# Patient Record
Sex: Female | Born: 1955
Health system: Southern US, Community
[De-identification: ages and names within clinical notes are randomized; demographics above are authoritative.]

## PROBLEM LIST (undated history)

## (undated) ENCOUNTER — Emergency Department: Discharge status: Absent without leave | Payer: Self-pay

## (undated) DIAGNOSIS — G43711 Chronic migraine without aura, intractable, with status migrainosus: Secondary | ICD-10-CM

## (undated) DIAGNOSIS — I447 Left bundle-branch block, unspecified: Secondary | ICD-10-CM

## (undated) DIAGNOSIS — I82409 Acute embolism and thrombosis of unspecified deep veins of unspecified lower extremity: Secondary | ICD-10-CM

## (undated) DIAGNOSIS — Z8489 Family history of other specified conditions: Secondary | ICD-10-CM

## (undated) DIAGNOSIS — IMO0001 Reserved for inherently not codable concepts without codable children: Secondary | ICD-10-CM

## (undated) DIAGNOSIS — G709 Myoneural disorder, unspecified: Secondary | ICD-10-CM

## (undated) DIAGNOSIS — I2692 Saddle embolus of pulmonary artery without acute cor pulmonale: Secondary | ICD-10-CM

## (undated) DIAGNOSIS — I10 Essential (primary) hypertension: Secondary | ICD-10-CM

## (undated) DIAGNOSIS — F419 Anxiety disorder, unspecified: Secondary | ICD-10-CM

## (undated) DIAGNOSIS — K649 Unspecified hemorrhoids: Secondary | ICD-10-CM

## (undated) DIAGNOSIS — F32A Depression, unspecified: Secondary | ICD-10-CM

## (undated) DIAGNOSIS — F329 Major depressive disorder, single episode, unspecified: Secondary | ICD-10-CM

## (undated) DIAGNOSIS — E669 Obesity, unspecified: Secondary | ICD-10-CM

## (undated) DIAGNOSIS — Z9889 Other specified postprocedural states: Secondary | ICD-10-CM

## (undated) DIAGNOSIS — T7840XA Allergy, unspecified, initial encounter: Secondary | ICD-10-CM

## (undated) DIAGNOSIS — Z9109 Other allergy status, other than to drugs and biological substances: Secondary | ICD-10-CM

## (undated) DIAGNOSIS — J449 Chronic obstructive pulmonary disease, unspecified: Secondary | ICD-10-CM

## (undated) DIAGNOSIS — J189 Pneumonia, unspecified organism: Secondary | ICD-10-CM

## (undated) DIAGNOSIS — Z5189 Encounter for other specified aftercare: Secondary | ICD-10-CM

## (undated) DIAGNOSIS — M1990 Unspecified osteoarthritis, unspecified site: Secondary | ICD-10-CM

## (undated) DIAGNOSIS — S0990XA Unspecified injury of head, initial encounter: Secondary | ICD-10-CM

## (undated) DIAGNOSIS — I5022 Chronic systolic (congestive) heart failure: Secondary | ICD-10-CM

## (undated) DIAGNOSIS — K219 Gastro-esophageal reflux disease without esophagitis: Secondary | ICD-10-CM

## (undated) DIAGNOSIS — R55 Syncope and collapse: Secondary | ICD-10-CM

## (undated) DIAGNOSIS — C50919 Malignant neoplasm of unspecified site of unspecified female breast: Secondary | ICD-10-CM

## (undated) DIAGNOSIS — C50912 Malignant neoplasm of unspecified site of left female breast: Secondary | ICD-10-CM

## (undated) DIAGNOSIS — I951 Orthostatic hypotension: Secondary | ICD-10-CM

## (undated) HISTORY — PX: APPENDECTOMY: SHX54

## (undated) HISTORY — PX: TUBAL LIGATION: SHX77

## (undated) HISTORY — DX: Chronic obstructive pulmonary disease, unspecified: J44.9

## (undated) HISTORY — DX: Chronic migraine without aura, intractable, with status migrainosus: G43.711

## (undated) HISTORY — DX: Unspecified osteoarthritis, unspecified site: M19.90

## (undated) HISTORY — DX: Myoneural disorder, unspecified: G70.9

## (undated) HISTORY — DX: Orthostatic hypotension: I95.1

## (undated) HISTORY — DX: Allergy, unspecified, initial encounter: T78.40XA

## (undated) HISTORY — PX: CARPAL TUNNEL RELEASE: SHX101

## (undated) HISTORY — PX: CHOLECYSTECTOMY: SHX55

## (undated) HISTORY — PX: KNEE CARTILAGE SURGERY: SHX688

## (undated) HISTORY — PX: TONSILLECTOMY: SUR1361

## (undated) HISTORY — DX: Syncope and collapse: R55

## (undated) HISTORY — DX: Other specified postprocedural states: Z98.890

## (undated) HISTORY — PX: ANAL FISSURE REPAIR: SHX2312

---

## 1977-09-05 DIAGNOSIS — IMO0001 Reserved for inherently not codable concepts without codable children: Secondary | ICD-10-CM

## 1977-09-05 DIAGNOSIS — Z5189 Encounter for other specified aftercare: Secondary | ICD-10-CM

## 1977-09-05 HISTORY — DX: Reserved for inherently not codable concepts without codable children: IMO0001

## 1977-09-05 HISTORY — DX: Encounter for other specified aftercare: Z51.89

## 1984-09-05 HISTORY — PX: ABDOMINAL HYSTERECTOMY: SHX81

## 1988-09-05 HISTORY — PX: TARSAL TUNNEL RELEASE: SUR1099

## 2006-08-30 ENCOUNTER — Ambulatory Visit: Payer: Self-pay | Admitting: Cardiovascular Disease

## 2007-01-31 ENCOUNTER — Emergency Department: Payer: Self-pay | Admitting: General Practice

## 2007-12-12 ENCOUNTER — Ambulatory Visit: Payer: Self-pay | Admitting: Family Medicine

## 2008-04-16 ENCOUNTER — Ambulatory Visit: Payer: Self-pay | Admitting: General Surgery

## 2008-04-21 ENCOUNTER — Ambulatory Visit: Payer: Self-pay | Admitting: General Surgery

## 2008-06-18 ENCOUNTER — Ambulatory Visit: Payer: Self-pay | Admitting: Oncology

## 2008-07-06 ENCOUNTER — Ambulatory Visit: Payer: Self-pay | Admitting: Oncology

## 2008-09-05 HISTORY — PX: CARDIAC CATHETERIZATION: SHX172

## 2008-12-09 ENCOUNTER — Ambulatory Visit: Payer: Self-pay | Admitting: General Surgery

## 2009-01-16 ENCOUNTER — Ambulatory Visit: Payer: Self-pay | Admitting: General Surgery

## 2009-01-16 HISTORY — PX: COLONOSCOPY: SHX174

## 2009-06-11 ENCOUNTER — Ambulatory Visit: Payer: Self-pay | Admitting: General Surgery

## 2009-08-14 ENCOUNTER — Ambulatory Visit: Payer: Self-pay | Admitting: General Surgery

## 2009-08-14 ENCOUNTER — Ambulatory Visit: Payer: Self-pay | Admitting: Internal Medicine

## 2009-08-24 ENCOUNTER — Ambulatory Visit: Payer: Self-pay | Admitting: General Surgery

## 2009-09-05 DIAGNOSIS — C50912 Malignant neoplasm of unspecified site of left female breast: Secondary | ICD-10-CM

## 2009-09-05 DIAGNOSIS — S0990XA Unspecified injury of head, initial encounter: Secondary | ICD-10-CM

## 2009-09-05 HISTORY — DX: Unspecified injury of head, initial encounter: S09.90XA

## 2009-09-05 HISTORY — DX: Malignant neoplasm of unspecified site of left female breast: C50.912

## 2009-09-05 HISTORY — PX: BREAST SURGERY: SHX581

## 2009-12-10 ENCOUNTER — Ambulatory Visit: Payer: Self-pay | Admitting: General Surgery

## 2010-02-22 ENCOUNTER — Ambulatory Visit: Payer: Self-pay | Admitting: Family Medicine

## 2010-09-05 DIAGNOSIS — J189 Pneumonia, unspecified organism: Secondary | ICD-10-CM

## 2010-09-05 HISTORY — DX: Pneumonia, unspecified organism: J18.9

## 2010-12-14 ENCOUNTER — Ambulatory Visit: Payer: Self-pay | Admitting: General Surgery

## 2011-05-12 ENCOUNTER — Emergency Department: Payer: Self-pay | Admitting: Emergency Medicine

## 2011-06-30 ENCOUNTER — Emergency Department (HOSPITAL_COMMUNITY)
Admission: EM | Admit: 2011-06-30 | Discharge: 2011-06-30 | Disposition: A | Discharge status: Home / Self Care | Payer: Worker's Compensation | Attending: Emergency Medicine | Admitting: Emergency Medicine

## 2011-06-30 ENCOUNTER — Emergency Department (HOSPITAL_COMMUNITY): Payer: Worker's Compensation

## 2011-06-30 DIAGNOSIS — F0781 Postconcussional syndrome: Secondary | ICD-10-CM | POA: Insufficient documentation

## 2011-06-30 DIAGNOSIS — Z79899 Other long term (current) drug therapy: Secondary | ICD-10-CM | POA: Insufficient documentation

## 2011-06-30 DIAGNOSIS — R51 Headache: Secondary | ICD-10-CM | POA: Insufficient documentation

## 2011-06-30 DIAGNOSIS — I10 Essential (primary) hypertension: Secondary | ICD-10-CM | POA: Insufficient documentation

## 2012-01-16 ENCOUNTER — Encounter (HOSPITAL_COMMUNITY): Payer: Self-pay | Admitting: Respiratory Therapy

## 2012-01-18 ENCOUNTER — Other Ambulatory Visit: Payer: Self-pay | Admitting: Neurosurgery

## 2012-01-19 ENCOUNTER — Encounter (HOSPITAL_COMMUNITY)
Admission: RE | Admit: 2012-01-19 | Discharge: 2012-01-19 | Disposition: A | Discharge status: Home / Self Care | Payer: Worker's Compensation | Source: Ambulatory Visit | Attending: Neurosurgery | Admitting: Neurosurgery

## 2012-01-19 ENCOUNTER — Encounter (HOSPITAL_COMMUNITY): Payer: Self-pay

## 2012-01-19 HISTORY — DX: Major depressive disorder, single episode, unspecified: F32.9

## 2012-01-19 HISTORY — DX: Encounter for other specified aftercare: Z51.89

## 2012-01-19 HISTORY — DX: Pneumonia, unspecified organism: J18.9

## 2012-01-19 HISTORY — DX: Other allergy status, other than to drugs and biological substances: Z91.09

## 2012-01-19 HISTORY — DX: Gastro-esophageal reflux disease without esophagitis: K21.9

## 2012-01-19 HISTORY — DX: Depression, unspecified: F32.A

## 2012-01-19 LAB — BASIC METABOLIC PANEL
BUN: 9 mg/dL (ref 6–23)
Creatinine, Ser: 0.72 mg/dL (ref 0.50–1.10)
GFR calc non Af Amer: >90 mL/min (ref >90)
Glucose, Bld: 114 mg/dL — ABNORMAL HIGH (ref 70–99)
Potassium: 3.5 mEq/L (ref 3.5–5.1)

## 2012-01-19 LAB — CBC
Hemoglobin: 13.7 g/dL (ref 12.0–15.0)
MCH: 28.8 pg (ref 26.0–34.0)
MCHC: 33.7 g/dL (ref 30.0–36.0)

## 2012-01-19 LAB — SURGICAL PCR SCREEN: MRSA, PCR: NEGATIVE

## 2012-01-19 NOTE — Pre-Procedure Instructions (Signed)
20 Patricia Fischer  01/19/2012   Your procedure is scheduled on:  Monday, May 20  Report to [facility] at 0530 AM.  Call this number if you have problems the morning of surgery: [phone]   Remember:   Do not eat food:After Midnight.  May have clear liquids: up to 4 Hours before arrival.( 1:30)  Clear liquids include soda, tea, black coffee, apple or grape juice, broth.  Take these medicines the morning of surgery with A SIP OF WATER: *Cymbalta,  Claritin,Oxycodone**   Do not wear jewelry, make-up or nail polish.  Do not wear lotions, powders, or perfumes. You may wear deodorant.  Do not shave 48 hours prior to surgery. Men may shave face and neck.  Do not bring valuables to the hospital.  Contacts, dentures or bridgework may not be worn into surgery.  Leave suitcase in the car. After surgery it may be brought to your room.  For patients admitted to the hospital, checkout time is 11:00 AM the day of discharge.   Patients discharged the day of surgery will not be allowed to drive home.  Name and phone number of your driver: *n/a**  Special Instructions: CHG Shower Use Special Wash: 1/2 bottle night before surgery and 1/2 bottle morning of surgery.   Please read over the following fact sheets that you were given: Pain Booklet, Coughing and Deep Breathing, MRSA Information and Surgical Site Infection Prevention

## 2012-01-20 NOTE — Progress Notes (Signed)
Spoke with and advised Darl Pikes of orders that need to be signed by  Dr Wynetta Emery.

## 2012-01-23 ENCOUNTER — Ambulatory Visit (HOSPITAL_COMMUNITY): Payer: Worker's Compensation | Admitting: Anesthesiology

## 2012-01-23 ENCOUNTER — Encounter (HOSPITAL_COMMUNITY): Payer: Self-pay | Admitting: Anesthesiology

## 2012-01-23 ENCOUNTER — Ambulatory Visit (HOSPITAL_COMMUNITY)
Admission: RE | Admit: 2012-01-23 | Discharge: 2012-01-24 | Disposition: A | Discharge status: Home / Self Care | Payer: Worker's Compensation | Source: Ambulatory Visit | Attending: Neurosurgery | Admitting: Neurosurgery

## 2012-01-23 ENCOUNTER — Encounter (HOSPITAL_COMMUNITY)
Admission: RE | Disposition: A | Discharge status: Home / Self Care | Payer: Self-pay | Source: Ambulatory Visit | Attending: Neurosurgery

## 2012-01-23 ENCOUNTER — Encounter (HOSPITAL_COMMUNITY): Payer: Self-pay | Admitting: *Deleted

## 2012-01-23 ENCOUNTER — Ambulatory Visit (HOSPITAL_COMMUNITY): Payer: Worker's Compensation

## 2012-01-23 DIAGNOSIS — Z01810 Encounter for preprocedural cardiovascular examination: Secondary | ICD-10-CM | POA: Insufficient documentation

## 2012-01-23 DIAGNOSIS — J45909 Unspecified asthma, uncomplicated: Secondary | ICD-10-CM | POA: Insufficient documentation

## 2012-01-23 DIAGNOSIS — T148XXS Other injury of unspecified body region, sequela: Secondary | ICD-10-CM | POA: Insufficient documentation

## 2012-01-23 DIAGNOSIS — Z01818 Encounter for other preprocedural examination: Secondary | ICD-10-CM | POA: Insufficient documentation

## 2012-01-23 DIAGNOSIS — M47812 Spondylosis without myelopathy or radiculopathy, cervical region: Principal | ICD-10-CM | POA: Insufficient documentation

## 2012-01-23 DIAGNOSIS — IMO0002 Reserved for concepts with insufficient information to code with codable children: Secondary | ICD-10-CM | POA: Insufficient documentation

## 2012-01-23 DIAGNOSIS — Y9269 Other specified industrial and construction area as the place of occurrence of the external cause: Secondary | ICD-10-CM | POA: Insufficient documentation

## 2012-01-23 DIAGNOSIS — M5020 Other cervical disc displacement, unspecified cervical region: Secondary | ICD-10-CM | POA: Insufficient documentation

## 2012-01-23 DIAGNOSIS — X58XXXS Exposure to other specified factors, sequela: Secondary | ICD-10-CM | POA: Insufficient documentation

## 2012-01-23 DIAGNOSIS — Z01812 Encounter for preprocedural laboratory examination: Secondary | ICD-10-CM | POA: Insufficient documentation

## 2012-01-23 DIAGNOSIS — I10 Essential (primary) hypertension: Secondary | ICD-10-CM | POA: Insufficient documentation

## 2012-01-23 HISTORY — PX: ANTERIOR CERVICAL DECOMP/DISCECTOMY FUSION: SHX1161

## 2012-01-23 SURGERY — ANTERIOR CERVICAL DECOMPRESSION/DISCECTOMY FUSION 1 LEVEL
Anesthesia: General | Site: Neck | Wound class: Clean

## 2012-01-23 MED ORDER — ALBUTEROL SULFATE HFA 108 (90 BASE) MCG/ACT IN AERS
2.0000 | INHALATION_SPRAY | Freq: Four times a day (QID) | RESPIRATORY_TRACT | Status: DC | PRN
  Filled 2012-01-23: qty 6.7

## 2012-01-23 MED ORDER — OXYCODONE HCL 5 MG PO TABS
5.0000 mg | ORAL_TABLET | ORAL | Status: DC | PRN
  Administered 2012-01-23 – 2012-01-24 (×5): 5 mg via ORAL
  Filled 2012-01-23 (×5): qty 1

## 2012-01-23 MED ORDER — ACETAMINOPHEN 325 MG PO TABS
650.0000 mg | ORAL_TABLET | ORAL | Status: DC | PRN
  Administered 2012-01-24: 650 mg via ORAL
  Filled 2012-01-23: qty 2

## 2012-01-23 MED ORDER — ACETAMINOPHEN 650 MG RE SUPP: 650.0000 mg | RECTAL | Status: DC | PRN

## 2012-01-23 MED ORDER — HEMOSTATIC AGENTS (NO CHARGE) OPTIME
TOPICAL | Status: DC | PRN
  Administered 2012-01-23: 1 via TOPICAL

## 2012-01-23 MED ORDER — DEXTROSE 5 % IV SOLN
INTRAVENOUS | Status: DC | PRN
  Administered 2012-01-23: 08:00:00 via INTRAVENOUS

## 2012-01-23 MED ORDER — VANCOMYCIN HCL 1000 MG IV SOLR
1000.0000 mg | INTRAVENOUS | Status: DC | PRN
  Administered 2012-01-23: 1000 mg via INTRAVENOUS

## 2012-01-23 MED ORDER — CYCLOBENZAPRINE HCL 10 MG PO TABS: 5.0000 mg | ORAL_TABLET | Freq: Three times a day (TID) | ORAL | Status: DC | PRN

## 2012-01-23 MED ORDER — ALBUTEROL SULFATE HFA 108 (90 BASE) MCG/ACT IN AERS
INHALATION_SPRAY | RESPIRATORY_TRACT | Status: DC | PRN
  Administered 2012-01-23: 4 via RESPIRATORY_TRACT

## 2012-01-23 MED ORDER — LOSARTAN POTASSIUM-HCTZ 100-25 MG PO TABS: 1.0000 | ORAL_TABLET | Freq: Every day | ORAL | Status: DC

## 2012-01-23 MED ORDER — DEXAMETHASONE SODIUM PHOSPHATE 10 MG/ML IJ SOLN
INTRAMUSCULAR | Status: AC
  Administered 2012-01-23: 10 mg
  Filled 2012-01-23: qty 1

## 2012-01-23 MED ORDER — LIDOCAINE HCL 4 % MT SOLN
OROMUCOSAL | Status: DC | PRN
  Administered 2012-01-23: 4 mL via TOPICAL

## 2012-01-23 MED ORDER — METOCLOPRAMIDE HCL 5 MG/ML IJ SOLN
10.0000 mg | Freq: Once | INTRAMUSCULAR | Status: DC | PRN
  Filled 2012-01-23: qty 2

## 2012-01-23 MED ORDER — MIDAZOLAM HCL 5 MG/5ML IJ SOLN
INTRAMUSCULAR | Status: DC | PRN
  Administered 2012-01-23: 2 mg via INTRAVENOUS

## 2012-01-23 MED ORDER — FENTANYL CITRATE 0.05 MG/ML IJ SOLN
INTRAMUSCULAR | Status: DC | PRN
  Administered 2012-01-23 (×3): 50 ug via INTRAVENOUS
  Administered 2012-01-23: 100 ug via INTRAVENOUS

## 2012-01-23 MED ORDER — PHENYLEPHRINE HCL 10 MG/ML IJ SOLN
INTRAMUSCULAR | Status: DC | PRN
  Administered 2012-01-23: 120 ug via INTRAVENOUS
  Administered 2012-01-23: 80 ug via INTRAVENOUS

## 2012-01-23 MED ORDER — PROPOFOL 10 MG/ML IV EMUL
INTRAVENOUS | Status: DC | PRN
  Administered 2012-01-23: 160 mg via INTRAVENOUS

## 2012-01-23 MED ORDER — OXYCODONE-ACETAMINOPHEN 5-325 MG PO TABS
1.0000 | ORAL_TABLET | ORAL | Status: DC | PRN
  Administered 2012-01-23 – 2012-01-24 (×4): 1 via ORAL
  Filled 2012-01-23 (×4): qty 1

## 2012-01-23 MED ORDER — ONDANSETRON HCL 4 MG/2ML IJ SOLN: 4.0000 mg | INTRAMUSCULAR | Status: DC | PRN

## 2012-01-23 MED ORDER — LORATADINE 10 MG PO TABS
10.0000 mg | ORAL_TABLET | Freq: Every day | ORAL | Status: DC
  Administered 2012-01-24: 10 mg via ORAL
  Filled 2012-01-23: qty 1

## 2012-01-23 MED ORDER — VANCOMYCIN HCL IN DEXTROSE 1-5 GM/200ML-% IV SOLN
1000.0000 mg | Freq: Two times a day (BID) | INTRAVENOUS | Status: AC
  Administered 2012-01-23 – 2012-01-24 (×2): 1000 mg via INTRAVENOUS
  Filled 2012-01-23 (×2): qty 200

## 2012-01-23 MED ORDER — DEXAMETHASONE SODIUM PHOSPHATE 4 MG/ML IJ SOLN
INTRAMUSCULAR | Status: DC | PRN
  Administered 2012-01-23: 12 mg via INTRAVENOUS

## 2012-01-23 MED ORDER — SODIUM CHLORIDE 0.9 % IV SOLN
INTRAVENOUS | Status: AC
  Filled 2012-01-23: qty 500

## 2012-01-23 MED ORDER — CYCLOBENZAPRINE HCL 10 MG PO TABS: 10.0000 mg | ORAL_TABLET | Freq: Three times a day (TID) | ORAL | Status: DC | PRN

## 2012-01-23 MED ORDER — LORATADINE-PSEUDOEPHEDRINE ER 10-240 MG PO TB24: 1.0000 | ORAL_TABLET | Freq: Every day | ORAL | Status: DC

## 2012-01-23 MED ORDER — DEXAMETHASONE SODIUM PHOSPHATE 10 MG/ML IJ SOLN: 10.0000 mg | Freq: Once | INTRAMUSCULAR | Status: DC

## 2012-01-23 MED ORDER — HYDROMORPHONE HCL PF 1 MG/ML IJ SOLN
INTRAMUSCULAR | Status: AC
  Filled 2012-01-23: qty 1

## 2012-01-23 MED ORDER — ONDANSETRON HCL 4 MG/2ML IJ SOLN
INTRAMUSCULAR | Status: DC | PRN
  Administered 2012-01-23: 4 mg via INTRAVENOUS

## 2012-01-23 MED ORDER — MENTHOL 3 MG MT LOZG: 1.0000 | LOZENGE | OROMUCOSAL | Status: DC | PRN

## 2012-01-23 MED ORDER — MEPERIDINE HCL 25 MG/ML IJ SOLN: 6.2500 mg | INTRAMUSCULAR | Status: DC | PRN

## 2012-01-23 MED ORDER — HYDROMORPHONE HCL PF 1 MG/ML IJ SOLN
0.2500 mg | INTRAMUSCULAR | Status: DC | PRN
  Administered 2012-01-23 (×2): 0.25 mg via INTRAVENOUS

## 2012-01-23 MED ORDER — GLYCOPYRROLATE 0.2 MG/ML IJ SOLN
INTRAMUSCULAR | Status: DC | PRN
  Administered 2012-01-23: 0.2 mg via INTRAVENOUS
  Administered 2012-01-23: 0.4 mg via INTRAVENOUS

## 2012-01-23 MED ORDER — NEOSTIGMINE METHYLSULFATE 1 MG/ML IJ SOLN
INTRAMUSCULAR | Status: DC | PRN
  Administered 2012-01-23: 3 mg via INTRAVENOUS

## 2012-01-23 MED ORDER — LACTATED RINGERS IV SOLN
INTRAVENOUS | Status: DC | PRN
  Administered 2012-01-23 (×2): via INTRAVENOUS

## 2012-01-23 MED ORDER — DIPHENHYDRAMINE HCL 25 MG PO CAPS
25.0000 mg | ORAL_CAPSULE | Freq: Four times a day (QID) | ORAL | Status: DC | PRN
  Administered 2012-01-23 – 2012-01-24 (×4): 25 mg via ORAL
  Filled 2012-01-23 (×4): qty 1

## 2012-01-23 MED ORDER — DULOXETINE HCL 60 MG PO CPEP
60.0000 mg | ORAL_CAPSULE | Freq: Every day | ORAL | Status: DC
  Administered 2012-01-24: 60 mg via ORAL
  Filled 2012-01-23 (×2): qty 1

## 2012-01-23 MED ORDER — LIDOCAINE HCL (CARDIAC) 20 MG/ML IV SOLN
INTRAVENOUS | Status: DC | PRN
  Administered 2012-01-23: 80 mg via INTRAVENOUS

## 2012-01-23 MED ORDER — SODIUM CHLORIDE 0.9 % IR SOLN
Status: DC | PRN
  Administered 2012-01-23: 09:00:00

## 2012-01-23 MED ORDER — HYDROMORPHONE HCL PF 1 MG/ML IJ SOLN
0.5000 mg | INTRAMUSCULAR | Status: DC | PRN
  Administered 2012-01-23 (×2): 1 mg via INTRAVENOUS
  Filled 2012-01-23 (×2): qty 1

## 2012-01-23 MED ORDER — LOSARTAN POTASSIUM 50 MG PO TABS
100.0000 mg | ORAL_TABLET | Freq: Every day | ORAL | Status: DC
  Administered 2012-01-24: 100 mg via ORAL
  Filled 2012-01-23: qty 2

## 2012-01-23 MED ORDER — SODIUM CHLORIDE 0.9 % IJ SOLN
3.0000 mL | Freq: Two times a day (BID) | INTRAMUSCULAR | Status: DC
  Administered 2012-01-23: 3 mL via INTRAVENOUS

## 2012-01-23 MED ORDER — VANCOMYCIN HCL IN DEXTROSE 1-5 GM/200ML-% IV SOLN
INTRAVENOUS | Status: AC
  Filled 2012-01-23: qty 200

## 2012-01-23 MED ORDER — OXYCODONE-ACETAMINOPHEN 10-325 MG PO TABS: 1.0000 | ORAL_TABLET | ORAL | Status: DC | PRN

## 2012-01-23 MED ORDER — HYDROMORPHONE HCL PF 1 MG/ML IJ SOLN
INTRAMUSCULAR | Status: AC
  Administered 2012-01-23: 12:00:00
  Filled 2012-01-23: qty 1

## 2012-01-23 MED ORDER — ARTIFICIAL TEARS OP OINT
TOPICAL_OINTMENT | OPHTHALMIC | Status: DC | PRN
  Administered 2012-01-23: 1 via OPHTHALMIC

## 2012-01-23 MED ORDER — BACITRACIN 50000 UNITS IM SOLR
INTRAMUSCULAR | Status: AC
  Filled 2012-01-23: qty 1

## 2012-01-23 MED ORDER — CEFAZOLIN SODIUM 1-5 GM-% IV SOLN: 1.0000 g | Freq: Three times a day (TID) | INTRAVENOUS | Status: DC

## 2012-01-23 MED ORDER — ROCURONIUM BROMIDE 100 MG/10ML IV SOLN
INTRAVENOUS | Status: DC | PRN
  Administered 2012-01-23: 40 mg via INTRAVENOUS

## 2012-01-23 MED ORDER — THROMBIN 5000 UNITS EX SOLR
OROMUCOSAL | Status: DC | PRN
  Administered 2012-01-23: 09:00:00 via TOPICAL

## 2012-01-23 MED ORDER — 0.9 % SODIUM CHLORIDE (POUR BTL) OPTIME
TOPICAL | Status: DC | PRN
  Administered 2012-01-23: 1000 mL

## 2012-01-23 MED ORDER — THROMBIN 5000 UNITS EX KIT
PACK | CUTANEOUS | Status: DC | PRN
  Administered 2012-01-23 (×2): 5000 [IU] via TOPICAL

## 2012-01-23 MED ORDER — PHENOL 1.4 % MT LIQD: 1.0000 | OROMUCOSAL | Status: DC | PRN

## 2012-01-23 MED ORDER — HYDROCHLOROTHIAZIDE 25 MG PO TABS
25.0000 mg | ORAL_TABLET | Freq: Every day | ORAL | Status: DC
  Administered 2012-01-24: 25 mg via ORAL
  Filled 2012-01-23: qty 1

## 2012-01-23 SURGICAL SUPPLY — 73 items
BAG DECANTER FOR FLEXI CONT (MISCELLANEOUS) ×2 IMPLANT
BENZOIN TINCTURE PRP APPL 2/3 (GAUZE/BANDAGES/DRESSINGS) ×2 IMPLANT
BIT DRILL SPINE QC 12 (BIT) ×2 IMPLANT
BRUSH SCRUB EZ PLAIN DRY (MISCELLANEOUS) ×2 IMPLANT
BUR MATCHSTICK NEURO 3.0 LAGG (BURR) ×2 IMPLANT
CANISTER SUCTION 2500CC (MISCELLANEOUS) ×2 IMPLANT
CLOTH BEACON ORANGE TIMEOUT ST (SAFETY) ×2 IMPLANT
CONT SPEC 4OZ CLIKSEAL STRL BL (MISCELLANEOUS) ×2 IMPLANT
DECANTER SPIKE VIAL GLASS SM (MISCELLANEOUS) IMPLANT
DERMABOND ADVANCED (GAUZE/BANDAGES/DRESSINGS) ×1
DERMABOND ADVANCED .7 DNX12 (GAUZE/BANDAGES/DRESSINGS) ×1 IMPLANT
DRAPE C-ARM 42X72 X-RAY (DRAPES) ×4 IMPLANT
DRAPE LAPAROTOMY 100X72 PEDS (DRAPES) ×2 IMPLANT
DRAPE MICROSCOPE ZEISS OPMI (DRAPES) IMPLANT
DRAPE POUCH INSTRU U-SHP 10X18 (DRAPES) ×2 IMPLANT
DRSG OPSITE 4X5.5 SM (GAUZE/BANDAGES/DRESSINGS) ×2 IMPLANT
ELECT COATED BLADE 2.86 ST (ELECTRODE) ×2 IMPLANT
ELECT REM PT RETURN 9FT ADLT (ELECTROSURGICAL) ×2
ELECTRODE REM PT RTRN 9FT ADLT (ELECTROSURGICAL) ×1 IMPLANT
GAUZE SPONGE 4X4 16PLY XRAY LF (GAUZE/BANDAGES/DRESSINGS) IMPLANT
GLOVE BIO SURGEON STRL SZ 6.5 (GLOVE) IMPLANT
GLOVE BIO SURGEON STRL SZ7 (GLOVE) IMPLANT
GLOVE BIO SURGEON STRL SZ7.5 (GLOVE) IMPLANT
GLOVE BIO SURGEON STRL SZ8 (GLOVE) ×2 IMPLANT
GLOVE BIO SURGEON STRL SZ8.5 (GLOVE) IMPLANT
GLOVE BIOGEL M 8.0 STRL (GLOVE) IMPLANT
GLOVE BIOGEL PI IND STRL 8 (GLOVE) ×1 IMPLANT
GLOVE BIOGEL PI INDICATOR 8 (GLOVE) ×1
GLOVE ECLIPSE 6.5 STRL STRAW (GLOVE) IMPLANT
GLOVE ECLIPSE 7.0 STRL STRAW (GLOVE) IMPLANT
GLOVE ECLIPSE 7.5 STRL STRAW (GLOVE) ×4 IMPLANT
GLOVE ECLIPSE 8.0 STRL XLNG CF (GLOVE) IMPLANT
GLOVE ECLIPSE 8.5 STRL (GLOVE) IMPLANT
GLOVE EXAM NITRILE LRG STRL (GLOVE) IMPLANT
GLOVE EXAM NITRILE MD LF STRL (GLOVE) ×2 IMPLANT
GLOVE EXAM NITRILE XL STR (GLOVE) IMPLANT
GLOVE EXAM NITRILE XS STR PU (GLOVE) IMPLANT
GLOVE INDICATOR 6.5 STRL GRN (GLOVE) IMPLANT
GLOVE INDICATOR 7.0 STRL GRN (GLOVE) IMPLANT
GLOVE INDICATOR 7.5 STRL GRN (GLOVE) IMPLANT
GLOVE INDICATOR 8.0 STRL GRN (GLOVE) IMPLANT
GLOVE INDICATOR 8.5 STRL (GLOVE) ×2 IMPLANT
GLOVE OPTIFIT SS 8.0 STRL (GLOVE) IMPLANT
GLOVE SURG SS PI 6.5 STRL IVOR (GLOVE) IMPLANT
GOWN BRE IMP SLV AUR LG STRL (GOWN DISPOSABLE) IMPLANT
GOWN BRE IMP SLV AUR XL STRL (GOWN DISPOSABLE) ×4 IMPLANT
GOWN STRL REIN 2XL LVL4 (GOWN DISPOSABLE) ×2 IMPLANT
HEAD HALTER (SOFTGOODS) ×2 IMPLANT
HEMOSTAT POWDER KIT SURGIFOAM (HEMOSTASIS) ×2 IMPLANT
IMPLT CONSTRUX LORDO 15X12X8MM (Orthopedic Implant) ×2 IMPLANT
KIT BASIN OR (CUSTOM PROCEDURE TRAY) ×2 IMPLANT
KIT ROOM TURNOVER OR (KITS) ×2 IMPLANT
NEEDLE HYPO 18GX1.5 BLUNT FILL (NEEDLE) IMPLANT
NEEDLE SPNL 20GX3.5 QUINCKE YW (NEEDLE) ×2 IMPLANT
NS IRRIG 1000ML POUR BTL (IV SOLUTION) ×2 IMPLANT
PACK LAMINECTOMY NEURO (CUSTOM PROCEDURE TRAY) ×2 IMPLANT
PAD ARMBOARD 7.5X6 YLW CONV (MISCELLANEOUS) ×6 IMPLANT
PLATE ANT CERV XTEND 1 LV 16 (Plate) ×2 IMPLANT
PUTTY BONE DBX 2.5 MIS (Bone Implant) ×2 IMPLANT
RUBBERBAND STERILE (MISCELLANEOUS) ×4 IMPLANT
SCREW XTD VAR 4.2 SELF TAP 12 (Screw) ×8 IMPLANT
SPONGE GAUZE 4X4 12PLY (GAUZE/BANDAGES/DRESSINGS) ×2 IMPLANT
SPONGE INTESTINAL PEANUT (DISPOSABLE) ×2 IMPLANT
SPONGE SURGIFOAM ABS GEL SZ50 (HEMOSTASIS) ×2 IMPLANT
STRIP CLOSURE SKIN 1/2X4 (GAUZE/BANDAGES/DRESSINGS) ×2 IMPLANT
SUT VIC AB 3-0 SH 8-18 (SUTURE) ×2 IMPLANT
SUT VICRYL 4-0 PS2 18IN ABS (SUTURE) ×2 IMPLANT
SYR 20ML ECCENTRIC (SYRINGE) ×2 IMPLANT
TAPE CLOTH 4X10 WHT NS (GAUZE/BANDAGES/DRESSINGS) ×2 IMPLANT
TOWEL OR 17X24 6PK STRL BLUE (TOWEL DISPOSABLE) ×2 IMPLANT
TOWEL OR 17X26 10 PK STRL BLUE (TOWEL DISPOSABLE) ×2 IMPLANT
TRAP SPECIMEN MUCOUS 40CC (MISCELLANEOUS) ×2 IMPLANT
WATER STERILE IRR 1000ML POUR (IV SOLUTION) ×2 IMPLANT

## 2012-01-23 NOTE — Progress Notes (Signed)
Reason for her itching is unknown, she told me some times it happens at home too.

## 2012-01-23 NOTE — Anesthesia Preprocedure Evaluation (Addendum)
Anesthesia Evaluation  Patient identified by MRN, date of birth, ID band Patient awake    Reviewed: Allergy & Precautions, H&P , NPO status , Patient's Chart, lab work & pertinent test results  Airway Mallampati: III TM Distance: >3 FB Neck ROM: Full    Dental No notable dental hx. (+) Dental Advisory Given   Pulmonary asthma ,  breath sounds clear to auscultation  Pulmonary exam normal       Cardiovascular hypertension, Pt. on medications Rhythm:Regular Rate:Normal     Neuro/Psych  Headaches,    GI/Hepatic Neg liver ROS, GERD-  Controlled,  Endo/Other  negative endocrine ROS  Renal/GU negative Renal ROS  negative genitourinary   Musculoskeletal negative musculoskeletal ROS (+)   Abdominal (+) + obese,   Peds  Hematology negative hematology ROS (+)   Anesthesia Other Findings   Reproductive/Obstetrics negative OB ROS                          Anesthesia Physical Anesthesia Plan  ASA: III  Anesthesia Plan: General   Post-op Pain Management:    Induction: Intravenous  Airway Management Planned: Oral ETT  Additional Equipment:   Intra-op Plan:   Post-operative Plan: Extubation in OR  Informed Consent: I have reviewed the patients History and Physical, chart, labs and discussed the procedure including the risks, benefits and alternatives for the proposed anesthesia with the patient or authorized representative who has indicated his/her understanding and acceptance.   Dental advisory given  Plan Discussed with: Anesthesiologist, CRNA and Surgeon  Anesthesia Plan Comments:         Anesthesia Quick Evaluation

## 2012-01-23 NOTE — Anesthesia Procedure Notes (Signed)
Procedure Name: Intubation Date/Time: 01/23/2012 7:51 AM Performed by: Fransisca Kaufmann Pre-anesthesia Checklist: Patient identified, Emergency Drugs available, Suction available, Patient being monitored and Timeout performed Oxygen Delivery Method: Circle system utilized Preoxygenation: Pre-oxygenation with 100% oxygen Intubation Type: IV induction Ventilation: Mask ventilation without difficulty Laryngoscope Size: Miller and 3 Grade View: Grade III Tube type: Oral Tube size: 7.5 mm Number of attempts: 1 Airway Equipment and Method: Stylet Placement Confirmation: ETT inserted through vocal cords under direct vision,  positive ETCO2,  CO2 detector and breath sounds checked- equal and bilateral Secured at: 21 cm Tube secured with: Tape Dental Injury: Teeth and Oropharynx as per pre-operative assessment

## 2012-01-23 NOTE — Anesthesia Postprocedure Evaluation (Signed)
  Anesthesia Post-op Note  Patient: Patricia Fischer  Procedure(s) Performed: Procedure(s) (LRB): ANTERIOR CERVICAL DECOMPRESSION/DISCECTOMY FUSION 1 LEVEL (N/A)  Patient Location: PACU  Anesthesia Type: General  Level of Consciousness: awake, alert  and oriented  Airway and Oxygen Therapy: Patient Spontanous Breathing and Patient connected to nasal cannula oxygen  Post-op Pain: none  Post-op Assessment: Post-op Vital signs reviewed, Patient's Cardiovascular Status Stable, Respiratory Function Stable, Patent Airway, No signs of Nausea or vomiting and Pain level controlled. Patient had a couple of episodes of desaturation on emergence related to probable OSA. Patient to be admitted to neuro floor for continuous pulse oximetry monitoring. Patient informed of probable OSA and encouraged to get Sleep study.  Post-op Vital Signs: Reviewed and stable  Complications: No apparent anesthesia complications

## 2012-01-23 NOTE — Transfer of Care (Signed)
Immediate Anesthesia Transfer of Care Note  Patient: Patricia Fischer  Procedure(s) Performed: Procedure(s) (LRB): ANTERIOR CERVICAL DECOMPRESSION/DISCECTOMY FUSION 1 LEVEL (N/A)  Patient Location: PACU  Anesthesia Type: General  Level of Consciousness: awake, alert , oriented and sedated  Airway & Oxygen Therapy: Patient Spontanous Breathing and Patient connected to face mask oxygen  Post-op Assessment: Report given to PACU RN, Post -op Vital signs reviewed and stable and Patient moving all extremities  Post vital signs: Reviewed and stable  Complications: No apparent anesthesia complications

## 2012-01-23 NOTE — Progress Notes (Signed)
Patricia Fischer got up from her nap with itching all over body at around 12:35 pm. Could find an exact reason for it. Received  the order for Benadryl 25mg , from Dr.Cram, gave it to her. Then noticed her sound was getting soft and low, breathing was fine. But suddenly she started to have wheezing @ 13:00, kept her head elevated with O2 4lt  and, called rapid response nurse and doctor on call. Within few minutes she came back to normal. Dr Jordan Likes checked and advised to sit at the side of the bed for some time. She feels much better.

## 2012-01-23 NOTE — H&P (Signed)
Patricia Fischer is an 56 y.o. female.   Chief Complaint: Neck and right shoulder and arm pain HPI: Patient is a 56 year old female who sustained a work-related injury where she was hit struck the top of the head with a metal cabinet and this happened at approximately 2 years ago ongoing headaches 9 neck pain with radiation to her right shoulder and numbness tingling in her right hand and her thumb and forefinger consistent with a C6 nerve root pattern. She denies any significant left upper extremity pain is been refractory to all forms of conservative treatment with anti-inflammatories physical therapy and steroids workup revealed significant disc herniation C5-6 with angulation and kyphosis as well as a lesion in the posterior aspect her spinal cord up around the C3 area however at the most her clinical symptomatology is related to a disc herniation Toothaker surgery and progression of clinical syndrome and imaging findings have recommended an ACDF at C5-6. I have extensively reviewed the risks and benefits of the operation as well as perioperative course and expectations of outcome and alternatives of surgery she understands and agrees to proceed forward.  I do not think the lesion on the back of her spinal cord needs to be addressed and plan followup MRI scan in approximately 6 months.  Past Medical History  Diagnosis Date  . Hypertension   . Headache   . Asthma     uses MDI prn; no probems x 1 year  . Environmental allergies   . Pneumonia 2012  . Blood transfusion 1979  . GERD (gastroesophageal reflux disease)   . Cancer 2011    left breast  . Depression     Past Surgical History  Procedure Date  . Breast surgery 2011    left breast lumpectomy  . Cardiac catheterization 2010    [facility]; pt states it was "clear"  . Tonsillectomy   . Appendectomy   . Cholecystectomy   . Abdominal hysterectomy   . Tubal ligation   . Anal fissure repair   . Knee cartilage surgery   .  Carpal tunnel release     left hand  . Tarsal tunnel release 1990    left foot    Family History  Problem Relation Age of Onset  . Anesthesia problems Neg Hx    Social History:  reports that she has never smoked. She has never used smokeless tobacco. She reports that she drinks alcohol. She reports that she does not use illicit drugs.  Allergies:  Allergies  Allergen Reactions  . Codeine Itching  . Penicillins Hives  . Sulfa Antibiotics Other (See Comments)    Kidney infection    Medications Prior to Admission  Medication Sig Dispense Refill  . albuterol (PROVENTIL HFA;VENTOLIN HFA) 108 (90 BASE) MCG/ACT inhaler Inhale 2 puffs into the lungs every 6 (six) hours as needed.      . cyclobenzaprine (FLEXERIL) 5 MG tablet Take 5 mg by mouth 3 (three) times daily as needed. For muscle pain      . DULoxetine (CYMBALTA) 60 MG capsule Take 60 mg by mouth daily.      Marland Kitchen loratadine-pseudoephedrine (CLARITIN-D 24-HOUR) 10-240 MG per 24 hr tablet Take 1 tablet by mouth daily.      Marland Kitchen losartan-hydrochlorothiazide (HYZAAR) 100-25 MG per tablet Take 1 tablet by mouth daily.      Marland Kitchen oxyCODONE-acetaminophen (PERCOCET) 10-325 MG per tablet Take 1 tablet by mouth every 4 (four) hours as needed.        No results  found for this or any previous visit (from the past 48 hour(s)). No results found.  Review of Systems  Constitutional: Negative.   HENT: Positive for neck pain.   Eyes: Negative.   Cardiovascular: Negative.   Gastrointestinal: Negative.   Genitourinary: Negative.   Musculoskeletal: Positive for joint pain.  Skin: Negative.   Neurological: Positive for sensory change and headaches.  Endo/Heme/Allergies: Negative.     Blood pressure 124/82, temperature 97.5 F (36.4 C), temperature source Oral, resp. rate 20, SpO2 94.00%. Physical Exam  Constitutional: She is oriented to person, place, and time. She appears well-developed and well-nourished.  HENT:  Head: Normocephalic and  atraumatic.  Eyes: EOM are normal. Pupils are equal, round, and reactive to light.  Cardiovascular: Normal rate.   Respiratory: Breath sounds normal.  GI: Soft.  Neurological: She is alert and oriented to person, place, and time. GCS eye subscore is 4. GCS verbal subscore is 5. GCS motor subscore is 6.  Reflex Scores:      Tricep reflexes are 1+ on the right side and 1+ on the left side.      Bicep reflexes are 1+ on the right side and 1+ on the left side.      Brachioradialis reflexes are 1+ on the right side and 1+ on the left side.      Patellar reflexes are 1+ on the right side and 1+ on the left side.      Achilles reflexes are 1+ on the right side and 1+ on the left side.      Strength is 5 out of 5 in her deltoids of the right approximate has weakness in the right tricep 4/5 biceps 4+ out of 5 wrist flexion extension and intrinsics are all 5 out of 5 in her left results of the 5 lower extremities her also 5 out of 5     Assessment/Plan 56 year ago and presents for ACDF at [facility] risks and benefits of the operation have been explained to The patient as well as perioperative course and expectations of outcome and alternatives of surgery.  Paulene Tayag P 01/23/2012, 7:14 AM

## 2012-01-23 NOTE — Preoperative (Signed)
Beta Blockers   Reason not to administer Beta Blockers:Not Applicable 

## 2012-01-23 NOTE — Op Note (Signed)
Preoperative diagnosis: Cervical spondylosis with herniated nucleus pulposus C5-6 right-sided C6 radicular pain  Postoperative diagnosis: Same  Procedure: Anterior cervical discectomy and fusion C5-6 using the orthopedics peek cage packed with local autograft mixed with DBX and the globus extend plating system 16mm plate with [id number] variable angle screws  Surgeon: Jillyn Hidden Taniela Feltus  Anesthesia: Gen.  EBL: Minimal  History of present illness: Patient is a 56 year female who sustained a work-related injury starting her on top of the head eyes imaging revealed herniated nucleus pulposus the patient has had progressive worsening neck pain headaches and pain rate in her right arm to her thumb and forefinger consistent with C6 nerve root pattern. Imaging revealed spinal cord compression and diaphragm stenosis worse in the right and patient failed all forms of conservative treatment she was recommended anterior cervical discectomy fusion at C5-6 with a wrist nevus of the operation with her as well as peri-operative course and expectations of outcome and alternatives to surgery. She is still these and agreed to proceed forward.  Operative procedure: Patient brought into the or was induced under general anesthesia positioned supine the neck in slight extension in 5 pounds of halter traction the rightside of her neck was prepped  And draped in routine sterile fashion. Preoperative x-ray localize the appropriate levels so a curvilinear incision was made just off midline to the anterior body of the sternocleidomastoid. The subcutaneous tissues and dissected free and the platysma was divided longitudinally the avascular peritoneum between the sternomastoid and strap as was developed and the provided fashion. Impression doesn't with Kitners interoperative x-ray identified the appropriate level just below the disc space marked at C4-5. Annulotomy was made with the scalpel marked the disc space a and lungs close was  reflected laterally and self-retaining retractor was placed. Large anterior aspect of did not flexor rongeur and a 3 minute Kerrison punch. Disc space is then drilled down capturing the bone shavings in a mucous trap then the posterior length and longitudinal ligament and the posterior aspect complex. Under microscopic illumination the undersurface of both endplates were removed PLL was removed in piecemeal fashion exposing the thecal sac March across the right there is a couple large subligamentous disc herniations are removed and the right C6 pedicle was identified the right C6 nerve roots, social pedicle. This procedure was carried along to the left side in a similar fashion the left C6 nerve was identified and the left C6 pedicle was decompressed flush with pedicle. There is Kitners no further stenosis endplates were scraped with a BA curette a prepped to receive the peak cage an 8 mm peek cages and packed with local autograft mixed with DBX and inserted 2 mm deep to the anterior vertebral body line the 60 mm plate was placed all screws excellent purchase locking mechanism was gauged the wounds and copious irrigated meticulous hemostasis was maintained and the platysmas reapproximated after Vicryl and skin was closed running 4 subcuticular benzo and Steri-Strips were applied patient recovered in stable condition at the end of case on it counts sponge counts were correct.

## 2012-01-24 ENCOUNTER — Encounter (HOSPITAL_COMMUNITY): Payer: Self-pay

## 2012-01-24 MED ORDER — OXYCODONE-ACETAMINOPHEN 10-325 MG PO TABS: 1.0000 | ORAL_TABLET | ORAL | Status: DC | PRN

## 2012-01-24 NOTE — Progress Notes (Signed)
Pt called RN to room around 0115 complaining of difficulty breathing. Pt sitting up in bed, gasping for breath. 2L 02 applied, pt encouraged to deep breathe. Immediately, pt stopped gasping and tried to drink water, stating that she "needed to burp". Pt states she did not have asthma attack, she felt like she couldn't breathe because of gas buildup. Pt running 99% on 2L. Pt weaned to RA with 02 sats 96%. Pt ambulated down hallway to help encourage expelling of gas. Pt given carbonated drink and sat 90 degrees in bed. Will continue to monitor 02 sats and breathing patterns.  Salvadore Oxford, RN

## 2012-01-24 NOTE — Discharge Instructions (Signed)
No lifting no bending no twisting no driving a riding a car to

## 2012-01-24 NOTE — Discharge Summary (Signed)
  Physician Discharge Summary  Patient ID: Patricia Fischer MRN: [id number] DOB/AGE: [birth year] 56 y.o.  Admit date: 01/23/2012 Discharge date: 01/24/2012  Admission Diagnoses:C6 radiculopathy from herniated nuclear pulposus C5-6Discharge Diagnoses:  Active Problems:  * No active hospital problems. *    Discharged Condition: same  Hospital Course: patient was admitted hospital underwent ACDF C5-6 date postop did very well recovered in the floor was convalescing well the floor in the living and voiding spontaneously tolerating rigid diet had 1 episode of difficulty breathing is achievable choked on some medication but this resolved spontaneously.  Consults: Significant Diagnostic Studies: Treatments:ACDF C5-6 Discharge Exam: Blood pressure 151/91, pulse 99, temperature 97.8 F (36.6 C), temperature source Oral, resp. rate 19, height 5\' 5"  (1.651 m), weight 117.436 kg (258 lb 14.4 oz), SpO2 92.00%. Strength out of 5 wound clean and dry  Disposition: home   Medication List  As of 01/24/2012  9:58 AM   TAKE these medications         albuterol 108 (90 BASE) MCG/ACT inhaler   Commonly known as: PROVENTIL HFA;VENTOLIN HFA   Inhale 2 puffs into the lungs every 6 (six) hours as needed.      cyclobenzaprine 5 MG tablet   Commonly known as: FLEXERIL   Take 5 mg by mouth 3 (three) times daily as needed. For muscle pain      DULoxetine 60 MG capsule   Commonly known as: CYMBALTA   Take 60 mg by mouth daily.      loratadine-pseudoephedrine 10-240 MG per 24 hr tablet   Commonly known as: CLARITIN-D 24-hour   Take 1 tablet by mouth daily.      losartan-hydrochlorothiazide 100-25 MG per tablet   Commonly known as: HYZAAR   Take 1 tablet by mouth daily.      oxyCODONE-acetaminophen 10-325 MG per tablet   Commonly known as: PERCOCET   Take 1 tablet by mouth every 4 (four) hours as needed.             Signed: Naia Ruff P 01/24/2012, 9:58 AM

## 2012-01-24 NOTE — Progress Notes (Signed)
Subjective: Patient reports That she's feeling better this morning that her swallowing or breathing no of her preoperative radicular pain  Objective: Vital signs in last 24 hours: Temp:  [97.6 F (36.4 C)-98.4 F (36.9 C)] 97.8 F (36.6 C) (05/21 0600) Pulse Rate:  [84-101] 99  (05/21 0600) Resp:  [18-26] 19  (05/21 0600) BP: (135-166)/(76-91) 151/91 mmHg (05/21 0600) SpO2:  [92 %-95 %] 92 % (05/21 0600) Weight:  [117.436 kg (258 lb 14.4 oz)] 117.436 kg (258 lb 14.4 oz) (05/20 1100)  Intake/Output from previous day: 05/20 0701 - 05/21 0700 In: 2080 [P.O.:480; I.V.:1600] Out: 15 [Blood:15] Intake/Output this shift:    Strength out of 5 wound clean dry  Lab Results: No results found for this basename: WBC:2,HGB:2,HCT:2,PLT:2 in the last 72 hours BMET No results found for this basename: NA:2,K:2,CL:2,CO2:2,GLUCOSE:2,BUN:2,CREATININE:2,CALCIUM:2 in the last 72 hours  Studies/Results: Dg Cervical Spine 2-3 Views  01/23/2012  *RADIOLOGY REPORT*  Clinical Data: C5-6 ACDF.  DG C-ARM 1-60 MIN,CERVICAL SPINE - 2-3 VIEW  Technique: Two intraoperative fluoroscopic spot views of the cervical spine in the lateral projection are provided.  Comparison:  None.  Findings: Provided images demonstrate anterior plate and screws and interbody spacer in place at C5-6.  IMPRESSION: C5-6 ACDF.  Original Report Authenticated By: Bernadene Bell. D'ALESSIO, M.D.   Dg Chest Portable 1 View  01/23/2012  *RADIOLOGY REPORT*  Clinical Data: Decreased O2 sats  PORTABLE CHEST - 1 VIEW  Comparison: 01/19/2012  Findings: Endotracheal tube terminates 3 cm above the carina.  Low lung volumes.  Scattered atelectasis.  No pleural effusion or pneumothorax.  The heart is top normal in size for aspiration.  Cervical spine fixation hardware.  IMPRESSION: Endotracheal tube terminates 3 cm above the carina.  Low lung volumes with scattered atelectasis.  Original Report Authenticated By: Charline Bills, M.D.   Dg C-arm 1-60  Min  01/23/2012  *RADIOLOGY REPORT*  Clinical Data: C5-6 ACDF.  DG C-ARM 1-60 MIN,CERVICAL SPINE - 2-3 VIEW  Technique: Two intraoperative fluoroscopic spot views of the cervical spine in the lateral projection are provided.  Comparison:  None.  Findings: Provided images demonstrate anterior plate and screws and interbody spacer in place at C5-6.  IMPRESSION: C5-6 ACDF.  Original Report Authenticated By: Bernadene Bell. Maricela Curet, M.D.    Assessment/Plan: Posterior day 1 ACDF C5-6 discharge him  LOS: 1 day     Kristien Salatino P 01/24/2012, 9:55 AM

## 2012-05-24 ENCOUNTER — Ambulatory Visit: Payer: Self-pay | Admitting: General Surgery

## 2013-11-14 ENCOUNTER — Other Ambulatory Visit: Payer: Self-pay | Admitting: Neurosurgery

## 2013-11-14 DIAGNOSIS — M47812 Spondylosis without myelopathy or radiculopathy, cervical region: Secondary | ICD-10-CM

## 2013-11-21 ENCOUNTER — Ambulatory Visit
Admission: RE | Admit: 2013-11-21 | Discharge: 2013-11-21 | Disposition: A | Discharge status: Home / Self Care | Payer: BC Managed Care – PPO | Source: Ambulatory Visit | Attending: Neurosurgery | Admitting: Neurosurgery

## 2013-11-21 ENCOUNTER — Encounter (HOSPITAL_COMMUNITY): Payer: Self-pay | Admitting: *Deleted

## 2013-11-21 ENCOUNTER — Other Ambulatory Visit: Payer: Self-pay | Admitting: Neurosurgery

## 2013-11-21 DIAGNOSIS — M47812 Spondylosis without myelopathy or radiculopathy, cervical region: Secondary | ICD-10-CM

## 2013-11-21 MED ORDER — CLINDAMYCIN PHOSPHATE 900 MG/50ML IV SOLN: 900.0000 mg | INTRAVENOUS | Status: DC

## 2013-11-21 MED ORDER — CHLORHEXIDINE GLUCONATE 4 % EX LIQD
60.0000 mL | Freq: Once | CUTANEOUS | Status: DC
  Filled 2013-11-21: qty 60

## 2013-11-21 MED ORDER — GADOPENTETATE DIMEGLUMINE 469.01 MG/ML IV SOLN
20.0000 mL | Freq: Once | INTRAVENOUS | Status: AC | PRN
  Administered 2013-11-21: 20 mL via INTRAVENOUS

## 2013-11-21 NOTE — H&P (Signed)
Patricia Fischer is an 58 y.o. female.    Chief Complaint: right shoulder pain   HPI: Pt is a 58 y.o. female complaining of right shoulder pain for multiple months. Patient had a recent cuff repair 3 months ago but still c/o moderate to severe pain in the shoulder. New mri demonstrates torn subscap tendon. Various options are discussed with the patient. Risks, benefits and expectations were discussed with the patient. Patient understand the risks, benefits and expectations and wishes to proceed with surgery to repair the subscap, increase rom and function.  PCP:  Golden Pop, MD  D/C Plans:  Home   PMH: Past Medical History  Diagnosis Date  . Hypertension   . Headache(784.0)   . Asthma     uses MDI prn; no probems x 1 year  . Environmental allergies   . Pneumonia 2012  . Blood transfusion 1979  . GERD (gastroesophageal reflux disease)   . Depression   . Cancer 2011    left breast, DCIS    PSH: Past Surgical History  Procedure Laterality Date  . Breast surgery  2011    left breast lumpectomy  . Cardiac catheterization  2010    [facility]; pt states it was "clear"  . Tonsillectomy    . Appendectomy    . Cholecystectomy    . Tubal ligation    . Anal fissure repair    . Knee cartilage surgery    . Carpal tunnel release      left hand  . Tarsal tunnel release  1990    left foot  . Anterior cervical decomp/discectomy fusion  01/23/2012    Procedure: ANTERIOR CERVICAL DECOMPRESSION/DISCECTOMY FUSION 1 LEVEL;  Surgeon: Elaina Hoops, MD;  Location: [facility];  Service: Neurosurgery;  Laterality: N/A;  Cervical five-six anterior cervical discectomy fusion with plating  . Cesarean section  1982  . Abdominal hysterectomy  1986  . Colonoscopy  01/16/2009    Dr Bary Castilla    Social History:  reports that she has never smoked. She has never used smokeless tobacco. She reports that she does not drink alcohol or use illicit drugs.  Allergies:  Allergies  Allergen  Reactions  . Gadolinium Derivatives Shortness Of Breath and Nausea And Vomiting  . Sulfa Antibiotics Swelling    "Ears swelled up like Dumbo"  . Codeine Itching and Rash  . Penicillins Hives    Medications: No current facility-administered medications for this encounter.   Current Outpatient Prescriptions  Medication Sig Dispense Refill  . albuterol (PROVENTIL HFA;VENTOLIN HFA) 108 (90 BASE) MCG/ACT inhaler Inhale 2 puffs into the lungs every 6 (six) hours as needed.      . cyclobenzaprine (FLEXERIL) 5 MG tablet Take 5 mg by mouth 3 (three) times daily as needed. For muscle pain      . DULoxetine (CYMBALTA) 60 MG capsule Take 60 mg by mouth daily.      Marland Kitchen loratadine-pseudoephedrine (CLARITIN-D 24-HOUR) 10-240 MG per 24 hr tablet Take 1 tablet by mouth daily.      Marland Kitchen losartan-hydrochlorothiazide (HYZAAR) 100-25 MG per tablet Take 1 tablet by mouth daily.      Marland Kitchen oxyCODONE-acetaminophen (PERCOCET) 10-325 MG per tablet Take 1 tablet by mouth every 4 (four) hours as needed.  80 tablet  0    No results found for this or any previous visit (from the past 48 hour(s)). Mr Cervical Spine Wo Contrast  11/21/2013   CLINICAL DATA:  Headaches. Right lateral neck pain. Prior cervical spine surgery.  Patient developed acute shortness of Breath following the injection of IV contrast. This was treated with her own albuterol inhaler and resolved in 5-10 min. The patient's vital signs were unremarkable. Her oxygen saturation on room air was 94%. She was observed for 30 min prior to discharge in stable condition. No postcontrast imaging was obtained.  BUN and creatinine were obtained on site at [facility] at [address]Results: BUN 12 mg/dL, Creatinine 0.8 mg/dL.  EXAM: MRI CERVICAL SPINE WITHOUT CONTRAST  TECHNIQUE: Multiplanar, multisequence MR imaging was performed. No intravenous contrast was administered.  COMPARISON:  One view lateral cervical spine 09/18/2012  FINDINGS: Normal signal is  present in the cervical and upper thoracic spinal cord to the lowest imaged level, T2-3. The patient is fused anteriorly at C5-6. Marrow signal and vertebral body heights are otherwise normal.  The craniocervical junction is within normal limits. The visualized intracranial contents are normal.  C2-3: Mild left-sided facet hypertrophy is present. There is no significant stenosis.  C3-4: Mild uncovertebral spurring is present. There is a broad-based disc osteophyte complex with partial effacement of the ventral CSF. Mild facet hypertrophy is evident bilaterally. Mild bilateral foraminal narrowing is noted.  C4-5: Mild uncovertebral and facet hypertrophy is evident bilaterally. Mild foraminal narrowing results.  C5-6: The patient is fused anteriorly. Mild uncovertebral spurring is noted bilaterally. The foramina are patent.  C6-7:  Negative.  C7-T1:  Negative.  IMPRESSION: 1. Mild foraminal narrowing bilaterally at C3-4 due to a broad-based disc osteophyte complex, uncovertebral spurring and facet hypertrophy. 2. Mild foraminal narrowing at C4-5 is worse on the left. 3. Anterior fusion at C5-6 without evidence for residual or recurrent stenosis. 4. Patient had a significant reaction following the injection of IV contrast with sudden shortness of breath. This resolved with use of an albuterol inhaler.   Electronically Signed   By: Lawrence Santiago M.D.   On: 11/21/2013 14:43    ROS: Pain with rom of the right upper extremity  Physical Exam:  Alert and oriented 57 y.o. female in no acute distress Cranial nerves 2-12 intact Cervical spine: full rom with no tenderness, nv intact distally Chest: active breath sounds bilaterally, no wheeze rhonchi or rales Heart: regular rate and rhythm, no murmur Abd: non tender non distended with active bowel sounds Hip is stable with rom  Right shoulder with mild guarding with rom nv intact distally Very limited strength with IR testing No rashes or  edema  Assessment/Plan Assessment: right shoulder pain secondary to subscap tear  Plan: Patient will undergo a open subscap repair by Dr. Veverly Fells at [facility]. Risks benefits and expectations were discussed with the patient. Patient understand risks, benefits and expectations and wishes to proceed.

## 2013-11-21 NOTE — Progress Notes (Signed)
Pt feeling much better now and taking sips of liquids. To diagnostic for her spine films.

## 2013-11-21 NOTE — Progress Notes (Addendum)
Pt to nursing area because of SOB during injection of contrast for MRI. Dr. Jobe Igo was with pt and stayed to talk to her. Pt is very nauseated and has dry heaves.  VS are okay.  Dr. Jobe Igo is aware of values.

## 2013-11-22 ENCOUNTER — Encounter (HOSPITAL_COMMUNITY): Payer: Self-pay | Admitting: *Deleted

## 2013-11-22 ENCOUNTER — Encounter (HOSPITAL_COMMUNITY): Payer: Self-pay

## 2013-11-22 ENCOUNTER — Encounter (HOSPITAL_COMMUNITY)
Admission: RE | Disposition: A | Discharge status: Home / Self Care | Payer: Self-pay | Source: Ambulatory Visit | Attending: Orthopedic Surgery

## 2013-11-22 ENCOUNTER — Ambulatory Visit (HOSPITAL_COMMUNITY): Payer: BC Managed Care – PPO

## 2013-11-22 ENCOUNTER — Observation Stay (HOSPITAL_COMMUNITY)
Admission: RE | Admit: 2013-11-22 | Discharge: 2013-11-23 | Disposition: A | Discharge status: Home / Self Care | Payer: BC Managed Care – PPO | Source: Ambulatory Visit | Attending: Orthopedic Surgery | Admitting: Orthopedic Surgery

## 2013-11-22 ENCOUNTER — Ambulatory Visit (HOSPITAL_COMMUNITY): Payer: BC Managed Care – PPO | Admitting: Certified Registered"

## 2013-11-22 ENCOUNTER — Encounter (HOSPITAL_COMMUNITY): Payer: BC Managed Care – PPO | Admitting: Certified Registered"

## 2013-11-22 DIAGNOSIS — S46819A Strain of other muscles, fascia and tendons at shoulder and upper arm level, unspecified arm, initial encounter: Principal | ICD-10-CM | POA: Insufficient documentation

## 2013-11-22 DIAGNOSIS — Z87891 Personal history of nicotine dependence: Secondary | ICD-10-CM | POA: Insufficient documentation

## 2013-11-22 DIAGNOSIS — F329 Major depressive disorder, single episode, unspecified: Secondary | ICD-10-CM | POA: Insufficient documentation

## 2013-11-22 DIAGNOSIS — I10 Essential (primary) hypertension: Secondary | ICD-10-CM | POA: Insufficient documentation

## 2013-11-22 DIAGNOSIS — X58XXXA Exposure to other specified factors, initial encounter: Secondary | ICD-10-CM | POA: Insufficient documentation

## 2013-11-22 DIAGNOSIS — J45909 Unspecified asthma, uncomplicated: Secondary | ICD-10-CM | POA: Insufficient documentation

## 2013-11-22 DIAGNOSIS — R350 Frequency of micturition: Secondary | ICD-10-CM | POA: Insufficient documentation

## 2013-11-22 DIAGNOSIS — Z853 Personal history of malignant neoplasm of breast: Secondary | ICD-10-CM | POA: Insufficient documentation

## 2013-11-22 DIAGNOSIS — I252 Old myocardial infarction: Secondary | ICD-10-CM | POA: Insufficient documentation

## 2013-11-22 DIAGNOSIS — F3289 Other specified depressive episodes: Secondary | ICD-10-CM | POA: Insufficient documentation

## 2013-11-22 DIAGNOSIS — R51 Headache: Secondary | ICD-10-CM | POA: Insufficient documentation

## 2013-11-22 DIAGNOSIS — F411 Generalized anxiety disorder: Secondary | ICD-10-CM | POA: Insufficient documentation

## 2013-11-22 DIAGNOSIS — K219 Gastro-esophageal reflux disease without esophagitis: Secondary | ICD-10-CM | POA: Insufficient documentation

## 2013-11-22 DIAGNOSIS — I509 Heart failure, unspecified: Secondary | ICD-10-CM | POA: Insufficient documentation

## 2013-11-22 DIAGNOSIS — I2510 Atherosclerotic heart disease of native coronary artery without angina pectoris: Secondary | ICD-10-CM | POA: Insufficient documentation

## 2013-11-22 DIAGNOSIS — Z5333 Arthroscopic surgical procedure converted to open procedure: Secondary | ICD-10-CM | POA: Insufficient documentation

## 2013-11-22 HISTORY — DX: Unspecified injury of head, initial encounter: S09.90XA

## 2013-11-22 HISTORY — DX: Unspecified hemorrhoids: K64.9

## 2013-11-22 HISTORY — PX: SHOULDER ARTHROSCOPY WITH SUBACROMIAL DECOMPRESSION, ROTATOR CUFF REPAIR AND BICEP TENDON REPAIR: SHX5687

## 2013-11-22 HISTORY — DX: Strain of other muscles, fascia and tendons at shoulder and upper arm level, unspecified arm, initial encounter: S46.819A

## 2013-11-22 HISTORY — DX: Anxiety disorder, unspecified: F41.9

## 2013-11-22 LAB — TYPE AND SCREEN
ABO/RH(D): O POS
Antibody Screen: NEGATIVE

## 2013-11-22 LAB — CBC
HCT: 38.9 % (ref 36.0–46.0)
Hemoglobin: 13 g/dL (ref 12.0–15.0)
MCH: 29.3 pg (ref 26.0–34.0)
MCHC: 33.4 g/dL (ref 30.0–36.0)
MCV: 87.8 fL (ref 78.0–100.0)
Platelets: 188 10*3/uL (ref 150–400)
RBC: 4.43 MIL/uL (ref 3.87–5.11)
RDW: 13.8 % (ref 11.5–15.5)
WBC: 7.8 10*3/uL (ref 4.0–10.5)

## 2013-11-22 LAB — BASIC METABOLIC PANEL
BUN: 14 mg/dL (ref 6–23)
CO2: 29 mEq/L (ref 19–32)
CREATININE: 0.75 mg/dL (ref 0.50–1.10)
Calcium: 9.5 mg/dL (ref 8.4–10.5)
Chloride: 100 mEq/L (ref 96–112)
GFR calc Af Amer: >90 mL/min (ref >90)
Glucose, Bld: 101 mg/dL — ABNORMAL HIGH (ref 70–99)
POTASSIUM: 4.3 meq/L (ref 3.7–5.3)
Sodium: 141 mEq/L (ref 137–147)

## 2013-11-22 LAB — ABO/RH: ABO/RH(D): O POS

## 2013-11-22 SURGERY — SHOULDER ARTHROSCOPY WITH SUBACROMIAL DECOMPRESSION, ROTATOR CUFF REPAIR AND BICEP TENDON REPAIR
Anesthesia: Regional | Site: Shoulder | Laterality: Right

## 2013-11-22 MED ORDER — SODIUM CHLORIDE 0.9 % IR SOLN
Status: DC | PRN
  Administered 2013-11-22 (×2): 3000 mL

## 2013-11-22 MED ORDER — LEVALBUTEROL TARTRATE 45 MCG/ACT IN AERO: 1.0000 | INHALATION_SPRAY | RESPIRATORY_TRACT | Status: DC | PRN

## 2013-11-22 MED ORDER — GLYCOPYRROLATE 0.2 MG/ML IJ SOLN
INTRAMUSCULAR | Status: DC | PRN
  Administered 2013-11-22: 0.6 mg via INTRAVENOUS

## 2013-11-22 MED ORDER — ONDANSETRON HCL 4 MG/2ML IJ SOLN: 4.0000 mg | Freq: Four times a day (QID) | INTRAMUSCULAR | Status: DC | PRN

## 2013-11-22 MED ORDER — MENTHOL 3 MG MT LOZG: 1.0000 | LOZENGE | OROMUCOSAL | Status: DC | PRN

## 2013-11-22 MED ORDER — MELATONIN 10 MG PO TABS: 10.0000 mg | ORAL_TABLET | Freq: Every evening | ORAL | Status: DC | PRN

## 2013-11-22 MED ORDER — SODIUM CHLORIDE 0.9 % IV SOLN: INTRAVENOUS | Status: DC

## 2013-11-22 MED ORDER — FENTANYL CITRATE 0.05 MG/ML IJ SOLN
25.0000 ug | INTRAMUSCULAR | Status: DC | PRN
  Administered 2013-11-22 (×4): 25 ug via INTRAVENOUS

## 2013-11-22 MED ORDER — ALBUTEROL SULFATE (2.5 MG/3ML) 0.083% IN NEBU: 6.0000 mL | INHALATION_SOLUTION | Freq: Four times a day (QID) | RESPIRATORY_TRACT | Status: DC | PRN

## 2013-11-22 MED ORDER — ACETAMINOPHEN 500 MG PO TABS
1000.0000 mg | ORAL_TABLET | Freq: Four times a day (QID) | ORAL | Status: DC | PRN
  Administered 2013-11-22 – 2013-11-23 (×2): 1000 mg via ORAL
  Filled 2013-11-22 (×3): qty 2

## 2013-11-22 MED ORDER — HYDROCHLOROTHIAZIDE 25 MG PO TABS
25.0000 mg | ORAL_TABLET | Freq: Every day | ORAL | Status: DC
  Administered 2013-11-22 – 2013-11-23 (×2): 25 mg via ORAL
  Filled 2013-11-22 (×2): qty 1

## 2013-11-22 MED ORDER — NEOSTIGMINE METHYLSULFATE 1 MG/ML IJ SOLN
INTRAMUSCULAR | Status: AC
  Filled 2013-11-22: qty 10

## 2013-11-22 MED ORDER — ACETAMINOPHEN 160 MG/5ML PO SOLN
325.0000 mg | ORAL | Status: DC | PRN
  Filled 2013-11-22: qty 20.3

## 2013-11-22 MED ORDER — GABAPENTIN 600 MG PO TABS
600.0000 mg | ORAL_TABLET | Freq: Every day | ORAL | Status: DC
  Administered 2013-11-23: 600 mg via ORAL
  Filled 2013-11-22: qty 1

## 2013-11-22 MED ORDER — ACETAMINOPHEN 650 MG RE SUPP: 650.0000 mg | Freq: Four times a day (QID) | RECTAL | Status: DC | PRN

## 2013-11-22 MED ORDER — HYDROMORPHONE HCL 2 MG PO TABS: 2.0000 mg | ORAL_TABLET | ORAL | Status: DC | PRN

## 2013-11-22 MED ORDER — METOCLOPRAMIDE HCL 5 MG/ML IJ SOLN: 5.0000 mg | Freq: Three times a day (TID) | INTRAMUSCULAR | Status: DC | PRN

## 2013-11-22 MED ORDER — PHENYLEPHRINE HCL 10 MG/ML IJ SOLN
INTRAMUSCULAR | Status: DC | PRN
  Administered 2013-11-22 (×4): 40 ug via INTRAVENOUS

## 2013-11-22 MED ORDER — LACTATED RINGERS IV SOLN
INTRAVENOUS | Status: DC | PRN
  Administered 2013-11-22 (×2): via INTRAVENOUS

## 2013-11-22 MED ORDER — GLYCOPYRROLATE 0.2 MG/ML IJ SOLN
INTRAMUSCULAR | Status: AC
  Filled 2013-11-22: qty 1

## 2013-11-22 MED ORDER — FENTANYL CITRATE 0.05 MG/ML IJ SOLN
INTRAMUSCULAR | Status: AC
  Filled 2013-11-22: qty 2

## 2013-11-22 MED ORDER — MIDAZOLAM HCL 2 MG/2ML IJ SOLN
INTRAMUSCULAR | Status: AC
  Administered 2013-11-22: 2 mg
  Filled 2013-11-22: qty 2

## 2013-11-22 MED ORDER — DEXAMETHASONE SODIUM PHOSPHATE 10 MG/ML IJ SOLN
INTRAMUSCULAR | Status: DC | PRN
  Administered 2013-11-22: 8 mg via INTRAVENOUS

## 2013-11-22 MED ORDER — ONDANSETRON HCL 4 MG/2ML IJ SOLN
INTRAMUSCULAR | Status: DC | PRN
  Administered 2013-11-22 (×2): 4 mg via INTRAVENOUS

## 2013-11-22 MED ORDER — LACTATED RINGERS IV SOLN
INTRAVENOUS | Status: DC
  Administered 2013-11-22: 14:00:00 via INTRAVENOUS

## 2013-11-22 MED ORDER — LOSARTAN POTASSIUM-HCTZ 100-25 MG PO TABS: 1.0000 | ORAL_TABLET | Freq: Every day | ORAL | Status: DC

## 2013-11-22 MED ORDER — HYDROCODONE-ACETAMINOPHEN 7.5-325 MG PO TABS
2.0000 | ORAL_TABLET | ORAL | Status: DC | PRN
  Administered 2013-11-22 – 2013-11-23 (×4): 2 via ORAL
  Filled 2013-11-22 (×4): qty 2

## 2013-11-22 MED ORDER — METHOCARBAMOL 500 MG PO TABS
500.0000 mg | ORAL_TABLET | Freq: Every day | ORAL | Status: DC
  Administered 2013-11-22 – 2013-11-23 (×2): 500 mg via ORAL
  Filled 2013-11-22 (×2): qty 1

## 2013-11-22 MED ORDER — GABAPENTIN 300 MG PO CAPS
300.0000 mg | ORAL_CAPSULE | Freq: Every day | ORAL | Status: DC
  Administered 2013-11-23: 300 mg via ORAL
  Filled 2013-11-22: qty 1

## 2013-11-22 MED ORDER — BUPIVACAINE-EPINEPHRINE 0.25% -1:200000 IJ SOLN
INTRAMUSCULAR | Status: DC | PRN
  Administered 2013-11-22: 9 mL

## 2013-11-22 MED ORDER — ACETAMINOPHEN 325 MG PO TABS: 650.0000 mg | ORAL_TABLET | Freq: Four times a day (QID) | ORAL | Status: DC | PRN

## 2013-11-22 MED ORDER — BUPIVACAINE-EPINEPHRINE (PF) 0.25% -1:200000 IJ SOLN
INTRAMUSCULAR | Status: AC
  Filled 2013-11-22: qty 30

## 2013-11-22 MED ORDER — METOCLOPRAMIDE HCL 10 MG PO TABS: 5.0000 mg | ORAL_TABLET | Freq: Three times a day (TID) | ORAL | Status: DC | PRN

## 2013-11-22 MED ORDER — SUCCINYLCHOLINE CHLORIDE 20 MG/ML IJ SOLN
INTRAMUSCULAR | Status: DC | PRN
  Administered 2013-11-22: 120 mg via INTRAVENOUS

## 2013-11-22 MED ORDER — ALBUTEROL SULFATE (2.5 MG/3ML) 0.083% IN NEBU: 2.5000 mg | INHALATION_SOLUTION | RESPIRATORY_TRACT | Status: DC | PRN

## 2013-11-22 MED ORDER — CYCLOBENZAPRINE HCL 10 MG PO TABS
5.0000 mg | ORAL_TABLET | Freq: Three times a day (TID) | ORAL | Status: DC | PRN
  Administered 2013-11-23 (×2): 5 mg via ORAL
  Filled 2013-11-22 (×2): qty 1

## 2013-11-22 MED ORDER — PROPOFOL 10 MG/ML IV BOLUS
INTRAVENOUS | Status: DC | PRN
  Administered 2013-11-22: 200 mg via INTRAVENOUS

## 2013-11-22 MED ORDER — PHENOL 1.4 % MT LIQD: 1.0000 | OROMUCOSAL | Status: DC | PRN

## 2013-11-22 MED ORDER — DULOXETINE HCL 60 MG PO CPEP
120.0000 mg | ORAL_CAPSULE | Freq: Every day | ORAL | Status: DC
  Administered 2013-11-23: 120 mg via ORAL
  Filled 2013-11-22: qty 2

## 2013-11-22 MED ORDER — NEOSTIGMINE METHYLSULFATE 1 MG/ML IJ SOLN
INTRAMUSCULAR | Status: DC | PRN
  Administered 2013-11-22: 4 mg via INTRAVENOUS

## 2013-11-22 MED ORDER — SCOPOLAMINE 1 MG/3DAYS TD PT72
MEDICATED_PATCH | TRANSDERMAL | Status: AC
  Filled 2013-11-22: qty 1

## 2013-11-22 MED ORDER — ROPIVACAINE HCL 5 MG/ML IJ SOLN
INTRAMUSCULAR | Status: DC | PRN
  Administered 2013-11-22: 25 mL via PERINEURAL

## 2013-11-22 MED ORDER — ONDANSETRON HCL 4 MG/2ML IJ SOLN: 4.0000 mg | Freq: Once | INTRAMUSCULAR | Status: DC | PRN

## 2013-11-22 MED ORDER — FENTANYL CITRATE 0.05 MG/ML IJ SOLN
INTRAMUSCULAR | Status: AC
  Filled 2013-11-22: qty 5

## 2013-11-22 MED ORDER — CLINDAMYCIN PHOSPHATE 900 MG/50ML IV SOLN
INTRAVENOUS | Status: AC
  Administered 2013-11-22: 900 mg via INTRAVENOUS
  Filled 2013-11-22: qty 50

## 2013-11-22 MED ORDER — GABAPENTIN 300 MG PO CAPS: 300.0000 mg | ORAL_CAPSULE | Freq: Three times a day (TID) | ORAL | Status: DC

## 2013-11-22 MED ORDER — CLINDAMYCIN PHOSPHATE 600 MG/50ML IV SOLN
600.0000 mg | Freq: Four times a day (QID) | INTRAVENOUS | Status: AC
  Administered 2013-11-22 – 2013-11-23 (×3): 600 mg via INTRAVENOUS
  Filled 2013-11-22 (×4): qty 50

## 2013-11-22 MED ORDER — HYDROMORPHONE HCL 2 MG PO TABS
2.0000 mg | ORAL_TABLET | Freq: Four times a day (QID) | ORAL | Status: DC | PRN
  Administered 2013-11-22 – 2013-11-23 (×3): 2 mg via ORAL
  Filled 2013-11-22 (×3): qty 1

## 2013-11-22 MED ORDER — ENOXAPARIN SODIUM 40 MG/0.4ML ~~LOC~~ SOLN
40.0000 mg | SUBCUTANEOUS | Status: DC
  Administered 2013-11-23: 40 mg via SUBCUTANEOUS
  Filled 2013-11-22 (×2): qty 0.4

## 2013-11-22 MED ORDER — BUPIVACAINE-EPINEPHRINE (PF) 0.5% -1:200000 IJ SOLN
INTRAMUSCULAR | Status: AC
  Filled 2013-11-22: qty 10

## 2013-11-22 MED ORDER — ONDANSETRON HCL 4 MG/2ML IJ SOLN
INTRAMUSCULAR | Status: AC
  Filled 2013-11-22: qty 2

## 2013-11-22 MED ORDER — FENTANYL CITRATE 0.05 MG/ML IJ SOLN
INTRAMUSCULAR | Status: AC
  Administered 2013-11-22: 100 ug
  Filled 2013-11-22: qty 2

## 2013-11-22 MED ORDER — ONDANSETRON HCL 4 MG PO TABS: 4.0000 mg | ORAL_TABLET | Freq: Four times a day (QID) | ORAL | Status: DC | PRN

## 2013-11-22 MED ORDER — ACETAMINOPHEN 325 MG PO TABS: 325.0000 mg | ORAL_TABLET | ORAL | Status: DC | PRN

## 2013-11-22 MED ORDER — SCOPOLAMINE 1 MG/3DAYS TD PT72
MEDICATED_PATCH | TRANSDERMAL | Status: DC | PRN
  Administered 2013-11-22: 1 via TRANSDERMAL

## 2013-11-22 MED ORDER — DEXAMETHASONE SODIUM PHOSPHATE 4 MG/ML IJ SOLN
INTRAMUSCULAR | Status: AC
  Filled 2013-11-22: qty 2

## 2013-11-22 MED ORDER — LOSARTAN POTASSIUM 50 MG PO TABS
100.0000 mg | ORAL_TABLET | Freq: Every day | ORAL | Status: DC
  Administered 2013-11-22 – 2013-11-23 (×2): 100 mg via ORAL
  Filled 2013-11-22 (×2): qty 2

## 2013-11-22 MED ORDER — FENTANYL CITRATE 0.05 MG/ML IJ SOLN
INTRAMUSCULAR | Status: DC | PRN
  Administered 2013-11-22: 100 ug via INTRAVENOUS

## 2013-11-22 MED ORDER — ACETAMINOPHEN 500 MG PO TABS
ORAL_TABLET | ORAL | Status: AC
  Administered 2013-11-22: 1000 mg
  Filled 2013-11-22: qty 2

## 2013-11-22 MED ORDER — ROCURONIUM BROMIDE 100 MG/10ML IV SOLN
INTRAVENOUS | Status: DC | PRN
  Administered 2013-11-22: 20 mg via INTRAVENOUS

## 2013-11-22 MED ORDER — GABAPENTIN 600 MG PO TABS
600.0000 mg | ORAL_TABLET | Freq: Every day | ORAL | Status: DC
  Administered 2013-11-22: 600 mg via ORAL
  Filled 2013-11-22 (×2): qty 1

## 2013-11-22 SURGICAL SUPPLY — 60 items
ANCHOR ALL- SUT RC 2 SUT Y-K (Anchor) ×4 IMPLANT
ANCHOR ALL-SUT RC 2 SUT Y-K (Anchor) ×2 IMPLANT
BLADE LONG MED 31MMX9MM (MISCELLANEOUS)
BLADE LONG MED 31X9 (MISCELLANEOUS) IMPLANT
BLADE SURG 11 STRL SS (BLADE) ×3 IMPLANT
BUR OVAL 4.0 (BURR) ×3 IMPLANT
CLOSURE WOUND 1/2 X4 (GAUZE/BANDAGES/DRESSINGS) ×1
DRAPE INCISE IOBAN 66X45 STRL (DRAPES) ×3 IMPLANT
DRAPE U-SHAPE 47X51 STRL (DRAPES) ×3 IMPLANT
DRSG EMULSION OIL 3X3 NADH (GAUZE/BANDAGES/DRESSINGS) ×3 IMPLANT
DRSG PAD ABDOMINAL 8X10 ST (GAUZE/BANDAGES/DRESSINGS) ×6 IMPLANT
DURAPREP 26ML APPLICATOR (WOUND CARE) ×3 IMPLANT
ELECT NEEDLE TIP 2.8 STRL (NEEDLE) ×3 IMPLANT
ELECT REM PT RETURN 9FT ADLT (ELECTROSURGICAL) ×3
ELECTRODE REM PT RTRN 9FT ADLT (ELECTROSURGICAL) ×1 IMPLANT
GLOVE BIOGEL PI ORTHO PRO 7.5 (GLOVE) ×2
GLOVE BIOGEL PI ORTHO PRO SZ8 (GLOVE) ×2
GLOVE ORTHO TXT STRL SZ7.5 (GLOVE) ×3 IMPLANT
GLOVE PI ORTHO PRO STRL 7.5 (GLOVE) ×1 IMPLANT
GLOVE PI ORTHO PRO STRL SZ8 (GLOVE) ×1 IMPLANT
GLOVE SURG ORTHO 8.5 STRL (GLOVE) ×3 IMPLANT
GOWN STRL REUS W/ TWL XL LVL3 (GOWN DISPOSABLE) ×2 IMPLANT
GOWN STRL REUS W/TWL XL LVL3 (GOWN DISPOSABLE) ×4
HIFI SURGICAL SUTURE ×6 IMPLANT
KIT BASIN OR (CUSTOM PROCEDURE TRAY) ×3 IMPLANT
KIT ROOM TURNOVER OR (KITS) ×3 IMPLANT
MANIFOLD NEPTUNE II (INSTRUMENTS) ×3 IMPLANT
NDL SUT 2 .5 CRC MAYO 1.732X (NEEDLE) ×1 IMPLANT
NDL SUT 6 .5 CRC .975X.05 MAYO (NEEDLE) ×1 IMPLANT
NEEDLE HYPO 25GX1X1/2 BEV (NEEDLE) ×3 IMPLANT
NEEDLE MAYO TAPER (NEEDLE) ×4
NEEDLE SPNL 18GX3.5 QUINCKE PK (NEEDLE) ×3 IMPLANT
NS IRRIG 1000ML POUR BTL (IV SOLUTION) ×3 IMPLANT
PACK SHOULDER (CUSTOM PROCEDURE TRAY) ×3 IMPLANT
PAD ABD 8X10 STRL (GAUZE/BANDAGES/DRESSINGS) ×3 IMPLANT
PAD ARMBOARD 7.5X6 YLW CONV (MISCELLANEOUS) ×6 IMPLANT
RESECTOR FULL RADIUS 4.2MM (BLADE) ×3 IMPLANT
SET ARTHROSCOPY TUBING (MISCELLANEOUS) ×2
SET ARTHROSCOPY TUBING LN (MISCELLANEOUS) ×1 IMPLANT
SLING ARM IMMOBILIZER LRG (SOFTGOODS) ×3 IMPLANT
SPONGE GAUZE 4X4 12PLY (GAUZE/BANDAGES/DRESSINGS) ×3 IMPLANT
SPONGE LAP 4X18 X RAY DECT (DISPOSABLE) ×3 IMPLANT
STRIP CLOSURE SKIN 1/2X4 (GAUZE/BANDAGES/DRESSINGS) ×2 IMPLANT
SUCTION FRAZIER TIP 10 FR DISP (SUCTIONS) ×3 IMPLANT
SUT BONE WAX W31G (SUTURE) IMPLANT
SUT FIBERWIRE #2 38 T-5 BLUE (SUTURE)
SUT MNCRL AB 4-0 PS2 18 (SUTURE) ×3 IMPLANT
SUT VIC AB 0 CT1 27 (SUTURE) ×2
SUT VIC AB 0 CT1 27XBRD ANBCTR (SUTURE) ×1 IMPLANT
SUT VIC AB 0 CT2 27 (SUTURE) IMPLANT
SUT VIC AB 2-0 CT1 27 (SUTURE) ×2
SUT VIC AB 2-0 CT1 TAPERPNT 27 (SUTURE) ×1 IMPLANT
SUTURE FIBERWR #2 38 T-5 BLUE (SUTURE) IMPLANT
SYR CONTROL 10ML LL (SYRINGE) ×3 IMPLANT
TOWEL OR 17X24 6PK STRL BLUE (TOWEL DISPOSABLE) ×3 IMPLANT
TOWEL OR 17X26 10 PK STRL BLUE (TOWEL DISPOSABLE) ×3 IMPLANT
TUBE CONNECTING 12'X1/4 (SUCTIONS) ×1
TUBE CONNECTING 12X1/4 (SUCTIONS) ×2 IMPLANT
WAND 90 DEG TURBOVAC W/CORD (SURGICAL WAND) ×3 IMPLANT
WATER STERILE IRR 1000ML POUR (IV SOLUTION) ×3 IMPLANT

## 2013-11-22 NOTE — Anesthesia Preprocedure Evaluation (Addendum)
Anesthesia Evaluation  Patient identified by MRN, date of birth, ID band Patient awake    Reviewed: Allergy & Precautions, H&P , NPO status , Patient's Chart, lab work & pertinent test results, reviewed documented beta blocker date and time   History of Anesthesia Complications (+) PONV and history of anesthetic complications  Airway Mallampati: II TM Distance: >3 FB Neck ROM: Full    Dental  (+) Teeth Intact   Pulmonary asthma , neg sleep apnea, pneumonia -, resolved, neg recent URI, former smoker,  breath sounds clear to auscultation        Cardiovascular hypertension, Pt. on medications - angina- CAD, - Past MI and - CHF - dysrhythmias - Valvular Problems/MurmursRhythm:Regular Rate:Normal     Neuro/Psych  Headaches, PSYCHIATRIC DISORDERS Anxiety Depression    GI/Hepatic Neg liver ROS, GERD-  Controlled,  Endo/Other  negative endocrine ROS  Renal/GU negative Renal ROS     Musculoskeletal   Abdominal   Peds  Hematology negative hematology ROS (+)   Anesthesia Other Findings   Reproductive/Obstetrics                       Anesthesia Physical Anesthesia Plan  ASA: II  Anesthesia Plan: General and Regional   Post-op Pain Management:    Induction: Intravenous  Airway Management Planned: Oral ETT  Additional Equipment: None  Intra-op Plan:   Post-operative Plan: Extubation in OR  Informed Consent: I have reviewed the patients History and Physical, chart, labs and discussed the procedure including the risks, benefits and alternatives for the proposed anesthesia with the patient or authorized representative who has indicated his/her understanding and acceptance.   Dental advisory given  Plan Discussed with: CRNA and Surgeon  Anesthesia Plan Comments:         Anesthesia Quick Evaluation

## 2013-11-22 NOTE — Anesthesia Postprocedure Evaluation (Signed)
  Anesthesia Post-op Note  Patient: Patricia Fischer  Procedure(s) Performed: Procedure(s): RIGHT SHOULDER ATHROSCOPY OPEN SUBSCAP REPAIR DELTA-PECTORAL  (Right)  Patient Location: PACU  Anesthesia Type:General and Regional  Level of Consciousness: awake, alert  and oriented  Airway and Oxygen Therapy: Patient Spontanous Breathing and Patient connected to nasal cannula oxygen  Post-op Pain: none  Post-op Assessment: Post-op Vital signs reviewed, Patient's Cardiovascular Status Stable, Respiratory Function Stable, Patent Airway, No signs of Nausea or vomiting and Pain level controlled  Post-op Vital Signs: Reviewed and stable  Complications: No apparent anesthesia complications

## 2013-11-22 NOTE — Transfer of Care (Signed)
Immediate Anesthesia Transfer of Care Note  Patient: Patricia Fischer  Procedure(s) Performed: Procedure(s): RIGHT SHOULDER ATHROSCOPY OPEN SUBSCAP REPAIR DELTA-PECTORAL  (Right)  Patient Location: PACU  Anesthesia Type:General  Level of Consciousness: awake, alert  and oriented  Airway & Oxygen Therapy: Patient connected to nasal cannula oxygen  Post-op Assessment: Report given to PACU RN  Post vital signs: stable  Complications: No apparent anesthesia complications

## 2013-11-22 NOTE — Discharge Instructions (Signed)
Ice the shoulder constantly!  No pushing, pulling, or lifting with the right arm.  Please use the sling to rest the arm across your waist while up and walking around.   Exercises are only the lap slides and wrist and elbow range of motion.    Keep incision clean and dry for five days, then ok to get wet in the shower.  Follow up in two weeks  [id number]

## 2013-11-22 NOTE — Interval H&P Note (Signed)
History and Physical Interval Note:  11/22/2013 3:38 PM  Patricia Fischer  has presented today for surgery, with the diagnosis of RIGHT SHOULDER SUBSCAP RETEAR   The various methods of treatment have been discussed with the patient and family. After consideration of risks, benefits and other options for treatment, the patient has consented to  Procedure(s): RIGHT SHOULDER ATHROSCOPY OPEN SUBSCAP REPAIR DELTA-PECTORAL  (Right) as a surgical intervention .  The patient's history has been reviewed, patient examined, no change in status, stable for surgery.  I have reviewed the patient's chart and labs.  Questions were answered to the patient's satisfaction.     Madoc Holquin,STEVEN R

## 2013-11-22 NOTE — Brief Op Note (Signed)
11/22/2013  5:50 PM  PATIENT:  Patricia Fischer  58 y.o. female  PRE-OPERATIVE DIAGNOSIS:  RIGHT SHOULDER SUBSCAP RETEAR   POST-OPERATIVE DIAGNOSIS:  RIGHT SHOULDER SUBSCAP RETEAR   PROCEDURE:  Procedure(s): RIGHT SHOULDER ATHROSCOPY OPEN SUBSCAP REPAIR DELTA-PECTORAL  (Right)  SURGEON:  Surgeon(s) and Role:    * Augustin Schooling, MD - Primary  PHYSICIAN ASSISTANT:   ASSISTANTS: Ventura Bruns, PA-C   ANESTHESIA:   regional and general  EBL:  Total I/O In: 1600 [I.V.:1600] Out: 150 [Blood:150]  BLOOD ADMINISTERED:none  DRAINS: none   LOCAL MEDICATIONS USED:  MARCAINE     SPECIMEN:  No Specimen  DISPOSITION OF SPECIMEN:  N/A  COUNTS:  YES  TOURNIQUET:  * No tourniquets in log *  DICTATION: .Other Dictation: Dictation Number [id number]  PLAN OF CARE: Admit for overnight observation  PATIENT DISPOSITION:  PACU - hemodynamically stable.   Delay start of Pharmacological VTE agent (>24hrs) due to surgical blood loss or risk of bleeding: not applicable

## 2013-11-22 NOTE — Anesthesia Procedure Notes (Addendum)
Anesthesia Regional Block:  Interscalene brachial plexus block  Pre-Anesthetic Checklist: ,, timeout performed, Correct Patient, Correct Site, Correct Laterality, Correct Procedure, Correct Position, site marked, Risks and benefits discussed,  Surgical consent,  Pre-op evaluation,  At surgeon's request and post-op pain management  Laterality: Upper and Right  Prep: chloraprep       Needles:  Injection technique: Single-shot  Needle Type: Echogenic Stimulator Needle          Additional Needles:  Procedures: ultrasound guided (picture in chart) and nerve stimulator Interscalene brachial plexus block  Nerve Stimulator or Paresthesia:  Response: deltoid, 0.4 mA,   Additional Responses:   Narrative:  Start time: 11/22/2013 1:11 PM End time: 11/22/2013 1:18 PM Injection made incrementally with aspirations every 5 mL.  Performed by: Personally  Anesthesiologist: Ermalene Postin  Additional Notes: H+P and labs reviewed, risks and benefits discussed with patient, procedure tolerated well without complications   Procedure Name: Intubation Date/Time: 11/22/2013 3:57 PM Performed by: Maeola Harman Pre-anesthesia Checklist: Patient identified, Emergency Drugs available, Suction available, Patient being monitored and Timeout performed Patient Re-evaluated:Patient Re-evaluated prior to inductionOxygen Delivery Method: Circle system utilized Preoxygenation: Pre-oxygenation with 100% oxygen Intubation Type: IV induction Ventilation: Mask ventilation without difficulty Laryngoscope Size: Mac and 3 Grade View: Grade II Tube type: Oral Tube size: 7.0 mm Number of attempts: 1 Airway Equipment and Method: Stylet Placement Confirmation: ETT inserted through vocal cords under direct vision,  positive ETCO2 and breath sounds checked- equal and bilateral Secured at: 22 cm Tube secured with: Tape Dental Injury: Teeth and Oropharynx as per pre-operative assessment  Comments: Easy atraumatic  induction and intubation with MAC 3 blade.  Dr. Ermalene Postin verified placement of ETT.  Waldron Session, CRNA

## 2013-11-22 NOTE — Progress Notes (Signed)
PHARMACIST - PHYSICIAN ORDER COMMUNICATION  CONCERNING: P&T Medication Policy on Herbal Medications  DESCRIPTION:  This patient's order for:  Melatonin  has been noted.  This product(s) is classified as an "herbal" or natural product. Due to a lack of definitive safety studies or FDA approval, nonstandard manufacturing practices, plus the potential risk of unknown drug-drug interactions while on inpatient medications, the Pharmacy and Therapeutics Committee does not permit the use of "herbal" or natural products of this type within [facility].   ACTION TAKEN: The pharmacy department is unable to verify this order at this time and your patient has been informed of this safety policy. Please reevaluate patient's clinical condition at discharge and address if the herbal or natural product(s) should be resumed at that time.  Nena Jordan, PharmD, BCPS 11/22/2013, 8:16 PM

## 2013-11-23 LAB — URINALYSIS, ROUTINE W REFLEX MICROSCOPIC
Bilirubin Urine: NEGATIVE
Glucose, UA: NEGATIVE mg/dL
HGB URINE DIPSTICK: NEGATIVE
Ketones, ur: NEGATIVE mg/dL
Leukocytes, UA: NEGATIVE
Nitrite: NEGATIVE
PROTEIN: NEGATIVE mg/dL
Specific Gravity, Urine: 1.019 (ref 1.005–1.030)
UROBILINOGEN UA: 0.2 mg/dL (ref 0.0–1.0)
pH: 6 (ref 5.0–8.0)

## 2013-11-23 LAB — HEMOGLOBIN AND HEMATOCRIT, BLOOD
HCT: 35.9 % — ABNORMAL LOW (ref 36.0–46.0)
HEMOGLOBIN: 12.1 g/dL (ref 12.0–15.0)

## 2013-11-23 LAB — BASIC METABOLIC PANEL
BUN: 12 mg/dL (ref 6–23)
CALCIUM: 9.3 mg/dL (ref 8.4–10.5)
CO2: 29 mEq/L (ref 19–32)
Chloride: 98 mEq/L (ref 96–112)
Creatinine, Ser: 0.72 mg/dL (ref 0.50–1.10)
GFR calc Af Amer: >90 mL/min (ref >90)
GLUCOSE: 173 mg/dL — AB (ref 70–99)
Potassium: 4 mEq/L (ref 3.7–5.3)
Sodium: 138 mEq/L (ref 137–147)

## 2013-11-23 NOTE — Progress Notes (Signed)
Utilization Review Completed.Patricia Fischer T3/21/2015  

## 2013-11-23 NOTE — Progress Notes (Signed)
RT  not aware of the new CPAP order for 3/20.  No new order showed and order also did not appear in work list until 0800 3/21.

## 2013-11-23 NOTE — Progress Notes (Signed)
Subjective: 1 Day Post-Op Procedure(s) (LRB): RIGHT SHOULDER ATHROSCOPY OPEN SUBSCAP REPAIR DELTA-PECTORAL  (Right) Patient reports pain as mild.  Pt c/o a headache and urinary frequency.  Objective: Vital signs in last 24 hours: Temp:  [97.2 F (36.2 C)-98.5 F (36.9 C)] 97.3 F (36.3 C) (03/21 0528) Pulse Rate:  [43-104] 79 (03/21 0528) Resp:  [12-25] 16 (03/21 0528) BP: (109-155)/(58-98) 114/58 mmHg (03/21 0528) SpO2:  [94 %-100 %] 96 % (03/21 0528)  Intake/Output from previous day: 03/20 0701 - 03/21 0700 In: 3432 [P.O.:1782; I.V.:1600] Out: 150 [Blood:150] Intake/Output this shift: Total I/O In: 345 [P.O.:320; Other:25] Out: -    Recent Labs  11/22/13 1319 11/23/13 0801  HGB 13.0 12.1    Recent Labs  11/22/13 1319 11/23/13 0801  WBC 7.8  --   RBC 4.43  --   HCT 38.9 35.9*  PLT 188  --     Recent Labs  11/22/13 1319  NA 141  K 4.3  CL 100  CO2 29  BUN 14  CREATININE 0.75  GLUCOSE 101*  CALCIUM 9.5   No results found for this basename: LABPT, INR,  in the last 72 hours  PE:  [place] wound dressed and dry.  NVI at R UE.  Assessment/Plan: 1 Day Post-Op Procedure(s) (LRB): RIGHT SHOULDER ATHROSCOPY OPEN SUBSCAP REPAIR DELTA-PECTORAL  (Right) UA today.  Plan d/c home later today.  Wylene Simmer 11/23/2013, 8:55 AM

## 2013-11-23 NOTE — Op Note (Signed)
NAMEHENLI, HEY NO.:  [id number]  MEDICAL RECORD NO.:  [id number]  LOCATION:  [id number]                        FACILITY:  [facility]  PHYSICIAN:  Doran Heater. Veverly Fells, M.D. DATE OF BIRTH:  11/18/1955  DATE OF PROCEDURE:  11/22/2013 DATE OF DISCHARGE:                              OPERATIVE REPORT   PREOPERATIVE DIAGNOSIS:  Right shoulder subscapularis retear.  POSTOPERATIVE DIAGNOSIS:  Right shoulder subscapularis retear.  PROCEDURE PERFORMED:  Right shoulder arthroscopy with limited intra- articular debridement, as well as arthroscopic examination of the subdeltoid and subacromial space and open subscapularis repair.  ATTENDING SURGEON:  Doran Heater. Veverly Fells, MD.  ASSISTANT:  Charletta Cousin Dixon, [place], who was scrubbed during the entire procedure and necessary for satisfactory completion of surgery.  General anesthesia was used plus interscalene block.  ESTIMATED BLOOD LOSS:  Minimal.  FLUID REPLACED:  1000 mL crystalloid.  INSTRUMENT COUNTS:  Correct.  There were no complications.  Perioperative antibiotics were given.  INDICATIONS:  The patient is a 58 year old female with worsening right anterior shoulder pain following a right shoulder rotator cuff repair and subscap repair.  The patient's pain is progress despite conservative management.  Next, MRI scan has been recently done demonstrating a full- thickness subscapularis tear.  Due to the patient's worsening clinical symptoms and evidence of the subscap tear on scan, we discussed options for management to include re-repair of the subscapularis tendon which was what we recommended for her.  Informed consent was obtained.  DESCRIPTION OF PROCEDURE:  After adequate level of anesthesia was achieved, the patient was positioned in the modified beach-chair position.  Time-out was called.  We did an exam under anesthesia revealing passive external rotation at about 90 degrees, internal rotation to her abdomen  forward elevation 160-170 degrees.  After sterile prep and drape of the shoulder and arm, we entered the shoulder using standard arthroscopic portals including anterior, posterior, and lateral portals.  We found some degenerative fraying of some labral tissue that we debrided using suction shaver, some chondromalacia, we did a gentle tangential chondroplasty on.  There was also some loose suture material that were able to retrieve.  We did see the full- thickness subscapularis tear.  The upper half at least of the subscap was off and was visualized through the arthroscope.  The rotator cuff repair was intact.  Both visualized in the anterior and posterior portals.  Inferiorly, the labrum was intact, and there were no loose bodies in the pouch.  We placed the scope in subacromial space. Thorough bursectomy was performed.  Just we could visualize the rotator cuff which was intact on the dorsal surface.  There were no impinging lesions from the acromion and there was extensive scar tissue around the end of the clavicle from the prior patient's distal clavicle resection. At this point, we concluded the arthroscopic portion of procedure.  We then made a deltopectoral incision started at the coracoid process extending down to the anterior humerus.  Dissection down to the subcutaneous tissues using Bovie identified the cephalic vein took laterally, the deltoid pectoralis taken medially.  We identified, placed our self-retaining retractor, identified the conjoined tendon, retracted that medially and then the subscapularis was visualized.  We placed #  2 Hi-Fi suture x2 in a modified W stitch fashion.  We were able to mobilize the subscapularis with a Cobb elevator and also bluntly using my finger, care taken towards not injuring the brachial plexus. Inspected the rotator cuff, which was intact.  We used that as a rotator interval anchor.  We placed 2 Y-Knot RC anchors at the footprint of  the subscapularis.  These were double-loaded with #2 Hi-Fi sutures.  We brought them up in a mattress fashion through the tendon.  We oversewed the rotator interval and also the corner and inferiorly, so we had a nice solid repair.  I could get to the 90 degrees with a little bit of tension but no gapping and then into the patient's abdomen glided nicely.  We had a complete watertight repair.  We thoroughly irrigated the subdeltoid interval and subpectoral interval and then repaired deltopectoral interval with 0 Vicryl suture followed by 2-0 Vicryl subcutaneous closure and 4-0 Monocryl for skin.  Steri-Strips applied followed by sterile dressing.  The patient tolerated the surgery well.     Doran Heater. Veverly Fells, M.D.     SRN/MEDQ  D:  11/22/2013  T:  11/23/2013  Job:  [id number]

## 2013-11-23 NOTE — Progress Notes (Signed)
Patient UA is negative, per MD pt may be discharged for home.

## 2013-11-23 NOTE — Evaluation (Signed)
Occupational Therapy Evaluation Patient Details Name: Patricia Fischer MRN: [id number] DOB: [birth year] Today's Date: 11/23/2013 Time: 1021-1050 OT Time Calculation (min): 29 min  OT Assessment / Plan / Recommendation History of present illness s/p R arthroscopic open subscapularis repair, this is pt's third repair of the same shoulder.   Clinical Impression   All education completed, see ADL comments.  Pt verbalized and/or demonstrated understanding of instruction.  No further OT needs.    OT Assessment  Patient does not need any further OT services    Follow Up Recommendations  No OT follow up;Supervision - Intermittent    Barriers to Discharge      Equipment Recommendations  None recommended by OT    Recommendations for Other Services    Frequency       Precautions / Restrictions Precautions Precautions: Shoulder Type of Shoulder Precautions: sling at all times, lap slides, no ER, AROM elbow and hand on R  Shoulder Interventions: Shoulder sling/immobilizer;Shoulder abduction pillow Precaution Booklet Issued: Yes (comment) Precaution Comments: reviewed handout Restrictions Weight Bearing Restrictions: Yes (NWB on R UE)   Pertinent Vitals/Pain R shoulder soreness, did not rate, premedicated, repositioned and iced, VSS    ADL  Eating/Feeding: Modified independent Where Assessed - Eating/Feeding: Edge of bed Transfers/Ambulation Related to ADLs: mod I, moves slowly ADL Comments: Instructed in hemitechniques for bathing and dressing, reviewed sling donning/doffing, educated in positioning.  Pt able to demonstrate understanding of lapslides and AROM of elbow and wrist keeping shoulder internally rotated.  Pt sleeps in recliner at home.    OT Diagnosis:    OT Problem List:   OT Treatment Interventions:     OT Goals(Current goals can be found in the care plan section) Acute Rehab OT Goals Patient Stated Goal: good outcome of surgery  Visit Information  Last OT  Received On: 11/23/13 History of Present Illness: s/p R arthroscopic open subscapularis repair, this is pt's third repair of the same shoulder.       [place] expects to be discharged to:: Private residence Living Arrangements: Alone Available Help at Discharge: Family;Available PRN/intermittently Home Equipment: None Prior Function Level of Independence: Independent Communication Communication: No difficulties Dominant Hand: Right         Vision/Perception Vision - History Patient Visual Report: No change from baseline   Cognition  Cognition Arousal/Alertness: Awake/alert Behavior During Therapy: WFL for tasks assessed/performed Overall Cognitive Status: Within Functional Limits for tasks assessed    Extremity/Trunk Assessment Upper Extremity Assessment Upper Extremity Assessment: RUE deficits/detail RUE Deficits / Details: s/p shoulder sx as above limiting shoulder mobility, full AROM elbow to hand RUE: Unable to fully assess due to immobilization RUE Coordination: decreased gross motor Lower Extremity Assessment Lower Extremity Assessment: Overall WFL for tasks assessed     Mobility Bed Mobility Overal bed mobility: Modified Independent Transfers Overall transfer level: Modified independent     Exercise     Balance     End of Session OT - End of Session Activity Tolerance: Patient tolerated treatment well Patient left: in bed;with call bell/phone within reach;with nursing/sitter in room  GO Functional Assessment Tool Used: clinical judgement Functional Limitation: Self care Self Care Current Status (M0102): At least 1 percent but less than 20 percent impaired, limited or restricted Self Care Goal Status (V2536): At least 1 percent but less than 20 percent impaired, limited or restricted Self Care Discharge Status [phone]): At least 1 percent but less than 20 percent impaired, limited or  restricted   Malka So 11/23/2013, 10:51 AM [phone]

## 2013-11-26 ENCOUNTER — Encounter (HOSPITAL_COMMUNITY): Payer: Self-pay | Admitting: Orthopedic Surgery

## 2013-12-01 NOTE — Discharge Summary (Signed)
Physician Discharge Summary   Patient ID: Patricia Fischer MRN: [id number] DOB/AGE: [birth year] 58 y.o.  Admit date: 11/22/2013 Discharge date: 11/23/13  Admission Diagnoses:  Active Problems:   Subscapularis (muscle) sprain   Discharge Diagnoses:  Same   Surgeries: Procedure(s): RIGHT SHOULDER ATHROSCOPY OPEN SUBSCAP REPAIR DELTA-PECTORAL  on 11/22/2013   Consultants: PT/OT  Discharged Condition: Stable  Hospital Course: Patricia Fischer is an 58 y.o. female who was admitted 11/22/2013 with a chief complaint of No chief complaint on file. , and found to have a diagnosis of <principal problem not specified>.  They were brought to the operating room on 11/22/2013 and underwent the above named procedures.    The patient had an uncomplicated hospital course and was stable for discharge.  Recent vital signs:  Filed Vitals:   11/23/13 1306  BP: 97/54  Pulse: 73  Temp: 98.1 F (36.7 C)  Resp: 16    Recent laboratory studies:  Results for orders placed during the hospital encounter of 78/24/23  BASIC METABOLIC PANEL      Result Value Ref Range   Sodium 141  137 - 147 mEq/L   Potassium 4.3  3.7 - 5.3 mEq/L   Chloride 100  96 - 112 mEq/L   CO2 29  19 - 32 mEq/L   Glucose, Bld 101 (*) 70 - 99 mg/dL   BUN 14  6 - 23 mg/dL   Creatinine, Ser 0.75  0.50 - 1.10 mg/dL   Calcium 9.5  8.4 - 10.5 mg/dL   GFR calc non Af Amer >90  >90 mL/min   GFR calc Af Amer >90  >90 mL/min  CBC      Result Value Ref Range   WBC 7.8  4.0 - 10.5 K/uL   RBC 4.43  3.87 - 5.11 MIL/uL   Hemoglobin 13.0  12.0 - 15.0 g/dL   HCT 38.9  36.0 - 46.0 %   MCV 87.8  78.0 - 100.0 fL   MCH 29.3  26.0 - 34.0 pg   MCHC 33.4  30.0 - 36.0 g/dL   RDW 13.8  11.5 - 15.5 %   Platelets 188  150 - 400 K/uL  HEMOGLOBIN AND HEMATOCRIT, BLOOD      Result Value Ref Range   Hemoglobin 12.1  12.0 - 15.0 g/dL   HCT 35.9 (*) 36.0 - 53.6 %  BASIC METABOLIC PANEL      Result Value Ref Range   Sodium 138  137 - 147  mEq/L   Potassium 4.0  3.7 - 5.3 mEq/L   Chloride 98  96 - 112 mEq/L   CO2 29  19 - 32 mEq/L   Glucose, Bld 173 (*) 70 - 99 mg/dL   BUN 12  6 - 23 mg/dL   Creatinine, Ser 0.72  0.50 - 1.10 mg/dL   Calcium 9.3  8.4 - 10.5 mg/dL   GFR calc non Af Amer >90  >90 mL/min   GFR calc Af Amer >90  >90 mL/min  URINALYSIS, ROUTINE W REFLEX MICROSCOPIC      Result Value Ref Range   Color, Urine YELLOW  YELLOW   APPearance CLEAR  CLEAR   Specific Gravity, Urine 1.019  1.005 - 1.030   pH 6.0  5.0 - 8.0   Glucose, UA NEGATIVE  NEGATIVE mg/dL   Hgb urine dipstick NEGATIVE  NEGATIVE   Bilirubin Urine NEGATIVE  NEGATIVE   Ketones, ur NEGATIVE  NEGATIVE mg/dL   Protein, ur NEGATIVE  NEGATIVE mg/dL  Urobilinogen, UA 0.2  0.0 - 1.0 mg/dL   Nitrite NEGATIVE  NEGATIVE   Leukocytes, UA NEGATIVE  NEGATIVE  TYPE AND SCREEN      Result Value Ref Range   ABO/RH(D) O POS     Antibody Screen NEG     Sample Expiration 11/25/2013    ABO/RH      Result Value Ref Range   ABO/RH(D) O POS      Discharge Medications:     Medication List         albuterol 108 (90 BASE) MCG/ACT inhaler  Commonly known as:  PROVENTIL HFA;VENTOLIN HFA  Inhale 2 puffs into the lungs every 6 (six) hours as needed for shortness of breath.     cyclobenzaprine 5 MG tablet  Commonly known as:  FLEXERIL  Take 5 mg by mouth 3 (three) times daily as needed. For muscle pain     DULoxetine 30 MG capsule  Commonly known as:  CYMBALTA  Take 120 mg by mouth daily.     gabapentin 300 MG capsule  Commonly known as:  NEURONTIN  Take 300-600 mg by mouth 3 (three) times daily. Takes 300 mg in the morning, 600 mg in the afternoon, & 600 mg at bedtime     HYDROcodone-acetaminophen 7.5-325 MG per tablet  Commonly known as:  NORCO  Take 2 tablets by mouth every 4 (four) hours as needed for moderate pain.     HYDROmorphone 2 MG tablet  Commonly known as:  DILAUDID  Take by mouth every 6 (six) hours as needed for severe pain.      HYDROmorphone 2 MG tablet  Commonly known as:  DILAUDID  Take 1 tablet (2 mg total) by mouth every 4 (four) hours as needed for severe pain.     levalbuterol 45 MCG/ACT inhaler  Commonly known as:  XOPENEX HFA  Inhale 1 puff into the lungs every 4 (four) hours as needed for wheezing.     losartan-hydrochlorothiazide 100-25 MG per tablet  Commonly known as:  HYZAAR  Take 1 tablet by mouth daily.     Melatonin 10 MG Tabs  Take 10 mg by mouth at bedtime as needed (sleep).     methocarbamol 500 MG tablet  Commonly known as:  ROBAXIN  Take 500 mg by mouth daily.        Diagnostic Studies: Dg Chest 2 View  11/22/2013   CLINICAL DATA:  Shoulder surgery, hypertension.  EXAM: CHEST  2 VIEW  COMPARISON:  01/23/2012  FINDINGS: The heart size and mediastinal contours are within normal limits. Both lungs are clear. The visualized skeletal structures are unremarkable.  IMPRESSION: No active cardiopulmonary disease.   Electronically Signed   By: Rolm Baptise M.D.   On: 11/22/2013 13:45   Dg Cervical Spine Complete  11/21/2013   CLINICAL DATA:  Postop cervical spine fusion  EXAM: CERVICAL SPINE  4+ VIEWS  COMPARISON:  MR C-spine of 11/21/2013  FINDINGS: The cervical vertebrae are straightened in alignment. Solid fusion is noted at C5-6. No prevertebral soft tissue swelling is seen. The lung apices are clear. Through flexion and extension there is slightly limited range of motion with no malalignment.  IMPRESSION: 1. Straightened alignment with anterior fusion at C5-6. 2. Limited range of motion through flexion and extension.   Electronically Signed   By: Ivar Drape M.D.   On: 11/21/2013 15:09   Mr Cervical Spine Wo Contrast  11/21/2013   CLINICAL DATA:  Headaches. Right lateral neck pain. Prior cervical  spine surgery.  Patient developed acute shortness of Breath following the injection of IV contrast. This was treated with her own albuterol inhaler and resolved in 5-10 min. The patient's vital signs  were unremarkable. Her oxygen saturation on room air was 94%. She was observed for 30 min prior to discharge in stable condition. No postcontrast imaging was obtained.  BUN and creatinine were obtained on site at [facility] at [address]Results: BUN 12 mg/dL, Creatinine 0.8 mg/dL.  EXAM: MRI CERVICAL SPINE WITHOUT CONTRAST  TECHNIQUE: Multiplanar, multisequence MR imaging was performed. No intravenous contrast was administered.  COMPARISON:  One view lateral cervical spine 09/18/2012  FINDINGS: Normal signal is present in the cervical and upper thoracic spinal cord to the lowest imaged level, T2-3. The patient is fused anteriorly at C5-6. Marrow signal and vertebral body heights are otherwise normal.  The craniocervical junction is within normal limits. The visualized intracranial contents are normal.  C2-3: Mild left-sided facet hypertrophy is present. There is no significant stenosis.  C3-4: Mild uncovertebral spurring is present. There is a broad-based disc osteophyte complex with partial effacement of the ventral CSF. Mild facet hypertrophy is evident bilaterally. Mild bilateral foraminal narrowing is noted.  C4-5: Mild uncovertebral and facet hypertrophy is evident bilaterally. Mild foraminal narrowing results.  C5-6: The patient is fused anteriorly. Mild uncovertebral spurring is noted bilaterally. The foramina are patent.  C6-7:  Negative.  C7-T1:  Negative.  IMPRESSION: 1. Mild foraminal narrowing bilaterally at C3-4 due to a broad-based disc osteophyte complex, uncovertebral spurring and facet hypertrophy. 2. Mild foraminal narrowing at C4-5 is worse on the left. 3. Anterior fusion at C5-6 without evidence for residual or recurrent stenosis. 4. Patient had a significant reaction following the injection of IV contrast with sudden shortness of breath. This resolved with use of an albuterol inhaler.   Electronically Signed   By: Lawrence Santiago M.D.   On: 11/21/2013 14:43    Disposition:  01-Home or Self Care      Discharge Orders   Future Appointments Provider Department Dept Phone   01/15/2014 9:45 AM Robert Bellow, MD [facility] [phone]   Future Orders Complete By Expires   Call MD / Call 911  As directed    Comments:     If you experience chest pain or shortness of breath, CALL 911 and be transported to the hospital emergency room.  If you develope a fever above 101 F, pus (white drainage) or increased drainage or redness at the wound, or calf pain, call your surgeon's office.   Constipation Prevention  As directed    Comments:     Drink plenty of fluids.  Prune juice may be helpful.  You may use a stool softener, such as Colace (over the counter) 100 mg twice a day.  Use MiraLax (over the counter) for constipation as needed.   Diet - low sodium heart healthy  As directed    Increase activity slowly as tolerated  As directed       Follow-up Information   Follow up with NORRIS,STEVEN R, MD. Call in 2 weeks. [phone])    Specialty:  Orthopedic Surgery   Contact information:   [address] [place] [address] [phone]        Signed: Ventura Bruns 12/01/2013, 8:22 PM

## 2014-01-15 ENCOUNTER — Ambulatory Visit: Payer: Self-pay | Admitting: General Surgery

## 2014-04-29 ENCOUNTER — Ambulatory Visit (INDEPENDENT_AMBULATORY_CARE_PROVIDER_SITE_OTHER): Payer: BC Managed Care – PPO | Admitting: Neurology

## 2014-04-29 ENCOUNTER — Encounter: Payer: Self-pay | Admitting: Neurology

## 2014-04-29 VITALS — BP 113/71 | HR 90 | Ht 64.5 in | Wt 239.0 lb

## 2014-04-29 DIAGNOSIS — R51 Headache: Secondary | ICD-10-CM

## 2014-04-29 DIAGNOSIS — R55 Syncope and collapse: Secondary | ICD-10-CM | POA: Insufficient documentation

## 2014-04-29 DIAGNOSIS — R519 Headache, unspecified: Secondary | ICD-10-CM | POA: Insufficient documentation

## 2014-04-29 MED ORDER — TOPIRAMATE 25 MG PO TABS: ORAL_TABLET | ORAL | Status: DC

## 2014-04-29 NOTE — Patient Instructions (Addendum)
  Topamax (topiramate) is a seizure medication that has an FDA approval for seizures and for migraine headache. Potential side effects of this medication include weight loss, cognitive slowing, tingling in the fingers and toes, and carbonated drinks will taste bad. If any significant side effects are noted on this drug, please contact our office.   Cervical Sprain A cervical sprain is when the tissues (ligaments) that hold the neck bones in place stretch or tear. HOME CARE   Put ice on the injured area.  Put ice in a plastic bag.  Place a towel between your skin and the bag.  Leave the ice on for 15-20 minutes, 3-4 times a day.  You may have been given a collar to wear. This collar keeps your neck from moving while you heal.  Do not take the collar off unless told by your doctor.  If you have long hair, keep it outside of the collar.  Ask your doctor before changing the position of your collar. You may need to change its position over time to make it more comfortable.  If you are allowed to take off the collar for cleaning or bathing, follow your doctor's instructions on how to do it safely.  Keep your collar clean by wiping it with mild soap and water. Dry it completely. If the collar has removable pads, remove them every 1-2 days to hand wash them with soap and water. Allow them to air dry. They should be dry before you wear them in the collar.  Do not drive while wearing the collar.  Only take medicine as told by your doctor.  Keep all doctor visits as told.  Keep all physical therapy visits as told.  Adjust your work station so that you have good posture while you work.  Avoid positions and activities that make your problems worse.  Warm up and stretch before being active. GET HELP IF:  Your pain is not controlled with medicine.  You cannot take less pain medicine over time as planned.  Your activity level does not improve as expected. GET HELP RIGHT AWAY IF:    You are bleeding.  Your stomach is upset.  You have an allergic reaction to your medicine.  You develop new problems that you cannot explain.  You lose feeling (become numb) or you cannot move any part of your body (paralysis).  You have tingling or weakness in any part of your body.  Your symptoms get worse. Symptoms include:  Pain, soreness, stiffness, puffiness (swelling), or a burning feeling in your neck.  Pain when your neck is touched.  Shoulder or upper back pain.  Limited ability to move your neck.  Headache.  Dizziness.  Your hands or arms feel week, lose feeling, or tingle.  Muscle spasms.  Difficulty swallowing or chewing. MAKE SURE YOU:   Understand these instructions.  Will watch your condition.  Will get help right away if you are not doing well or get worse. Document Released: 02/08/2008 Document Revised: 04/24/2013 Document Reviewed: 02/27/2013 [facility] Patient Information 2015 [place], [place]. This information is not intended to replace advice given to you by your health care provider. Make sure you discuss any questions you have with your health care provider.

## 2014-04-29 NOTE — Progress Notes (Signed)
Reason for visit: Headache, syncope  Patricia Fischer is a 58 y.o. female  History of present illness:  Patricia Fischer is a 58 year old right-handed white female with a history of migraine headaches. The patient indicates that she has had intermittent headaches since 1999. The patient was under lot of stress at that time around the time of a divorce. The patient improved with her headaches but in 2012, she fell and struck her head with a brief loss of consciousness. Since that time, she has had daily headaches that involve mainly the right occipital area and spreading up into the right frontotemporal region, with retro-orbital pain as well. The patient describes the headache associated with some scalp tenderness as well, and the headaches are daily in nature, but are variable from one time to the next. On average once a week the headaches may become quite severe, and the patient will need to go to bed. The patient reports photophobia without phonophobia, no nausea or vomiting. The patient has had cervical spine surgery following the fall, and she also injured her right shoulder requiring surgery. The patient has significant neck stiffness that worsens when the headache becomes severe. The patient denies any vision changes. She has reported over the last 3 months the episodes of near-syncope have occurred only when the headache is severe. The patient will have onset of some dizziness, and then she will stumble, without actual loss of consciousness, but she does have near-syncope. The she has undergone some physical therapy, and she has engaged in some neuromuscular therapy at home. The patient has received a right occipital nerve injection with some benefit through Dr. Brien Few. She is sent to this office for an evaluation. She reports no focal numbness or weakness of the face, arms, or legs. She denies any problems controlling the bowels or the bladder.  Past Medical History  Diagnosis Date  .  Hypertension   . Asthma     uses MDI prn; no probems x 1 year  . Environmental allergies   . Pneumonia 2012  . Blood transfusion 1979    chidbirth  . GERD (gastroesophageal reflux disease)   . Depression   . Cancer 2011    left breast, DCIS  . Complication of anesthesia     Oxygen saturation drop.  Marland Kitchen PONV (postoperative nausea and vomiting)   . Headache(784.0)     severe since accident in 2012  . Head injury 2011    not sure if she lost conscinouss  . Anxiety   . Hemorrhoids   . Syncope and collapse 04/29/2014  . Obese     Past Surgical History  Procedure Laterality Date  . Breast surgery  2011    left breast lumpectomy  . Cardiac catheterization  2010    [facility]; pt states it was "clear"  . Tonsillectomy    . Appendectomy    . Cholecystectomy    . Tubal ligation    . Anal fissure repair    . Knee cartilage surgery Left   . Carpal tunnel release Bilateral     left hand  . Tarsal tunnel release Left 1990    left foot  . Anterior cervical decomp/discectomy fusion  01/23/2012    Procedure: ANTERIOR CERVICAL DECOMPRESSION/DISCECTOMY FUSION 1 LEVEL;  Surgeon: Elaina Hoops, MD;  Location: [place] [facility];  Service: Neurosurgery;  Laterality: N/A;  Cervical five-six anterior cervical discectomy fusion with plating  . Cesarean section  1982, 1979  . Abdominal hysterectomy  1986  .  Colonoscopy  01/16/2009    Dr Bary Castilla  . Shoulder arthroscopy with subacromial decompression, rotator cuff repair and bicep tendon repair Right 11/22/2013    Procedure: RIGHT SHOULDER ATHROSCOPY OPEN SUBSCAP REPAIR DELTA-PECTORAL ;  Surgeon: Augustin Schooling, MD;  Location: [place];  Service: Orthopedics;  Laterality: Right;    Family History  Problem Relation Age of Onset  . Anesthesia problems Neg Hx   . Breast cancer Paternal Aunt   . Breast cancer Mother   . Migraines Mother   . Rectal cancer Father     Social history:  reports that she has never smoked. She has never used smokeless  tobacco. She reports that she does not drink alcohol or use illicit drugs.  Medications:  Current Outpatient Prescriptions on File Prior to Visit  Medication Sig Dispense Refill  . albuterol (PROVENTIL HFA;VENTOLIN HFA) 108 (90 BASE) MCG/ACT inhaler Inhale 2 puffs into the lungs every 6 (six) hours as needed for shortness of breath.       . DULoxetine (CYMBALTA) 30 MG capsule Take 120 mg by mouth daily.      Marland Kitchen gabapentin (NEURONTIN) 300 MG capsule Take 600 mg by mouth 3 (three) times daily.       Marland Kitchen levalbuterol (XOPENEX HFA) 45 MCG/ACT inhaler Inhale 1 puff into the lungs every 4 (four) hours as needed for wheezing.      Marland Kitchen losartan-hydrochlorothiazide (HYZAAR) 100-25 MG per tablet Take 1 tablet by mouth daily.      . Melatonin 10 MG TABS Take 10 mg by mouth at bedtime as needed (sleep).        No current facility-administered medications on file prior to visit.      Allergies  Allergen Reactions  . Gadolinium Derivatives Shortness Of Breath and Nausea And Vomiting  . Sulfa Antibiotics Swelling    "Ears swelled up like Dumbo"  . Codeine Itching and Rash  . Oxycodone Itching  . Penicillins Hives    ROS:  Out of a complete 14 system review of symptoms, the patient complains only of the following symptoms, and all other reviewed systems are negative.  Itching Blurred vision Increased thirst Muscle cramps, achy muscles Headache, weakness, passing out Depression, anxiety Sleepiness  Blood pressure 113/71, pulse 90, height 5' 4.5" (1.638 m), weight 239 lb (108.41 kg).  Blood pressure, right arm, standing is 118/80. Blood pressure sitting, right arm is 134/84.  Physical Exam  General: The patient is alert and cooperative at the time of the examination. The patient was moderately obese.  Eyes: Pupils are equal, round, and reactive to light. Discs are flat bilaterally.  Neck: The neck is supple, no carotid bruits are noted.  Respiratory: The respiratory examination is  clear.  Cardiovascular: The cardiovascular examination reveals a regular rate and rhythm, no obvious murmurs or rubs are noted.  Neuromuscular: The patient has severe loss of range of movement when turning head to the right, lacks about 30 turn the head to the left.  Skin: Extremities are without significant edema.  Neurologic Exam  Mental status: The patient is alert and oriented x 3 at the time of the examination. The patient has apparent normal recent and remote memory, with an apparently normal attention span and concentration ability.  Cranial nerves: Facial symmetry is present. There is good sensation of the face to pinprick and soft touch bilaterally. The strength of the facial muscles and the muscles to head turning and shoulder shrug are normal bilaterally. Speech is well enunciated, no aphasia or  dysarthria is noted. Extraocular movements are full. Visual fields are full. The tongue is midline, and the patient has symmetric elevation of the soft palate. No obvious hearing deficits are noted.  Motor: The motor testing reveals 5 over 5 strength of all 4 extremities. Good symmetric motor tone is noted throughout.  Sensory: Sensory testing is intact to pinprick, soft touch, vibration sensation, and position sense on all 4 extremities. No evidence of extinction is noted.  Coordination: Cerebellar testing reveals good finger-nose-finger and heel-to-shin bilaterally.  Gait and station: Gait is normal. Tandem gait is normal. Romberg is negative. No drift is seen.  Reflexes: Deep tendon reflexes are symmetric and normal bilaterally. Toes are downgoing bilaterally.   MRI cervical spine 11/21/2013:  IMPRESSION:  1. Mild foraminal narrowing bilaterally at C3-4 due to a broad-based  disc osteophyte complex, uncovertebral spurring and facet  hypertrophy.  2. Mild foraminal narrowing at C4-5 is worse on the left.  3. Anterior fusion at C5-6 without evidence for residual or  recurrent  stenosis.  4. Patient had a significant reaction following the injection of IV  contrast with sudden shortness of breath. This resolved with use of  an albuterol inhaler.     Assessment/Plan:  1. Cervical strain syndrome  2. Cervicogenic headache  3. History of migraine  4. Episodic near syncope  The patient is having a blend of neuromuscular symptoms with cervicogenic headache, and some migraine. In the past, the patient has been on some Topamax with benefit. The occipital nerve injection has helped, and this will be continued. The patient may also benefit from the use of Botox injections. If these severe migraines can be avoided, the episodes of near-syncope are likely to disappear. The patient has undergone MRI evaluation of the brain recently that was normal. MRI of the cervical spine was done in March of 2015. The patient will followup in about 3 months.  Jill Alexanders MD 04/29/2014 8:05 PM  [facility] [address] [place], [place] [address]  Phone [phone] Fax [phone]

## 2014-05-13 ENCOUNTER — Telehealth: Payer: Self-pay | Admitting: Neurology

## 2014-05-13 MED ORDER — TOPIRAMATE 100 MG PO TABS: 100.0000 mg | ORAL_TABLET | Freq: Every day | ORAL | Status: DC

## 2014-05-13 NOTE — Telephone Encounter (Signed)
Message copied by Marcial Pacas on Tue May 13, 2014  3:00 PM ------      Message from: Danford Bad      Created: Fri May 09, 2014 11:21 AM       This patient was denied botox due to not meeting the critieria ------

## 2014-05-13 NOTE — Telephone Encounter (Signed)
Patricia Fischer: Please let patient's known, her Botox request was denied by her insurance company

## 2014-05-13 NOTE — Telephone Encounter (Signed)
I called patient. I indicated that the Botox has been denied. I will increase the Topamax as she believes that she is getting some benefit with this. We will go to 100 mg at night.

## 2014-05-29 ENCOUNTER — Telehealth: Payer: Self-pay | Admitting: Neurology

## 2014-05-29 NOTE — Telephone Encounter (Signed)
Patient states topiramate (TOPAMAX) 100 MG tablet only helps for a short period of time.  Questioning if he could increase to 120 mg.  Please call anytime and leave detailed message if not available.  Pt does have 20 mg tablets of Topomax to last at least three weeks.

## 2014-07-28 ENCOUNTER — Encounter: Payer: Self-pay | Admitting: Neurology

## 2014-07-28 ENCOUNTER — Ambulatory Visit (INDEPENDENT_AMBULATORY_CARE_PROVIDER_SITE_OTHER): Payer: BC Managed Care – PPO | Admitting: Neurology

## 2014-07-28 VITALS — BP 125/80 | HR 89 | Ht 65.0 in | Wt 236.0 lb

## 2014-07-28 DIAGNOSIS — G441 Vascular headache, not elsewhere classified: Secondary | ICD-10-CM

## 2014-07-28 MED ORDER — TOPIRAMATE 25 MG PO TABS: ORAL_TABLET | ORAL | Status: DC

## 2014-07-28 MED ORDER — ARIPIPRAZOLE 2 MG PO TABS: 2.0000 mg | ORAL_TABLET | Freq: Every day | ORAL | Status: DC

## 2014-07-28 NOTE — Progress Notes (Signed)
Reason for visit: Headache  Patricia Fischer is an 58 y.o. female  History of present illness:  Patricia Fischer is a 58 year old right-handed white female with a history of chronic daily headaches. The patient was set up for Botox, but her insurance did not approve it. She will be changing insurance companies in 2016, and it is possible that the Botox may be covered at that time. The patient indicates that the Topamax does help. She takes the Topamax in the evening, and her headache is not a problem until the midafternoon the next day. The patient is also on low-dose amitriptyline and 120 mg daily of Cymbalta. The patient has had some increased problems with depression over the last month. The patient reports some pulling sensations in the left cheek, and some numbness in both hands associated with bad headaches. She returns to this office for an evaluation. She is tolerating the Topamax well.  Past Medical History  Diagnosis Date  . Hypertension   . Asthma     uses MDI prn; no probems x 1 year  . Environmental allergies   . Pneumonia 2012  . Blood transfusion 1979    chidbirth  . GERD (gastroesophageal reflux disease)   . Depression   . Cancer 2011    left breast, DCIS  . Complication of anesthesia     Oxygen saturation drop.  Marland Kitchen PONV (postoperative nausea and vomiting)   . Headache(784.0)     severe since accident in 2012  . Head injury 2011    not sure if she lost conscinouss  . Anxiety   . Hemorrhoids   . Syncope and collapse 04/29/2014  . Obese     Past Surgical History  Procedure Laterality Date  . Breast surgery  2011    left breast lumpectomy  . Cardiac catheterization  2010    [facility]; pt states it was "clear"  . Tonsillectomy    . Appendectomy    . Cholecystectomy    . Tubal ligation    . Anal fissure repair    . Knee cartilage surgery Left   . Carpal tunnel release Bilateral     left hand  . Tarsal tunnel release Left 1990    left foot  .  Anterior cervical decomp/discectomy fusion  01/23/2012    Procedure: ANTERIOR CERVICAL DECOMPRESSION/DISCECTOMY FUSION 1 LEVEL;  Surgeon: Elaina Hoops, MD;  Location: [place] [facility];  Service: Neurosurgery;  Laterality: N/A;  Cervical five-six anterior cervical discectomy fusion with plating  . Cesarean section  1982, 1979  . Abdominal hysterectomy  1986  . Colonoscopy  01/16/2009    Dr Bary Castilla  . Shoulder arthroscopy with subacromial decompression, rotator cuff repair and bicep tendon repair Right 11/22/2013    Procedure: RIGHT SHOULDER ATHROSCOPY OPEN SUBSCAP REPAIR DELTA-PECTORAL ;  Surgeon: Augustin Schooling, MD;  Location: [place];  Service: Orthopedics;  Laterality: Right;    Family History  Problem Relation Age of Onset  . Anesthesia problems Neg Hx   . Breast cancer Paternal Aunt   . Breast cancer Mother   . Migraines Mother   . Rectal cancer Father     Social history:  reports that she has never smoked. She has never used smokeless tobacco. She reports that she does not drink alcohol or use illicit drugs.    Allergies  Allergen Reactions  . Gadolinium Derivatives Shortness Of Breath and Nausea And Vomiting  . Sulfa Antibiotics Swelling    "Ears swelled up like Dumbo"  .  Codeine Itching and Rash  . Oxycodone Itching  . Penicillins Hives    Medications:  Current Outpatient Prescriptions on File Prior to Visit  Medication Sig Dispense Refill  . albuterol (PROVENTIL HFA;VENTOLIN HFA) 108 (90 BASE) MCG/ACT inhaler Inhale 2 puffs into the lungs every 6 (six) hours as needed for shortness of breath.     . DULoxetine (CYMBALTA) 30 MG capsule Take 120 mg by mouth daily.    Marland Kitchen gabapentin (NEURONTIN) 300 MG capsule Take 600 mg by mouth 3 (three) times daily.     Marland Kitchen levalbuterol (XOPENEX HFA) 45 MCG/ACT inhaler Inhale 1 puff into the lungs every 4 (four) hours as needed for wheezing.    Marland Kitchen losartan-hydrochlorothiazide (HYZAAR) 100-25 MG per tablet Take 1 tablet by mouth daily.    Marland Kitchen tiZANidine  (ZANAFLEX) 2 MG tablet Take 4 mg by mouth at bedtime.     . topiramate (TOPAMAX) 100 MG tablet Take 1 tablet (100 mg total) by mouth at bedtime. 30 tablet 3   No current facility-administered medications on file prior to visit.    ROS:  Out of a complete 14 system review of symptoms, the patient complains only of the following symptoms, and all other reviewed systems are negative.  Fatigue Runny nose Cough, wheezing Excessive thirst Restless legs, insomnia Sleep talking Achy muscles, neck pain, neck stiffness Bruising easily Headache, numbness, passing out Confusion, depression, anxiety, hyperactivity  Blood pressure 125/80, pulse 89, height 5\' 5"  (1.651 m), weight 236 lb (107.049 kg).  Physical Exam  General: The patient is alert and cooperative at the time of the examination. The patient is moderately obese.  Skin: No significant peripheral edema is noted.   Neurologic Exam  Mental status: The patient is oriented x 3.  Cranial nerves: Facial symmetry is present. Speech is normal, no aphasia or dysarthria is noted. Extraocular movements are full. Visual fields are full.  Motor: The patient has good strength in all 4 extremities.  Sensory examination: Soft touch sensation is symmetric on the face, arms, and legs.  Coordination: The patient has good finger-nose-finger and heel-to-shin bilaterally.  Gait and station: The patient has a normal gait. Tandem gait is normal. Romberg is negative. No drift is seen.  Reflexes: Deep tendon reflexes are symmetric.   Assessment/Plan:  1. Migraine headache  2. Depression  The patient will go up on the Topamax taking 25 mg in the morning for one week, and then go to 50 mg in the morning. The patient will remain on 100 mg in the evening of the Topamax. The patient will contact me if she believes that she needs more medication. The patient will be placed on low-dose Abilify, 2 mg in the evening for her depression. She will  follow-up in 4 months.  Jill Alexanders MD 07/28/2014 7:26 PM  [facility] [address] [place], [place] [address]  Phone [phone] Fax [phone]

## 2014-07-28 NOTE — Patient Instructions (Signed)

## 2014-08-20 ENCOUNTER — Other Ambulatory Visit: Payer: Self-pay | Admitting: Neurology

## 2014-09-12 DIAGNOSIS — R319 Hematuria, unspecified: Secondary | ICD-10-CM | POA: Diagnosis not present

## 2014-09-12 DIAGNOSIS — Z87442 Personal history of urinary calculi: Secondary | ICD-10-CM | POA: Diagnosis not present

## 2014-09-12 DIAGNOSIS — R300 Dysuria: Secondary | ICD-10-CM | POA: Diagnosis not present

## 2014-09-12 DIAGNOSIS — M5441 Lumbago with sciatica, right side: Secondary | ICD-10-CM | POA: Diagnosis not present

## 2014-09-12 DIAGNOSIS — N390 Urinary tract infection, site not specified: Secondary | ICD-10-CM | POA: Diagnosis not present

## 2014-09-18 ENCOUNTER — Other Ambulatory Visit: Payer: Self-pay | Admitting: Neurology

## 2014-09-25 DIAGNOSIS — F5101 Primary insomnia: Secondary | ICD-10-CM | POA: Diagnosis not present

## 2014-09-25 DIAGNOSIS — G44221 Chronic tension-type headache, intractable: Secondary | ICD-10-CM | POA: Diagnosis not present

## 2014-09-25 DIAGNOSIS — F329 Major depressive disorder, single episode, unspecified: Secondary | ICD-10-CM | POA: Diagnosis not present

## 2014-09-25 DIAGNOSIS — M5412 Radiculopathy, cervical region: Secondary | ICD-10-CM | POA: Diagnosis not present

## 2014-09-25 DIAGNOSIS — M47812 Spondylosis without myelopathy or radiculopathy, cervical region: Secondary | ICD-10-CM | POA: Diagnosis not present

## 2014-10-03 ENCOUNTER — Ambulatory Visit (INDEPENDENT_AMBULATORY_CARE_PROVIDER_SITE_OTHER): Payer: 59 | Admitting: Adult Health

## 2014-10-03 ENCOUNTER — Encounter: Payer: Self-pay | Admitting: Adult Health

## 2014-10-03 VITALS — BP 112/75 | HR 93 | Ht 65.0 in | Wt 232.0 lb

## 2014-10-03 DIAGNOSIS — G43011 Migraine without aura, intractable, with status migrainosus: Secondary | ICD-10-CM | POA: Diagnosis not present

## 2014-10-03 DIAGNOSIS — F32A Depression, unspecified: Secondary | ICD-10-CM

## 2014-10-03 DIAGNOSIS — F329 Major depressive disorder, single episode, unspecified: Secondary | ICD-10-CM | POA: Diagnosis not present

## 2014-10-03 DIAGNOSIS — R55 Syncope and collapse: Secondary | ICD-10-CM

## 2014-10-03 MED ORDER — TOPIRAMATE 100 MG PO TABS: ORAL_TABLET | ORAL | Status: DC

## 2014-10-03 MED ORDER — TOPIRAMATE 25 MG PO TABS: 50.0000 mg | ORAL_TABLET | Freq: Every morning | ORAL | Status: DC

## 2014-10-03 NOTE — Progress Notes (Signed)
PATIENT: Patricia Fischer DOB: 02-Nov-1955  REASON FOR VISIT: follow up- migraines, dizziness, near syncope  HISTORY FROM: patient  HISTORY OF PRESENT ILLNESS: Patricia Fischer is a 59 year old right-handed white female with a history of chronic daily headaches. She returns today complaining of that her headaches are getting severe. She states that they will get so severe that she feels that she is going to pass out. She states that the other day  her vision and hearing was going out but she was able to brace her self against the car. This was during one of her severe migraines. These episodes of near syncope occur with severe headaches. She feels that stress is a trigger for her migraines. She is currently moving in with her son until her home is built. She states that her moving has caused some depression.  She is currently taking Topamax, amitriptyline and Cymbalta for her migraines. She reports that her headache is daily. Her headache starts in the occipital region and moves to the frontal region. She confirms photophobia and phonophobia.Confirms nausea denies  vomiting. She does have dizzy that will lead to a near syncopal episodes. She was considered for Botox in the past but insurance did not approve it. She is currently on Topamax, Cymbalta, Nortriptyline and has tried Abilify without success. She tired tramadol in the past but it only helped acutely. She is also on gabapentin for shoulder pain but states that it has not benefited her headache. Dr. Jannifer Franklin started the patient on Abilify to help with depression but the patient could not tolerate the medication due to dry mouth and sores in her mouth. She continues to have depression. Denies thoughts of harming herself or others. She feels that her depression is situational due to her having to live with her son.   HISTORY 07/28/14 (WILLIS): Patricia Fischer is a 59 year old right-handed white female with a history of chronic daily headaches. The patient  was set up for Botox, but her insurance did not approve it. She will be changing insurance companies in 2016, and it is possible that the Botox may be covered at that time. The patient indicates that the Topamax does help. She takes the Topamax in the evening, and her headache is not a problem until the midafternoon the next day. The patient is also on low-dose amitriptyline and 120 mg daily of Cymbalta. The patient has had some increased problems with depression over the last month. The patient reports some pulling sensations in the left cheek, and some numbness in both hands associated with bad headaches. She returns to this office for an evaluation. She is tolerating the Topamax well.  REVIEW OF SYSTEMS: Out of a complete 14 system review of symptoms, the patient complains only of the following symptoms, and all other reviewed systems are negative.  Appetite change, fatigue, runny nose, cough, light sensitivity, blurred vision, cough, insomnia, back pain, dizziness, headache, depression cold intolerance and excessive thirst.  ALLERGIES: Allergies  Allergen Reactions  . Gadolinium Derivatives Shortness Of Breath and Nausea And Vomiting  . Sulfa Antibiotics Swelling    "Ears swelled up like Dumbo"  . Aripiprazole     Dry mouth with sores   . Codeine Itching and Rash  . Oxycodone Itching  . Penicillins Hives    HOME MEDICATIONS: Outpatient Prescriptions Prior to Visit  Medication Sig Dispense Refill  . albuterol (PROVENTIL HFA;VENTOLIN HFA) 108 (90 BASE) MCG/ACT inhaler Inhale 2 puffs into the lungs every 6 (six) hours as needed for shortness  of breath.     Marland Kitchen amitriptyline (ELAVIL) 10 MG tablet Take 10 mg by mouth at bedtime. One to two tabs at bedtime  2  . DULoxetine (CYMBALTA) 30 MG capsule Take 120 mg by mouth daily.    . fluticasone (FLONASE) 50 MCG/ACT nasal spray   11  . gabapentin (NEURONTIN) 300 MG capsule Take 600 mg by mouth 3 (three) times daily.     Marland Kitchen levalbuterol (XOPENEX  HFA) 45 MCG/ACT inhaler Inhale 1 puff into the lungs every 4 (four) hours as needed for wheezing.    Marland Kitchen losartan-hydrochlorothiazide (HYZAAR) 100-25 MG per tablet Take 1 tablet by mouth daily.    Marland Kitchen tiZANidine (ZANAFLEX) 2 MG tablet Take 4 mg by mouth at bedtime.     . topiramate (TOPAMAX) 100 MG tablet TAKE 1 TABLET (100 MG TOTAL) BY MOUTH AT BEDTIME. 90 tablet 0  . topiramate (TOPAMAX) 25 MG tablet Take 2 tablets (50 mg total) by mouth every morning. 60 tablet 3  . ARIPiprazole (ABILIFY) 2 MG tablet Take 1 tablet (2 mg total) by mouth daily. (Patient not taking: Reported on 10/03/2014) 30 tablet 3   No facility-administered medications prior to visit.    PAST MEDICAL HISTORY: Past Medical History  Diagnosis Date  . Hypertension   . Asthma     uses MDI prn; no probems x 1 year  . Environmental allergies   . Pneumonia 2012  . Blood transfusion 1979    chidbirth  . GERD (gastroesophageal reflux disease)   . Depression   . Cancer 2011    left breast, DCIS  . Complication of anesthesia     Oxygen saturation drop.  Marland Kitchen PONV (postoperative nausea and vomiting)   . Headache(784.0)     severe since accident in 2012  . Head injury 2011    not sure if she lost conscinouss  . Anxiety   . Hemorrhoids   . Syncope and collapse 04/29/2014  . Obese     PAST SURGICAL HISTORY: Past Surgical History  Procedure Laterality Date  . Breast surgery  2011    left breast lumpectomy  . Cardiac catheterization  2010    [facility]; pt states it was "clear"  . Tonsillectomy    . Appendectomy    . Cholecystectomy    . Tubal ligation    . Anal fissure repair    . Knee cartilage surgery Left   . Carpal tunnel release Bilateral     left hand  . Tarsal tunnel release Left 1990    left foot  . Anterior cervical decomp/discectomy fusion  01/23/2012    Procedure: ANTERIOR CERVICAL DECOMPRESSION/DISCECTOMY FUSION 1 LEVEL;  Surgeon: Elaina Hoops, MD;  Location: [facility];  Service: Neurosurgery;   Laterality: N/A;  Cervical five-six anterior cervical discectomy fusion with plating  . Cesarean section  1982, 1979  . Abdominal hysterectomy  1986  . Colonoscopy  01/16/2009    Dr Bary Castilla  . Shoulder arthroscopy with subacromial decompression, rotator cuff repair and bicep tendon repair Right 11/22/2013    Procedure: RIGHT SHOULDER ATHROSCOPY OPEN SUBSCAP REPAIR DELTA-PECTORAL ;  Surgeon: Augustin Schooling, MD;  Location: [facility];  Service: Orthopedics;  Laterality: Right;    FAMILY HISTORY: Family History  Problem Relation Age of Onset  . Anesthesia problems Neg Hx   . Breast cancer Paternal Aunt   . Breast cancer Mother   . Migraines Mother   . Rectal cancer Father     SOCIAL HISTORY: History  Social History  . Marital Status: Divorced    Spouse Name: N/A    Number of Children: 2  . Years of Education: college-1   Occupational History  . unemployed    Social History Main Topics  . Smoking status: Never Smoker   . Smokeless tobacco: Never Used  . Alcohol Use: No     Comment: occasional wine  . Drug Use: No  . Sexual Activity: No   Other Topics Concern  . Not on file   Social History Narrative      PHYSICAL EXAM  Filed Vitals:   10/03/14 1035  BP: 112/75  Pulse: 93  Height: 5\' 5"  (1.651 m)  Weight: 232 lb (105.235 kg)   Body mass index is 38.61 kg/(m^2).  Generalized: Well developed, in no acute distress   Neurological examination  Mentation: Alert oriented to time, place, history taking. Follows all commands speech and language fluent Cranial nerve II-XII: Pupils were equal round reactive to light. Extraocular movements were full, visual field were full on confrontational test. Facial sensation and strength were normal. Uvula tongue midline. Head turning and shoulder shrug  were normal and symmetric. Motor: The motor testing reveals 5 over 5 strength of all 4 extremities except 4/5 in the right upper extremity due to shoulder pain. Good symmetric motor  tone is noted throughout.  Sensory: Sensory testing is intact to soft touch on all 4 extremities. No evidence of extinction is noted.  Coordination: Cerebellar testing reveals good finger-nose-finger and heel-to-shin bilaterally.  Gait and station: Gait is normal. Tandem gait is normal. Romberg is negative. No drift is seen.  Reflexes: Deep tendon reflexes are symmetric and normal bilaterally.    DIAGNOSTIC DATA (LABS, IMAGING, TESTING) - I reviewed patient records, labs, notes, testing and imaging myself where available.  Lab Results  Component Value Date   WBC 7.8 11/22/2013   HGB 12.1 11/23/2013   HCT 35.9* 11/23/2013   MCV 87.8 11/22/2013   PLT 188 11/22/2013      Component Value Date/Time   NA 138 11/23/2013 0801   K 4.0 11/23/2013 0801   CL 98 11/23/2013 0801   CO2 29 11/23/2013 0801   GLUCOSE 173* 11/23/2013 0801   BUN 12 11/23/2013 0801   CREATININE 0.72 11/23/2013 0801   CALCIUM 9.3 11/23/2013 0801   GFRNONAA >90 11/23/2013 0801   GFRAA >90 11/23/2013 0801      ASSESSMENT AND PLAN 59 y.o. year old female  has a past medical history of Hypertension; Asthma; Environmental allergies; Pneumonia (2012); Blood transfusion (1979); GERD (gastroesophageal reflux disease); Depression; Cancer (2011); Complication of anesthesia; PONV (postoperative nausea and vomiting); Headache(784.0); Head injury (2011); Anxiety; Hemorrhoids; Syncope and collapse (04/29/2014); and Obese. here with:  1. Migraine headache 2. Depression  The patient's migraines have increased in severity. I will refer the patient for Botox therapy. She has failed several medications and would now qualify for Botox. She will continue taking Topamax, nortriptyline and Cymbalta. I will refill Topamax today. The patient continues to battle depression. She feels that currently her depression is exacerbated because of her living situation.she is currently on and has tired several medications in the past that were either  not beneficial or she could not tolerate the medication.  I have recommended that the patient see psychiatry and possible counseling could help with her depression. Right now the patient is not amendable to this. She also does not want to stop the Cymbalta or nortriptyline in order to try something new. For now  we will try to get the patient approved for Botox. Getting her migraines under better control may benefit her depression as well as the near syncopal episodes. If Botox is denied we will then consider adjusting her medication. I did explain to the patient that if her depression got worse and she begin to have thoughts of harming herself or others she should go to the ED. Patient verbalized understanding but assured me that her depression was not that bad. She will follow-up in march with me.     Ward Givens, MSN, NP-C 10/03/2014, 10:54 AM [facility] [address] [place], [place] [address] [phone]  Note: This document was prepared with digital dictation and possible smart phrase technology. Any transcriptional errors that result from this process are unintentional.

## 2014-10-03 NOTE — Progress Notes (Signed)
I have read the note, and I agree with the clinical assessment and plan.  Patricia Fischer   

## 2014-10-03 NOTE — Patient Instructions (Signed)
Continue Topamax, nortriptyline and Cymbalta I will refer you for Botox therapy Consider seeing psychiatry for depression If your symptoms worsen or you develop new symptoms please let us know.

## 2014-11-04 ENCOUNTER — Encounter: Payer: Self-pay | Admitting: Neurology

## 2014-11-04 ENCOUNTER — Ambulatory Visit (INDEPENDENT_AMBULATORY_CARE_PROVIDER_SITE_OTHER): Payer: 59 | Admitting: Neurology

## 2014-11-04 VITALS — BP 151/92 | HR 93 | Ht 65.0 in | Wt 232.8 lb

## 2014-11-04 DIAGNOSIS — G441 Vascular headache, not elsewhere classified: Secondary | ICD-10-CM | POA: Diagnosis not present

## 2014-11-04 DIAGNOSIS — G43711 Chronic migraine without aura, intractable, with status migrainosus: Secondary | ICD-10-CM | POA: Diagnosis not present

## 2014-11-04 HISTORY — DX: Chronic migraine without aura, intractable, with status migrainosus: G43.711

## 2014-11-04 MED ORDER — ONABOTULINUMTOXINA 100 UNITS IJ SOLR
200.0000 [IU] | Freq: Once | INTRAMUSCULAR | Status: AC
  Administered 2014-11-04: 200 [IU] via INTRAMUSCULAR

## 2014-11-04 MED ORDER — AMITRIPTYLINE HCL 25 MG PO TABS: ORAL_TABLET | ORAL | Status: DC

## 2014-11-04 NOTE — Procedures (Signed)
     BOTOX PROCEDURE NOTE FOR MIGRAINE HEADACHE   HISTORY: Patricia Fischer is a 59 year old patient with a history of intractable headaches, the patient indicates that she is having daily headaches throughout the day, coming up the back of the head and around to the front. She has not gained any benefit with oral medications, and she comes in for Botox therapy.   Description of procedure:  The patient was placed in a sitting position. The standard protocol was used for Botox as follows, with 5 units of Botox injected at each site:   -Procerus muscle, midline injection  -Corrugator muscle, bilateral injection  -Frontalis muscle, bilateral injection, with 2 sites each side, medial injection was performed in the upper one third of the frontalis muscle, in the region vertical from the medial inferior edge of the superior orbital rim. The lateral injection was again in the upper one third of the forehead vertically above the lateral limbus of the cornea, 1.5 cm lateral to the medial injection site.  -Temporalis muscle injection, 4 sites, bilaterally. The first injection was 3 cm above the tragus of the ear, second injection site was 1.5 cm to 3 cm up from the first injection site in line with the tragus of the ear. The third injection site was 1.5-3 cm forward between the first 2 injection sites. The fourth injection site was 1.5 cm posterior to the second injection site.  -Occipitalis muscle injection, 3 sites, bilaterally. The first injection was done one half way between the occipital protuberance and the tip of the mastoid process behind the ear. The second injection site was done lateral and superior to the first, 1 fingerbreadth from the first injection. The third injection site was 1 fingerbreadth superiorly and medially from the first injection site.  -Cervical paraspinal muscle injection, 2 sites, bilateral knee first injection site was 1 cm from the midline of the cervical spine, 3 cm  inferior to the lower border of the occipital protuberance. The second injection site was 1.5 cm superiorly and laterally to the first injection site.  -Trapezius muscle injection was performed at 3 sites, bilaterally. The first injection site was in the upper trapezius muscle halfway between the inflection point of the neck, and the acromion. The second injection site was one half way between the acromion and the first injection site. The third injection was done between the first injection site and the inflection point of the neck.   A 200 unit bottle of Botox was used, 155 units were injected, the rest of the Botox was wasted. The patient tolerated the procedure well, there were no complications of the above procedure.  Botox NDC 0223-3612-24 Lot number [id number] Expiration date 6/18

## 2014-11-04 NOTE — Progress Notes (Signed)
Reason for visit: Chronic migraine  Patricia Fischer is an 60 y.o. female  History of present illness:  Patricia Fischer is a 59 year old right-handed white female with a history of chronic migraine headache. The patient is having daily headaches at this time, the headaches are coming up in back of the head, and are going around the head. The headaches are daily in nature, from sun up to sundown. She denies any associated nausea or vomiting. She is not working in part because of the headaches. She has not responded to oral medication therapy so far. Currently she is on tizanidine, Topamax at 150 mg daily dose, and she is on low-dose amitriptyline, only taking 20 mg at night. She is tolerating these medications well, but they are ineffective. She comes in today for Botox injections for treatment of the chronic migraine.  Past Medical History  Diagnosis Date  . Hypertension   . Asthma     uses MDI prn; no probems x 1 year  . Environmental allergies   . Pneumonia 2012  . Blood transfusion 1979    chidbirth  . GERD (gastroesophageal reflux disease)   . Depression   . Cancer 2011    left breast, DCIS  . Complication of anesthesia     Oxygen saturation drop.  Marland Kitchen PONV (postoperative nausea and vomiting)   . Headache(784.0)     severe since accident in 2012  . Head injury 2011    not sure if she lost conscinouss  . Anxiety   . Hemorrhoids   . Syncope and collapse 04/29/2014  . Obese   . Chronic migraine without aura, with intractable migraine, so stated, with status migrainosus 11/04/2014    Past Surgical History  Procedure Laterality Date  . Breast surgery  2011    left breast lumpectomy  . Cardiac catheterization  2010    [facility]; pt states it was "clear"  . Tonsillectomy    . Appendectomy    . Cholecystectomy    . Tubal ligation    . Anal fissure repair    . Knee cartilage surgery Left   . Carpal tunnel release Bilateral     left hand  . Tarsal tunnel release  Left 1990    left foot  . Anterior cervical decomp/discectomy fusion  01/23/2012    Procedure: ANTERIOR CERVICAL DECOMPRESSION/DISCECTOMY FUSION 1 LEVEL;  Surgeon: Elaina Hoops, MD;  Location: [facility];  Service: Neurosurgery;  Laterality: N/A;  Cervical five-six anterior cervical discectomy fusion with plating  . Cesarean section  1982, 1979  . Abdominal hysterectomy  1986  . Colonoscopy  01/16/2009    Dr Bary Castilla  . Shoulder arthroscopy with subacromial decompression, rotator cuff repair and bicep tendon repair Right 11/22/2013    Procedure: RIGHT SHOULDER ATHROSCOPY OPEN SUBSCAP REPAIR DELTA-PECTORAL ;  Surgeon: Augustin Schooling, MD;  Location: [facility];  Service: Orthopedics;  Laterality: Right;    Family History  Problem Relation Age of Onset  . Anesthesia problems Neg Hx   . Breast cancer Paternal Aunt   . Breast cancer Mother   . Migraines Mother   . Rectal cancer Father     Social history:  reports that she has never smoked. She has never used smokeless tobacco. She reports that she does not drink alcohol or use illicit drugs.    Allergies  Allergen Reactions  . Gadolinium Derivatives Shortness Of Breath and Nausea And Vomiting  . Sulfa Antibiotics Swelling    "Ears swelled up  like Dumbo"  . Aripiprazole     Dry mouth with sores   . Codeine Itching and Rash  . Oxycodone Itching  . Penicillins Hives    Medications:  Prior to Admission medications   Medication Sig Start Date End Date Taking? Authorizing Provider  albuterol (PROVENTIL HFA;VENTOLIN HFA) 108 (90 BASE) MCG/ACT inhaler Inhale 2 puffs into the lungs every 6 (six) hours as needed for shortness of breath.     Historical Provider, MD  amitriptyline (ELAVIL) 25 MG tablet One at night for one week, then take 2 tablets at night 11/04/14   Kathrynn Ducking, MD  fluticasone [facility]) 50 MCG/ACT nasal spray  07/04/14   Historical Provider, MD  gabapentin (NEURONTIN) 300 MG capsule Take 600 mg by mouth 3 (three) times  daily.     Historical Provider, MD  levalbuterol (XOPENEX HFA) 45 MCG/ACT inhaler Inhale 1 puff into the lungs every 4 (four) hours as needed for wheezing.    Historical Provider, MD  losartan-hydrochlorothiazide (HYZAAR) 100-25 MG per tablet Take 1 tablet by mouth daily.    Historical Provider, MD  tiZANidine (ZANAFLEX) 2 MG tablet Take 4 mg by mouth at bedtime.  03/24/14   Historical Provider, MD  topiramate (TOPAMAX) 100 MG tablet TAKE 1 TABLET (100 MG TOTAL) BY MOUTH AT BEDTIME. 10/03/14   Ward Givens, NP  topiramate (TOPAMAX) 25 MG tablet Take 2 tablets (50 mg total) by mouth every morning. 10/03/14   Ward Givens, NP    ROS:  Out of a complete 14 system review of symptoms, the patient complains only of the following symptoms, and all other reviewed systems are negative.  Headache Fatigue Neck stiffness  Blood pressure 151/92, pulse 93, height 5\' 5"  (1.651 m), weight 232 lb 12.8 oz (105.597 kg).  Physical Exam  General: The patient is alert and cooperative at the time of the examination. The patient is moderately to markedly obese.   Neuromuscular: The patient is only able to generate about 15 of lateral rotation of the cervical spine bilaterally.  Skin: No significant peripheral edema is noted.   Neurologic Exam  Mental status: The patient is oriented x 3.  Cranial nerves: Facial symmetry is present. Speech is normal, no aphasia or dysarthria is noted. Extraocular movements are full. Visual fields are full.  Motor: The patient has good strength in all 4 extremities.  Sensory examination: Soft touch sensation is symmetric on the face, arms, and legs.  Coordination: The patient has good finger-nose-finger and heel-to-shin bilaterally.  Gait and station: The patient has a normal gait. Tandem gait is normal. Romberg is negative. No drift is seen.  Reflexes: Deep tendon reflexes are symmetric.   Assessment/Plan:  1. Chronic migraine headache  2. Cervical  spondylosis  The patient has come in today for Botox therapy. The amitriptyline will be increased as well taking 25 mg at night for one week, then go to 50 mg at night. The patient will contact me if she is tolerating the medication, but she is still having headaches. She will follow-up in 12 weeks for another Botox injection.  Jill Alexanders MD 11/04/2014 9:28 PM  [facility] [address] [place], [place] [address]  Phone [phone] Fax [phone]

## 2014-11-26 ENCOUNTER — Ambulatory Visit: Payer: BC Managed Care – PPO | Admitting: Adult Health

## 2014-12-12 ENCOUNTER — Telehealth: Payer: Self-pay | Admitting: Neurology

## 2014-12-12 MED ORDER — AMITRIPTYLINE HCL 25 MG PO TABS: ORAL_TABLET | ORAL | Status: DC

## 2014-12-12 MED ORDER — PREDNISONE 5 MG PO TABS: ORAL_TABLET | ORAL | Status: DC

## 2014-12-12 NOTE — Telephone Encounter (Signed)
I called the patient. She has had a significant worsening of her headache over the last 5 days. The Botox injection did not offer much benefit so far. She has never tried steroids for the headache. I will call in a prednisone Dosepak. She is not able to tolerate higher doses than 50 mg at night  Of amitriptyline for her headache. If she continues to have pain, she will call our office, we'll set her up for nerve blocks to be done next week.

## 2014-12-15 ENCOUNTER — Telehealth: Payer: Self-pay | Admitting: Neurology

## 2014-12-15 NOTE — Telephone Encounter (Signed)
Appointment made for 12/16/14 at 4:30 PM.

## 2014-12-15 NOTE — Telephone Encounter (Signed)
Patient is calling in regard to prednisone 5 mg you prescribed for her severe headaches. The medicine do not help her at all.  Please call her back.  Thanks!

## 2014-12-15 NOTE — Telephone Encounter (Signed)
I called patient. I'll try to get her in for nerve blocks for her headache. The prednisone did not offer benefit.

## 2014-12-16 ENCOUNTER — Ambulatory Visit (INDEPENDENT_AMBULATORY_CARE_PROVIDER_SITE_OTHER): Payer: Medicare Other | Admitting: Neurology

## 2014-12-16 ENCOUNTER — Encounter: Payer: Self-pay | Admitting: Neurology

## 2014-12-16 VITALS — BP 136/82 | HR 99

## 2014-12-16 DIAGNOSIS — G43711 Chronic migraine without aura, intractable, with status migrainosus: Secondary | ICD-10-CM

## 2014-12-16 NOTE — Progress Notes (Signed)
The patient comes in today with intractable headaches. She indicates that the headaches are in the right occipital area going to the top of the head. The headaches have not responded so far oral medications or to Botox injections. The patient just had a course of prednisone, this offered no benefit. Prednisone makes her itch. She comes in today for a bupivacaine nerve block procedure for the headache.  The patient will contact our office if the procedure help the headache some. This can be repeated.

## 2014-12-16 NOTE — Procedures (Signed)
    History: Patricia Fischer is a 59 year old patient with intractable headaches. The patient has primarily right occipital and parietal headache that is persistent, daily in nature. The patient has gained no benefit with Botox injections so far, she has been refractory to oral medications. She comes in for bupivacaine nerve blocks for treatment of the headache.    Bupivicaine injection protocol for headache  Bupivacaine 0.5% was injected on the scalp bilaterally at several locations:  -On the occipital area of the head, 3 injections each side, 1 cc per injection at the midpoint between the mastoid process and the occipital protuberance. 2 other injections were done one finger breadth from the initial injection, one at a 10 o'clock position and the other at a 2 o'clock position.  -2 injections of 0.5 cc were done in the temporal regions, 2 fingerbreadths above the tragus of the ear, with the second injection one fingerbreadth posteriorly to the first.  -2 injections were done on the brow, 1 in the medial brow and one over the supraorbital nerve notch, with 0.1 cc for each injection  -1 injection each side of 0.5 cc was done anterior to the tragus of the ear for a trigeminal ganglion injection  The patient tolerated the injections well, no complications of the procedure were noted. Injections were made with a 27-gauge needle.  [facility] number is [id number].   Lot number is [id number]. Expriation date is 07/2016.

## 2014-12-30 DIAGNOSIS — J029 Acute pharyngitis, unspecified: Secondary | ICD-10-CM | POA: Diagnosis not present

## 2014-12-30 DIAGNOSIS — R05 Cough: Secondary | ICD-10-CM | POA: Diagnosis not present

## 2014-12-30 DIAGNOSIS — I10 Essential (primary) hypertension: Secondary | ICD-10-CM | POA: Diagnosis not present

## 2014-12-30 DIAGNOSIS — J45909 Unspecified asthma, uncomplicated: Secondary | ICD-10-CM | POA: Diagnosis not present

## 2015-01-21 ENCOUNTER — Telehealth: Payer: Self-pay | Admitting: Neurology

## 2015-01-21 ENCOUNTER — Encounter: Payer: Self-pay | Admitting: Neurology

## 2015-01-21 ENCOUNTER — Ambulatory Visit (INDEPENDENT_AMBULATORY_CARE_PROVIDER_SITE_OTHER): Payer: Medicare Other | Admitting: Neurology

## 2015-01-21 VITALS — BP 136/86 | HR 101

## 2015-01-21 DIAGNOSIS — G441 Vascular headache, not elsewhere classified: Secondary | ICD-10-CM

## 2015-01-21 MED ORDER — AMITRIPTYLINE HCL 25 MG PO TABS: ORAL_TABLET | ORAL | Status: DC

## 2015-01-21 NOTE — Progress Notes (Signed)
   History: Patricia Fischer is a 59 year old patient with  intractable headaches. The patient has primarily right occipital  and parietal headache that is persistent, daily in nature. The  patient has gained no benefit with Botox injections. Previously, she seemed to gain good benefit with the Marcaine injections done in April 2016. She returns for further therapy, she mainly has discomfort in the right occipital region at this time.    Bupivicaine injection protocol for headache  Bupivacaine 0.5% was injected on the scalp on the right at several locations:  -On the occipital area of the head, 3 injections each side, 1 cc per injection at the midpoint between the mastoid process and the occipital protuberance. 2 other injections were done one finger breadth from the initial injection, one at a 10 o'clock position and the other at a 2 o'clock position.  A trigger point injection was done in the upper cervical paraspinal area on the right, 1 mL.   The patient tolerated the injections well, no complications of the procedure were noted. Injections were made with a 27-gauge needle.  [place] number is [id number].  Lot number is [id number]. Expriation date is December 2017.

## 2015-01-21 NOTE — Telephone Encounter (Signed)
I called the patient. She stated that the nerve block Dr. Jannifer Franklin did on the right side about a month ago really helped and she wanted to come in for another nerve block this afternoon d/t having a really bad migraine for a couple days. I discussed this with Dr. Jannifer Franklin and he approved. The patient will come in at 4:30.

## 2015-01-21 NOTE — Telephone Encounter (Signed)
Pt called, headaches have increased past 3 wks. Today is worse. Wants an injection. She says it is all on right side. Would like to be worked in to have injection today. Please call to advise. Sharene can reached at [phone].[place]

## 2015-02-10 ENCOUNTER — Ambulatory Visit (INDEPENDENT_AMBULATORY_CARE_PROVIDER_SITE_OTHER): Payer: Medicare Other | Admitting: Neurology

## 2015-02-10 ENCOUNTER — Telehealth: Payer: Self-pay | Admitting: Neurology

## 2015-02-10 ENCOUNTER — Encounter: Payer: Self-pay | Admitting: Neurology

## 2015-02-10 VITALS — BP 116/77 | HR 104

## 2015-02-10 DIAGNOSIS — G441 Vascular headache, not elsewhere classified: Secondary | ICD-10-CM | POA: Diagnosis not present

## 2015-02-10 NOTE — Telephone Encounter (Signed)
Patient called and requested to speak with the nurse, she would like to know if she can be worked in to receive an injection this afternoon. Please call and advise.

## 2015-02-10 NOTE — Telephone Encounter (Signed)
I called the patient. She stated the nerve block Dr. Jannifer Franklin did on 5/18 really helped. She would like to come in today for another one. She began having terrible headaches again yesterday. Dr. Jannifer Franklin agreed to another nerve block today. The patient is coming at 4 PM.

## 2015-02-10 NOTE — Progress Notes (Signed)
   History: Patricia Fischer is a 59 year old patient with a history of intractable headache. The patient has pain bilaterally, most significant on the right occipital area, and into the right shoulder including the base of the neck bilaterally. The patient being evaluated and treated for this issue. Bupivacaine injections previously have offered significant improvement for the headache for least 3 weeks. The patient returns for another injection.    Bupivicaine injection protocol for headache  Bupivacaine 0.5% was injected on the scalp at several locations:  -On the occipital area of the head, 3 injections on the right side, 1 cc per injection at the midpoint between the mastoid process and the occipital protuberance. 2 other injections were done one finger breadth from the initial injection, one at a 10 o'clock position and the other at a 2 o'clock position.  - injections were made in the upper cervical paraspinal muscles bilaterally, 1 mL per site.  The patient tolerated the injections well, no complications of the procedure were noted. Injections were made with a 27-gauge needle.  [place] number is [phone].  Lot number is [id number]. Expriation date is 11/03/16.

## 2015-02-17 ENCOUNTER — Telehealth: Payer: Self-pay | Admitting: Neurology

## 2015-02-17 ENCOUNTER — Encounter: Payer: Self-pay | Admitting: Neurology

## 2015-02-17 ENCOUNTER — Ambulatory Visit (INDEPENDENT_AMBULATORY_CARE_PROVIDER_SITE_OTHER): Payer: Medicare Other | Admitting: Neurology

## 2015-02-17 VITALS — BP 124/79 | HR 109

## 2015-02-17 DIAGNOSIS — G441 Vascular headache, not elsewhere classified: Secondary | ICD-10-CM

## 2015-02-17 MED ORDER — BETAMETHASONE SOD PHOS & ACET 6 (3-3) MG/ML IJ SUSP
9.0000 mg | Freq: Once | INTRAMUSCULAR | Status: AC
  Administered 2015-02-17: 9 mg via INTRAMUSCULAR

## 2015-02-17 NOTE — Telephone Encounter (Signed)
Patient called wanting to know if she can have a nerve block injection for her headache. Please call and advise. Patient can be reached @ [phone]

## 2015-02-17 NOTE — Telephone Encounter (Signed)
I called the patient. She stated she did not get any relief from the nerve block she got last week. She stated that Dr. Jannifer Franklin mentioned he could do another nerve block with a steroid if she was still in pain after about a week. She states she has been in the bed since Sunday with her headaches.

## 2015-02-17 NOTE — Progress Notes (Signed)
Ms. Braver comes in today for an occipital injection for her ongoing headaches. The headaches are unrelenting, severe. Her insurance company has not approved ongoing Botox injections. The patient is on Botox 100 mg at night. We will increase to 150 mg at night. If the occipital nerve injections are not beneficial, the patient will consider Sphenocath treatments for the headache.

## 2015-02-17 NOTE — Telephone Encounter (Signed)
The patient will come in today for a nerve block.

## 2015-02-17 NOTE — Procedures (Signed)
   History: Patricia Fischer is a 59 year old patient with a history of intractable headaches. Her insurance no longer covers Botox injections, and she is having ongoing significant headaches that are projecting from the right occipital area. The patient comes in for a nerve block procedure.   Bupivicaine/betamethasone injection protocol for headache  Bupivacaine 0.5% and betamethasone 1:1 mixture was injected on the scalp on the right at several locations:  -On the occipital area of the head, 3 injections the right side, 1 cc per injection at the midpoint between the mastoid process and the occipital protuberance. 2 other injections of 0.5 cc were done one finger breadth from the initial injection, one at a 10 o'clock position and the other at a 2 o'clock position.  - 2 trigger point injections were done at the upper cervical paraspinal muscles in the suboccipital region bilaterally. 0.5 mL injected at each site.  The patient tolerated the injections well, no complications of the procedure were noted. Injections were made with a 27-gauge needle.  [place] number for bupivacaine is [phone].  Lot number is [id number]. Expriation date is 11/03/2016.   [place] number for betamethasone is [id number]  Lot #[id number].  Expiration date March 2017.

## 2015-02-23 MED ORDER — INDOMETHACIN ER 75 MG PO CPCR: 75.0000 mg | ORAL_CAPSULE | Freq: Two times a day (BID) | ORAL | Status: DC

## 2015-02-23 MED ORDER — TRAMADOL HCL 50 MG PO TABS: 50.0000 mg | ORAL_TABLET | Freq: Three times a day (TID) | ORAL | Status: DC | PRN

## 2015-02-23 NOTE — Addendum Note (Signed)
Addended by: Marcial Pacas on: 02/23/2015 10:44 AM   Modules accepted: Orders

## 2015-02-23 NOTE — Telephone Encounter (Addendum)
Chart reviewed, patient is seen for chronic headaches, had multiple nerve block injection over the past few months, also receiving regular Botox injection, last injection was March 2016  She was given gabapentin, Elavil, prednisone tapering, tizanidine,  Tramadol 50mg  Q8hours prn x 15 tabs, then schedule follow up appt with Dr. Jannifer Franklin.

## 2015-02-23 NOTE — Telephone Encounter (Signed)
Patient called back and requested to speak with [facility] RN regarding coming back in. She states that the injection did not give her any relief. Please call and advise.

## 2015-02-23 NOTE — Addendum Note (Signed)
Addended by: Margette Fast on: 02/23/2015 08:34 PM   Modules accepted: Orders, Medications

## 2015-02-23 NOTE — Telephone Encounter (Signed)
I called the patient. She has not had any relief from the nerve block on 02/17/15. She has been in the bed ever since. It hurts to even move. I explained that Dr. Jannifer Franklin would not be in the office today, but would be back tomorrow. She asked if she could have anything for pain to hold her over until then. Dr. Jannifer Franklin' note from 02/17/15 states he would consider a sphenocath if the injections did not help. Please advise.

## 2015-02-23 NOTE — Telephone Encounter (Signed)
I called the patient. I explained to her that Dr. Krista Blue wrote for Tramadol 50 mg as need every 8 hours. She verbalized understanding. [facility] [place], as the patient requested.

## 2015-02-23 NOTE — Telephone Encounter (Signed)
I called the patient. She continues to have headache. I will try indomethacin, and get her in for Sphenocath.

## 2015-02-24 ENCOUNTER — Ambulatory Visit (INDEPENDENT_AMBULATORY_CARE_PROVIDER_SITE_OTHER): Payer: Medicare Other | Admitting: Neurology

## 2015-02-24 ENCOUNTER — Encounter: Payer: Self-pay | Admitting: Neurology

## 2015-02-24 VITALS — BP 108/70 | HR 100

## 2015-02-24 DIAGNOSIS — G43711 Chronic migraine without aura, intractable, with status migrainosus: Secondary | ICD-10-CM

## 2015-02-24 NOTE — Telephone Encounter (Signed)
I called the patient. She is coming in at 49 PM today.

## 2015-02-24 NOTE — Progress Notes (Signed)
Please refer to Sphenocath procedure note.

## 2015-02-24 NOTE — Procedures (Signed)
    [facility] PROCEDURE NOTE  History: Patricia Fischer is a 59 year old patient with a history of intractable headaches primarily affecting the right occipital region. She has not responded to occipital nerve blocks, she comes in today for a Sphenocath procedure.  Procedure: The patient was placed in the supine position. A temperature strip was added to the cheek area after the area was cleaned with alcohol. The Sphenocath was lubricated with gel, and placed in the right naris. The catheter was inserted above the middle turbinate to the posterior nasal cavity, and then withdrawn 1 cm. The catheter was deployed and rotated approximately 20 towards the nose. 2-1/2 mL of 2% lidocaine was deployed. The patient was asked to swallow during the injection. The patient demonstrated erythema of the sclera of the eye on this side, and an increase in the cheek temperature was noted from 94 F to 94 F. This was repeated again without temperature change.  This process was repeated on the left side, with similar results. The increase in cheek temperature was documented from 27 F to 60 F.  The patient tolerated the procedure well. No complications of the procedure were noted. The patient was kept in the supine position for 8 minutes following the procedure. She was given small sips of water after sitting up following the procedure.  Lidocaine 2% NDC [id number]  Expiration date: 11/19 Lot number: [id number]  Lenor Coffin

## 2015-02-25 ENCOUNTER — Telehealth: Payer: Self-pay | Admitting: Neurology

## 2015-02-25 MED ORDER — INDOMETHACIN 50 MG PO CAPS: 50.0000 mg | ORAL_CAPSULE | Freq: Three times a day (TID) | ORAL | Status: DC

## 2015-02-25 NOTE — Telephone Encounter (Signed)
Patient called as a reminder to get authorization for a medication Dr Jannifer Franklin discussed on OV 02/24/15. She does not remember the name of the medication. Please call and advise. She can be reached at [phone].

## 2015-02-25 NOTE — Telephone Encounter (Signed)
I called the pharmacy to clarify.  They indicate ins does not cover Indomethacin.  I called Ins at [phone].  Spoke with Legrand Como.  He reviewed the patient's policy and said they would need documented trial and failure of three of the following before they will consider covering Indomethacin: Meloxicam, Etodolac, Flurbiprofen, Ibuprofen, Naproxen and Celecoxib.  Would you like to change to any of the formulary alternatives?  Please advise.  Thank you.

## 2015-02-25 NOTE — Telephone Encounter (Signed)
I called patient. The patient was given the extend release indomethacin, this may be why the insurance copy will not cover, I will call and 50 mg capsule. The whole sale price is about $45 for 90 tablets. Indomethacin is being used for a reason, this has a very specific purpose with headache, the other nostril anti-inflammatory medications will not do.

## 2015-02-27 ENCOUNTER — Telehealth: Payer: Self-pay

## 2015-02-27 MED ORDER — TRAMADOL HCL 50 MG PO TABS: 50.0000 mg | ORAL_TABLET | Freq: Three times a day (TID) | ORAL | Status: DC | PRN

## 2015-02-27 NOTE — Telephone Encounter (Signed)
Benay Pillow at 02/27/2015 11:55 AM     Status: Signed       Expand All Collapse All   Patient is calling stating that the patient's insurance will not pay for indomethacin (INDOCIN) 50 MG capsule and that a prior authorization is needed. The patient also needs a refill for traMADol (ULTRAM) [facility] MG tablet [facility] [place] Thank you.        Dr Krista Blue wrote a Rx for Tramadol on 06/20 #15 tablets.  Would you like to refill Tramadol at this time?  Please advise.  Thank you.    Unfortunately, ins still will not cover Indomethacin.  I went online to Goodrx[employer] and was able to obtain a voucher that would reduce the cost of Indomethacin to $20.63 per fill (Member ID [id number] RxGroup [id number] RxBin [id number] [place]).  I will gladly print and fax this to the pharmacy.  I called the patient back.  Got no answer.  Left message regarding discount voucher.

## 2015-02-27 NOTE — Telephone Encounter (Signed)
I would prefer to use indomethacin if possible. If there is a voucher that reduces the cost, we can use this. I will call in a prescription for the Ultram.

## 2015-02-27 NOTE — Telephone Encounter (Signed)
Patient is calling stating that the patient's insurance will not pay for indomethacin (INDOCIN) 50 MG capsule and that a prior authorization is needed. The  patient also needs a refill for traMADol (ULTRAM) 50 MG tablet [facility] [place]. Thank you.

## 2015-03-03 ENCOUNTER — Encounter: Payer: Self-pay | Admitting: Neurology

## 2015-03-03 ENCOUNTER — Ambulatory Visit (INDEPENDENT_AMBULATORY_CARE_PROVIDER_SITE_OTHER): Payer: Medicare Other | Admitting: Neurology

## 2015-03-03 VITALS — BP 118/82 | HR 76

## 2015-03-03 DIAGNOSIS — G43711 Chronic migraine without aura, intractable, with status migrainosus: Secondary | ICD-10-CM

## 2015-03-03 NOTE — Progress Notes (Signed)
Please refer to Sphenocath procedure note.

## 2015-03-03 NOTE — Procedures (Signed)
    [facility] PROCEDURE NOTE  History: Patricia Fischer is a 59 year old patient with a history of intractable migraine headache with right occipital predominance. The patient has gotten a Sphenocath her seizure one week ago, she feels some better with this, her headache is still present, but better, and the patient still has some dizziness. The patient indicates that when the headache pain gets quite severe, she may have syncope. The patient returns for another Sphenocath procedure.  Procedure: The patient was placed in the supine position. A temperature strip was added to the cheek area after the area was cleaned with alcohol. The Sphenocath was lubricated with gel, and placed in the right naris. The catheter was inserted above the middle turbinate to the posterior nasal cavity, and then withdrawn 1 cm. The catheter was deployed and rotated approximately 20 towards the nose. 2-1/2 mL of 2% lidocaine was deployed. The patient was asked to swallow during the injection. The patient demonstrated erythema of the sclera of the eye on this side, and an increase in the cheek temperature was noted from 94 F to 94 F.  This process was repeated on the left side, with similar results. The increase in cheek temperature was documented from 79 F to 37 F.  The patient tolerated the procedure well. No complications of the procedure were noted. The patient was kept in the supine position for 8 minutes following the procedure. She was given small sips of water after sitting up following the procedure.  Lidocaine 2% NDC 16109-604-54  Expiration date: 11/19 Lot number: [id number]  Lenor Coffin

## 2015-03-13 ENCOUNTER — Ambulatory Visit: Payer: Self-pay | Admitting: Neurology

## 2015-03-18 ENCOUNTER — Encounter: Payer: Self-pay | Admitting: Neurology

## 2015-03-18 ENCOUNTER — Telehealth: Payer: Self-pay | Admitting: Neurology

## 2015-03-18 ENCOUNTER — Ambulatory Visit (INDEPENDENT_AMBULATORY_CARE_PROVIDER_SITE_OTHER): Payer: Medicare Other | Admitting: Neurology

## 2015-03-18 VITALS — BP 128/97 | HR 103

## 2015-03-18 DIAGNOSIS — G43711 Chronic migraine without aura, intractable, with status migrainosus: Secondary | ICD-10-CM | POA: Diagnosis not present

## 2015-03-18 DIAGNOSIS — M5481 Occipital neuralgia: Secondary | ICD-10-CM | POA: Diagnosis not present

## 2015-03-18 MED ORDER — ONDANSETRON 4 MG PO TBDP: 4.0000 mg | ORAL_TABLET | Freq: Three times a day (TID) | ORAL | Status: DC | PRN

## 2015-03-18 MED ORDER — BETAMETHASONE SOD PHOS & ACET 6 (3-3) MG/ML IJ SUSP
9.0000 mg | Freq: Once | INTRAMUSCULAR | Status: AC
  Administered 2015-03-18: 9 mg via INTRAMUSCULAR

## 2015-03-18 MED ORDER — TRAMADOL HCL 50 MG PO TABS: 50.0000 mg | ORAL_TABLET | Freq: Three times a day (TID) | ORAL | Status: DC | PRN

## 2015-03-18 NOTE — Procedures (Signed)
    [place] PROCEDURE NOTE  History: Estrella Alcaraz is a 59 year old patient with intractable headache, mainly in the right occipital area projecting forward. The patient is coming in for Sphenocath procedure to treat the headaches. Previously, she will get 4 or 5 days of benefit following the Sphenocath procedure, but she is significantly nauseated for 2 days following the procedure.  Procedure: The patient was placed in the supine position. A temperature strip was added to the cheek area after the area was cleaned with alcohol. The Sphenocath was lubricated with gel, and placed in the right naris. The catheter was inserted above the middle turbinate to the posterior nasal cavity, and then withdrawn 1 cm. The catheter was deployed and rotated approximately 20 towards the nose. 2-1/2 mL of 2% lidocaine was deployed. The patient was asked to swallow during the injection. The patient demonstrated erythema of the sclera of the eye on this side, and an increase in the cheek temperature was noted from 92 F to 92 F.  This process was not done on the left, the patient refused.  The patient tolerated the procedure well. No complications of the procedure were noted. The patient was kept in the supine position for 8 minutes following the procedure. She was given small sips of water after sitting up following the procedure.  Lidocaine 2% NDC 11941-740-81  Expiration date: 11/19 Lot number: [id number]  Lenor Coffin        History: Kaizley Aja is a 59 year old patient with a history of intractable headaches, I'm early with a right occipital component. The headaches become severe, she returns to this office for a right occipital nerve injection.    Bupivicaine and betamethasone injection protocol for headache  Bupivacaine 0.5% and betamethasone 1:1 solution was injected on the scalp on the right at several locations:  -On the occipital area of the head, 3 injections the right side,  1 cc per injection at the midpoint between the mastoid process and the occipital protuberance. 2 other injections were done one finger breadth from the initial injection, one at a 10 o'clock position and the other at a 2 o'clock position.  The patient tolerated the injections well, no complications of the procedure were noted. Injections were made with a 27-gauge needle.  [place] number for bupivacaine is [phone].  Lot number is [id number]. Expriation date is 11/03/16.   Betamethasone   Lot #[id number]  Expiration date March 2017.

## 2015-03-18 NOTE — Telephone Encounter (Signed)
Patient is calling because she needs to schedule an injection for a headache as soon as possible. Please call.

## 2015-03-18 NOTE — Telephone Encounter (Signed)
I called the patient. She is coming today at 4 for a sphenocath.

## 2015-03-18 NOTE — Progress Notes (Signed)
The patient comes back today with ongoing intractable headache pain. The headache has gotten severe, she indicates that the Sphenocath injections helped for about 3 or 4 days, but she may have 2 days of nausea following the procedure. The patient has ongoing predominantly right occipital pain. The patient is not function well secondary to the severe pain.  The patient will be referred to [facility] for second opinion regarding the treatment of headache. The patient will be given a prescription for Ultram, and for Zofran for nausea.

## 2015-04-06 DIAGNOSIS — I82409 Acute embolism and thrombosis of unspecified deep veins of unspecified lower extremity: Secondary | ICD-10-CM

## 2015-04-06 DIAGNOSIS — I2692 Saddle embolus of pulmonary artery without acute cor pulmonale: Secondary | ICD-10-CM

## 2015-04-06 HISTORY — DX: Acute embolism and thrombosis of unspecified deep veins of unspecified lower extremity: I82.409

## 2015-04-06 HISTORY — DX: Saddle embolus of pulmonary artery without acute cor pulmonale: I26.92

## 2015-04-13 ENCOUNTER — Ambulatory Visit (INDEPENDENT_AMBULATORY_CARE_PROVIDER_SITE_OTHER): Payer: Medicare Other | Admitting: Neurology

## 2015-04-13 ENCOUNTER — Encounter: Payer: Self-pay | Admitting: Neurology

## 2015-04-13 ENCOUNTER — Telehealth: Payer: Self-pay | Admitting: Neurology

## 2015-04-13 VITALS — BP 92/70 | HR 92

## 2015-04-13 DIAGNOSIS — M5481 Occipital neuralgia: Secondary | ICD-10-CM | POA: Diagnosis not present

## 2015-04-13 DIAGNOSIS — G43711 Chronic migraine without aura, intractable, with status migrainosus: Secondary | ICD-10-CM

## 2015-04-13 DIAGNOSIS — R55 Syncope and collapse: Secondary | ICD-10-CM | POA: Diagnosis not present

## 2015-04-13 DIAGNOSIS — G441 Vascular headache, not elsewhere classified: Secondary | ICD-10-CM

## 2015-04-13 MED ORDER — BETAMETHASONE SOD PHOS & ACET 6 (3-3) MG/ML IJ SUSP
9.0000 mg | Freq: Once | INTRAMUSCULAR | Status: AC
  Administered 2015-04-13: 9 mg via INTRAMUSCULAR

## 2015-04-13 NOTE — Patient Instructions (Signed)
We will set up an EEG and a cardiac monitor to evaluate the blackout events. I will call with the results.   Migraine Headache A migraine headache is an intense, throbbing pain on one or both sides of your head. A migraine can last for 30 minutes to several hours. CAUSES  The exact cause of a migraine headache is not always known. However, a migraine may be caused when nerves in the brain become irritated and release chemicals that cause inflammation. This causes pain. Certain things may also trigger migraines, such as:  Alcohol.  Smoking.  Stress.  Menstruation.  Aged cheeses.  Foods or drinks that contain nitrates, glutamate, aspartame, or tyramine.  Lack of sleep.  Chocolate.  Caffeine.  Hunger.  Physical exertion.  Fatigue.  Medicines used to treat chest pain (nitroglycerine), birth control pills, estrogen, and some blood pressure medicines. SIGNS AND SYMPTOMS  Pain on one or both sides of your head.  Pulsating or throbbing pain.  Severe pain that prevents daily activities.  Pain that is aggravated by any physical activity.  Nausea, vomiting, or both.  Dizziness.  Pain with exposure to bright lights, loud noises, or activity.  General sensitivity to bright lights, loud noises, or smells. Before you get a migraine, you may get warning signs that a migraine is coming (aura). An aura may include:  Seeing flashing lights.  Seeing bright spots, halos, or zigzag lines.  Having tunnel vision or blurred vision.  Having feelings of numbness or tingling.  Having trouble talking.  Having muscle weakness. DIAGNOSIS  A migraine headache is often diagnosed based on:  Symptoms.  Physical exam.  A CT scan or MRI of your head. These imaging tests cannot diagnose migraines, but they can help rule out other causes of headaches. TREATMENT Medicines may be given for pain and nausea. Medicines can also be given to help prevent recurrent migraines.  HOME CARE  INSTRUCTIONS  Only take over-the-counter or prescription medicines for pain or discomfort as directed by your health care provider. The use of long-term narcotics is not recommended.  Lie down in a dark, quiet room when you have a migraine.  Keep a journal to find out what may trigger your migraine headaches. For example, write down:  What you eat and drink.  How much sleep you get.  Any change to your diet or medicines.  Limit alcohol consumption.  Quit smoking if you smoke.  Get 7-9 hours of sleep, or as recommended by your health care provider.  Limit stress.  Keep lights dim if bright lights bother you and make your migraines worse. SEEK IMMEDIATE MEDICAL CARE IF:   Your migraine becomes severe.  You have a fever.  You have a stiff neck.  You have vision loss.  You have muscular weakness or loss of muscle control.  You start losing your balance or have trouble walking.  You feel faint or pass out.  You have severe symptoms that are different from your first symptoms. MAKE SURE YOU:   Understand these instructions.  Will watch your condition.  Will get help right away if you are not doing well or get worse. Document Released: 08/22/2005 Document Revised: 01/06/2014 Document Reviewed: 04/29/2013 [facility] Patient Information 2015 [place], [place]. This information is not intended to replace advice given to you by your health care provider. Make sure you discuss any questions you have with your health care provider.

## 2015-04-13 NOTE — Telephone Encounter (Signed)
Patient called inquiring if she could be worked in this afternoon for injection. Please call and advise. Patient can be reached at [phone].

## 2015-04-13 NOTE — Progress Notes (Signed)
Reason for visit: Intractable migraine  Patricia Fischer is an 59 y.o. female  History of present illness:  Patricia Fischer is a 59 year old right-handed white female with a history of intractable migraine headache. The patient has had increased headache over the last several days, she reports that she is having multiple syncopal events, she had 4 such events 2 days ago, and another yesterday. The patient indicates that the blackouts seemed to occur with the severe headaches. The patient has had an episode of pneumonia since last seen, she has had some dehydration associated with this, she may have some dizziness when she stands up. The patient has had increased number of syncopal events over the last several months. The patient comes in today for an injection for the headache. She has been referred for a second opinion regarding treatment of headache, she has not yet heard about this.  Past Medical History  Diagnosis Date  . Hypertension   . Asthma     uses MDI prn; no probems x 1 year  . Environmental allergies   . Pneumonia 2012  . Blood transfusion 1979    chidbirth  . GERD (gastroesophageal reflux disease)   . Depression   . Cancer 2011    left breast, DCIS  . Complication of anesthesia     Oxygen saturation drop.  Marland Kitchen PONV (postoperative nausea and vomiting)   . Headache(784.0)     severe since accident in 2012  . Head injury 2011    not sure if she lost conscinouss  . Anxiety   . Hemorrhoids   . Syncope and collapse 04/29/2014  . Obese   . Chronic migraine without aura, with intractable migraine, so stated, with status migrainosus 11/04/2014    Past Surgical History  Procedure Laterality Date  . Breast surgery  2011    left breast lumpectomy  . Cardiac catheterization  2010    [facility]; pt states it was "clear"  . Tonsillectomy    . Appendectomy    . Cholecystectomy    . Tubal ligation    . Anal fissure repair    . Knee cartilage surgery Left   . Carpal  tunnel release Bilateral     left hand  . Tarsal tunnel release Left 1990    left foot  . Anterior cervical decomp/discectomy fusion  01/23/2012    Procedure: ANTERIOR CERVICAL DECOMPRESSION/DISCECTOMY FUSION 1 LEVEL;  Surgeon: Elaina Hoops, MD;  Location: [place] [facility];  Service: Neurosurgery;  Laterality: N/A;  Cervical five-six anterior cervical discectomy fusion with plating  . Cesarean section  1982, 1979  . Abdominal hysterectomy  1986  . Colonoscopy  01/16/2009    Dr Bary Castilla  . Shoulder arthroscopy with subacromial decompression, rotator cuff repair and bicep tendon repair Right 11/22/2013    Procedure: RIGHT SHOULDER ATHROSCOPY OPEN SUBSCAP REPAIR DELTA-PECTORAL ;  Surgeon: Augustin Schooling, MD;  Location: [facility];  Service: Orthopedics;  Laterality: Right;    Family History  Problem Relation Age of Onset  . Anesthesia problems Neg Hx   . Breast cancer Paternal Aunt   . Breast cancer Mother   . Migraines Mother   . Rectal cancer Father     Social history:  reports that she has never smoked. She has never used smokeless tobacco. She reports that she does not drink alcohol or use illicit drugs.    Allergies  Allergen Reactions  . Gadolinium Derivatives Shortness Of Breath and Nausea And Vomiting  . Sulfa Antibiotics  Swelling    "Ears swelled up like Dumbo"  . Aripiprazole     Dry mouth with sores   . Codeine Itching and Rash  . Oxycodone Itching  . Penicillins Hives    Medications:  Prior to Admission medications   Medication Sig Start Date End Date Taking? Authorizing Provider  albuterol (PROVENTIL HFA;VENTOLIN HFA) 108 (90 BASE) MCG/ACT inhaler Inhale 2 puffs into the lungs every 6 (six) hours as needed for shortness of breath.     Historical Provider, MD  amitriptyline (ELAVIL) 25 MG tablet take 2 tablets at night 01/21/15   Kathrynn Ducking, MD  fluticasone [facility]) 50 MCG/ACT nasal spray  07/04/14   Historical Provider, MD  gabapentin (NEURONTIN) 300 MG capsule Take  600 mg by mouth 3 (three) times daily.     Historical Provider, MD  indomethacin (INDOCIN) 50 MG capsule Take 1 capsule (50 mg total) by mouth 3 (three) times daily with meals. 02/25/15   Kathrynn Ducking, MD  levalbuterol [facility] HFA) 45 MCG/ACT inhaler Inhale 1 puff into the lungs every 4 (four) hours as needed for wheezing.    Historical Provider, MD  losartan-hydrochlorothiazide (HYZAAR) 100-25 MG per tablet Take 1 tablet by mouth daily.    Historical Provider, MD  ondansetron (ZOFRAN ODT) 4 MG disintegrating tablet Take 1 tablet (4 mg total) by mouth every 8 (eight) hours as needed for nausea or vomiting. 03/18/15   Kathrynn Ducking, MD  tiZANidine (ZANAFLEX) 2 MG tablet Take 4 mg by mouth at bedtime.  03/24/14   Historical Provider, MD  topiramate (TOPAMAX) 100 MG tablet TAKE 1 TABLET (100 MG TOTAL) BY MOUTH AT BEDTIME. Patient taking differently: Take 150 mg by mouth at bedtime. TAKE 1 TABLET (100 MG TOTAL) BY MOUTH AT BEDTIME. 10/03/14   Ward Givens, NP  traMADol (ULTRAM) 50 MG tablet Take 1 tablet (50 mg total) by mouth every 8 (eight) hours as needed. 03/18/15   Kathrynn Ducking, MD    ROS:  Out of a complete 14 system review of symptoms, the patient complains only of the following symptoms, and all other reviewed systems are negative.  Headache Syncope Dizziness  Blood pressure 92/70, pulse 92.  Physical Exam  General: The patient is alert and cooperative at the time of the examination. The patient is moderately obese.  Skin: No significant peripheral edema is noted.   Neurologic Exam  Mental status: The patient is alert and oriented x 3 at the time of the examination. The patient has apparent normal recent and remote memory, with an apparently normal attention span and concentration ability.   Cranial nerves: Facial symmetry is present. Speech is normal, no aphasia or dysarthria is noted. Extraocular movements are full. Visual fields are full.  Motor: The patient has  good strength in all 4 extremities.  Sensory examination: Soft touch sensation is symmetric on the face, arms, and legs.  Coordination: The patient has good finger-nose-finger and heel-to-shin bilaterally.  Gait and station: The patient has a normal gait. Tandem gait is normal. Romberg is negative. No drift is seen.  Reflexes: Deep tendon reflexes are symmetric.   Assessment/Plan:  1. Intractable migraine headache  2. Syncopal events  The patient is having multiple syncopal events, having up to 4 a day. The patient seems to be having increasing headaches, she relates the syncopal events to the pain. I will check an EEG study, and a prolonged cardiac monitor. The patient denies palpitations of the heart. She will follow-up in 2-3  months. We will try to get her set up for second opinion regarding treatment of her headache.  Jill Alexanders MD 04/13/2015 7:48 PM  [facility] [address] [place], [place] [address]  Phone [phone] Fax [phone]

## 2015-04-13 NOTE — Procedures (Signed)
   History: Patricia Fischer is a 59 year old patient with a history of intractable migraine that primarily is in the right occipital area projecting forward. The patient has had increased headache recently, comes in for a nerve block procedure.    Xylocaine/ betamethasone 30 mg per 5 mL injection protocol for headache  Xylocaine 2%/ betamethasone 1:1 mixture (3 cc) was injected on the scalp on the right at several locations:  -On the occipital area of the head, 3 injections each side, 0.5 cc per injection at the midpoint between the mastoid process and the occipital protuberance. 2 other injections were done one finger breadth from the initial injection, one at a 10 o'clock position and the other at a 2 o'clock position.  -3 injections along the cervical paraspinal muscles and upper trapezius on the right, 0.5 mL per injection site  The patient tolerated the injections well, no complications of the procedure were noted. Injections were made with a 27-gauge needle.  [place] number is for the Xylocaine is [id number] [id number].  Lot number is [id number]. Expriation date is November 2019.   Betamethasone 30 mg per 5 mL, NDC #9629-528 0-0 1.  Lot # [id number].  Expiration date March 2017.

## 2015-04-13 NOTE — Telephone Encounter (Signed)
I called the patient. She has had a migraine for the past 4-5 days, unrelieved by her oral medications. She would like to come in for a nerve block. I spoke to Dr. Jannifer Franklin. Patient will come in today at 4 PM.

## 2015-04-19 ENCOUNTER — Encounter (HOSPITAL_COMMUNITY): Payer: Self-pay

## 2015-04-19 ENCOUNTER — Emergency Department (HOSPITAL_COMMUNITY): Payer: Medicare Other

## 2015-04-19 ENCOUNTER — Inpatient Hospital Stay (HOSPITAL_COMMUNITY)
Admission: EM | Admit: 2015-04-19 | Discharge: 2015-04-29 | DRG: 175 | Disposition: A | Discharge status: Home / Self Care | Payer: Medicare Other | Attending: Interventional Cardiology | Admitting: Interventional Cardiology

## 2015-04-19 DIAGNOSIS — I82412 Acute embolism and thrombosis of left femoral vein: Secondary | ICD-10-CM | POA: Diagnosis not present

## 2015-04-19 DIAGNOSIS — Z981 Arthrodesis status: Secondary | ICD-10-CM

## 2015-04-19 DIAGNOSIS — Z79899 Other long term (current) drug therapy: Secondary | ICD-10-CM | POA: Diagnosis not present

## 2015-04-19 DIAGNOSIS — I82432 Acute embolism and thrombosis of left popliteal vein: Secondary | ICD-10-CM | POA: Diagnosis present

## 2015-04-19 DIAGNOSIS — I2692 Saddle embolus of pulmonary artery without acute cor pulmonale: Secondary | ICD-10-CM | POA: Diagnosis not present

## 2015-04-19 DIAGNOSIS — M5481 Occipital neuralgia: Secondary | ICD-10-CM | POA: Diagnosis not present

## 2015-04-19 DIAGNOSIS — R570 Cardiogenic shock: Secondary | ICD-10-CM | POA: Insufficient documentation

## 2015-04-19 DIAGNOSIS — E872 Acidosis: Secondary | ICD-10-CM | POA: Diagnosis not present

## 2015-04-19 DIAGNOSIS — N183 Chronic kidney disease, stage 3 (moderate): Secondary | ICD-10-CM | POA: Diagnosis present

## 2015-04-19 DIAGNOSIS — R0600 Dyspnea, unspecified: Secondary | ICD-10-CM | POA: Diagnosis not present

## 2015-04-19 DIAGNOSIS — F419 Anxiety disorder, unspecified: Secondary | ICD-10-CM | POA: Diagnosis present

## 2015-04-19 DIAGNOSIS — I10 Essential (primary) hypertension: Secondary | ICD-10-CM | POA: Diagnosis present

## 2015-04-19 DIAGNOSIS — Z6835 Body mass index (BMI) 35.0-35.9, adult: Secondary | ICD-10-CM

## 2015-04-19 DIAGNOSIS — R42 Dizziness and giddiness: Secondary | ICD-10-CM | POA: Diagnosis not present

## 2015-04-19 DIAGNOSIS — R51 Headache: Secondary | ICD-10-CM

## 2015-04-19 DIAGNOSIS — E669 Obesity, unspecified: Secondary | ICD-10-CM | POA: Diagnosis not present

## 2015-04-19 DIAGNOSIS — R55 Syncope and collapse: Secondary | ICD-10-CM

## 2015-04-19 DIAGNOSIS — R7989 Other specified abnormal findings of blood chemistry: Secondary | ICD-10-CM | POA: Diagnosis not present

## 2015-04-19 DIAGNOSIS — I2699 Other pulmonary embolism without acute cor pulmonale: Secondary | ICD-10-CM | POA: Diagnosis not present

## 2015-04-19 DIAGNOSIS — G43919 Migraine, unspecified, intractable, without status migrainosus: Secondary | ICD-10-CM | POA: Diagnosis not present

## 2015-04-19 DIAGNOSIS — I447 Left bundle-branch block, unspecified: Secondary | ICD-10-CM | POA: Diagnosis present

## 2015-04-19 DIAGNOSIS — R778 Other specified abnormalities of plasma proteins: Secondary | ICD-10-CM

## 2015-04-19 DIAGNOSIS — R519 Headache, unspecified: Secondary | ICD-10-CM | POA: Diagnosis present

## 2015-04-19 DIAGNOSIS — R079 Chest pain, unspecified: Secondary | ICD-10-CM | POA: Diagnosis present

## 2015-04-19 DIAGNOSIS — R091 Pleurisy: Secondary | ICD-10-CM | POA: Diagnosis not present

## 2015-04-19 DIAGNOSIS — I429 Cardiomyopathy, unspecified: Secondary | ICD-10-CM | POA: Diagnosis not present

## 2015-04-19 DIAGNOSIS — J45909 Unspecified asthma, uncomplicated: Secondary | ICD-10-CM | POA: Diagnosis present

## 2015-04-19 DIAGNOSIS — R579 Shock, unspecified: Secondary | ICD-10-CM | POA: Insufficient documentation

## 2015-04-19 DIAGNOSIS — Z853 Personal history of malignant neoplasm of breast: Secondary | ICD-10-CM

## 2015-04-19 DIAGNOSIS — I5023 Acute on chronic systolic (congestive) heart failure: Secondary | ICD-10-CM | POA: Diagnosis not present

## 2015-04-19 DIAGNOSIS — E876 Hypokalemia: Secondary | ICD-10-CM | POA: Diagnosis not present

## 2015-04-19 DIAGNOSIS — I129 Hypertensive chronic kidney disease with stage 1 through stage 4 chronic kidney disease, or unspecified chronic kidney disease: Secondary | ICD-10-CM | POA: Diagnosis present

## 2015-04-19 DIAGNOSIS — IMO0001 Reserved for inherently not codable concepts without codable children: Secondary | ICD-10-CM | POA: Diagnosis present

## 2015-04-19 DIAGNOSIS — I5021 Acute systolic (congestive) heart failure: Secondary | ICD-10-CM | POA: Diagnosis not present

## 2015-04-19 DIAGNOSIS — Z0389 Encounter for observation for other suspected diseases and conditions ruled out: Secondary | ICD-10-CM

## 2015-04-19 DIAGNOSIS — R0781 Pleurodynia: Secondary | ICD-10-CM | POA: Diagnosis not present

## 2015-04-19 DIAGNOSIS — R0602 Shortness of breath: Secondary | ICD-10-CM | POA: Diagnosis not present

## 2015-04-19 DIAGNOSIS — R071 Chest pain on breathing: Secondary | ICD-10-CM | POA: Diagnosis not present

## 2015-04-19 HISTORY — DX: Essential (primary) hypertension: I10

## 2015-04-19 HISTORY — DX: Malignant neoplasm of unspecified site of left female breast: C50.912

## 2015-04-19 LAB — I-STAT TROPONIN, ED: Troponin i, poc: 0.05 ng/mL (ref 0.00–0.08)

## 2015-04-19 LAB — CBC
HCT: 38.3 % (ref 36.0–46.0)
Hemoglobin: 12.8 g/dL (ref 12.0–15.0)
MCH: 28.6 pg (ref 26.0–34.0)
MCHC: 33.4 g/dL (ref 30.0–36.0)
MCV: 85.5 fL (ref 78.0–100.0)
Platelets: 223 10*3/uL (ref 150–400)
RBC: 4.48 MIL/uL (ref 3.87–5.11)
RDW: 13.5 % (ref 11.5–15.5)
WBC: 8.6 10*3/uL (ref 4.0–10.5)

## 2015-04-19 LAB — BASIC METABOLIC PANEL
Anion gap: 10 (ref 5–15)
BUN: 18 mg/dL (ref 6–20)
CO2: 27 mmol/L (ref 22–32)
Calcium: 8.8 mg/dL — ABNORMAL LOW (ref 8.9–10.3)
Chloride: 100 mmol/L — ABNORMAL LOW (ref 101–111)
Creatinine, Ser: 1.21 mg/dL — ABNORMAL HIGH (ref 0.44–1.00)
GFR calc Af Amer: 55 mL/min — ABNORMAL LOW (ref >60)
GFR calc non Af Amer: 48 mL/min — ABNORMAL LOW (ref >60)
Glucose, Bld: 115 mg/dL — ABNORMAL HIGH (ref 65–99)
Potassium: 3 mmol/L — ABNORMAL LOW (ref 3.5–5.1)
Sodium: 137 mmol/L (ref 135–145)

## 2015-04-19 LAB — PROTIME-INR
INR: 1.1 (ref 0.00–1.49)
Prothrombin Time: 14.4 seconds (ref 11.6–15.2)

## 2015-04-19 LAB — TSH: TSH: 0.991 u[IU]/mL (ref 0.350–4.500)

## 2015-04-19 LAB — D-DIMER, QUANTITATIVE: D-Dimer, Quant: 9.54 ug/mL-FEU — ABNORMAL HIGH (ref 0.00–0.48)

## 2015-04-19 LAB — MRSA PCR SCREENING: MRSA by PCR: NEGATIVE

## 2015-04-19 LAB — TROPONIN I
Troponin I: 0.06 ng/mL — ABNORMAL HIGH (ref <0.031)
Troponin I: 0.08 ng/mL — ABNORMAL HIGH (ref <0.031)

## 2015-04-19 MED ORDER — NITROGLYCERIN 0.4 MG SL SUBL: 0.4000 mg | SUBLINGUAL_TABLET | SUBLINGUAL | Status: DC | PRN

## 2015-04-19 MED ORDER — PANTOPRAZOLE SODIUM 40 MG PO TBEC
40.0000 mg | DELAYED_RELEASE_TABLET | Freq: Every day | ORAL | Status: DC
  Administered 2015-04-20 – 2015-04-29 (×10): 40 mg via ORAL
  Filled 2015-04-19 (×10): qty 1

## 2015-04-19 MED ORDER — LOSARTAN POTASSIUM 50 MG PO TABS
100.0000 mg | ORAL_TABLET | Freq: Every day | ORAL | Status: DC
  Administered 2015-04-20 – 2015-04-22 (×3): 100 mg via ORAL
  Filled 2015-04-19 (×4): qty 2

## 2015-04-19 MED ORDER — HEPARIN SODIUM (PORCINE) 5000 UNIT/ML IJ SOLN
5000.0000 [IU] | Freq: Three times a day (TID) | INTRAMUSCULAR | Status: DC
  Administered 2015-04-19 – 2015-04-20 (×2): 5000 [IU] via SUBCUTANEOUS
  Filled 2015-04-19 (×5): qty 1

## 2015-04-19 MED ORDER — SODIUM CHLORIDE 0.9 % IJ SOLN
3.0000 mL | INTRAMUSCULAR | Status: DC | PRN
  Administered 2015-04-19: 3 mL via INTRAVENOUS
  Filled 2015-04-19: qty 3

## 2015-04-19 MED ORDER — SODIUM CHLORIDE 0.9 % IV SOLN: 250.0000 mL | INTRAVENOUS | Status: DC | PRN

## 2015-04-19 MED ORDER — GABAPENTIN 300 MG PO CAPS
600.0000 mg | ORAL_CAPSULE | Freq: Three times a day (TID) | ORAL | Status: DC
  Administered 2015-04-19 – 2015-04-29 (×31): 600 mg via ORAL
  Filled 2015-04-19: qty 2
  Filled 2015-04-19: qty 6
  Filled 2015-04-19 (×36): qty 2

## 2015-04-19 MED ORDER — LEVALBUTEROL TARTRATE 45 MCG/ACT IN AERO: 1.0000 | INHALATION_SPRAY | RESPIRATORY_TRACT | Status: DC | PRN

## 2015-04-19 MED ORDER — ONDANSETRON HCL 4 MG/2ML IJ SOLN: 4.0000 mg | Freq: Four times a day (QID) | INTRAMUSCULAR | Status: DC | PRN

## 2015-04-19 MED ORDER — POTASSIUM CHLORIDE CRYS ER 20 MEQ PO TBCR
20.0000 meq | EXTENDED_RELEASE_TABLET | Freq: Every day | ORAL | Status: DC
  Administered 2015-04-19 – 2015-04-22 (×4): 20 meq via ORAL
  Filled 2015-04-19 (×4): qty 1

## 2015-04-19 MED ORDER — TIZANIDINE HCL 4 MG PO TABS: 4.0000 mg | ORAL_TABLET | Freq: Every day | ORAL | Status: DC

## 2015-04-19 MED ORDER — TOPIRAMATE 25 MG PO TABS
150.0000 mg | ORAL_TABLET | Freq: Every day | ORAL | Status: DC
  Administered 2015-04-19 – 2015-04-28 (×10): 150 mg via ORAL
  Filled 2015-04-19: qty 6
  Filled 2015-04-19 (×11): qty 2

## 2015-04-19 MED ORDER — ASPIRIN 300 MG RE SUPP: 300.0000 mg | RECTAL | Status: AC

## 2015-04-19 MED ORDER — ACETAMINOPHEN 325 MG PO TABS
650.0000 mg | ORAL_TABLET | ORAL | Status: DC | PRN
  Filled 2015-04-19: qty 2

## 2015-04-19 MED ORDER — ASPIRIN EC 81 MG PO TBEC
81.0000 mg | DELAYED_RELEASE_TABLET | Freq: Every day | ORAL | Status: DC
  Administered 2015-04-20 – 2015-04-22 (×3): 81 mg via ORAL
  Filled 2015-04-19 (×4): qty 1

## 2015-04-19 MED ORDER — KETOROLAC TROMETHAMINE 30 MG/ML IJ SOLN
30.0000 mg | Freq: Once | INTRAMUSCULAR | Status: AC
  Administered 2015-04-19: 30 mg via INTRAVENOUS
  Filled 2015-04-19: qty 1

## 2015-04-19 MED ORDER — LOSARTAN POTASSIUM-HCTZ 100-25 MG PO TABS: 1.0000 | ORAL_TABLET | Freq: Every day | ORAL | Status: DC

## 2015-04-19 MED ORDER — ASPIRIN 81 MG PO CHEW: 324.0000 mg | CHEWABLE_TABLET | ORAL | Status: AC

## 2015-04-19 MED ORDER — ZOLPIDEM TARTRATE 5 MG PO TABS
5.0000 mg | ORAL_TABLET | Freq: Every evening | ORAL | Status: DC | PRN
Start: 2015-04-19 — End: 2015-04-29
  Administered 2015-04-23 – 2015-04-27 (×2): 5 mg via ORAL
  Filled 2015-04-19 (×2): qty 1

## 2015-04-19 MED ORDER — SODIUM CHLORIDE 0.9 % IJ SOLN
3.0000 mL | Freq: Two times a day (BID) | INTRAMUSCULAR | Status: DC
  Administered 2015-04-19 – 2015-04-29 (×19): 3 mL via INTRAVENOUS

## 2015-04-19 MED ORDER — ALBUTEROL SULFATE HFA 108 (90 BASE) MCG/ACT IN AERS: 2.0000 | INHALATION_SPRAY | Freq: Four times a day (QID) | RESPIRATORY_TRACT | Status: DC | PRN

## 2015-04-19 MED ORDER — METOPROLOL TARTRATE 12.5 MG HALF TABLET
12.5000 mg | ORAL_TABLET | Freq: Two times a day (BID) | ORAL | Status: DC
  Administered 2015-04-19 – 2015-04-21 (×3): 12.5 mg via ORAL
  Filled 2015-04-19 (×5): qty 1

## 2015-04-19 MED ORDER — HYDROCHLOROTHIAZIDE 25 MG PO TABS
25.0000 mg | ORAL_TABLET | Freq: Every day | ORAL | Status: DC
  Administered 2015-04-20 – 2015-04-22 (×3): 25 mg via ORAL
  Filled 2015-04-19 (×3): qty 1

## 2015-04-19 MED ORDER — ALPRAZOLAM 0.25 MG PO TABS
0.2500 mg | ORAL_TABLET | Freq: Three times a day (TID) | ORAL | Status: DC | PRN
  Administered 2015-04-19 – 2015-04-28 (×16): 0.25 mg via ORAL
  Filled 2015-04-19 (×16): qty 1

## 2015-04-19 MED ORDER — ALBUTEROL SULFATE (2.5 MG/3ML) 0.083% IN NEBU: 2.5000 mg | INHALATION_SOLUTION | Freq: Four times a day (QID) | RESPIRATORY_TRACT | Status: DC | PRN

## 2015-04-19 MED ORDER — ATORVASTATIN CALCIUM 40 MG PO TABS
40.0000 mg | ORAL_TABLET | Freq: Every day | ORAL | Status: DC
  Administered 2015-04-19 – 2015-04-29 (×11): 40 mg via ORAL
  Filled 2015-04-19 (×13): qty 1

## 2015-04-19 MED ORDER — AMITRIPTYLINE HCL 50 MG PO TABS
50.0000 mg | ORAL_TABLET | Freq: Every day | ORAL | Status: DC
  Administered 2015-04-19 – 2015-04-29 (×11): 50 mg via ORAL
  Filled 2015-04-19 (×13): qty 1

## 2015-04-19 MED ORDER — TRAMADOL HCL 50 MG PO TABS
50.0000 mg | ORAL_TABLET | Freq: Four times a day (QID) | ORAL | Status: DC | PRN
  Administered 2015-04-19 – 2015-04-22 (×9): 50 mg via ORAL
  Filled 2015-04-19 (×9): qty 1

## 2015-04-19 NOTE — ED Notes (Signed)
Cardiology at bedside.

## 2015-04-19 NOTE — H&P (Signed)
Patient ID: Patricia Fischer MRN: [id number], DOB/AGE: [birth year]   Admit date: 04/19/2015  Primary Physician: Golden Pop, MD Primary Cardiologist: New  HPI: 59 y/o obese Caucasian female seen in the ED this am after being brought from church by EMS. The pt has no prior CAD history. She was evaluated 10 yrs ago in [place] by Dr Nicolasa Ducking. She had a cath then that showed her coronaries were "OK". This am she got up to shower and had dizziness with diaphoresis and shortness of breath. This happened again while getting dressed and eased spontaneously. On the way into church she had another episode and EMS was called. EKG by EMS showed LBBB with a HR of 124. In the ED her HR is down to 100 and LBBB has resolved and her EKG is similar to past EKG's. She continues to complain of mild chest discomfort. lt sided now with a pleuritic component. NTG did not help. She is anxious and tearful in the ED and admits to high stress level at home. Also other problems including recurrent headaches.   Problem List: Past Medical History  Diagnosis Date  . Essential hypertension   . Asthma   . Environmental allergies   . Pneumonia 2012  . Blood transfusion [place]  . GERD (gastroesophageal reflux disease)   . Depression   . Breast cancer, left breast 2011    DCIS  . Head injury 2011  . Anxiety   . Hemorrhoids   . Obese   . Chronic migraine without aura, with intractable migraine, so stated, with status migrainosus March 2016    Past Surgical History  Procedure Laterality Date  . Breast surgery Left 2011    Breast lumpectomy  . Cardiac catheterization  2010    [facility]; pt states it was "clear"  . Tonsillectomy    . Appendectomy    . Cholecystectomy    . Tubal ligation    . Anal fissure repair    . Knee cartilage surgery Left   . Carpal tunnel release Bilateral   . Tarsal tunnel release Left 1990  . Anterior cervical decomp/discectomy fusion  01/23/2012    Procedure:  ANTERIOR CERVICAL DECOMPRESSION/DISCECTOMY FUSION 1 LEVEL;  Surgeon: Elaina Hoops, MD;  Location: [place] [facility];  Service: Neurosurgery;  Laterality: N/A;  Cervical five-six anterior cervical discectomy fusion with plating  . Cesarean section  1982, 1979  . Abdominal hysterectomy  1986  . Colonoscopy  01/16/2009    Dr Bary Castilla  . Shoulder arthroscopy with subacromial decompression, rotator cuff repair and bicep tendon repair Right 11/22/2013    Procedure: RIGHT SHOULDER ATHROSCOPY OPEN SUBSCAP REPAIR DELTA-PECTORAL ;  Surgeon: Augustin Schooling, MD;  Location: [facility];  Service: Orthopedics;  Laterality: Right;     Allergies:  Allergies  Allergen Reactions  . Gadolinium Derivatives Shortness Of Breath and Nausea And Vomiting  . Sulfa Antibiotics Swelling    "Ears swelled up like Dumbo"  . Aripiprazole     Dry mouth with sores   . Codeine Itching and Rash  . Oxycodone Itching  . Penicillins Hives     Home Medications Current Facility-Administered Medications  Medication Dose Route Frequency Provider Last Rate Last Dose  . 0.9 %  sodium chloride infusion  250 mL Intravenous PRN Erlene Quan, PA-C      . acetaminophen (TYLENOL) tablet 650 mg  650 mg Oral Q4H PRN Doreene Burke Kilroy, PA-C      . albuterol (PROVENTIL  HFA;VENTOLIN HFA) 108 (90 BASE) MCG/ACT inhaler 2 puff  2 puff Inhalation Q6H PRN Erlene Quan, PA-C      . ALPRAZolam Duanne Moron) tablet 0.25 mg  0.25 mg Oral TID PRN Erlene Quan, PA-C      . amitriptyline (ELAVIL) tablet 50 mg  50 mg Oral QHS Luke K Kilroy, PA-C      . aspirin chewable tablet 324 mg  324 mg Oral NOW Erlene Quan, PA-C       Or  . aspirin suppository 300 mg  300 mg Rectal NOW Erlene Quan, PA-C      . [START ON 04/20/2015] aspirin EC tablet 81 mg  81 mg Oral Daily Luke K Kilroy, PA-C      . atorvastatin (LIPITOR) tablet 40 mg  40 mg Oral q1800 Doreene Burke Kilroy, PA-C      . gabapentin (NEURONTIN) capsule 600 mg  600 mg Oral TID Luke K Kilroy, PA-C      . heparin  injection 5,000 Units  5,000 Units Subcutaneous 3 times per day Luke K Kilroy, PA-C      . ketorolac (TORADOL) 30 MG/ML injection 30 mg  30 mg Intravenous Once Luke K Kilroy, PA-C      . levalbuterol [facility] HFA) inhaler 1 puff  1 puff Inhalation Q4H PRN Erlene Quan, PA-C      . [START ON 04/20/2015] losartan-hydrochlorothiazide (HYZAAR) 100-25 MG per tablet 1 tablet  1 tablet Oral Daily Luke K Kilroy, PA-C      . metoprolol tartrate (LOPRESSOR) tablet 12.5 mg  12.5 mg Oral BID Luke K Kilroy, PA-C      . nitroGLYCERIN (NITROSTAT) SL tablet 0.4 mg  0.4 mg Sublingual Q5 Min x 3 PRN Luke K Kilroy, PA-C      . ondansetron [facility]) injection 4 mg  4 mg Intravenous Q6H PRN Erlene Quan, PA-C      . [START ON 04/20/2015] pantoprazole (PROTONIX) EC tablet 40 mg  40 mg Oral Q0600 Doreene Burke Kilroy, PA-C      . sodium chloride 0.9 % injection 3 mL  3 mL Intravenous Q12H Luke K Kilroy, PA-C      . sodium chloride 0.9 % injection 3 mL  3 mL Intravenous PRN Luke K Kilroy, PA-C      . tiZANidine (ZANAFLEX) tablet 4 mg  4 mg Oral QHS Luke K Kilroy, PA-C      . topiramate (TOPAMAX) tablet 150 mg  150 mg Oral QHS Luke K Kilroy, PA-C      . traMADol Veatrice Bourbon) tablet 50 mg  50 mg Oral Q6H PRN Doreene Burke Kilroy, PA-C      . zolpidem (AMBIEN) tablet 5 mg  5 mg Oral QHS PRN Erlene Quan, PA-C       Current Outpatient Prescriptions  Medication Sig Dispense Refill  . albuterol (PROVENTIL HFA;VENTOLIN HFA) 108 (90 BASE) MCG/ACT inhaler Inhale 2 puffs into the lungs every 6 (six) hours as needed for shortness of breath.     Marland Kitchen amitriptyline (ELAVIL) 25 MG tablet take 2 tablets at night 180 tablet 3  . fluticasone (FLONASE) 50 MCG/ACT nasal spray   11  . gabapentin (NEURONTIN) 300 MG capsule Take 600 mg by mouth 3 (three) times daily.     . indomethacin (INDOCIN) 50 MG capsule Take 1 capsule (50 mg total) by mouth 3 (three) times daily with meals. 90 capsule 1  . levalbuterol (XOPENEX HFA) 45 MCG/ACT inhaler Inhale 1 puff into  the lungs every 4 (four) hours as needed for wheezing.    Marland Kitchen losartan-hydrochlorothiazide (HYZAAR) 100-25 MG per tablet Take 1 tablet by mouth daily.    . ondansetron (ZOFRAN ODT) 4 MG disintegrating tablet Take 1 tablet (4 mg total) by mouth every 8 (eight) hours as needed for nausea or vomiting. 30 tablet 2  . tiZANidine (ZANAFLEX) 2 MG tablet Take 4 mg by mouth at bedtime.     . topiramate (TOPAMAX) 100 MG tablet TAKE 1 TABLET (100 MG TOTAL) BY MOUTH AT BEDTIME. (Patient taking differently: Take 150 mg by mouth at bedtime. TAKE 1 TABLET (100 MG TOTAL) BY MOUTH AT BEDTIME.) 90 tablet 3  . traMADol (ULTRAM) 50 MG tablet Take 1 tablet (50 mg total) by mouth every 8 (eight) hours as needed. 60 tablet 1     Family History  Problem Relation Age of Onset  . Anesthesia problems Neg Hx   . Breast cancer Paternal Aunt   . Breast cancer Mother   . Migraines Mother   . Rectal cancer Father      Social History   Social History  . Marital Status: Divorced    Spouse Name: N/A  . Number of Children: 2  . Years of Education: college-1   Occupational History  . Unemployed    Social History Main Topics  . Smoking status: Never Smoker   . Smokeless tobacco: Never Used  . Alcohol Use: No     Comment: occasional wine  . Drug Use: No  . Sexual Activity: No   Other Topics Concern  . Not on file   Social History Narrative     Review of Systems: General: negative for chills, fever, night sweats or weight changes.  Cardiovascular: dyspnea on exertion, edema, orthopnea, palpitations, paroxysmal nocturnal dyspnea  Dermatological: negative for rash Respiratory: negative for cough or wheezing Urologic: negative for hematuria Abdominal: negative for nausea, vomiting, diarrhea, bright red blood per rectum, melena, or hematemesis Neurologic: negative for visual changes, syncope, or dizziness All other systems reviewed and are otherwise negative except as noted above.  Physical Exam: Blood  pressure 126/81, pulse 97, temperature 98.1 F (36.7 C), temperature source Oral, resp. rate 23, height 5\' 5"  (1.651 m), weight 215 lb (97.523 kg), SpO2 91 %.  General appearance: alert, cooperative, no distress, moderately obese and anxious, tearful Neck: no carotid bruit and no JVD Lungs: clear to auscultation bilaterally Heart: regular rate and rhythm Abdomen: soft, non-tender; bowel sounds normal; no masses,  no organomegaly and obese Extremities: extremities normal, atraumatic, no cyanosis or edema Pulses: 2+ and symmetric Skin: Skin color, texture, turgor normal. No rashes or lesions Neurologic: Grossly normal  Labs:   Results for orders placed or performed during the hospital encounter of 04/19/15 (from the past 24 hour(s))  Basic metabolic panel     Status: Abnormal   Collection Time: 04/19/15 11:41 AM  Result Value Ref Range   Sodium 137 135 - 145 mmol/L   Potassium 3.0 (L) 3.5 - 5.1 mmol/L   Chloride 100 (L) 101 - 111 mmol/L   CO2 27 22 - 32 mmol/L   Glucose, Bld 115 (H) 65 - 99 mg/dL   BUN 18 6 - 20 mg/dL   Creatinine, Ser 1.21 (H) 0.44 - 1.00 mg/dL   Calcium 8.8 (L) 8.9 - 10.3 mg/dL   GFR calc non Af Amer 48 (L) >60 mL/min   GFR calc Af Amer 55 (L) >60 mL/min   Anion gap 10 5 - 15  I-Stat Troponin, ED (not at [facility])     Status: None   Collection Time: 04/19/15 11:49 AM  Result Value Ref Range   Troponin i, poc 0.05 0.00 - 0.08 ng/mL   Comment 3             Radiology/Studies: No results found.  EKG:NSR, poor anterior RW, PVCs  ASSESSMENT AND PLAN:  Principal Problem:   Chest pain with moderate risk for cardiac etiology Active Problems:   Headache   LBBB (left bundle branch block)-rate related    Essential hypertension   Normal coronary arteries-10 yrs ago   Obesity-BMI 35   Chest pain   PLAN: Admit to r/o MI, r/o PE, add beta blocker, ASA, statin. Try Toradol x 1 for chest pain. Myoview in am if she rules out. No Heparin unless Troponin turns  positive.   Angelena Form, PA-C 04/19/2015, 12:37 PM [phone]   Attending note:  Patient seen examined. Reviewed available records and discussed the case with Mr. Reino Bellis. Patricia Fischer presents to the ER via EMS with recent onset lightheadedness, diaphoresis, and feeling of rapid heart rate with generalized chest discomfort. These symptoms occurred when she was showering this morning and getting ready for church. She sat down and the symptoms resolved, although recurred later when she was in her car at church, ultimately EMS was summoned and she was found to have a left bundle branch block in the setting of sinus tachycardia by ECG. Initially STEMI protocol was activated, canceled by Dr. Gwenlyn Found on review of her ECG. Prior tracings show sinus rhythm without left bundle branch block, however when her heart rate decreased on evaluation at the [facility] ER, left bundle branch block also resolved raising the question of a rate-related process.  She does not report any history of obstructive CAD or myocardial infarction, also no documented arrhythmia. She states she was evaluated in [place] by Dr. Nicolasa Ducking approximately 10 years ago at which time cardiac catheterization reportedly showed no substantial findings.  She reports other health concerns including chronic, recurring headaches that are followed and managed by Dr. Jannifer Franklin in the neurology clinic. She states that she was due to have an EEG done this week. She has also had episodic syncope of undertermined etiology, chronic neck pain with previous cervical surgery. States that she has been under stress and has trouble with anxiety.  On examination she appears anxious but is in no distress. Complains of having a dry mouth. Lungs are clear without labored breathing. Cardiac exam reveals RRR without gallop or loud murmur. No leg edema noted. Initial troponin I is 0.05. Noted to be hypokalemic with potassium 3.0, creatinine 1.2. She is on  Hyzaar without potassium supplement.  Situation discussed with patient and sister at bedside. Hospitalization recommended for further evaluation of symptoms. We will cycle cardiac markers, obtain an echocardiogram to assess cardiac structure and function, and monitor telemetry for any potential rhythm abnormalities. Presuming she rules out for myocardial infarction and is otherwise stable, Myoview will be obtained tomorrow for follow-up ischemic evaluation. Replete potassium.  Satira Sark, M.D., F.A.C.C.

## 2015-04-19 NOTE — ED Notes (Signed)
Pt. Presents with complaint of dizziness, cold sweats, heart racing, and chest pain. Pt. States CP started yesterday but other symptoms have been going on for months. Pt. States she has hx of chronic migraines. Pt. Given 324 ASA and 1 Nitro en route. Pt. Found to have new L BBB.

## 2015-04-19 NOTE — ED Provider Notes (Signed)
CSN: [id number]     Arrival date & time 04/19/15  1119 History   First MD Initiated Contact with Patient 04/19/15 1126     Chief Complaint  Patient presents with  . Chest Pain   HPI   59 year old female presents today with chest pain. Patient was brought here via EMS for suspected STEMI called by the EMS providers, care link discontinued the STEMI. Upon arrival patient reports that she's having left-sided anterior chest pain, described as "pressure". She reports it started this morning, discontinued briefly while going to church, with resurgence. Patient notes associated nausea and vomiting, radiation into her left shoulder. She reports she did not try anything for the pain. She also notes she is having a headache, describes this is similar to previous, for which she is seeing neurologist. No red flags.   Past Medical History  Diagnosis Date  . Essential hypertension   . Asthma   . Environmental allergies   . Pneumonia 2012  . Blood transfusion [place]  . GERD (gastroesophageal reflux disease)   . Depression   . Breast cancer, left breast 2011    DCIS  . Head injury 2011  . Anxiety   . Hemorrhoids   . Obese   . Chronic migraine without aura, with intractable migraine, so stated, with status migrainosus March 2016   Past Surgical History  Procedure Laterality Date  . Breast surgery Left 2011    Breast lumpectomy  . Cardiac catheterization  2010    [facility]; pt states it was "clear"  . Tonsillectomy    . Appendectomy    . Cholecystectomy    . Tubal ligation    . Anal fissure repair    . Knee cartilage surgery Left   . Carpal tunnel release Bilateral   . Tarsal tunnel release Left 1990  . Anterior cervical decomp/discectomy fusion  01/23/2012    Procedure: ANTERIOR CERVICAL DECOMPRESSION/DISCECTOMY FUSION 1 LEVEL;  Surgeon: Elaina Hoops, MD;  Location: [facility];  Service: Neurosurgery;  Laterality: N/A;  Cervical five-six anterior cervical discectomy  fusion with plating  . Cesarean section  1982, 1979  . Abdominal hysterectomy  1986  . Colonoscopy  01/16/2009    Dr Bary Castilla  . Shoulder arthroscopy with subacromial decompression, rotator cuff repair and bicep tendon repair Right 11/22/2013    Procedure: RIGHT SHOULDER ATHROSCOPY OPEN SUBSCAP REPAIR DELTA-PECTORAL ;  Surgeon: Augustin Schooling, MD;  Location: [facility];  Service: Orthopedics;  Laterality: Right;   Family History  Problem Relation Age of Onset  . Anesthesia problems Neg Hx   . Breast cancer Paternal Aunt   . Breast cancer Mother   . Migraines Mother   . Rectal cancer Father    Social History  Substance Use Topics  . Smoking status: Never Smoker   . Smokeless tobacco: Never Used  . Alcohol Use: No     Comment: occasional wine   OB History    No data available     Review of Systems  All other systems reviewed and are negative.   Allergies  Gadolinium derivatives; Sulfa antibiotics; Aripiprazole; Codeine; Oxycodone; and Penicillins  Home Medications   Prior to Admission medications   Medication Sig Start Date End Date Taking? Authorizing Provider  albuterol (PROVENTIL HFA;VENTOLIN HFA) 108 (90 BASE) MCG/ACT inhaler Inhale 2 puffs into the lungs every 6 (six) hours as needed for shortness of breath.    Yes Historical Provider, MD  amitriptyline (ELAVIL) 25 MG tablet take  2 tablets at night Patient taking differently: Take 50 mg by mouth at bedtime.  01/21/15  Yes Kathrynn Ducking, MD  fluticasone [facility]) 50 MCG/ACT nasal spray Place 1 spray into both nostrils daily as needed for allergies or rhinitis.  07/04/14  Yes Historical Provider, MD  gabapentin (NEURONTIN) 300 MG capsule Take 600 mg by mouth 3 (three) times daily.    Yes Historical Provider, MD  indomethacin (INDOCIN) 50 MG capsule Take 1 capsule (50 mg total) by mouth 3 (three) times daily with meals. Patient taking differently: Take 50 mg by mouth 3 (three) times daily as needed for mild pain or moderate  pain.  02/25/15  Yes Kathrynn Ducking, MD  levalbuterol [facility] HFA) 45 MCG/ACT inhaler Inhale 1 puff into the lungs every 4 (four) hours as needed for wheezing.   Yes Historical Provider, MD  losartan-hydrochlorothiazide (HYZAAR) 100-25 MG per tablet Take 1 tablet by mouth daily.   Yes Historical Provider, MD  ondansetron (ZOFRAN ODT) 4 MG disintegrating tablet Take 1 tablet (4 mg total) by mouth every 8 (eight) hours as needed for nausea or vomiting. 03/18/15  Yes Kathrynn Ducking, MD  tiZANidine (ZANAFLEX) 2 MG tablet Take 2 mg by mouth at bedtime as needed for muscle spasms.  03/24/14  Yes Historical Provider, MD  topiramate (TOPAMAX) 100 MG tablet TAKE 1 TABLET (100 MG TOTAL) BY MOUTH AT BEDTIME. Patient taking differently: Take 200 mg by mouth at bedtime.  10/03/14  Yes Ward Givens, NP  traMADol (ULTRAM) 50 MG tablet Take 1 tablet (50 mg total) by mouth every 8 (eight) hours as needed. 03/18/15  Yes Kathrynn Ducking, MD   BP 103/83 mmHg  Pulse 90  Temp(Src) 98.1 F (36.7 C) (Oral)  Resp 22  Ht 5\' 5"  (1.651 m)  Wt 215 lb (97.523 kg)  BMI 35.78 kg/m2  SpO2 94%   Physical Exam  Constitutional: She is oriented to person, place, and time. She appears well-developed and well-nourished.  HENT:  Head: Normocephalic and atraumatic.  Eyes: Conjunctivae are normal. Pupils are equal, round, and reactive to light. Right eye exhibits no discharge. Left eye exhibits no discharge. No scleral icterus.  Neck: Normal range of motion. No JVD present. No tracheal deviation present.  Cardiovascular: Regular rhythm and normal heart sounds.  Exam reveals no gallop and no friction rub.   No murmur heard. Pulmonary/Chest: Effort normal. No stridor.  Abdominal: Soft. She exhibits no distension and no mass. There is no tenderness. There is no rebound and no guarding.  Neurological: She is alert and oriented to person, place, and time. She has normal strength. No cranial nerve deficit or sensory deficit.  Coordination normal. GCS eye subscore is 4. GCS verbal subscore is 5. GCS motor subscore is 6.  Psychiatric: She has a normal mood and affect. Her behavior is normal. Judgment and thought content normal.  Nursing note and vitals reviewed.   ED Course  Procedures (including critical care time) Labs Review Labs Reviewed  BASIC METABOLIC PANEL - Abnormal; Notable for the following:    Potassium 3.0 (*)    Chloride 100 (*)    Glucose, Bld 115 (*)    Creatinine, Ser 1.21 (*)    Calcium 8.8 (*)    GFR calc non Af Amer 48 (*)    GFR calc Af Amer 55 (*)    All other components within normal limits  D-DIMER, QUANTITATIVE (NOT AT [facility]) - Abnormal; Notable for the following:    D-Dimer, Quant  9.54 (*)    All other components within normal limits  TROPONIN I - Abnormal; Notable for the following:    Troponin I 0.06 (*)    All other components within normal limits  MRSA PCR SCREENING  CBC  PROTIME-INR  TSH  TROPONIN I  Randolm Idol, ED    Imaging Review Dg Chest 2 View  04/19/2015   CLINICAL DATA:  Acute chest pain.  EXAM: CHEST  2 VIEW  COMPARISON:  November 22, 2013.  FINDINGS: The heart size and mediastinal contours are within normal limits. Both lungs are clear. No pneumothorax or pleural effusion is noted. The visualized skeletal structures are unremarkable.  IMPRESSION: No active cardiopulmonary disease.   Electronically Signed   By: Marijo Conception, M.D.   On: 04/19/2015 12:42   I, Jasman Murri Todd, personally reviewed and evaluated these images and lab results as part of my medical decision-making.   EKG Interpretation None      MDM   Final diagnoses:  Chest pain    Labs:  Imaging:  Consults:  Therapeutics:  Discharge Meds:   Assessment/Plan: Patient presents with numerous complaints. At the time of my evaluation patient was asymptomatic of the chest pain, only complaining of right posterior head pain, his headache is similar to previous, no red flags.  Towards the end of my evaluation patient began to experience the left anterior chest pain that she described earlier. This is unlikely to be ACS, but due to patient's onset of symptoms and description with code STEMI transmitted [facility] Pontes, cardiology was sent to evaluate. After my initial evaluation cardiology was present in the room and assumed care for further evaluation and inpatient management. Patient remained stable here in the ED.          Okey Regal, PA-C 04/19/15 1550  Pattricia Boss, MD 04/21/15 1435

## 2015-04-20 ENCOUNTER — Ambulatory Visit (HOSPITAL_COMMUNITY): Payer: Medicare Other

## 2015-04-20 ENCOUNTER — Observation Stay (HOSPITAL_COMMUNITY): Payer: Medicare Other

## 2015-04-20 ENCOUNTER — Inpatient Hospital Stay (HOSPITAL_COMMUNITY): Payer: Medicare Other

## 2015-04-20 ENCOUNTER — Other Ambulatory Visit (HOSPITAL_COMMUNITY): Payer: Medicare Other

## 2015-04-20 ENCOUNTER — Other Ambulatory Visit: Payer: Medicare Other

## 2015-04-20 ENCOUNTER — Encounter (HOSPITAL_COMMUNITY): Payer: Self-pay | Admitting: Radiology

## 2015-04-20 DIAGNOSIS — E872 Acidosis: Secondary | ICD-10-CM | POA: Diagnosis not present

## 2015-04-20 DIAGNOSIS — I5023 Acute on chronic systolic (congestive) heart failure: Secondary | ICD-10-CM | POA: Diagnosis not present

## 2015-04-20 DIAGNOSIS — R079 Chest pain, unspecified: Secondary | ICD-10-CM | POA: Diagnosis not present

## 2015-04-20 DIAGNOSIS — E669 Obesity, unspecified: Secondary | ICD-10-CM | POA: Diagnosis present

## 2015-04-20 DIAGNOSIS — N183 Chronic kidney disease, stage 3 (moderate): Secondary | ICD-10-CM | POA: Diagnosis present

## 2015-04-20 DIAGNOSIS — Z79899 Other long term (current) drug therapy: Secondary | ICD-10-CM | POA: Diagnosis not present

## 2015-04-20 DIAGNOSIS — I5021 Acute systolic (congestive) heart failure: Secondary | ICD-10-CM | POA: Diagnosis not present

## 2015-04-20 DIAGNOSIS — R0600 Dyspnea, unspecified: Secondary | ICD-10-CM

## 2015-04-20 DIAGNOSIS — R579 Shock, unspecified: Secondary | ICD-10-CM | POA: Insufficient documentation

## 2015-04-20 DIAGNOSIS — I429 Cardiomyopathy, unspecified: Secondary | ICD-10-CM | POA: Diagnosis present

## 2015-04-20 DIAGNOSIS — I214 Non-ST elevation (NSTEMI) myocardial infarction: Secondary | ICD-10-CM

## 2015-04-20 DIAGNOSIS — R570 Cardiogenic shock: Secondary | ICD-10-CM | POA: Insufficient documentation

## 2015-04-20 DIAGNOSIS — Z853 Personal history of malignant neoplasm of breast: Secondary | ICD-10-CM | POA: Diagnosis not present

## 2015-04-20 DIAGNOSIS — I447 Left bundle-branch block, unspecified: Secondary | ICD-10-CM | POA: Diagnosis not present

## 2015-04-20 DIAGNOSIS — E876 Hypokalemia: Secondary | ICD-10-CM

## 2015-04-20 DIAGNOSIS — I2699 Other pulmonary embolism without acute cor pulmonale: Secondary | ICD-10-CM | POA: Diagnosis not present

## 2015-04-20 DIAGNOSIS — J45909 Unspecified asthma, uncomplicated: Secondary | ICD-10-CM | POA: Diagnosis present

## 2015-04-20 DIAGNOSIS — R091 Pleurisy: Secondary | ICD-10-CM | POA: Diagnosis not present

## 2015-04-20 DIAGNOSIS — F419 Anxiety disorder, unspecified: Secondary | ICD-10-CM | POA: Diagnosis present

## 2015-04-20 DIAGNOSIS — Z6835 Body mass index (BMI) 35.0-35.9, adult: Secondary | ICD-10-CM | POA: Diagnosis not present

## 2015-04-20 DIAGNOSIS — R55 Syncope and collapse: Secondary | ICD-10-CM

## 2015-04-20 DIAGNOSIS — I2692 Saddle embolus of pulmonary artery without acute cor pulmonale: Secondary | ICD-10-CM

## 2015-04-20 DIAGNOSIS — I82432 Acute embolism and thrombosis of left popliteal vein: Secondary | ICD-10-CM | POA: Diagnosis present

## 2015-04-20 DIAGNOSIS — Z981 Arthrodesis status: Secondary | ICD-10-CM | POA: Diagnosis not present

## 2015-04-20 DIAGNOSIS — R071 Chest pain on breathing: Secondary | ICD-10-CM | POA: Diagnosis not present

## 2015-04-20 DIAGNOSIS — R0781 Pleurodynia: Secondary | ICD-10-CM | POA: Diagnosis not present

## 2015-04-20 DIAGNOSIS — I10 Essential (primary) hypertension: Secondary | ICD-10-CM | POA: Diagnosis not present

## 2015-04-20 DIAGNOSIS — I129 Hypertensive chronic kidney disease with stage 1 through stage 4 chronic kidney disease, or unspecified chronic kidney disease: Secondary | ICD-10-CM | POA: Diagnosis present

## 2015-04-20 DIAGNOSIS — R0602 Shortness of breath: Secondary | ICD-10-CM | POA: Diagnosis present

## 2015-04-20 DIAGNOSIS — R7989 Other specified abnormal findings of blood chemistry: Secondary | ICD-10-CM | POA: Diagnosis not present

## 2015-04-20 DIAGNOSIS — M5481 Occipital neuralgia: Secondary | ICD-10-CM | POA: Diagnosis present

## 2015-04-20 DIAGNOSIS — R51 Headache: Secondary | ICD-10-CM | POA: Diagnosis not present

## 2015-04-20 DIAGNOSIS — I82412 Acute embolism and thrombosis of left femoral vein: Secondary | ICD-10-CM | POA: Diagnosis present

## 2015-04-20 DIAGNOSIS — G43919 Migraine, unspecified, intractable, without status migrainosus: Secondary | ICD-10-CM | POA: Diagnosis present

## 2015-04-20 LAB — LIPID PANEL
Cholesterol: 140 mg/dL (ref 0–200)
HDL: 29 mg/dL — ABNORMAL LOW (ref >40)
LDL Cholesterol: 78 mg/dL (ref 0–99)
Total CHOL/HDL Ratio: 4.8 RATIO
Triglycerides: 164 mg/dL — ABNORMAL HIGH (ref <150)
VLDL: 33 mg/dL (ref 0–40)

## 2015-04-20 LAB — BASIC METABOLIC PANEL
Anion gap: 9 (ref 5–15)
BUN: 15 mg/dL (ref 6–20)
CALCIUM: 8.6 mg/dL — AB (ref 8.9–10.3)
CHLORIDE: 105 mmol/L (ref 101–111)
CO2: 24 mmol/L (ref 22–32)
CREATININE: 1.04 mg/dL — AB (ref 0.44–1.00)
GFR calc non Af Amer: 57 mL/min — ABNORMAL LOW (ref >60)
Glucose, Bld: 103 mg/dL — ABNORMAL HIGH (ref 65–99)
Potassium: 4.1 mmol/L (ref 3.5–5.1)
SODIUM: 138 mmol/L (ref 135–145)

## 2015-04-20 LAB — BLOOD GAS, ARTERIAL
Acid-base deficit: 3.8 mmol/L — ABNORMAL HIGH (ref 0.0–2.0)
BICARBONATE: 21.4 meq/L (ref 20.0–24.0)
O2 Content: 2 L/min
O2 Saturation: 97.1 %
PCO2 ART: 42.6 mmHg (ref 35.0–45.0)
PH ART: 7.319 — AB (ref 7.350–7.450)
PO2 ART: 104 mmHg — AB (ref 80.0–100.0)
Patient temperature: 97.8
TCO2: 22.8 mmol/L (ref 0–100)

## 2015-04-20 LAB — URINALYSIS, ROUTINE W REFLEX MICROSCOPIC
Bilirubin Urine: NEGATIVE
GLUCOSE, UA: NEGATIVE mg/dL
Hgb urine dipstick: NEGATIVE
Ketones, ur: NEGATIVE mg/dL
Nitrite: NEGATIVE
PH: 6 (ref 5.0–8.0)
PROTEIN: NEGATIVE mg/dL
Specific Gravity, Urine: 1.017 (ref 1.005–1.030)
Urobilinogen, UA: 0.2 mg/dL (ref 0.0–1.0)

## 2015-04-20 LAB — CBC
HEMATOCRIT: 37.9 % (ref 36.0–46.0)
Hemoglobin: 12.5 g/dL (ref 12.0–15.0)
MCH: 28.7 pg (ref 26.0–34.0)
MCHC: 33 g/dL (ref 30.0–36.0)
MCV: 86.9 fL (ref 78.0–100.0)
PLATELETS: 190 10*3/uL (ref 150–400)
RBC: 4.36 MIL/uL (ref 3.87–5.11)
RDW: 13.9 % (ref 11.5–15.5)
WBC: 8.6 10*3/uL (ref 4.0–10.5)

## 2015-04-20 LAB — COMPREHENSIVE METABOLIC PANEL
ALT: 13 U/L — ABNORMAL LOW (ref 14–54)
AST: 21 U/L (ref 15–41)
Albumin: 2.9 g/dL — ABNORMAL LOW (ref 3.5–5.0)
Alkaline Phosphatase: 73 U/L (ref 38–126)
Anion gap: 10 (ref 5–15)
BUN: 16 mg/dL (ref 6–20)
CO2: 27 mmol/L (ref 22–32)
Calcium: 8.7 mg/dL — ABNORMAL LOW (ref 8.9–10.3)
Chloride: 102 mmol/L (ref 101–111)
Creatinine, Ser: 1.21 mg/dL — ABNORMAL HIGH (ref 0.44–1.00)
GFR calc Af Amer: 55 mL/min — ABNORMAL LOW (ref >60)
GFR calc non Af Amer: 48 mL/min — ABNORMAL LOW (ref >60)
Glucose, Bld: 117 mg/dL — ABNORMAL HIGH (ref 65–99)
Potassium: 2.9 mmol/L — ABNORMAL LOW (ref 3.5–5.1)
Sodium: 139 mmol/L (ref 135–145)
Total Bilirubin: 0.3 mg/dL (ref 0.3–1.2)
Total Protein: 6 g/dL — ABNORMAL LOW (ref 6.5–8.1)

## 2015-04-20 LAB — BRAIN NATRIURETIC PEPTIDE: B Natriuretic Peptide: 459.6 pg/mL — ABNORMAL HIGH (ref 0.0–100.0)

## 2015-04-20 LAB — TROPONIN I: Troponin I: 0.03 ng/mL (ref <0.031)

## 2015-04-20 LAB — HEPATIC FUNCTION PANEL
ALT: 13 U/L — AB (ref 14–54)
AST: 23 U/L (ref 15–41)
Albumin: 3.1 g/dL — ABNORMAL LOW (ref 3.5–5.0)
Alkaline Phosphatase: 79 U/L (ref 38–126)
BILIRUBIN DIRECT: 0.2 mg/dL (ref 0.1–0.5)
BILIRUBIN INDIRECT: 0.5 mg/dL (ref 0.3–0.9)
BILIRUBIN TOTAL: 0.7 mg/dL (ref 0.3–1.2)
Total Protein: 6.5 g/dL (ref 6.5–8.1)

## 2015-04-20 LAB — LACTIC ACID, PLASMA: LACTIC ACID, VENOUS: 2.1 mmol/L — AB (ref 0.5–2.0)

## 2015-04-20 LAB — HEPARIN LEVEL (UNFRACTIONATED): HEPARIN UNFRACTIONATED: 0.95 [IU]/mL — AB (ref 0.30–0.70)

## 2015-04-20 LAB — URINE MICROSCOPIC-ADD ON

## 2015-04-20 MED ORDER — POTASSIUM CHLORIDE CRYS ER 20 MEQ PO TBCR
40.0000 meq | EXTENDED_RELEASE_TABLET | Freq: Once | ORAL | Status: AC
  Administered 2015-04-20: 40 meq via ORAL
  Filled 2015-04-20: qty 2

## 2015-04-20 MED ORDER — IOHEXOL 350 MG/ML SOLN
100.0000 mL | Freq: Once | INTRAVENOUS | Status: AC | PRN
  Administered 2015-04-20: 100 mL via INTRAVENOUS

## 2015-04-20 MED ORDER — POTASSIUM CHLORIDE CRYS ER 20 MEQ PO TBCR
40.0000 meq | EXTENDED_RELEASE_TABLET | Freq: Once | ORAL | Status: AC
  Administered 2015-04-20: 40 meq via ORAL

## 2015-04-20 MED ORDER — HEPARIN (PORCINE) IN NACL 100-0.45 UNIT/ML-% IJ SOLN
1300.0000 [IU]/h | INTRAMUSCULAR | Status: DC
Start: 2015-04-20 — End: 2015-04-21
  Administered 2015-04-20: 1500 [IU]/h via INTRAVENOUS
  Administered 2015-04-21: 1300 [IU]/h via INTRAVENOUS
  Filled 2015-04-20 (×2): qty 250

## 2015-04-20 MED ORDER — SODIUM CHLORIDE 0.9 % IV SOLN
INTRAVENOUS | Status: DC
  Administered 2015-04-20 – 2015-04-21 (×3): via INTRAVENOUS

## 2015-04-20 MED ORDER — POTASSIUM CHLORIDE CRYS ER 10 MEQ PO TBCR
EXTENDED_RELEASE_TABLET | ORAL | Status: AC
  Filled 2015-04-20: qty 4

## 2015-04-20 MED ORDER — SODIUM CHLORIDE 0.9 % IV BOLUS (SEPSIS)
500.0000 mL | Freq: Once | INTRAVENOUS | Status: AC
  Administered 2015-04-20: 500 mL via INTRAVENOUS

## 2015-04-20 MED ORDER — HEPARIN (PORCINE) IN NACL 100-0.45 UNIT/ML-% IJ SOLN
INTRAMUSCULAR | Status: AC
  Filled 2015-04-20: qty 250
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
## 2015-04-20 NOTE — Progress Notes (Signed)
Pt back from CT, in bed. NAD VSS

## 2015-04-20 NOTE — Progress Notes (Signed)
Bedside EEG completed, results pending. 

## 2015-04-20 NOTE — Progress Notes (Signed)
Per CCMD call with symptomatic hypotension. MD aware art line pressures running 70s/40s. Pt AAOx3 NAD. Pt getting EEG now.

## 2015-04-20 NOTE — Procedures (Signed)
ELECTROENCEPHALOGRAM REPORT   Patient: Patricia Fischer     Room #: 2H-22 Age: 59 y.o.        Sex: female Referring Physician: Dr Tamala Julian Report Date:  04/20/2015        Interpreting Physician: Hulen Luster  History: Patricia Fischer is an 59 y.o. female admitted with recurrent episodes of syncope  Medications:  Scheduled: . amitriptyline  50 mg Oral QHS  . aspirin EC  81 mg Oral Daily  . atorvastatin  40 mg Oral q1800  . gabapentin  600 mg Oral TID  . hydrochlorothiazide  25 mg Oral Daily  . losartan  100 mg Oral Daily  . metoprolol tartrate  12.5 mg Oral BID  . pantoprazole  40 mg Oral Q0600  . potassium chloride  20 mEq Oral Daily  . sodium chloride  3 mL Intravenous Q12H  . topiramate  150 mg Oral QHS    Conditions of Recording:  This is a 16 channel EEG carried out with the patient in the awake state.  Description:  The waking background activity consists of a low voltage, symmetrical, fairly well organized, 5 to 7 Hz alpha activity. Low voltage fast activity, poorly organized, is seen anteriorly and is at times superimposed on more posterior regions.  No focal slowing or epileptiform activity.  Hyperventilation was not performed. Intermittent photic stimulation was not performed.    IMPRESSION: This is an abnormal EEG secondary to general background slowing. This finding may be seen with a diffuse disturbance that is etiologically nonspecific. No epileptiform activity was noted.     Jim Like, DO [facility] [phone]  If 7pm- 7am, please page neurology on call as listed in [place]. 04/20/2015, 4:09 PM

## 2015-04-20 NOTE — Progress Notes (Signed)
*  Preliminary Results* Bilateral lower extremity venous duplex completed. The right lower extremity is negative for deep vein thrombosis. The left lower extremity is positive for acute occlusive deep vein thrombosis involving the left distal femoral vein and left popliteal vein, and nonocclusive acute deep vein thrombosis involving the left posterior tibial and peroneal veins. There is no evidence of Baker's cyst bilaterally.  Preliminary results discussed with Ander Purpura, RN and Dr. Chase Caller.  04/20/2015  Maudry Mayhew, RVT, RDCS, RDMS

## 2015-04-20 NOTE — Progress Notes (Signed)
Called to evaluate patient for hypotension and consideration of lytic therapy.  Reviewed CT with radiologist.  Patient's SBP reading in the 40's.  HR is 70 but patient is on Coreg.  Discussed with pharmacy.  Reversal is possible with glucagon at 0.05 mg/kg dose.  On physical exam however, the patient is mildly lethargic but awake, eating and conversing.  I can palpate a faint radial pulse.  Therefore, before starting lytics, will place an a-line first.  If BP is truly low then will begin glucagon.  If ineffective to increase HR then will either lyse or place TLC for levophed (likely levophed as patient is not hypoxemic making the cause of hypotension unlikely to be the PE).    Placed a-line, SBP is in the 80's, will hold coreg.  No lytics.  Called back, patient's BP now in the 70.  Will check ABG if hypoxemic then will lyse.  CC time 45 minutes.  Rush Farmer, M.D. [facility] Pulmonary/Critical Care Medicine. Pager: [phone]. After hours pager: [phone].

## 2015-04-20 NOTE — Procedures (Signed)
Arterial Catheter Insertion Procedure Note AVEYA BEAL [id number] [birth year]  Procedure: Insertion of Arterial Catheter  Indications: Blood pressure monitoring and Frequent blood sampling  Procedure Details Consent: Risks of procedure as well as the alternatives and risks of each were explained to the (patient/caregiver).  Consent for procedure obtained. Time Out: Verified patient identification, verified procedure, site/side was marked, verified correct patient position, special equipment/implants available, medications/allergies/relevent history reviewed, required imaging and test results available.  Performed  Maximum sterile technique was used including antiseptics, cap, gloves, gown, hand hygiene, mask and sheet. Skin prep: Chlorhexidine; local anesthetic administered 20 gauge catheter was inserted into right radial artery using the Seldinger technique.  Evaluation Blood flow good; BP tracing good. Complications: No apparent complications.   Jennet Maduro 04/20/2015

## 2015-04-20 NOTE — Progress Notes (Signed)
Neurology at bedside at this now

## 2015-04-20 NOTE — Progress Notes (Signed)
  Echocardiogram 2D Echocardiogram has been performed.  Patricia Fischer 04/20/2015, 12:27 PM

## 2015-04-20 NOTE — Progress Notes (Signed)
Pt down to CT, RN to travel with her. NAD VSS.

## 2015-04-20 NOTE — Progress Notes (Signed)
ANTICOAGULATION CONSULT NOTE - Initial Consult  Pharmacy Consult for heparin Indication: chest pain/ACS and pulmonary embolus  Allergies  Allergen Reactions  . Gadolinium Derivatives Shortness Of Breath and Nausea And Vomiting  . Sulfa Antibiotics Swelling    "Ears swelled up like Dumbo"  . Aripiprazole     Dry mouth with sores   . Codeine Itching and Rash  . Oxycodone Itching  . Penicillins Hives    Patient Measurements: Height: 5\' 5"  (165.1 cm) Weight: 215 lb (97.523 kg) IBW/kg (Calculated) : 57 Heparin Dosing Weight: 89.2 kg  Vital Signs: Temp: 97.3 F (36.3 C) (08/15 0752) Temp Source: Oral (08/15 0752) BP: 113/65 mmHg (08/15 0752) Pulse Rate: 75 (08/15 0752)  Labs:  Recent Labs  04/19/15 1141 04/19/15 1210 04/19/15 1952 04/20/15 0244  HGB 12.8  --   --   --   HCT 38.3  --   --   --   PLT 223  --   --   --   LABPROT  --  14.4  --   --   INR  --  1.10  --   --   CREATININE 1.21*  --   --  1.21*  TROPONINI  --  0.06* 0.08*  --     Estimated Creatinine Clearance: 57.1 mL/min (by C-G formula based on Cr of 1.21).   Medical History: Past Medical History  Diagnosis Date  . Essential hypertension   . Asthma   . Environmental allergies   . Pneumonia 2012  . Blood transfusion [place]  . GERD (gastroesophageal reflux disease)   . Depression   . Breast cancer, left breast 2011    DCIS  . Head injury 2011  . Anxiety   . Hemorrhoids   . Obese   . Chronic migraine without aura, with intractable migraine, so stated, with status migrainosus March 2016    Medications:  Prescriptions prior to admission  Medication Sig Dispense Refill Last Dose  . albuterol (PROVENTIL HFA;VENTOLIN HFA) 108 (90 BASE) MCG/ACT inhaler Inhale 2 puffs into the lungs every 6 (six) hours as needed for shortness of breath.    over 30 days  . amitriptyline (ELAVIL) 25 MG tablet take 2 tablets at night (Patient taking differently: Take 50 mg by mouth at bedtime. ) 180  tablet 3 04/18/2015 at Unknown time  . fluticasone (FLONASE) 50 MCG/ACT nasal spray Place 1 spray into both nostrils daily as needed for allergies or rhinitis.   11 over 30 days  . gabapentin (NEURONTIN) 300 MG capsule Take 600 mg by mouth 3 (three) times daily.    04/18/2015 at Unknown time  . indomethacin (INDOCIN) 50 MG capsule Take 1 capsule (50 mg total) by mouth 3 (three) times daily with meals. (Patient taking differently: Take 50 mg by mouth 3 (three) times daily as needed for mild pain or moderate pain. ) 90 capsule 1 over 30 days  . levalbuterol (XOPENEX HFA) 45 MCG/ACT inhaler Inhale 1 puff into the lungs every 4 (four) hours as needed for wheezing.   over 30 days  . losartan-hydrochlorothiazide (HYZAAR) 100-25 MG per tablet Take 1 tablet by mouth daily.   04/18/2015 at Unknown time  . ondansetron (ZOFRAN ODT) 4 MG disintegrating tablet Take 1 tablet (4 mg total) by mouth every 8 (eight) hours as needed for nausea or vomiting. 30 tablet 2 over 30 days  . tiZANidine (ZANAFLEX) 2 MG tablet Take 2 mg by mouth at bedtime as needed for muscle spasms.  over 30 days  . topiramate (TOPAMAX) 100 MG tablet TAKE 1 TABLET (100 MG TOTAL) BY MOUTH AT BEDTIME. (Patient taking differently: Take 200 mg by mouth at bedtime. ) 90 tablet 3 04/18/2015 at Unknown time  . traMADol (ULTRAM) 50 MG tablet Take 1 tablet (50 mg total) by mouth every 8 (eight) hours as needed. 60 tablet 1 04/18/2015 at Unknown time    Assessment: 59 yo woman admitted 04/19/2015 for r/o PE vs ACS, complaining of dizziness, diaphoresis, & SOB. Elevated D-dimer to 9.54, trop 0.08. CT Chest scheduled for today, 8/15. Pharmacy consulted to dose heparin.  PMH HTN, asthma, GERD, depression, breast CA s/p lumpectomy, anxiety, chronic migraines  CBC wnl, previously on heparin ppx and received 5,000 units heparin @ 0511 this AM. No oral anticoagulants prior to admission. Will not give bolus when starting heparin gtt.   Goal of Therapy:  Heparin  level 0.3-0.7 units/ml Monitor platelets by anticoagulation protocol: Yes   Plan:  Start heparin 1500 units/h HL in 8h Daily HL and CBC Monitor s/sx bleeding F/u CT Chest results   Heloise Ochoa, [place].D. PGY2 Cardiology Pharmacy Resident Pager: [phone] 04/20/2015,9:05 AM

## 2015-04-20 NOTE — Progress Notes (Signed)
Pt + for PE, Dr Chase Caller at bedside now.

## 2015-04-20 NOTE — Progress Notes (Signed)
Pt with + PE. Saddle embolus with rt heart strain.   She has been started on heparin.

## 2015-04-20 NOTE — Progress Notes (Signed)
Notified Iv team for 20 gauge iv.  2 rn unable to obtain.  [name] T

## 2015-04-20 NOTE — Consult Note (Addendum)
NEURO HOSPITALIST CONSULT NOTE   Referring physician: Tamala Julian   Reason for Consult: syncope  HPI:                                                                                                                                          Patricia Fischer is an 59 y.o. female who is followed closely by Dr. Westley Gambles for HA and frequent syncopal episodes. She has been receiving multiple steroid injections for her intractable migraine. His last note he states he would like to have a EEG and prolonged Holter monitor.  HE also would like to have second opinion at [facility].  Patietn awoke on Sunday and did not feel well. She became very diaphoretic and weak after her shower. She went to Sunday school where she had a episode of sudden onset weakness and diaphoresis but no syncope. While in the hospital she had a CTA chest showing PE with right heart strain. Currently her main complaint is her sever HA that is located behind her right ear and migrates up her scalp.   Past Medical History  Diagnosis Date  . Essential hypertension   . Asthma   . Environmental allergies   . Pneumonia 2012  . Blood transfusion [place]  . GERD (gastroesophageal reflux disease)   . Depression   . Breast cancer, left breast 2011    DCIS  . Head injury 2011  . Anxiety   . Hemorrhoids   . Obese   . Chronic migraine without aura, with intractable migraine, so stated, with status migrainosus March 2016    Past Surgical History  Procedure Laterality Date  . Breast surgery Left 2011    Breast lumpectomy  . Cardiac catheterization  2010    [facility]; pt states it was "clear"  . Tonsillectomy    . Appendectomy    . Cholecystectomy    . Tubal ligation    . Anal fissure repair    . Knee cartilage surgery Left   . Carpal tunnel release Bilateral   . Tarsal tunnel release Left 1990  . Anterior cervical decomp/discectomy fusion  01/23/2012    Procedure: ANTERIOR CERVICAL  DECOMPRESSION/DISCECTOMY FUSION 1 LEVEL;  Surgeon: Elaina Hoops, MD;  Location: [place] [facility];  Service: Neurosurgery;  Laterality: N/A;  Cervical five-six anterior cervical discectomy fusion with plating  . Cesarean section  1982, 1979  . Abdominal hysterectomy  1986  . Colonoscopy  01/16/2009    Dr Bary Castilla  . Shoulder arthroscopy with subacromial decompression, rotator cuff repair and bicep tendon repair Right 11/22/2013    Procedure: RIGHT SHOULDER ATHROSCOPY OPEN SUBSCAP REPAIR DELTA-PECTORAL ;  Surgeon: Augustin Schooling, MD;  Location: [place];  Service: Orthopedics;  Laterality: Right;    Family History  Problem Relation Age of Onset  . Anesthesia problems Neg Hx   . Breast cancer Paternal Aunt   . Breast cancer Mother   . Migraines Mother   . Rectal cancer Father     Social History:  reports that she has never smoked. She has never used smokeless tobacco. She reports that she does not drink alcohol or use illicit drugs.  Allergies  Allergen Reactions  . Gadolinium Derivatives Shortness Of Breath and Nausea And Vomiting  . Sulfa Antibiotics Swelling    "Ears swelled up like Dumbo"  . Aripiprazole     Dry mouth with sores   . Codeine Itching and Rash  . Oxycodone Itching  . Penicillins Hives    MEDICATIONS:                                                                                                                     Prior to Admission:  Prescriptions prior to admission  Medication Sig Dispense Refill Last Dose  . albuterol (PROVENTIL HFA;VENTOLIN HFA) 108 (90 BASE) MCG/ACT inhaler Inhale 2 puffs into the lungs every 6 (six) hours as needed for shortness of breath.    over 30 days  . amitriptyline (ELAVIL) 25 MG tablet take 2 tablets at night (Patient taking differently: Take 50 mg by mouth at bedtime. ) 180 tablet 3 04/18/2015 at Unknown time  . fluticasone (FLONASE) 50 MCG/ACT nasal spray Place 1 spray into both nostrils daily as needed for allergies or rhinitis.   11 over  30 days  . gabapentin (NEURONTIN) 300 MG capsule Take 600 mg by mouth 3 (three) times daily.    04/18/2015 at Unknown time  . indomethacin (INDOCIN) 50 MG capsule Take 1 capsule (50 mg total) by mouth 3 (three) times daily with meals. (Patient taking differently: Take 50 mg by mouth 3 (three) times daily as needed for mild pain or moderate pain. ) 90 capsule 1 over 30 days  . levalbuterol (XOPENEX HFA) 45 MCG/ACT inhaler Inhale 1 puff into the lungs every 4 (four) hours as needed for wheezing.   over 30 days  . losartan-hydrochlorothiazide (HYZAAR) 100-25 MG per tablet Take 1 tablet by mouth daily.   04/18/2015 at Unknown time  . ondansetron (ZOFRAN ODT) 4 MG disintegrating tablet Take 1 tablet (4 mg total) by mouth every 8 (eight) hours as needed for nausea or vomiting. 30 tablet 2 over 30 days  . tiZANidine (ZANAFLEX) 2 MG tablet Take 2 mg by mouth at bedtime as needed for muscle spasms.    over 30 days  . topiramate (TOPAMAX) 100 MG tablet TAKE 1 TABLET (100 MG TOTAL) BY MOUTH AT BEDTIME. (Patient taking differently: Take 200 mg by mouth at bedtime. ) 90 tablet 3 04/18/2015 at Unknown time  . traMADol (ULTRAM) 50 MG tablet Take 1 tablet (50 mg total) by mouth every 8 (eight) hours as needed. 60 tablet 1 04/18/2015 at Unknown time   Scheduled: . amitriptyline  50 mg Oral QHS  . aspirin EC  81 mg Oral Daily  . atorvastatin  40 mg Oral q1800  . gabapentin  600 mg Oral TID  . hydrochlorothiazide  25 mg Oral Daily  . losartan  100 mg Oral Daily  . metoprolol tartrate  12.5 mg Oral BID  . pantoprazole  40 mg Oral Q0600  . potassium chloride  20 mEq Oral Daily  . sodium chloride  3 mL Intravenous Q12H  . topiramate  150 mg Oral QHS     ROS:                                                                                                                                       History obtained from the patient  General ROS: negative for - chills, fatigue, fever, night sweats, weight gain or weight  loss Psychological ROS: negative for - behavioral disorder, hallucinations, memory difficulties, mood swings or suicidal ideation Ophthalmic ROS: negative for - blurry vision, double vision, eye pain or loss of vision ENT ROS: negative for - epistaxis, nasal discharge, oral lesions, sore throat, tinnitus or vertigo Allergy and Immunology ROS: negative for - hives or itchy/watery eyes Hematological and Lymphatic ROS: negative for - bleeding problems, bruising or swollen lymph nodes Endocrine ROS: negative for - galactorrhea, hair pattern changes, polydipsia/polyuria or temperature intolerance Respiratory ROS: negative for - cough, hemoptysis, shortness of breath or wheezing Cardiovascular ROS: negative for - chest pain, dyspnea on exertion, edema or irregular heartbeat Gastrointestinal ROS: negative for - abdominal pain, diarrhea, hematemesis, nausea/vomiting or stool incontinence Genito-Urinary ROS: negative for - dysuria, hematuria, incontinence or urinary frequency/urgency Musculoskeletal ROS: negative for - joint swelling or muscular weakness Neurological ROS: as noted in HPI Dermatological ROS: negative for rash and skin lesion changes   Blood pressure 69/51, pulse 79, temperature 97.5 F (36.4 C), temperature source Oral, resp. rate 18, height 5\' 5"  (1.651 m), weight 97.523 kg (215 lb), SpO2 100 %.   Neurologic Examination:                                                                                                      HEENT-  Normocephalic, no lesions, without obvious abnormality.  Normal external eye and conjunctiva.  Normal TM's bilaterally.  Normal auditory canals and external ears. Normal external nose, mucus membranes and septum.  Normal pharynx. Cardiovascular- S1, S2 normal, pulses palpable throughout   Lungs- chest clear, no wheezing, rales, normal symmetric air entry Abdomen- normal findings: bowel sounds normal Extremities- no edema Lymph-no adenopathy  palpable Musculoskeletal-no joint tenderness, deformity or swelling Skin-warm and dry, no hyperpigmentation, vitiligo, or suspicious lesions  Neurological Examination Mental Status: Alert, oriented, thought content appropriate.  Speech fluent without evidence of aphasia.  Able to follow 3 step commands without difficulty. Cranial Nerves: II: Discs flat bilaterally; Visual fields grossly normal, pupils equal, round, reactive to light and accommodation III,IV, VI: ptosis not present, extra-ocular motions intact bilaterally V,VII: smile symmetric, facial light touch sensation normal bilaterally VIII: hearing normal bilaterally IX,X: uvula rises symmetrically XI: bilateral shoulder shrug XII: midline tongue extension Motor: Right : Upper extremity   5/5 ROM limited by RTC surgery  Left:     Upper extremity   5/5  Lower extremity   5/5       Lower extremity   5/5 Tone and bulk:normal tone throughout; no atrophy noted Sensory: Pinprick and light touch intact throughout, bilaterally Deep Tendon Reflexes: 1+ and symmetric throughout and no AJ Plantars: Mute bilaterally Cerebellar: normal finger-to-nose, normal rapid alternating movements and normal heel-to-shin test Gait: normal gait and station      Lab Results: Basic Metabolic Panel:  Recent Labs Lab 04/19/15 1141 04/20/15 0244 04/20/15 1109  NA 137 139 138  K 3.0* 2.9* 4.1  CL 100* 102 105  CO2 27 27 24   GLUCOSE 115* 117* 103*  BUN 18 16 15   CREATININE 1.21* 1.21* 1.04*  CALCIUM 8.8* 8.7* 8.6*    Liver Function Tests:  Recent Labs Lab 04/20/15 0244 04/20/15 1153  AST 21 23  ALT 13* 13*  ALKPHOS 73 79  BILITOT 0.3 0.7  PROT 6.0* 6.5  ALBUMIN 2.9* 3.1*   No results for input(s): LIPASE, AMYLASE in the last 168 hours. No results for input(s): AMMONIA in the last 168 hours.  CBC:  Recent Labs Lab 04/19/15 1141 04/20/15 1109  WBC 8.6 8.6  HGB 12.8 12.5  HCT 38.3 37.9  MCV 85.5 86.9  PLT 223 190     Cardiac Enzymes:  Recent Labs Lab 04/19/15 1210 04/19/15 1952 04/20/15 1153  TROPONINI 0.06* 0.08* 0.03    Lipid Panel:  Recent Labs Lab 04/20/15 0244  CHOL 140  TRIG 164*  HDL 29*  CHOLHDL 4.8  VLDL 33  LDLCALC 78    CBG: No results for input(s): GLUCAP in the last 168 hours.  Microbiology: Results for orders placed or performed during the hospital encounter of 04/19/15  MRSA PCR Screening     Status: None   Collection Time: 04/19/15  3:35 PM  Result Value Ref Range Status   MRSA by PCR NEGATIVE NEGATIVE Final    Comment:        The GeneXpert MRSA Assay (FDA approved for NASAL specimens only), is one component of a comprehensive MRSA colonization surveillance program. It is not intended to diagnose MRSA infection nor to guide or monitor treatment for MRSA infections.     Coagulation Studies:  Recent Labs  04/19/15 1210  LABPROT 14.4  INR 1.10    Imaging: Dg Chest 2 View  04/19/2015   CLINICAL DATA:  Acute chest pain.  EXAM: CHEST  2 VIEW  COMPARISON:  November 22, 2013.  FINDINGS: The heart size and mediastinal contours are within normal limits. Both lungs are clear. No pneumothorax or pleural effusion is noted. The visualized skeletal structures are unremarkable.  IMPRESSION: No active cardiopulmonary disease.   Electronically Signed   By: Marijo Conception, M.D.   On: 04/19/2015 12:42   Ct Head Wo Contrast  04/20/2015   CLINICAL  DATA:  Headaches  EXAM: CT HEAD WITHOUT CONTRAST  TECHNIQUE: Contiguous axial images were obtained from the base of the skull through the vertex without intravenous contrast.  COMPARISON:  None.  FINDINGS: The bony calvarium is intact. No gross soft tissue abnormality is noted. No findings to suggest acute hemorrhage, acute infarction or space-occupying mass lesion are noted.  IMPRESSION: No acute abnormality seen.   Electronically Signed   By: Inez Catalina M.D.   On: 04/20/2015 13:25   Ct Angio Chest Pe W/cm &/or Wo  Cm  04/20/2015   CLINICAL DATA:  Shortness of breath and chest pain  EXAM: CT ANGIOGRAPHY CHEST WITH CONTRAST  TECHNIQUE: Multidetector CT imaging of the chest was performed using the standard protocol during bolus administration of intravenous contrast. Multiplanar CT image reconstructions and MIPs were obtained to evaluate the vascular anatomy.  CONTRAST:  1110mL OMNIPAQUE IOHEXOL 350 MG/ML SOLN  COMPARISON:  Chest radiograph April 19, 2015  FINDINGS: There is saddle type pulmonary embolus extending through both main pulmonary arteries with extension into multiple peripheral branches bilaterally. There is a degree of pulmonary embolus in most peripheral lobes in segments bilaterally. There is right heart strain with right ventricular to left ventricular diameter ratio of 1.1.  There is no appreciable thoracic aortic aneurysm. Great vessels appear normal.  There is patchy atelectasis in both lower lobes, slightly more on the left than on the right and in the posterior segment right upper lobe. There is no frank edema or consolidation.  Thyroid appears normal. There is no appreciable thoracic adenopathy. The pericardium is not thickened.  Visualized upper abdominal structures are normal except for surgical absence of the gallbladder.  There is degenerative change in the thoracic spine. There are no blastic or lytic bone lesions.  Review of the MIP images confirms the above findings.  IMPRESSION: Positive for acute PE with CT evidence of right heart strain (RV/LV Ratio = 1.1) consistent with at least submassive (intermediate risk) PE. The presence of right heart strain has been associated with an increased risk of morbidity and mortality. Please activate Code PE by paging [phone].  Areas of patchy atelectasis. No lung edema or consolidation. No adenopathy. Gallbladder absent.  Critical Value/emergent results were called by telephone at the time of interpretation on 04/20/2015 at 10:30 am to Cecilie Kicks, NP ,  who verbally acknowledged these results.   Electronically Signed   By: Lowella Grip III M.D.   On: 04/20/2015 10:31       Assessment and plan per attending neurologist  Etta Quill PA-C [facility] [phone]  04/20/2015, 1:37 PM   Assessment/Plan:  59y/o woman followed by Dr Jannifer Franklin for HA and frequent syncopal episodes. Her headaches have been refractory to multiple treatments, including nerve blocks. An EEG and Holter monitor was recommended for syncope workup but this has not been completed yet. Currently admitted with PE and right heart strain. Neurology consulted for syncope workup. CT head imaging reviewed and overall unremarkable. Exam overall unremarkable. Of note her BP has been markedly low though unclear if a true reading. CCM called to place A-line.  Underlying PE likely contributing to current presenting symptoms of dizziness.  -EEG and continued cardiac monitoring for further evaluation of recurrent syncopal events -appreciate CCM for A-line placement and BP evaluation -Headache description appears consistent with possible occipital neuralgia. Agree that she would benefit from outpatient neurology referral to headache center as she has been refractory to multiple treatment options -will follow up pending EEG  Jim Like, DO [facility] [phone]  If 7pm- 7am, please page neurology on call as listed in [place].

## 2015-04-20 NOTE — Progress Notes (Signed)
Dr Maryclare Bean bedside inserting Aline now for more accurate BP. NAD, pt AA&O.

## 2015-04-20 NOTE — Progress Notes (Signed)
ANTICOAGULATION CONSULT NOTE  Pharmacy Consult for heparin Indication: chest pain/ACS and pulmonary embolus  Allergies  Allergen Reactions  . Gadolinium Derivatives Shortness Of Breath and Nausea And Vomiting  . Sulfa Antibiotics Swelling    "Ears swelled up like Dumbo"  . Aripiprazole     Dry mouth with sores   . Codeine Itching and Rash  . Oxycodone Itching  . Penicillins Hives    Patient Measurements: Height: 5\' 5"  (165.1 cm) Weight: 215 lb (97.523 kg) IBW/kg (Calculated) : 57 Heparin Dosing Weight: 89.2 kg  Vital Signs: Temp: 97.6 F (36.4 C) (08/15 1620) Temp Source: Oral (08/15 1620) BP: 77/43 mmHg (08/15 1620) Pulse Rate: 60 (08/15 1620)  Labs:  Recent Labs  04/19/15 1141 04/19/15 1210 04/19/15 1952 04/20/15 0244 04/20/15 1109 04/20/15 1153 04/20/15 1720  HGB 12.8  --   --   --  12.5  --   --   HCT 38.3  --   --   --  37.9  --   --   PLT 223  --   --   --  190  --   --   LABPROT  --  14.4  --   --   --   --   --   INR  --  1.10  --   --   --   --   --   HEPARINUNFRC  --   --   --   --   --   --  0.95*  CREATININE 1.21*  --   --  1.21* 1.04*  --   --   TROPONINI  --  0.06* 0.08*  --   --  0.03  --     Estimated Creatinine Clearance: 66.5 mL/min (by C-G formula based on Cr of 1.04).   Assessment: 59 yo woman admitted 04/19/2015 for r/o PE vs ACS, complaining of dizziness, diaphoresis, & SOB. Elevated D-dimer to 9.54, trop 0.08. CT Chest done today shows "saddle type pulmonary embolus extending through both main pulmonary arteries with extension into multiple peripheral branches bilaterally. There is a degree of pulmonary embolus in most peripheral lobes in segments bilaterally. There is right heart strain with right ventricular to left ventricular diameter ratio of 1.1"  CBC wnl, previously on heparin ppx and received 5,000 units heparin @ 0511 this AM. No oral anticoagulants prior to admission. Did not get bolus when starting heparin gtt.   HL this  evening elevated at 0.95 units/mL. Noted Aline was placed for more accurate detection of blood pressure. She is undergoing syncope workup as well.  No orders for lytic therapy at this time.  Goal of Therapy:  Heparin level 0.3-0.7 units/ml Monitor platelets by anticoagulation protocol: Yes   Plan:  -reduce heparin to 1300 units/h -HL in 6h at 0100 -Daily HL and CBC -Monitor s/sx bleeding -f/u start of oral anticoagulation and/or lytic therapy  Mykel Sponaugle D. Reyana Leisey, PharmD, BCPS Clinical Pharmacist Pager: [phone] 04/20/2015 6:38 PM

## 2015-04-20 NOTE — Progress Notes (Signed)
While assisting pt to BR pt had dizziness and was upset. Pt woke up unsure of where she was and where her boys were.  Pt tried to void and was unable and got very emotional upset.  Assisted pt back to bed and assessed pt DOB and alertness. Pt knew month, day and slow to get year.  In the next sentence pt stated : who's going to do my procedure tomorrow."  "  I hope they don't use a large needle. "  " Do you know what time its going to be. "  Obtained bladder scan due to pt feeling unable to void.  Bladder scan showed 50-120 cc.  Pt did void 150 in BR before.  Pupils reactive.  Neuro intact.  Will continue to monitor. [name] T

## 2015-04-20 NOTE — Progress Notes (Signed)
RN called, pt confused at times, other times has difficulty voiding.  Also K+ 2.9, reviewed labs DDimer elevated at 9 will get stat ctA to rule out PE.  Will check u/a.  Confusion could be with UTI.

## 2015-04-20 NOTE — Progress Notes (Signed)
Pt back from CT, sitting up eating, blood pressure 69/41, comparable in both arms. Dr Chase Caller notified. Lactate 2.1, 556mL bolus ordered.CCMD coming to bedside.

## 2015-04-20 NOTE — Progress Notes (Signed)
Made Md aware of pt's potassium of 2.9 and periods of confusion.  Md aware of pt's unable to void.  Will send UA and obtain CT.  Gave 40 meq of potassium. Will continue to monitor. [name] T

## 2015-04-20 NOTE — Progress Notes (Addendum)
       Patient Name: Patricia Fischer Date of Encounter: 04/20/2015    SUBJECTIVE: Dyspnea and dizziness. Also some chest pain. Has mildly elevated troponin  TELEMETRY:  Sinus tach Filed Vitals:   04/19/15 2318 04/20/15 0003 04/20/15 0327 04/20/15 0752  BP: 94/73 98/70 101/65 113/65  Pulse:    75  Temp: 97.5 F (36.4 C)  97.7 F (36.5 C) 97.3 F (36.3 C)  TempSrc: Oral  Oral Oral  Resp: 18   20  Height:      Weight:      SpO2: 100% 100% 99% 98%    Intake/Output Summary (Last 24 hours) at 04/20/15 0814 Last data filed at 04/20/15 0700  Gross per 24 hour  Intake    600 ml  Output    750 ml  Net   -150 ml   LABS: Basic Metabolic Panel:  Recent Labs  04/19/15 1141 04/20/15 0244  NA 137 139  K 3.0* 2.9*  CL 100* 102  CO2 27 27  GLUCOSE 115* 117*  BUN 18 16  CREATININE 1.21* 1.21*  CALCIUM 8.8* 8.7*   CBC:  Recent Labs  04/19/15 1141  WBC 8.6  HGB 12.8  HCT 38.3  MCV 85.5  PLT 223   Cardiac Enzymes:  Recent Labs  04/19/15 1210 04/19/15 1952  TROPONINI 0.06* 0.08*   BNP No results found for: BNP  ProBNP No results found for: PROBNP   Fasting Lipid Panel:  Recent Labs  04/20/15 0244  CHOL 140  HDL 29*  LDLCALC 78  TRIG 164*  CHOLHDL 4.8    Radiology/Studies:  CXR with NAD on yesterday  Physical Exam: Blood pressure 113/65, pulse 75, temperature 97.3 F (36.3 C), temperature source Oral, resp. rate 20, height 5\' 5"  (1.651 m), weight 97.523 kg (215 lb), SpO2 98 %. Weight change:   Wt Readings from Last 3 Encounters:  04/19/15 97.523 kg (215 lb)  11/04/14 105.597 kg (232 lb 12.8 oz)  10/03/14 105.235 kg (232 lb)   Elevated neck veins Chest is clear Cardiac with possible intermittent gallop Trace bilat ankle edema.  ASSESSMENT:  1. Dyspnea is persistent and same lying or sitting. With increased D dimer, concern for PE is great. 2. Elevated Troponin I may be ACS but could also be related to PE. 3. CKD 3 4. LBBB 5. Chest  pain is pleuritic   Plan:  1. IV heparin stat 2. CT angio pulmonary arteries to r/o PE 3. Hydration 4. Replete potassium 5. Cor angio tomorrow if no PE  Signed, Sinclair Grooms 04/20/2015, 8:14 AM

## 2015-04-21 ENCOUNTER — Encounter: Payer: Self-pay | Admitting: *Deleted

## 2015-04-21 DIAGNOSIS — R778 Other specified abnormalities of plasma proteins: Secondary | ICD-10-CM | POA: Insufficient documentation

## 2015-04-21 DIAGNOSIS — R7989 Other specified abnormal findings of blood chemistry: Secondary | ICD-10-CM

## 2015-04-21 DIAGNOSIS — I2692 Saddle embolus of pulmonary artery without acute cor pulmonale: Principal | ICD-10-CM

## 2015-04-21 DIAGNOSIS — I5021 Acute systolic (congestive) heart failure: Secondary | ICD-10-CM

## 2015-04-21 LAB — CBC
HEMATOCRIT: 33.2 % — AB (ref 36.0–46.0)
Hemoglobin: 10.7 g/dL — ABNORMAL LOW (ref 12.0–15.0)
MCH: 28.1 pg (ref 26.0–34.0)
MCHC: 32.2 g/dL (ref 30.0–36.0)
MCV: 87.1 fL (ref 78.0–100.0)
PLATELETS: 157 10*3/uL (ref 150–400)
RBC: 3.81 MIL/uL — ABNORMAL LOW (ref 3.87–5.11)
RDW: 14.1 % (ref 11.5–15.5)
WBC: 7.7 10*3/uL (ref 4.0–10.5)

## 2015-04-21 LAB — HEPARIN LEVEL (UNFRACTIONATED)
HEPARIN UNFRACTIONATED: 0.75 [IU]/mL — AB (ref 0.30–0.70)
HEPARIN UNFRACTIONATED: 0.5 [IU]/mL (ref 0.30–0.70)
Heparin Unfractionated: 1.01 IU/mL — ABNORMAL HIGH (ref 0.30–0.70)

## 2015-04-21 MED ORDER — HEPARIN (PORCINE) IN NACL 100-0.45 UNIT/ML-% IJ SOLN
1000.0000 [IU]/h | INTRAMUSCULAR | Status: AC
  Administered 2015-04-22 – 2015-04-23 (×2): 1000 [IU]/h via INTRAVENOUS
  Filled 2015-04-21 (×2): qty 250

## 2015-04-21 MED ORDER — CARVEDILOL 3.125 MG PO TABS
3.1250 mg | ORAL_TABLET | Freq: Two times a day (BID) | ORAL | Status: DC
  Administered 2015-04-21 – 2015-04-28 (×14): 3.125 mg via ORAL
  Filled 2015-04-21 (×18): qty 1

## 2015-04-21 MED ORDER — FENTANYL CITRATE (PF) 100 MCG/2ML IJ SOLN
25.0000 ug | INTRAMUSCULAR | Status: DC | PRN
  Administered 2015-04-25 (×2): 50 ug via INTRAVENOUS
  Filled 2015-04-21 (×3): qty 2

## 2015-04-21 NOTE — Progress Notes (Signed)
eLink Physician-Brief Progress Note Patient Name: Patricia Fischer DOB: [birth year] MRN: [id number]   Date of Service  04/21/2015  HPI/Events of Note  Headache refractory to home meds, but consistent with chronic headaches she has had for years; neuro exam non-focal  eICU Interventions  Fentanyl prn, monitor neuro status carefully     Intervention Category Intermediate Interventions: Pain - evaluation and management  MCQUAID, DOUGLAS 04/21/2015, 11:31 PM

## 2015-04-21 NOTE — Progress Notes (Signed)
ANTICOAGULATION CONSULT NOTE  Pharmacy Consult for heparin Indication: chest pain/ACS and pulmonary embolus  Allergies  Allergen Reactions  . Gadolinium Derivatives Shortness Of Breath and Nausea And Vomiting  . Sulfa Antibiotics Swelling    "Ears swelled up like Dumbo"  . Aripiprazole     Dry mouth with sores   . Codeine Itching and Rash  . Oxycodone Itching  . Penicillins Hives    Patient Measurements: Height: 5\' 5"  (165.1 cm) Weight: 215 lb (97.523 kg) IBW/kg (Calculated) : 57 Heparin Dosing Weight: 89.2 kg  Vital Signs: Temp: 97.8 F (36.6 C) (08/16 1112) Temp Source: Oral (08/16 1112) BP: 103/54 mmHg (08/16 1112) Pulse Rate: 81 (08/16 1112)  Labs:  Recent Labs  04/19/15 1141 04/19/15 1210 04/19/15 1952 04/20/15 0244 04/20/15 1109 04/20/15 1153 04/20/15 1720 04/21/15 0150 04/21/15 1147  HGB 12.8  --   --   --  12.5  --   --  10.7*  --   HCT 38.3  --   --   --  37.9  --   --  33.2*  --   PLT 223  --   --   --  190  --   --  157  --   LABPROT  --  14.4  --   --   --   --   --   --   --   INR  --  1.10  --   --   --   --   --   --   --   HEPARINUNFRC  --   --   --   --   --   --  0.95* 1.01* 0.75*  CREATININE 1.21*  --   --  1.21* 1.04*  --   --   --   --   TROPONINI  --  0.06* 0.08*  --   --  0.03  --   --   --     Estimated Creatinine Clearance: 66.5 mL/min (by C-G formula based on Cr of 1.04).   Assessment: 59 yo woman admitted 04/19/2015 for r/o PE vs ACS, complaining of dizziness, diaphoresis, & SOB. Elevated D-dimer to 9.54, trop 0.08. CT Chest shows saddle type pulmonary embolus and venous duplex noted with LLE DVT (left distal femoral vein and left popliteal vein)  HL this AM is elevated at 0.75 after hold and decrease to 1100 units/hr  Goal of Therapy:  Heparin level 0.3-0.7 units/ml Monitor platelets by anticoagulation protocol: Yes   Plan:  -Decrease heparin to 1000 units/hr -Heparin level in 6 hours -Daily HL and CBC -Monitor s/sx  bleeding -f/u start of oral anticoagulation   Hildred Laser, Pharm D 04/21/2015 1:57 PM

## 2015-04-21 NOTE — Progress Notes (Signed)
Patient ID: Patricia Fischer, female   DOB: 07-13-1956, 59 y.o.   MRN: [id number] 04/21/15 appointment for a cardiac event monitor was cancelled due to patient hospitalization.
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
## 2015-04-21 NOTE — Progress Notes (Addendum)
Subjective: Patient is very drowsy and states her HA is rated a 5/10 in same location as yesterday.    Objective: Current vital signs: BP 77/43 mmHg  Pulse 60  Temp(Src) 98 F (36.7 C) (Oral)  Resp 22  Ht 5\' 5"  (1.651 m)  Wt 97.523 kg (215 lb)  BMI 35.78 kg/m2  SpO2 97% Vital signs in last 24 hours: Temp:  [97.5 F (36.4 C)-98.2 F (36.8 C)] 98 F (36.7 C) (08/16 0745) Pulse Rate:  [60-79] 60 (08/15 1620) Resp:  [15-22] 22 (08/16 0700) BP: (61-114)/(39-83) 77/43 mmHg (08/15 1620) SpO2:  [95 %-100 %] 97 % (08/16 0700)  Intake/Output from previous day: 08/15 0701 - 08/16 0700 In: 2452.6 [I.V.:1952.6; IV Piggyback:500] Out: 1500 [Urine:1500] Intake/Output this shift:   Nutritional status: Diet heart healthy/carb modified Room service appropriate?: Yes; Fluid consistency:: Thin  Neurologic Exam: General: Mental Status: drowsy, oriented, thought content appropriate.  Speech fluent without evidence of aphasia.  Able to follow 3 step commands without difficulty. Cranial Nerves: II:  Visual fields grossly normal, pupils equal, round, reactive to light and accommodation III,IV, VI: ptosis not present, extra-ocular motions intact bilaterally V,VII: smile symmetric, facial light touch sensation normal bilaterally VIII: hearing normal bilaterally IX,X: uvula rises symmetrically XI: bilateral shoulder shrug XII: midline tongue extension without atrophy or fasciculations  Motor: Right :Upper extremity 5/5ROM limited by RTC surgeryLeft: Upper extremity 5/5 Lower extremity 5/5Lower extremity 5/5 Tone and bulk:normal tone throughout; no atrophy noted Sensory: Pinprick and light touch intact throughout, bilaterally Deep Tendon Reflexes:  1+ throughout and no AJ  Plantars: Mute bilaterally    Lab Results: Basic Metabolic Panel:  Recent Labs Lab 04/19/15 1141  04/20/15 0244 04/20/15 1109  NA 137 139 138  K 3.0* 2.9* 4.1  CL 100* 102 105  CO2 27 27 24   GLUCOSE 115* 117* 103*  BUN 18 16 15   CREATININE 1.21* 1.21* 1.04*  CALCIUM 8.8* 8.7* 8.6*    Liver Function Tests:  Recent Labs Lab 04/20/15 0244 04/20/15 1153  AST 21 23  ALT 13* 13*  ALKPHOS 73 79  BILITOT 0.3 0.7  PROT 6.0* 6.5  ALBUMIN 2.9* 3.1*   No results for input(s): LIPASE, AMYLASE in the last 168 hours. No results for input(s): AMMONIA in the last 168 hours.  CBC:  Recent Labs Lab 04/19/15 1141 04/20/15 1109 04/21/15 0150  WBC 8.6 8.6 7.7  HGB 12.8 12.5 10.7*  HCT 38.3 37.9 33.2*  MCV 85.5 86.9 87.1  PLT 223 190 157    Cardiac Enzymes:  Recent Labs Lab 04/19/15 1210 04/19/15 1952 04/20/15 1153  TROPONINI 0.06* 0.08* 0.03    Lipid Panel:  Recent Labs Lab 04/20/15 0244  CHOL 140  TRIG 164*  HDL 29*  CHOLHDL 4.8  VLDL 33  LDLCALC 78    CBG: No results for input(s): GLUCAP in the last 168 hours.  Microbiology: Results for orders placed or performed during the hospital encounter of 04/19/15  MRSA PCR Screening     Status: None   Collection Time: 04/19/15  3:35 PM  Result Value Ref Range Status   MRSA by PCR NEGATIVE NEGATIVE Final    Comment:        The GeneXpert MRSA Assay (FDA approved for NASAL specimens only), is one component of a comprehensive MRSA colonization surveillance program. It is not intended to diagnose MRSA infection nor to guide or monitor treatment for MRSA infections.     Coagulation Studies:  Recent Labs  04/19/15 1210  LABPROT 14.4  INR 1.10    Imaging: Dg Chest 2 View  04/19/2015   CLINICAL DATA:  Acute chest pain.  EXAM: CHEST  2 VIEW  COMPARISON:  November 22, 2013.  FINDINGS: The heart size and mediastinal contours are within normal limits. Both lungs are clear. No pneumothorax or pleural effusion is noted. The visualized skeletal structures are unremarkable.  IMPRESSION: No active cardiopulmonary  disease.   Electronically Signed   By: Marijo Conception, M.D.   On: 04/19/2015 12:42   Ct Head Wo Contrast  04/20/2015   CLINICAL DATA:  Headaches  EXAM: CT HEAD WITHOUT CONTRAST  TECHNIQUE: Contiguous axial images were obtained from the base of the skull through the vertex without intravenous contrast.  COMPARISON:  None.  FINDINGS: The bony calvarium is intact. No gross soft tissue abnormality is noted. No findings to suggest acute hemorrhage, acute infarction or space-occupying mass lesion are noted.  IMPRESSION: No acute abnormality seen.   Electronically Signed   By: Inez Catalina M.D.   On: 04/20/2015 13:25   Ct Angio Chest Pe W/cm &/or Wo Cm  04/20/2015   CLINICAL DATA:  Shortness of breath and chest pain  EXAM: CT ANGIOGRAPHY CHEST WITH CONTRAST  TECHNIQUE: Multidetector CT imaging of the chest was performed using the standard protocol during bolus administration of intravenous contrast. Multiplanar CT image reconstructions and MIPs were obtained to evaluate the vascular anatomy.  CONTRAST:  141mL OMNIPAQUE IOHEXOL 350 MG/ML SOLN  COMPARISON:  Chest radiograph April 19, 2015  FINDINGS: There is saddle type pulmonary embolus extending through both main pulmonary arteries with extension into multiple peripheral branches bilaterally. There is a degree of pulmonary embolus in most peripheral lobes in segments bilaterally. There is right heart strain with right ventricular to left ventricular diameter ratio of 1.1.  There is no appreciable thoracic aortic aneurysm. Great vessels appear normal.  There is patchy atelectasis in both lower lobes, slightly more on the left than on the right and in the posterior segment right upper lobe. There is no frank edema or consolidation.  Thyroid appears normal. There is no appreciable thoracic adenopathy. The pericardium is not thickened.  Visualized upper abdominal structures are normal except for surgical absence of the gallbladder.  There is degenerative change in the  thoracic spine. There are no blastic or lytic bone lesions.  Review of the MIP images confirms the above findings.  IMPRESSION: Positive for acute PE with CT evidence of right heart strain (RV/LV Ratio = 1.1) consistent with at least submassive (intermediate risk) PE. The presence of right heart strain has been associated with an increased risk of morbidity and mortality. Please activate Code PE by paging [phone].  Areas of patchy atelectasis. No lung edema or consolidation. No adenopathy. Gallbladder absent.  Critical Value/emergent results were called by telephone at the time of interpretation on 04/20/2015 at 10:30 am to Cecilie Kicks, NP , who verbally acknowledged these results.   Electronically Signed   By: Lowella Grip III M.D.   On: 04/20/2015 10:31    Medications:  Scheduled: . amitriptyline  50 mg Oral QHS  . aspirin EC  81 mg Oral Daily  . atorvastatin  40 mg Oral q1800  . gabapentin  600 mg Oral TID  . hydrochlorothiazide  25 mg Oral Daily  . losartan  100 mg Oral Daily  . metoprolol tartrate  12.5 mg Oral BID  . pantoprazole  40 mg Oral Q0600  . potassium chloride  20 mEq Oral  Daily  . sodium chloride  3 mL Intravenous Q12H  . topiramate  150 mg Oral QHS   EEG: This is an abnormal EEG secondary to general background slowing. This finding may be seen with a diffuse disturbance that is etiologically nonspecific. No epileptiform activity was noted.   Assessment/Plan:  59 YO female followed closely by Dr. Jannifer Franklin neurology as a outpatient for her HA and transient episodes of presyncope. While in hospital cardiac monitor has shown PVC's but no other abnormalities. EEG showed no epileptiform activity. Currently patient is admitted for saddel pulmonary embolism. Current HA appears consistent with occipital neuralgia.  As noted previously, Dr. Jannifer Franklin is currently working on having patient follow up out patient with [facility] for second opinion and treatment options.   Recommend: 1)  Out patient follow up for chronic HA 2) At this time see no contraindication for use of Heparin or oral AC for PE 3) Consider prolonged cardiac monitoring when discharged.   No further recommendations. Neurology will S/O     Etta Quill PA-C [facility] [phone]  04/21/2015, 8:58 AM

## 2015-04-21 NOTE — Progress Notes (Signed)
ANTICOAGULATION CONSULT NOTE  Pharmacy Consult for heparin Indication: chest pain/ACS and pulmonary embolus  Allergies  Allergen Reactions  . Gadolinium Derivatives Shortness Of Breath and Nausea And Vomiting  . Sulfa Antibiotics Swelling    "Ears swelled up like Dumbo"  . Aripiprazole     Dry mouth with sores   . Codeine Itching and Rash  . Oxycodone Itching  . Penicillins Hives    Patient Measurements: Height: 5\' 5"  (165.1 cm) Weight: 215 lb (97.523 kg) IBW/kg (Calculated) : 57 Heparin Dosing Weight: 89.2 kg  Vital Signs: Temp: 97.7 F (36.5 C) (08/16 1952) Temp Source: Oral (08/16 1952) BP: 96/51 mmHg (08/16 1952) Pulse Rate: 90 (08/16 1952)  Labs:  Recent Labs  04/19/15 1141 04/19/15 1210 04/19/15 1952 04/20/15 0244 04/20/15 1109 04/20/15 1153  04/21/15 0150 04/21/15 1147 04/21/15 2010  HGB 12.8  --   --   --  12.5  --   --  10.7*  --   --   HCT 38.3  --   --   --  37.9  --   --  33.2*  --   --   PLT 223  --   --   --  190  --   --  157  --   --   LABPROT  --  14.4  --   --   --   --   --   --   --   --   INR  --  1.10  --   --   --   --   --   --   --   --   HEPARINUNFRC  --   --   --   --   --   --   < > 1.01* 0.75* 0.50  CREATININE 1.21*  --   --  1.21* 1.04*  --   --   --   --   --   TROPONINI  --  0.06* 0.08*  --   --  0.03  --   --   --   --   < > = values in this interval not displayed.  Estimated Creatinine Clearance: 66.5 mL/min (by C-G formula based on Cr of 1.04).   Assessment: 59 yo woman admitted 04/19/2015 for r/o PE vs ACS, complaining of dizziness, diaphoresis, & SOB. Elevated D-dimer to 9.54, trop 0.08. CT Chest shows saddle type pulmonary embolus and venous duplex noted with LLE DVT (left distal femoral vein and left popliteal vein)  HL this AM is elevated at 0.75 after hold and decrease to 1100 units/hr  PM Heparin level now therapeutic  Goal of Therapy:  Heparin level 0.3-0.7 units/ml Monitor platelets by anticoagulation  protocol: Yes   Plan:  -Continue heparin at 1000 units / hr -Daily HL and CBC -Monitor s/sx bleeding -f/u start of oral anticoagulation   Thank you Anette Guarneri, PharmD [phone]  04/21/2015 9:00 PM

## 2015-04-21 NOTE — Progress Notes (Signed)
Patient Name: Patricia Fischer Date of Encounter: 04/21/2015    SUBJECTIVE: Very anxious. Short of breath if she moves around too much. He had a very difficult time conceptualizing her medical illness. She is afraid that she has "done something wrong" to cause the problem.  TELEMETRY:  Sinus rhythm to sinus tachycardia Filed Vitals:   04/21/15 0800 04/21/15 0900 04/21/15 1000 04/21/15 1002  BP:    103/51  Pulse:    85  Temp:      TempSrc:      Resp: 21 17 14    Height:      Weight:      SpO2: 100% 93% 96%     Intake/Output Summary (Last 24 hours) at 04/21/15 1107 Last data filed at 04/21/15 1000  Gross per 24 hour  Intake 2680.58 ml  Output   1230 ml  Net 1450.58 ml   LABS: Basic Metabolic Panel:  Recent Labs  04/20/15 0244 04/20/15 1109  NA 139 138  K 2.9* 4.1  CL 102 105  CO2 27 24  GLUCOSE 117* 103*  BUN 16 15  CREATININE 1.21* 1.04*  CALCIUM 8.7* 8.6*   CBC:  Recent Labs  04/20/15 1109 04/21/15 0150  WBC 8.6 7.7  HGB 12.5 10.7*  HCT 37.9 33.2*  MCV 86.9 87.1  PLT 190 157   Cardiac Enzymes:  Recent Labs  04/19/15 1210 04/19/15 1952 04/20/15 1153  TROPONINI 0.06* 0.08* 0.03   BNP    Component Value Date/Time   BNP 459.6* 04/20/2015 1109    Hemoglobin A1C: No results for input(s): HGBA1C in the last 72 hours. Fasting Lipid Panel:  Recent Labs  04/20/15 0244  CHOL 140  HDL 29*  LDLCALC 78  TRIG 164*  CHOLHDL 4.8   Echocardiogram: 04/20/15 Study Conclusions  - Left ventricle: Septal dyssynergy related to BBB and RV abnormalities. The cavity size was normal. Wall thickness was increased in a pattern of moderate LVH. Systolic function was severely reduced. The estimated ejection fraction was in the range of 25% to 30%. Diffuse hypokinesis. - Right ventricle: The cavity size was moderately to severely dilated. Systolic function was severely reduced. - Right atrium: The atrium was mildly dilated. - Pulmonary  arteries: PA peak pressure: 45 mm Hg (S).  Radiology/Studies:  No new data Pulmonary CT angiogram reviewed  Physical Exam: Blood pressure 103/51, pulse 85, temperature 98 F (36.7 C), temperature source Oral, resp. rate 14, height 5\' 5"  (1.651 m), weight 97.523 kg (215 lb), SpO2 96 %. Weight change:   Wt Readings from Last 3 Encounters:  04/19/15 97.523 kg (215 lb)  11/04/14 105.597 kg (232 lb 12.8 oz)  10/03/14 105.235 kg (232 lb)    S4 gallop Neck vein elevation still noted No edema Chest is clear Neuro reveals anxiety but no focal deficit  ASSESSMENT:  1. Saddle pulmonary embolus with borderline hemodynamics, stable overnight on IV heparin 2. Left ventricular systolic dysfunction, acute and in the presence of moderate LVH likely related to poorly controlled hypertension over time. 3. Elevated troponin related to pulmonary embolism and demand ischemia 4. Anxiety disorder 5. Shock and lactic acidosis have resolved  Plan:  1. Continue IV heparin 2. Relative immobility for another 24 hours 3. Will titrate heart failure therapy as tolerated by blood pressure. We will use an ARB inhibitor and carvedilol. Her blood pressures is still relatively soft. 4. Increased risk of further complications. 5. Remain in unit for now  Signed, Sinclair Grooms  04/21/2015, 11:07 AM

## 2015-04-21 NOTE — Progress Notes (Signed)
ANTICOAGULATION CONSULT NOTE  Pharmacy Consult for heparin Indication: chest pain/ACS and pulmonary embolus  Allergies  Allergen Reactions  . Gadolinium Derivatives Shortness Of Breath and Nausea And Vomiting  . Sulfa Antibiotics Swelling    "Ears swelled up like Dumbo"  . Aripiprazole     Dry mouth with sores   . Codeine Itching and Rash  . Oxycodone Itching  . Penicillins Hives    Patient Measurements: Height: 5\' 5"  (165.1 cm) Weight: 215 lb (97.523 kg) IBW/kg (Calculated) : 57 Heparin Dosing Weight: 89.2 kg  Vital Signs: Temp: 97.7 F (36.5 C) (08/16 0000) Temp Source: Oral (08/16 0000) BP: 77/43 mmHg (08/15 1620) Pulse Rate: 60 (08/15 1620)  Labs:  Recent Labs  04/19/15 1141 04/19/15 1210 04/19/15 1952 04/20/15 0244 04/20/15 1109 04/20/15 1153 04/20/15 1720 04/21/15 0150  HGB 12.8  --   --   --  12.5  --   --  10.7*  HCT 38.3  --   --   --  37.9  --   --  33.2*  PLT 223  --   --   --  190  --   --  157  LABPROT  --  14.4  --   --   --   --   --   --   INR  --  1.10  --   --   --   --   --   --   HEPARINUNFRC  --   --   --   --   --   --  0.95* 1.01*  CREATININE 1.21*  --   --  1.21* 1.04*  --   --   --   TROPONINI  --  0.06* 0.08*  --   --  0.03  --   --     Estimated Creatinine Clearance: 66.5 mL/min (by C-G formula based on Cr of 1.04).   Assessment: 59 yo woman admitted 04/19/2015 for r/o PE vs ACS, complaining of dizziness, diaphoresis, & SOB. Elevated D-dimer to 9.54, trop 0.08. CT Chest done today shows "saddle type pulmonary embolus extending through both main pulmonary arteries with extension into multiple peripheral branches bilaterally. There is a degree of pulmonary embolus in most peripheral lobes in segments bilaterally. There is right heart strain with right ventricular to left ventricular diameter ratio of 1.1"  HL this AM elevated at 1.01 despite decreasing infusion rate earlier. RN reports no complications with infusion and no s/s of  bleeding. H/H down to 10.7/33.2. Plt 157k   Goal of Therapy:  Heparin level 0.3-0.7 units/ml Monitor platelets by anticoagulation protocol: Yes   Plan:  -Hold heparin infusion x 1 hour and resume heparin at 1100 units/h -HL in 6h at 0100 -Daily HL and CBC -Monitor s/sx bleeding -f/u start of oral anticoagulation and/or lytic therapy  Albertina Parr, PharmD., BCPS Clinical Pharmacist Pager [phone]

## 2015-04-22 DIAGNOSIS — R570 Cardiogenic shock: Secondary | ICD-10-CM

## 2015-04-22 LAB — BASIC METABOLIC PANEL
Anion gap: 6 (ref 5–15)
BUN: 11 mg/dL (ref 6–20)
CHLORIDE: 108 mmol/L (ref 101–111)
CO2: 25 mmol/L (ref 22–32)
CREATININE: 0.95 mg/dL (ref 0.44–1.00)
Calcium: 8.7 mg/dL — ABNORMAL LOW (ref 8.9–10.3)
GFR calc Af Amer: >60 mL/min (ref >60)
GFR calc non Af Amer: >60 mL/min (ref >60)
GLUCOSE: 110 mg/dL — AB (ref 65–99)
POTASSIUM: 3.5 mmol/L (ref 3.5–5.1)
SODIUM: 139 mmol/L (ref 135–145)

## 2015-04-22 LAB — CBC
HEMATOCRIT: 33.1 % — AB (ref 36.0–46.0)
HEMOGLOBIN: 10.7 g/dL — AB (ref 12.0–15.0)
MCH: 28.2 pg (ref 26.0–34.0)
MCHC: 32.3 g/dL (ref 30.0–36.0)
MCV: 87.3 fL (ref 78.0–100.0)
Platelets: 156 10*3/uL (ref 150–400)
RBC: 3.79 MIL/uL — ABNORMAL LOW (ref 3.87–5.11)
RDW: 14.1 % (ref 11.5–15.5)
WBC: 7.5 10*3/uL (ref 4.0–10.5)

## 2015-04-22 LAB — HEPARIN LEVEL (UNFRACTIONATED): HEPARIN UNFRACTIONATED: 0.4 [IU]/mL (ref 0.30–0.70)

## 2015-04-22 NOTE — Progress Notes (Signed)
Pulled 25 mcg fentanyl from pyxis and wasted the extra. Fentanyl was never given to pt due to pt falling asleep. Wasted in sharps, witnessed by [employer] manager.

## 2015-04-22 NOTE — Progress Notes (Signed)
Pts visitors came out of the room to alert nurse that pt wasn't acting "normal", pt didn't know her pastors name and one other visitor that was with them. Upon entering the room, the pt called the nurse by her name, see charting for same as baseline neuro assessment done upon entering pts room at this time. VSS, Neuro assessment negative, pt AAOx4, NAD. Pt is however tearful asking the nurse how long she has to stay in the hospital for her blood clots, and asked if she was going to die. Nurse sat beside pt and educated as well as gave her emotional support. Will cont to monitor pt closely.

## 2015-04-22 NOTE — Progress Notes (Addendum)
       Patient Name: Patricia Fischer Date of Encounter: 04/22/2015    SUBJECTIVE: Tearful and distraught. Continued difficulty with grasping her illness but quite concerned about her ability to control recurrences. Does now, in the left lower extremity have been painful prior to admission.  TELEMETRY:  Sinus rhythm without atrial arrhythmia Filed Vitals:   04/22/15 0015 04/22/15 0300 04/22/15 0800 04/22/15 1000  BP: 84/38 101/51    Pulse: 83 84    Temp:   97.9 F (36.6 C)   TempSrc:   Oral   Resp: 15 17 17 21   Height:      Weight:      SpO2: 92% 94% 97% 97%    Intake/Output Summary (Last 24 hours) at 04/22/15 1109 Last data filed at 04/22/15 1000  Gross per 24 hour  Intake   2638 ml  Output   3920 ml  Net  -1282 ml   LABS: Basic Metabolic Panel:  Recent Labs  04/20/15 0244 04/20/15 1109  NA 139 138  K 2.9* 4.1  CL 102 105  CO2 27 24  GLUCOSE 117* 103*  BUN 16 15  CREATININE 1.21* 1.04*  CALCIUM 8.7* 8.6*   CBC:  Recent Labs  04/21/15 0150 04/22/15 0329  WBC 7.7 7.5  HGB 10.7* 10.7*  HCT 33.2* 33.1*  MCV 87.1 87.3  PLT 157 156   Cardiac Enzymes:  Recent Labs  04/19/15 1210 04/19/15 1952 04/20/15 1153  TROPONINI 0.06* 0.08* 0.03   Fasting Lipid Panel:  Recent Labs  04/20/15 0244  CHOL 140  HDL 29*  LDLCALC 78  TRIG 164*  CHOLHDL 4.8   Venous Duplex/ultrasound study: 04/20/15 Left femoral and calf DVT/clot  Radiology/Studies:  No recent data  Physical Exam: Blood pressure 101/51, pulse 84, temperature 97.9 F (36.6 C), temperature source Oral, resp. rate 21, height 5\' 5"  (1.651 m), weight 97.523 kg (215 lb), SpO2 97 %. Weight change:   Wt Readings from Last 3 Encounters:  04/19/15 97.523 kg (215 lb)  11/04/14 105.597 kg (232 lb 12.8 oz)  10/03/14 105.235 kg (232 lb)    Very very anxious and tearful Moderate JVD Chest is clear Cardiac exam with S4 gallop. No murmur No peripheral edema  ASSESSMENT:  1. Submassive  pulmonary embolism with saddle embolus on CT angiogram of lung. Hemodynamically stable currently 2. Left lower extremity DVT 3. Acute on chronic systolic heart failure. Etiology of systolic heart failure is unknown. Possibly related to prolonged poorly controlled hypertension as LVH is present. EF less than 35% 4. Left bundle branch block intermittently 5. Anxiety disorder 6. Essential hypertension With excellent blood pressure control currently  Plan:  1. Adjust therapy for LV systolic dysfunction. I will discontinue hydrochlorothiazide, optimize beta blocker therapy, and continue losartan  2. Continue IV heparin and limited physical activity for one more day  3. Check basic metabolic panel and hemoglobin  4. Will require an ischemic evaluation after she is stable from a standpoint of pulmonary embolus  5. Remain in unit until ambulatory and stable   Signed, Sinclair Grooms 04/22/2015, 11:09 AM
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
## 2015-04-22 NOTE — Progress Notes (Signed)
ANTICOAGULATION CONSULT NOTE  Pharmacy Consult for heparin Indication: chest pain/ACS and pulmonary embolus  Allergies  Allergen Reactions  . Gadolinium Derivatives Shortness Of Breath and Nausea And Vomiting  . Sulfa Antibiotics Swelling    "Ears swelled up like Dumbo"  . Aripiprazole     Dry mouth with sores   . Codeine Itching and Rash  . Oxycodone Itching  . Penicillins Hives    Patient Measurements: Height: 5\' 5"  (165.1 cm) Weight: 215 lb (97.523 kg) IBW/kg (Calculated) : 57 Heparin Dosing Weight: 89.2 kg  Vital Signs: Temp: 97.9 F (36.6 C) (08/17 0800) Temp Source: Oral (08/17 0800) BP: 101/51 mmHg (08/17 0300) Pulse Rate: 84 (08/17 0300)  Labs:  Recent Labs  04/19/15 1141 04/19/15 1210 04/19/15 1952 04/20/15 0244 04/20/15 1109 04/20/15 1153  04/21/15 0150 04/21/15 1147 04/21/15 2010 04/22/15 0329 04/22/15 0330  HGB 12.8  --   --   --  12.5  --   --  10.7*  --   --  10.7*  --   HCT 38.3  --   --   --  37.9  --   --  33.2*  --   --  33.1*  --   PLT 223  --   --   --  190  --   --  157  --   --  156  --   LABPROT  --  14.4  --   --   --   --   --   --   --   --   --   --   INR  --  1.10  --   --   --   --   --   --   --   --   --   --   HEPARINUNFRC  --   --   --   --   --   --   < > 1.01* 0.75* 0.50  --  0.40  CREATININE 1.21*  --   --  1.21* 1.04*  --   --   --   --   --   --   --   TROPONINI  --  0.06* 0.08*  --   --  0.03  --   --   --   --   --   --   < > = values in this interval not displayed.  Estimated Creatinine Clearance: 66.5 mL/min (by C-G formula based on Cr of 1.04).   Assessment: 59 yo woman admitted 04/19/2015 for r/o PE vs ACS, complaining of dizziness, diaphoresis, & SOB. Elevated D-dimer to 9.54, trop 0.08. CT Chest shows saddle type pulmonary embolus and venous duplex noted with LLE DVT (left distal femoral vein and left popliteal vein)  HL therapeutic at 0.40 on heparin 1000 units/h  Goal of Therapy:  Heparin level 0.3-0.7  units/ml Monitor platelets by anticoagulation protocol: Yes   Plan:  -Continue heparin at 1000 units / hr -Daily HL and CBC -Monitor s/sx bleeding -f/u start of oral anticoagulation    Heloise Ochoa, Pharm.D. PGY2 Cardiology Pharmacy Resident Pager: [phone] 04/22/2015 10:00 AM

## 2015-04-22 NOTE — Care Management Important Message (Signed)
Important Message  Patient Details  Name: Patricia Fischer MRN: [id number] Date of Birth: 1956/06/09   Medicare Important Message Given:  Yes-second notification given    Pricilla Handler 04/22/2015, 11:59 AM

## 2015-04-23 DIAGNOSIS — R0781 Pleurodynia: Secondary | ICD-10-CM

## 2015-04-23 DIAGNOSIS — I2699 Other pulmonary embolism without acute cor pulmonale: Secondary | ICD-10-CM

## 2015-04-23 LAB — CBC
HEMATOCRIT: 33.9 % — AB (ref 36.0–46.0)
Hemoglobin: 11 g/dL — ABNORMAL LOW (ref 12.0–15.0)
MCH: 28.3 pg (ref 26.0–34.0)
MCHC: 32.4 g/dL (ref 30.0–36.0)
MCV: 87.1 fL (ref 78.0–100.0)
PLATELETS: 159 10*3/uL (ref 150–400)
RBC: 3.89 MIL/uL (ref 3.87–5.11)
RDW: 14.2 % (ref 11.5–15.5)
WBC: 7.1 10*3/uL (ref 4.0–10.5)

## 2015-04-23 LAB — BASIC METABOLIC PANEL
Anion gap: 6 (ref 5–15)
BUN: 11 mg/dL (ref 6–20)
CALCIUM: 9.1 mg/dL (ref 8.9–10.3)
CO2: 27 mmol/L (ref 22–32)
CREATININE: 1.04 mg/dL — AB (ref 0.44–1.00)
Chloride: 108 mmol/L (ref 101–111)
GFR calc Af Amer: >60 mL/min (ref >60)
GFR calc non Af Amer: 57 mL/min — ABNORMAL LOW (ref >60)
GLUCOSE: 117 mg/dL — AB (ref 65–99)
Potassium: 3.8 mmol/L (ref 3.5–5.1)
Sodium: 141 mmol/L (ref 135–145)

## 2015-04-23 LAB — HEPARIN LEVEL (UNFRACTIONATED): Heparin Unfractionated: 0.5 IU/mL (ref 0.30–0.70)

## 2015-04-23 MED ORDER — RIVAROXABAN 15 MG PO TABS
15.0000 mg | ORAL_TABLET | Freq: Two times a day (BID) | ORAL | Status: DC
  Administered 2015-04-23 – 2015-04-29 (×14): 15 mg via ORAL
  Filled 2015-04-23 (×19): qty 1

## 2015-04-23 MED ORDER — RIVAROXABAN 20 MG PO TABS: 20.0000 mg | ORAL_TABLET | Freq: Every day | ORAL | Status: DC

## 2015-04-23 MED ORDER — LOSARTAN POTASSIUM 50 MG PO TABS
50.0000 mg | ORAL_TABLET | Freq: Every day | ORAL | Status: DC
  Administered 2015-04-23: 50 mg via ORAL
  Filled 2015-04-23: qty 1

## 2015-04-23 MED ORDER — TRAMADOL HCL 50 MG PO TABS
50.0000 mg | ORAL_TABLET | Freq: Four times a day (QID) | ORAL | Status: DC | PRN
  Administered 2015-04-23 – 2015-04-28 (×9): 100 mg via ORAL
  Administered 2015-04-28: 50 mg via ORAL
  Filled 2015-04-23 (×2): qty 2
  Filled 2015-04-23: qty 1
  Filled 2015-04-23 (×7): qty 2

## 2015-04-23 NOTE — Progress Notes (Signed)
1204 Came to see pt to walk. Pt still with  A-line. We do not walk pts still requiring this monitoring. RN stated will try to get out today. We will follow up tomorrow. Graylon Good RN BSN 04/23/2015 12:06 PM

## 2015-04-23 NOTE — Progress Notes (Addendum)
       Patient Name: Patricia Fischer Date of Encounter: 04/23/2015    SUBJECTIVE: Tearful and at times having difficulty carrying on a coherent conversation. Poor understanding relative to her underlying condition. Sharp pain left lower sternal border, especially with deep inspiration. Left lower extremity discomfort. The discomfort is been present since admission. It is improved though still present.   TELEMETRY:  NSR and ST Filed Vitals:   04/22/15 2258 04/23/15 0355 04/23/15 0400 04/23/15 0744  BP: 92/53 95/48  111/53  Pulse: 78 77  71  Temp: 97.6 F (36.4 C) 97.3 F (36.3 C)    TempSrc: Oral Oral    Resp: 20 12 12    Height:      Weight:      SpO2: 94% 93% 92%     Intake/Output Summary (Last 24 hours) at 04/23/15 0757 Last data filed at 04/23/15 0600  Gross per 24 hour  Intake   1430 ml  Output   3025 ml  Net  -1595 ml   LABS: Basic Metabolic Panel:  Recent Labs  04/22/15 1225 04/23/15 0437  NA 139 141  K 3.5 3.8  CL 108 108  CO2 25 27  GLUCOSE 110* 117*  BUN 11 11  CREATININE 0.95 1.04*  CALCIUM 8.7* 9.1   CBC:  Recent Labs  04/22/15 0329 04/23/15 0437  WBC 7.5 7.1  HGB 10.7* 11.0*  HCT 33.1* 33.9*  MCV 87.3 87.1  PLT 156 159   Cardiac Enzymes:  Recent Labs  04/20/15 1153  TROPONINI 0.03     Radiology/Studies:  No new data  Physical Exam: Blood pressure 111/53, pulse 71, temperature 97.3 F (36.3 C), temperature source Oral, resp. rate 12, height 5\' 5"  (1.651 m), weight 97.523 kg (215 lb), SpO2 92 %. Weight change:   Wt Readings from Last 3 Encounters:  04/19/15 97.523 kg (215 lb)  11/04/14 105.597 kg (232 lb 12.8 oz)  10/03/14 105.235 kg (232 lb)    Basilar crackles. No pleural rub is heard. Cardiac exam reveals a soft S4 gallop. Neck exam reveals no significant JVD with the patient lying at 40 There is no peripheral edema but tenderness in the left calf.  ASSESSMENT:  1. Submassive pulmonary emboli with saddle embolus  configuration 2. Acute right heart strain with clinical evidence of resolving pulmonary hypertension 3. Chest discomfort, likely related to pulmonary infarction and pleurisy 4. Systolic heart failure, suspected acute on chronic related to poorly controlled blood pressure.  5. Anxiety and depression  Plan:  1. Convert IV heparin to Xarelto pulmonary embolism protocol, 15 mg by mouth twice a day 2. Discontinue aspirin 3. Begin careful ambulation. We'll ask for phase I cardiac rehabilitation 4. If ambulates and maintain stability, we'll consider transferring to floor. 5. If any sudden desaturations or concern for recurrent PE, will need to have a filter placed. 6. Decrease intensity of losartan to allow a little better blood pressure and hopefully help support ambulation  Signed, Sinclair Grooms 04/23/2015, 7:57 AM

## 2015-04-23 NOTE — Care Management Note (Signed)
Case Management Note  Patient Details  Name: Patricia Fischer MRN: [id number] Date of Birth: [birth year]  Subjective/Objective:       Adm w pul embolus             Action/Plan: lives at home, pcp dr Freddi Starr   Expected Discharge Date:                 Expected Discharge Plan:  Home/Self Care  In-House Referral:     Discharge planning Services  CM Consult, Medication Assistance  Post Acute Care Choice:    Choice offered to:     DME Arranged:    DME Agency:     HH Arranged:    [place] Agency:     Status of Service:     Medicare Important Message Given:  Yes-second notification given Date Medicare IM Given:    Medicare IM give by:    Date Additional Medicare IM Given:    Additional Medicare Important Message give by:     If discussed at [place] of Stay Meetings, dates discussed:    Additional Comments: gave pt 30day free xarelto. Pt will need prior auth for xarelto called to [phone] and then copay will be 45.00 per month.  Lacretia Leigh, RN 04/23/2015, 3:13 PM

## 2015-04-23 NOTE — Progress Notes (Addendum)
ANTICOAGULATION CONSULT NOTE  Pharmacy Consult for heparin Indication: chest pain/ACS and pulmonary embolus  Allergies  Allergen Reactions  . Gadolinium Derivatives Shortness Of Breath and Nausea And Vomiting  . Sulfa Antibiotics Swelling    "Ears swelled up like Dumbo"  . Aripiprazole     Dry mouth with sores   . Codeine Itching and Rash  . Oxycodone Itching  . Penicillins Hives    Patient Measurements: Height: 5\' 5"  (165.1 cm) Weight: 215 lb (97.523 kg) IBW/kg (Calculated) : 57 Heparin Dosing Weight: 89.2 kg  Vital Signs: Temp: 97.3 F (36.3 C) (08/18 0355) Temp Source: Oral (08/18 0355) BP: 97/69 mmHg (08/18 0750) Pulse Rate: 71 (08/18 0744)  Labs:  Recent Labs  04/20/15 1109 04/20/15 1153  04/21/15 0150  04/21/15 2010 04/22/15 0329 04/22/15 0330 04/22/15 1225 04/23/15 0437 04/23/15 0438  HGB 12.5  --   --  10.7*  --   --  10.7*  --   --  11.0*  --   HCT 37.9  --   --  33.2*  --   --  33.1*  --   --  33.9*  --   PLT 190  --   --  157  --   --  156  --   --  159  --   HEPARINUNFRC  --   --   < > 1.01*  < > 0.50  --  0.40  --   --  0.50  CREATININE 1.04*  --   --   --   --   --   --   --  0.95 1.04*  --   TROPONINI  --  0.03  --   --   --   --   --   --   --   --   --   < > = values in this interval not displayed.  Estimated Creatinine Clearance: 66.5 mL/min (by C-G formula based on Cr of 1.04).   Assessment: 59 yo woman admitted 04/19/2015 for r/o PE vs ACS, complaining of dizziness, diaphoresis, & SOB. Elevated D-dimer to 9.54, trop 0.08. CT Chest shows saddle type pulmonary embolus and venous duplex noted with LLE DVT (left distal femoral vein and left popliteal vein).  HL therapeutic, CBC stable.  Switching heparin to oral anticoagulation with Xarelto. Will need BID Xarelto dosing for first 21 days.   Goal of Therapy:  Monitor platelets by anticoagulation protocol: Yes   Plan:  -Stop heparin at noon -Start Xarelto 15 mg BID with meals at noon  today x 21 days, then Xarelto 20 mg qday with meal starting 05/14/15 -Monitor CBC, s/sx bleeding -Xarelto pt education prior to discharge   Heloise Ochoa, [place].D. PGY2 Cardiology Pharmacy Resident Pager: [phone] 04/23/2015 9:14 AM

## 2015-04-24 LAB — CBC
HCT: 36.2 % (ref 36.0–46.0)
Hemoglobin: 11.8 g/dL — ABNORMAL LOW (ref 12.0–15.0)
MCH: 28.8 pg (ref 26.0–34.0)
MCHC: 32.6 g/dL (ref 30.0–36.0)
MCV: 88.3 fL (ref 78.0–100.0)
PLATELETS: 167 10*3/uL (ref 150–400)
RBC: 4.1 MIL/uL (ref 3.87–5.11)
RDW: 14.1 % (ref 11.5–15.5)
WBC: 7.8 10*3/uL (ref 4.0–10.5)

## 2015-04-24 LAB — BASIC METABOLIC PANEL
Anion gap: 10 (ref 5–15)
BUN: 12 mg/dL (ref 6–20)
CO2: 26 mmol/L (ref 22–32)
CREATININE: 1.07 mg/dL — AB (ref 0.44–1.00)
Calcium: 9 mg/dL (ref 8.9–10.3)
Chloride: 101 mmol/L (ref 101–111)
GFR calc Af Amer: >60 mL/min (ref >60)
GFR, EST NON AFRICAN AMERICAN: 55 mL/min — AB (ref >60)
GLUCOSE: 107 mg/dL — AB (ref 65–99)
POTASSIUM: 4.2 mmol/L (ref 3.5–5.1)
SODIUM: 137 mmol/L (ref 135–145)

## 2015-04-24 MED ORDER — LOSARTAN POTASSIUM 25 MG PO TABS
25.0000 mg | ORAL_TABLET | Freq: Every day | ORAL | Status: DC
  Administered 2015-04-24 – 2015-04-29 (×6): 25 mg via ORAL
  Filled 2015-04-24 (×5): qty 1

## 2015-04-24 NOTE — Progress Notes (Signed)
CARDIAC REHAB PHASE I   PRE:  Rate/Rhythm: 78 SR  BP:  Supine: 114/62  Sitting:   Standing:    SaO2: 100% 2L  MODE:  Ambulation: 80 ft   POST:  Rate/Rhythm: 103 ST  BP:  Supine:   Sitting: 125/76  Standing:    SaO2: 92%RA 1455-1527 We came earlier and pt asleep and still with A-line. Talked with RN and came back. Pt walked 80 ft with gait belt use, rolling walker and asst x 2. Pt kept eyes closed most of walk. C/o of left leg pain and numbness. Very small steps,encouraged to stay close to walker. Denied SOB. To recliner with call bell. Keep as x 2.   Graylon Good, RN BSN  04/24/2015 3:23 PM

## 2015-04-24 NOTE — Care Management Important Message (Signed)
Important Message  Patient Details  Name: Patricia Fischer MRN: [id number] Date of Birth: 23-Feb-1956   Medicare Important Message Given:  Yes-fourth notification given    Lacretia Leigh, RN 04/24/2015, 9:51 AM

## 2015-04-24 NOTE — Progress Notes (Signed)
       Patient Name: Patricia Fischer Date of Encounter: 04/24/2015    SUBJECTIVE: No acute chest pain or hemodynamic issues. Still complaining of left calf discomfort. Ambulated without difficulty. Having back discomfort.  TELEMETRY:  Normal sinus rhythm Filed Vitals:   04/24/15 0000 04/24/15 0319 04/24/15 0843 04/24/15 0935  BP:  90/51 123/78 123/78  Pulse:  77 77   Temp:  97.9 F (36.6 C) 97.4 F (36.3 C)   TempSrc:  Oral Oral   Resp: 12 11 11    Height:      Weight:      SpO2: 94% 94% 96%     Intake/Output Summary (Last 24 hours) at 04/24/15 1038 Last data filed at 04/24/15 0846  Gross per 24 hour  Intake    600 ml  Output   2975 ml  Net  -2375 ml   LABS: Basic Metabolic Panel:  Recent Labs  04/23/15 0437 04/24/15 0610  NA 141 137  K 3.8 4.2  CL 108 101  CO2 27 26  GLUCOSE 117* 107*  BUN 11 12  CREATININE 1.04* 1.07*  CALCIUM 9.1 9.0   CBC:  Recent Labs  04/23/15 0437 04/24/15 0610  WBC 7.1 7.8  HGB 11.0* 11.8*  HCT 33.9* 36.2  MCV 87.1 88.3  PLT 159 167     Radiology/Studies:  No new data  Physical Exam: Blood pressure 123/78, pulse 77, temperature 97.4 F (36.3 C), temperature source Oral, resp. rate 11, height 5\' 5"  (1.651 m), weight 97.523 kg (215 lb), SpO2 96 %. Weight change:   Wt Readings from Last 3 Encounters:  04/19/15 97.523 kg (215 lb)  11/04/14 105.597 kg (232 lb 12.8 oz)  10/03/14 105.235 kg (232 lb)    Faint crackles at the bases S4 gallop No peripheral edema. Minimal tenderness in left calf. No tenderness in the left knee where she also complains of pain  Neuro exhibits odd behavior and difficulty with conversation. Concerned about psychiatric illness/anxiety disorder  ASSESSMENT:  1. Saddle embolus with submassive pulmonary embolus designation and assigned conservative anticoagulation therapy which is now been converted to oral therapy. 2. Unexplained systolic heart failure, without evidence of volume overload.  Will need further investigation, at the very least an ischemic evaluation. Timing for ischemic evaluation has not been determined but I feel could be done much later after the patient has several weeks to months therapy for PE. 3. Possible psychiatric illness 4. History of essential hypertension  Plan:  1. DC A-line 2. Ambulate with cardiac rehabilitation and if does well, transferred to floor 3. Because of embolic burden, discharge home should be conservative, perhaps tendon hospital longer than typical. May be able to discharge later this week in but will leave to the discretion of colleagues covering 4. Repeat EKG  Signed, Sinclair Grooms 04/24/2015, 10:38 AM

## 2015-04-25 LAB — CBC
HCT: 39 % (ref 36.0–46.0)
Hemoglobin: 12.4 g/dL (ref 12.0–15.0)
MCH: 28.7 pg (ref 26.0–34.0)
MCHC: 31.8 g/dL (ref 30.0–36.0)
MCV: 90.3 fL (ref 78.0–100.0)
Platelets: 166 K/uL (ref 150–400)
RBC: 4.32 MIL/uL (ref 3.87–5.11)
RDW: 14.1 % (ref 11.5–15.5)
WBC: 9.1 K/uL (ref 4.0–10.5)

## 2015-04-25 LAB — BASIC METABOLIC PANEL
Anion gap: 6 (ref 5–15)
BUN: 14 mg/dL (ref 6–20)
CALCIUM: 9.2 mg/dL (ref 8.9–10.3)
CO2: 32 mmol/L (ref 22–32)
CREATININE: 1.03 mg/dL — AB (ref 0.44–1.00)
Chloride: 99 mmol/L — ABNORMAL LOW (ref 101–111)
GFR, EST NON AFRICAN AMERICAN: 58 mL/min — AB (ref >60)
GLUCOSE: 111 mg/dL — AB (ref 65–99)
Potassium: 4 mmol/L (ref 3.5–5.1)
Sodium: 137 mmol/L (ref 135–145)

## 2015-04-25 MED ORDER — SPIRONOLACTONE 12.5 MG HALF TABLET
12.5000 mg | ORAL_TABLET | Freq: Every day | ORAL | Status: DC
  Administered 2015-04-25 – 2015-04-26 (×2): 12.5 mg via ORAL
  Filled 2015-04-25 (×2): qty 1

## 2015-04-25 NOTE — Progress Notes (Signed)
Patient ID: Patricia Fischer, female   DOB: Apr 07, 1956, 59 y.o.   MRN: [id number]    SUBJECTIVE: Still having pleuritic left-sided chest pain and some dyspnea.    Scheduled Meds: . amitriptyline  50 mg Oral QHS  . atorvastatin  40 mg Oral q1800  . carvedilol  3.125 mg Oral BID WC  . gabapentin  600 mg Oral TID  . losartan  25 mg Oral Daily  . pantoprazole  40 mg Oral Q0600  . Rivaroxaban  15 mg Oral BID WC  . [START ON 05/14/2015] rivaroxaban  20 mg Oral Q supper  . sodium chloride  3 mL Intravenous Q12H  . spironolactone  12.5 mg Oral Daily  . topiramate  150 mg Oral QHS   Continuous Infusions:  PRN Meds:.sodium chloride, acetaminophen, albuterol, ALPRAZolam, fentaNYL (SUBLIMAZE) injection, nitroGLYCERIN, ondansetron (ZOFRAN) IV, sodium chloride, traMADol, zolpidem    Filed Vitals:   04/24/15 2007 04/24/15 2339 04/25/15 0327 04/25/15 0755  BP: 114/80 106/60 88/48 108/64  Pulse:    83  Temp: 97.8 F (36.6 C) 98 F (36.7 C) 97.6 F (36.4 C) 98 F (36.7 C)  TempSrc: Oral Oral Oral Oral  Resp: 19 11 13 16   Height:      Weight:      SpO2: 92% 96% 98% 98%    Intake/Output Summary (Last 24 hours) at 04/25/15 1037 Last data filed at 04/24/15 2142  Gross per 24 hour  Intake    923 ml  Output    200 ml  Net    723 ml    LABS: Basic Metabolic Panel:  Recent Labs  04/24/15 0610 04/25/15 0243  NA 137 137  K 4.2 4.0  CL 101 99*  CO2 26 32  GLUCOSE 107* 111*  BUN 12 14  CREATININE 1.07* 1.03*  CALCIUM 9.0 9.2   Liver Function Tests: No results for input(s): AST, ALT, ALKPHOS, BILITOT, PROT, ALBUMIN in the last 72 hours. No results for input(s): LIPASE, AMYLASE in the last 72 hours. CBC:  Recent Labs  04/24/15 0610 04/25/15 0243  WBC 7.8 9.1  HGB 11.8* 12.4  HCT 36.2 39.0  MCV 88.3 90.3  PLT 167 166   Cardiac Enzymes: No results for input(s): CKTOTAL, CKMB, CKMBINDEX, TROPONINI in the last 72 hours. BNP: Invalid input(s): POCBNP D-Dimer: No results  for input(s): DDIMER in the last 72 hours. Hemoglobin A1C: No results for input(s): HGBA1C in the last 72 hours. Fasting Lipid Panel: No results for input(s): CHOL, HDL, LDLCALC, TRIG, CHOLHDL, LDLDIRECT in the last 72 hours. Thyroid Function Tests: No results for input(s): TSH, T4TOTAL, T3FREE, THYROIDAB in the last 72 hours.  Invalid input(s): FREET3 Anemia Panel: No results for input(s): VITAMINB12, FOLATE, FERRITIN, TIBC, IRON, RETICCTPCT in the last 72 hours.  RADIOLOGY: Dg Chest 2 View  04/19/2015   CLINICAL DATA:  Acute chest pain.  EXAM: CHEST  2 VIEW  COMPARISON:  November 22, 2013.  FINDINGS: The heart size and mediastinal contours are within normal limits. Both lungs are clear. No pneumothorax or pleural effusion is noted. The visualized skeletal structures are unremarkable.  IMPRESSION: No active cardiopulmonary disease.   Electronically Signed   By: Marijo Conception, M.D.   On: 04/19/2015 12:42   Ct Head Wo Contrast  04/20/2015   CLINICAL DATA:  Headaches  EXAM: CT HEAD WITHOUT CONTRAST  TECHNIQUE: Contiguous axial images were obtained from the base of the skull through the vertex without intravenous contrast.  COMPARISON:  None.  FINDINGS:  The bony calvarium is intact. No gross soft tissue abnormality is noted. No findings to suggest acute hemorrhage, acute infarction or space-occupying mass lesion are noted.  IMPRESSION: No acute abnormality seen.   Electronically Signed   By: Inez Catalina M.D.   On: 04/20/2015 13:25   Ct Angio Chest Pe W/cm &/or Wo Cm  04/20/2015   CLINICAL DATA:  Shortness of breath and chest pain  EXAM: CT ANGIOGRAPHY CHEST WITH CONTRAST  TECHNIQUE: Multidetector CT imaging of the chest was performed using the standard protocol during bolus administration of intravenous contrast. Multiplanar CT image reconstructions and MIPs were obtained to evaluate the vascular anatomy.  CONTRAST:  12mL OMNIPAQUE IOHEXOL 350 MG/ML SOLN  COMPARISON:  Chest radiograph April 19, 2015  FINDINGS: There is saddle type pulmonary embolus extending through both main pulmonary arteries with extension into multiple peripheral branches bilaterally. There is a degree of pulmonary embolus in most peripheral lobes in segments bilaterally. There is right heart strain with right ventricular to left ventricular diameter ratio of 1.1.  There is no appreciable thoracic aortic aneurysm. Great vessels appear normal.  There is patchy atelectasis in both lower lobes, slightly more on the left than on the right and in the posterior segment right upper lobe. There is no frank edema or consolidation.  Thyroid appears normal. There is no appreciable thoracic adenopathy. The pericardium is not thickened.  Visualized upper abdominal structures are normal except for surgical absence of the gallbladder.  There is degenerative change in the thoracic spine. There are no blastic or lytic bone lesions.  Review of the MIP images confirms the above findings.  IMPRESSION: Positive for acute PE with CT evidence of right heart strain (RV/LV Ratio = 1.1) consistent with at least submassive (intermediate risk) PE. The presence of right heart strain has been associated with an increased risk of morbidity and mortality. Please activate Code PE by paging [phone].  Areas of patchy atelectasis. No lung edema or consolidation. No adenopathy. Gallbladder absent.  Critical Value/emergent results were called by telephone at the time of interpretation on 04/20/2015 at 10:30 am to Cecilie Kicks, NP , who verbally acknowledged these results.   Electronically Signed   By: Lowella Grip III M.D.   On: 04/20/2015 10:31    PHYSICAL EXAM General: NAD Neck: Thick, JVP difficult, no thyromegaly or thyroid nodule.  Lungs: Clear to auscultation bilaterally with normal respiratory effort. CV: Nondisplaced PMI.  Heart regular S1/S2, no S3/S4, no murmur.  1+ edema left ankle.    Abdomen: Soft, nontender, no hepatosplenomegaly, no  distention.  Neurologic: Alert and oriented x 3.  Psych: Normal affect. Extremities: No clubbing or cyanosis.   TELEMETRY: Reviewed telemetry pt in NSR  ASSESSMENT AND PLAN: 59 yo with history of headaches presented with acute submassive PE in the setting of presyncope, chest pain, and dyspnea.  1. PE: Submassive PE with LLE DVT.  She is now on Xarelto and will need long-term.  RV moderate to severely enlarged with moderate systolic dysfunction on echo.  Still with pleuritic chest pain and left calf pain from DVT.  No definite trigger: RFs = obesity, mother with h/o VTE, prior h/o breast cancer.  2. Cardiomyopathy: EF 25-30% by echo, newly noted cardiomyopathy.  Uncertain etiology.  No CAD history.  Exam difficult for volume, does not appear significantly overloaded.  - Continue Coreg 3.125 mg daily and losartan 25 mg daily, no BP room to titrate.  - Will add spironolactone 12.5 daily.  - She  will need eventual ischemic evaluation, probably RHC/LHC after 6 months anticoagulation unless there is a pressing reason to do this sooner.   Loralie Champagne 04/25/2015 10:45 AM

## 2015-04-25 NOTE — Progress Notes (Signed)
CARDIAC REHAB PHASE I   PRE:  Rate/Rhythm: 88 sr  BP:  Sitting: 119/65      SaO2: 92 ra  MODE:  Ambulation: 110 ft   POST:  Rate/Rhythm: 96 sr  BP:  Sitting: 119/73     SaO2: 90 ra 1325-1545 Patient alert and talkative when RN entered room. Once in sitting position patient closed eyes and appeared lethargic and sleepy. Willing to walk. Ambulated 110 x 2 assist with RW. Standing rest break taken with complaints of severe L lower leg pain. Resolved with RN lightly rubbing leg. Slow steady gait with mild limp. Post ambulation patient back to bed with call bell and phone in reach. Cardiac Rehab RN notified primary nurse of patients request for pain medication. Will continue to follow and educate prior to discharge.  Santina Evans, BSN 04/25/2015 3:44 PM

## 2015-04-26 DIAGNOSIS — R071 Chest pain on breathing: Secondary | ICD-10-CM

## 2015-04-26 LAB — CBC
HCT: 33.5 % — ABNORMAL LOW (ref 36.0–46.0)
Hemoglobin: 10.9 g/dL — ABNORMAL LOW (ref 12.0–15.0)
MCH: 28.8 pg (ref 26.0–34.0)
MCHC: 32.5 g/dL (ref 30.0–36.0)
MCV: 88.4 fL (ref 78.0–100.0)
PLATELETS: 150 10*3/uL (ref 150–400)
RBC: 3.79 MIL/uL — AB (ref 3.87–5.11)
RDW: 14.1 % (ref 11.5–15.5)
WBC: 9.8 10*3/uL (ref 4.0–10.5)

## 2015-04-26 LAB — BASIC METABOLIC PANEL
Anion gap: 8 (ref 5–15)
Anion gap: 8 (ref 5–15)
BUN: 16 mg/dL (ref 6–20)
BUN: 14 mg/dL (ref 6–20)
CALCIUM: 8.9 mg/dL (ref 8.9–10.3)
CALCIUM: 8.9 mg/dL (ref 8.9–10.3)
CO2: 29 mmol/L (ref 22–32)
CO2: 28 mmol/L (ref 22–32)
CREATININE: 0.96 mg/dL (ref 0.44–1.00)
CREATININE: 0.99 mg/dL (ref 0.44–1.00)
Chloride: 98 mmol/L — ABNORMAL LOW (ref 101–111)
Chloride: 100 mmol/L — ABNORMAL LOW (ref 101–111)
GFR calc Af Amer: >60 mL/min (ref >60)
GFR calc Af Amer: >60 mL/min (ref >60)
Glucose, Bld: 112 mg/dL — ABNORMAL HIGH (ref 65–99)
Glucose, Bld: 113 mg/dL — ABNORMAL HIGH (ref 65–99)
Potassium: 3.7 mmol/L (ref 3.5–5.1)
Potassium: 3.6 mmol/L (ref 3.5–5.1)
SODIUM: 135 mmol/L (ref 135–145)
SODIUM: 136 mmol/L (ref 135–145)

## 2015-04-26 MED ORDER — SPIRONOLACTONE 25 MG PO TABS
25.0000 mg | ORAL_TABLET | Freq: Every day | ORAL | Status: DC
  Administered 2015-04-27 – 2015-04-29 (×3): 25 mg via ORAL
  Filled 2015-04-26 (×4): qty 1

## 2015-04-26 MED ORDER — SPIRONOLACTONE 12.5 MG HALF TABLET
12.5000 mg | ORAL_TABLET | Freq: Once | ORAL | Status: AC
  Administered 2015-04-26: 12.5 mg via ORAL
  Filled 2015-04-26: qty 1

## 2015-04-26 MED ORDER — MORPHINE SULFATE (PF) 2 MG/ML IV SOLN
2.0000 mg | INTRAVENOUS | Status: DC | PRN
  Administered 2015-04-26 – 2015-04-29 (×8): 2 mg via INTRAVENOUS
  Filled 2015-04-26 (×8): qty 1

## 2015-04-26 NOTE — Progress Notes (Addendum)
Patient ID: Patricia Fischer, female   DOB: 1956/01/06, 59 y.o.   MRN: [id number] P   SUBJECTIVE: Still having pleuritic left-sided chest pain and left calf pain.  Uncomfortable.  No dyspnea.  Vitals stable.    Scheduled Meds: . amitriptyline  50 mg Oral QHS  . atorvastatin  40 mg Oral q1800  . carvedilol  3.125 mg Oral BID WC  . gabapentin  600 mg Oral TID  . losartan  25 mg Oral Daily  . pantoprazole  40 mg Oral Q0600  . Rivaroxaban  15 mg Oral BID WC  . [START ON 05/14/2015] rivaroxaban  20 mg Oral Q supper  . sodium chloride  3 mL Intravenous Q12H  . spironolactone  12.5 mg Oral Daily  . topiramate  150 mg Oral QHS   Continuous Infusions:  PRN Meds:.sodium chloride, acetaminophen, albuterol, ALPRAZolam, fentaNYL (SUBLIMAZE) injection, morphine injection, nitroGLYCERIN, ondansetron (ZOFRAN) IV, sodium chloride, traMADol, zolpidem    Filed Vitals:   04/26/15 0116 04/26/15 0311 04/26/15 0715 04/26/15 0949  BP:  111/55 114/77 110/69  Pulse:      Temp:  98.4 F (36.9 C) 97.9 F (36.6 C)   TempSrc:  Oral Axillary   Resp: 13 12 19    Height:      Weight:      SpO2: 98% 98% 95%     Intake/Output Summary (Last 24 hours) at 04/26/15 1008 Last data filed at 04/26/15 0700  Gross per 24 hour  Intake    600 ml  Output    900 ml  Net   -300 ml    LABS: Basic Metabolic Panel:  Recent Labs  04/25/15 0243 04/26/15 0258  NA 137 135  K 4.0 3.7  CL 99* 98*  CO2 32 29  GLUCOSE 111* 112*  BUN 14 16  CREATININE 1.03* 0.96  CALCIUM 9.2 8.9   Liver Function Tests: No results for input(s): AST, ALT, ALKPHOS, BILITOT, PROT, ALBUMIN in the last 72 hours. No results for input(s): LIPASE, AMYLASE in the last 72 hours. CBC:  Recent Labs  04/25/15 0243 04/26/15 0258  WBC 9.1 9.8  HGB 12.4 10.9*  HCT 39.0 33.5*  MCV 90.3 88.4  PLT 166 150   Cardiac Enzymes: No results for input(s): CKTOTAL, CKMB, CKMBINDEX, TROPONINI in the last 72 hours. BNP: Invalid input(s):  POCBNP D-Dimer: No results for input(s): DDIMER in the last 72 hours. Hemoglobin A1C: No results for input(s): HGBA1C in the last 72 hours. Fasting Lipid Panel: No results for input(s): CHOL, HDL, LDLCALC, TRIG, CHOLHDL, LDLDIRECT in the last 72 hours. Thyroid Function Tests: No results for input(s): TSH, T4TOTAL, T3FREE, THYROIDAB in the last 72 hours.  Invalid input(s): FREET3 Anemia Panel: No results for input(s): VITAMINB12, FOLATE, FERRITIN, TIBC, IRON, RETICCTPCT in the last 72 hours.  RADIOLOGY: Dg Chest 2 View  04/19/2015   CLINICAL DATA:  Acute chest pain.  EXAM: CHEST  2 VIEW  COMPARISON:  November 22, 2013.  FINDINGS: The heart size and mediastinal contours are within normal limits. Both lungs are clear. No pneumothorax or pleural effusion is noted. The visualized skeletal structures are unremarkable.  IMPRESSION: No active cardiopulmonary disease.   Electronically Signed   By: Marijo Conception, M.D.   On: 04/19/2015 12:42   Ct Head Wo Contrast  04/20/2015   CLINICAL DATA:  Headaches  EXAM: CT HEAD WITHOUT CONTRAST  TECHNIQUE: Contiguous axial images were obtained from the base of the skull through the vertex without intravenous contrast.  COMPARISON:  None.  FINDINGS: The bony calvarium is intact. No gross soft tissue abnormality is noted. No findings to suggest acute hemorrhage, acute infarction or space-occupying mass lesion are noted.  IMPRESSION: No acute abnormality seen.   Electronically Signed   By: Inez Catalina M.D.   On: 04/20/2015 13:25   Ct Angio Chest Pe W/cm &/or Wo Cm  04/20/2015   CLINICAL DATA:  Shortness of breath and chest pain  EXAM: CT ANGIOGRAPHY CHEST WITH CONTRAST  TECHNIQUE: Multidetector CT imaging of the chest was performed using the standard protocol during bolus administration of intravenous contrast. Multiplanar CT image reconstructions and MIPs were obtained to evaluate the vascular anatomy.  CONTRAST:  193mL OMNIPAQUE IOHEXOL 350 MG/ML SOLN  COMPARISON:   Chest radiograph April 19, 2015  FINDINGS: There is saddle type pulmonary embolus extending through both main pulmonary arteries with extension into multiple peripheral branches bilaterally. There is a degree of pulmonary embolus in most peripheral lobes in segments bilaterally. There is right heart strain with right ventricular to left ventricular diameter ratio of 1.1.  There is no appreciable thoracic aortic aneurysm. Great vessels appear normal.  There is patchy atelectasis in both lower lobes, slightly more on the left than on the right and in the posterior segment right upper lobe. There is no frank edema or consolidation.  Thyroid appears normal. There is no appreciable thoracic adenopathy. The pericardium is not thickened.  Visualized upper abdominal structures are normal except for surgical absence of the gallbladder.  There is degenerative change in the thoracic spine. There are no blastic or lytic bone lesions.  Review of the MIP images confirms the above findings.  IMPRESSION: Positive for acute PE with CT evidence of right heart strain (RV/LV Ratio = 1.1) consistent with at least submassive (intermediate risk) PE. The presence of right heart strain has been associated with an increased risk of morbidity and mortality. Please activate Code PE by paging [phone].  Areas of patchy atelectasis. No lung edema or consolidation. No adenopathy. Gallbladder absent.  Critical Value/emergent results were called by telephone at the time of interpretation on 04/20/2015 at 10:30 am to Cecilie Kicks, NP , who verbally acknowledged these results.   Electronically Signed   By: Lowella Grip III M.D.   On: 04/20/2015 10:31    PHYSICAL EXAM General: NAD Neck: Thick, JVP difficult, no thyromegaly or thyroid nodule.  Lungs: Clear to auscultation bilaterally with normal respiratory effort. CV: Nondisplaced PMI.  Heart regular S1/S2, no S3/S4, no murmur.  1+ edema left ankle.    Abdomen: Soft, nontender, no  hepatosplenomegaly, no distention.  Neurologic: Alert and oriented x 3.  Psych: Normal affect. Extremities: No clubbing or cyanosis.   TELEMETRY: Reviewed telemetry pt in NSR  ASSESSMENT AND PLAN: 59 yo with history of headaches presented with acute submassive PE in the setting of presyncope, chest pain, and dyspnea.  1. PE: Submassive PE with LLE DVT.  She is now on Xarelto and will need long-term.  RV moderate to severely enlarged with moderate systolic dysfunction on echo.  Still with pleuritic chest pain and left calf pain from DVT.  No definite trigger: RFs = obesity, mother with h/o VTE, prior h/o breast cancer.  She is hemodynamically stable (BP ok, no tachycardia, on room air).  Will need ongoing pain management.  2. Cardiomyopathy: EF 25-30% by echo, newly noted cardiomyopathy.  Uncertain etiology.  No CAD history.  Exam difficult for volume, does not appear significantly overloaded.  - Continue Coreg 3.125 mg  daily and losartan 25 mg daily, BP low at times so will not titrate.  - Increase spironolactone to 25 mg daily.  - She will need eventual ischemic evaluation, probably RHC/LHC after 6 months anticoagulation unless there is a pressing reason to do this sooner.  3. Disposition: Given ongoing pain issues, not ready for discharge.   Loralie Champagne 04/26/2015 10:08 AM

## 2015-04-26 NOTE — Progress Notes (Signed)
[facility] for heparin Indication: pulmonary embolus  Allergies  Allergen Reactions  . Gadolinium Derivatives Shortness Of Breath and Nausea And Vomiting  . Sulfa Antibiotics Swelling    "Ears swelled up like Dumbo"  . Aripiprazole     Dry mouth with sores   . Codeine Itching and Rash  . Oxycodone Itching  . Penicillins Hives    Patient Measurements: Height: 5\' 5"  (165.1 cm) Weight: 215 lb (97.523 kg) IBW/kg (Calculated) : 57 Heparin Dosing Weight: 89.2 kg  Vital Signs: Temp: 97.7 F (36.5 C) (08/21 1123) Temp Source: Oral (08/21 1123) BP: 114/68 mmHg (08/21 1123)  Labs:  Recent Labs  04/24/15 0610 04/25/15 0243 04/26/15 0258  HGB 11.8* 12.4 10.9*  HCT 36.2 39.0 33.5*  PLT 167 166 150  CREATININE 1.07* 1.03* 0.96    Estimated Creatinine Clearance: 72 mL/min (by C-G formula based on Cr of 0.96).   Assessment: 59 yo woman admitted 04/19/2015 for PE. Elevated D-dimer to 9.54, trop 0.08. CT Chest shows saddle type pulmonary embolus and venous duplex noted with LLE DVT (left distal femoral vein and left popliteal vein).    Hgb 10.9, plt wnl. No bleeding noted. Transitioned from heparin to Xarelto on 8/18. Will need BID Xarelto dosing for first 21 days. Pt has been educated on Xarelto use, dosing, and side effects.  Goal of Therapy:  Monitor platelets by anticoagulation protocol: Yes   Plan:  -Continue Xarelto 15 mg BID with meals at noon today until 05/13/15 -Then start Xarelto 20 mg qday with meal on 05/14/15 -Monitor CBC, s/sx bleeding   Heloise Ochoa, Pharm.D. PGY2 Cardiology Pharmacy Resident Pager: [phone] 04/26/2015 11:55 AM

## 2015-04-27 LAB — CBC
HCT: 33.1 % — ABNORMAL LOW (ref 36.0–46.0)
Hemoglobin: 10.8 g/dL — ABNORMAL LOW (ref 12.0–15.0)
MCH: 29.2 pg (ref 26.0–34.0)
MCHC: 32.6 g/dL (ref 30.0–36.0)
MCV: 89.5 fL (ref 78.0–100.0)
PLATELETS: 151 10*3/uL (ref 150–400)
RBC: 3.7 MIL/uL — ABNORMAL LOW (ref 3.87–5.11)
RDW: 14.2 % (ref 11.5–15.5)
WBC: 9.8 10*3/uL (ref 4.0–10.5)

## 2015-04-27 NOTE — Care Management Important Message (Signed)
Important Message  Patient Details  Name: Patricia Fischer MRN: [id number] Date of Birth: 06/15/1956   Medicare Important Message Given:  Yes-fourth notification given    Lacretia Leigh, RN 04/27/2015, 9:20 AM

## 2015-04-27 NOTE — Progress Notes (Signed)
CARDIAC REHAB PHASE I   PRE:  Rate/Rhythm: 13 SR c/ PVCs  BP:  Lying: 107/57        SaO2: 94 RA  MODE:  Ambulation: 260 ft   POST:  Rate/Rhythm: 93 SR c/ PVCs  BP:  Sitting: 124/72         SaO2: 95 RA  Pt in bed, states she doesn't feel well today , agreeable to walk. Pt ambulated 260 ft on RA, gait belt, assist x2 (pt declined use of walker, did hold to hand rail frequently), slow, mildly unsteady gait, tolerated fair. Pt denies CP, dizziness, DOE, did c/o leg pain and fatigue, standing rest x3. Pt pushed herself to ambulate farther today, but repeatedly stated how she was" very tired" after walk. Pt more talkative and alert today, but seems to have difficulty comprehending conversation and is somewhat impulsive and unsteady and see would benefit from use of walker. If not agreeable, would keep as x2. Pt to recliner after walk, call bell within reach.   [phone]  Lenna Sciara, RN, BSN 04/27/2015 11:52 AM

## 2015-04-27 NOTE — Progress Notes (Signed)
Patient ID: Patricia Fischer, female   DOB: 10-Sep-1955, 60 y.o.   MRN: [id number] P  59 yo admitted on Aug. 14 with CP Found to have a saddle pulmonary embolus. Has also been found to have new LV dysfunction  Still not feeling all that well    SUBJECTIVE:    Scheduled Meds: . amitriptyline  50 mg Oral QHS  . atorvastatin  40 mg Oral q1800  . carvedilol  3.125 mg Oral BID WC  . gabapentin  600 mg Oral TID  . losartan  25 mg Oral Daily  . pantoprazole  40 mg Oral Q0600  . Rivaroxaban  15 mg Oral BID WC  . [START ON 05/14/2015] rivaroxaban  20 mg Oral Q supper  . sodium chloride  3 mL Intravenous Q12H  . spironolactone  25 mg Oral Daily  . topiramate  150 mg Oral QHS   Continuous Infusions:  PRN Meds:.sodium chloride, acetaminophen, albuterol, ALPRAZolam, fentaNYL (SUBLIMAZE) injection, morphine injection, nitroGLYCERIN, ondansetron (ZOFRAN) IV, sodium chloride, traMADol, zolpidem    Filed Vitals:   04/26/15 2304 04/27/15 0214 04/27/15 0301 04/27/15 0755  BP: 106/73  106/71 112/67  Pulse:    73  Temp: 97.8 F (36.6 C)  98 F (36.7 C) 97.5 F (36.4 C)  TempSrc: Oral  Oral Oral  Resp: 17 13 13 19   Height:      Weight:      SpO2: 93% 100% 99% 98%    Intake/Output Summary (Last 24 hours) at 04/27/15 0938 Last data filed at 04/26/15 2304  Gross per 24 hour  Intake    240 ml  Output    800 ml  Net   -560 ml    LABS: Basic Metabolic Panel:  Recent Labs  04/26/15 0258 04/26/15 1202  NA 135 136  K 3.7 3.6  CL 98* 100*  CO2 29 28  GLUCOSE 112* 113*  BUN 16 14  CREATININE 0.96 0.99  CALCIUM 8.9 8.9   Liver Function Tests: No results for input(s): AST, ALT, ALKPHOS, BILITOT, PROT, ALBUMIN in the last 72 hours. No results for input(s): LIPASE, AMYLASE in the last 72 hours. CBC:  Recent Labs  04/26/15 0258 04/27/15 0215  WBC 9.8 9.8  HGB 10.9* 10.8*  HCT 33.5* 33.1*  MCV 88.4 89.5  PLT 150 151   Cardiac Enzymes: No results for input(s): CKTOTAL, CKMB,  CKMBINDEX, TROPONINI in the last 72 hours. BNP: Invalid input(s): POCBNP D-Dimer: No results for input(s): DDIMER in the last 72 hours. Hemoglobin A1C: No results for input(s): HGBA1C in the last 72 hours. Fasting Lipid Panel: No results for input(s): CHOL, HDL, LDLCALC, TRIG, CHOLHDL, LDLDIRECT in the last 72 hours. Thyroid Function Tests: No results for input(s): TSH, T4TOTAL, T3FREE, THYROIDAB in the last 72 hours.  Invalid input(s): FREET3 Anemia Panel: No results for input(s): VITAMINB12, FOLATE, FERRITIN, TIBC, IRON, RETICCTPCT in the last 72 hours.  RADIOLOGY: Dg Chest 2 View  04/19/2015   CLINICAL DATA:  Acute chest pain.  EXAM: CHEST  2 VIEW  COMPARISON:  November 22, 2013.  FINDINGS: The heart size and mediastinal contours are within normal limits. Both lungs are clear. No pneumothorax or pleural effusion is noted. The visualized skeletal structures are unremarkable.  IMPRESSION: No active cardiopulmonary disease.   Electronically Signed   By: Marijo Conception, M.D.   On: 04/19/2015 12:42   Ct Head Wo Contrast  04/20/2015   CLINICAL DATA:  Headaches  EXAM: CT HEAD WITHOUT CONTRAST  TECHNIQUE: Contiguous  axial images were obtained from the base of the skull through the vertex without intravenous contrast.  COMPARISON:  None.  FINDINGS: The bony calvarium is intact. No gross soft tissue abnormality is noted. No findings to suggest acute hemorrhage, acute infarction or space-occupying mass lesion are noted.  IMPRESSION: No acute abnormality seen.   Electronically Signed   By: Inez Catalina M.D.   On: 04/20/2015 13:25   Ct Angio Chest Pe W/cm &/or Wo Cm  04/20/2015   CLINICAL DATA:  Shortness of breath and chest pain  EXAM: CT ANGIOGRAPHY CHEST WITH CONTRAST  TECHNIQUE: Multidetector CT imaging of the chest was performed using the standard protocol during bolus administration of intravenous contrast. Multiplanar CT image reconstructions and MIPs were obtained to evaluate the vascular  anatomy.  CONTRAST:  132mL OMNIPAQUE IOHEXOL 350 MG/ML SOLN  COMPARISON:  Chest radiograph April 19, 2015  FINDINGS: There is saddle type pulmonary embolus extending through both main pulmonary arteries with extension into multiple peripheral branches bilaterally. There is a degree of pulmonary embolus in most peripheral lobes in segments bilaterally. There is right heart strain with right ventricular to left ventricular diameter ratio of 1.1.  There is no appreciable thoracic aortic aneurysm. Great vessels appear normal.  There is patchy atelectasis in both lower lobes, slightly more on the left than on the right and in the posterior segment right upper lobe. There is no frank edema or consolidation.  Thyroid appears normal. There is no appreciable thoracic adenopathy. The pericardium is not thickened.  Visualized upper abdominal structures are normal except for surgical absence of the gallbladder.  There is degenerative change in the thoracic spine. There are no blastic or lytic bone lesions.  Review of the MIP images confirms the above findings.  IMPRESSION: Positive for acute PE with CT evidence of right heart strain (RV/LV Ratio = 1.1) consistent with at least submassive (intermediate risk) PE. The presence of right heart strain has been associated with an increased risk of morbidity and mortality. Please activate Code PE by paging [phone].  Areas of patchy atelectasis. No lung edema or consolidation. No adenopathy. Gallbladder absent.  Critical Value/emergent results were called by telephone at the time of interpretation on 04/20/2015 at 10:30 am to Cecilie Kicks, NP , who verbally acknowledged these results.   Electronically Signed   By: Lowella Grip III M.D.   On: 04/20/2015 10:31    PHYSICAL EXAM General: NAD Neck: Thick, JVP difficult, no thyromegaly or thyroid nodule.  Lungs: Clear to auscultation bilaterally with normal respiratory effort. CV: Nondisplaced PMI.  Heart regular S1/S2, no  S3/S4, no murmur.    Abdomen: Soft, nontender, no hepatosplenomegaly, no distention.  Neurologic: Alert and oriented x 3.  Psych: Normal affect. Extremities: No clubbing or cyanosis.   TELEMETRY: Reviewed telemetry pt in NSR  ASSESSMENT AND PLAN: 59 yo with history of headaches presented with acute submassive PE in the setting of presyncope, chest pain, and dyspnea.  1. PE: Submassive PE with LLE DVT.  She is now on Xarelto and will need long-term.  RV moderate to severely enlarged with moderate systolic dysfunction on echo.  Still with pleuritic chest pain and left calf pain from DVT.  No definite trigger: RFs = obesity, mother with h/o VTE, prior h/o breast cancer.  She is hemodynamically stable (BP ok, no tachycardia, on room air).  Will need ongoing pain management.  2. Cardiomyopathy: EF 25-30% by echo, newly noted cardiomyopathy.  Uncertain etiology.  No CAD history.  Exam  difficult for volume, does not appear significantly overloaded.  - Continue Coreg 3.125 mg daily and losartan 25 mg daily, BP low at times so will not titrate.  - Increase spironolactone to 25 mg daily.  - She will need eventual ischemic evaluation, probably RHC/LHC after 6 months anticoagulation unless there is a pressing reason to do this sooner.  3. Disposition: Given ongoing pain issues, not ready for discharge.   Transfer to tele   Thayer Headings 04/27/2015 9:37 AM

## 2015-04-27 NOTE — Discharge Instructions (Addendum)
Information on my medicine - XARELTO (rivaroxaban)  This medication education was reviewed with me or my healthcare representative as part of my discharge preparation.  The pharmacist that spoke with me during my hospital stay was:  Heloise Ochoa, PharmD  WHY WAS [place]? Xarelto was prescribed to treat blood clots that may have been found in the veins of your legs (deep vein thrombosis) or in your lungs (pulmonary embolism) and to reduce the risk of them occurring again.  What do you need to know about Xarelto? The starting dose is one 15 mg tablet taken TWICE daily with food for the FIRST 21 DAYS then on 05/14/15 the dose is changed to one 20 mg tablet taken ONCE A DAY with your evening meal.  DO NOT stop taking Xarelto without talking to the health care provider who prescribed the medication.  Refill your prescription for 20 mg tablets before you run out.  After discharge, you should have regular check-up appointments with your healthcare provider that is prescribing your Xarelto.  In the future your dose may need to be changed if your kidney function changes by a significant amount.  What do you do if you miss a dose? If you are taking Xarelto TWICE DAILY and you miss a dose, take it as soon as you remember. You may take two 15 mg tablets (total 30 mg) at the same time then resume your regularly scheduled 15 mg twice daily the next day.  If you are taking Xarelto ONCE DAILY and you miss a dose, take it as soon as you remember on the same day then continue your regularly scheduled once daily regimen the next day. Do not take two doses of Xarelto at the same time.   Important Safety Information Xarelto is a blood thinner medicine that can cause bleeding. You should call your healthcare provider right away if you experience any of the following: ? Bleeding from an injury or your nose that does not stop. ? Unusual colored urine (red or dark brown) or unusual colored  stools (red or black). ? Unusual bruising for unknown reasons. ? A serious fall or if you hit your head (even if there is no bleeding).  Some medicines may interact with Xarelto and might increase your risk of bleeding while on Xarelto. To help avoid this, consult your healthcare provider or pharmacist prior to using any new prescription or non-prescription medications, including herbals, vitamins, non-steroidal anti-inflammatory drugs (NSAIDs) and supplements.  This website has more information on Xarelto: [email or web address].[email or web address]  Seek medical attention if has sudden onset of significant shortness of breath. Ambulate as tolerated.

## 2015-04-27 NOTE — Progress Notes (Signed)
Pt moaning and crying out. Called to pt's room.  Pt c/o hurting all over.   Pt Stated "  Hurting bad all over".  "where am I, how long have I been here". "  Is my phone still plugged in"?  bp stable .  Pt alert and oriented.  pt wanted something for pain.  Pt stated "medication did help before".  While given pt iv med.  Pt started snoring.  Will continue to montitor. [name] T

## 2015-04-28 MED ORDER — CARVEDILOL 6.25 MG PO TABS
6.2500 mg | ORAL_TABLET | Freq: Two times a day (BID) | ORAL | Status: DC
  Administered 2015-04-28 – 2015-04-29 (×2): 6.25 mg via ORAL
  Filled 2015-04-28 (×3): qty 1

## 2015-04-28 MED ORDER — KETOROLAC TROMETHAMINE 15 MG/ML IJ SOLN
15.0000 mg | Freq: Once | INTRAMUSCULAR | Status: AC
  Administered 2015-04-28: 15 mg via INTRAVENOUS
  Filled 2015-04-28: qty 1

## 2015-04-28 MED ORDER — KETOROLAC TROMETHAMINE 10 MG PO TABS
10.0000 mg | ORAL_TABLET | Freq: Four times a day (QID) | ORAL | Status: DC
  Administered 2015-04-28 – 2015-04-29 (×5): 10 mg via ORAL
  Filled 2015-04-28 (×10): qty 1

## 2015-04-28 NOTE — Progress Notes (Signed)
Report called to Rn [id number]. Patient with no complaints of pain. Will transfer via [place].

## 2015-04-28 NOTE — Progress Notes (Signed)
CARDIAC REHAB PHASE I   PRE:  Rate/Rhythm: 80 sR  BP:  Sitting: 117/82        SaO2: 96 RA  MODE:  Ambulation: 150 ft   POST:  Rate/Rhythm: 95 SR  BP:  Sitting: 117/73         SaO2: 98 RA  Pt still c/o leg pain, remains very sleepy. Pt ambulated 150 ft on RA, rolling walker, gait belt, assist x1, mildly unsteady gait, tolerated fair. Pt c/o leg pain with ambulation (pt received IV toradol just before ambulating), denies CP, dizziness, standing rest x1. Pt remains unsteady and unaware of surroundings, closes eyes regularly during ambulation. Pt returned to room, c/o fatigue, assisted to bathroom, then to recliner, feet elevated, call bell within reach. Pt did not have chair alarm in room, RN and NT aware. Pt c/o SOB at rest, requesting O2 be applied. Pt placed on 2L O2. VSS. Pt also c/o headache, requesting pain medication. RN notified. Will follow.  [id number]  Lenna Sciara, RN, BSN 04/28/2015 3:18 PM

## 2015-04-28 NOTE — Progress Notes (Signed)
Patient Profile: 59 yo admitted on Aug. 14 with CP.  Found to have a saddle pulmonary embolus + DVT. Has also been found to have new LV dysfunction. EF 25-30%.    Subjective: Still with left sided chest pain. Patient notes it feels like a muscle ache. Pleuritic/ worse with deep breathing. She also notes feeling SOB, but O2 sats currently 92-96% on RA. Still with significant left leg pain.   Objective: Vital signs in last 24 hours: Temp:  [97.5 F (36.4 C)-97.8 F (36.6 C)] 97.5 F (36.4 C) (08/23 0806) Pulse Rate:  [71-79] 79 (08/22 1651) Resp:  [11-21] 15 (08/23 0806) BP: (93-124)/(58-78) 119/66 mmHg (08/23 0806) SpO2:  [93 %-99 %] 93 % (08/23 0806) Last BM Date: 04/25/15  Intake/Output from previous day: 08/22 0701 - 08/23 0700 In: 1275 [P.O.:1275] Out: 1900 [Urine:1900] Intake/Output this shift: Total I/O In: 360 [P.O.:360] Out: -   Medications Current Facility-Administered Medications  Medication Dose Route Frequency Provider Last Rate Last Dose  . 0.9 %  sodium chloride infusion  250 mL Intravenous PRN Erlene Quan, PA-C      . acetaminophen (TYLENOL) tablet 650 mg  650 mg Oral Q4H PRN Erlene Quan, PA-C      . albuterol (PROVENTIL) (2.5 MG/3ML) 0.083% nebulizer solution 2.5 mg  2.5 mg Nebulization Q6H PRN Romona Curls, RPH      . ALPRAZolam Duanne Moron) tablet 0.25 mg  0.25 mg Oral TID PRN Erlene Quan, PA-C   0.25 mg at 04/28/15 1324  . amitriptyline (ELAVIL) tablet 50 mg  50 mg Oral QHS Erlene Quan, PA-C   50 mg at 04/27/15 2116  . atorvastatin (LIPITOR) tablet 40 mg  40 mg Oral q1800 Erlene Quan, PA-C   40 mg at 04/27/15 1814  . carvedilol (COREG) tablet 3.125 mg  3.125 mg Oral BID WC Belva Crome, MD   3.125 mg at 04/28/15 4010  . fentaNYL (SUBLIMAZE) injection 25-50 mcg  25-50 mcg Intravenous Q2H PRN Juanito Doom, MD   50 mcg at 04/25/15 1836  . gabapentin (NEURONTIN) capsule 600 mg  600 mg Oral TID Erlene Quan, PA-C   600 mg at 04/28/15 1031  .  losartan (COZAAR) tablet 25 mg  25 mg Oral Daily Belva Crome, MD   25 mg at 04/28/15 1031  . morphine 2 MG/ML injection 2 mg  2 mg Intravenous Q4H PRN Larey Dresser, MD   2 mg at 04/27/15 2118  . nitroGLYCERIN (NITROSTAT) SL tablet 0.4 mg  0.4 mg Sublingual Q5 Min x 3 PRN Doreene Burke Kilroy, PA-C      . ondansetron [facility]) injection 4 mg  4 mg Intravenous Q6H PRN Doreene Burke Kilroy, PA-C      . pantoprazole (PROTONIX) EC tablet 40 mg  40 mg Oral Q0600 Doreene Burke Kilroy, PA-C   40 mg at 04/28/15 0501  . Rivaroxaban (XARELTO) tablet 15 mg  15 mg Oral BID WC Belva Crome, MD   15 mg at 04/28/15 1031  . [START ON 05/14/2015] rivaroxaban (XARELTO) tablet 20 mg  20 mg Oral Q supper Belva Crome, MD      . sodium chloride 0.9 % injection 3 mL  3 mL Intravenous Q12H Erlene Quan, PA-C   3 mL at 04/27/15 2117  . sodium chloride 0.9 % injection 3 mL  3 mL Intravenous PRN Erlene Quan, PA-C   3 mL at 04/19/15 2146  . spironolactone (  ALDACTONE) tablet 25 mg  25 mg Oral Daily Larey Dresser, MD   25 mg at 04/28/15 1031  . topiramate (TOPAMAX) tablet 150 mg  150 mg Oral QHS Erlene Quan, PA-C   150 mg at 04/27/15 2116  . traMADol (ULTRAM) tablet 50-100 mg  50-100 mg Oral Q6H PRN Belva Crome, MD   100 mg at 04/28/15 0443  . zolpidem (AMBIEN) tablet 5 mg  5 mg Oral QHS PRN Erlene Quan, PA-C   5 mg at 04/27/15 2235    PE: General appearance: alert, cooperative, no distress and moderately obese Neck: no carotid bruit and no JVD Lungs: decreased breathsounds bilaterally Heart: regular rate and rhythm, S1, S2 normal, no murmur, click, rub or gallop Extremities: left LE slightly larger compared to contralateral side Pulses: 2+ and symmetric Skin: warm and dry Neurologic: Grossly normal  Lab Results:   Recent Labs  04/26/15 0258 04/27/15 0215  WBC 9.8 9.8  HGB 10.9* 10.8*  HCT 33.5* 33.1*  PLT 150 151   BMET  Recent Labs  04/26/15 0258 04/26/15 1202  NA 135 136  K 3.7 3.6  CL 98* 100*  CO2 29  28  GLUCOSE 112* 113*  BUN 16 14  CREATININE 0.96 0.99  CALCIUM 8.9 8.9    Assessment/Plan  Active Problems:   Headache   LBBB (left bundle branch block)-rate related    Essential hypertension   Normal coronary arteries-10 yrs ago   Obesity-BMI 35   Saddle embolus of pulmonary artery without acute cor pulmonale   Shock   Cardiogenic shock   Acute systolic heart failure   Elevated troponin   1. PE/DVT: Submassive PE with LLE DVT. She is now on Xarelto and will need long-term. RV moderate to severely enlarged with moderate systolic dysfunction on echo. Still with pleuritic chest pain and left calf pain from DVT. No definite trigger: RFs = obesity, mother with h/o VTE, prior h/o breast cancer. She is hemodynamically stable (BP ok, no tachycardia, O2 sats 92-96% on room air). Continue Xarelto 15 mg BID x 21 days followed by 20 mg daily. Will need ongoing pain management.    2. Cardiomyopathy: EF 25-30% by echo, newly noted cardiomyopathy. Uncertain etiology. No CAD history. Appears stable from a volume standpoint. No arrhthymias on telemetry. She will need eventual ischemic evaluation, probably RHC/LHC after 6 months of anticoagulation unless there is a pressing reason to do this sooner. For now continue medical therapy.   -Continue Coreg 3.125 mg BID, losartan 25 mg daily, and spironolactone to 25 mg daily.      LOS: 8 days    Brittainy M. Ladoris Gene 04/28/2015 11:18 AM  Attending Note:   The patient was seen and examined.  Agree with assessment and plan as noted above.  Changes made to the above note as needed. 1. Pulmonary embolus - currently on Xarelto 15 BID. Breathing is ok   2. Chronic systolic CHF:   Will increase coreg to 6.25 bid Continue Losartan 25 a day  Will arrange for a cath   3. Pain :   She continues to have left shoulder pain and left ankle pain . I do not think this is related to her pulmonary embolus - will start Toradol Also has  chronic headaches.     Thayer Headings, Brooke Bonito., MD, [facility] 04/28/2015, 2:34 PM [address],  [place] Pager [phone]

## 2015-04-29 MED ORDER — RIVAROXABAN 20 MG PO TABS: 20.0000 mg | ORAL_TABLET | Freq: Every day | ORAL | Status: DC

## 2015-04-29 MED ORDER — SPIRONOLACTONE 25 MG PO TABS: 25.0000 mg | ORAL_TABLET | Freq: Every day | ORAL | Status: DC

## 2015-04-29 MED ORDER — PANTOPRAZOLE SODIUM 40 MG PO TBEC: 40.0000 mg | DELAYED_RELEASE_TABLET | Freq: Every day | ORAL | Status: DC

## 2015-04-29 MED ORDER — RIVAROXABAN (XARELTO) VTE STARTER PACK (15 & 20 MG): ORAL_TABLET | ORAL | Status: DC

## 2015-04-29 MED ORDER — LOSARTAN POTASSIUM 25 MG PO TABS: 25.0000 mg | ORAL_TABLET | Freq: Every day | ORAL | Status: DC

## 2015-04-29 MED ORDER — CARVEDILOL 6.25 MG PO TABS: 6.2500 mg | ORAL_TABLET | Freq: Two times a day (BID) | ORAL | Status: DC

## 2015-04-29 MED ORDER — ATORVASTATIN CALCIUM 40 MG PO TABS
40.0000 mg | ORAL_TABLET | Freq: Every day | ORAL | Status: DC
Start: 2015-04-29 — End: 2015-08-27

## 2015-04-29 NOTE — Progress Notes (Signed)
Patient Name: Patricia Fischer Date of Encounter: 04/29/2015  Primary Cardiologist: New   Active Problems:   Headache   LBBB (left bundle branch block)-rate related    Essential hypertension   Normal coronary arteries-10 yrs ago   Obesity-BMI 73   Saddle embolus of pulmonary artery without acute cor pulmonale   Shock   Cardiogenic shock   Acute systolic heart failure   Elevated troponin   Patient Profile: 59 yo admitted on Aug. 14 with CP. Found to have a saddle pulmonary embolus + DVT. Has also been found to have new LV dysfunction. EF 25-30%.    SUBJECTIVE  Denies SOB, pain in shoulder, L chest and LLE. Patient was ambulating with cardiac rehab, state leg pain is bad, worse than when she had shoulder surgery.   CURRENT MEDS . amitriptyline  50 mg Oral QHS  . atorvastatin  40 mg Oral q1800  . carvedilol  6.25 mg Oral BID WC  . gabapentin  600 mg Oral TID  . ketorolac  10 mg Oral 4 times per day  . losartan  25 mg Oral Daily  . pantoprazole  40 mg Oral Q0600  . Rivaroxaban  15 mg Oral BID WC  . [START ON 05/14/2015] rivaroxaban  20 mg Oral Q supper  . sodium chloride  3 mL Intravenous Q12H  . spironolactone  25 mg Oral Daily  . topiramate  150 mg Oral QHS    OBJECTIVE  Filed Vitals:   04/28/15 1728 04/28/15 2033 04/29/15 0357 04/29/15 0853  BP: 109/69 97/64 103/62 100/58  Pulse: 74 82 84 86  Temp:  97.9 F (36.6 C) 98.6 F (37 C)   TempSrc:  Oral Oral   Resp:  18 17   Height:      Weight:      SpO2:  97% 94%     Intake/Output Summary (Last 24 hours) at 04/29/15 1105 Last data filed at 04/29/15 1048  Gross per 24 hour  Intake    960 ml  Output   1250 ml  Net   -290 ml   Filed Weights   04/19/15 1122  Weight: 215 lb (97.523 kg)    PHYSICAL EXAM  General: Pleasant, NAD. Neuro: Alert and oriented X 3. Moves all extremities spontaneously. Psych: Normal affect. HEENT:  Normal  Neck: Supple without bruits or JVD. Lungs:  Resp regular and  unlabored, CTA.  Heart: RRR no s3, s4, or murmurs. Abdomen: Soft, non-tender, non-distended, BS + x 4.  Extremities: No clubbing, cyanosis or edema. DP/PT/Radials 2+ and equal bilaterally.  Accessory Clinical Findings  CBC  Recent Labs  04/27/15 0215  WBC 9.8  HGB 10.8*  HCT 33.1*  MCV 89.5  PLT 563   Basic Metabolic Panel  Recent Labs  04/26/15 1202  NA 136  K 3.6  CL 100*  CO2 28  GLUCOSE 113*  BUN 14  CREATININE 0.99  CALCIUM 8.9    TELE NSR with HR 80s    ECG  No new EKG  Echocardiogram 04/20/2015  LV EF: 25% -  30%  ------------------------------------------------------------------- Indications:   Pulmonary embolus 415.19.  ------------------------------------------------------------------- History:  PMH: Chest pain with moderate risk for cardiac etiology. Headache. LBBB-rate related. Syncope. Risk factors: Hypertension.  ------------------------------------------------------------------- Study Conclusions  - Left ventricle: Septal dyssynergy related to BBB and RV abnormalities. The cavity size was normal. Wall thickness was increased in a pattern of moderate LVH. Systolic function was severely reduced. The estimated ejection fraction was in the range  of 25% to 30%. Diffuse hypokinesis. - Right ventricle: The cavity size was moderately to severely dilated. Systolic function was severely reduced. - Right atrium: The atrium was mildly dilated. - Pulmonary arteries: PA peak pressure: 45 mm Hg (S).     Radiology/Studies  Dg Chest 2 View  04/19/2015   CLINICAL DATA:  Acute chest pain.  EXAM: CHEST  2 VIEW  COMPARISON:  November 22, 2013.  FINDINGS: The heart size and mediastinal contours are within normal limits. Both lungs are clear. No pneumothorax or pleural effusion is noted. The visualized skeletal structures are unremarkable.  IMPRESSION: No active cardiopulmonary disease.   Electronically Signed   By: Marijo Conception, M.D.    On: 04/19/2015 12:42   Ct Head Wo Contrast  04/20/2015   CLINICAL DATA:  Headaches  EXAM: CT HEAD WITHOUT CONTRAST  TECHNIQUE: Contiguous axial images were obtained from the base of the skull through the vertex without intravenous contrast.  COMPARISON:  None.  FINDINGS: The bony calvarium is intact. No gross soft tissue abnormality is noted. No findings to suggest acute hemorrhage, acute infarction or space-occupying mass lesion are noted.  IMPRESSION: No acute abnormality seen.   Electronically Signed   By: Inez Catalina M.D.   On: 04/20/2015 13:25   Ct Angio Chest Pe W/cm &/or Wo Cm  04/20/2015   CLINICAL DATA:  Shortness of breath and chest pain  EXAM: CT ANGIOGRAPHY CHEST WITH CONTRAST  TECHNIQUE: Multidetector CT imaging of the chest was performed using the standard protocol during bolus administration of intravenous contrast. Multiplanar CT image reconstructions and MIPs were obtained to evaluate the vascular anatomy.  CONTRAST:  157mL OMNIPAQUE IOHEXOL 350 MG/ML SOLN  COMPARISON:  Chest radiograph April 19, 2015  FINDINGS: There is saddle type pulmonary embolus extending through both main pulmonary arteries with extension into multiple peripheral branches bilaterally. There is a degree of pulmonary embolus in most peripheral lobes in segments bilaterally. There is right heart strain with right ventricular to left ventricular diameter ratio of 1.1.  There is no appreciable thoracic aortic aneurysm. Great vessels appear normal.  There is patchy atelectasis in both lower lobes, slightly more on the left than on the right and in the posterior segment right upper lobe. There is no frank edema or consolidation.  Thyroid appears normal. There is no appreciable thoracic adenopathy. The pericardium is not thickened.  Visualized upper abdominal structures are normal except for surgical absence of the gallbladder.  There is degenerative change in the thoracic spine. There are no blastic or lytic bone lesions.   Review of the MIP images confirms the above findings.  IMPRESSION: Positive for acute PE with CT evidence of right heart strain (RV/LV Ratio = 1.1) consistent with at least submassive (intermediate risk) PE. The presence of right heart strain has been associated with an increased risk of morbidity and mortality. Please activate Code PE by paging [phone].  Areas of patchy atelectasis. No lung edema or consolidation. No adenopathy. Gallbladder absent.  Critical Value/emergent results were called by telephone at the time of interpretation on 04/20/2015 at 10:30 am to Cecilie Kicks, NP , who verbally acknowledged these results.   Electronically Signed   By: Lowella Grip III M.D.   On: 04/20/2015 10:31    ASSESSMENT AND PLAN  1. PE/DVT: submassive PE with LLE DVT  - started on Xarelto 15 mg BID x 21 days followed by 20 mg daily. RV moderate to severely enlarged with moderate systolic dysfunction on echo.  -  currently on day 7 of xarelto, switch to 20mg  daily xarelto on sep 8th.   - likely can be discharged. She keeps complaining of L leg pain and L breast atypical pain worsen with deep inspiration, however only option at this time is ambulate as much as tolerated. Advised regarding harm of sitting still for long period of time.   2. Newly diagnosed cardiomyopathy:  - EF 25-30% by echo, newly noted cardiomyopathy  - continue coreg, losartan and spironolactone.  - will need eventual ischemic evaluation, probably RHC/LHC after 6 months of anticoagulation unless there is a pressing reason to do this sooner.   3. Atypical chest pain and continuous LLE pain: continue neurontin, PRN toradol.   Hilbert Corrigan PA-C Pager: [phone]  Attending Note:   The patient was seen and examined.  Agree with assessment and plan as noted above.  Changes made to the above note as needed.  She is doing well from a cardiac standpoint. PE / DVT is being treated with Xarelto May need lifelong Xarelto    2.  Chronic systolic CHF:  Continue coreg, losartan, spirinolactone The plan is for R/L heart cath in 6 months .    Thayer Headings, Brooke Bonito., MD, [facility] 04/29/2015, 1:53 PM [address],  [place] Pager [phone]

## 2015-04-29 NOTE — Progress Notes (Signed)
CARDIAC REHAB PHASE I   PRE:  Rate/Rhythm: 80 SR  BP:  Supine:   Sitting: 90/60  Standing:    SaO2: 93%RA  MODE:  Ambulation: 350 ft   POST:  Rate/Rhythm: 97 SR  BP:  Supine:   Sitting: 106/66  Standing:    SaO2: 95%RA 1058-1144 Pt walked 350 ft on RA with rolling walker and minimal asst. Gait fairly steady but c/o severe left leg pain by end of walk. Had received morphine earlier for pain. To recliner after walk. Pt tearful. Stated she does not remember things. Left CHF booklet and encouraged low salt. Encouraged pt to read booklet. Having hard time concentrating at this time.   Graylon Good, RN BSN  04/29/2015 11:40 AM

## 2015-04-29 NOTE — Progress Notes (Signed)
Patients insists Tylenol and Tramadol do not control her pain would want a "stronger" medication.

## 2015-04-29 NOTE — Progress Notes (Signed)
Pt walked 100 ft with no distress or complications with walker. O2 stayed stated at 95%.

## 2015-04-29 NOTE — Progress Notes (Signed)
Pt ambulated with RN in hallway. She complained of some dizziness. B/P checked was 106/64, HR of 84 prior to ambulating. During walk around the Nurse's station, she made one stop. B/P rechecked-- was 90/68 and HR of 82. She was able to go around the Nurse's station and back to her bedroom. MD discharged pt to home. PIV and telemetry were discontinued. I reviewed discharge instructions with pt. She verbalized understanding. Pt waiting for son to pick her up and take her home.

## 2015-04-29 NOTE — Plan of Care (Signed)
Problem: Consults Goal: Chest Pain Patient Education (See Patient Education module for education specifics.)  Outcome: Progressing Pt denies any chest pain so far this shift. She has been ambulating to bathroom and she ambulated with cardiac rehab RN. Her only complaint was of left leg pain. Will continue to monitor.

## 2015-04-29 NOTE — Discharge Summary (Signed)
Discharge Summary   Patient ID: Patricia Fischer,  MRN: [id number], DOB/AGE: 02/18/1956 59 y.o.  Admit date: 04/19/2015 Discharge date: 04/29/2015  Primary Care Provider: Golden Pop Primary Cardiologist: new - Dr. Tamala Julian  Discharge Diagnoses Principal Problem:   Saddle embolus of pulmonary artery without acute cor pulmonale Active Problems:   Headache   LBBB (left bundle branch block)-rate related    Essential hypertension   Normal coronary arteries-10 yrs ago   Obesity-BMI 35   Shock   Cardiogenic shock   Acute systolic heart failure   Elevated troponin   Allergies Allergies  Allergen Reactions  . Gadolinium Derivatives Shortness Of Breath and Nausea And Vomiting  . Sulfa Antibiotics Swelling    "Ears swelled up like Dumbo"  . Aripiprazole     Dry mouth with sores   . Codeine Itching and Rash  . Oxycodone Itching  . Penicillins Hives    Procedures  CTA of chest 04/20/2015 IMPRESSION: Positive for acute PE with CT evidence of right heart strain (RV/LV Ratio = 1.1) consistent with at least submassive (intermediate risk) PE. The presence of right heart strain has been associated with an increased risk of morbidity and mortality. Please activate Code PE by paging [phone].    Echocardiogram  04/20/2015 LV EF: 25% -  30%  ------------------------------------------------------------------- Indications:   Pulmonary embolus 415.19.  ------------------------------------------------------------------- History:  PMH: Chest pain with moderate risk for cardiac etiology. Headache. LBBB-rate related. Syncope. Risk factors: Hypertension.  ------------------------------------------------------------------- Study Conclusions  - Left ventricle: Septal dyssynergy related to BBB and RV abnormalities. The cavity size was normal. Wall thickness was increased in a pattern of moderate LVH. Systolic function was severely reduced. The estimated ejection  fraction was in the range of 25% to 30%. Diffuse hypokinesis. - Right ventricle: The cavity size was moderately to severely dilated. Systolic function was severely reduced. - Right atrium: The atrium was mildly dilated. - Pulmonary arteries: PA peak pressure: 45 mm Hg (S).   LE vascular U/S 04/20/2015  Summary:  - Findings consistent with acute deep vein thrombosis involving the left femoral vein, left popliteal vein, left posterial tibial vein, and left peroneal vein. - No evidence of deep vein thrombosis involving the right lower extremity. - No evidence of Baker&'s cyst on the right or left.   EEG 04/20/2015 IMPRESSION: This is an abnormal EEG secondary to general background slowing. This finding may be seen with a diffuse disturbance that is etiologically nonspecific. No epileptiform activity was noted.     Hospital Course  The patient is a 59 year old Caucasian female with no prior cardiac history who presented to [facility] on 04/19/2015 for evaluation of dizziness with diaphoresis and shortness of breath. The symptom spontaneously occurred in the morning of admission when she got up to take a shower. It happened again while getting dressed spontaneously. On her way to the church, she had another episode and EMS was called. She was evaluated 2 years ago by Dr. Nicolasa Ducking. She had a cardiac cath at that time which showed her coronary were "ok". EKG by EMS showed left bundle branch block with heart rate 124. Initially a code STEMI was activated, however this was canceled after review of EKG by cardiology and felt left bundle branch block is old. Nitroglycerin did not help. She was anxious and tearful in the ED and admits to high stress level at home. She was admitted to cardiology service. On arrival, she was noted to be hypokalemic with potassium 3.2. Creatinine 1.2. She was  admitted for further evaluation of the symptom. Her d-dimer was noted to be elevated at 9.54, a stat  CTA was obtained which was positive for acute PE with evidence of right heart strain consistent with at least submassive PE. Vascular ultrasound was obtained on the same day which showed left lower extremity is positive for acute occlusive DVT involving the left distal femoral vein and the left popliteal vein, nonocclusive acute DVT involving the left posterior tibial and peroneal veins. Echocardiogram obtained on the same day showed EF 25-30%, diffuse hypokinesis, PA peak pressure 45, moderately to severely dilated RV with severely decreased RV EF.   Neurology was consulted on 8/15 for headache and noted patient has been followed closely by Dr. Jannifer Franklin for headache and frequent syncopal episode. Since Dr. Jannifer Franklin was concerning for outpatient EEG, and she is currently being admitted, EEG was performed as inpatient. ECG performed on 8/15 was abnormal secondary to general background slowing, the finding may be seen in diffuse disturbance that is etiologically nonspecific, however no epileptiform activity was noted. Her symptom was felt to be consistent with possible occipital neuralgia. She was transitioned from IV heparin to Xarelto DVT/PE dosing on 8/18. Her hydrochlorothiazide was discontinued, she was continued on losartan and beta blocker therapy was optimized. She was felt to require ischemic evaluation after stable from the standpoint of pulmonary emboli given abnormal echocardiogram with new LV dysfunction. She did have arterial line placed in the beginning, however this was discontinued on 8/19 as her blood pressure improved. She did have some atypical chest discomfort and left leg throbbing. Given the clot burden, she will need a left and right heart cath after 6 months of anticoagulation. She was transferred to telemetry on 8/22.  When she was seen in the morning of 04/29/2015, she was still having some atypical chest discomfort. Otherwise she is doing good from cardiac perspective with stable blood  pressure and heart rate. She is deemed stable for discharge from cardiology perspective. I had a long discussion with the patient regarding her new LV dysfunction and a potential workup at a later time due to the need for systemic anticoagulation at the present time due to decreased clot burden. I have arranged 7 day transition of care outpatient follow-up with Dr. Calamia Caul office. Patient has been advised to ambulate as she can tolerate. She did ambulate with cardiac rehabilitation in the morning of 8/24, she walked through 350 feet on room air with rolling walker and minimal assist. I have advised the patient on ambulate as tolerated. Prior to discharge, I have called prior authorization for her Xarelto, she has been authorized by her insurance company.   Discharge Vitals Blood pressure 90/58, pulse 84, temperature 98 F (36.7 C), temperature source Oral, resp. rate 18, height 5\' 5"  (1.651 m), weight 215 lb (97.523 kg), SpO2 95 %.  Filed Weights   04/19/15 1122  Weight: 215 lb (97.523 kg)    Labs  CBC  Recent Labs  04/27/15 0215  WBC 9.8  HGB 10.8*  HCT 33.1*  MCV 89.5  PLT 151    Disposition  Pt is being discharged home today in good condition.  Follow-up Plans & Appointments      Follow-up Information    Follow up with Sinclair Grooms, MD.   Specialty:  Cardiology   Why:  Office scheduler will contact you within 2 business days to arrange followup, please give [place] a call if you do not hear from [place]   Contact information:  [address]. [place] [address] [phone]       Follow up with Golden Pop, MD.   Specialty:  Family Medicine   Why:  Please followup with your primary care physician as soon as possible    Contact information:   [place] [address] [phone]       Follow up with Lenor Coffin, MD On 08/18/2015.   Specialty:  Neurology   Why:  8:00am. Neurology followup   Contact information:   [address] [place] [address] [phone]       Discharge Medications    Medication List    STOP taking these medications        losartan-hydrochlorothiazide 100-25 MG per tablet  Commonly known as:  HYZAAR      TAKE these medications        albuterol 108 (90 BASE) MCG/ACT inhaler  Commonly known as:  PROVENTIL HFA;VENTOLIN HFA  Inhale 2 puffs into the lungs every 6 (six) hours as needed for shortness of breath.     amitriptyline 25 MG tablet  Commonly known as:  ELAVIL  take 2 tablets at night     atorvastatin 40 MG tablet  Commonly known as:  LIPITOR  Take 1 tablet (40 mg total) by mouth daily at 6 PM.     carvedilol 6.25 MG tablet  Commonly known as:  COREG  Take 1 tablet (6.25 mg total) by mouth 2 (two) times daily with a meal.     fluticasone 50 MCG/ACT nasal spray  Commonly known as:  FLONASE  Place 1 spray into both nostrils daily as needed for allergies or rhinitis.     gabapentin 300 MG capsule  Commonly known as:  NEURONTIN  Take 600 mg by mouth 3 (three) times daily.     indomethacin 50 MG capsule  Commonly known as:  INDOCIN  Take 1 capsule (50 mg total) by mouth 3 (three) times daily with meals.     levalbuterol 45 MCG/ACT inhaler  Commonly known as:  XOPENEX HFA  Inhale 1 puff into the lungs every 4 (four) hours as needed for wheezing.     losartan 25 MG tablet  Commonly known as:  COZAAR  Take 1 tablet (25 mg total) by mouth daily.     ondansetron 4 MG disintegrating tablet  Commonly known as:  ZOFRAN ODT  Take 1 tablet (4 mg total) by mouth every 8 (eight) hours as needed for nausea or vomiting.     pantoprazole 40 MG tablet  Commonly known as:  PROTONIX  Take 1 tablet (40 mg total) by mouth daily at 6 (six) AM.     Rivaroxaban 15 & 20 MG Tbpk  Commonly known as:  XARELTO STARTER PACK  Take as directed on package: Start with one 15mg  tablet by mouth twice a day with food. On 9/8, switch to one 20mg  tablet once a day with food.  Start from 2nd week as you already took 1 week dose in the hospital     rivaroxaban 20 MG Tabs tablet  Commonly known as:  XARELTO  Take 1 tablet (20 mg total) by mouth daily with supper. To start after finished with start pack     spironolactone 25 MG tablet  Commonly known as:  ALDACTONE  Take 1 tablet (25 mg total) by mouth daily.     tiZANidine 2 MG tablet  Commonly known as:  ZANAFLEX  Take 2 mg by  mouth at bedtime as needed for muscle spasms.     topiramate 100 MG tablet  Commonly known as:  TOPAMAX  TAKE 1 TABLET (100 MG TOTAL) BY MOUTH AT BEDTIME.     traMADol 50 MG tablet  Commonly known as:  ULTRAM  Take 1 tablet (50 mg total) by mouth every 8 (eight) hours as needed.          Duration of Discharge Encounter   Greater than 30 minutes including physician time.  Hilbert Corrigan PA-C Pager: [phone] 04/29/2015, 7:04 PM   Attending Note:   The patient was seen and examined.  Agree with assessment and plan as noted above.  Changes made to the above note as needed.  Pt has been adequately treated for her acute pulmonary embolus. She has had some various pains in shoulder and left ankle that are not related to the PE or DVT I have encouraged her to see her primary medical doctor for these She has ambulated >350 feet with cardiac rehab   Thayer Headings, Brooke Bonito., MD, [facility] 04/30/2015, 9:34 PM [address]  [place] Pager [phone]

## 2015-04-30 ENCOUNTER — Telehealth: Payer: Self-pay | Admitting: Interventional Cardiology

## 2015-04-30 ENCOUNTER — Telehealth: Payer: Self-pay | Admitting: Neurology

## 2015-04-30 NOTE — Telephone Encounter (Signed)
I called the patient. The patient has been treated for a pulmonary embolism. This is likely the source of her multiple syncopal events prior to admission. The patient has right heart failure. The EEG study in the hospital showed nonspecific mild generalized background slowing, no epileptiform discharges. This syncopal events are not seizures. The patient will tend blackout when she stands up quickly. This is a manifestation of right heart failure.

## 2015-04-30 NOTE — Telephone Encounter (Signed)
TCM per Isaac Laud; 9/1 @ 10am w/ [place]

## 2015-04-30 NOTE — Telephone Encounter (Signed)
Patient called to cancel EEG in our office because it was done while she was inpatient in [facility]. Would like to see Dr. Jannifer Franklin. Was told there is something on the EEG that he needs to talk to her about.

## 2015-04-30 NOTE — Telephone Encounter (Signed)
Patient contacted regarding discharge from [facility] on 8/24   Patient understands to follow up with provider ? 9/1 @ 10 AMto arrive 15 minutes early with Ellen Henri, PA Patient understands discharge instructions? Yes  Patient understands medications and regiment? Yes, pt has not picked up any of her new medications as she does not feel comfortable to drive. Pt stated that she had not walked or gone to the bathroom alone while in the hospital and they d/c'd her home and she is there alone.  Her son lives next door and will help her after work but not there during the day.  Pt stated he can go pick up her medications this afternoon after work.  We reviewed her medications and she expressed understanding.  Patient understands to bring all medications to this visit? Yes Pt stated that she has had 2 falls today trying to go to the bathroom. She stated she things she stood up to fast and slowly feel to the floor as she feels very weak.  Pt stated she did not hurt herself on either fall.

## 2015-04-30 NOTE — Telephone Encounter (Signed)
I called the patient. She was hospitalized for the past 10 days with a PE. They did an EEG while she was in the hospital and was told that it was abnormal. She would like to talk about this with Dr. Jannifer Franklin and find out how soon she should follow up with him. I advised her that Dr. Jannifer Franklin is out of the office until Monday and she said he could call her back then.

## 2015-05-04 ENCOUNTER — Emergency Department (HOSPITAL_COMMUNITY): Payer: Medicare Other

## 2015-05-04 ENCOUNTER — Telehealth: Payer: Self-pay | Admitting: Interventional Cardiology

## 2015-05-04 ENCOUNTER — Inpatient Hospital Stay (HOSPITAL_COMMUNITY)
Admission: EM | Admit: 2015-05-04 | Discharge: 2015-05-09 | DRG: 291 | Disposition: A | Discharge status: Home / Self Care | Payer: Medicare Other | Attending: Internal Medicine | Admitting: Internal Medicine

## 2015-05-04 ENCOUNTER — Telehealth: Payer: Self-pay | Admitting: Family Medicine

## 2015-05-04 ENCOUNTER — Encounter (HOSPITAL_COMMUNITY): Payer: Self-pay | Admitting: Emergency Medicine

## 2015-05-04 DIAGNOSIS — I2692 Saddle embolus of pulmonary artery without acute cor pulmonale: Secondary | ICD-10-CM | POA: Diagnosis not present

## 2015-05-04 DIAGNOSIS — Z6835 Body mass index (BMI) 35.0-35.9, adult: Secondary | ICD-10-CM

## 2015-05-04 DIAGNOSIS — I428 Other cardiomyopathies: Secondary | ICD-10-CM

## 2015-05-04 DIAGNOSIS — IMO0001 Reserved for inherently not codable concepts without codable children: Secondary | ICD-10-CM | POA: Diagnosis present

## 2015-05-04 DIAGNOSIS — I82402 Acute embolism and thrombosis of unspecified deep veins of left lower extremity: Secondary | ICD-10-CM | POA: Diagnosis not present

## 2015-05-04 DIAGNOSIS — I2609 Other pulmonary embolism with acute cor pulmonale: Secondary | ICD-10-CM | POA: Diagnosis present

## 2015-05-04 DIAGNOSIS — I420 Dilated cardiomyopathy: Secondary | ICD-10-CM | POA: Diagnosis not present

## 2015-05-04 DIAGNOSIS — Z79899 Other long term (current) drug therapy: Secondary | ICD-10-CM | POA: Diagnosis not present

## 2015-05-04 DIAGNOSIS — E669 Obesity, unspecified: Secondary | ICD-10-CM | POA: Diagnosis not present

## 2015-05-04 DIAGNOSIS — I959 Hypotension, unspecified: Secondary | ICD-10-CM | POA: Diagnosis present

## 2015-05-04 DIAGNOSIS — Z853 Personal history of malignant neoplasm of breast: Secondary | ICD-10-CM | POA: Diagnosis not present

## 2015-05-04 DIAGNOSIS — I129 Hypertensive chronic kidney disease with stage 1 through stage 4 chronic kidney disease, or unspecified chronic kidney disease: Secondary | ICD-10-CM | POA: Diagnosis present

## 2015-05-04 DIAGNOSIS — Z981 Arthrodesis status: Secondary | ICD-10-CM

## 2015-05-04 DIAGNOSIS — Z9114 Patient's other noncompliance with medication regimen: Secondary | ICD-10-CM | POA: Diagnosis not present

## 2015-05-04 DIAGNOSIS — Z66 Do not resuscitate: Secondary | ICD-10-CM | POA: Diagnosis not present

## 2015-05-04 DIAGNOSIS — E876 Hypokalemia: Secondary | ICD-10-CM | POA: Diagnosis present

## 2015-05-04 DIAGNOSIS — N179 Acute kidney failure, unspecified: Secondary | ICD-10-CM | POA: Diagnosis not present

## 2015-05-04 DIAGNOSIS — I82412 Acute embolism and thrombosis of left femoral vein: Secondary | ICD-10-CM | POA: Diagnosis not present

## 2015-05-04 DIAGNOSIS — I5020 Unspecified systolic (congestive) heart failure: Secondary | ICD-10-CM | POA: Diagnosis present

## 2015-05-04 DIAGNOSIS — N183 Chronic kidney disease, stage 3 (moderate): Secondary | ICD-10-CM | POA: Diagnosis present

## 2015-05-04 DIAGNOSIS — J45909 Unspecified asthma, uncomplicated: Secondary | ICD-10-CM | POA: Diagnosis not present

## 2015-05-04 DIAGNOSIS — I951 Orthostatic hypotension: Secondary | ICD-10-CM | POA: Diagnosis present

## 2015-05-04 DIAGNOSIS — I272 Other secondary pulmonary hypertension: Secondary | ICD-10-CM | POA: Diagnosis present

## 2015-05-04 DIAGNOSIS — I509 Heart failure, unspecified: Secondary | ICD-10-CM | POA: Diagnosis not present

## 2015-05-04 DIAGNOSIS — D649 Anemia, unspecified: Secondary | ICD-10-CM | POA: Diagnosis not present

## 2015-05-04 DIAGNOSIS — I5022 Chronic systolic (congestive) heart failure: Secondary | ICD-10-CM | POA: Diagnosis present

## 2015-05-04 DIAGNOSIS — I82432 Acute embolism and thrombosis of left popliteal vein: Secondary | ICD-10-CM | POA: Diagnosis not present

## 2015-05-04 DIAGNOSIS — I270 Primary pulmonary hypertension: Secondary | ICD-10-CM | POA: Diagnosis not present

## 2015-05-04 DIAGNOSIS — J9811 Atelectasis: Secondary | ICD-10-CM | POA: Diagnosis not present

## 2015-05-04 DIAGNOSIS — I9589 Other hypotension: Secondary | ICD-10-CM | POA: Diagnosis present

## 2015-05-04 DIAGNOSIS — E785 Hyperlipidemia, unspecified: Secondary | ICD-10-CM | POA: Diagnosis not present

## 2015-05-04 DIAGNOSIS — K219 Gastro-esophageal reflux disease without esophagitis: Secondary | ICD-10-CM | POA: Diagnosis not present

## 2015-05-04 DIAGNOSIS — R55 Syncope and collapse: Secondary | ICD-10-CM | POA: Diagnosis present

## 2015-05-04 DIAGNOSIS — I10 Essential (primary) hypertension: Secondary | ICD-10-CM | POA: Diagnosis not present

## 2015-05-04 DIAGNOSIS — I447 Left bundle-branch block, unspecified: Secondary | ICD-10-CM | POA: Diagnosis not present

## 2015-05-04 HISTORY — DX: Chronic systolic (congestive) heart failure: I50.22

## 2015-05-04 LAB — BASIC METABOLIC PANEL
ANION GAP: 10 (ref 5–15)
BUN: 23 mg/dL — ABNORMAL HIGH (ref 6–20)
CHLORIDE: 104 mmol/L (ref 101–111)
CO2: 23 mmol/L (ref 22–32)
Calcium: 9.7 mg/dL (ref 8.9–10.3)
Creatinine, Ser: 1.98 mg/dL — ABNORMAL HIGH (ref 0.44–1.00)
GFR calc Af Amer: 30 mL/min — ABNORMAL LOW (ref >60)
GFR calc non Af Amer: 26 mL/min — ABNORMAL LOW (ref >60)
GLUCOSE: 111 mg/dL — AB (ref 65–99)
POTASSIUM: 3.8 mmol/L (ref 3.5–5.1)
Sodium: 137 mmol/L (ref 135–145)

## 2015-05-04 LAB — CBC
HEMATOCRIT: 39.9 % (ref 36.0–46.0)
HEMOGLOBIN: 13.3 g/dL (ref 12.0–15.0)
MCH: 29 pg (ref 26.0–34.0)
MCHC: 33.3 g/dL (ref 30.0–36.0)
MCV: 86.9 fL (ref 78.0–100.0)
Platelets: 189 10*3/uL (ref 150–400)
RBC: 4.59 MIL/uL (ref 3.87–5.11)
RDW: 14.5 % (ref 11.5–15.5)
WBC: 11.7 10*3/uL — ABNORMAL HIGH (ref 4.0–10.5)

## 2015-05-04 LAB — URINALYSIS, ROUTINE W REFLEX MICROSCOPIC
Bilirubin Urine: NEGATIVE
GLUCOSE, UA: NEGATIVE mg/dL
HGB URINE DIPSTICK: NEGATIVE
Ketones, ur: NEGATIVE mg/dL
LEUKOCYTES UA: NEGATIVE
Nitrite: NEGATIVE
Protein, ur: NEGATIVE mg/dL
SPECIFIC GRAVITY, URINE: 1.016 (ref 1.005–1.030)
Urobilinogen, UA: 0.2 mg/dL (ref 0.0–1.0)
pH: 5 (ref 5.0–8.0)

## 2015-05-04 LAB — I-STAT CG4 LACTIC ACID, ED: LACTIC ACID, VENOUS: 1.62 mmol/L (ref 0.5–2.0)

## 2015-05-04 LAB — I-STAT TROPONIN, ED: Troponin i, poc: 0 ng/mL (ref 0.00–0.08)

## 2015-05-04 LAB — TROPONIN I

## 2015-05-04 LAB — MAGNESIUM: MAGNESIUM: 2.2 mg/dL (ref 1.7–2.4)

## 2015-05-04 MED ORDER — SODIUM CHLORIDE 0.9 % IV SOLN
INTRAVENOUS | Status: AC
  Administered 2015-05-04: 21:00:00 via INTRAVENOUS

## 2015-05-04 MED ORDER — ACETAMINOPHEN 650 MG RE SUPP: 650.0000 mg | Freq: Four times a day (QID) | RECTAL | Status: DC | PRN

## 2015-05-04 MED ORDER — ONDANSETRON HCL 4 MG PO TABS
4.0000 mg | ORAL_TABLET | Freq: Four times a day (QID) | ORAL | Status: DC | PRN
Start: 2015-05-04 — End: 2015-05-09

## 2015-05-04 MED ORDER — GABAPENTIN 300 MG PO CAPS
600.0000 mg | ORAL_CAPSULE | Freq: Three times a day (TID) | ORAL | Status: DC
  Administered 2015-05-04 – 2015-05-09 (×14): 600 mg via ORAL
  Filled 2015-05-04 (×3): qty 2
  Filled 2015-05-04: qty 6
  Filled 2015-05-04 (×10): qty 2

## 2015-05-04 MED ORDER — SODIUM CHLORIDE 0.9 % IV BOLUS (SEPSIS)
1000.0000 mL | Freq: Once | INTRAVENOUS | Status: AC
Start: 2015-05-04 — End: 2015-05-04
  Administered 2015-05-04: 1000 mL via INTRAVENOUS

## 2015-05-04 MED ORDER — AMITRIPTYLINE HCL 50 MG PO TABS
50.0000 mg | ORAL_TABLET | Freq: Every day | ORAL | Status: DC
  Administered 2015-05-04 – 2015-05-08 (×5): 50 mg via ORAL
  Filled 2015-05-04 (×5): qty 1

## 2015-05-04 MED ORDER — HYDROCODONE-ACETAMINOPHEN 5-325 MG PO TABS
1.0000 | ORAL_TABLET | ORAL | Status: DC | PRN
  Administered 2015-05-05 – 2015-05-09 (×13): 2 via ORAL
  Filled 2015-05-04 (×13): qty 2

## 2015-05-04 MED ORDER — ATORVASTATIN CALCIUM 40 MG PO TABS
40.0000 mg | ORAL_TABLET | Freq: Every day | ORAL | Status: DC
  Administered 2015-05-05 – 2015-05-08 (×4): 40 mg via ORAL
  Filled 2015-05-04: qty 1
  Filled 2015-05-04: qty 2
  Filled 2015-05-04 (×3): qty 1

## 2015-05-04 MED ORDER — HEPARIN (PORCINE) IN NACL 100-0.45 UNIT/ML-% IJ SOLN
900.0000 [IU]/h | INTRAMUSCULAR | Status: DC
  Administered 2015-05-04: 1200 [IU]/h via INTRAVENOUS
  Filled 2015-05-04: qty 250

## 2015-05-04 MED ORDER — ACETAMINOPHEN 325 MG PO TABS
650.0000 mg | ORAL_TABLET | Freq: Four times a day (QID) | ORAL | Status: DC | PRN
  Administered 2015-05-05 – 2015-05-07 (×2): 650 mg via ORAL
  Filled 2015-05-04 (×2): qty 2

## 2015-05-04 MED ORDER — SODIUM CHLORIDE 0.9 % IJ SOLN
3.0000 mL | Freq: Two times a day (BID) | INTRAMUSCULAR | Status: DC
  Administered 2015-05-05 – 2015-05-09 (×8): 3 mL via INTRAVENOUS

## 2015-05-04 MED ORDER — ALBUTEROL SULFATE (2.5 MG/3ML) 0.083% IN NEBU: 2.5000 mg | INHALATION_SOLUTION | RESPIRATORY_TRACT | Status: DC | PRN

## 2015-05-04 MED ORDER — HEPARIN BOLUS VIA INFUSION
4000.0000 [IU] | Freq: Once | INTRAVENOUS | Status: AC
  Administered 2015-05-04: 4000 [IU] via INTRAVENOUS
  Filled 2015-05-04: qty 4000

## 2015-05-04 MED ORDER — TIZANIDINE HCL 2 MG PO TABS
2.0000 mg | ORAL_TABLET | Freq: Every evening | ORAL | Status: DC | PRN
  Administered 2015-05-05 – 2015-05-08 (×3): 2 mg via ORAL
  Filled 2015-05-04 (×4): qty 1

## 2015-05-04 MED ORDER — TOPIRAMATE 100 MG PO TABS
200.0000 mg | ORAL_TABLET | Freq: Every day | ORAL | Status: DC
  Administered 2015-05-04 – 2015-05-08 (×5): 200 mg via ORAL
  Filled 2015-05-04 (×6): qty 2

## 2015-05-04 MED ORDER — PANTOPRAZOLE SODIUM 40 MG PO TBEC
40.0000 mg | DELAYED_RELEASE_TABLET | Freq: Every day | ORAL | Status: DC
  Administered 2015-05-05 – 2015-05-09 (×5): 40 mg via ORAL
  Filled 2015-05-04 (×5): qty 1

## 2015-05-04 MED ORDER — FLUTICASONE PROPIONATE 50 MCG/ACT NA SUSP
1.0000 | Freq: Every day | NASAL | Status: DC | PRN
  Administered 2015-05-07: 1 via NASAL
  Filled 2015-05-04: qty 16

## 2015-05-04 MED ORDER — ONDANSETRON HCL 4 MG/2ML IJ SOLN: 4.0000 mg | Freq: Four times a day (QID) | INTRAMUSCULAR | Status: DC | PRN

## 2015-05-04 MED ORDER — ALBUTEROL SULFATE HFA 108 (90 BASE) MCG/ACT IN AERS: 2.0000 | INHALATION_SPRAY | Freq: Four times a day (QID) | RESPIRATORY_TRACT | Status: DC | PRN

## 2015-05-04 MED ORDER — ALBUTEROL SULFATE (5 MG/ML) 0.5% IN NEBU: 2.5000 mg | INHALATION_SOLUTION | RESPIRATORY_TRACT | Status: DC | PRN

## 2015-05-04 MED ORDER — LEVALBUTEROL TARTRATE 45 MCG/ACT IN AERO: 1.0000 | INHALATION_SPRAY | RESPIRATORY_TRACT | Status: DC | PRN

## 2015-05-04 NOTE — Telephone Encounter (Signed)
New Message    Pt is calling to report that she has passed out 3 times and that she needs to know if she can   Be seen today or if she needs to got to the ER  Pt c/o Syncope: STAT if syncope occurred within 30 minutes and pt complains of lightheadedness High Priority if episode of passing out, completely, today or in last 24 hours   1. Did you pass out today? Pt is scared and she is scared to get up and move around, pt is on the phone crying   2. When is the last time you passed out? 8/28  11:00PM  3. Has this occurred multiple times? YES  4. Did you have any symptoms prior to passing out? She crawled into her bed last light because she kept passing out

## 2015-05-04 NOTE — H&P (Signed)
PCP: Golden Pop, MD  Daneen Schick  Referring provider Renne Musca   Chief Complaint:  syncope  HPI: Patricia Fischer is a 59 y.o. female   has a past medical history of Essential hypertension; Asthma; Environmental allergies; Pneumonia (2012); Blood transfusion (1979); GERD (gastroesophageal reflux disease); Depression; Breast cancer, left breast (2011); Head injury (2011); Anxiety; Hemorrhoids; Obese; and Chronic migraine without aura, with intractable migraine, so stated, with status migrainosus (March 2016).   Presented with  recurrent episodes of syncope as soon as she gets up from sitting or laying position. Reports occasional chest pain. No shortness of breath. She has been able to ambulate well.  She reports poor appetite but forces herself to eat.  IN ER she was noted to be significantly orthostatic. Cr was noted to be up from baseline of 0.9 on discharge to 1.98 Recently admitted for saddle PE with large left femoral, popliteal  DVT She was discharged on 8/24 unfortunately she was not able to fill her Xarelto to prescription and has been off of any anticoagulation since her discharge. During her hospitalization echogram was done showing EF of 25-30 percent with diffuse hypokinesis with dilated right ventricle. Prior to her discharge she was started on Coreg Sparonolactone and change from Hyzaar to Cozaar Of note she has hx of Breast cancer in 2011 sp lumpectomy she did not ned chemotherapy. No prior blood clots.   Hospitalist was called for admission for orthostatic hypotension in the setting of recent massive PE and Cardiogenic shock.   Review of Systems:    Pertinent positives include: dizziness, syncope, chest pain,   Constitutional:  No weight loss, night sweats, Fevers, chills, fatigue, weight loss  HEENT:  No headaches, Difficulty swallowing,Tooth/dental problems,Sore throat,  No sneezing, itching, ear ache, nasal congestion, post nasal drip,  Cardio-vascular:    No Orthopnea, PND, anasarca,  Palpitations. no Bilateral lower extremity swelling  GI:  No heartburn, indigestion, abdominal pain, nausea, vomiting, diarrhea, change in bowel habits, loss of appetite, melena, blood in stool, hematemesis Resp:  no shortness of breath at rest. No dyspnea on exertion, No excess mucus, no productive cough, No non-productive cough, No coughing up of blood.No change in color of mucus.No wheezing. Skin:  no rash or lesions. No jaundice GU:  no dysuria, change in color of urine, no urgency or frequency. No straining to urinate.  No flank pain.  Musculoskeletal:  No joint pain or no joint swelling. No decreased range of motion. No back pain.  Psych:  No change in mood or affect. No depression or anxiety. No memory loss.  Neuro: no localizing neurological complaints, no tingling, no weakness, no double vision, no gait abnormality, no slurred speech, no confusion  Otherwise ROS are negative except for above, 10 systems were reviewed  Past Medical History: Past Medical History  Diagnosis Date  . Essential hypertension   . Asthma   . Environmental allergies   . Pneumonia 2012  . Blood transfusion [place]  . GERD (gastroesophageal reflux disease)   . Depression   . Breast cancer, left breast 2011    DCIS  . Head injury 2011  . Anxiety   . Hemorrhoids   . Obese   . Chronic migraine without aura, with intractable migraine, so stated, with status migrainosus March 2016   Past Surgical History  Procedure Laterality Date  . Breast surgery Left 2011    Breast lumpectomy  . Cardiac catheterization  2010    [place]; pt  states it was "clear"  . Tonsillectomy    . Appendectomy    . Cholecystectomy    . Tubal ligation    . Anal fissure repair    . Knee cartilage surgery Left   . Carpal tunnel release Bilateral   . Tarsal tunnel release Left 1990  . Anterior cervical decomp/discectomy fusion  01/23/2012    Procedure: ANTERIOR CERVICAL  DECOMPRESSION/DISCECTOMY FUSION 1 LEVEL;  Surgeon: Elaina Hoops, MD;  Location: [facility];  Service: Neurosurgery;  Laterality: N/A;  Cervical five-six anterior cervical discectomy fusion with plating  . Cesarean section  1982, 1979  . Abdominal hysterectomy  1986  . Colonoscopy  01/16/2009    Dr Bary Castilla  . Shoulder arthroscopy with subacromial decompression, rotator cuff repair and bicep tendon repair Right 11/22/2013    Procedure: RIGHT SHOULDER ATHROSCOPY OPEN SUBSCAP REPAIR DELTA-PECTORAL ;  Surgeon: Augustin Schooling, MD;  Location: [place] [facility];  Service: Orthopedics;  Laterality: Right;     Medications: Prior to Admission medications   Medication Sig Start Date End Date Taking? Authorizing Provider  albuterol (PROVENTIL HFA;VENTOLIN HFA) 108 (90 BASE) MCG/ACT inhaler Inhale 2 puffs into the lungs every 6 (six) hours as needed for shortness of breath.    Yes Historical Provider, MD  amitriptyline (ELAVIL) 25 MG tablet take 2 tablets at night Patient taking differently: Take 50 mg by mouth at bedtime.  01/21/15  Yes Kathrynn Ducking, MD  atorvastatin (LIPITOR) 40 MG tablet Take 1 tablet (40 mg total) by mouth daily at 6 PM. 04/29/15  Yes Almyra Deforest, PA  carvedilol (COREG) 6.25 MG tablet Take 1 tablet (6.25 mg total) by mouth 2 (two) times daily with a meal. 04/29/15  Yes Almyra Deforest, PA  fluticasone (FLONASE) 50 MCG/ACT nasal spray Place 1 spray into both nostrils daily as needed for allergies or rhinitis.  07/04/14  Yes Historical Provider, MD  gabapentin (NEURONTIN) 300 MG capsule Take 600 mg by mouth 3 (three) times daily.    Yes Historical Provider, MD  indomethacin (INDOCIN) 50 MG capsule Take 1 capsule (50 mg total) by mouth 3 (three) times daily with meals. Patient taking differently: Take 50 mg by mouth 3 (three) times daily as needed for mild pain or moderate pain.  02/25/15  Yes Kathrynn Ducking, MD  levalbuterol [facility] HFA) 45 MCG/ACT inhaler Inhale 1 puff into the lungs every 4 (four) hours as  needed for wheezing.   Yes Historical Provider, MD  losartan (COZAAR) 25 MG tablet Take 1 tablet (25 mg total) by mouth daily. 04/29/15  Yes Almyra Deforest, PA  ondansetron (ZOFRAN ODT) 4 MG disintegrating tablet Take 1 tablet (4 mg total) by mouth every 8 (eight) hours as needed for nausea or vomiting. 03/18/15  Yes Kathrynn Ducking, MD  pantoprazole (PROTONIX) 40 MG tablet Take 1 tablet (40 mg total) by mouth daily at 6 (six) AM. 04/29/15  Yes Almyra Deforest, PA  rivaroxaban (XARELTO) 20 MG TABS tablet Take 1 tablet (20 mg total) by mouth daily with supper. To start after finished with start pack 04/29/15  Yes Almyra Deforest, PA  spironolactone (ALDACTONE) 25 MG tablet Take 1 tablet (25 mg total) by mouth daily. 04/29/15  Yes Almyra Deforest, PA  tiZANidine (ZANAFLEX) 2 MG tablet Take 2 mg by mouth at bedtime as needed for muscle spasms.  03/24/14  Yes Historical Provider, MD  topiramate (TOPAMAX) 100 MG tablet TAKE 1 TABLET (100 MG TOTAL) BY MOUTH AT BEDTIME. Patient taking differently: Take 200  mg by mouth at bedtime.  10/03/14  Yes Ward Givens, NP    Allergies:   Allergies  Allergen Reactions  . Gadolinium Derivatives Shortness Of Breath and Nausea And Vomiting  . Sulfa Antibiotics Swelling    "Ears swelled up like Dumbo"  . Aripiprazole     Dry mouth with sores   . Codeine Itching and Rash  . Oxycodone Itching  . Penicillins Hives    Social History:  Ambulatory   independently   Lives at home alone,         reports that she has never smoked. She has never used smokeless tobacco. She reports that she does not drink alcohol or use illicit drugs.    Family History: family history includes Breast cancer in her mother and paternal aunt; Migraines in her mother; Rectal cancer in her father. There is no history of Anesthesia problems.    Physical Exam: Patient Vitals for the past 24 hrs:  BP Temp Temp src Pulse Resp SpO2 Height Weight  05/04/15 1742 90/67 mmHg - - (!) 125 - - - -  05/04/15 1742 114/80  mmHg - - 105 - - - -  05/04/15 1741 114/88 mmHg - - 95 - - - -  05/04/15 1714 114/88 mmHg - - 92 18 99 % - -  05/04/15 1501 124/75 mmHg - - 106 20 99 % - -  05/04/15 1242 114/76 mmHg 99.2 F (37.3 C) Oral 110 18 98 % 5\' 5"  (1.651 m) 97.523 kg (215 lb)    1. General:  in No Acute distress 2. Psychological: Alert and   Oriented 3. Head/ENT:     Dry Mucous Membranes                          Head Non traumatic, neck supple                          Normal  Dentition 4. SKIN:  decreased Skin turgor,  Skin clean Dry and intact no rash 5. Heart: Regular rate and rhythm no Murmur, Rub or gallop 6. Lungs: Clear to auscultation bilaterally, no wheezes or crackles   7. Abdomen: Soft, non-tender, Non distended 8. Lower extremities: no clubbing, cyanosis, or edema 9. Neurologically Grossly intact, moving all 4 extremities equally 10. MSK: Normal range of motion  body mass index is 35.78 kg/(m^2).   Labs on Admission:   Results for orders placed or performed during the hospital encounter of 05/04/15 (from the past 24 hour(s))  Basic metabolic panel     Status: Abnormal   Collection Time: 05/04/15  1:18 PM  Result Value Ref Range   Sodium 137 135 - 145 mmol/L   Potassium 3.8 3.5 - 5.1 mmol/L   Chloride 104 101 - 111 mmol/L   CO2 23 22 - 32 mmol/L   Glucose, Bld 111 (H) 65 - 99 mg/dL   BUN 23 (H) 6 - 20 mg/dL   Creatinine, Ser 1.98 (H) 0.44 - 1.00 mg/dL   Calcium 9.7 8.9 - 10.3 mg/dL   GFR calc non Af Amer 26 (L) >60 mL/min   GFR calc Af Amer 30 (L) >60 mL/min   Anion gap 10 5 - 15  CBC     Status: Abnormal   Collection Time: 05/04/15  1:18 PM  Result Value Ref Range   WBC 11.7 (H) 4.0 - 10.5 K/uL   RBC 4.59 3.87 - 5.11 MIL/uL  Hemoglobin 13.3 12.0 - 15.0 g/dL   HCT 39.9 36.0 - 46.0 %   MCV 86.9 78.0 - 100.0 fL   MCH 29.0 26.0 - 34.0 pg   MCHC 33.3 30.0 - 36.0 g/dL   RDW 14.5 11.5 - 15.5 %   Platelets 189 150 - 400 K/uL  Urinalysis, Routine w reflex microscopic (not at [facility])      Status: None   Collection Time: 05/04/15  5:45 PM  Result Value Ref Range   Color, Urine YELLOW YELLOW   APPearance CLEAR CLEAR   Specific Gravity, Urine 1.016 1.005 - 1.030   pH 5.0 5.0 - 8.0   Glucose, UA NEGATIVE NEGATIVE mg/dL   Hgb urine dipstick NEGATIVE NEGATIVE   Bilirubin Urine NEGATIVE NEGATIVE   Ketones, ur NEGATIVE NEGATIVE mg/dL   Protein, ur NEGATIVE NEGATIVE mg/dL   Urobilinogen, UA 0.2 0.0 - 1.0 mg/dL   Nitrite NEGATIVE NEGATIVE   Leukocytes, UA NEGATIVE NEGATIVE  I-stat troponin, ED     Status: None   Collection Time: 05/04/15  6:29 PM  Result Value Ref Range   Troponin i, poc 0.00 0.00 - 0.08 ng/mL   Comment 3          I-Stat CG4 Lactic Acid, ED     Status: None   Collection Time: 05/04/15  6:31 PM  Result Value Ref Range   Lactic Acid, Venous 1.62 0.5 - 2.0 mmol/L    UA no evidence of UTI  No results found for: HGBA1C  Estimated Creatinine Clearance: 34.9 mL/min (by C-G formula based on Cr of 1.98).  BNP (last 3 results) No results for input(s): PROBNP in the last 8760 hours.  Other results:  I have pearsonaly reviewed this: ECG REPORT  Rate118  Rhythm: LBBB  ST&T Change: n/a   Filed Weights   05/04/15 1242  Weight: 97.523 kg (215 lb)     Cultures: No results found for: SDES, SPECREQUEST, CULT, REPTSTATUS   Radiological Exams on Admission: No results found.  Chart has been reviewed  Family not  at  Bedside    Assessment/Plan 59 year old female with history of  Hypertension and recently diagnosed left lower extremity DVT as well as saddle PE with new diagnosis of dilated cardiomyopathy EF of 25-30 percent presents with acute renal failure and evidence of orthostasis associated syncopal episodes   Present on Admission:    . Saddle embolus of pulmonary artery without acute cor pulmonale - will start heparin IV. Currently appears to be hemodynamically stable while laying at rest. Monitor and step down. Notified [facility] M regarding the  patient  . Syncope and collapse - per history more consistent with orthostasis induced syncope but given reduced EF won't need father workup. Discussed with cardiology. We will see the patient. For now hold off on spironolactone. Monitor on telemetry for dysrhythmias. Admit to step down. Cycle cardiac enzymes  . Hypotension - most likely secondary to orthostasis patient appeared in normotensive when she is laying down. We'll rehydrate to improve preload. Being careful given   patient was recently diagnosed with systolic heart failure with reduced EF.  Acute renal failure patient appears to be prerenal will give gentle IV fluids and follow creatinine,  obtain urine electrolytes. If no improvement will need to further evaluation including renal Doppler . Essential hypertension - given recent massive PE and hypotension will hold off on Coreg, spironolactone and Cozaar for now . LBBB (left bundle branch block)-rate related  - unchanged from prior continue to monitor on telemetry  Prophylaxis: heparin   CODE STATUS:   DNR/DNI as per patient   Disposition: To home once workup is complete and patient is stable  Other plan as per orders.  I have spent a total of  75 min on this admission extra time spent discussing case with cardiology and PCCM   Rose Bud 05/04/2015, 6:53 PM  [facility]  Pager [phone]   after 2 AM please page floor coverage PA If 7AM-7PM, please contact the day team taking care of the patient  [email or web address]  Password [email or web address]

## 2015-05-04 NOTE — Care Management (Signed)
Patient recently discharge from hospital 8/24 with PE on Xarelto. ED CM met with patient at bedside, patient states she did not fill prescription because she was unaware that prescription was in discharge paperwork, patient did not notify hospital regarding not having prescription. Patient also, states, that she did not receive a free 30 day trial card, note from CM reflected patient being provided with the 30 free Xarelto card.  CM will monitor for discharge plan to assist with a medication savings card.

## 2015-05-04 NOTE — ED Notes (Signed)
Patient given gingerale per RES

## 2015-05-04 NOTE — Progress Notes (Addendum)
ANTICOAGULATION CONSULT NOTE - Initial Consult  Pharmacy Consult for heparin Indication: pulmonary embolus  Allergies  Allergen Reactions  . Gadolinium Derivatives Shortness Of Breath and Nausea And Vomiting  . Sulfa Antibiotics Swelling    "Ears swelled up like Dumbo"  . Aripiprazole     Dry mouth with sores   . Codeine Itching and Rash  . Oxycodone Itching  . Penicillins Hives    Patient Measurements: Height: 5\' 5"  (165.1 cm) Weight: 209 lb 10.5 oz (95.1 kg) IBW/kg (Calculated) : 57 Heparin Dosing Weight: 78.2 kg   Vital Signs: Temp: 97.8 F (36.6 C) (08/29 1947) Temp Source: Oral (08/29 1947) BP: 116/82 mmHg (08/29 1947) Pulse Rate: 77 (08/29 1947)  Labs:  Recent Labs  05/04/15 1318  HGB 13.3  HCT 39.9  PLT 189  CREATININE 1.98*    Estimated Creatinine Clearance: 34.4 mL/min (by C-G formula based on Cr of 1.98).   Medical History: Past Medical History  Diagnosis Date  . Essential hypertension   . Asthma   . Environmental allergies   . Pneumonia 2012  . Blood transfusion [place]  . GERD (gastroesophageal reflux disease)   . Depression   . Breast cancer, left breast 2011    DCIS  . Head injury 2011  . Anxiety   . Hemorrhoids   . Obese   . Chronic migraine without aura, with intractable migraine, so stated, with status migrainosus March 2016    Medications:  Prescriptions prior to admission  Medication Sig Dispense Refill Last Dose  . albuterol (PROVENTIL HFA;VENTOLIN HFA) 108 (90 BASE) MCG/ACT inhaler Inhale 2 puffs into the lungs every 6 (six) hours as needed for shortness of breath.    PRN  . amitriptyline (ELAVIL) 25 MG tablet take 2 tablets at night (Patient taking differently: Take 50 mg by mouth at bedtime. ) 180 tablet 3 05/03/2015 at Unknown time  . atorvastatin (LIPITOR) 40 MG tablet Take 1 tablet (40 mg total) by mouth daily at 6 PM. 30 tablet 5 05/04/2015 at Unknown time  . carvedilol (COREG) 6.25 MG tablet Take 1 tablet (6.25  mg total) by mouth 2 (two) times daily with a meal. 60 tablet 5 05/04/2015 at Unknown time  . fluticasone (FLONASE) 50 MCG/ACT nasal spray Place 1 spray into both nostrils daily as needed for allergies or rhinitis.   11 PRN  . gabapentin (NEURONTIN) 300 MG capsule Take 600 mg by mouth 3 (three) times daily.    05/04/2015 at Unknown time  . indomethacin (INDOCIN) 50 MG capsule Take 1 capsule (50 mg total) by mouth 3 (three) times daily with meals. (Patient taking differently: Take 50 mg by mouth 3 (three) times daily as needed for mild pain or moderate pain. ) 90 capsule 1 PRN  . levalbuterol (XOPENEX HFA) 45 MCG/ACT inhaler Inhale 1 puff into the lungs every 4 (four) hours as needed for wheezing.   PRN  . losartan (COZAAR) 25 MG tablet Take 1 tablet (25 mg total) by mouth daily. 30 tablet 5 05/04/2015 at Unknown time  . ondansetron (ZOFRAN ODT) 4 MG disintegrating tablet Take 1 tablet (4 mg total) by mouth every 8 (eight) hours as needed for nausea or vomiting. 30 tablet 2 PRN  . pantoprazole (PROTONIX) 40 MG tablet Take 1 tablet (40 mg total) by mouth daily at 6 (six) AM. 30 tablet 5 05/04/2015 at Unknown time  . rivaroxaban (XARELTO) 20 MG TABS tablet Take 1 tablet (20 mg total) by mouth daily with  supper. To start after finished with start pack 30 tablet 11 05/04/2015 at Unknown time  . spironolactone (ALDACTONE) 25 MG tablet Take 1 tablet (25 mg total) by mouth daily. 30 tablet 5 05/04/2015 at Unknown time  . tiZANidine (ZANAFLEX) 2 MG tablet Take 2 mg by mouth at bedtime as needed for muscle spasms.    05/03/2015 at Unknown time  . topiramate (TOPAMAX) 100 MG tablet TAKE 1 TABLET (100 MG TOTAL) BY MOUTH AT BEDTIME. (Patient taking differently: Take 200 mg by mouth at bedtime. ) 90 tablet 3 05/03/2015 at Unknown time    Assessment: 59 yo female admitted with syncope. Pt was admitted 2 weeks ago with PE with right heart strain. She was discharged (on 04/29/15) on Xarelto but never filled the prescription;  she has not taken any anticoagulants since last week when she was hospitalized. Pt has AKI, SCr 1 > 1.98, CBC is stable.    Goal of Therapy:  Heparin level 0.3-0.7 units/ml Monitor platelets by anticoagulation protocol: Yes    Plan:  -Heparin bolus 4000 units x1 then 1200 units/hr -Daily HL, CBC -Monitor s/sx bleeding -Check level with am labs -Consider reevaluating best anticoagulant for this patient, incase she did not fill medication due to cost -If she goes back on Xarelto would re-load her with 21 days of high dose Xarelto    Hughes Better, PharmD, BCPS Clinical Pharmacist Pager: [phone] 05/04/2015 8:05 PM

## 2015-05-04 NOTE — ED Notes (Signed)
Patricia Fischer, case Freight forwarder, at bedside

## 2015-05-04 NOTE — ED Notes (Addendum)
Patient states that she was released from the hospital on Wednesday for blood clots, states since her discharge she has not been able to fill xarelto. Patient states that last night she had 6 episodes where she passed out. States she just goes to the ground all of sudden and it lasts about 5-10 seconds. Patient denies any chest pain or sob at rest. Patient states however when she is walking she does get sob.

## 2015-05-04 NOTE — Telephone Encounter (Signed)
Calling stating that she passed out 6 x last night.  States she is dizzy and when stood up would pass out "hard" and fell to floor.  States she weights 215 lbs and full weight to floor .Did not want to go to ER.  Was d/c last Wed 8/24 from embolus of pulmonary artery and has been dizzy ever since.  States BP at d/c was 87/67 but doesn't have BP cuff to take BP at home.  Has taken all of meds except one which she is suppose to pick up today.  She doesn't know the name of it but upon further questioning she states it is the one that she was suppose to take at one dose and the increase dosage.  Appears to be Xarelto.  Spoke w/Luke Dyann Kief who suggests that she call 911 and go to ER now.  Advised pt to call 911 but she has called her daughter in law and will be there in 15 min and refuses to call 911.  Notified Trish of pt going to ER.

## 2015-05-04 NOTE — ED Notes (Signed)
Pt sts recently hospitalized for PE and had syncope x 6 last night; pt sts no blood thinners since Wednesday but takes xerelto

## 2015-05-04 NOTE — Progress Notes (Signed)
Unit CM UR Completed by [facility] ED CM  W. Milon Dethloff RN  

## 2015-05-04 NOTE — ED Provider Notes (Signed)
CSN: [id number]     Arrival date & time 05/04/15  1158 History   First MD Initiated Contact with Patient 05/04/15 1720     Chief Complaint  Patient presents with  . Loss of Consciousness     (Consider location/radiation/quality/duration/timing/severity/associated sxs/prior Treatment) Patient is a 59 y.o. female presenting with syncope.  Loss of Consciousness Episode history:  Multiple Most recent episode:  Today Duration:  5 seconds Timing:  Constant Progression:  Resolved Chronicity:  New Context: standing up   Witnessed: no   Relieved by:  Lying down Worsened by:  Posture Ineffective treatments:  None tried Associated symptoms: chest pain and shortness of breath   Associated symptoms: no fever, no headaches, no nausea and no vomiting   Risk factors: no seizures     Past Medical History  Diagnosis Date  . Essential hypertension   . Asthma   . Environmental allergies   . Pneumonia 2012  . Blood transfusion [place]  . GERD (gastroesophageal reflux disease)   . Depression   . Breast cancer, left breast 2011    DCIS  . Head injury 2011  . Anxiety   . Hemorrhoids   . Obese   . Chronic migraine without aura, with intractable migraine, so stated, with status migrainosus March 2016   Past Surgical History  Procedure Laterality Date  . Breast surgery Left 2011    Breast lumpectomy  . Cardiac catheterization  2010    [facility]; pt states it was "clear"  . Tonsillectomy    . Appendectomy    . Cholecystectomy    . Tubal ligation    . Anal fissure repair    . Knee cartilage surgery Left   . Carpal tunnel release Bilateral   . Tarsal tunnel release Left 1990  . Anterior cervical decomp/discectomy fusion  01/23/2012    Procedure: ANTERIOR CERVICAL DECOMPRESSION/DISCECTOMY FUSION 1 LEVEL;  Surgeon: Elaina Hoops, MD;  Location: [place] [facility];  Service: Neurosurgery;  Laterality: N/A;  Cervical five-six anterior cervical discectomy fusion with plating  .  Cesarean section  1982, 1979  . Abdominal hysterectomy  1986  . Colonoscopy  01/16/2009    Dr Bary Castilla  . Shoulder arthroscopy with subacromial decompression, rotator cuff repair and bicep tendon repair Right 11/22/2013    Procedure: RIGHT SHOULDER ATHROSCOPY OPEN SUBSCAP REPAIR DELTA-PECTORAL ;  Surgeon: Augustin Schooling, MD;  Location: [place];  Service: Orthopedics;  Laterality: Right;   Family History  Problem Relation Age of Onset  . Anesthesia problems Neg Hx   . Breast cancer Paternal Aunt   . Breast cancer Mother   . Migraines Mother   . Rectal cancer Father    Social History  Substance Use Topics  . Smoking status: Never Smoker   . Smokeless tobacco: Never Used  . Alcohol Use: No     Comment: occasional wine   OB History    No data available     Review of Systems  Constitutional: Negative for fever and chills.  HENT: Negative for congestion and sore throat.   Eyes: Negative for visual disturbance.  Respiratory: Positive for shortness of breath. Negative for cough and wheezing.   Cardiovascular: Positive for chest pain and syncope.  Gastrointestinal: Negative for nausea, vomiting, abdominal pain, diarrhea and constipation.  Genitourinary: Negative for dysuria, difficulty urinating and vaginal pain.  Musculoskeletal: Negative for myalgias and arthralgias.  Skin: Negative for rash.  Neurological: Positive for syncope and light-headedness. Negative for headaches.  Psychiatric/Behavioral:  Negative for behavioral problems.  All other systems reviewed and are negative.     Allergies  Gadolinium derivatives; Sulfa antibiotics; Aripiprazole; Codeine; Oxycodone; and Penicillins  Home Medications   Prior to Admission medications   Medication Sig Start Date End Date Taking? Authorizing Provider  albuterol (PROVENTIL HFA;VENTOLIN HFA) 108 (90 BASE) MCG/ACT inhaler Inhale 2 puffs into the lungs every 6 (six) hours as needed for shortness of breath.    Yes Historical Provider,  MD  amitriptyline (ELAVIL) 25 MG tablet take 2 tablets at night Patient taking differently: Take 50 mg by mouth at bedtime.  01/21/15  Yes Kathrynn Ducking, MD  atorvastatin (LIPITOR) 40 MG tablet Take 1 tablet (40 mg total) by mouth daily at 6 PM. 04/29/15  Yes Almyra Deforest, PA  carvedilol (COREG) 6.25 MG tablet Take 1 tablet (6.25 mg total) by mouth 2 (two) times daily with a meal. 04/29/15  Yes Almyra Deforest, PA  fluticasone (FLONASE) 50 MCG/ACT nasal spray Place 1 spray into both nostrils daily as needed for allergies or rhinitis.  07/04/14  Yes Historical Provider, MD  gabapentin (NEURONTIN) 300 MG capsule Take 600 mg by mouth 3 (three) times daily.    Yes Historical Provider, MD  indomethacin (INDOCIN) 50 MG capsule Take 1 capsule (50 mg total) by mouth 3 (three) times daily with meals. Patient taking differently: Take 50 mg by mouth 3 (three) times daily as needed for mild pain or moderate pain.  02/25/15  Yes Kathrynn Ducking, MD  levalbuterol [facility] HFA) 45 MCG/ACT inhaler Inhale 1 puff into the lungs every 4 (four) hours as needed for wheezing.   Yes Historical Provider, MD  losartan (COZAAR) 25 MG tablet Take 1 tablet (25 mg total) by mouth daily. 04/29/15  Yes Almyra Deforest, PA  ondansetron (ZOFRAN ODT) 4 MG disintegrating tablet Take 1 tablet (4 mg total) by mouth every 8 (eight) hours as needed for nausea or vomiting. 03/18/15  Yes Kathrynn Ducking, MD  pantoprazole (PROTONIX) 40 MG tablet Take 1 tablet (40 mg total) by mouth daily at 6 (six) AM. 04/29/15  Yes Almyra Deforest, PA  rivaroxaban (XARELTO) 20 MG TABS tablet Take 1 tablet (20 mg total) by mouth daily with supper. To start after finished with start pack 04/29/15  Yes Almyra Deforest, PA  spironolactone (ALDACTONE) 25 MG tablet Take 1 tablet (25 mg total) by mouth daily. 04/29/15  Yes Almyra Deforest, PA  tiZANidine (ZANAFLEX) 2 MG tablet Take 2 mg by mouth at bedtime as needed for muscle spasms.  03/24/14  Yes Historical Provider, MD  topiramate (TOPAMAX) 100 MG  tablet TAKE 1 TABLET (100 MG TOTAL) BY MOUTH AT BEDTIME. Patient taking differently: Take 200 mg by mouth at bedtime.  10/03/14  Yes Ward Givens, NP   BP 114/88 mmHg  Pulse 92  Temp(Src) 99.2 F (37.3 C) (Oral)  Resp 18  Ht 5\' 5"  (1.651 m)  Wt 215 lb (97.523 kg)  BMI 35.78 kg/m2  SpO2 99% Physical Exam  Constitutional: She is oriented to person, place, and time. She appears well-developed and well-nourished. No distress.  HENT:  Head: Normocephalic and atraumatic.  Eyes: EOM are normal.  Neck: Normal range of motion.  Cardiovascular: Normal rate, regular rhythm and normal heart sounds.   No murmur heard. Pulmonary/Chest: Effort normal and breath sounds normal. No respiratory distress. She has no wheezes.  Abdominal: Soft. There is no tenderness.  Musculoskeletal: She exhibits no edema.  Neurological: She is alert and oriented to person,  place, and time. She has normal strength and normal reflexes. No cranial nerve deficit or sensory deficit. Coordination normal. GCS eye subscore is 4. GCS verbal subscore is 5. GCS motor subscore is 6.  Skin: She is not diaphoretic.  Psychiatric: She has a normal mood and affect. Her behavior is normal.    ED Course  Procedures (including critical care time) Labs Review Labs Reviewed  BASIC METABOLIC PANEL - Abnormal; Notable for the following:    Glucose, Bld 111 (*)    BUN 23 (*)    Creatinine, Ser 1.98 (*)    GFR calc non Af Amer 26 (*)    GFR calc Af Amer 30 (*)    All other components within normal limits  CBC - Abnormal; Notable for the following:    WBC 11.7 (*)    All other components within normal limits  URINALYSIS, ROUTINE W REFLEX MICROSCOPIC (NOT AT [facility])  MAGNESIUM  TROPONIN I  MAGNESIUM  PHOSPHORUS  TSH  COMPREHENSIVE METABOLIC PANEL  CBC  TROPONIN I  TROPONIN I  SODIUM, URINE, RANDOM  CREATININE, URINE, RANDOM  HEPARIN LEVEL (UNFRACTIONATED)  CBC  I-STAT TROPOININ, ED  I-STAT CG4 LACTIC ACID, ED  I-STAT  TROPOININ, ED  Randolm Idol, ED    Imaging Review Dg Chest Portable 1 View  05/04/2015   CLINICAL DATA:  Multiple syncopal episodes.  EXAM: PORTABLE CHEST - 1 VIEW  COMPARISON:  04/19/2015  FINDINGS: The cardiac silhouette, mediastinal and hilar contours are within normal limits and stable. Low lung volumes with mild vascular crowding and streaky atelectasis but no infiltrates, edema or effusions. The bony thorax is intact.  IMPRESSION: No acute cardiopulmonary findings.   Electronically Signed   By: Marijo Sanes M.D.   On: 05/04/2015 18:52   I have personally reviewed and evaluated these images and lab results as part of my medical decision-making.   EKG Interpretation   Date/Time:  Monday May 04 2015 12:45:05 EDT Ventricular Rate:  118 PR Interval:    QRS Duration: 156 QT Interval:  428 QTC Calculation: 599 R Axis:   -68 Text Interpretation:  Wide QRS rhythm Left axis deviation Non-specific  intra-ventricular conduction block Possible Lateral infarct , age  undetermined Abnormal ECG Left bundle branch block wide complex  tachycardia Confirmed by Kathrynn Humble, MD, Thelma Comp [phone]) on 05/04/2015 6:09:14  PM      MDM   Final diagnoses:  Syncope, unspecified syncope type  Orthostatic hypotension     Patient is a 59 year old female with recent history of saddle PE, systolic heart failure with an EF of 25% that presents with multiple episodes of syncope. Patient states she was discharged 5 days ago with a prescription for Xarelto however did not realize that she had the prescription says she has not been and a platelet for 5 days. Patient states was standing she has chest pain or shortness of breath. Upon arrival to the ED the patient is hemodynamically stable however was standing she is positive orthostatics. Patient's pulse goes from 90-125 and blood pressure goes from 114/88 down to 90/67. Patient almost had a syncopal episode during orthostatic testing. Patient's renal function has  doubled since her discharge. Patient given IV fluid and will be admitted to hospitalist.    Renne Musca, MD 05/05/15 0300  Varney Biles, MD 05/08/15 9233

## 2015-05-04 NOTE — Consult Note (Signed)
Cardiology Consult Referring Physician:  Primary Cardiologist: Dr. Tamala Julian Reason for Consultation: Presyncope/syncope  HPI: Pleasant 59 yo F with PMH as noted below, was recently admitted from 8/14/ to 8/24 after a syncopal episode and found to have saddle PE, left leg DVT, dilated RV with reduced RV systolic function, LVEF 41%, LBBB. She was discharged on Xarelto (she says she never knew about the prescription for that). She reports that before that admission she was having intermittent episode of dizziness for few months. After discharge she kept having dizziness on standing and last night had up to 6 episodes of collapse (she is not sure if she ever lost consciousness). No CP, fever, N/V, diarrhea, bleeding. She does not smoke or drink. She lives by herself. She reports that she had cath about 10 years ago and was told it was ok with no blockage.     At present, she is in bed, awake and alert, no distress, normal BP and hear rate.    Review of Systems:  10 systems reviewed unremarkable except as noted in HPI  Chronic headache  Past Medical History  Diagnosis Date  . Essential hypertension   . Asthma   . Environmental allergies   . Pneumonia 2012  . Blood transfusion [place]  . GERD (gastroesophageal reflux disease)   . Depression   . Breast cancer, left breast 2011    DCIS  . Head injury 2011  . Anxiety   . Hemorrhoids   . Obese   . Chronic migraine without aura, with intractable migraine, so stated, with status migrainosus March 2016    Medications Prior to Admission  Medication Sig Dispense Refill  . albuterol (PROVENTIL HFA;VENTOLIN HFA) 108 (90 BASE) MCG/ACT inhaler Inhale 2 puffs into the lungs every 6 (six) hours as needed for shortness of breath.     Marland Kitchen amitriptyline (ELAVIL) 25 MG tablet take 2 tablets at night (Patient taking differently: Take 50 mg by mouth at bedtime. ) 180 tablet 3  . atorvastatin (LIPITOR) 40 MG tablet Take 1 tablet (40 mg total) by  mouth daily at 6 PM. 30 tablet 5  . carvedilol (COREG) 6.25 MG tablet Take 1 tablet (6.25 mg total) by mouth 2 (two) times daily with a meal. 60 tablet 5  . fluticasone (FLONASE) 50 MCG/ACT nasal spray Place 1 spray into both nostrils daily as needed for allergies or rhinitis.   11  . gabapentin (NEURONTIN) 300 MG capsule Take 600 mg by mouth 3 (three) times daily.     . indomethacin (INDOCIN) 50 MG capsule Take 1 capsule (50 mg total) by mouth 3 (three) times daily with meals. (Patient taking differently: Take 50 mg by mouth 3 (three) times daily as needed for mild pain or moderate pain. ) 90 capsule 1  . levalbuterol (XOPENEX HFA) 45 MCG/ACT inhaler Inhale 1 puff into the lungs every 4 (four) hours as needed for wheezing.    Marland Kitchen losartan (COZAAR) 25 MG tablet Take 1 tablet (25 mg total) by mouth daily. 30 tablet 5  . ondansetron (ZOFRAN ODT) 4 MG disintegrating tablet Take 1 tablet (4 mg total) by mouth every 8 (eight) hours as needed for nausea or vomiting. 30 tablet 2  . pantoprazole (PROTONIX) 40 MG tablet Take 1 tablet (40 mg total) by mouth daily at 6 (six) AM. 30 tablet 5  . rivaroxaban (XARELTO) 20 MG TABS tablet Take 1 tablet (20 mg total) by mouth daily with supper. To start after finished with  start pack 30 tablet 11  . spironolactone (ALDACTONE) 25 MG tablet Take 1 tablet (25 mg total) by mouth daily. 30 tablet 5  . tiZANidine (ZANAFLEX) 2 MG tablet Take 2 mg by mouth at bedtime as needed for muscle spasms.     Marland Kitchen topiramate (TOPAMAX) 100 MG tablet TAKE 1 TABLET (100 MG TOTAL) BY MOUTH AT BEDTIME. (Patient taking differently: Take 200 mg by mouth at bedtime. ) 90 tablet 3     . amitriptyline  50 mg Oral QHS  . [START ON 05/05/2015] atorvastatin  40 mg Oral q1800  . gabapentin  600 mg Oral TID  . heparin  4,000 Units Intravenous Once  . [START ON 05/05/2015] pantoprazole  40 mg Oral Q0600  . sodium chloride  3 mL Intravenous Q12H  . topiramate  200 mg Oral QHS    Infusions: . sodium  chloride    . heparin      Allergies  Allergen Reactions  . Gadolinium Derivatives Shortness Of Breath and Nausea And Vomiting  . Sulfa Antibiotics Swelling    "Ears swelled up like Dumbo"  . Aripiprazole     Dry mouth with sores   . Codeine Itching and Rash  . Oxycodone Itching  . Penicillins Hives    Social History   Social History  . Marital Status: Divorced    Spouse Name: N/A  . Number of Children: 2  . Years of Education: college-1   Occupational History  . Unemployed    Social History Main Topics  . Smoking status: Never Smoker   . Smokeless tobacco: Never Used  . Alcohol Use: No     Comment: occasional wine  . Drug Use: No  . Sexual Activity: No   Other Topics Concern  . Not on file   Social History Narrative    Family History  Problem Relation Age of Onset  . Anesthesia problems Neg Hx   . Breast cancer Paternal Aunt   . Breast cancer Mother   . Migraines Mother   . Rectal cancer Father     PHYSICAL EXAM: Filed Vitals:   05/04/15 1947  BP: 116/82  Pulse: 77  Temp: 97.8 F (36.6 C)  Resp: 23     Intake/Output Summary (Last 24 hours) at 05/04/15 2113 Last data filed at 05/04/15 2000  Gross per 24 hour  Intake   1220 ml  Output      0 ml  Net   1220 ml    General:  Well appearing. No respiratory difficulty at rest;  HEENT: normal Neck: supple. no JVD. Carotids 2+ bilat; no bruits. No lymphadenopathy or thryomegaly appreciated. Cor: PMI nondisplaced. Regular rate & rhythm. No rubs, gallops or murmurs. Lungs: clear Abdomen: soft, nontender, nondistended. No hepatosplenomegaly. No bruits or masses. Good bowel sounds. Extremities: no cyanosis, clubbing, rash, edema Neuro: alert & oriented x 3, cranial nerves grossly intact. moves all 4 extremities w/o difficulty. Affect pleasant.  Echo 04/20/15:  Study Conclusions - Left ventricle: Septal dyssynergy related to BBB and RV abnormalities. The cavity size was normal. Wall thickness  was increased in a pattern of moderate LVH. Systolic function was severely reduced. The estimated ejection fraction was in the range of 25% to 30%. Diffuse hypokinesis. - Right ventricle: The cavity size was moderately to severely dilated. Systolic function was severely reduced. - Right atrium: The atrium was mildly dilated. - Pulmonary arteries: PA peak pressure: 45 mm Hg (S).  ECG: probable sinus rate 118 bpm, LAD, IVCD  Results  for orders placed or performed during the hospital encounter of 05/04/15 (from the past 24 hour(s))  Basic metabolic panel     Status: Abnormal   Collection Time: 05/04/15  1:18 PM  Result Value Ref Range   Sodium 137 135 - 145 mmol/L   Potassium 3.8 3.5 - 5.1 mmol/L   Chloride 104 101 - 111 mmol/L   CO2 23 22 - 32 mmol/L   Glucose, Bld 111 (H) 65 - 99 mg/dL   BUN 23 (H) 6 - 20 mg/dL   Creatinine, Ser 1.98 (H) 0.44 - 1.00 mg/dL   Calcium 9.7 8.9 - 10.3 mg/dL   GFR calc non Af Amer 26 (L) >60 mL/min   GFR calc Af Amer 30 (L) >60 mL/min   Anion gap 10 5 - 15  CBC     Status: Abnormal   Collection Time: 05/04/15  1:18 PM  Result Value Ref Range   WBC 11.7 (H) 4.0 - 10.5 K/uL   RBC 4.59 3.87 - 5.11 MIL/uL   Hemoglobin 13.3 12.0 - 15.0 g/dL   HCT 39.9 36.0 - 46.0 %   MCV 86.9 78.0 - 100.0 fL   MCH 29.0 26.0 - 34.0 pg   MCHC 33.3 30.0 - 36.0 g/dL   RDW 14.5 11.5 - 15.5 %   Platelets 189 150 - 400 K/uL  Urinalysis, Routine w reflex microscopic (not at [facility])     Status: None   Collection Time: 05/04/15  5:45 PM  Result Value Ref Range   Color, Urine YELLOW YELLOW   APPearance CLEAR CLEAR   Specific Gravity, Urine 1.016 1.005 - 1.030   pH 5.0 5.0 - 8.0   Glucose, UA NEGATIVE NEGATIVE mg/dL   Hgb urine dipstick NEGATIVE NEGATIVE   Bilirubin Urine NEGATIVE NEGATIVE   Ketones, ur NEGATIVE NEGATIVE mg/dL   Protein, ur NEGATIVE NEGATIVE mg/dL   Urobilinogen, UA 0.2 0.0 - 1.0 mg/dL   Nitrite NEGATIVE NEGATIVE   Leukocytes, UA NEGATIVE NEGATIVE   Magnesium     Status: None   Collection Time: 05/04/15  6:00 PM  Result Value Ref Range   Magnesium 2.2 1.7 - 2.4 mg/dL  I-stat troponin, ED     Status: None   Collection Time: 05/04/15  6:29 PM  Result Value Ref Range   Troponin i, poc 0.00 0.00 - 0.08 ng/mL   Comment 3          I-Stat CG4 Lactic Acid, ED     Status: None   Collection Time: 05/04/15  6:31 PM  Result Value Ref Range   Lactic Acid, Venous 1.62 0.5 - 2.0 mmol/L   Dg Chest Portable 1 View  05/04/2015   CLINICAL DATA:  Multiple syncopal episodes.  EXAM: PORTABLE CHEST - 1 VIEW  COMPARISON:  04/19/2015  FINDINGS: The cardiac silhouette, mediastinal and hilar contours are within normal limits and stable. Low lung volumes with mild vascular crowding and streaky atelectasis but no infiltrates, edema or effusions. The bony thorax is intact.  IMPRESSION: No acute cardiopulmonary findings.   Electronically Signed   By: Marijo Sanes M.D.   On: 05/04/2015 18:52     ASSESSMENT:  1. Recurrent orthostatic pre-syncope/syncope in the setting of recently diagnosed massive PE, left leg DVT, biventricular severe systolic dysfunction and no anticoagulation since her discharge on 8/24.   PLAN/DISCUSSION:  Recommend:  - Check orthostatic HR/BP - repeat echocardiogram - Start systemic anticoagulation - would start Heparin infusion from tonight - Not in shock at this time  so no need for emergent thrombolytic therapy    Wandra Mannan, MD Cardiology

## 2015-05-04 NOTE — ED Notes (Signed)
Admitting MD at bedside.

## 2015-05-05 ENCOUNTER — Ambulatory Visit (HOSPITAL_COMMUNITY): Payer: Medicare Other

## 2015-05-05 ENCOUNTER — Other Ambulatory Visit: Payer: Medicare Other

## 2015-05-05 ENCOUNTER — Telehealth: Payer: Self-pay | Admitting: Interventional Cardiology

## 2015-05-05 ENCOUNTER — Encounter (HOSPITAL_COMMUNITY): Payer: Self-pay | Admitting: Nurse Practitioner

## 2015-05-05 DIAGNOSIS — I272 Pulmonary hypertension, unspecified: Secondary | ICD-10-CM | POA: Diagnosis present

## 2015-05-05 DIAGNOSIS — I2699 Other pulmonary embolism without acute cor pulmonale: Secondary | ICD-10-CM

## 2015-05-05 DIAGNOSIS — I429 Cardiomyopathy, unspecified: Secondary | ICD-10-CM

## 2015-05-05 DIAGNOSIS — I5020 Unspecified systolic (congestive) heart failure: Secondary | ICD-10-CM | POA: Diagnosis present

## 2015-05-05 DIAGNOSIS — I10 Essential (primary) hypertension: Secondary | ICD-10-CM

## 2015-05-05 DIAGNOSIS — I9589 Other hypotension: Secondary | ICD-10-CM

## 2015-05-05 DIAGNOSIS — I2692 Saddle embolus of pulmonary artery without acute cor pulmonale: Secondary | ICD-10-CM | POA: Diagnosis present

## 2015-05-05 DIAGNOSIS — R55 Syncope and collapse: Secondary | ICD-10-CM

## 2015-05-05 DIAGNOSIS — E876 Hypokalemia: Secondary | ICD-10-CM

## 2015-05-05 DIAGNOSIS — I509 Heart failure, unspecified: Secondary | ICD-10-CM

## 2015-05-05 DIAGNOSIS — I447 Left bundle-branch block, unspecified: Secondary | ICD-10-CM

## 2015-05-05 DIAGNOSIS — I428 Other cardiomyopathies: Secondary | ICD-10-CM

## 2015-05-05 DIAGNOSIS — I951 Orthostatic hypotension: Secondary | ICD-10-CM

## 2015-05-05 DIAGNOSIS — I82402 Acute embolism and thrombosis of unspecified deep veins of left lower extremity: Secondary | ICD-10-CM | POA: Diagnosis present

## 2015-05-05 DIAGNOSIS — I5022 Chronic systolic (congestive) heart failure: Secondary | ICD-10-CM

## 2015-05-05 DIAGNOSIS — I270 Primary pulmonary hypertension: Secondary | ICD-10-CM

## 2015-05-05 DIAGNOSIS — I2609 Other pulmonary embolism with acute cor pulmonale: Secondary | ICD-10-CM

## 2015-05-05 HISTORY — DX: Chronic systolic (congestive) heart failure: I50.22

## 2015-05-05 LAB — MAGNESIUM
MAGNESIUM: 2.2 mg/dL (ref 1.7–2.4)
Magnesium: 2 mg/dL (ref 1.7–2.4)

## 2015-05-05 LAB — COMPREHENSIVE METABOLIC PANEL
ALT: 14 U/L (ref 14–54)
AST: 24 U/L (ref 15–41)
Albumin: 3.1 g/dL — ABNORMAL LOW (ref 3.5–5.0)
Alkaline Phosphatase: 80 U/L (ref 38–126)
Anion gap: 9 (ref 5–15)
BUN: 21 mg/dL — AB (ref 6–20)
CHLORIDE: 101 mmol/L (ref 101–111)
CO2: 25 mmol/L (ref 22–32)
CREATININE: 1.58 mg/dL — AB (ref 0.44–1.00)
Calcium: 8.7 mg/dL — ABNORMAL LOW (ref 8.9–10.3)
GFR calc non Af Amer: 35 mL/min — ABNORMAL LOW (ref >60)
GFR, EST AFRICAN AMERICAN: 40 mL/min — AB (ref >60)
Glucose, Bld: 101 mg/dL — ABNORMAL HIGH (ref 65–99)
POTASSIUM: 2.8 mmol/L — AB (ref 3.5–5.1)
SODIUM: 135 mmol/L (ref 135–145)
Total Bilirubin: 0.8 mg/dL (ref 0.3–1.2)
Total Protein: 6.5 g/dL (ref 6.5–8.1)

## 2015-05-05 LAB — POTASSIUM: Potassium: 3.7 mmol/L (ref 3.5–5.1)

## 2015-05-05 LAB — CBC
HEMATOCRIT: 33.5 % — AB (ref 36.0–46.0)
HEMATOCRIT: 33.1 % — AB (ref 36.0–46.0)
Hemoglobin: 10.9 g/dL — ABNORMAL LOW (ref 12.0–15.0)
Hemoglobin: 11.1 g/dL — ABNORMAL LOW (ref 12.0–15.0)
MCH: 28.5 pg (ref 26.0–34.0)
MCH: 29.4 pg (ref 26.0–34.0)
MCHC: 32.5 g/dL (ref 30.0–36.0)
MCHC: 33.5 g/dL (ref 30.0–36.0)
MCV: 87.7 fL (ref 78.0–100.0)
MCV: 87.6 fL (ref 78.0–100.0)
PLATELETS: 177 10*3/uL (ref 150–400)
PLATELETS: 150 10*3/uL (ref 150–400)
RBC: 3.82 MIL/uL — AB (ref 3.87–5.11)
RBC: 3.78 MIL/uL — ABNORMAL LOW (ref 3.87–5.11)
RDW: 14.4 % (ref 11.5–15.5)
RDW: 14.5 % (ref 11.5–15.5)
WBC: 9 10*3/uL (ref 4.0–10.5)
WBC: 7.5 10*3/uL (ref 4.0–10.5)

## 2015-05-05 LAB — TROPONIN I

## 2015-05-05 LAB — CREATININE, URINE, RANDOM: Creatinine, Urine: 261.13 mg/dL

## 2015-05-05 LAB — HEPARIN LEVEL (UNFRACTIONATED)
Heparin Unfractionated: 0.72 IU/mL — ABNORMAL HIGH (ref 0.30–0.70)
Heparin Unfractionated: 0.83 IU/mL — ABNORMAL HIGH (ref 0.30–0.70)

## 2015-05-05 LAB — TSH: TSH: 2.309 u[IU]/mL (ref 0.350–4.500)

## 2015-05-05 LAB — SODIUM, URINE, RANDOM: SODIUM UR: 48 mmol/L

## 2015-05-05 LAB — PHOSPHORUS: PHOSPHORUS: 4.7 mg/dL — AB (ref 2.5–4.6)

## 2015-05-05 LAB — APTT: aPTT: 130 seconds — ABNORMAL HIGH (ref 24–37)

## 2015-05-05 MED ORDER — POTASSIUM CHLORIDE CRYS ER 20 MEQ PO TBCR
50.0000 meq | EXTENDED_RELEASE_TABLET | Freq: Once | ORAL | Status: AC
  Administered 2015-05-05: 50 meq via ORAL
  Filled 2015-05-05: qty 3

## 2015-05-05 MED ORDER — RIVAROXABAN 15 MG PO TABS
15.0000 mg | ORAL_TABLET | Freq: Two times a day (BID) | ORAL | Status: DC
  Administered 2015-05-05 – 2015-05-09 (×8): 15 mg via ORAL
  Filled 2015-05-05 (×8): qty 1

## 2015-05-05 MED ORDER — RIVAROXABAN 15 MG PO TABS: 15.0000 mg | ORAL_TABLET | Freq: Two times a day (BID) | ORAL | Status: DC

## 2015-05-05 NOTE — Progress Notes (Signed)
ANTICOAGULATION CONSULT NOTE - Follow Up Consult  Pharmacy Consult for Heparin Indication: DVT, PE  Allergies  Allergen Reactions  . Gadolinium Derivatives Shortness Of Breath and Nausea And Vomiting  . Sulfa Antibiotics Swelling    "Ears swelled up like Dumbo"  . Aripiprazole     Dry mouth with sores   . Codeine Itching and Rash  . Oxycodone Itching  . Penicillins Hives    Patient Measurements: Height: 5\' 5"  (165.1 cm) Weight: 209 lb 10.5 oz (95.1 kg) IBW/kg (Calculated) : 57 Heparin Dosing Weight: 78.2 kg  Vital Signs: Temp: 97.4 F (36.3 C) (08/30 0802) Temp Source: Oral (08/30 0802) BP: 104/60 mmHg (08/30 0802) Pulse Rate: 59 (08/30 0802)  Labs:  Recent Labs  05/04/15 1318 05/04/15 2046 05/05/15 0130 05/05/15 0835  HGB 13.3  --  10.9* 11.1*  HCT 39.9  --  33.5* 33.1*  PLT 189  --  177 150  APTT  --   --   --  130*  HEPARINUNFRC  --   --  0.72* 0.83*  CREATININE 1.98*  --  1.58*  --   TROPONINI  --  <0.03 <0.03 <0.03    Estimated Creatinine Clearance: 43.2 mL/min (by C-G formula based on Cr of 1.58).   Assessment:  Recent hospitalization 8/14 to 8/24 after syncope. Found to have saddle PE, left leg DVT, dilated RV with reduced RV systolic function, LVEF 02%, LBBB  Anticoagulation:  Xarelto pta for PE with right heart strain (med rec says last dose 8/29) --> now on heparin gtt. Heparin level 0.72 (drawn 4hr after bolus) and 0.83 remain elevated possibly due to interaction with Xarelto (last dose 8/24). Add-on aPTT=130 also elevated   Goal of Therapy:  Heparin level 0.3-0.7 units/ml Monitor platelets by anticoagulation protocol: Yes   Plan:  Decrease IV heparin to 900 units/hr Recheck heparin level in 8 hrs. Consider changing to Lovenox.  Alford Highland, [employer] 05/05/2015,10:58 AM

## 2015-05-05 NOTE — Progress Notes (Signed)
  Echocardiogram 2D Echocardiogram has been performed.  Patricia Fischer 05/05/2015, 12:30 PM

## 2015-05-05 NOTE — Telephone Encounter (Signed)
New message    TCM appt on 9.9.2016 @ 8am with Cecilie Kicks . Per Ignacia Bayley PA.

## 2015-05-05 NOTE — Progress Notes (Signed)
[facility] TEAM 1 - Stepdown/ICU TEAM Progress Note  Patricia Fischer VQM:[id number] DOB: Jun 13, 1956 DOA: 05/04/2015 PCP: Golden Pop, MD  Admit HPI / Brief Narrative: 59 y.o.WF PMHx Depression, Anxiety, Essential hypertension; Asthma; Environmental allergies; Pneumonia (2012); Blood transfusion (1979); GERD (gastroesophageal reflux disease); ; Breast Cancer, left breast DCIS (2011); Head injury (2011);; Hemorrhoids; Obese; and Chronic migraine without aura, with intractable migraine, so stated, with status migrainosus (March 2016).   Presented with recurrent episodes of syncope as soon as she gets up from sitting or laying position. Reports occasional chest pain. No shortness of breath. She has been able to ambulate well. She reports poor appetite but forces herself to eat.  IN ER she was noted to be significantly orthostatic. Cr was noted to be up from baseline of 0.9 on discharge to 1.98 Recently admitted for saddle PE with large left femoral, popliteal DVT She was discharged on 8/24 unfortunately she was not able to fill her Xarelto to prescription and has been off of any anticoagulation since her discharge. During her hospitalization echogram was done showing EF of 25-30 percent with diffuse hypokinesis with dilated right ventricle. Prior to her discharge she was started on Coreg Sparonolactone and change from Hyzaar to Cozaar Of note she has hx of Breast cancer in 2011 sp lumpectomy she did not ned chemotherapy. No prior blood clots.    HPI/Subjective: 8/30 A/O 4, NAD states that she did not understand the seriousness of needing to take her Xarelto.  Assessment/Plan: Biventricular severe systolic dysfunction;  - from echocardiogram 8/15 ; 25 - 30%; dilated RV with reduced fxn -8/30 echocardiogram shows possible slight improvement in EF -Currently patient's BP would not tolerate appropriate heart failure medication. -If BP improves would consider restarting BB, would want to  hold off on ACEI, or ARB secondary to patient's acute renal failure  LBBB (left bundle branch block) -unchanged from prior continue to monitor on telemetry   Pulmonary hypertension -Moderate  Saddle embolus of pulmonary artery without acute cor pulmonale  - DC heparin  -Restart Xarelto 15 mg BID   Syncope and collapse  -Most likely multifactorial to include hypotension, DOE secondary to PE. -PT/OT to work with patient in starting 9/1  Hypotension  -Obtain orthostatic vitals in a.m. -Improved  Hypokalemia -Potassium goal> 4 -K-Dur 50 mEq -Recheck potassium and magnesium at 1300  Code Status: FULL Family Communication: no family present at time of exam Disposition Plan: CIR vs SNF    Consultants: Skip Mayer (cardiology)  Procedure/Significant Events: 8/15 echocardiogram;Left ventricle: moderate LVH.- LVEF=25% to 30%. Diffuse hypokinesis. - Right ventricle: moderately to severely dilated.  -Pulmonary arteries: PA peak pressure: 45 mm Hg (S). 8/30 echocardiogram;- LVEF= 35% to 40%. -(grade 1 diastolicdysfunction).   Culture   Antibiotics:   DVT prophylaxis: Xarelto   Devices    LINES / TUBES:      Continuous Infusions:    Objective: VITAL SIGNS: Temp: 97.8 F (36.6 C) (08/30 1918) Temp Source: Oral (08/30 1918) BP: 113/68 mmHg (08/30 1918) Pulse Rate: 76 (08/30 1918) SPO2; FIO2:   Intake/Output Summary (Last 24 hours) at 05/05/15 2140 Last data filed at 05/05/15 2100  Gross per 24 hour  Intake 2288.8 ml  Output      0 ml  Net 2288.8 ml     Exam: General: A/O 4, NAD, No acute respiratory distress Eyes: Negative headache, eye pain, double vision,negative scleral hemorrhage ENT: Negative Runny nose, negative ear pain, negative tinnitus, negative gingival bleeding, Neck:  Negative scars,  masses, torticollis, lymphadenopathy, JVD Lungs: Clear to auscultation bilaterally without wheezes or crackles Cardiovascular: Regular rate  and rhythm without murmur gallop or rub normal S1 and S2 Abdomen:negative abdominal pain, negative dysphagia, nondistended, positive soft, bowel sounds, no rebound, no ascites, no appreciable mass Extremities: No significant cyanosis, clubbing, or edema bilateral lower extremities Psychiatric:  Negative depression, negative anxiety, negative fatigue, negative mania  Neurologic:  Cranial nerves II through XII intact, tongue/uvula midline, all extremities muscle strength 5/5, sensation intact throughout, negative dysarthria, negative expressive aphasia, negative receptive aphasia.    Data Reviewed: Basic Metabolic Panel:  Recent Labs Lab 05/04/15 1318 05/04/15 1800 05/05/15 0130 05/05/15 1329  NA 137  --  135  --   K 3.8  --  2.8* 3.7  CL 104  --  101  --   CO2 23  --  25  --   GLUCOSE 111*  --  101*  --   BUN 23*  --  21*  --   CREATININE 1.98*  --  1.58*  --   CALCIUM 9.7  --  8.7*  --   MG  --  2.2 2.0 2.2  PHOS  --   --  4.7*  --    Liver Function Tests:  Recent Labs Lab 05/05/15 0130  AST 24  ALT 14  ALKPHOS 80  BILITOT 0.8  PROT 6.5  ALBUMIN 3.1*   No results for input(s): LIPASE, AMYLASE in the last 168 hours. No results for input(s): AMMONIA in the last 168 hours. CBC:  Recent Labs Lab 05/04/15 1318 05/05/15 0130 05/05/15 0835  WBC 11.7* 9.0 7.5  HGB 13.3 10.9* 11.1*  HCT 39.9 33.5* 33.1*  MCV 86.9 87.7 87.6  PLT 189 177 150   Cardiac Enzymes:  Recent Labs Lab 05/04/15 2046 05/05/15 0130 05/05/15 0835  TROPONINI <0.03 <0.03 <0.03   BNP (last 3 results)  Recent Labs  04/20/15 1109  BNP 459.6*    ProBNP (last 3 results) No results for input(s): PROBNP in the last 8760 hours.  CBG: No results for input(s): GLUCAP in the last 168 hours.  No results found for this or any previous visit (from the past 240 hour(s)).   Studies:  Recent x-ray studies have been reviewed in detail by the Attending Physician  Scheduled Meds:  Scheduled  Meds: . amitriptyline  50 mg Oral QHS  . atorvastatin  40 mg Oral q1800  . gabapentin  600 mg Oral TID  . pantoprazole  40 mg Oral Q0600  . Rivaroxaban  15 mg Oral BID WC  . sodium chloride  3 mL Intravenous Q12H  . topiramate  200 mg Oral QHS    Time spent on care of this patient: 40 mins   Branon Sabine, Geraldo Docker , MD  [facility] Office  [phone] Pager [phone]  On-Call/Text Page:      [email or web address]      password [email or web address]  If 7PM-7AM, please contact night-coverage [email or web address] Password [email or web address] 05/05/2015, 9:40 PM   LOS: 1 day   Care during the described time interval was provided by me .  I have reviewed this patient's available data, including medical history, events of note, physical examination, and all test results as part of my evaluation. I have personally reviewed and interpreted all radiology studies.   Dia Crawford, MD [phone] Pager

## 2015-05-05 NOTE — Progress Notes (Signed)
ANTICOAGULATION CONSULT NOTE - Follow Up Consult  Pharmacy Consult for heparin Indication: pulmonary embolus   Labs:  Recent Labs  05/04/15 1318 05/04/15 2046 05/05/15 0130  HGB 13.3  --  10.9*  HCT 39.9  --  33.5*  PLT 189  --  177  HEPARINUNFRC  --   --  0.72*  CREATININE 1.98*  --  1.58*  TROPONINI  --  <0.03 <0.03      Assessment/Plan:  59yo female slightly  supratherapeutic on heparin with initial dosing for PE though lab was drawn just a few hours after load and likely still affecting level, expect level to continue to decrease. Will continue gtt at current rate and confirm stable with additional level.   Wynona Neat, PharmD, BCPS  05/05/2015,3:21 AM

## 2015-05-05 NOTE — Progress Notes (Signed)
Subjective:  No shortness of breath presently  Objective:   Vital Signs : Filed Vitals:   05/05/15 0032 05/05/15 0033 05/05/15 0405 05/05/15 0802  BP: _0 104/60  Pulse:    59  Temp:   98 F (36.7 C) 97.4 F (36.3 C)  TempSrc:   Oral Oral  Resp:   14 16  Height:      Weight:      SpO2:    97%    Intake/Output from previous day:  Intake/Output Summary (Last 24 hours) at 05/05/15 1218 Last data filed at 05/05/15 0900  Gross per 24 hour  Intake 3148.8 ml  Output      0 ml  Net 3148.8 ml    I/O since admission: +3148  Wt Readings from Last 3 Encounters:  05/04/15 209 lb 10.5 oz (95.1 kg)  04/19/15 215 lb (97.523 kg)  11/04/14 232 lb 12.8 oz (105.597 kg)    Medications: . amitriptyline  50 mg Oral QHS  . atorvastatin  40 mg Oral q1800  . gabapentin  600 mg Oral TID  . pantoprazole  40 mg Oral Q0600  . sodium chloride  3 mL Intravenous Q12H  . topiramate  200 mg Oral QHS    . heparin 900 Units/hr (05/05/15 1058)    Physical Exam:   General appearance: alert, cooperative and no distress Neck: no adenopathy, no JVD, supple, symmetrical, trachea midline and thyroid not enlarged, symmetric, no tenderness/mass/nodules Lungs: clear to auscultation bilaterally Heart: regular rate and rhythm Abdomen: soft, non-tender; bowel sounds normal; no masses,  no organomegaly Extremities: no edema., negative Homans' Pulses: 2+ and symmetric Neurologic: Grossly normal   Rate: 78  Rhythm: normal sinus rhythm  ECG (independently read by me): SB at 58 ; T inversion I, avL, V1-5  Lab Results:   Recent Labs  05/04/15 1318 05/04/15 1800 05/05/15 0130  NA 137  --  135  K 3.8  --  2.8*  CL 104  --  101  CO2 23  --  25  GLUCOSE 111*  --  101*  BUN 23*  --  21*  CREATININE 1.98*  --  1.58*  CALCIUM 9.7  --  8.7*  MG  --  2.2 2.0  PHOS  --   --  4.7*    Hepatic Function Latest Ref Rng 05/05/2015 04/20/2015 04/20/2015  Total Protein 6.5 - 8.1 g/dL 6.5  6.5 6.0(L)  Albumin 3.5 - 5.0 g/dL 3.1(L) 3.1(L) 2.9(L)  AST 15 - 41 U/L _1 ALT 14 - 54 U/L 14 13(L) 13(L)  Alk Phosphatase 38 - 126 U/L 80 79 73  Total Bilirubin 0.3 - 1.2 mg/dL 0.8 0.7 0.3  Bilirubin, Direct 0.1 - 0.5 mg/dL - 0.2 -     Recent Labs  05/04/15 1318 05/05/15 0130 05/05/15 0835  WBC 11.7* 9.0 7.5  HGB 13.3 10.9* 11.1*  HCT 39.9 33.5* 33.1*  MCV 86.9 87.7 87.6  PLT 189 177 150     Recent Labs  05/04/15 2046 05/05/15 0130 05/05/15 0835  TROPONINI <0.03 <0.03 <0.03    Lab Results  Component Value Date   TSH 2.309 05/05/2015   No results for input(s): HGBA1C in the last 72 hours.   Recent Labs  05/05/15 0130  PROT 6.5  ALBUMIN 3.1*  AST 24  ALT 14  ALKPHOS 80  BILITOT 0.8   No results for input(s): INR in the last 72 hours. BNP (last 3 results)  Recent Labs  04/20/15 1109  BNP 459.6*    ProBNP (last 3 results) No results for input(s): PROBNP in the last 8760 hours.   Lipid Panel     Component Value Date/Time   CHOL 140 04/20/2015 0244   TRIG 164* 04/20/2015 0244   HDL 29* 04/20/2015 0244   CHOLHDL 4.8 04/20/2015 0244   VLDL 33 04/20/2015 0244   LDLCALC 78 04/20/2015 0244      Imaging:  Dg Chest Portable 1 View  05/04/2015   CLINICAL DATA:  Multiple syncopal episodes.  EXAM: PORTABLE CHEST - 1 VIEW  COMPARISON:  04/19/2015  FINDINGS: The cardiac silhouette, mediastinal and hilar contours are within normal limits and stable. Low lung volumes with mild vascular crowding and streaky atelectasis but no infiltrates, edema or effusions. The bony thorax is intact.  IMPRESSION: No acute cardiopulmonary findings.   Electronically Signed   By: Marijo Sanes M.D.   On: 05/04/2015 18:52      Assessment/Plan:   Principal Problem:   Syncope and collapse Active Problems:   LBBB (left bundle branch block)-rate related    Essential hypertension   Obesity-BMI 35   Saddle embolus of pulmonary artery without acute cor pulmonale    Hypotension   Acute renal failure   Chronic systolic CHF (congestive heart failure)   NICM (nonischemic cardiomyopathy)   Hypokalemia   1. Recurrent orthostatic pre-syncope/syncope after recent saddle PE/ left leg DVTand failure to take NOAC since 8/24 ( she had taken the written prescription to pharmacy, med was not in stock) 2. Biventricular severe systolic dysfunction; LVEF 25 - 30%; dilated RV with reduced fxn 3. Moderate Pulmonary hypertension 4. Anemia 5. Renal insufficiency: Cr 1.58  Stage 3 6. Hypokalemia: K 2.8; Mg 2.0 7. Hyperlipidemia: on atorvastatin 40 mg;  LDL 78  With inc TG and low HDL c/w atherogenic profile  Will re-institute xarelto at 15 mg bid x 21 days, then 20 mg daily.  If BP stablized today possibly re-institute ACE-I or ARB tomorrow.  K replete.  ( K was low despite spironolactone/losartan, raising concern for ?hyperaldo).    Troy Sine, MD, [facility] 05/05/2015, 12:18 PM

## 2015-05-06 DIAGNOSIS — I82402 Acute embolism and thrombosis of unspecified deep veins of left lower extremity: Secondary | ICD-10-CM

## 2015-05-06 DIAGNOSIS — I2692 Saddle embolus of pulmonary artery without acute cor pulmonale: Secondary | ICD-10-CM

## 2015-05-06 LAB — BASIC METABOLIC PANEL
Anion gap: 5 (ref 5–15)
BUN: 16 mg/dL (ref 6–20)
CHLORIDE: 107 mmol/L (ref 101–111)
CO2: 26 mmol/L (ref 22–32)
CREATININE: 1.29 mg/dL — AB (ref 0.44–1.00)
Calcium: 9.1 mg/dL (ref 8.9–10.3)
GFR calc Af Amer: 51 mL/min — ABNORMAL LOW (ref >60)
GFR calc non Af Amer: 44 mL/min — ABNORMAL LOW (ref >60)
GLUCOSE: 87 mg/dL (ref 65–99)
Potassium: 4.3 mmol/L (ref 3.5–5.1)
Sodium: 138 mmol/L (ref 135–145)

## 2015-05-06 MED ORDER — ALBUTEROL SULFATE (2.5 MG/3ML) 0.083% IN NEBU: 2.5000 mg | INHALATION_SOLUTION | RESPIRATORY_TRACT | Status: DC | PRN

## 2015-05-06 MED ORDER — SODIUM CHLORIDE 0.9 % IV SOLN
INTRAVENOUS | Status: AC
  Administered 2015-05-06: 50 mL/h via INTRAVENOUS

## 2015-05-06 NOTE — Telephone Encounter (Signed)
Pt still currently admitted. Will leave on triage desk top to address on 9/1.

## 2015-05-06 NOTE — Care Management Note (Signed)
Case Management Note  Patient Details  Name: Patricia Fischer MRN: [id number] Date of Birth: 05-29-1956  Subjective/Objective:    Pt states she did not get card for 30-day free trial offer for Xarelto and that her pharmacy informed her the prescription would cost $74.  CMA called insurance company and found that pt has a deductible and that first prescription would be $74, thereafter would be $45/month.  Provided pt with card for free 30-day trial for first month, pt states her family will pay $74 and that she could pay the $45/month thereafter.                    Expected Discharge Plan:  Home/Self Care   Discharge planning Services  CM Consult, Medication Assistance  Status of Service:  Completed, signed off   Justine Null 05/06/2015, 1:43 PM

## 2015-05-06 NOTE — Discharge Instructions (Addendum)
IT IS VERY IMPORTANT THAT YOU CONTINUE TO TAKE XARELTO.  DO NOT STOP THIS MEDICINE UNLESS DIRECTED TO DO SO BY A DOCTOR.  YOU WILL HAVE TO GET REFILLS ON THIS MEDICINE FROM YOUR PRIMARY CARE DOCTOR ONCE THE STARTER PACK PRESCRIBED BY THE HOSPITAL DOCTOR IS COMPLETED.  THIS IS OF UPMOST IMPORTANCE.  FAILURE TO DO SO COULD LEAD TO WORSENING OF YOUR BLOOD CLOTS AND DEATH.    Because you passed out, you should not drive a car/operate a motorized vehicle until further cleared to do so by our Cardiologist or Primary Care doctor.     Pulmonary Embolism  A pulmonary (lung) embolism (PE) is a blood clot that has traveled to the lung and results in a blockage of blood flow in the affected lung. Most clots come from deep veins in the legs or pelvis. PE is a dangerous and potentially life-threatening condition that can be treated if identified. CAUSES Blood clots form in a vein for different reasons. Usually several things cause blood clots. They include:  The flow of blood slows down.  The inside of the vein is damaged in some way.  The person has a condition that makes the blood clot more easily. RISK FACTORS Some people are more likely than others to develop PE. Risk factors include:   Smoking.  Being overweight (obese).  Sitting or lying still for a long time. This includes long-distance travel, paralysis, or recovery from an illness or surgery. Other factors that increase risk are:   Older age, especially over 67 years of age.  Having a family history of blood clots or if you have already had a blood clot.  Having major or lengthy surgery. This is especially true for surgery on the hip, knee, or belly (abdomen). Hip surgery is particularly high risk.  Having a long, thin tube (catheter) placed inside a vein during a medical procedure.  Breaking a hip or leg.  Having cancer or cancer treatment.  Medicines containing the female hormone estrogen. This includes birth control pills and  hormone replacement therapy.  Other circulation or heart problems.  Pregnancy and childbirth.  Hormone changes make the blood clot more easily during pregnancy.  The fetus puts pressure on the veins of the pelvis.  There is a risk of injury to veins during delivery or a caesarean delivery. The risk is highest just after childbirth.  PREVENTION   Exercise the legs regularly. Take a brisk 30 minute walk every day.  Maintain a weight that is appropriate for your height.  Avoid sitting or lying in bed for long periods of time without moving your legs.  Women, particularly those over the age of 61 years, should consider the risks and benefits of taking estrogen medicines, including birth control pills.  Do not smoke, especially if you take estrogen medicines.  Long-distance travel can increase your risk. You should exercise your legs by walking or pumping the muscles every hour.  Many of the risk factors above relate to situations that exist with hospitalization, either for illness, injury, or elective surgery. Prevention may include medical and nonmedical measures.   Your health care provider will assess you for the need for venous thromboembolism prevention when you are admitted to the hospital. If you are having surgery, your surgeon will assess you the day of or day after surgery.  SYMPTOMS  The symptoms of a PE usually start suddenly and include:  Shortness of breath.  Coughing.  Coughing up blood or blood-tinged mucus.  Chest pain.  Pain is often worse with deep breaths.  Rapid heartbeat. DIAGNOSIS  If a PE is suspected, your health care provider will take a medical history and perform a physical exam. Other tests that may be required include:  Blood tests, such as studies of the clotting properties of your blood.  Imaging tests, such as ultrasound, CT, MRI, and other tests to see if you have clots in your legs or lungs.  An electrocardiogram. This can look for  heart strain from blood clots in the lungs. TREATMENT   The most common treatment for a PE is blood thinning (anticoagulant) medicine, which reduces the blood's tendency to clot. Anticoagulants can stop new blood clots from forming and old clots from growing. They cannot dissolve existing clots. Your body does this by itself over time. Anticoagulants can be given by mouth, through an intravenous (IV) tube, or by injection. Your health care provider will determine the best program for you.  Less commonly, clot-dissolving medicines (thrombolytics) are used to dissolve a PE. They carry a high risk of bleeding, so they are used mainly in severe cases.  Very rarely, a blood clot in the leg needs to be removed surgically.  If you are unable to take anticoagulants, your health care provider may arrange for you to have a filter placed in a main vein in your abdomen. This filter prevents clots from traveling to your lungs. HOME CARE INSTRUCTIONS   Take all medicines as directed by your health care provider.  Learn as much as you can about DVT.  Wear a medical alert bracelet or carry a medical alert card.  Ask your health care provider how soon you can go back to normal activities. It is important to stay active to prevent blood clots. If you are on anticoagulant medicine, avoid contact sports.  It is very important to exercise. This is especially important while traveling, sitting, or standing for long periods of time. Exercise your legs by walking or by tightening and relaxing your leg muscles regularly. Take frequent walks.  You may need to wear compression stockings. These are tight elastic stockings that apply pressure to the lower legs. This pressure can help keep the blood in the legs from clotting. Taking Warfarin Warfarin is a daily medicine that is taken by mouth. Your health care provider will advise you on the length of treatment (usually 3-6 months, sometimes lifelong). If you take  warfarin:  Understand how to take warfarin and foods that can affect how warfarin works in Veterinary surgeon.  Too much and too little warfarin are both dangerous. Too much warfarin increases the risk of bleeding. Too little warfarin continues to allow the risk for blood clots. Warfarin and Regular Blood Testing While taking warfarin, you will need to have regular blood tests to measure your blood clotting time. These blood tests usually include both the prothrombin time (PT) and international normalized ratio (INR) tests. The PT and INR results allow your health care provider to adjust your dose of warfarin. It is very important that you have your PT and INR tested as often as directed by your health care provider.  Warfarin and Your Diet Avoid major changes in your diet, or notify your health care provider before changing your diet. Arrange a visit with a registered dietitian to answer your questions. Many foods, especially foods high in vitamin K, can interfere with warfarin and affect the PT and INR results. You should eat a consistent amount of foods high in vitamin K. Foods high  in vitamin K include:   Spinach, kale, broccoli, cabbage, collard and turnip greens, Brussels sprouts, peas, cauliflower, seaweed, and parsley.  Beef and pork liver.  Green tea.  Soybean oil. Warfarin with Other Medicines Many medicines can interfere with warfarin and affect the PT and INR results. You must:  Tell your health care provider about any and all medicines, vitamins, and supplements you take, including aspirin and other over-the-counter anti-inflammatory medicines. Be especially cautious with aspirin and anti-inflammatory medicines. Ask your health care provider before taking these.  Do not take or discontinue any prescribed or over-the-counter medicine except on the advice of your health care provider or pharmacist. Warfarin Side Effects Warfarin can have side effects, such as easy bruising and difficulty  stopping bleeding. Ask your health care provider or pharmacist about other side effects of warfarin. You will need to:  Hold pressure over cuts for longer than usual.  Notify your dentist and other health care providers that you are taking warfarin before you undergo any procedures where bleeding may occur. Warfarin with Alcohol and Tobacco   Drinking alcohol frequently can increase the effect of warfarin, leading to excess bleeding. It is best to avoid alcoholic drinks or consume only very small amounts while taking warfarin. Notify your health care provider if you change your alcohol intake.  Do not use any tobacco products including cigarettes, chewing tobacco, or electronic cigarettes. If you smoke, quit. Ask your health care provider for help with quitting smoking. Alternative Medicines to Warfarin: Factor Xa Inhibitor Medicines  These blood thinning medicines are taken by mouth, usually for several weeks or longer. It is important to take the medicine every single day, at the same time each day.  There are no regular blood tests required when using these medicines.  There are fewer food and drug interactions than with warfarin.  The side effects of this class of medicine is similar to that of warfarin, including excessive bruising or bleeding. Ask your health care provider or pharmacist about other potential side effects. SEEK MEDICAL CARE IF:   You notice a rapid heartbeat.  You feel weaker or more tired than usual.  You feel faint.  You notice increased bruising.  Your symptoms are not getting better in the time expected.  You are having side effects of medicine. SEEK IMMEDIATE MEDICAL CARE IF:   You have chest pain.  You have trouble breathing.  You have new or increased swelling or pain in one leg.  You cough up blood.  You notice blood in vomit, in a bowel movement, or in urine.  You have a fever. Symptoms of PE may represent a serious problem that is an  emergency. Do not wait to see if the symptoms will go away. Get medical help right away. Call your local emergency services (911 in the [place]). Do not drive yourself to the hospital. Document Released: 08/19/2000 Document Revised: 01/06/2014 Document Reviewed: 09/02/2013 [facility] Patient Information 2015 [place], [place]. This information is not intended to replace advice given to you by your health care provider. Make sure you discuss any questions you have with your health care provider.  Rivaroxaban oral tablets What is this medicine? RIVAROXABAN (ri va ROX a ban) is an anticoagulant (blood thinner). It is used to treat blood clots in the lungs or in the veins. It is also used after knee or hip surgeries to prevent blood clots. It is also used to lower the chance of stroke in people with a medical condition called atrial  fibrillation. This medicine may be used for other purposes; ask your health care provider or pharmacist if you have questions. COMMON BRAND NAME(S): Xarelto, Xarelto Starter Pack What should I tell my health care provider before I take this medicine? They need to know if you have any of these conditions: -bleeding disorders -bleeding in the brain -blood in your stools (black or tarry stools) or if you have blood in your vomit -history of stomach bleeding -kidney disease -liver disease -low blood counts, like low white cell, platelet, or red cell counts -recent or planned spinal or epidural procedure -take medicines that treat or prevent blood clots -an unusual or allergic reaction to rivaroxaban, other medicines, foods, dyes, or preservatives -pregnant or trying to get pregnant -breast-feeding How should I use this medicine? Take this medicine by mouth with a glass of water. Follow the directions on the prescription label. Take your medicine at regular intervals. Do not take it more often than directed. Do not stop taking except on your doctor's advice. Stopping  this medicine may increase your risk of a blot clot. Be sure to refill your prescription before you run out of medicine. If you are taking this medicine after hip or knee replacement surgery, take it with or without food. If you are taking this medicine for atrial fibrillation, take it with your evening meal. If you are taking this medicine to treat blood clots, take it with food at the same time each day. If you are unable to swallow your tablet, you may crush the tablet and mix it in applesauce. Then, immediately eat the applesauce. You should eat more food right after you eat the applesauce containing the crushed tablet. Talk to your pediatrician regarding the use of this medicine in children. Special care may be needed. Overdosage: If you think you have taken too much of this medicine contact a poison control center or emergency room at once. NOTE: This medicine is only for you. Do not share this medicine with others. What if I miss a dose? If you take your medicine once a day and miss a dose, take the missed dose as soon as you remember. If you take your medicine twice a day and miss a dose, take the missed dose immediately. In this instance, 2 tablets may be taken at the same time. The next day you should take 1 tablet twice a day as directed. What may interact with this medicine? -aspirin and aspirin-like medicines -certain antibiotics like erythromycin, azithromycin, and clarithromycin -certain medicines for fungal infections like ketoconazole and itraconazole -certain medicines for irregular heart beat like amiodarone, quinidine, dronedarone -certain medicines for seizures like carbamazepine, phenytoin -certain medicines that treat or prevent blood clots like warfarin, enoxaparin, and dalteparin -conivaptan -diltiazem -felodipine -indinavir -lopinavir; ritonavir -NSAIDS, medicines for pain and inflammation, like ibuprofen or naproxen -ranolazine -rifampin -ritonavir -[facility] John's  wort -verapamil This list may not describe all possible interactions. Give your health care provider a list of all the medicines, herbs, non-prescription drugs, or dietary supplements you use. Also tell them if you smoke, drink alcohol, or use illegal drugs. Some items may interact with your medicine. What should I watch for while using this medicine? Visit your doctor or health care professional for regular checks on your progress. Your condition will be monitored carefully while you are receiving this medicine. Notify your doctor or health care professional and seek emergency treatment if you develop breathing problems; changes in vision; chest pain; severe, sudden headache; pain, swelling, warmth in the  leg; trouble speaking; sudden numbness or weakness of the face, arm, or leg. These can be signs that your condition has gotten worse. If you are going to have surgery, tell your doctor or health care professional that you are taking this medicine. Tell your health care professional that you use this medicine before you have a spinal or epidural procedure. Sometimes people who take this medicine have bleeding problems around the spine when they have a spinal or epidural procedure. This bleeding is very rare. If you have a spinal or epidural procedure while on this medicine, call your health care professional immediately if you have back pain, numbness or tingling (especially in your legs and feet), muscle weakness, paralysis, or loss of bladder or bowel control. Avoid sports and activities that might cause injury while you are using this medicine. Severe falls or injuries can cause unseen bleeding. Be careful when using sharp tools or knives. Consider using an Copy. Take special care brushing or flossing your teeth. Report any injuries, bruising, or red spots on the skin to your doctor or health care professional. What side effects may I notice from receiving this medicine? Side effects that you  should report to your doctor or health care professional as soon as possible: -allergic reactions like skin rash, itching or hives, swelling of the face, lips, or tongue -back pain -redness, blistering, peeling or loosening of the skin, including inside the mouth -signs and symptoms of bleeding such as bloody or black, tarry stools; red or dark-brown urine; spitting up blood or brown material that looks like coffee grounds; red spots on the skin; unusual bruising or bleeding from the eye, gums, or nose Side effects that usually do not require medical attention (Report these to your doctor or health care professional if they continue or are bothersome.): -dizziness -muscle pain This list may not describe all possible side effects. Call your doctor for medical advice about side effects. You may report side effects to FDA at 1-800-FDA-1088. Where should I keep my medicine? Keep out of the reach of children. Store at room temperature between 15 and 30 degrees C (59 and 86 degrees F). Throw away any unused medicine after the expiration date. NOTE: This sheet is a summary. It may not cover all possible information. If you have questions about this medicine, talk to your doctor, pharmacist, or health care provider.  2015, [employer]. (2013-12-12 18:47:48)

## 2015-05-06 NOTE — Progress Notes (Signed)
Orthopedic Tech Progress Note Patient Details:  Patricia Fischer September 28, 1955 [id number] Called [employer] for brace order. Patient ID: [name], female   DOB: [birth year], 59 y.o.   MRN: [id number]   Braulio Bosch 05/06/2015, 3:33 PM

## 2015-05-06 NOTE — Progress Notes (Signed)
Call placed to Dr Claiborne Billings to make aware - Medic hose outside vendor came to [facility] compression ose. Pt says she is very weak says she does not think she can get them on or off. Cannot be worn in bed now pt says I'm not gonna wear them outside. These hose would deliver 20 -30 mm compression. Tech says regular Ted hose deliver up to 12 mm compression.

## 2015-05-06 NOTE — Progress Notes (Signed)
Patient: Patricia Fischer / Admit Date: 05/04/2015 / Date of Encounter: 05/06/2015, 11:58 AM   Subjective: No further syncope. She is tearful, says she's scared. She told me several times throughout our visit that she is not depressed. Good support system at home. Has residual LLE pain which she describes like the leg cramps she usually gets for which she eats bananas. She is receiving hydrocodone/APAP for this. No chest pain or dyspnea.   Objective: Telemetry: NSR, artifact Physical Exam: Blood pressure 103/62, pulse 56, temperature 96.2 F (35.7 C), temperature source Axillary, resp. rate 14, height 5\' 5"  (1.651 m), weight 209 lb 10.5 oz (95.1 kg), SpO2 99 %. General: Well developed, well nourished obese WF in no acute distress. Head: Normocephalic, atraumatic, sclera non-icteric, no xanthomas, nares are without discharge. Neck: Negative for carotid bruits. JVP not elevated. Lungs: Clear bilaterally to auscultation without wheezes, rales, or rhonchi. Breathing is unlabored. Heart: RRR S1 S2 without murmurs, rubs, or gallops.  Abdomen: Soft, non-tender, non-distended with normoactive bowel sounds. No rebound/guarding. Extremities: No clubbing or cyanosis. No edema or erythema. Distal pedal pulses are 2+ and equal bilaterally. Neuro: Alert and oriented X 3. Moves all extremities spontaneously. Psych:  Responds to questions appropriately with a normal affect.   Intake/Output Summary (Last 24 hours) at 05/06/15 1158 Last data filed at 05/06/15 0710  Gross per 24 hour  Intake    600 ml  Output      0 ml  Net    600 ml    Inpatient Medications:  . amitriptyline  50 mg Oral QHS  . atorvastatin  40 mg Oral q1800  . gabapentin  600 mg Oral TID  . pantoprazole  40 mg Oral Q0600  . Rivaroxaban  15 mg Oral BID WC  . sodium chloride  3 mL Intravenous Q12H  . topiramate  200 mg Oral QHS   Infusions:    Labs:  Recent Labs  05/04/15 1318  05/05/15 0130 05/05/15 1329  NA 137  --   135  --   K 3.8  --  2.8* 3.7  CL 104  --  101  --   CO2 23  --  25  --   GLUCOSE 111*  --  101*  --   BUN 23*  --  21*  --   CREATININE 1.98*  --  1.58*  --   CALCIUM 9.7  --  8.7*  --   MG  --   < > 2.0 2.2  PHOS  --   --  4.7*  --   < > = values in this interval not displayed.  Recent Labs  05/05/15 0130  AST 24  ALT 14  ALKPHOS 80  BILITOT 0.8  PROT 6.5  ALBUMIN 3.1*    Recent Labs  05/05/15 0130 05/05/15 0835  WBC 9.0 7.5  HGB 10.9* 11.1*  HCT 33.5* 33.1*  MCV 87.7 87.6  PLT 177 150    Recent Labs  05/04/15 2046 05/05/15 0130 05/05/15 0835  TROPONINI <0.03 <0.03 <0.03   Invalid input(s): POCBNP No results for input(s): HGBA1C in the last 72 hours.   Radiology/Studies:  Dg Chest 2 View  04/19/2015   CLINICAL DATA:  Acute chest pain.  EXAM: CHEST  2 VIEW  COMPARISON:  November 22, 2013.  FINDINGS: The heart size and mediastinal contours are within normal limits. Both lungs are clear. No pneumothorax or pleural effusion is noted. The visualized skeletal structures are unremarkable.  IMPRESSION: No active cardiopulmonary  disease.   Electronically Signed   By: Marijo Conception, M.D.   On: 04/19/2015 12:42   Ct Head Wo Contrast  04/20/2015   CLINICAL DATA:  Headaches  EXAM: CT HEAD WITHOUT CONTRAST  TECHNIQUE: Contiguous axial images were obtained from the base of the skull through the vertex without intravenous contrast.  COMPARISON:  None.  FINDINGS: The bony calvarium is intact. No gross soft tissue abnormality is noted. No findings to suggest acute hemorrhage, acute infarction or space-occupying mass lesion are noted.  IMPRESSION: No acute abnormality seen.   Electronically Signed   By: Inez Catalina M.D.   On: 04/20/2015 13:25   Ct Angio Chest Pe W/cm &/or Wo Cm  04/20/2015   CLINICAL DATA:  Shortness of breath and chest pain  EXAM: CT ANGIOGRAPHY CHEST WITH CONTRAST  TECHNIQUE: Multidetector CT imaging of the chest was performed using the standard protocol during  bolus administration of intravenous contrast. Multiplanar CT image reconstructions and MIPs were obtained to evaluate the vascular anatomy.  CONTRAST:  151mL OMNIPAQUE IOHEXOL 350 MG/ML SOLN  COMPARISON:  Chest radiograph April 19, 2015  FINDINGS: There is saddle type pulmonary embolus extending through both main pulmonary arteries with extension into multiple peripheral branches bilaterally. There is a degree of pulmonary embolus in most peripheral lobes in segments bilaterally. There is right heart strain with right ventricular to left ventricular diameter ratio of 1.1.  There is no appreciable thoracic aortic aneurysm. Great vessels appear normal.  There is patchy atelectasis in both lower lobes, slightly more on the left than on the right and in the posterior segment right upper lobe. There is no frank edema or consolidation.  Thyroid appears normal. There is no appreciable thoracic adenopathy. The pericardium is not thickened.  Visualized upper abdominal structures are normal except for surgical absence of the gallbladder.  There is degenerative change in the thoracic spine. There are no blastic or lytic bone lesions.  Review of the MIP images confirms the above findings.  IMPRESSION: Positive for acute PE with CT evidence of right heart strain (RV/LV Ratio = 1.1) consistent with at least submassive (intermediate risk) PE. The presence of right heart strain has been associated with an increased risk of morbidity and mortality. Please activate Code PE by paging [phone].  Areas of patchy atelectasis. No lung edema or consolidation. No adenopathy. Gallbladder absent.  Critical Value/emergent results were called by telephone at the time of interpretation on 04/20/2015 at 10:30 am to Cecilie Kicks, NP , who verbally acknowledged these results.   Electronically Signed   By: Lowella Grip III M.D.   On: 04/20/2015 10:31   Dg Chest Portable 1 View  05/04/2015   CLINICAL DATA:  Multiple syncopal episodes.   EXAM: PORTABLE CHEST - 1 VIEW  COMPARISON:  04/19/2015  FINDINGS: The cardiac silhouette, mediastinal and hilar contours are within normal limits and stable. Low lung volumes with mild vascular crowding and streaky atelectasis but no infiltrates, edema or effusions. The bony thorax is intact.  IMPRESSION: No acute cardiopulmonary findings.   Electronically Signed   By: Marijo Sanes M.D.   On: 05/04/2015 18:52     Assessment and Plan  66F with history of HTN, asthma, breast CA, obesity was recently admitted 8/14-8/24 with syncopal spell, new LBBB, and found to have submassive saddle PE, L DVT, EF 25-30%, severe RV dysfunction, mod pulm HTN. Recommendation was for anticoagulation and consider L+R HC after 6 months of anticoagulation. She returned 05/04/15 with 6 episodes  of recurrent syncope. She had not been taking Xarelto. There were conflicting stories as to why - she said she never knew about the prescription in her paperwork; another variation is that when she went to the pharmacy to fill it, they told her they didn't have it in stock and finally called her several days later to tell her they did. Orthostatics very positive this admission.  1. Recurrent syncope in the setting of recently diagnosed massive PE (with moderate pulm HTN likely due to this), left leg DVT, biventricular severe systolic dysfunction and no anticoagulation since her discharge on 8/24, suspected multifactorial due to PE/orthostasis - definite orthostasis by vitals yesterday with BP 115/66->78/60 with standing likely 2/2 PE - care management working on trying to get Xarelto approval - there has been conflicting information about the cost ($40s vs $70s/mo). The patient feels like if it is in the $70s she will not be able to afford this. Appreciate care management assistance. May need to consider switching to Coumadin although copays for INR checks may offset cost depending on coverage - will discuss with MD whether outpatient event  monitoring is needed as a precaution given decreased EF  2. Recently diagnosed cardiomyopathy with biventricular heart failure - echo 04/20/15 EF 25-30% with RV dysfunction; f/u echo 05/05/15 EF 35-40%, grade 1 DD, normal RV function but limited study - no ACEI/ARB due to #4 - BP too soft for BB at present time with markedly positive orthostatics yesterday - per d/c notes from last admission, she will need L+R HC after 6 months of anticoagulation  4. AKI on CKD stage III - Cr during last adm 0.9-1.2, admitted at 1.98, improved slightly as of yesterday - further per IM  5. Hypokalemia, repleted - patient reporting leg cramps. Recheck BMET today and replete PRN. May be 2/2 hypokalemia or due to her DVT. Pain medicine already ordered PRN.  7. Hyperlipidemia  - continue statin  Signed, Burna Mortimer Pager: [phone]   Patient seen and examined. Agree with assessment and plan. Orthostatic BP drop this am.  Will benefit from jobe stockings with 20 - 30 mm compression. C/o leg cramps, back on xarelto 15 mg bid.  Social work has arranged for 30 day free, then ~$ 45 copay.   Troy Sine, MD, [facility] 05/06/2015 1:49 PM

## 2015-05-06 NOTE — Progress Notes (Signed)
[place] TEAM 1 - Stepdown/ICU TEAM Progress Note  Patricia Fischer IDP:[id number] DOB: 05-29-1956 DOA: 05/04/2015 PCP: Golden Pop, MD  Admit HPI / Brief Narrative: 59 y.o.F w/ recently admitted for saddle PE with large left femoral & poplitealDVT discharged on 8/24 w/ a Hx Depression, Anxiety, Essential hypertension; Asthma; GERD, Breast Cancer in 2011, Obesity, and Chronic migraine who presented with recurrent episodes of postural syncope with occasional chest pain. No shortness of breath.  She did never filled he Xarelto Rx and had therefore been off anticoag since her d/c.  During that same hospitalization TTE noted an EF of 25-30% with diffuse hypokinesis with dilated right ventricle. Prior to her discharge she was started on Coreg Sparonolactone and changed from Hyzaar to Cozaar   HPI/Subjective: At the time of visit the patient is sitting in a bedside chair eating her lunch.  She appears comfortable.  She reports that she was having severe cramping pain in her left lower extremity earlier today but this subsided with the pain medication her nurse recently gave her.  She denies current shortness of breath chest pain nausea vomiting or abdominal pain.  Assessment/Plan:  Biventricular severe systolic CHF  -TTE 4/43 noted EF 25-30%; dilated RV with reduced fxn -8/30 TTE noted improvement in EF to 35-40% -Currently patient's BP would not tolerate appropriate heart failure medication -If BP improves would consider restarting BB - hold off on ACEI, or ARB secondary to patient's acute renal failure -prior plan was to consider L&R heart cath after ~6 months of anticoag  -Cardiology following   Acute kidney injury on CKD Stage 3 -baseline crt ~1.0-1.2 - crt 1.98 at admission - creatinine is slowly improving with following resuscitation  Left bundle branch block -chronic   Pulmonary hypertension -Moderate - is clearly preload dependent - avoid dehydration  Saddle embolus of  pulmonary artery -restarted Xarelto 15 mg BID - patient has been reeducated on the absolute necessity of very strict compliance with her anticoagulation therapy and the fact that failure to do so would likely result in her sudden death  Syncope and collapse - Hypotension -felt to be due to biventricular CHF + PE not being treated /  noncompliance  -PT/OT to work with patient starting 9/1 -appears to be stabilizing hemodynamically  Hypokalemia -Improving with replacement - magnesium level normal  Obesity - Body mass index is 34.89 kg/(m^2).  Code Status: FULL Family Communication: no family present at time of exam Disposition Plan: SDU until hemodynamically stable - possible candidate for transfer in a.m.  Consultants: Cardiology  Procedure/Significant Events: 8/15 TTE Left ventricle: moderate LVH - LVEF 25% to 30%. Diffuse hypokinesis - Right ventricle: moderately to severely dilated - Pulmonary arteries: PA peak pressure: 45 mm Hg (S). 8/30 TTE LVEF 35% to 40% - grade 1 diastolicdysfunction  Antibiotics: None  DVT prophylaxis: Xarelto  Objective: Blood pressure 119/65, pulse 63, temperature 96.4 F (35.8 C), temperature source Axillary, resp. rate 19, height 5\' 5"  (1.651 m), weight 95.1 kg (209 lb 10.5 oz), SpO2 97 %.  Intake/Output Summary (Last 24 hours) at 05/06/15 1326 Last data filed at 05/06/15 1200  Gross per 24 hour  Intake    720 ml  Output    250 ml  Net    470 ml   Exam: General: No acute respiratory distress - alert and conversant  Lungs: Clear to auscultation bilaterally without wheezes or crackles Cardiovascular: Regular rate and rhythm without murmur gallop or rub normal S1 and S2 Abdomen: Nontender, protuberent, soft,  bowel sounds positive, no rebound, no ascites, no appreciable mass Extremities: No significant cyanosis, or clubbing;  1+edema LLE - RLE w/o edema   Data Reviewed: Basic Metabolic Panel:  Recent Labs Lab 05/04/15 1318 05/04/15 1800  05/05/15 0130 05/05/15 1329  NA 137  --  135  --   K 3.8  --  2.8* 3.7  CL 104  --  101  --   CO2 23  --  25  --   GLUCOSE 111*  --  101*  --   BUN 23*  --  21*  --   CREATININE 1.98*  --  1.58*  --   CALCIUM 9.7  --  8.7*  --   MG  --  2.2 2.0 2.2  PHOS  --   --  4.7*  --    Liver Function Tests:  Recent Labs Lab 05/05/15 0130  AST 24  ALT 14  ALKPHOS 80  BILITOT 0.8  PROT 6.5  ALBUMIN 3.1*   CBC:  Recent Labs Lab 05/04/15 1318 05/05/15 0130 05/05/15 0835  WBC 11.7* 9.0 7.5  HGB 13.3 10.9* 11.1*  HCT 39.9 33.5* 33.1*  MCV 86.9 87.7 87.6  PLT 189 177 150   Cardiac Enzymes:  Recent Labs Lab 05/04/15 2046 05/05/15 0130 05/05/15 0835  TROPONINI <0.03 <0.03 <0.03   BNP (last 3 results)  Recent Labs  04/20/15 1109  BNP 459.6*    Studies:  Recent x-ray studies have been reviewed in detail by the Attending Physician  Scheduled Meds:  Scheduled Meds: . amitriptyline  50 mg Oral QHS  . atorvastatin  40 mg Oral q1800  . gabapentin  600 mg Oral TID  . pantoprazole  40 mg Oral Q0600  . Rivaroxaban  15 mg Oral BID WC  . sodium chloride  3 mL Intravenous Q12H  . topiramate  200 mg Oral QHS    Time spent on care of this patient: 35 mins  Cherene Altes, MD [facility] For Consults/Admissions - Flow Manager - [phone] Office  [phone]  Contact MD directly via text page:      [email or web address]      password [facility]  05/06/2015, 1:26 PM   LOS: 2 days

## 2015-05-07 ENCOUNTER — Encounter: Payer: Medicare Other | Admitting: Cardiology

## 2015-05-07 DIAGNOSIS — I5022 Chronic systolic (congestive) heart failure: Secondary | ICD-10-CM | POA: Diagnosis present

## 2015-05-07 LAB — COMPREHENSIVE METABOLIC PANEL
ALT: 11 U/L — ABNORMAL LOW (ref 14–54)
AST: 20 U/L (ref 15–41)
Albumin: 2.9 g/dL — ABNORMAL LOW (ref 3.5–5.0)
Alkaline Phosphatase: 73 U/L (ref 38–126)
Anion gap: 5 (ref 5–15)
BUN: 13 mg/dL (ref 6–20)
CO2: 26 mmol/L (ref 22–32)
Calcium: 8.6 mg/dL — ABNORMAL LOW (ref 8.9–10.3)
Chloride: 106 mmol/L (ref 101–111)
Creatinine, Ser: 1.23 mg/dL — ABNORMAL HIGH (ref 0.44–1.00)
GFR calc Af Amer: 54 mL/min — ABNORMAL LOW (ref >60)
GFR calc non Af Amer: 47 mL/min — ABNORMAL LOW (ref >60)
Glucose, Bld: 98 mg/dL (ref 65–99)
Potassium: 4.2 mmol/L (ref 3.5–5.1)
Sodium: 137 mmol/L (ref 135–145)
Total Bilirubin: 0.5 mg/dL (ref 0.3–1.2)
Total Protein: 6 g/dL — ABNORMAL LOW (ref 6.5–8.1)

## 2015-05-07 LAB — CBC
HEMATOCRIT: 30.6 % — AB (ref 36.0–46.0)
HEMOGLOBIN: 10.1 g/dL — AB (ref 12.0–15.0)
MCH: 29.5 pg (ref 26.0–34.0)
MCHC: 33 g/dL (ref 30.0–36.0)
MCV: 89.5 fL (ref 78.0–100.0)
Platelets: 173 10*3/uL (ref 150–400)
RBC: 3.42 MIL/uL — ABNORMAL LOW (ref 3.87–5.11)
RDW: 14.6 % (ref 11.5–15.5)
WBC: 6.2 10*3/uL (ref 4.0–10.5)

## 2015-05-07 MED ORDER — HYDROCODONE-ACETAMINOPHEN 5-325 MG PO TABS: 1.0000 | ORAL_TABLET | ORAL | Status: DC | PRN

## 2015-05-07 MED ORDER — RIVAROXABAN 15 MG PO TABS: 15.0000 mg | ORAL_TABLET | Freq: Two times a day (BID) | ORAL | Status: DC

## 2015-05-07 NOTE — Progress Notes (Signed)
Spoke with DR Sherral Hammers about medi compressions stockings. Pt says she does not want to pay for them & will not " wear them out of the house". MD says Ok to d/c order for these hose. After examining & talking with  pt he may consider regular TED hose.

## 2015-05-07 NOTE — Care Management Important Message (Signed)
Important Message  Patient Details  Name: Patricia Fischer MRN: [id number] Date of Birth: 10-Mar-1956   Medicare Important Message Given:  Yes-second notification given    Pricilla Handler 05/07/2015, 10:55 AM

## 2015-05-07 NOTE — Evaluation (Signed)
Physical Therapy Evaluation Patient Details Name: Patricia Fischer MRN: [id number] DOB: 1956-08-19 Today's Date: 05/07/2015   History of Present Illness  59 y.o.F w/ recently admitted for saddle PE with large left femoral & poplitealDVT discharged on 8/24 w/ a Hx Depression, Anxiety, Essential hypertension; Asthma; GERD, Breast Cancer in 2011, Obesity, and Chronic migraine who presented with recurrent episodes of postural syncope with occasional chest pain. No shortness of breath. She did never filled he Xarelto Rx and had therefore been off anticoag since her d/c. During that same hospitalization TTE noted an EF of 25-30% with diffuse hypokinesis with dilated right ventricle. Prior to her discharge she was started on Coreg Sparonolactone and changed from Hyzaar to [place]  Pt admitted with above diagnosis. Pt currently with functional limitations due to the deficits listed below (see PT Problem List). Pt ambulated fairly well with slight unsteady gait which pt can self correct.  Pt would benefit from use of RW however pt refuses to use here but states she has equipment and will use it at home prn.  Pt declines HHPT safety eval as well.  Today during eval, BPs stable without orthostasis.  Will follow acutely.  Pt will benefit from skilled PT to increase their independence and safety with mobility to allow discharge to the venue listed below.      Follow Up Recommendations Home health PT;Supervision - Intermittent (safety eval recommended but pt refusing)    Equipment Recommendations  Other (comment) (Has equipment at home per pt)    Recommendations for Other Services       Precautions / Restrictions Precautions Precautions: Fall Restrictions Weight Bearing Restrictions: No      Mobility  Bed Mobility Overal bed mobility: Independent                Transfers Overall transfer level: Independent Equipment used: None                 Ambulation/Gait Ambulation/Gait assistance: Min guard Ambulation Distance (Feet): 300 Feet Assistive device: 1 person hand held assist Gait Pattern/deviations: Step-through pattern;Decreased stride length;Drifts right/left   Gait velocity interpretation: <1.8 ft/sec, indicative of risk for recurrent falls General Gait Details: Pt without significant LOB in hallways but was generally unsteady reaching for hand rails in hallway vs. holding onto therapist.  Encouraged pt to try RW of which pt refused.  Once back to room , this PT informed pt she probably should use RW at home to incr steadiness and pt states "I will be fine."  Further discussion was given to pt that she really should use device at home and home health safety eval offered however pt declines HHPT aafety eval.  She does say she will use equipment prn.    Stairs            Wheelchair Mobility    Modified Rankin (Stroke Patients Only)       Balance Overall balance assessment: Needs assistance Sitting-balance support: No upper extremity supported;Feet supported Sitting balance-Leahy Scale: Good     Standing balance support: During functional activity;Single extremity supported Standing balance-Leahy Scale: Fair Standing balance comment: Pt could stand without UE support however felt dizzy initially upon PT entering room and was holding onto bedside table.  then pt ambulated over to sink to brush teeth and was able to stand and brush teeth without LOB.  Pertinent Vitals/Pain Pain Assessment: 0-10 Pain Score: 6  Pain Location: left leg Pain Descriptors / Indicators: Aching;Guarding;Grimacing;Sore Pain Intervention(s): Monitored during session;Limited activity within patient's tolerance;Repositioned;Patient requesting pain meds-RN notified  HR 84-113 bpm, 134/92 BP in standing.  129/71 BP on departure.      Home Living Family/patient expects to be discharged to:: Private  residence Living Arrangements: Alone Available Help at Discharge: Family;Available PRN/intermittently Type of Home: House Home Access: Stairs to enter Entrance Stairs-Rails: Right;Left;Can reach both Entrance Stairs-Number of Steps: 2 Home Layout: One level Home Equipment: Other (comment) (States she has equipment from when her mom was sick ) Additional Comments: Pt would not state what equipment she did have however just that she had "all she could ever need."    Prior Function Level of Independence: Independent               Hand Dominance   Dominant Hand: Right    Extremity/Trunk Assessment   Upper Extremity Assessment: Defer to OT evaluation           Lower Extremity Assessment: Generalized weakness      Cervical / Trunk Assessment: Normal  Communication   Communication: No difficulties  Cognition Arousal/Alertness: Awake/alert Behavior During Therapy: WFL for tasks assessed/performed Overall Cognitive Status: Within Functional Limits for tasks assessed                      General Comments General comments (skin integrity, edema, etc.): Pt reports dizziness during eval however BPs were ok.  Pt reports unsteady gait but did not want to use device.  Pt does not want Davis therapy either.  Agreeable for PT to see her in hospital.     Exercises        Assessment/Plan    PT Assessment Patient needs continued PT services  PT Diagnosis Generalized weakness;Acute pain   PT Problem List Decreased activity tolerance;Decreased mobility;Decreased balance;Decreased knowledge of use of DME;Decreased safety awareness;Decreased knowledge of precautions;Pain  PT Treatment Interventions DME instruction;Gait training;Functional mobility training;Therapeutic activities;Therapeutic exercise;Balance training;Patient/family education   PT Goals (Current goals can be found in the Care Plan section) Acute Rehab PT Goals Patient Stated Goal: to go hoome PT Goal  Formulation: With patient Time For Goal Achievement: 05/21/15 Potential to Achieve Goals: Good    Frequency Min 3X/week   Barriers to discharge        Co-evaluation               End of Session Equipment Utilized During Treatment: Gait belt Activity Tolerance: Patient limited by fatigue;Patient limited by pain Patient left: in chair;with call bell/phone within reach Nurse Communication: Mobility status;Patient requests pain meds         Time: 4827-0786 PT Time Calculation (min) (ACUTE ONLY): 19 min   Charges:   PT Evaluation $Initial PT Evaluation Tier I: 1 Procedure     PT G CodesDenice Paradise May 12, 2015, 12:49 PM Corie Vavra,PT Acute Rehabilitation [phone] (pager)

## 2015-05-07 NOTE — Progress Notes (Signed)
Called report to [place] spoke with Lovena Le  RN Pt refused to move to her new room until she has her dinner

## 2015-05-07 NOTE — Telephone Encounter (Signed)
Follow up      TCM appt on 05-15-15 with Hershal Coria.

## 2015-05-07 NOTE — Progress Notes (Signed)
D/w Dr. Claiborne Billings. No new recs. We can hold off med initiation until outpatient. Dr. Claiborne Billings does not feel event monitoring is necessary at this time given definite orthostasis noted this admission. EF also slightly improved on echo and no arrhythmias on telemetry. We already told her yesterday no driving until cleared by cardiology. She can f/u in our office with Cecilie Kicks NP 05/15/15 at 9am. Please call with questions. Shary Lamos PA-C

## 2015-05-07 NOTE — Progress Notes (Signed)
Pt transferred to [place] bed 22 per w/c with all belongings including cell phone & charger  Pt has some cash with her - all ready to lock it up but she said I'll be going home tomorrow & I don't want to lock it up now. Reminded pt about belongings policy Transferred by 2 Nurse techs to new room.

## 2015-05-07 NOTE — Discharge Summary (Signed)
Physician Discharge Summary  Patricia Fischer NUU:[id number] DOB: 1956-01-19 DOA: 05/04/2015  PCP: Patricia Pop, MD  Admit date: 05/04/2015 Discharge date: 05/07/2015  Time spent: 40 minutes  Recommendations for Outpatient Follow-up:  Biventricular severe systolic dysfunction;  - from echocardiogram 8/15 ; 25 - 30%; dilated RV with reduced fxn -8/30 echocardiogram shows possible slight improvement in EF -Currently patient's BP would not tolerate appropriate heart failure medication. -Patient's BP still running on the low side Will hold off on restarting BB, ACEI, or ARB secondary to patient's acute renal failure. -Patient to weigh herself on the day of discharge at home on her scale and record in journal (this will be her dry weight) -Patient follow-up with NP Patricia Fischer (cardiology) on 05/15/15 at 9am  LBBB (left bundle branch block) -unchanged from prior continue to monitor on telemetry   Pulmonary hypertension -Moderate  Saddle embolus of pulmonary artery without acute cor pulmonale  - DC heparin  -Continue Xarelto 15 mg BID. Patient will continue this medication until cardiology clears her to discontinue. -Patient WILL NOT operate motor vehicle until cleared by cardiology  Syncope and collapse  -Most likely multifactorial to include hypotension, DOE secondary to PE. -Follow-up with Dr. Golden Fischer in 7-10 days, PE, DVT, systolic CHF, and hypokalemia  Hypotension  -Obtain orthostatic vitals in a.m. -Improved, but still would not restart BP medication  Hypokalemia -Potassium goal> 4 -WNL, PCP and cardiologist to monitor       Discharge Diagnoses:  Principal Problem:   Syncope and collapse Active Problems:   LBBB (left bundle branch block)-rate related    Essential hypertension   Obesity-BMI 35   Saddle embolus of pulmonary artery without acute cor pulmonale   Hypotension   Acute renal failure   Chronic systolic CHF (congestive heart failure)   NICM  (nonischemic cardiomyopathy)   Hypokalemia   Left leg DVT   Pulmonary hypertension   Saddle pulmonary embolus   Acute cor pulmonale   Other specified hypotension   Discharge Condition: Stable   Diet recommendation: Heart healthy  Filed Weights   05/04/15 1242 05/04/15 1947  Weight: 97.523 kg (215 lb) 95.1 kg (209 lb 10.5 oz)    History of present illness:  59 y.o.WF PMHx Depression, Anxiety, Essential hypertension; Asthma; Environmental allergies; Pneumonia (2012); Blood transfusion (1979); GERD (gastroesophageal reflux disease); ; Breast Cancer, left breast DCIS (2011); Head injury (2011);; Hemorrhoids; Obese; and Chronic migraine without aura, with intractable migraine, so stated, with status migrainosus (March 2016).   Presented with recurrent episodes of syncope as soon as she gets up from sitting or laying position. Reports occasional chest pain. No shortness of breath. She has been able to ambulate well. She reports poor appetite but forces herself to eat.  IN ER she was noted to be significantly orthostatic. Cr was noted to be up from baseline of 0.9 on discharge to 1.98 Recently admitted for saddle PE with large left femoral, popliteal DVT She was discharged on 8/24 unfortunately she was not able to fill her Xarelto to prescription and has been off of any anticoagulation since her discharge. During her hospitalization echogram was done showing EF of 25-30 percent with diffuse hypokinesis with dilated right ventricle. Prior to her discharge she was started on Coreg Sparonolactone and change from Hyzaar to Cozaar Of note she has hx of Breast cancer in 2011 sp lumpectomy she did not ned chemotherapy. No prior blood clots.  During his hospitalization patient was noted to have continue large central PE, and large  left femoral/popliteal DVT which have been untreated since her discharge on 8/24. Per patient miscommunication on starting her anticoagulation. During his hospitalization  patient has been counseled extensively by multiple physicians and this issue should be resolved. Patient also had a repeat echocardiogram which showed slight improvement in her ejection fraction.     Consultants: Patricia Fischer (cardiology)  Procedure/Significant Events: 8/15 echocardiogram;Left ventricle: moderate LVH.- LVEF=25% to 30%. Diffuse hypokinesis. - Right ventricle: moderately to severely dilated.  -Pulmonary arteries: PA peak pressure: 45 mm Hg (S). 8/30 echocardiogram;- LVEF= 35% to 40%. -(grade 1 diastolicdysfunction).   DVT prophylaxis: Xarelto    Discharge Exam: Filed Vitals:   05/07/15 0028 05/07/15 0400 05/07/15 0753 05/07/15 1213  BP: 112/93  105/69 110/59  Pulse: 74  69 74  Temp: 97.6 F (36.4 C) 97.8 F (36.6 C) 97.9 F (36.6 C) 98 F (36.7 C)  TempSrc: Oral Oral Oral Oral  Resp: 9  13 17   Height:      Weight:      SpO2: 96%  96%     General: A/O 4, NAD, No acute respiratory distress Eyes: Negative headache, eye pain, double vision,negative scleral hemorrhage ENT: Negative Runny nose, negative ear pain, negative tinnitus, negative gingival bleeding, Neck: Negative scars, masses, torticollis, lymphadenopathy, JVD Lungs: Clear to auscultation bilaterally without wheezes or crackles Cardiovascular: Regular rate and rhythm without murmur gallop or rub normal S1 and S2 Abdomen:negative abdominal pain, negative dysphagia, nondistended, positive soft, bowel sounds, no rebound, no ascites, no appreciable mass Extremities: No significant cyanosis, clubbing, or edema bilateral lower extremities   Discharge Instructions     Medication List    ASK your doctor about these medications        albuterol 108 (90 BASE) MCG/ACT inhaler  Commonly known as:  PROVENTIL HFA;VENTOLIN HFA  Inhale 2 puffs into the lungs every 6 (six) hours as needed for shortness of breath.     amitriptyline 25 MG tablet  Commonly known as:  ELAVIL  take 2 tablets at  night     atorvastatin 40 MG tablet  Commonly known as:  LIPITOR  Take 1 tablet (40 mg total) by mouth daily at 6 PM.     carvedilol 6.25 MG tablet  Commonly known as:  COREG  Take 1 tablet (6.25 mg total) by mouth 2 (two) times daily with a meal.     fluticasone 50 MCG/ACT nasal spray  Commonly known as:  FLONASE  Place 1 spray into both nostrils daily as needed for allergies or rhinitis.     gabapentin 300 MG capsule  Commonly known as:  NEURONTIN  Take 600 mg by mouth 3 (three) times daily.     indomethacin 50 MG capsule  Commonly known as:  INDOCIN  Take 1 capsule (50 mg total) by mouth 3 (three) times daily with meals.     levalbuterol 45 MCG/ACT inhaler  Commonly known as:  XOPENEX HFA  Inhale 1 puff into the lungs every 4 (four) hours as needed for wheezing.     losartan 25 MG tablet  Commonly known as:  COZAAR  Take 1 tablet (25 mg total) by mouth daily.     ondansetron 4 MG disintegrating tablet  Commonly known as:  ZOFRAN ODT  Take 1 tablet (4 mg total) by mouth every 8 (eight) hours as needed for nausea or vomiting.     pantoprazole 40 MG tablet  Commonly known as:  PROTONIX  Take 1 tablet (40 mg total) by mouth daily  at 6 (six) AM.     rivaroxaban 20 MG Tabs tablet  Commonly known as:  XARELTO  Take 1 tablet (20 mg total) by mouth daily with supper. To start after finished with start pack     spironolactone 25 MG tablet  Commonly known as:  ALDACTONE  Take 1 tablet (25 mg total) by mouth daily.     tiZANidine 2 MG tablet  Commonly known as:  ZANAFLEX  Take 2 mg by mouth at bedtime as needed for muscle spasms.     topiramate 100 MG tablet  Commonly known as:  TOPAMAX  TAKE 1 TABLET (100 MG TOTAL) BY MOUTH AT BEDTIME.       Allergies  Allergen Reactions  . Gadolinium Derivatives Shortness Of Breath and Nausea And Vomiting  . Sulfa Antibiotics Swelling    "Ears swelled up like [place]"  . Aripiprazole     Dry mouth with sores   . Codeine Itching  and Rash  . Oxycodone Itching  . Penicillins Hives       Follow-up Information    Follow up with [facility], NP On 05/15/2015.   Specialties:  Cardiology, Radiology   Why:  9:00 AM - Dr. Garay Caul Nurse Practitioner (note appointment time has changed)   Contact information:   [address] [place] [address] [phone]        The results of significant diagnostics from this hospitalization (including imaging, microbiology, ancillary and laboratory) are listed below for reference.    Significant Diagnostic Studies: Dg Chest 2 View  04/19/2015   CLINICAL DATA:  Acute chest pain.  EXAM: CHEST  2 VIEW  COMPARISON:  November 22, 2013.  FINDINGS: The heart size and mediastinal contours are within normal limits. Both lungs are clear. No pneumothorax or pleural effusion is noted. The visualized skeletal structures are unremarkable.  IMPRESSION: No active cardiopulmonary disease.   Electronically Signed   By: Marijo Conception, M.D.   On: 04/19/2015 12:42   Ct Head Wo Contrast  04/20/2015   CLINICAL DATA:  Headaches  EXAM: CT HEAD WITHOUT CONTRAST  TECHNIQUE: Contiguous axial images were obtained from the base of the skull through the vertex without intravenous contrast.  COMPARISON:  None.  FINDINGS: The bony calvarium is intact. No gross soft tissue abnormality is noted. No findings to suggest acute hemorrhage, acute infarction or space-occupying mass lesion are noted.  IMPRESSION: No acute abnormality seen.   Electronically Signed   By: Inez Catalina M.D.   On: 04/20/2015 13:25   Ct Angio Chest Pe W/cm &/or Wo Cm  04/20/2015   CLINICAL DATA:  Shortness of breath and chest pain  EXAM: CT ANGIOGRAPHY CHEST WITH CONTRAST  TECHNIQUE: Multidetector CT imaging of the chest was performed using the standard protocol during bolus administration of intravenous contrast. Multiplanar CT image reconstructions and MIPs were obtained to evaluate the vascular anatomy.  CONTRAST:  141mL OMNIPAQUE  IOHEXOL 350 MG/ML SOLN  COMPARISON:  Chest radiograph April 19, 2015  FINDINGS: There is saddle type pulmonary embolus extending through both main pulmonary arteries with extension into multiple peripheral branches bilaterally. There is a degree of pulmonary embolus in most peripheral lobes in segments bilaterally. There is right heart strain with right ventricular to left ventricular diameter ratio of 1.1.  There is no appreciable thoracic aortic aneurysm. Great vessels appear normal.  There is patchy atelectasis in both lower lobes, slightly more on the left than on the right and in the posterior  segment right upper lobe. There is no frank edema or consolidation.  Thyroid appears normal. There is no appreciable thoracic adenopathy. The pericardium is not thickened.  Visualized upper abdominal structures are normal except for surgical absence of the gallbladder.  There is degenerative change in the thoracic spine. There are no blastic or lytic bone lesions.  Review of the MIP images confirms the above findings.  IMPRESSION: Positive for acute PE with CT evidence of right heart strain (RV/LV Ratio = 1.1) consistent with at least submassive (intermediate risk) PE. The presence of right heart strain has been associated with an increased risk of morbidity and mortality. Please activate Code PE by paging [phone].  Areas of patchy atelectasis. No lung edema or consolidation. No adenopathy. Gallbladder absent.  Critical Value/emergent results were called by telephone at the time of interpretation on 04/20/2015 at 10:30 am to Patricia Kicks, NP , who verbally acknowledged these results.   Electronically Signed   By: Lowella Grip III M.D.   On: 04/20/2015 10:31   Dg Chest Portable 1 View  05/04/2015   CLINICAL DATA:  Multiple syncopal episodes.  EXAM: PORTABLE CHEST - 1 VIEW  COMPARISON:  04/19/2015  FINDINGS: The cardiac silhouette, mediastinal and hilar contours are within normal limits and stable. Low lung  volumes with mild vascular crowding and streaky atelectasis but no infiltrates, edema or effusions. The bony thorax is intact.  IMPRESSION: No acute cardiopulmonary findings.   Electronically Signed   By: Marijo Sanes M.D.   On: 05/04/2015 18:52    Microbiology: No results found for this or any previous visit (from the past 240 hour(s)).   Labs: Basic Metabolic Panel:  Recent Labs Lab 05/04/15 1318 05/04/15 1800 05/05/15 0130 05/05/15 1329 05/06/15 1247 05/07/15 0305  NA 137  --  135  --  138 137  K 3.8  --  2.8* 3.7 4.3 4.2  CL 104  --  101  --  107 106  CO2 23  --  25  --  26 26  GLUCOSE 111*  --  101*  --  87 98  BUN 23*  --  21*  --  16 13  CREATININE 1.98*  --  1.58*  --  1.29* 1.23*  CALCIUM 9.7  --  8.7*  --  9.1 8.6*  MG  --  2.2 2.0 2.2  --   --   PHOS  --   --  4.7*  --   --   --    Liver Function Tests:  Recent Labs Lab 05/05/15 0130 05/07/15 0305  AST 24 20  ALT 14 11*  ALKPHOS 80 73  BILITOT 0.8 0.5  PROT 6.5 6.0*  ALBUMIN 3.1* 2.9*   No results for input(s): LIPASE, AMYLASE in the last 168 hours. No results for input(s): AMMONIA in the last 168 hours. CBC:  Recent Labs Lab 05/04/15 1318 05/05/15 0130 05/05/15 0835 05/07/15 0305  WBC 11.7* 9.0 7.5 6.2  HGB 13.3 10.9* 11.1* 10.1*  HCT 39.9 33.5* 33.1* 30.6*  MCV 86.9 87.7 87.6 89.5  PLT 189 177 150 173   Cardiac Enzymes:  Recent Labs Lab 05/04/15 2046 05/05/15 0130 05/05/15 0835  TROPONINI <0.03 <0.03 <0.03   BNP: BNP (last 3 results)  Recent Labs  04/20/15 1109  BNP 459.6*    ProBNP (last 3 results) No results for input(s): PROBNP in the last 8760 hours.  CBG: No results for input(s): GLUCAP in the last 168 hours.     Signed:  Dia Crawford, MD [facility] [phone] pager

## 2015-05-08 MED ORDER — ALBUTEROL SULFATE (2.5 MG/3ML) 0.083% IN NEBU
2.5000 mg | INHALATION_SOLUTION | Freq: Four times a day (QID) | RESPIRATORY_TRACT | Status: DC
  Administered 2015-05-08 (×2): 2.5 mg via RESPIRATORY_TRACT
  Filled 2015-05-08 (×2): qty 3

## 2015-05-08 MED ORDER — ALBUTEROL SULFATE HFA 108 (90 BASE) MCG/ACT IN AERS: 2.0000 | INHALATION_SPRAY | Freq: Four times a day (QID) | RESPIRATORY_TRACT | Status: DC

## 2015-05-08 MED ORDER — ALBUTEROL SULFATE (2.5 MG/3ML) 0.083% IN NEBU: 2.5000 mg | INHALATION_SOLUTION | Freq: Four times a day (QID) | RESPIRATORY_TRACT | Status: DC | PRN

## 2015-05-08 NOTE — Evaluation (Signed)
Occupational Therapy Evaluation Patient Details Name: Patricia Fischer MRN: [id number] DOB: 1956/05/16 Today's Date: 05/08/2015    History of Present Illness 59 y.o.F w/ recently admitted for saddle PE with large left femoral & poplitealDVT discharged on 8/24 w/ a Hx Depression, Anxiety, Essential hypertension; Asthma; GERD, Breast Cancer in 2011, Obesity, and Chronic migraine who presented with recurrent episodes of postural syncope with occasional chest pain. No shortness of breath. She did never filled he Xarelto Rx and had therefore been off anticoag since her d/c. During that same hospitalization TTE noted an EF of 25-30% with diffuse hypokinesis with dilated right ventricle. Prior to her discharge she was started on Coreg Sparonolactone and changed from Hyzaar to [place]   Patient presenting with deconditioning secondary to above. Patient independent>mod I PTA. Patient currently functioning at an overall supervision > min assist level. Patient will benefit from acute OT to increase overall independence in the areas of ADLs, functional mobility, and overall safety in order to safely discharge home. At this time, due to decreased balance recommending pt have 24/7 supervision and [place]. Pt with increased pain in LLE ("burning"), RN aware. HR=88, O2-96% after functional mobility activity.      Follow Up Recommendations  Home health OT;Supervision/Assistance - 24 hour    Equipment Recommendations  Other (comment) (TBD)    Recommendations for Other Services  None at this time   Precautions / Restrictions Precautions Precautions: Fall Restrictions Weight Bearing Restrictions: No    Mobility Bed Mobility General bed mobility comments: Pt found in hallway with nursing at beginning of session and left in recliner at end of session.  Transfers Overall transfer level: Needs assistance Equipment used: 1 person hand held assist Transfers: Sit to/from Stand Sit to  Stand: Min assist         General transfer comment: Pt heavily relying on therapist during sit <> stand and functional mobility, due to pain?     Balance Overall balance assessment: Needs assistance Sitting-balance support: No upper extremity supported;Feet supported Sitting balance-Leahy Scale: Good     Standing balance support: No upper extremity supported;During functional activity Standing balance-Leahy Scale: Poor Standing balance comment: heavily relying on therapist during mobility, holding onto therapists arm     ADL Overall ADL's : Needs assistance/impaired General ADL Comments: Pt overall supervision to min assist with ADLs. Pt states she normally wears slip on sandals, rarely wearing socks. Pt stated she has a tub/shower combination and has a shower seat she can use. Pt unsteady on feet and had to hold onto therapist for mobility. HR=88, O2 =96 on RA after activity. Pt found seated on bench at end of hallway with nursing staff. Pt stated she needed to sit secondary to burning sensation in LLE. Pt eager to walk, pt ambulated around unit with increased pain, therapist educated pt on importance of sitting to take break as needed and listening to her body. Left pt seated in recliner with LLE elevated and RN in room, RN aware of burning sesation > LLE.     Pertinent Vitals/Pain Pain Assessment: 0-10 Pain Score: 8  Pain Location: left leg (below calf) Pain Descriptors / Indicators: Burning Pain Intervention(s): Limited activity within patient's tolerance;Monitored during session;Repositioned     Hand Dominance Right   Extremity/Trunk Assessment Upper Extremity Assessment Upper Extremity Assessment: Generalized weakness   Lower Extremity Assessment Lower Extremity Assessment: Generalized weakness   Cervical / Trunk Assessment Cervical / Trunk Assessment: Normal   Communication Communication Communication: Expressive difficulties (  Pt had a hard time getting out certain  words when talking with therapist)   Cognition Arousal/Alertness: Awake/alert Behavior During Therapy: WFL for tasks assessed/performed Overall Cognitive Status: Within Functional Limits for tasks assessed (Pt seems to be a poor historian. Pt also stated she asked RN for water, RN states she never did. )       Memory: Decreased short-term memory                        Home Living Family/patient expects to be discharged to:: Private residence Living Arrangements: Alone Available Help at Discharge: Family;Available PRN/intermittently Type of Home: House Home Access: Stairs to enter [employer] of Steps: 2 Entrance Stairs-Rails: Right;Left;Can reach both Home Layout: One level     Bathroom Shower/Tub: Teacher, early years/pre: Standard     Home Equipment: Shower seat   Additional Comments: During evaluation, patient stated she had a tub/shower. From information that PT entered yesterday, stated pt had walk-in shower. Question if pt a poor historian.       Prior Functioning/Environment Level of Independence: Independent             OT Diagnosis: Generalized weakness;Acute pain   OT Problem List: Decreased activity tolerance;Impaired balance (sitting and/or standing);Decreased safety awareness;Decreased knowledge of use of DME or AE;Pain   OT Treatment/Interventions: Self-care/ADL training;Energy conservation;DME and/or AE instruction;Therapeutic activities;Patient/family education;Balance training    OT Goals(Current goals can be found in the care plan section) Acute Rehab OT Goals Patient Stated Goal: "I want to stay here another night" OT Goal Formulation: With patient Time For Goal Achievement: 05/22/15 Potential to Achieve Goals: Good ADL Goals Pt Will Perform Grooming: standing;with modified independence Pt Will Perform Lower Body Bathing: with modified independence;sit to/from stand Pt Will Perform Lower Body Dressing: with modified  independence;sit to/from stand Pt Will Transfer to Toilet: with modified independence;ambulating Pt Will Perform Tub/Shower Transfer: Tub transfer;with supervision;shower seat;ambulating Additional ADL Goal #1: Pt will be independent>mod I with safe functional mobility throughout room in order to complete ADLs and functional transfers  OT Frequency: Min 2X/week   Barriers to D/C: Decreased caregiver support      End of Session Nurse Communication: Other (comment) (burning sensation > LLE)  Activity Tolerance: Patient limited by pain Patient left: in chair;with nursing/sitter in room;with call bell/phone within reach   Time: 0922-0938 OT Time Calculation (min): 16 min Charges:  OT General Charges $OT Visit: 1 Procedure OT Evaluation $Initial OT Evaluation Tier I: 1 Procedure  Delvonte Berenson , MS, OTR/L, CLT Pager: [phone]  05/08/2015, 11:19 AM

## 2015-05-08 NOTE — Progress Notes (Signed)
[facility] TEAM 1 - Stepdown/ICU TEAM Progress Note  SHALIMAR MCCLAIN MWN:[id number] DOB: 1955-11-13 DOA: 05/04/2015 PCP: Golden Pop, MD  Admit HPI / Brief Narrative: 59 y.o.F w/ recently admitted for saddle PE with large left femoral & poplitealDVT discharged on 8/24 w/ a Hx Depression, Anxiety, Essential hypertension; Asthma; GERD, Breast Cancer in 2011, Obesity, and Chronic migraine who presented with recurrent episodes of postural syncope with occasional chest pain. No shortness of breath.  She did never filled he Xarelto Rx and had therefore been off anticoag since her d/c.  During that same hospitalization TTE noted an EF of 25-30% with diffuse hypokinesis with dilated right ventricle. Prior to her discharge she was started on Coreg Sparonolactone and changed from Hyzaar to Cozaar   HPI/Subjective: The patient complains of severe cramping pain in her left calf which is much worse when she walks.  She also reports feeling somewhat lightheaded when she attempts to walk.  She states that if I discharge her today she will be home alone but that if we delay her discharge until tomorrow she will have her son present with her throughout the entire weekend.  She denies chest pain or shortness of breath.  Assessment/Plan:  Biventricular severe systolic CHF  -TTE 4/03 noted EF 25-30%; dilated RV with reduced fxn -8/30 TTE noted improvement in EF to 35-40% -Currently patient's BP would not tolerate appropriate heart failure medication -prior plan was to consider L&R heart cath after ~6 months of anticoag  -Cardiology  has signed off - patient has a follow-up cardiology appointment on 05/15/15  Acute kidney injury on CKD Stage 3 -baseline crt ~1.0-1.2 - crt 1.98 at admission - creatinine is slowly improving   Left bundle branch block -chronic   Pulmonary hypertension -Moderate - is clearly preload dependent - avoid dehydration  Saddle embolus of pulmonary artery - extensive left lower  extremity DVT  -restarted Xarelto 15 mg BID - patient has been reeducated on the absolute necessity of very strict compliance with her anticoagulation therapy and the fact that failure to do so would likely result in her sudden death - she agrees to remain compliant with Xarelto - cramping of left leg consistent with DVT   Syncope and collapse - Hypotension / Orthostasis -felt to be due to biventricular CHF + PE not being treated /  noncompliance  -PT/OT working with patient  -cardiology recommended Jobe stockings but after being fitted for them the patient has refused to wear them -The patient has been educated that she should execute very slow changes in positioning   Hypokalemia -Normalized  with replacement - magnesium level normal  Obesity - Body mass index is 34.89 kg/(m^2).  Code Status: FULL Family Communication: no family present at time of exam Disposition Plan: encouraged patient to continue in her attempts to ambulate - plan for discharge home in a.m.  As the discharge environment will be safer than it would be today  Consultants: Cardiology  Procedure/Significant Events: 8/15 TTE Left ventricle: moderate LVH - LVEF 25% to 30%. Diffuse hypokinesis - Right ventricle: moderately to severely dilated - Pulmonary arteries: PA peak pressure: 45 mm Hg (S). 8/30 TTE LVEF 35% to 40% - grade 1 diastolicdysfunction  Antibiotics: None  DVT prophylaxis: Xarelto  Objective: Blood pressure 110/65, pulse 72, temperature 98.2 F (36.8 C), temperature source Oral, resp. rate 18, height 5\' 5"  (1.651 m), weight 95.1 kg (209 lb 10.5 oz), SpO2 98 %.  Intake/Output Summary (Last 24 hours) at 05/08/15 1357 Last data  filed at 05/08/15 1347  Gross per 24 hour  Intake   1263 ml  Output    950 ml  Net    313 ml   Exam: General: No respiratory distress - alert and conversant  Lungs: Clear to auscultation bilaterally without wheezes/crackles Cardiovascular: Regular rate and rhythm without  murmur gallop or rub  Abdomen: Nontender, protuberent, soft, bowel sounds positive, no rebound, no ascites, no appreciable mass Extremities: No significant cyanosis, or clubbing;  1+edema LLE - RLE w/o edema   Data Reviewed: Basic Metabolic Panel:  Recent Labs Lab 05/04/15 1318 05/04/15 1800 05/05/15 0130 05/05/15 1329 05/06/15 1247 05/07/15 0305  NA 137  --  135  --  138 137  K 3.8  --  2.8* 3.7 4.3 4.2  CL 104  --  101  --  107 106  CO2 23  --  25  --  26 26  GLUCOSE 111*  --  101*  --  87 98  BUN 23*  --  21*  --  16 13  CREATININE 1.98*  --  1.58*  --  1.29* 1.23*  CALCIUM 9.7  --  8.7*  --  9.1 8.6*  MG  --  2.2 2.0 2.2  --   --   PHOS  --   --  4.7*  --   --   --    Liver Function Tests:  Recent Labs Lab 05/05/15 0130 05/07/15 0305  AST 24 20  ALT 14 11*  ALKPHOS 80 73  BILITOT 0.8 0.5  PROT 6.5 6.0*  ALBUMIN 3.1* 2.9*   CBC:  Recent Labs Lab 05/04/15 1318 05/05/15 0130 05/05/15 0835 05/07/15 0305  WBC 11.7* 9.0 7.5 6.2  HGB 13.3 10.9* 11.1* 10.1*  HCT 39.9 33.5* 33.1* 30.6*  MCV 86.9 87.7 87.6 89.5  PLT 189 177 150 173   Cardiac Enzymes:  Recent Labs Lab 05/04/15 2046 05/05/15 0130 05/05/15 0835  TROPONINI <0.03 <0.03 <0.03   BNP (last 3 results)  Recent Labs  04/20/15 1109  BNP 459.6*    Studies:  Recent x-ray studies have been reviewed in detail by the Attending Physician  Scheduled Meds:  Scheduled Meds: . amitriptyline  50 mg Oral QHS  . atorvastatin  40 mg Oral q1800  . gabapentin  600 mg Oral TID  . pantoprazole  40 mg Oral Q0600  . Rivaroxaban  15 mg Oral BID WC  . sodium chloride  3 mL Intravenous Q12H  . topiramate  200 mg Oral QHS    Time spent on care of this patient: 25 mins  Cherene Altes, MD [facility] For Consults/Admissions - Flow Manager - [phone] Office  [phone]  Contact MD directly via text page:      [email or web address]      password [facility]  05/08/2015, 1:57 PM   LOS: 4 days

## 2015-05-08 NOTE — Telephone Encounter (Signed)
Called pt as TCM but she is still in hospital.  She doesn't know if she will be d/c today or not. States she has a lot of burning in legs so she can't walk. Has an app next Fri 9/9 and advised that may be rescheduled if she doesn't go home today. Attempt to call her on Tues 9/6 since Monday is holiday.

## 2015-05-09 LAB — BASIC METABOLIC PANEL
ANION GAP: 5 (ref 5–15)
BUN: 11 mg/dL (ref 6–20)
CHLORIDE: 105 mmol/L (ref 101–111)
CO2: 30 mmol/L (ref 22–32)
Calcium: 9 mg/dL (ref 8.9–10.3)
Creatinine, Ser: 0.97 mg/dL (ref 0.44–1.00)
GFR calc Af Amer: >60 mL/min (ref >60)
GLUCOSE: 121 mg/dL — AB (ref 65–99)
POTASSIUM: 3.7 mmol/L (ref 3.5–5.1)
Sodium: 140 mmol/L (ref 135–145)

## 2015-05-09 LAB — CBC
HCT: 31.2 % — ABNORMAL LOW (ref 36.0–46.0)
HEMOGLOBIN: 10.2 g/dL — AB (ref 12.0–15.0)
MCH: 29.1 pg (ref 26.0–34.0)
MCHC: 32.7 g/dL (ref 30.0–36.0)
MCV: 89.1 fL (ref 78.0–100.0)
Platelets: 164 10*3/uL (ref 150–400)
RBC: 3.5 MIL/uL — ABNORMAL LOW (ref 3.87–5.11)
RDW: 14.5 % (ref 11.5–15.5)
WBC: 6.7 10*3/uL (ref 4.0–10.5)

## 2015-05-09 MED ORDER — TIZANIDINE HCL 2 MG PO TABS: 2.0000 mg | ORAL_TABLET | Freq: Every evening | ORAL | Status: DC | PRN

## 2015-05-09 MED ORDER — RIVAROXABAN 20 MG PO TABS: 20.0000 mg | ORAL_TABLET | Freq: Every day | ORAL | Status: DC

## 2015-05-09 MED ORDER — RIVAROXABAN (XARELTO) VTE STARTER PACK (15 & 20 MG): ORAL_TABLET | ORAL | Status: DC

## 2015-05-09 MED ORDER — HYDROCODONE-ACETAMINOPHEN 5-325 MG PO TABS: 1.0000 | ORAL_TABLET | ORAL | Status: DC | PRN

## 2015-05-09 NOTE — Progress Notes (Addendum)
Discharge instructions gone over with patient. Home medications gone over. Prescriptions given. Follow up appointments are made. Diet, activity, and reasons to call the doctor gone over. My chart discussed. Educational material regarding blood clots and anticoagulation treatment discussed. Patient is going home on xarelto. Patient verbalized understanding of instructions.

## 2015-05-09 NOTE — Discharge Summary (Signed)
DISCHARGE SUMMARY  Patricia Fischer  MR#: [id number]  DOB:[birth year]  Date of Admission: 05/04/2015 Date of Discharge: 05/09/2015  Attending Physician:Caiya Bettes T  Patient's EQA:[facility] Patricia Rama, MD  Consults:  Cardiology  Disposition: d/c home   Follow-up Appts:     Follow-up Information    Follow up with [facility] R, NP On 05/15/2015.   Specialties:  Cardiology, Radiology   Why:  9:00 AM - Dr. Jakel Fischer Nurse Practitioner (note appointment time has changed)   Contact information:   [address] [place] [address] [phone]       Follow up with [name], MD. Go on 05/15/2015.   Specialty:  Family Medicine   Why:  Follow up  05/15/15 @ 4 pm DR Patricia Fischer is out of office see DR Sanda Klein instead   Contact information:   [place] [address] [phone]      Tests Needing Follow-up: -consider initiating usual CHF medications if BP has improved/will tolerate it (BB + ACEI or ARB) -ASSURE PT IS COMPLIANT W/ HER XARELTO AND PRESCRIBE A REFILL FOR HER ONGOING DOSING  Discharge Diagnoses: Biventricular severe systolic CHF  Acute kidney injury on CKD Stage 3 Left bundle branch block Pulmonary hypertension Saddle embolus of pulmonary artery - extensive left lower extremity DVT  Syncope and collapse - Hypotension / Orthostasis Hypokalemia Obesity - Body mass index is 34.89 kg/(m^2)  Initial presentation: 59 y.o.F w/ recently admitted for saddle PE with large left femoral & poplitealDVT discharged on 8/24 w/ a Hx Depression, Anxiety, Essential hypertension; Asthma; GERD, Breast Cancer in 2011, Obesity, and Chronic migraine who presented with recurrent episodes of postural syncope with occasional chest pain. No shortness of breath. She did never filled he Xarelto Rx and had therefore been off anticoag since her d/c. During that same hospitalization TTE noted an EF of 25-30% with diffuse hypokinesis with dilated right ventricle. Prior to her  discharge she was started on Coreg Sparonolactone and changed from Hyzaar to [facility] Course:  Biventricular severe systolic CHF  -TTE 4/97 noted EF 25-30%; dilated RV with reduced fxn -8/30 TTE noted improvement in EF to 35-40% -Currently patient's BP would not tolerate appropriate heart failure medication (hold off on restarting BB, ACEI, or ARB) -prior plan was to consider L&R heart cath after ~6 months of anticoag  -Cardiology has signed off - patient has a follow-up cardiology appointment on 05/15/15  Acute kidney injury on CKD Stage 3 -baseline crt ~1.0-1.2 - crt 1.98 at admission - creatinine has normalized at the time of d/c    Left bundle branch block -chronic   Pulmonary hypertension -Moderate - is clearly preload dependent - avoid dehydration  Saddle embolus of pulmonary artery - extensive left lower extremity DVT  -restarted Xarelto 15 mg BID - patient has been reeducated on the absolute necessity of very strict compliance with her anticoagulation therapy and the fact that failure to do so would likely result in her sudden death - she agrees to remain compliant with Xarelto - cramping of left leg consistent with DVT - pt was counseled at great length on multiple occasions that she must remain compliant w/ her Eldridge Abrahams and that failure to do so could likely result in her death   Syncope and collapse - Hypotension / Orthostasis -felt to be due to biventricular CHF + PE not being treated / noncompliance  -pt instructed NOT to DRIVE/operate a motorized vehicle until otherwise cleared to do so by her Cardiologist or PCP  -  PT/OT working with patient  -Cardiology recommended Jobe stockings but after being fitted for them the patient refused to wear them -The patient has been educated that she should execute very slow changes in positioning   Hypokalemia -Normalizedwith replacement - magnesium level normal  Obesity - Body mass index is 34.89 kg/(m^2).      Medication List    STOP taking these medications        carvedilol 6.25 MG tablet  Commonly known as:  COREG     indomethacin 50 MG capsule  Commonly known as:  INDOCIN     levalbuterol 45 MCG/ACT inhaler  Commonly known as:  XOPENEX HFA     losartan 25 MG tablet  Commonly known as:  COZAAR     spironolactone 25 MG tablet  Commonly known as:  ALDACTONE      TAKE these medications        albuterol 108 (90 BASE) MCG/ACT inhaler  Commonly known as:  PROVENTIL HFA;VENTOLIN HFA  Inhale 2 puffs into the lungs every 6 (six) hours as needed for shortness of breath.     amitriptyline 25 MG tablet  Commonly known as:  ELAVIL  take 2 tablets at night     atorvastatin 40 MG tablet  Commonly known as:  LIPITOR  Take 1 tablet (40 mg total) by mouth daily at 6 PM.     fluticasone 50 MCG/ACT nasal spray  Commonly known as:  FLONASE  Place 1 spray into both nostrils daily as needed for allergies or rhinitis.     gabapentin 300 MG capsule  Commonly known as:  NEURONTIN  Take 600 mg by mouth 3 (three) times daily.     HYDROcodone-acetaminophen 5-325 MG per tablet  Commonly known as:  NORCO/VICODIN  Take 1-2 tablets by mouth every 4 (four) hours as needed for moderate pain.     ondansetron 4 MG disintegrating tablet  Commonly known as:  ZOFRAN ODT  Take 1 tablet (4 mg total) by mouth every 8 (eight) hours as needed for nausea or vomiting.     pantoprazole 40 MG tablet  Commonly known as:  PROTONIX  Take 1 tablet (40 mg total) by mouth daily at 6 (six) AM.     Rivaroxaban 15 & 20 MG Tbpk  Commonly known as:  XARELTO STARTER PACK  Take as directed on package: Start with one 15mg  tablet by mouth twice a day with food. On Day 22, switch to one 20mg  tablet once a day with food.     tiZANidine 2 MG tablet  Commonly known as:  ZANAFLEX  Take 1 tablet (2 mg total) by mouth at bedtime as needed for muscle spasms.     topiramate 100 MG tablet  Commonly known as:  TOPAMAX  TAKE 1  TABLET (100 MG TOTAL) BY MOUTH AT BEDTIME.        Day of Discharge BP 133/76 mmHg  Pulse 89  Temp(Src) 97.7 F (36.5 C) (Oral)  Resp 18  Ht 5\' 5"  (1.651 m)  Wt 95.1 kg (209 lb 10.5 oz)  BMI 34.89 kg/m2  SpO2 99%  Physical Exam: General: No acute respiratory distress Lungs: Clear to auscultation bilaterally without wheezes or crackles Cardiovascular: Regular rate and rhythm without murmur gallop or rub normal S1 and S2 Abdomen: Nontender, nondistended, soft, bowel sounds positive, no rebound, no ascites, no appreciable mass Extremities: No significant cyanosis, clubbing, or edema bilateral lower extremities  Basic Metabolic Panel:  Recent Labs Lab 05/04/15 1318 05/04/15 1800 05/05/15  0130 05/05/15 1329 05/06/15 1247 05/07/15 0305 05/09/15 0257  NA 137  --  135  --  138 137 140  K 3.8  --  2.8* 3.7 4.3 4.2 3.7  CL 104  --  101  --  107 106 105  CO2 23  --  25  --  26 26 30   GLUCOSE 111*  --  101*  --  87 98 121*  BUN 23*  --  21*  --  16 13 11   CREATININE 1.98*  --  1.58*  --  1.29* 1.23* 0.97  CALCIUM 9.7  --  8.7*  --  9.1 8.6* 9.0  MG  --  2.2 2.0 2.2  --   --   --   PHOS  --   --  4.7*  --   --   --   --     Liver Function Tests:  Recent Labs Lab 05/05/15 0130 05/07/15 0305  AST 24 20  ALT 14 11*  ALKPHOS 80 73  BILITOT 0.8 0.5  PROT 6.5 6.0*  ALBUMIN 3.1* 2.9*   CBC:  Recent Labs Lab 05/04/15 1318 05/05/15 0130 05/05/15 0835 05/07/15 0305 05/09/15 0257  WBC 11.7* 9.0 7.5 6.2 6.7  HGB 13.3 10.9* 11.1* 10.1* 10.2*  HCT 39.9 33.5* 33.1* 30.6* 31.2*  MCV 86.9 87.7 87.6 89.5 89.1  PLT 189 177 150 173 164    Cardiac Enzymes:  Recent Labs Lab 05/04/15 2046 05/05/15 0130 05/05/15 0835  TROPONINI <0.03 <0.03 <0.03    Time spent in discharge (includes decision making & examination of pt): >30 minutes  05/09/2015, 2:10 PM   Cherene Altes, MD [facility] Office  [phone] Pager [phone]  On-Call/Text Page:       [email or web address]      password [facility]

## 2015-05-09 NOTE — Progress Notes (Signed)
Occupational Therapy Treatment Patient Details Name: Patricia Fischer MRN: [id number] DOB: Apr 23, 1956 Today's Date: 05/09/2015    History of present illness 59 y.o.F w/ recently admitted for saddle PE with large left femoral & poplitealDVT discharged on 8/24 w/ a Hx Depression, Anxiety, Essential hypertension; Asthma; GERD, Breast Cancer in 2011, Obesity, and Chronic migraine who presented with recurrent episodes of postural syncope with occasional chest pain. No shortness of breath. She did never filled he Xarelto Rx and had therefore been off anticoag since her d/c. During that same hospitalization TTE noted an EF of 25-30% with diffuse hypokinesis with dilated right ventricle. Prior to her discharge she was started on Coreg Sparonolactone and changed from Hyzaar to Cozaar   OT comments  Patient progressing towards OT goals, continue plan of care for now. Pt's vitals WNL (BP=133/76, HR=89, 02=99%). Pt states she will have supervision post acute d/c for ~1 week(sons girlfriend).    Follow Up Recommendations  Home health OT;Supervision/Assistance - 24 hour    Equipment Recommendations  None recommended by OT    Recommendations for Other Services  None at this time  Precautions / Restrictions Precautions Precautions: Fall Restrictions Weight Bearing Restrictions: No    Mobility Bed Mobility General bed mobility comments: Pt seated in recliner upon OT entering/exiting room  Transfers Overall transfer level: Needs assistance Equipment used: None Transfers: Sit to/from Stand Sit to Stand: Supervision General transfer comment: Supervision for safety, pt reports she has 24/7    Balance Overall balance assessment: Needs assistance Sitting-balance support: Feet supported Sitting balance-Leahy Scale: Good     Standing balance support: No upper extremity supported;During functional activity Standing balance-Leahy Scale: Fair   ADL Overall ADL's : Needs assistance/impaired General  ADL Comments: Pt states she's feeling much better today, decreased pain and overall better. Pt found seated in recliner. Pt stood and performed simulated tub/shower transfer. Pt reports she does not feel that she needs a shower seat, her sons girlfriend will be available for supervision post acute d/c. Pt also states she does not need anything for over the toilet. Therefore, no equipement recommended at this time.      Cognition   Behavior During Therapy: WFL for tasks assessed/performed Overall Cognitive Status: Within Functional Limits for tasks assessed                 Pertinent Vitals/ Pain       Pain Assessment: 0-10 Pain Score: 5  Pain Location: left leg (below calf), 8/10 - headache Pain Descriptors / Indicators: Burning Pain Intervention(s): Monitored during session   Frequency Min 2X/week     Progress Toward Goals  OT Goals(current goals can now befound in the care plan section)  Progress towards OT goals: Progressing toward goals     Plan Discharge plan remains appropriate    Activity Tolerance Patient tolerated treatment well  Patient Left in chair;with call bell/phone within reach;with family/visitor present  Nurse Communication Other (comment) (Pt's vitals)     Time: 2505-3976 OT Time Calculation (min): 11 min  Charges: OT General Charges $OT Visit: 1 Procedure OT Treatments $Self Care/Home Management : 8-22 mins  Shariya Gaster , MS, OTR/L, CLT Pager: [phone]   05/09/2015, 1:23 PM

## 2015-05-12 NOTE — Telephone Encounter (Signed)
Patient contacted regarding discharge from [facility] on 9/3  Patient understands to follow up with provider ? 9/14 @ 11:30am with Rosaria Ferries, PA, pt asked to arrive 15 minutes early.  Patient understands discharge instructions? Yes   Patient understands medications and regiment? Yes   Patient understands to bring all medications to this visit? Yes

## 2015-05-15 ENCOUNTER — Encounter: Payer: Self-pay | Admitting: Family Medicine

## 2015-05-15 ENCOUNTER — Ambulatory Visit (INDEPENDENT_AMBULATORY_CARE_PROVIDER_SITE_OTHER): Payer: Medicare Other | Admitting: Family Medicine

## 2015-05-15 ENCOUNTER — Encounter: Payer: Medicare Other | Admitting: Cardiology

## 2015-05-15 VITALS — BP 133/84 | HR 94 | Temp 97.7°F | Ht 64.0 in | Wt 214.0 lb

## 2015-05-15 DIAGNOSIS — I2692 Saddle embolus of pulmonary artery without acute cor pulmonale: Secondary | ICD-10-CM

## 2015-05-15 DIAGNOSIS — I2699 Other pulmonary embolism without acute cor pulmonale: Secondary | ICD-10-CM | POA: Diagnosis not present

## 2015-05-15 DIAGNOSIS — I5022 Chronic systolic (congestive) heart failure: Secondary | ICD-10-CM

## 2015-05-15 DIAGNOSIS — Z66 Do not resuscitate: Secondary | ICD-10-CM

## 2015-05-15 DIAGNOSIS — D649 Anemia, unspecified: Secondary | ICD-10-CM | POA: Diagnosis not present

## 2015-05-15 DIAGNOSIS — Z5181 Encounter for therapeutic drug level monitoring: Secondary | ICD-10-CM | POA: Diagnosis not present

## 2015-05-15 DIAGNOSIS — I951 Orthostatic hypotension: Secondary | ICD-10-CM | POA: Diagnosis not present

## 2015-05-15 NOTE — Progress Notes (Signed)
BP 133/84 mmHg  Pulse 94  Temp(Src) 97.7 F (36.5 C)  Ht 5\' 4"  (1.626 m)  Wt 214 lb (97.07 kg)  BMI 36.72 kg/m2  SpO2 98%   Subjective:    Patient ID: Patricia Fischer, female    DOB: 1956-03-20, 59 y.o.   MRN: [id number]  HPI: Patricia Fischer is a 59 y.o. female  Chief Complaint  Patient presents with  . Hospitalization Follow-up    was in hospital for P.E. and Hypotensive   She was hospitalized with a large saddle PE; discharge summary reviewed  She is out of breath with walking some distances; able to do things at home; son's girlfriend helps with chores; patient was able to fold clothes but it took a while  Relevant past medical, surgical, family and social history reviewed and updated as indicated. Interim medical history since our last visit reviewed. Allergies and medications reviewed and updated.  Review of Systems  Constitutional: Positive for unexpected weight change (no appetite, weight loss).  Respiratory: Positive for cough (no more than usual).   Cardiovascular: Positive for chest pain (same as during the hospital stay). Negative for leg swelling.  Gastrointestinal: Positive for blood in stool (she has always had blood in the stools; will not have another colonoscopy she says; bad experience; "I've always had blood in my stools", not all the time; has fissures, has had surgery; nothing abnormal since starting blood thinner). Negative for abdominal pain, constipation and abdominal distention.  Genitourinary: Negative for hematuria.  Neurological: Positive for syncope (led to last hospitalization) and light-headedness (every now and then if she gets up too fast).  Hematological: Bruises/bleeds easily.  Psychiatric/Behavioral: The patient is nervous/anxious (does not want anything).    Per HPI unless specifically indicated above     Objective:    BP 133/84 mmHg  Pulse 94  Temp(Src) 97.7 F (36.5 C)  Ht 5\' 4"  (1.626 m)  Wt 214 lb (97.07 kg)  BMI 36.72  kg/m2  SpO2 98%  Wt Readings from Last 3 Encounters:  05/20/15 213 lb (96.616 kg)  05/15/15 214 lb (97.07 kg)  05/04/15 209 lb 10.5 oz (95.1 kg)   She was unable to stand long enough for orthostatic vitals Supine: 113/82 Sitting: 122/69 Standing: had to sit down, machine kept starting over, large cuff, leaking  Physical Exam  Constitutional: She appears well-developed and well-nourished.  Eyes: EOM are normal.  Neck: No JVD present.  Cardiovascular: Normal rate and regular rhythm.   Pulmonary/Chest: Effort normal and breath sounds normal. No accessory muscle usage. No respiratory distress. She has no decreased breath sounds. She has no wheezes. She has no rhonchi.  Skin: Skin is warm and dry. She is not diaphoretic. No pallor.  Psychiatric: Her speech is normal and behavior is normal. Thought content normal. Her mood appears not anxious. Cognition and memory are normal. She does not exhibit a depressed mood.      Assessment & Plan:   Problem List Items Addressed This Visit      Cardiovascular and Mediastinum   Hypotension    Decreased CO following acute insult from large saddle embolism; keep appt with cardiologist      Chronic systolic CHF (congestive heart failure)    No evidence of fluid overload today; limit salt; keep appt with cardiologist next week; per hospital note, she was not at a point where she could tolerate medical treatment for CHF; will see if pressures improve at f/u with heart doctor  Saddle pulmonary embolus - Primary    Recently hospitalized; hospital records reviewed; continue current care; f/u with cardiologist; she is DNR        Other   Anemia   Relevant Orders   CBC with Differential/Platelet (Completed)   Do not resuscitate status    Noted; she says her sons are aware       Other Visit Diagnoses    Medication monitoring encounter        Relevant Orders    Comprehensive metabolic panel (Completed)       Follow up plan: No Follow-up  on file.  Orders Placed This Encounter  Procedures  . CBC with Differential/Platelet  . Comprehensive metabolic panel    An after-visit summary was printed and given to the patient at [place].  Please see the patient instructions which may contain other information and recommendations beyond what is mentioned above in the assessment and plan.

## 2015-05-15 NOTE — Assessment & Plan Note (Addendum)
No evidence of fluid overload today; limit salt; keep appt with cardiologist next week; per hospital note, she was not at a point where she could tolerate medical treatment for CHF; will see if pressures improve at f/u with heart doctor

## 2015-05-15 NOTE — Patient Instructions (Addendum)
Please do ask your cardiologist about the atorvastatin Your HDL is low, but your LDL is only 78 per the labs done in the hospital; see if they want you to continue the 40 mg atorvastatin or reduce the dose Call us if needed Return in 1 month, sooner if needed

## 2015-05-16 LAB — COMPREHENSIVE METABOLIC PANEL
ALBUMIN: 3.9 g/dL (ref 3.6–4.8)
ALK PHOS: 89 IU/L (ref 39–117)
ALT: 15 IU/L (ref 0–32)
AST: 19 IU/L (ref 0–40)
Albumin/Globulin Ratio: 1.3 (ref 1.1–2.5)
BUN / CREAT RATIO: 13 (ref 11–26)
BUN: 12 mg/dL (ref 8–27)
Bilirubin Total: 0.4 mg/dL (ref 0.0–1.2)
CALCIUM: 9.2 mg/dL (ref 8.7–10.3)
CO2: 26 mmol/L (ref 18–29)
CREATININE: 0.94 mg/dL (ref 0.57–1.00)
Chloride: 104 mmol/L (ref 97–108)
GFR calc Af Amer: 76 mL/min/{1.73_m2} (ref >59)
GFR, EST NON AFRICAN AMERICAN: 66 mL/min/{1.73_m2} (ref >59)
GLUCOSE: 95 mg/dL (ref 65–99)
Globulin, Total: 2.9 g/dL (ref 1.5–4.5)
Potassium: 4.4 mmol/L (ref 3.5–5.2)
Sodium: 144 mmol/L (ref 134–144)
TOTAL PROTEIN: 6.8 g/dL (ref 6.0–8.5)

## 2015-05-16 LAB — CBC WITH DIFFERENTIAL/PLATELET
BASOS ABS: 0 10*3/uL (ref 0.0–0.2)
Basos: 0 %
EOS (ABSOLUTE): 0.3 10*3/uL (ref 0.0–0.4)
Eos: 3 %
HEMOGLOBIN: 11.6 g/dL (ref 11.1–15.9)
Hematocrit: 36.4 % (ref 34.0–46.6)
IMMATURE GRANS (ABS): 0 10*3/uL (ref 0.0–0.1)
IMMATURE GRANULOCYTES: 0 %
LYMPHS: 37 %
Lymphocytes Absolute: 2.8 10*3/uL (ref 0.7–3.1)
MCH: 28.6 pg (ref 26.6–33.0)
MCHC: 31.9 g/dL (ref 31.5–35.7)
MCV: 90 fL (ref 79–97)
MONOCYTES: 6 %
Monocytes Absolute: 0.4 10*3/uL (ref 0.1–0.9)
NEUTROS PCT: 54 %
Neutrophils Absolute: 4.1 10*3/uL (ref 1.4–7.0)
Platelets: 209 10*3/uL (ref 150–379)
RBC: 4.05 x10E6/uL (ref 3.77–5.28)
RDW: 14.7 % (ref 12.3–15.4)
WBC: 7.6 10*3/uL (ref 3.4–10.8)

## 2015-05-20 ENCOUNTER — Encounter: Payer: Self-pay | Admitting: Physician Assistant

## 2015-05-20 ENCOUNTER — Ambulatory Visit (INDEPENDENT_AMBULATORY_CARE_PROVIDER_SITE_OTHER): Payer: Medicare Other | Admitting: Physician Assistant

## 2015-05-20 VITALS — BP 120/73 | HR 95 | Ht 64.0 in | Wt 213.0 lb

## 2015-05-20 DIAGNOSIS — R7989 Other specified abnormal findings of blood chemistry: Secondary | ICD-10-CM | POA: Diagnosis not present

## 2015-05-20 DIAGNOSIS — I10 Essential (primary) hypertension: Secondary | ICD-10-CM

## 2015-05-20 DIAGNOSIS — R55 Syncope and collapse: Secondary | ICD-10-CM | POA: Diagnosis not present

## 2015-05-20 DIAGNOSIS — R079 Chest pain, unspecified: Secondary | ICD-10-CM

## 2015-05-20 DIAGNOSIS — R778 Other specified abnormalities of plasma proteins: Secondary | ICD-10-CM

## 2015-05-20 MED ORDER — BLOOD PRESSURE CUFF MISC: 1.0000 | Freq: Once | Status: DC

## 2015-05-20 NOTE — Patient Instructions (Addendum)
Medication Instructions:  Your physician has recommended you make the following change in your medication:  1.  You have been given a prescription for a Blood Pressure Cuff   Labwork: LFT & LIPID IN December @ F/U APPT WITH DR. Tamala Julian  Testing/Procedures: Your physician has requested that you have an echocardiogram on the SAME DAY as your follow-up appointment with Dr. Tamala Julian.  Echocardiography is a painless test that uses sound waves to create images of your heart. It provides your doctor with information about the size and shape of your heart and how well your heart's chambers and valves are working. This procedure takes approximately one hour. There are no restrictions for this procedure.    Follow-Up: Your physician recommends that you schedule a follow-up appointment in: [place]. Tamala Julian.     Any Other Special Instructions Will Be Listed Below (If Applicable).  Please be sure to drink 1-2 liters of water on  DAILY basis.

## 2015-05-20 NOTE — Progress Notes (Signed)
Cardiology Office Note   Date:  05/20/2015   ID:  EZELLA KELL, DOB 06/14/1956, MRN [id number]  PCP:  Golden Pop, MD  Cardiologist:  Dr Oscar La, PA-C   Chief Complaint  Patient presents with  . Shortness of Breath    History of Present Illness: Patricia Fischer is a 59 y.o. female with a history of PE/DVT.  She was hospitalized from 04/19/2015 through 04/29/2015 for saddle embolus of the pulmonary artery, discharged on Xarelto. Echo showed EF 25-30 percent, dilated RV with severely reduced RV function.  She was not able to get the Xarelto filled.  She was hospitalized 05/04/2015 through 05/09/2015 for syncope, PE and discharged on Xarelto. EF 35-40 percent by limited echo.  Patricia Fischer presents for outpatient follow-up. She has been able to get the Xarelto filled and states that she will be able to continue this medication. She is tolerating it well and not having any bleeding issues.  She is anxious and teary and cries frequently. She denies depression multiple times. She states she is not sleeping at all because she is afraid to go to sleep. She is eating very poorly because she has no appetite. She is very fatigued to get short of breath when she tries to do anything. Additionally, her heart rate will go up whenever she tries to do anything.  She wonders if we will refill some of her prescriptions including gabapentin and hydrocodone. She also requested a prescription for blood pressure cuff. She wonders why she is on Lipitor. Her left foot feels like it is asleep at times and is worse when she sits for long periods of time.  She has had some episodes of decreased level of consciousness/loss of consciousness that are orthostatic in nature since she was last discharged from the hospital. These are frightening to her because she lives alone.  Past Medical History  Diagnosis Date  . Essential hypertension   . Asthma   . Environmental allergies    . Pneumonia 2012  . Blood transfusion [place]  . GERD (gastroesophageal reflux disease)   . Depression   . Breast cancer, left breast 2011    DCIS  . Head injury 2011  . Anxiety   . Hemorrhoids   . Obese   . Chronic migraine without aura, with intractable migraine, so stated, with status migrainosus March 2016  . Chronic systolic CHF (congestive heart failure) 05/05/2015  . Syncope Aug 2016  . Orthostatic hypotension Aug. 2016  . Pulmonary emboli Aug. 2016  . Troponin level elevated Aug. 2016  . Acute systolic heart failure Aug 2016    Past Surgical History  Procedure Laterality Date  . Breast surgery Left 2011    Breast lumpectomy  . Cardiac catheterization  2010    [facility]; pt states it was "clear"  . Tonsillectomy    . Appendectomy    . Cholecystectomy    . Tubal ligation    . Anal fissure repair    . Knee cartilage surgery Left   . Carpal tunnel release Bilateral   . Tarsal tunnel release Left 1990  . Anterior cervical decomp/discectomy fusion  01/23/2012    Procedure: ANTERIOR CERVICAL DECOMPRESSION/DISCECTOMY FUSION 1 LEVEL;  Surgeon: Elaina Hoops, MD;  Location: [facility];  Service: Neurosurgery;  Laterality: N/A;  Cervical five-six anterior cervical discectomy fusion with plating  . Cesarean section  1982, 1979  . Abdominal hysterectomy  1986  . Colonoscopy  01/16/2009    Dr Bary Castilla  . Shoulder arthroscopy with subacromial decompression, rotator cuff repair and bicep tendon repair Right 11/22/2013    Procedure: RIGHT SHOULDER ATHROSCOPY OPEN SUBSCAP REPAIR DELTA-PECTORAL ;  Surgeon: Augustin Schooling, MD;  Location: [facility];  Service: Orthopedics;  Laterality: Right;    Current Outpatient Prescriptions  Medication Sig Dispense Refill  . albuterol (PROVENTIL HFA;VENTOLIN HFA) 108 (90 BASE) MCG/ACT inhaler Inhale 2 puffs into the lungs every 6 (six) hours as needed for shortness of breath.     Marland Kitchen amitriptyline (ELAVIL) 25 MG tablet Take 50 mg by  mouth at bedtime.    Marland Kitchen atorvastatin (LIPITOR) 40 MG tablet Take 1 tablet (40 mg total) by mouth daily at 6 PM. 30 tablet 5  . Blood Pressure Monitoring (BLOOD PRESSURE CUFF) MISC 1 Device by Does not apply route once. 1 each 0  . fluticasone (FLONASE) 50 MCG/ACT nasal spray Place 1 spray into both nostrils daily as needed for allergies or rhinitis.   11  . gabapentin (NEURONTIN) 300 MG capsule Take 600 mg by mouth 3 (three) times daily.     Marland Kitchen HYDROcodone-acetaminophen (NORCO/VICODIN) 5-325 MG per tablet Take 1-2 tablets by mouth every 4 (four) hours as needed for moderate pain. 30 tablet 0  . ondansetron (ZOFRAN ODT) 4 MG disintegrating tablet Take 1 tablet (4 mg total) by mouth every 8 (eight) hours as needed for nausea or vomiting. 30 tablet 2  . Rivaroxaban (XARELTO STARTER PACK) 15 & 20 MG TBPK Take as directed on package: Start with one 15mg  tablet by mouth twice a day with food. On Day 22, switch to one 20mg  tablet once a day with food. 51 each 0  . tiZANidine (ZANAFLEX) 2 MG tablet Take 1 tablet (2 mg total) by mouth at bedtime as needed for muscle spasms. 30 tablet 0  . topiramate (TOPAMAX) 100 MG tablet Take 100 mg by mouth at bedtime.     No current facility-administered medications for this visit.    Allergies:   Gadolinium derivatives; Sulfa antibiotics; Aripiprazole; Codeine; Oxycodone; and Penicillins    Social History:  The patient  reports that she has never smoked. She has never used smokeless tobacco. She reports that she does not drink alcohol or use illicit drugs.   Family History:  The patient's family history includes Aneurysm in her paternal grandfather; Breast cancer in her mother and paternal aunt; COPD in her father and paternal grandfather; Cancer in her father and mother; Heart attack in her paternal grandmother; Heart disease in her paternal grandmother; Hypertension in her mother; Migraines in her mother; Rectal cancer in her father; Stroke in her paternal  grandmother. There is no history of Anesthesia problems or Diabetes.    ROS:  Please see the history of present illness. All other systems are reviewed and negative.    PHYSICAL EXAM: VS:  BP 120/73 mmHg  Pulse 95  Ht 5\' 4"  (1.626 m)  Wt 213 lb (96.616 kg)  BMI 36.54 kg/m2 , BMI Body mass index is 36.54 kg/(m^2). GEN: Well nourished, well developed, in no acute distress HEENT: normal for age Neck: no JVD, carotid bruits, or masses Cardiac: RRR; no murmurs, rubs, or gallops,no edema  Respiratory:  clear to auscultation bilaterally, normal work of breathing GI: soft, nontender, nondistended, + BS MS: no deformity or atrophy; left lower extremity is still tender to palpation Skin: warm and dry, no rash Neuro:  Strength and sensation are intact Psych: euthymic mood, full affect  EKG:  EKG is not ordered today.   Recent Labs: 04/20/2015: B Natriuretic Peptide 459.6* 05/05/2015: Magnesium 2.2; TSH 2.309 05/09/2015: Hemoglobin 10.2*; Platelets 164 05/15/2015: ALT 15; BUN 12; Creatinine, Ser 0.94; Potassium 4.4; Sodium 144    Lipid Panel    Component Value Date/Time   CHOL 140 04/20/2015 0244   TRIG 164* 04/20/2015 0244   HDL 29* 04/20/2015 0244   CHOLHDL 4.8 04/20/2015 0244   VLDL 33 04/20/2015 0244   LDLCALC 78 04/20/2015 0244     Wt Readings from Last 3 Encounters:  05/20/15 213 lb (96.616 kg)  05/15/15 214 lb (97.07 kg)  05/04/15 209 lb 10.5 oz (95.1 kg)     Other studies Reviewed: Additional studies/ records that were reviewed today include: Hospital records, labs and ECG.  ASSESSMENT AND PLAN:  1. PE/DVT: Compliance with Xarelto was encouraged and the patient states that she will continue to take it. Orthostatic vital signs were checked. Her blood pressure did not change significantly with position change but her heart rate went from 95 up to 1 25 bpm. She could not get the standing 3 minutes blood pressure because she was too weak and lightheaded.  She is  encouraged to drink plenty of water. She is aware that she should not drive until cleared by cardiology and this may be as much as 6 months. She is encouraged to resume daily activities but only if she will not have to exert herself very much   2. Social and psychological issues: She is encouraged to speak frequently with family and friends. She states she has a prescription for Cymbalta at home and is encouraged to get it filled and take it. She is to follow-up with her primary care physician and discuss her anxiety issues with him. She says repeatedly that she is not pressed but the difference between anxiety and depression was discussed with her.  She is encouraged to contact friends and family including her church family for emotional support.  3. Left ventricular dysfunction and RV dysfunction: She is not having any active ischemic symptoms. She is encouraged to continue current medical therapy and allow her body to heal. She is to follow-up with Dr. Tamala Julian in 3 months with an echocardiogram and at that time a determination can be made about ischemic evaluation.  Current medicines are reviewed at length with the patient today.  The patient has concerns regarding medicines. She was advised that she is on Lipitor because of her HDL is low and this can be rechecked in a few months.  The following changes have been made:  Okay for her to start Cymbalta  Labs/ tests ordered today include:   Orders Placed This Encounter  Procedures  . Lipid Profile  . Hepatic function panel  . Echocardiogram     Disposition:   FU with Dr. Tamala Julian in 3 months  Signed, Lenoard Aden  05/20/2015 5:42 PM    [facility] [address], [place], [place]  [address] Phone: [phone]; Fax: [phone]

## 2015-05-21 NOTE — Assessment & Plan Note (Signed)
Decreased CO following acute insult from large saddle embolism; keep appt with cardiologist

## 2015-05-21 NOTE — Assessment & Plan Note (Signed)
Recently hospitalized; hospital records reviewed; continue current care; f/u with cardiologist; she is DNR

## 2015-05-21 NOTE — Assessment & Plan Note (Signed)
Noted; she says her sons are aware

## 2015-05-26 ENCOUNTER — Telehealth: Payer: Self-pay | Admitting: Family Medicine

## 2015-05-26 NOTE — Telephone Encounter (Signed)
Pt would like a call back to speak to you about something she could take for her headaches and pain in her legs.

## 2015-05-26 NOTE — Telephone Encounter (Signed)
She went to her cardiologist and they would not refill her pain med. They told her to call us. She stated she does not like taking it but sometimes her leg pain is so severe. She also mentioned that she would rather switch to you.

## 2015-05-27 MED ORDER — HYDROCODONE-ACETAMINOPHEN 5-325 MG PO TABS: 1.0000 | ORAL_TABLET | ORAL | Status: DC | PRN

## 2015-05-27 NOTE — Telephone Encounter (Signed)
I spoke with patient She saw her cardiologist; she said it was fine to have family medicine doctor prescribe narcotics; they don't prescribe narcotics; she got it in the hospital; I will allow for additional pain medicine Reviewed [place]; tramadol from neurologist a few months ago; instructions include NO tramadol with this I gave patient my brief controlled substance speech over phone (never mix with alcohol, nerve pills, sleeping pills), always keep locked up, cannot share even out of the goodness of heart, etc.

## 2015-05-30 ENCOUNTER — Telehealth: Payer: Self-pay | Admitting: Nurse Practitioner

## 2015-05-30 NOTE — Telephone Encounter (Signed)
   Pt called this afternoon to report that she's been having moderate, constant chest pressure for several days now.  She has a h/o PE with prior noncompliance with xarelto but has since been compliant and just dropped the dose to 10mg  daily today, after having completed 21 days of 15 mg bid.  She does have some degree of DOE since her PE dx, and this is not any worse.  She does not sound to be in any distress.  I advised that if she has ongoing and/or worsening chest discomfort, her only recourse this weekend would to be to present to the ED for evaluation and rule out.    Caller verbalized understanding and was grateful for the call back.  She will consider coming into the ED if discomfort persists.  Murray Hodgkins, NP 05/30/2015, 4:11 PM

## 2015-06-10 ENCOUNTER — Other Ambulatory Visit: Payer: Self-pay

## 2015-06-10 ENCOUNTER — Encounter: Payer: Self-pay | Admitting: Family Medicine

## 2015-06-10 ENCOUNTER — Encounter: Payer: Self-pay | Admitting: Emergency Medicine

## 2015-06-10 ENCOUNTER — Ambulatory Visit (INDEPENDENT_AMBULATORY_CARE_PROVIDER_SITE_OTHER): Payer: Medicare Other | Admitting: Family Medicine

## 2015-06-10 ENCOUNTER — Emergency Department: Payer: Medicare Other

## 2015-06-10 ENCOUNTER — Emergency Department
Admission: EM | Admit: 2015-06-10 | Discharge: 2015-06-10 | Disposition: A | Discharge status: Home / Self Care | Payer: Medicare Other | Attending: Emergency Medicine | Admitting: Emergency Medicine

## 2015-06-10 VITALS — BP 137/86 | HR 74 | Temp 98.5°F | Wt 205.0 lb

## 2015-06-10 DIAGNOSIS — Z79899 Other long term (current) drug therapy: Secondary | ICD-10-CM | POA: Diagnosis not present

## 2015-06-10 DIAGNOSIS — R0789 Other chest pain: Secondary | ICD-10-CM | POA: Diagnosis not present

## 2015-06-10 DIAGNOSIS — I825Z2 Chronic embolism and thrombosis of unspecified deep veins of left distal lower extremity: Secondary | ICD-10-CM

## 2015-06-10 DIAGNOSIS — R071 Chest pain on breathing: Secondary | ICD-10-CM | POA: Diagnosis not present

## 2015-06-10 DIAGNOSIS — R911 Solitary pulmonary nodule: Secondary | ICD-10-CM | POA: Diagnosis not present

## 2015-06-10 DIAGNOSIS — I2602 Saddle embolus of pulmonary artery with acute cor pulmonale: Secondary | ICD-10-CM

## 2015-06-10 DIAGNOSIS — I2782 Chronic pulmonary embolism: Secondary | ICD-10-CM

## 2015-06-10 DIAGNOSIS — I5022 Chronic systolic (congestive) heart failure: Secondary | ICD-10-CM | POA: Diagnosis not present

## 2015-06-10 DIAGNOSIS — I272 Other secondary pulmonary hypertension: Secondary | ICD-10-CM

## 2015-06-10 DIAGNOSIS — Z880 Allergy status to penicillin: Secondary | ICD-10-CM | POA: Insufficient documentation

## 2015-06-10 DIAGNOSIS — R0602 Shortness of breath: Secondary | ICD-10-CM | POA: Diagnosis not present

## 2015-06-10 DIAGNOSIS — J45901 Unspecified asthma with (acute) exacerbation: Secondary | ICD-10-CM | POA: Insufficient documentation

## 2015-06-10 DIAGNOSIS — R079 Chest pain, unspecified: Secondary | ICD-10-CM | POA: Diagnosis not present

## 2015-06-10 DIAGNOSIS — F418 Other specified anxiety disorders: Secondary | ICD-10-CM

## 2015-06-10 DIAGNOSIS — I10 Essential (primary) hypertension: Secondary | ICD-10-CM | POA: Insufficient documentation

## 2015-06-10 DIAGNOSIS — J45909 Unspecified asthma, uncomplicated: Secondary | ICD-10-CM | POA: Diagnosis not present

## 2015-06-10 DIAGNOSIS — R4589 Other symptoms and signs involving emotional state: Secondary | ICD-10-CM

## 2015-06-10 DIAGNOSIS — F331 Major depressive disorder, recurrent, moderate: Secondary | ICD-10-CM | POA: Diagnosis not present

## 2015-06-10 DIAGNOSIS — J449 Chronic obstructive pulmonary disease, unspecified: Secondary | ICD-10-CM | POA: Diagnosis not present

## 2015-06-10 LAB — CBC
HCT: 40.9 % (ref 35.0–47.0)
Hemoglobin: 13.7 g/dL (ref 12.0–16.0)
MCH: 29 pg (ref 26.0–34.0)
MCHC: 33.5 g/dL (ref 32.0–36.0)
MCV: 86.5 fL (ref 80.0–100.0)
PLATELETS: 181 10*3/uL (ref 150–440)
RBC: 4.73 MIL/uL (ref 3.80–5.20)
RDW: 14.1 % (ref 11.5–14.5)
WBC: 9 10*3/uL (ref 3.6–11.0)

## 2015-06-10 LAB — BASIC METABOLIC PANEL
Anion gap: 6 (ref 5–15)
BUN: 9 mg/dL (ref 6–20)
CALCIUM: 9.4 mg/dL (ref 8.9–10.3)
CO2: 28 mmol/L (ref 22–32)
CREATININE: 0.8 mg/dL (ref 0.44–1.00)
Chloride: 107 mmol/L (ref 101–111)
GFR calc Af Amer: >60 mL/min (ref >60)
GLUCOSE: 103 mg/dL — AB (ref 65–99)
POTASSIUM: 3.5 mmol/L (ref 3.5–5.1)
SODIUM: 141 mmol/L (ref 135–145)

## 2015-06-10 LAB — TROPONIN I: Troponin I: <0.03 ng/mL (ref <0.031)

## 2015-06-10 MED ORDER — IOHEXOL 350 MG/ML SOLN
100.0000 mL | Freq: Once | INTRAVENOUS | Status: AC | PRN
  Administered 2015-06-10: 100 mL via INTRAVENOUS

## 2015-06-10 MED ORDER — RIVAROXABAN 20 MG PO TABS: 20.0000 mg | ORAL_TABLET | Freq: Every day | ORAL | Status: DC

## 2015-06-10 MED ORDER — ESCITALOPRAM OXALATE 10 MG PO TABS: 10.0000 mg | ORAL_TABLET | Freq: Every day | ORAL | Status: DC

## 2015-06-10 MED ORDER — HYDROCODONE-ACETAMINOPHEN 5-325 MG PO TABS: 1.0000 | ORAL_TABLET | ORAL | Status: DC | PRN

## 2015-06-10 NOTE — Progress Notes (Signed)
BP 137/86 mmHg  Pulse 74  Temp(Src) 98.5 F (36.9 C)  Wt 205 lb (92.987 kg)  SpO2 98%   Subjective:    Patient ID: Patricia Fischer, female    DOB: May 26, 1956, 59 y.o.   MRN: [id number]  HPI: Patricia Fischer is a 59 y.o. female  Chief Complaint  Patient presents with  . Follow-up    follow up on blood clots  . OTHER    wants to know if it is safe to get the flu shot  . Chest Pain    patient states its a pressure, not pain, especially when taking a deep breathe. Last saw Cardiology on 9/14.  Marland Kitchen Anxiety    the cardiologist said she needed something for anxiety. She has taken Cymbalta in the past but can't not afford it.   She is having pressure in the left side of the chest; hurts in the shoulder and into the back; tolerable, but hurts; feels like a pulled muscle, but nothing to cause; this has been feeling like this  She called the heart doctor; yesterday it was really bad; hurts to move; hurts to take deep breaths; moving the left arm to reach behind the back is the worst; hurts across the front of the chest; not sore to the touch; cardiologist asked that too; just hurts and pressure; like something is pushing down, just pressure; cardiologist said it got any worse to go to the hospital and they would get blood work No worsening shortness of breath; getting better except unable to take a deep breath without pain; feels like a bruise; wants to push down to make it not hurt; this has been going on for 10 days; no swelling in the legs; does hurt down the arm She talked to him on a Saturday; she just laid around and did nothing She does not want to go to the hospital; I offered a chest CT Medicine does not make her high or drunk or loopy, just a little sleepy; no bad constipation; no alcohol; takes amitriptyline at night and that is okay per her cardiologist she says She is not taking any alcohol or anxiety medicine or antidepressant; has taken cymbalta but it's expensive; she just  cries all the time  Relevant past medical, surgical, family and social history reviewed and updated as indicated. Interim medical history since our last visit reviewed. Allergies and medications reviewed and updated.  Review of Systems  Per HPI unless specifically indicated above     Objective:    BP 137/86 mmHg  Pulse 74  Temp(Src) 98.5 F (36.9 C)  Wt 205 lb (92.987 kg)  SpO2 98%  Wt Readings from Last 3 Encounters:  06/10/15 205 lb (92.987 kg)  06/10/15 205 lb (92.987 kg)  05/20/15 213 lb (96.616 kg)    Physical Exam  Constitutional: She appears well-developed and well-nourished.  Weight loss 9 pounds since Sept 9th  HENT:  Mouth/Throat: Mucous membranes are normal.  Eyes: EOM are normal. No scleral icterus.  Neck: No JVD present.  Cardiovascular: Normal rate, regular rhythm and normal heart sounds.   No extrasystoles are present. Exam reveals no gallop.   Pulmonary/Chest: Effort normal and breath sounds normal. She has no decreased breath sounds. She has no wheezes. She has no rhonchi. She has no rales.  Abdominal: Soft.  Musculoskeletal: She exhibits no edema.  Neurological: She is alert.  Skin: No bruising and no ecchymosis noted.  Psychiatric: Her speech is normal and behavior is normal.  Judgment and thought content normal. She exhibits a depressed mood. She expresses no homicidal and no suicidal ideation.  Briefly tearful; good eye contact with examiner   Results for orders placed or performed in visit on 05/15/15  CBC with Differential/Platelet  Result Value Ref Range   WBC 7.6 3.4 - 10.8 x10E3/uL   RBC 4.05 3.77 - 5.28 x10E6/uL   Hemoglobin 11.6 11.1 - 15.9 g/dL   Hematocrit 36.4 34.0 - 46.6 %   MCV 90 79 - 97 fL   MCH 28.6 26.6 - 33.0 pg   MCHC 31.9 31.5 - 35.7 g/dL   RDW 14.7 12.3 - 15.4 %   Platelets 209 150 - 379 x10E3/uL   Neutrophils 54 %   Lymphs 37 %   Monocytes 6 %   Eos 3 %   Basos 0 %   Neutrophils Absolute 4.1 1.4 - 7.0 x10E3/uL    Lymphocytes Absolute 2.8 0.7 - 3.1 x10E3/uL   Monocytes Absolute 0.4 0.1 - 0.9 x10E3/uL   EOS (ABSOLUTE) 0.3 0.0 - 0.4 x10E3/uL   Basophils Absolute 0.0 0.0 - 0.2 x10E3/uL   Immature Granulocytes 0 %   Immature Grans (Abs) 0.0 0.0 - 0.1 x10E3/uL  Comprehensive metabolic panel  Result Value Ref Range   Glucose 95 65 - 99 mg/dL   BUN 12 8 - 27 mg/dL   Creatinine, Ser 0.94 0.57 - 1.00 mg/dL   GFR calc non Af Amer 66 >59 mL/min/1.73   GFR calc Af Amer 76 >59 mL/min/1.73   BUN/Creatinine Ratio 13 11 - 26   Sodium 144 134 - 144 mmol/L   Potassium 4.4 3.5 - 5.2 mmol/L   Chloride 104 97 - 108 mmol/L   CO2 26 18 - 29 mmol/L   Calcium 9.2 8.7 - 10.3 mg/dL   Total Protein 6.8 6.0 - 8.5 g/dL   Albumin 3.9 3.6 - 4.8 g/dL   Globulin, Total 2.9 1.5 - 4.5 g/dL   Albumin/Globulin Ratio 1.3 1.1 - 2.5   Bilirubin Total 0.4 0.0 - 1.2 mg/dL   Alkaline Phosphatase 89 39 - 117 IU/L   AST 19 0 - 40 IU/L   ALT 15 0 - 32 IU/L      Assessment & Plan:   Problem List Items Addressed This Visit      Cardiovascular and Mediastinum   Chronic systolic CHF (congestive heart failure) ([place])    Managed by cardiologist; she was previously not able to maximized medicines because of hypotension; expect cardiologist to adjust medicine as pressures improve      Relevant Medications   rivaroxaban (XARELTO) 20 MG TABS tablet   Left leg DVT (HCC)    With subsequent PE; continue Factor Xa inhibitor      Relevant Medications   rivaroxaban (XARELTO) 20 MG TABS tablet   Pulmonary hypertension ([place])    Monitored by cardiologist; secondary to right heart strain, saddle PE      Relevant Medications   rivaroxaban (XARELTO) 20 MG TABS tablet   Saddle pulmonary embolus (HCC)    Continue factor Xa inhibitor      Relevant Medications   rivaroxaban (XARELTO) 20 MG TABS tablet     Other   Chest pain on breathing - Primary    Reviewed cardiologist note in Epic, read this to her; encouraged her to go to the ER to be  evaluated, she declined call for ambulance; may need labs and CT scan to evaluate her ongoing chest pain; I will approve refill of pain medicine;  discussed risk of controlled substances including possible unintentional overdose, especially if mixed with alcohol or other pills; typical speech given including illegal to share, even out of the goodness of her heart, always keep in the original bottle, safeguard medicine, do NOT mix with alcohol, other pain pills, "nerve" or anxiety pills, or sleeping pills; I am not obligated to approve of early refill or give new prescription if medicine is lost, stolen, or destroyed even with a police report, etc.; patient agrees with plan; controlled substance contract signed       Depression, major, recurrent, moderate ([place])    Likely set in motion with her recent serious health conditions; discussed, supportive listening provided; start SSRI, with close f/u; reasons to call, seek medical attention reviewed; she agrees      Relevant Medications   escitalopram (LEXAPRO) 10 MG tablet   Anxiety about health    Supportive listening provided; start SSRI; I will not initiate a benzo, there are warnings about use of opioids with comcomittant benzos; SSRI alone with close f/u and support      Relevant Medications   escitalopram (LEXAPRO) 10 MG tablet      Follow up plan: Return 3-4 weeks, for medication follow-up.  Meds ordered this encounter  Medications  . rivaroxaban (XARELTO) 20 MG TABS tablet    Sig: Take 1 tablet (20 mg total) by mouth daily with supper.    Dispense:  30 tablet    Refill:  0  . HYDROcodone-acetaminophen (NORCO/VICODIN) 5-325 MG tablet    Sig: Take 1 tablet by mouth every 4 (four) hours as needed for moderate pain. Do NOT take with tramadol    Dispense:  60 tablet    Refill:  0  . escitalopram (LEXAPRO) 10 MG tablet    Sig: Take 1 tablet (10 mg total) by mouth daily.    Dispense:  30 tablet    Refill:  0

## 2015-06-10 NOTE — ED Provider Notes (Addendum)
CSN: [id number]     Arrival date & time 06/10/15  1157 History   First MD Initiated Contact with Patient 06/10/15 1257     Chief Complaint  Patient presents with  . Chest Pain     (Consider location/radiation/quality/duration/timing/severity/associated sxs/prior Treatment) The history is provided by the patient.  Patricia Fischer is a 59 y.o. female hx of HTN, CHF, recent PE on xarelto here with chest pain, shortness of breath. She was hospitalized a month ago and was diagnosed with submassive PE. She has been taking her Xarelto. She just finished a starter pack 2 weeks ago and now is on the 20 mg daily. Has been having intermittent left-sided chest pain for the last 10 days. Also some associated shortness of breath that reminded her of her PE recently. Denies any fever or cough. Sent by cardiology for rule out PE.    Past Medical History  Diagnosis Date  . Essential hypertension   . Asthma   . Environmental allergies   . Pneumonia 2012  . Blood transfusion [place]  . GERD (gastroesophageal reflux disease)   . Depression   . Breast cancer, left breast ([place]) 2011    DCIS  . Head injury 2011  . Anxiety   . Hemorrhoids   . Obese   . Chronic migraine without aura, with intractable migraine, so stated, with status migrainosus March 2016  . Chronic systolic CHF (congestive heart failure) ([place]) 05/05/2015  . Syncope Aug 2016  . Orthostatic hypotension Aug. 2016  . Pulmonary emboli ([place]) Aug. 2016  . Troponin level elevated Aug. 2016  . Acute systolic heart failure [facility]) Aug 2016   Past Surgical History  Procedure Laterality Date  . Breast surgery Left 2011    Breast lumpectomy  . Cardiac catheterization  2010    [facility]; pt states it was "clear"  . Tonsillectomy    . Appendectomy    . Cholecystectomy    . Tubal ligation    . Anal fissure repair    . Knee cartilage surgery Left   . Carpal tunnel release Bilateral   . Tarsal tunnel release Left 1990  .  Anterior cervical decomp/discectomy fusion  01/23/2012    Procedure: ANTERIOR CERVICAL DECOMPRESSION/DISCECTOMY FUSION 1 LEVEL;  Surgeon: Elaina Hoops, MD;  Location: [facility];  Service: Neurosurgery;  Laterality: N/A;  Cervical five-six anterior cervical discectomy fusion with plating  . Cesarean section  1982, 1979  . Abdominal hysterectomy  1986  . Colonoscopy  01/16/2009    Dr Bary Castilla  . Shoulder arthroscopy with subacromial decompression, rotator cuff repair and bicep tendon repair Right 11/22/2013    Procedure: RIGHT SHOULDER ATHROSCOPY OPEN SUBSCAP REPAIR DELTA-PECTORAL ;  Surgeon: Augustin Schooling, MD;  Location: [place];  Service: Orthopedics;  Laterality: Right;   Family History  Problem Relation Age of Onset  . Anesthesia problems Neg Hx   . Diabetes Neg Hx   . Breast cancer Paternal Aunt   . Breast cancer Mother   . Migraines Mother   . Cancer Mother   . Hypertension Mother   . Rectal cancer Father   . Cancer Father   . COPD Father   . Heart disease Paternal Grandmother   . Stroke Paternal Grandmother   . Aneurysm Paternal Grandfather   . COPD Paternal Grandfather   . Heart attack Paternal Grandmother    Social History  Substance Use Topics  . Smoking status: Never Smoker   . Smokeless tobacco:  Never Used  . Alcohol Use: No     Comment: occasional wine   OB History    No data available     Review of Systems  Respiratory: Positive for shortness of breath.   Cardiovascular: Positive for chest pain.  All other systems reviewed and are negative.     Allergies  Gadolinium derivatives; Sulfa antibiotics; Aripiprazole; Codeine; Oxycodone; and Penicillins  Home Medications   Prior to Admission medications   Medication Sig Start Date End Date Taking? Authorizing Provider  albuterol (PROVENTIL HFA;VENTOLIN HFA) 108 (90 BASE) MCG/ACT inhaler Inhale 2 puffs into the lungs every 6 (six) hours as needed for shortness of breath.     Historical Provider, MD   amitriptyline (ELAVIL) 25 MG tablet Take 50 mg by mouth at bedtime.    Historical Provider, MD  atorvastatin (LIPITOR) 40 MG tablet Take 1 tablet (40 mg total) by mouth daily at 6 PM. 04/29/15   Almyra Deforest, PA  Blood Pressure Monitoring (BLOOD PRESSURE CUFF) MISC 1 Device by Does not apply route once. 05/20/15   Rhonda G Barrett, PA-C  escitalopram (LEXAPRO) 10 MG tablet Take 1 tablet (10 mg total) by mouth daily. 06/10/15   Arnetha Courser, MD  fluticasone (FLONASE) 50 MCG/ACT nasal spray Place 1 spray into both nostrils daily as needed for allergies or rhinitis.  07/04/14   Historical Provider, MD  HYDROcodone-acetaminophen (NORCO/VICODIN) 5-325 MG tablet Take 1 tablet by mouth every 4 (four) hours as needed for moderate pain. Do NOT take with tramadol 06/10/15   Arnetha Courser, MD  ondansetron (ZOFRAN ODT) 4 MG disintegrating tablet Take 1 tablet (4 mg total) by mouth every 8 (eight) hours as needed for nausea or vomiting. 03/18/15   Kathrynn Ducking, MD  rivaroxaban (XARELTO) 20 MG TABS tablet Take 1 tablet (20 mg total) by mouth daily with supper. 06/10/15   Arnetha Courser, MD  tiZANidine (ZANAFLEX) 2 MG tablet Take 1 tablet (2 mg total) by mouth at bedtime as needed for muscle spasms. 05/09/15   Cherene Altes, MD   BP 113/90 mmHg  Pulse 99  Temp(Src) 98.2 F (36.8 C) (Oral)  Resp 20  Wt 205 lb (92.987 kg)  SpO2 99% Physical Exam  Constitutional: She is oriented to person, place, and time. She appears well-developed and well-nourished.  HENT:  Head: Normocephalic.  Mouth/Throat: Oropharynx is clear and moist.  Eyes: Conjunctivae are normal. Pupils are equal, round, and reactive to light.  Neck: Normal range of motion. Neck supple.  Cardiovascular: Normal rate, regular rhythm and normal heart sounds.   Pulmonary/Chest: Effort normal and breath sounds normal. No respiratory distress. She has no wheezes. She has no rales.  Abdominal: Soft. Bowel sounds are normal. She exhibits no distension.  There is no tenderness. There is no rebound.  Musculoskeletal: Normal range of motion. She exhibits no edema or tenderness.  Neurological: She is alert and oriented to person, place, and time. No cranial nerve deficit. Coordination normal.  Skin: Skin is warm and dry.  Psychiatric: She has a normal mood and affect. Her behavior is normal. Judgment and thought content normal.  Nursing note and vitals reviewed.   ED Course  Procedures (including critical care time) Labs Review Labs Reviewed  BASIC METABOLIC PANEL - Abnormal; Notable for the following:    Glucose, Bld 103 (*)    All other components within normal limits  CBC  TROPONIN I    Imaging Review Dg Chest 2 View  06/10/2015  CLINICAL DATA:  Acute chest pain, hospitalized in August 2016 for multiple blood clots, breast cancer 2010 postlumpectomy, asthma, COPD, history pneumonia and bronchitis  EXAM: CHEST  2 VIEW  COMPARISON:  05/04/2015  FINDINGS: Normal heart size, mediastinal contours, and pulmonary vascularity.  Minimal RIGHT basilar atelectasis.  Lungs otherwise clear.  No pleural effusion or pneumothorax.  Osseous structures unremarkable.  Prior cervical spine fusion.  IMPRESSION: Minimal RIGHT basilar atelectasis.   Electronically Signed   By: Lavonia Dana M.D.   On: 06/10/2015 13:08   Ct Angio Chest Pe W/cm &/or Wo Cm  06/10/2015   CLINICAL DATA:  Persistent shortness of breath. Prior pulmonary embolus. History of breast carcinoma  EXAM: CT ANGIOGRAPHY CHEST WITH CONTRAST  TECHNIQUE: Multidetector CT imaging of the chest was performed using the standard protocol during bolus administration of intravenous contrast. Multiplanar CT image reconstructions and MIPs were obtained to evaluate the vascular anatomy.  CONTRAST:  130mL OMNIPAQUE IOHEXOL 350 MG/ML SOLN  COMPARISON:  CT angiogram chest April 20, 2015; chest radiograph June 10, 2015  FINDINGS: Prior areas of pulmonary embolus have resolved. Currently there is no demonstrable  pulmonary embolus.  There is no thoracic aortic aneurysm or dissection. There is mild atherosclerotic change in aorta. Visualized great vessels appear unremarkable.  On axial slice 30 series 6, there is a 4 mm nodular opacity in the posterior segment of the right upper lobe, not appreciable on recent prior study. There is minimal bibasilar atelectasis. Lungs elsewhere clear.  Visualized thyroid appears unremarkable. There is no appreciable thoracic adenopathy. Pericardium is not thickened.  Visualized upper abdominal structures appear normal except for absence of gallbladder.  There is degenerative change in the thoracic spine. There are no blastic or lytic bone lesions.  Review of the MIP images confirms the above findings.  IMPRESSION: No demonstrable pulmonary embolus. Previous pulmonary emboli have resolved.  No edema or consolidation. There is a 4 mm nodular opacity in the posterior segment right upper lobe. Followup of this small nodular opacity should be based on Fleischner Society guidelines. If the patient is at high risk for bronchogenic carcinoma, follow-up chest CT at 1year is recommended. If the patient is at low risk, no follow-up is needed. This recommendation follows the consensus statement: Guidelines for Management of Small Pulmonary Nodules Detected on CT Scans: A Statement from the [place] as published in Radiology 2005; 237:395-400.  No appreciable thoracic adenopathy.  Gallbladder absent.   Electronically Signed   By: Lowella Grip III M.D.   On: 06/10/2015 14:50   I have personally reviewed and evaluated these images and lab results as part of my medical decision-making.   EKG Interpretation None      ED ECG REPORT I, Remedios Mckone, the attending physician, personally viewed and interpreted this ECG.   Date: 06/10/2015  EKG Time: 13:32  Rate: 77  Rhythm: normal EKG, normal sinus rhythm, unchanged from previous tracings  Axis: normal  Intervals:none  ST&T Change:  nonspecific    MDM   Final diagnoses:  None   Patricia Fischer is a 59 y.o. female here with chest pain, shortness of breath. Vitals stable, not tachy. Symptoms for 10 days and no cardiac history so trop x 1 sufficient. Consider PE but patient on xarelto. Given persistent symptoms, will get CT angio.   3:00 PM CT showed that previous PE resolved. Has incidental nodule that can be followed up outpatient. Trop neg. She saw cardiology 2 weeks ago and had some chest pain  and was thought to be partially due to stress and orthostasis. Scheduled to see Dr. Tamala Julian from cardiology in 2 months. Has hx of heart failure but doesn't appear volume overloaded and not hypoxic and CT showed no pulmonary edema. Will dc home.     Wandra Arthurs, MD 06/10/15 1501  Wandra Arthurs, MD 06/10/15 [phone]

## 2015-06-10 NOTE — ED Notes (Signed)
Pt to ed with c/o chest pain x 10 days.  Pt states she has known blood clots and was recently d/c [facility] about 1 month ago for same.  Pt states she is currently using xarelto for blood thinner.  Pt states chest pain is heavy and pressure and she feels sob with it.

## 2015-06-10 NOTE — Patient Instructions (Addendum)
Please do go to the emergency department Use the pain medicine when needed Stay hydrated and get plenty of fiber Start the new mood medicine and return in 3-4 weeks for next visit, but call sooner if needed

## 2015-06-10 NOTE — Discharge Instructions (Signed)
Continue taking xarelto and your meds.   You have an incidental nodule in your lung that you doctor can follow up with.   See your cardiologist.   Return to ER if you have worse chest pain, shortness of breath.

## 2015-06-11 DIAGNOSIS — R071 Chest pain on breathing: Secondary | ICD-10-CM | POA: Insufficient documentation

## 2015-06-11 DIAGNOSIS — F331 Major depressive disorder, recurrent, moderate: Secondary | ICD-10-CM | POA: Insufficient documentation

## 2015-06-11 DIAGNOSIS — F418 Other specified anxiety disorders: Secondary | ICD-10-CM | POA: Insufficient documentation

## 2015-06-11 NOTE — Assessment & Plan Note (Signed)
Likely set in motion with her recent serious health conditions; discussed, supportive listening provided; start SSRI, with close f/u; reasons to call, seek medical attention reviewed; she agrees

## 2015-06-11 NOTE — Assessment & Plan Note (Signed)
Continue factor Xa inhibitor

## 2015-06-11 NOTE — Assessment & Plan Note (Signed)
Managed by cardiologist; she was previously not able to maximized medicines because of hypotension; expect cardiologist to adjust medicine as pressures improve

## 2015-06-11 NOTE — Assessment & Plan Note (Signed)
Supportive listening provided; start SSRI; I will not initiate a benzo, there are warnings about use of opioids with comcomittant benzos; SSRI alone with close f/u and support

## 2015-06-11 NOTE — Assessment & Plan Note (Signed)
With subsequent PE; continue Factor Xa inhibitor

## 2015-06-11 NOTE — Assessment & Plan Note (Signed)
Monitored by cardiologist; secondary to right heart strain, saddle PE

## 2015-06-11 NOTE — Assessment & Plan Note (Addendum)
Reviewed cardiologist note in [place], read this to her; encouraged her to go to the ER to be evaluated, she declined call for ambulance; may need labs and CT scan to evaluate her ongoing chest pain; I will approve refill of pain medicine; discussed risk of controlled substances including possible unintentional overdose, especially if mixed with alcohol or other pills; typical speech given including illegal to share, even out of the goodness of her heart, always keep in the original bottle, safeguard medicine, do NOT mix with alcohol, other pain pills, "nerve" or anxiety pills, or sleeping pills; I am not obligated to approve of early refill or give new prescription if medicine is lost, stolen, or destroyed even with a police report, etc.; patient agrees with plan; controlled substance contract signed

## 2015-06-29 ENCOUNTER — Other Ambulatory Visit: Payer: Self-pay | Admitting: Unknown Physician Specialty

## 2015-06-29 NOTE — Telephone Encounter (Signed)
Please ask her to contact her CARDIOLOGIST for refills of her blood pressure medicines They are going to want to maximize the medicines that will help her heart the most, so we'll defer this group of meds to cardiology

## 2015-06-29 NOTE — Telephone Encounter (Signed)
I spoke with patient she is not taking any BP meds.

## 2015-06-29 NOTE — Telephone Encounter (Signed)
Your patient 

## 2015-06-29 NOTE — Telephone Encounter (Signed)
Left message to call.

## 2015-07-01 ENCOUNTER — Encounter: Payer: Self-pay | Admitting: Family Medicine

## 2015-07-01 ENCOUNTER — Ambulatory Visit (INDEPENDENT_AMBULATORY_CARE_PROVIDER_SITE_OTHER): Payer: Medicare Other | Admitting: Family Medicine

## 2015-07-01 ENCOUNTER — Telehealth: Payer: Self-pay | Admitting: Family Medicine

## 2015-07-01 VITALS — BP 135/85 | HR 87 | Temp 98.1°F | Wt 215.0 lb

## 2015-07-01 DIAGNOSIS — R4589 Other symptoms and signs involving emotional state: Secondary | ICD-10-CM

## 2015-07-01 DIAGNOSIS — F418 Other specified anxiety disorders: Secondary | ICD-10-CM | POA: Diagnosis not present

## 2015-07-01 DIAGNOSIS — I2692 Saddle embolus of pulmonary artery without acute cor pulmonale: Secondary | ICD-10-CM

## 2015-07-01 DIAGNOSIS — G43711 Chronic migraine without aura, intractable, with status migrainosus: Secondary | ICD-10-CM

## 2015-07-01 DIAGNOSIS — F331 Major depressive disorder, recurrent, moderate: Secondary | ICD-10-CM

## 2015-07-01 DIAGNOSIS — I5022 Chronic systolic (congestive) heart failure: Secondary | ICD-10-CM

## 2015-07-01 DIAGNOSIS — I82402 Acute embolism and thrombosis of unspecified deep veins of left lower extremity: Secondary | ICD-10-CM | POA: Diagnosis not present

## 2015-07-01 DIAGNOSIS — Z23 Encounter for immunization: Secondary | ICD-10-CM

## 2015-07-01 MED ORDER — AMITRIPTYLINE HCL 25 MG PO TABS: 25.0000 mg | ORAL_TABLET | Freq: Every day | ORAL | Status: DC

## 2015-07-01 MED ORDER — CARVEDILOL 3.125 MG PO TABS: 3.1250 mg | ORAL_TABLET | Freq: Two times a day (BID) | ORAL | Status: DC

## 2015-07-01 MED ORDER — TIZANIDINE HCL 2 MG PO TABS: 2.0000 mg | ORAL_TABLET | Freq: Every evening | ORAL | Status: DC | PRN

## 2015-07-01 NOTE — Assessment & Plan Note (Signed)
Continue Xarelto; continue pain medicine for continuing pain from the embolism; she was rescanned in the ER earlier this month, and she was told her pain is from developing scar tissue

## 2015-07-01 NOTE — Assessment & Plan Note (Signed)
Relaxation response information (from Dr. Leonidas Romberg, [place]) copied and pasted into AVS; supportive listening provided

## 2015-07-01 NOTE — Assessment & Plan Note (Signed)
Usually followed by Dr. Benson Norway (neurologist); patient requested refill of the TCA, which she says the cardiologist knows about and she took this in the hospital as well; refills provided, though I encouraged her to use the least effective amount, may contribute to weight gain

## 2015-07-01 NOTE — Assessment & Plan Note (Signed)
Supportive listening provided; we discussed stress, relaxation breathing, mindfulness, prayer, etc.

## 2015-07-01 NOTE — Telephone Encounter (Signed)
I talked with the patient; explained the 3.125 mg BID; she has a bottle of 6.25 mg and she'll hold on to that for now, as that would be the next step up as he tells her to titrate; for NOW, just 3.125 mg BID She'll call his office tomorrow to see about moving appt up

## 2015-07-01 NOTE — Assessment & Plan Note (Signed)
Patient asked about traveling; I told her I do not want her in a car seated for more than 30 minutes without taking a break to get out and stretch, move her legs, walk just a bit; prolonged sitting in a car could increase risk of new clot or extension of old clot

## 2015-07-01 NOTE — Telephone Encounter (Signed)
-----   Message from Belva Crome, MD sent at 07/01/2015  3:35 PM EDT ----- Regarding: RE: Can patient please get in to see you soon? Start carvedilol 3.1`25 mg BID now. When I see her we will further titrate for heart failure. ----- Message -----    From: Arnetha Courser, MD    Sent: 07/01/2015  10:53 AM      To: Belva Crome, MD Subject: Can patient please get in to see you soon?     Patient is here seeing me; BP has come back up 135/85 today I know that at discharge from the hospital some medicines were being considered, but her pressure wouldn't tolerate them Can you please see her soon and get her back on medications for her heart Also, she'd like to talk to you about when she can drive again Thank you so much, [name]

## 2015-07-01 NOTE — Patient Instructions (Addendum)
Please do try to get in to see Dr. Tamala Julian soon; I've sent him a note Ask him if and when you can start cardiopulmonary rehab  Avoid salt as much as possible; 1500 mg daily of sodium is probably a safe limit, certainly no more than 2,000 mg would be the max  Check out the information at familydoctor.org entitled "What It Takes to Lose Weight" Try to lose between 1-2 pounds per week by taking in fewer calories and burning off more calories You can succeed by limiting portions, limiting foods dense in calories and fat, becoming more active, and drinking 8 glasses of water a day (64 ounces) Don't skip meals, especially breakfast, as skipping meals may alter your metabolism Do not use over-the-counter weight loss pills or gimmicks that claim rapid weight loss A healthy BMI (or body mass index) is between 18.5 and 24.9 You can calculate your ideal BMI at the [place] website ClubMonetize.fr  You received the flu shot today; it should protect you against the flu virus over the coming months; it will take about two weeks for antibodies to develop; do try to stay away from hospitals, nursing homes, and daycares during peak flu season; taking 1000 mg of vitamin C daily during flu season may help you avoid getting sick  Steps to Elicit the Relaxation Response The following is the technique reprinted with permission from Dr. Billie Ruddy book The Relaxation Response pages 162-163 1. Sit quietly in a comfortable position. 2. Close your eyes. 3. Deeply relax all your muscles,  beginning at your feet and progressing up to your face.  Keep them relaxed. 4. Breathe through your nose.  Become aware of your breathing.  As you breathe out, say the word, "one"*,  silently to yourself. For example,  breathe in ... out, "one",- in .. out, "one", etc.  Breathe easily and naturally. 5. Continue for 10 to 20 minutes.  You may open your eyes to check the time, but  do not use an alarm.  When you finish, sit quietly for several minutes,  at first with your eyes closed and later with your eyes opened.  Do not stand up for a few minutes. 6. Do not worry about whether you are successful  in achieving a deep level of relaxation.  Maintain a passive attitude and permit relaxation to occur at its own pace.  When distracting thoughts occur,  try to ignore them by not dwelling upon them  and return to repeating "one."  With practice, the response should come with little effort.  Practice the technique once or twice daily,  but not within two hours after any meal,  since the digestive processes seem to interfere with  the elicitation of the Relaxation Response. * It is better to use a soothing, mellifluous sound, preferably with no meaning. or association, to avoid stimulation of unnecessary thoughts - a mantra.

## 2015-07-01 NOTE — Assessment & Plan Note (Signed)
Flu vaccine recommended, given today; see AVS

## 2015-07-01 NOTE — Assessment & Plan Note (Addendum)
Previously reviewed the discharge summary, and cardiologist/hospitalist mentioned wanting her on medications once her pressure would tolerate it; now her pressure is 135/85; I sent a personal note to her cardiologist to notify him of her pressure today so he can decide about initiating medication; also, we'll want to see if we can get her seen by him before December (she called and said that was when she was told she could come); we'll want to find out she can drive, which we'll defer to cardiologist; I also recommended she talk to him about if / when to start cardiopulmonary rehab, as she reports limited exercise tolerance; no evidence of fluid overload today; salt avoidance discussed in detail

## 2015-07-01 NOTE — Progress Notes (Signed)
BP 135/85 mmHg  Pulse 87  Temp(Src) 98.1 F (36.7 C)  Wt 215 lb (97.523 kg)  SpO2 98%   Subjective:    Patient ID: Patricia Fischer, female    DOB: [birth year], 59 y.o.   MRN: [id number]  HPI: Patricia Fischer is a 59 y.o. female  Chief Complaint  Patient presents with  . Follow-up    still having some chest pressure  . refill    she needs refills on Amitriptyline and tizanadine.  . Flu shot    She wants to know if it is safe for her to get the flu shot or if she should wait   Patient is here for follow-up She is having hte same pain; she had chest CT in the ER (she went there after our last visit); they told her it was scar tissue The pain medicine is working well for her pain; still going to the bathroom, not twice a day any more; more gas but not constipated Using about two per day of average; not getting high and not taking for effect; just for pain; not driving; explained dui risk, don't drive on med if tired; has back up Rx of the painmedicine Her appetite has come back; eating better; no fluid overload; she has to lay on a couple of pillows at night  She would like to get a flu shot  She takes tizanidine for muscle spasms, had blood clots in the legs; she also needs refill of amitriptyline, uses that for headaches and to help fall asleep; she takes 1-2 of the 25 mg pills; heart doctor knows she takes this and she took it in the hospital  Relevant past medical, surgical, family and social history reviewed and updated as indicated. Interim medical history since our last visit reviewed. Allergies and medications reviewed and updated.  Review of Systems Per HPI unless specifically indicated above     Objective:    BP 135/85 mmHg  Pulse 87  Temp(Src) 98.1 F (36.7 C)  Wt 215 lb (97.523 kg)  SpO2 98%  Wt Readings from Last 3 Encounters:  07/01/15 215 lb (97.523 kg)  06/10/15 205 lb (92.987 kg)  06/10/15 205 lb (92.987 kg)    Physical Exam  Constitutional:  She appears well-developed and well-nourished.  Weight gain noted  HENT:  Head: Normocephalic and atraumatic.  Cardiovascular: Normal rate and regular rhythm.   Pulmonary/Chest: Effort normal and breath sounds normal. No respiratory distress. She has no wheezes. She has no rales.  Abdominal: She exhibits no distension.  Musculoskeletal: She exhibits no edema.  Skin: Skin is warm and dry. No bruising and no ecchymosis noted.  Psychiatric: Her speech is normal and behavior is normal. Judgment and thought content normal. Her affect is not blunt, not labile and not inappropriate. Cognition and memory are normal. She exhibits a depressed mood (tearful when discussing her sister, not being able to drive).   Results for orders placed or performed during the hospital encounter of 08/65/78  Basic metabolic panel  Result Value Ref Range   Sodium 141 135 - 145 mmol/L   Potassium 3.5 3.5 - 5.1 mmol/L   Chloride 107 101 - 111 mmol/L   CO2 28 22 - 32 mmol/L   Glucose, Bld 103 (H) 65 - 99 mg/dL   BUN 9 6 - 20 mg/dL   Creatinine, Ser 0.80 0.44 - 1.00 mg/dL   Calcium 9.4 8.9 - 10.3 mg/dL   GFR calc non Af Amer >60 >60 mL/min  GFR calc Af Amer >60 >60 mL/min   Anion gap 6 5 - 15  CBC  Result Value Ref Range   WBC 9.0 3.6 - 11.0 K/uL   RBC 4.73 3.80 - 5.20 MIL/uL   Hemoglobin 13.7 12.0 - 16.0 g/dL   HCT 40.9 35.0 - 47.0 %   MCV 86.5 80.0 - 100.0 fL   MCH 29.0 26.0 - 34.0 pg   MCHC 33.5 32.0 - 36.0 g/dL   RDW 14.1 11.5 - 14.5 %   Platelets 181 150 - 440 K/uL  Troponin I  Result Value Ref Range   Troponin I <0.03 <0.031 ng/mL      Assessment & Plan:   Problem List Items Addressed This Visit      Cardiovascular and Mediastinum   Chronic migraine without aura, with intractable migraine, so stated, with status migrainosus    Usually followed by Dr. Benson Fischer (neurologist); patient requested refill of the TCA, which she says the cardiologist knows about and she took this in the hospital as well;  refills provided, though I encouraged her to use the least effective amount, may contribute to weight gain      Relevant Medications   tiZANidine (ZANAFLEX) 2 MG tablet   amitriptyline (ELAVIL) 25 MG tablet   Chronic systolic CHF (congestive heart failure) (HCC)    Previously reviewed the discharge summary, and cardiologist/hospitalist mentioned wanting her on medications once her pressure would tolerate it; now her pressure is 135/85; I sent a personal note to her cardiologist to notify him of her pressure today so he can decide about initiating medication; also, we'll want to see if we can get her seen by him before December (she called and said that was when she was told she could come); we'll want to find out she can drive, which we'll defer to cardiologist; I also recommended she talk to him about if / when to start cardiopulmonary rehab, as she reports limited exercise tolerance; no evidence of fluid overload today; salt avoidance discussed in detail      Left leg DVT ([place])    Patient asked about traveling; I told her I do not want her in a car seated for more than 30 minutes without taking a break to get out and stretch, move her legs, walk just a bit; prolonged sitting in a car could increase risk of new clot or extension of old clot      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      Relevant Medications   amitriptyline (ELAVIL) 25 MG tablet   Anxiety about health    Relaxation response information (from Dr. Leonidas Fischer, [place]) copied and pasted into AVS; supportive listening provided      Relevant Medications   amitriptyline  (ELAVIL) 25 MG tablet   Needs flu shot    Flu vaccine recommended, given today; see AVS       Other Visit Diagnoses    Encounter for immunization          Follow up plan: Return in about 1 month (around 08/01/2015) for follow-up.  An after-visit summary was printed and given to the  patient at [place].  Please see the patient instructions which may contain other information and recommendations beyond what is mentioned above in the assessment and plan.  Face-to-face time with patient was more than 25 minutes, >50% time spent counseling and coordination of care

## 2015-07-03 ENCOUNTER — Other Ambulatory Visit: Payer: Self-pay

## 2015-07-03 ENCOUNTER — Other Ambulatory Visit: Payer: Self-pay | Admitting: Family Medicine

## 2015-07-03 MED ORDER — TIZANIDINE HCL 2 MG PO TABS: 2.0000 mg | ORAL_TABLET | Freq: Every evening | ORAL | Status: DC | PRN

## 2015-07-03 NOTE — Telephone Encounter (Signed)
LAST VISIT: 12/20/2014 PRACTICE PARTNER: [id number]

## 2015-07-03 NOTE — Telephone Encounter (Signed)
I just saw this patient this week; I think she must have duplicate charts; please send request in the other chart and notify Alwyn Ren to ask for chart merge

## 2015-07-03 NOTE — Telephone Encounter (Signed)
OTHER CHART FROM PATIENT THAT HAS DUPLICATES. Routed the message to [facility] for a merge.

## 2015-07-08 ENCOUNTER — Other Ambulatory Visit: Payer: Self-pay | Admitting: Unknown Physician Specialty

## 2015-07-08 NOTE — Telephone Encounter (Signed)
DENIED Let patient know Rx for all BP medicines need to come through cardiologist because he is most likely NOT going to use this losartan/hctz, but will likely taper up the carvedilol; we will leave that to cardiology

## 2015-07-08 NOTE — Telephone Encounter (Signed)
Left message to call.

## 2015-07-08 NOTE — Telephone Encounter (Signed)
Your patient.  Thanks 

## 2015-07-08 NOTE — Telephone Encounter (Signed)
I spoke with patient, she said she is no longer taking this. States it was probably an automatic refill request.

## 2015-07-10 ENCOUNTER — Telehealth: Payer: Self-pay | Admitting: Family Medicine

## 2015-07-10 NOTE — Telephone Encounter (Signed)
Left message to call.

## 2015-07-10 NOTE — Telephone Encounter (Signed)
pts [employer] nurse called and wanted to speak provider about chronic heart disease.

## 2015-07-13 NOTE — Telephone Encounter (Signed)
Left message to call, she will be out of the office until Tuesday.

## 2015-07-17 NOTE — Telephone Encounter (Signed)
Jenni from [facility] called back. They just need her last ejection fraction and if she has a diagnosis of chronic heart failure. She said I could fax the info to [phone].

## 2015-07-17 NOTE — Telephone Encounter (Signed)
I faxed requested info to [place] at [facility].

## 2015-07-25 ENCOUNTER — Other Ambulatory Visit: Payer: Self-pay | Admitting: Family Medicine

## 2015-07-25 NOTE — Telephone Encounter (Signed)
We would really prefer for Dr. Tamala Julian to be managing her carvedilol; this was prescribed under his instruction when I saw her last month, but I don't want for BOTH of [place] to be prescribing, as he will likely want to titrate this; it's my experience that patients get confused and wrong doses can get sent in with multiple prescribers AMY -- Please call his office Monday and ask if he will please take over the carvedilol; let him know patient was just started on 3.125 mg BID last month; we'll kindly defer to him to prescribe, monitor, and adjust

## 2015-08-04 ENCOUNTER — Ambulatory Visit (INDEPENDENT_AMBULATORY_CARE_PROVIDER_SITE_OTHER): Payer: Medicare Other | Admitting: Family Medicine

## 2015-08-04 ENCOUNTER — Encounter: Payer: Self-pay | Admitting: Family Medicine

## 2015-08-04 VITALS — BP 115/78 | HR 87 | Temp 98.3°F | Wt 216.0 lb

## 2015-08-04 DIAGNOSIS — I82592 Chronic embolism and thrombosis of other specified deep vein of left lower extremity: Secondary | ICD-10-CM

## 2015-08-04 DIAGNOSIS — Z79899 Other long term (current) drug therapy: Secondary | ICD-10-CM

## 2015-08-04 DIAGNOSIS — R635 Abnormal weight gain: Secondary | ICD-10-CM | POA: Diagnosis not present

## 2015-08-04 DIAGNOSIS — I5022 Chronic systolic (congestive) heart failure: Secondary | ICD-10-CM | POA: Diagnosis not present

## 2015-08-04 DIAGNOSIS — IMO0001 Reserved for inherently not codable concepts without codable children: Secondary | ICD-10-CM

## 2015-08-04 MED ORDER — HYDROCODONE-ACETAMINOPHEN 5-325 MG PO TABS: 1.0000 | ORAL_TABLET | Freq: Four times a day (QID) | ORAL | Status: DC | PRN

## 2015-08-04 MED ORDER — TIZANIDINE HCL 2 MG PO TABS: 2.0000 mg | ORAL_TABLET | Freq: Every evening | ORAL | Status: DC | PRN

## 2015-08-04 NOTE — Progress Notes (Signed)
BP 115/78 mmHg  Pulse 87  Temp(Src) 98.3 F (36.8 C)  Wt 216 lb (97.977 kg)  SpO2 98%   Subjective:    Patient ID: Patricia Fischer, female    DOB: 10/20/1955, 59 y.o.   MRN: [id number]  HPI: Patricia Fischer is a 59 y.o. female  Chief Complaint  Patient presents with  . Follow-up    1 month recheck, patient is doing better. She would like a refill on the Norco.   She says she is getting better; her chest doesn't hurt as badly Has more energy Her legs are killing me she says; her calves are still hurting; taking medicine for cramps; was told she still has blood clots in her legs; they wake her up at night; she takes the pain pill and it brings the pain down; just temporary Her left side of her neck is sore; hurts to turn the head; paranoid that everything she feels is a blood clot She found out that her grandfather had a blood clot that went to his brain (paternal GF) and it killed him; he was in his 61s The pain medicine does not make her feel loopy or goofy or overmedicated; she does not take unless absolutely necessary and can't stand pain; she knows to not mix with alcohol or other pain pills She does not have any issues with constipation She had a little constipation when she first came home from the hospital, but now not taking often enough; she takes one every night, sometimes two per day if she has walked a lot, then takes the one at night and one during the day; prolonged standing makes it worse No swelling in the ankles She was given tizanidine in the hospital and it helps the leg cramps but wonders if okay to take two pills at a time when cramps are bad She has not seen a vascular doctor but says she can't afford to see any other specialsits; she still has hospital bills  She never filled the escitalopram; did not have the money; more comfortable by herself at home; still goes to church; no thoughts of self-harm4  Relevant past medical, surgical, family and social  history reviewed and updated as indicated. Interim medical history since our last visit reviewed. Allergies and medications reviewed and updated.  Review of Systems  Per HPI unless specifically indicated above     Objective:    BP 115/78 mmHg  Pulse 87  Temp(Src) 98.3 F (36.8 C)  Wt 216 lb (97.977 kg)  SpO2 98%  Wt Readings from Last 3 Encounters:  08/04/15 216 lb (97.977 kg)  07/01/15 215 lb (97.523 kg)  06/10/15 205 lb (92.987 kg)  body mass index is 37.06 kg/(m^2).  Physical Exam  Constitutional: She appears well-developed and well-nourished.  Weight gain noted, 1 pound since last visit  HENT:  Head: Normocephalic and atraumatic.  Cardiovascular: Normal rate and regular rhythm.   Pulmonary/Chest: Effort normal and breath sounds normal. No respiratory distress. She has no wheezes. She has no rales.  Abdominal: She exhibits no distension.  Musculoskeletal: She exhibits no edema.  Skin: Skin is warm and dry. No bruising and no ecchymosis noted.  Psychiatric: Her speech is normal and behavior is normal. Judgment and thought content normal. Her mood appears not anxious. Her affect is not blunt, not labile and not inappropriate. Cognition and memory are normal. She does not exhibit a depressed mood.   Results for orders placed or performed during the hospital encounter of 06/10/15  Basic metabolic panel  Result Value Ref Range   Sodium 141 135 - 145 mmol/L   Potassium 3.5 3.5 - 5.1 mmol/L   Chloride 107 101 - 111 mmol/L   CO2 28 22 - 32 mmol/L   Glucose, Bld 103 (H) 65 - 99 mg/dL   BUN 9 6 - 20 mg/dL   Creatinine, Ser 0.80 0.44 - 1.00 mg/dL   Calcium 9.4 8.9 - 10.3 mg/dL   GFR calc non Af Amer >60 >60 mL/min   GFR calc Af Amer >60 >60 mL/min   Anion gap 6 5 - 15  CBC  Result Value Ref Range   WBC 9.0 3.6 - 11.0 K/uL   RBC 4.73 3.80 - 5.20 MIL/uL   Hemoglobin 13.7 12.0 - 16.0 g/dL   HCT 40.9 35.0 - 47.0 %   MCV 86.5 80.0 - 100.0 fL   MCH 29.0 26.0 - 34.0 pg   MCHC  33.5 32.0 - 36.0 g/dL   RDW 14.1 11.5 - 14.5 %   Platelets 181 150 - 440 K/uL  Troponin I  Result Value Ref Range   Troponin I <0.03 <0.031 ng/mL      Assessment & Plan:   Problem List Items Addressed This Visit      Cardiovascular and Mediastinum   Chronic systolic CHF (congestive heart failure) ([place]) - Primary    She will see her cardiologist soon; titration of the carvedilol will respectfully be left up to cardiology; avoid salt; she is already limiting sodium      Left leg DVT ([place])    With ongoing pain (left and chest); offered referral to vascular doctor for evaluation, consideration of procedure which may relieve pain and congestion; she declined at this time, but is welcome to call for appt if desired; pain medicine prescribed with cautions        Other   Obesity, Class II, BMI 35-39.9, with comorbidity ([place])    Encouraged patient to watch diet; will check TSH if weight gain continues without evidence of weight being related to fluid overload      Abnormal weight gain    Clinically, she does not appear fluid-overloaded; I believe her weight gain is excess calories and not enough energy expenditure; however, with recent illness, consider thyroid etiology; if weight gain continues without evidece of overt heart failure as reason, will recheck TSH      Controlled substance agreement signed    Controlled substance contract was signed June 10, 2015; [facility] [place] [facility]; reviewed again reasons to call, red flags to suggest taking for effect instead of just pain; no concern at this time for misuse or diversion; I am hopeful that as time passes, her pain related to her blood clots will improve/resolve         Follow up plan: Return in about 1 month (around 09/03/2015).  An after-visit summary was printed and given to the patient at [place].  Please see the patient instructions which may contain other information and recommendations beyond what is mentioned  above in the assessment and plan.  Meds ordered this encounter  Medications  . tiZANidine (ZANAFLEX) 2 MG tablet    Sig: Take 1-2 tablets (2-4 mg total) by mouth at bedtime as needed for muscle spasms.    Dispense:  45 tablet    Refill:  1  . HYDROcodone-acetaminophen (NORCO/VICODIN) 5-325 MG tablet    Sig: Take 1 tablet by mouth every 6 (six) hours as needed for moderate pain. Do  NOT take with tramadol    Dispense:  60 tablet    Refill:  0

## 2015-08-04 NOTE — Patient Instructions (Addendum)
There is an open invitation for you to see a vascular specialist if you change your mind Let me know if your weight continues to climb and we can recheck your thyroid Try some gentle stretches Work with your heart doctor in regards to your carvedilol Do get plenty of fiber and water to help prevent constipation Never ever mix with other pain medicines or nerve pills or sleeping pills Return in 2 months, but let me know if refill needed and I'll approve before next appointment

## 2015-08-05 ENCOUNTER — Telehealth: Payer: Self-pay

## 2015-08-05 NOTE — Telephone Encounter (Signed)
She thinks she picked up something while she was here yesterday. She woke up sick today, awful cough, some fever. She can't drive and she has no way to come in for an appt. She wants to know if you can call her in something and some Tussionex.

## 2015-08-05 NOTE — Telephone Encounter (Signed)
Patient notified, she states if she can get some type of transportation, she'll call and see if she can get an appt.

## 2015-08-05 NOTE — Telephone Encounter (Signed)
Called patient. No answer, no answering service.  Will keep try again later.

## 2015-08-05 NOTE — Telephone Encounter (Signed)
She can NOT take Tussionex with her other medicines There would be a risk of overdose which can be fatal I won't call in antibiotics without seeing her, so we don't have that option Most things that people get are viral, but it's hard to know without exam I'll recommend rest, hydration, vitamin C, green tea, cough drops like Hall's, honey, echinacea If she feels badly (fever, body aches), do encourage her to go to urgent care right away; we have already seen flu in the community

## 2015-08-08 DIAGNOSIS — Z79899 Other long term (current) drug therapy: Secondary | ICD-10-CM | POA: Insufficient documentation

## 2015-08-08 DIAGNOSIS — R635 Abnormal weight gain: Secondary | ICD-10-CM | POA: Insufficient documentation

## 2015-08-08 NOTE — Assessment & Plan Note (Signed)
Controlled substance contract was signed June 10, 2015; [facility] [place] [facility]; reviewed again reasons to call, red flags to suggest taking for effect instead of just pain; no concern at this time for misuse or diversion; I am hopeful that as time passes, her pain related to her blood clots will improve/resolve

## 2015-08-08 NOTE — Assessment & Plan Note (Signed)
Encouraged patient to watch diet; will check TSH if weight gain continues without evidence of weight being related to fluid overload

## 2015-08-08 NOTE — Assessment & Plan Note (Signed)
She will see her cardiologist soon; titration of the carvedilol will respectfully be left up to cardiology; avoid salt; she is already limiting sodium

## 2015-08-08 NOTE — Assessment & Plan Note (Signed)
With ongoing pain (left and chest); offered referral to vascular doctor for evaluation, consideration of procedure which may relieve pain and congestion; she declined at this time, but is welcome to call for appt if desired; pain medicine prescribed with cautions

## 2015-08-08 NOTE — Assessment & Plan Note (Signed)
Clinically, she does not appear fluid-overloaded; I believe her weight gain is excess calories and not enough energy expenditure; however, with recent illness, consider thyroid etiology; if weight gain continues without evidece of overt heart failure as reason, will recheck TSH

## 2015-08-18 ENCOUNTER — Ambulatory Visit: Payer: Self-pay | Admitting: Neurology

## 2015-08-18 ENCOUNTER — Ambulatory Visit: Payer: Medicare Other | Admitting: Neurology

## 2015-08-19 ENCOUNTER — Other Ambulatory Visit: Payer: Self-pay | Admitting: Family Medicine

## 2015-08-19 NOTE — Telephone Encounter (Signed)
Pt would like a refill on hydrocodone.   

## 2015-08-19 NOTE — Telephone Encounter (Signed)
Routing to provider  

## 2015-08-20 NOTE — Telephone Encounter (Signed)
Patient appears to be accelerating her hydrocodone use I will call her to discuss and limit her use

## 2015-08-21 MED ORDER — TRAMADOL HCL 50 MG PO TABS: ORAL_TABLET | ORAL | Status: DC

## 2015-08-21 NOTE — Telephone Encounter (Signed)
Patient says she broke a tooth and can't have anything done until she stops her blood thinners She has been having a lot of chest pains and leg aches She has talked to the heart doctor; they think it's still just scar tissue, will see him soon I explained my concern about accelerating pain medicine use She is fine to back down to tramadol and is okay with gentle taper We'll decrease to two tramadol a day for 3 weeks, then one a day after that and see how she does Discussed other methods of pain control [facility]

## 2015-08-24 NOTE — Progress Notes (Signed)
Cardiology Office Note   Date:  08/25/2015   ID:  Patricia Fischer, DOB 09-May-1956, MRN [id number]  PCP:  [name], MD  Cardiologist:  Sinclair Grooms, MD   Chief Complaint  Patient presents with  . Congestive Heart Failure      History of Present Illness: Patricia Fischer is a 59 y.o. female who presents for f/o of saddle PE, acute on chronic systolic HF, anxiety disorder, hypertension, and elevated troponin during PE.   she is doing relatively well. She looks much better than previous. Shortness of breath is dramatically improved. She has had 2 episodes of orthostatic near-syncope/syncope sent September. The most recent episode was greater than a month ago. She had an echocardiogram this morning. She is on low-dose beta blocker therapy. Her pressures have been too low to try ARB therapy.   Occasional exertional chest discomfort.  Past Medical History  Diagnosis Date  . Essential hypertension   . Asthma   . Environmental allergies   . Pneumonia 2012  . Blood transfusion [place]  . GERD (gastroesophageal reflux disease)   . Depression   . Breast cancer, left breast ([place]) 2011    DCIS  . Head injury 2011  . Anxiety   . Hemorrhoids   . Obese   . Chronic migraine without aura, with intractable migraine, so stated, with status migrainosus March 2016  . Chronic systolic CHF (congestive heart failure) ([place]) 05/05/2015  . Syncope Aug 2016  . Orthostatic hypotension Aug. 2016  . Pulmonary emboli ([place]) Aug. 2016  . Troponin level elevated Aug. 2016  . Acute systolic heart failure [facility]) Aug 2016    Past Surgical History  Procedure Laterality Date  . Breast surgery Left 2011    Breast lumpectomy  . Cardiac catheterization  2010    [facility]; pt states it was "clear"  . Tonsillectomy    . Appendectomy    . Cholecystectomy    . Tubal ligation    . Anal fissure repair    . Knee cartilage surgery Left   . Carpal tunnel release Bilateral   .  Tarsal tunnel release Left 1990  . Anterior cervical decomp/discectomy fusion  01/23/2012    Procedure: ANTERIOR CERVICAL DECOMPRESSION/DISCECTOMY FUSION 1 LEVEL;  Surgeon: Elaina Hoops, MD;  Location: [place] [facility];  Service: Neurosurgery;  Laterality: N/A;  Cervical five-six anterior cervical discectomy fusion with plating  . Cesarean section  1982, 1979  . Abdominal hysterectomy  1986  . Colonoscopy  01/16/2009    Dr Bary Castilla  . Shoulder arthroscopy with subacromial decompression, rotator cuff repair and bicep tendon repair Right 11/22/2013    Procedure: RIGHT SHOULDER ATHROSCOPY OPEN SUBSCAP REPAIR DELTA-PECTORAL ;  Surgeon: Augustin Schooling, MD;  Location: [place];  Service: Orthopedics;  Laterality: Right;     Current Outpatient Prescriptions  Medication Sig Dispense Refill  . albuterol (PROVENTIL HFA;VENTOLIN HFA) 108 (90 BASE) MCG/ACT inhaler Inhale 2 puffs into the lungs every 6 (six) hours as needed for shortness of breath.     Marland Kitchen amitriptyline (ELAVIL) 25 MG tablet Take 1-2 tablets (25-50 mg total) by mouth at bedtime. 60 tablet 2  . atorvastatin (LIPITOR) 40 MG tablet Take 1 tablet (40 mg total) by mouth daily at 6 PM. 30 tablet 5  . Blood Pressure Monitoring (BLOOD PRESSURE CUFF) MISC 1 Device by Does not apply route once. 1 each 0  . carvedilol (COREG) 3.125 MG tablet TAKE 1 TABLET (3.125 MG  TOTAL) BY MOUTH 2 (TWO) TIMES DAILY WITH A MEAL. 60 tablet 0  . fluticasone (FLONASE) 50 MCG/ACT nasal spray Place 1 spray into both nostrils daily as needed for allergies or rhinitis.   11  . ondansetron (ZOFRAN ODT) 4 MG disintegrating tablet Take 1 tablet (4 mg total) by mouth every 8 (eight) hours as needed for nausea or vomiting. 30 tablet 2  . rivaroxaban (XARELTO) 20 MG TABS tablet Take 1 tablet (20 mg total) by mouth daily with supper. 30 tablet 0  . tiZANidine (ZANAFLEX) 2 MG tablet Take 1-2 tablets (2-4 mg total) by mouth at bedtime as needed for muscle spasms. 45 tablet 1  . traMADol  (ULTRAM) 50 MG tablet One by mouth twice a day for three weeks, then one a day if needed for pain 50 tablet 0   No current facility-administered medications for this visit.    Allergies:   Gadolinium derivatives; Sulfa antibiotics; Aripiprazole; Codeine; Oxycodone; and Penicillins    Social History:  The patient  reports that she has never smoked. She has never used smokeless tobacco. She reports that she does not drink alcohol or use illicit drugs.   Family History:  The patient's family history includes Aneurysm in her paternal grandfather; Breast cancer in her mother and paternal aunt; COPD in her father and paternal grandfather; Cancer in her father and mother; Heart attack in her paternal grandmother; Heart disease in her paternal grandmother; Hypertension in her mother; Migraines in her mother; Rectal cancer in her father; Stroke in her paternal grandmother. There is no history of Anesthesia problems or Diabetes.    ROS:  Please see the history of present illness.   Otherwise, review of systems are positive for  Cough, muscle pain, numbness and burning in her feet, headaches, wheezing, orthostatic dizziness..   All other systems are reviewed and negative.    PHYSICAL EXAM: VS:  BP 138/88 mmHg  Pulse 73  Ht 5\' 4"  (1.626 m)  Wt 217 lb 6.4 oz (98.612 kg)  BMI 37.30 kg/m2  SpO2 98% , BMI Body mass index is 37.3 kg/(m^2). GEN: Well nourished, well developed, in no acute distress HEENT: normal Neck: no JVD, carotid bruits, or masses Cardiac: RRR.  There is no murmur, rub, or gallop. There is no edema.  She does have varicosities. Respiratory:  clear to auscultation bilaterally, normal work of breathing. GI: soft, nontender, nondistended, + BS MS: no deformity or atrophy Skin: warm and dry, no rash Neuro:  Strength and sensation are intact Psych: euthymic mood, full affect   EKG:  EKG  Is not ordered today.   Recent Labs: 04/20/2015: B Natriuretic Peptide 459.6* 05/05/2015:  Magnesium 2.2; TSH 2.309 05/15/2015: ALT 15 06/10/2015: BUN 9; Creatinine, Ser 0.80; Hemoglobin 13.7; Platelets 181; Potassium 3.5; Sodium 141    Lipid Panel    Component Value Date/Time   CHOL 140 04/20/2015 0244   TRIG 164* 04/20/2015 0244   HDL 29* 04/20/2015 0244   CHOLHDL 4.8 04/20/2015 0244   VLDL 33 04/20/2015 0244   LDLCALC 78 04/20/2015 0244      Wt Readings from Last 3 Encounters:  08/25/15 217 lb 6.4 oz (98.612 kg)  08/04/15 216 lb (97.977 kg)  07/01/15 215 lb (97.523 kg)      Other studies Reviewed: Additional studies/ records that were reviewed today include:  Laboratory data.. The findings include  Awaiting recent echo performed earlier today..    ASSESSMENT AND PLAN:  1. Acute saddle pulmonary embolism with acute cor  pulmonale ([place])  significant clinical improvement  2. Acute systolic HF (heart failure) (HCC)  based upon exam and increase in blood pressure, I am assuming that there is improvement in LV function.  3. Elevated troponin  no documentation of coronary disease  4. Essential hypertension  blood pressure is reasonably controlled today with borderline diastolic  5. Recurrent syncope, orthostatic in nature Support stockings are recommended  Current medicines are reviewed at length with the patient today.  The patient has the following concerns regarding medicines:  She asked me questions concerning continuation of opiate therapy. I will leave this to primary care.  The following changes/actions have been instituted:     Support stockings , knee-high moderate tension.   Increase carvedilol to 6.25 mg twice a day   Echocardiogram will determine further management strategy. ARB/ACE therapy may now be possible   No driving, due to recurring syncope.  Labs/ tests ordered today include:  No orders of the defined types were placed in this encounter.     Disposition:   FU with HS in 3 month  Signed, Sinclair Grooms, MD  08/25/2015 10:39  AM    [facility] [place], [place], [place]  [address] Phone: [phone]; Fax: [phone]

## 2015-08-25 ENCOUNTER — Other Ambulatory Visit (HOSPITAL_COMMUNITY): Payer: Medicare Other | Admitting: *Deleted

## 2015-08-25 ENCOUNTER — Encounter: Payer: Self-pay | Admitting: Interventional Cardiology

## 2015-08-25 ENCOUNTER — Ambulatory Visit (HOSPITAL_COMMUNITY): Payer: Medicare Other | Attending: Internal Medicine

## 2015-08-25 ENCOUNTER — Other Ambulatory Visit (INDEPENDENT_AMBULATORY_CARE_PROVIDER_SITE_OTHER): Payer: Medicare Other | Admitting: *Deleted

## 2015-08-25 ENCOUNTER — Ambulatory Visit (INDEPENDENT_AMBULATORY_CARE_PROVIDER_SITE_OTHER): Payer: Medicare Other | Admitting: Interventional Cardiology

## 2015-08-25 ENCOUNTER — Other Ambulatory Visit: Payer: Self-pay

## 2015-08-25 VITALS — BP 138/88 | HR 73 | Ht 64.0 in | Wt 217.4 lb

## 2015-08-25 DIAGNOSIS — R7989 Other specified abnormal findings of blood chemistry: Secondary | ICD-10-CM

## 2015-08-25 DIAGNOSIS — G43711 Chronic migraine without aura, intractable, with status migrainosus: Secondary | ICD-10-CM

## 2015-08-25 DIAGNOSIS — R748 Abnormal levels of other serum enzymes: Secondary | ICD-10-CM | POA: Diagnosis not present

## 2015-08-25 DIAGNOSIS — R55 Syncope and collapse: Secondary | ICD-10-CM | POA: Diagnosis not present

## 2015-08-25 DIAGNOSIS — I272 Other secondary pulmonary hypertension: Secondary | ICD-10-CM

## 2015-08-25 DIAGNOSIS — I5021 Acute systolic (congestive) heart failure: Secondary | ICD-10-CM

## 2015-08-25 DIAGNOSIS — I5022 Chronic systolic (congestive) heart failure: Secondary | ICD-10-CM | POA: Diagnosis not present

## 2015-08-25 DIAGNOSIS — I10 Essential (primary) hypertension: Secondary | ICD-10-CM | POA: Diagnosis not present

## 2015-08-25 DIAGNOSIS — I340 Nonrheumatic mitral (valve) insufficiency: Secondary | ICD-10-CM | POA: Insufficient documentation

## 2015-08-25 DIAGNOSIS — I447 Left bundle-branch block, unspecified: Secondary | ICD-10-CM | POA: Diagnosis not present

## 2015-08-25 DIAGNOSIS — R778 Other specified abnormalities of plasma proteins: Secondary | ICD-10-CM

## 2015-08-25 DIAGNOSIS — I2602 Saddle embolus of pulmonary artery with acute cor pulmonale: Secondary | ICD-10-CM

## 2015-08-25 DIAGNOSIS — R079 Chest pain, unspecified: Secondary | ICD-10-CM | POA: Diagnosis not present

## 2015-08-25 DIAGNOSIS — R790 Abnormal level of blood mineral: Secondary | ICD-10-CM

## 2015-08-25 LAB — LIPID PANEL
Cholesterol: 111 mg/dL — ABNORMAL LOW (ref 125–200)
HDL: 44 mg/dL — ABNORMAL LOW (ref >=46)
LDL CALC: 50 mg/dL (ref <130)
TRIGLYCERIDES: 87 mg/dL (ref <150)
Total CHOL/HDL Ratio: 2.5 Ratio (ref <=5.0)
VLDL: 17 mg/dL (ref <30)

## 2015-08-25 LAB — HEPATIC FUNCTION PANEL
ALBUMIN: 3.9 g/dL (ref 3.6–5.1)
ALT: 13 U/L (ref 6–29)
AST: 19 U/L (ref 10–35)
Alkaline Phosphatase: 94 U/L (ref 33–130)
BILIRUBIN INDIRECT: 0.3 mg/dL (ref 0.2–1.2)
BILIRUBIN TOTAL: 0.4 mg/dL (ref 0.2–1.2)
Bilirubin, Direct: 0.1 mg/dL (ref <=0.2)
TOTAL PROTEIN: 7.1 g/dL (ref 6.1–8.1)

## 2015-08-25 MED ORDER — PERFLUTREN LIPID MICROSPHERE
1.0000 mL | INTRAVENOUS | Status: AC | PRN
  Administered 2015-08-25: 1 mL via INTRAVENOUS

## 2015-08-25 MED ORDER — CARVEDILOL 6.25 MG PO TABS: 6.2500 mg | ORAL_TABLET | Freq: Two times a day (BID) | ORAL | Status: DC

## 2015-08-25 NOTE — Addendum Note (Signed)
Addended by: Eulis Foster on: 08/25/2015 08:54 AM   Modules accepted: Orders

## 2015-08-25 NOTE — Addendum Note (Signed)
Addended by: Eulis Foster on: 08/25/2015 07:45 AM   Modules accepted: Orders

## 2015-08-25 NOTE — Patient Instructions (Signed)
Medication Instructions:  Your physician has recommended you make the following change in your medication:  INCREASE Carvedilol to 6.25mg  Twice daily  Labwork: None ordered  Testing/Procedures: We will call you with your echo results  Follow-Up: Your physician recommends that you schedule a follow-up appointment in: 3 months    Any Other Special Instructions Will Be Listed Below (If Applicable). NO Driving  Please purchase knee high moderate tension support stockings and wear them daily.      If you need a refill on your cardiac medications before your next appointment, please call your pharmacy.

## 2015-08-27 ENCOUNTER — Other Ambulatory Visit: Payer: Self-pay

## 2015-08-27 ENCOUNTER — Telehealth: Payer: Self-pay

## 2015-08-27 DIAGNOSIS — E785 Hyperlipidemia, unspecified: Secondary | ICD-10-CM

## 2015-08-27 MED ORDER — ATORVASTATIN CALCIUM 20 MG PO TABS: 20.0000 mg | ORAL_TABLET | Freq: Every day | ORAL | Status: DC

## 2015-08-27 NOTE — Telephone Encounter (Signed)
Pt aware of lab results and Dr.Smith's recommendation with verbal understanding. Lipids are excellent. The liver tests are normal. Decrease atorvastatin to 20 mg daily. Repeat liver and lipid panel in 6 months Pt rqst an Rx for Atorvastatin 20mg  [facility]

## 2015-08-27 NOTE — Telephone Encounter (Signed)
-----   Message from Belva Crome, MD sent at 08/26/2015  6:00 PM EST ----- Lipids are excellent. The liver tests are normal. Decrease atorvastatin to 20 mg daily. Repeat liver and lipid panel in 6 months

## 2015-08-28 ENCOUNTER — Telehealth: Payer: Self-pay | Admitting: Interventional Cardiology

## 2015-08-28 NOTE — Telephone Encounter (Signed)
Called patient, she is aware that the echo has not been reviewed by Dr. Tamala Julian at this time. Patient stated she is fine waiting for these results until next week.

## 2015-08-28 NOTE — Telephone Encounter (Signed)
New message      Calling to get echo results.  Ok to call next week since we are closing early today

## 2015-09-02 ENCOUNTER — Other Ambulatory Visit: Payer: Self-pay | Admitting: Family Medicine

## 2015-09-02 DIAGNOSIS — I82592 Chronic embolism and thrombosis of other specified deep vein of left lower extremity: Secondary | ICD-10-CM

## 2015-09-02 DIAGNOSIS — I2692 Saddle embolus of pulmonary artery without acute cor pulmonale: Secondary | ICD-10-CM

## 2015-09-02 NOTE — Telephone Encounter (Signed)
Please call neurologist and confirm; we are always hopeful that the specialists were refer patients to will address the patients needs and refill medicines for which they are being seen; if they have an issue with that, please ask them to have their provider speak with me We'll refer her to the pain clinic

## 2015-09-02 NOTE — Assessment & Plan Note (Signed)
With chronic pain; refer to pain clinic

## 2015-09-02 NOTE — Assessment & Plan Note (Signed)
With chronic pain, refer to pain clinic

## 2015-09-02 NOTE — Telephone Encounter (Signed)
Patient says Neuro told her to have you refill it since she hasn't seen him since the summer.  Also she said Tramadol is not helping her leg pain, states the Hydrocodone was helping it. She wants to know if you could write it again or if she needs to go to a pain clinic?

## 2015-09-02 NOTE — Telephone Encounter (Signed)
Patient was referred to neurologist for chronic migraine back in July; I'd really like for the neurologist to take over this prescription please; thank you

## 2015-09-03 ENCOUNTER — Telehealth: Payer: Self-pay | Admitting: Interventional Cardiology

## 2015-09-03 ENCOUNTER — Telehealth: Payer: Self-pay | Admitting: Neurology

## 2015-09-03 ENCOUNTER — Other Ambulatory Visit: Payer: Self-pay | Admitting: Family Medicine

## 2015-09-03 NOTE — Telephone Encounter (Signed)
I spoke to patient and she is aware of message below. I advised her that she may not hear from Dr. Jannifer Fischer until Tuesday, she voiced understanding.

## 2015-09-03 NOTE — Telephone Encounter (Signed)
Dr. Delight Ovens office is calling to get a new Rx called in for medication amitriptyline (ELAVIL) 25 MG tablet for the patient. Dr. Sanda Klein 's office says Dr. Sanda Klein thinks the patient's neurologist should prescribe this medication. [facility] [place] If there are any questions please call the patient. Thank you.

## 2015-09-03 NOTE — Telephone Encounter (Signed)
PT  AWARE APPEARS  ECHO  HAS  NOT  BEEN REVIEWED  AT THIS TIME.  PRELIMINARY  ECHO  RESULTS  EF  IS  STABLE AND   PT  AWARE.   WILL  CALL ONCE  DR  Granjeno

## 2015-09-03 NOTE — Telephone Encounter (Signed)
I called back and spoke with the patient.  Says Dr Sanda Klein feels our office should write Rx for Amitriptyline at this point.  Patient indicates she is now taking 25mg  three at bedtime.  States she has enough meds to last through tonight.  Dr Jannifer Franklin is out of the office.  Forwarding message to [facility] for review.  Okay to update Rx and send?  Please advise.  Thank you!

## 2015-09-03 NOTE — Telephone Encounter (Signed)
New Message  Pt calling for echo results/ Please call back and discuss.

## 2015-09-03 NOTE — Telephone Encounter (Signed)
Patient notified, advised her that the pain clinic may take a couple of weeks to get in. Also, that Dr. Jannifer Franklin office will call her in regards to her Amitriptyline rx.

## 2015-09-03 NOTE — Telephone Encounter (Signed)
Since not previously prescribed by Dr. Jannifer Franklin, I would like for him to okay this.

## 2015-09-03 NOTE — Telephone Encounter (Signed)
I spoke with Dr.Willis's office. They did not see anywhere in her chart that says he refuses to refill it. They went ahead and sent him the refill request and they will let patient know when it is done.

## 2015-09-04 MED ORDER — LOSARTAN POTASSIUM 50 MG PO TABS: 50.0000 mg | ORAL_TABLET | Freq: Every day | ORAL | Status: DC

## 2015-09-04 NOTE — Telephone Encounter (Signed)
I just refilled two months worth at the end of November; denied

## 2015-09-04 NOTE — Telephone Encounter (Signed)
Start Losartan 50 mg daily. Determine if she is taking spironolactone. Needs BMET 1 week later.

## 2015-09-04 NOTE — Telephone Encounter (Signed)
Reviewed echo results with her as Everett Graff had done.  Advised that Dr. Tamala Julian would like to start Losartan 50 mg daily. She has increased her Coreg and is not taking Spironolactone.  States he would like to get a BMET in a week.  She would like to go to Dr. Shaaron Adler office to have lab done since she has transportation problems and their office is closer.  Will fax order for BMET att [name] to [phone]; their office number is [phone]. With order to fax results to Dr. Tamala Julian. Will [facility].

## 2015-09-05 NOTE — Telephone Encounter (Signed)
Thanks

## 2015-09-06 MED ORDER — AMITRIPTYLINE HCL 25 MG PO TABS: 75.0000 mg | ORAL_TABLET | Freq: Every day | ORAL | Status: DC

## 2015-09-06 NOTE — Telephone Encounter (Signed)
I called patient. I will call in a prescription for the amitriptyline. The patient does not have a revisit schedule through our office, I will get this set up.

## 2015-09-08 NOTE — Telephone Encounter (Signed)
I called the patient to schedule appointment with Patricia Blossom, NP. Patient states she is not able to get into our office for appointment at this time because she is restricted from driving. She hates to ask anyone to bring her into the office because she lives so far away. She is supposed to see her cardiologist in March and is hoping he will clear her to drive. She will call back to schedule appointment at that time. She wanted to let Dr. Jannifer Fischer know she has not been having the headaches like she once was. They are improving.

## 2015-09-10 ENCOUNTER — Telehealth: Payer: Self-pay

## 2015-09-10 NOTE — Telephone Encounter (Signed)
No, we're not doing that anymore; she can go to a [facility] draw station or the hospital I believe

## 2015-09-10 NOTE — Telephone Encounter (Signed)
Patient notified

## 2015-09-10 NOTE — Telephone Encounter (Signed)
Patient called and stated that her cardiologist, Dr.Smith, would be sending a lab order to [place]. I see it mentioned in the phone encounter note from 09/03/15. She wants to know if she can come here and have those labs drawn.

## 2015-09-18 ENCOUNTER — Other Ambulatory Visit
Admission: RE | Admit: 2015-09-18 | Discharge: 2015-09-18 | Disposition: A | Discharge status: Home / Self Care | Payer: Medicare Other | Source: Ambulatory Visit | Attending: Interventional Cardiology | Admitting: Interventional Cardiology

## 2015-09-18 DIAGNOSIS — E785 Hyperlipidemia, unspecified: Secondary | ICD-10-CM | POA: Insufficient documentation

## 2015-09-18 LAB — LIPID PANEL
CHOL/HDL RATIO: 3.4 ratio
CHOLESTEROL: 171 mg/dL (ref 0–200)
HDL: 51 mg/dL (ref >40)
LDL Cholesterol: 95 mg/dL (ref 0–99)
Triglycerides: 123 mg/dL (ref <150)
VLDL: 25 mg/dL (ref 0–40)

## 2015-09-18 LAB — ALT: ALT: 14 U/L (ref 14–54)

## 2015-09-23 ENCOUNTER — Other Ambulatory Visit: Payer: Self-pay

## 2015-09-23 DIAGNOSIS — E785 Hyperlipidemia, unspecified: Secondary | ICD-10-CM

## 2015-10-06 ENCOUNTER — Emergency Department: Payer: Medicare Other

## 2015-10-06 ENCOUNTER — Ambulatory Visit (INDEPENDENT_AMBULATORY_CARE_PROVIDER_SITE_OTHER): Payer: Medicare Other | Admitting: Family Medicine

## 2015-10-06 ENCOUNTER — Observation Stay
Admission: EM | Admit: 2015-10-06 | Discharge: 2015-10-07 | Disposition: A | Discharge status: Home / Self Care | Payer: Medicare Other | Attending: Internal Medicine | Admitting: Internal Medicine

## 2015-10-06 ENCOUNTER — Encounter: Payer: Self-pay | Admitting: Family Medicine

## 2015-10-06 ENCOUNTER — Encounter: Payer: Self-pay | Admitting: *Deleted

## 2015-10-06 VITALS — BP 98/67 | HR 84 | Temp 98.7°F | Wt 217.0 lb

## 2015-10-06 DIAGNOSIS — Z79899 Other long term (current) drug therapy: Secondary | ICD-10-CM | POA: Insufficient documentation

## 2015-10-06 DIAGNOSIS — I272 Other secondary pulmonary hypertension: Secondary | ICD-10-CM | POA: Insufficient documentation

## 2015-10-06 DIAGNOSIS — R42 Dizziness and giddiness: Secondary | ICD-10-CM | POA: Insufficient documentation

## 2015-10-06 DIAGNOSIS — D649 Anemia, unspecified: Secondary | ICD-10-CM | POA: Diagnosis not present

## 2015-10-06 DIAGNOSIS — I517 Cardiomegaly: Secondary | ICD-10-CM | POA: Insufficient documentation

## 2015-10-06 DIAGNOSIS — R55 Syncope and collapse: Secondary | ICD-10-CM | POA: Diagnosis not present

## 2015-10-06 DIAGNOSIS — Z885 Allergy status to narcotic agent status: Secondary | ICD-10-CM | POA: Insufficient documentation

## 2015-10-06 DIAGNOSIS — R071 Chest pain on breathing: Secondary | ICD-10-CM | POA: Diagnosis not present

## 2015-10-06 DIAGNOSIS — I951 Orthostatic hypotension: Secondary | ICD-10-CM

## 2015-10-06 DIAGNOSIS — M542 Cervicalgia: Secondary | ICD-10-CM | POA: Diagnosis not present

## 2015-10-06 DIAGNOSIS — I10 Essential (primary) hypertension: Secondary | ICD-10-CM | POA: Diagnosis present

## 2015-10-06 DIAGNOSIS — Z7951 Long term (current) use of inhaled steroids: Secondary | ICD-10-CM | POA: Insufficient documentation

## 2015-10-06 DIAGNOSIS — M25512 Pain in left shoulder: Secondary | ICD-10-CM

## 2015-10-06 DIAGNOSIS — J45909 Unspecified asthma, uncomplicated: Secondary | ICD-10-CM | POA: Insufficient documentation

## 2015-10-06 DIAGNOSIS — F419 Anxiety disorder, unspecified: Secondary | ICD-10-CM | POA: Insufficient documentation

## 2015-10-06 DIAGNOSIS — I447 Left bundle-branch block, unspecified: Secondary | ICD-10-CM | POA: Insufficient documentation

## 2015-10-06 DIAGNOSIS — E669 Obesity, unspecified: Secondary | ICD-10-CM | POA: Diagnosis not present

## 2015-10-06 DIAGNOSIS — Z6836 Body mass index (BMI) 36.0-36.9, adult: Secondary | ICD-10-CM | POA: Diagnosis not present

## 2015-10-06 DIAGNOSIS — Z7901 Long term (current) use of anticoagulants: Secondary | ICD-10-CM | POA: Insufficient documentation

## 2015-10-06 DIAGNOSIS — Z853 Personal history of malignant neoplasm of breast: Secondary | ICD-10-CM | POA: Diagnosis not present

## 2015-10-06 DIAGNOSIS — I700 Atherosclerosis of aorta: Secondary | ICD-10-CM | POA: Diagnosis not present

## 2015-10-06 DIAGNOSIS — I440 Atrioventricular block, first degree: Secondary | ICD-10-CM | POA: Diagnosis not present

## 2015-10-06 DIAGNOSIS — Z86718 Personal history of other venous thrombosis and embolism: Secondary | ICD-10-CM

## 2015-10-06 DIAGNOSIS — Z9049 Acquired absence of other specified parts of digestive tract: Secondary | ICD-10-CM | POA: Insufficient documentation

## 2015-10-06 DIAGNOSIS — I5022 Chronic systolic (congestive) heart failure: Secondary | ICD-10-CM

## 2015-10-06 DIAGNOSIS — M5134 Other intervertebral disc degeneration, thoracic region: Secondary | ICD-10-CM | POA: Diagnosis not present

## 2015-10-06 DIAGNOSIS — Z803 Family history of malignant neoplasm of breast: Secondary | ICD-10-CM | POA: Insufficient documentation

## 2015-10-06 DIAGNOSIS — R0602 Shortness of breath: Secondary | ICD-10-CM | POA: Diagnosis not present

## 2015-10-06 DIAGNOSIS — Z888 Allergy status to other drugs, medicaments and biological substances status: Secondary | ICD-10-CM | POA: Insufficient documentation

## 2015-10-06 DIAGNOSIS — Z825 Family history of asthma and other chronic lower respiratory diseases: Secondary | ICD-10-CM | POA: Insufficient documentation

## 2015-10-06 DIAGNOSIS — Z882 Allergy status to sulfonamides status: Secondary | ICD-10-CM | POA: Diagnosis not present

## 2015-10-06 DIAGNOSIS — I2692 Saddle embolus of pulmonary artery without acute cor pulmonale: Secondary | ICD-10-CM | POA: Insufficient documentation

## 2015-10-06 DIAGNOSIS — Z809 Family history of malignant neoplasm, unspecified: Secondary | ICD-10-CM | POA: Insufficient documentation

## 2015-10-06 DIAGNOSIS — Z90710 Acquired absence of both cervix and uterus: Secondary | ICD-10-CM | POA: Insufficient documentation

## 2015-10-06 DIAGNOSIS — E785 Hyperlipidemia, unspecified: Secondary | ICD-10-CM | POA: Diagnosis not present

## 2015-10-06 DIAGNOSIS — K219 Gastro-esophageal reflux disease without esophagitis: Secondary | ICD-10-CM | POA: Diagnosis not present

## 2015-10-06 DIAGNOSIS — F329 Major depressive disorder, single episode, unspecified: Secondary | ICD-10-CM | POA: Insufficient documentation

## 2015-10-06 DIAGNOSIS — F331 Major depressive disorder, recurrent, moderate: Secondary | ICD-10-CM | POA: Insufficient documentation

## 2015-10-06 DIAGNOSIS — M25519 Pain in unspecified shoulder: Secondary | ICD-10-CM | POA: Diagnosis not present

## 2015-10-06 DIAGNOSIS — G43711 Chronic migraine without aura, intractable, with status migrainosus: Secondary | ICD-10-CM | POA: Diagnosis not present

## 2015-10-06 DIAGNOSIS — Z8249 Family history of ischemic heart disease and other diseases of the circulatory system: Secondary | ICD-10-CM | POA: Diagnosis not present

## 2015-10-06 DIAGNOSIS — Z880 Allergy status to penicillin: Secondary | ICD-10-CM | POA: Insufficient documentation

## 2015-10-06 DIAGNOSIS — R635 Abnormal weight gain: Secondary | ICD-10-CM | POA: Diagnosis not present

## 2015-10-06 DIAGNOSIS — I5020 Unspecified systolic (congestive) heart failure: Secondary | ICD-10-CM | POA: Diagnosis present

## 2015-10-06 HISTORY — DX: Acute embolism and thrombosis of unspecified deep veins of unspecified lower extremity: I82.409

## 2015-10-06 LAB — URINALYSIS COMPLETE WITH MICROSCOPIC (ARMC ONLY)
BACTERIA UA: NONE SEEN
Bilirubin Urine: NEGATIVE
Glucose, UA: NEGATIVE mg/dL
HGB URINE DIPSTICK: NEGATIVE
Ketones, ur: NEGATIVE mg/dL
LEUKOCYTES UA: NEGATIVE
NITRITE: NEGATIVE
PH: 6 (ref 5.0–8.0)
PROTEIN: NEGATIVE mg/dL
SPECIFIC GRAVITY, URINE: 1.026 (ref 1.005–1.030)

## 2015-10-06 LAB — BASIC METABOLIC PANEL
ANION GAP: 7 (ref 5–15)
BUN: 13 mg/dL (ref 6–20)
CHLORIDE: 101 mmol/L (ref 101–111)
CO2: 31 mmol/L (ref 22–32)
CREATININE: 0.82 mg/dL (ref 0.44–1.00)
Calcium: 9.2 mg/dL (ref 8.9–10.3)
GFR calc non Af Amer: >60 mL/min (ref >60)
GLUCOSE: 107 mg/dL — AB (ref 65–99)
Potassium: 3.5 mmol/L (ref 3.5–5.1)
Sodium: 139 mmol/L (ref 135–145)

## 2015-10-06 LAB — CBC
HCT: 37.9 % (ref 35.0–47.0)
HEMOGLOBIN: 12.6 g/dL (ref 12.0–16.0)
MCH: 27.5 pg (ref 26.0–34.0)
MCHC: 33.2 g/dL (ref 32.0–36.0)
MCV: 82.8 fL (ref 80.0–100.0)
Platelets: 183 10*3/uL (ref 150–440)
RBC: 4.57 MIL/uL (ref 3.80–5.20)
RDW: 13.6 % (ref 11.5–14.5)
WBC: 7.1 10*3/uL (ref 3.6–11.0)

## 2015-10-06 LAB — FIBRIN DERIVATIVES D-DIMER (ARMC ONLY): Fibrin derivatives D-dimer (ARMC): 537 — ABNORMAL HIGH (ref 0–499)

## 2015-10-06 LAB — TROPONIN I: Troponin I: <0.03 ng/mL (ref <0.031)

## 2015-10-06 MED ORDER — IOHEXOL 350 MG/ML SOLN
75.0000 mL | Freq: Once | INTRAVENOUS | Status: DC | PRN
  Filled 2015-10-06: qty 75

## 2015-10-06 MED ORDER — TIZANIDINE HCL 2 MG PO TABS: 2.0000 mg | ORAL_TABLET | Freq: Every evening | ORAL | Status: DC | PRN

## 2015-10-06 MED ORDER — SODIUM CHLORIDE 0.9 % IV BOLUS (SEPSIS)
1000.0000 mL | Freq: Once | INTRAVENOUS | Status: AC
  Administered 2015-10-06: 1000 mL via INTRAVENOUS

## 2015-10-06 MED ORDER — HYDROCODONE-ACETAMINOPHEN 5-325 MG PO TABS: 1.0000 | ORAL_TABLET | Freq: Four times a day (QID) | ORAL | Status: DC | PRN

## 2015-10-06 NOTE — ED Notes (Signed)
Pt arrives from [place] office, states she was sent to ED because they did orthostatics and her BP was low, pt states she feels like she feels weak, states she has a hx of blood clots in lungs and legs and her PCP was afraid she has another blood clot, pt states left sided pain from her neck to her shoulder blades with some SOB,  States for the past few days she has felt like she was going to pass out

## 2015-10-06 NOTE — Progress Notes (Signed)
BP 98/67 mmHg  Pulse 84  Temp(Src) 98.7 F (37.1 C)  Wt 217 lb (98.431 kg)  SpO2 98%   Subjective:    Patient ID: Patricia Fischer, female    DOB: [birth year], 60 y.o.   MRN: [id number]  HPI: Patricia Fischer is a 61 y.o. female  Chief Complaint  Patient presents with  . Follow-up    2 month, she states she has no energy.  . Hypertension    The cardiologist started her on Losartan, since then she's been having headaches  . Black out feelings    She has feelings like she's gonna black out, the room starts spinning.   She has been started on new medicine by the cardiologist; her heart is only working 35% so they started new medicine She has been on the new medicine for a month She was feeling like she was getting black outs; she called the cardiologist, told them she felt like she was going to pass out, and they told her to stay on the medicine (this is her side); she can stand up sometimes and waits a second or two and then has to grab something No actual falls and no actual syncope  She continues to have pain from her PE, but has pain along the left side of the neck and shoulder too; she was referred to pain clinic but patient has not heard anything She has a heating pad that she warms up in the microwave; just all along the the left shoulder and upper chest and arm Has not seen vascular or hematologist throughout this ordeal Taking Xarelto the whole time Feels like a bruise up in the left side of the neck  She had been on tramadol, but it was not very effective  Relevant past medical, surgical, family and social history reviewed and updated as indicated Past Medical History  Diagnosis Date  . Essential hypertension   . Asthma   . Environmental allergies   . Pneumonia 2012  . Blood transfusion [place]  . GERD (gastroesophageal reflux disease)   . Depression   . Breast cancer, left breast ([place]) 2011    DCIS  . Head injury 2011  . Anxiety   . Hemorrhoids    . Obese   . Chronic migraine without aura, with intractable migraine, so stated, with status migrainosus March 2016  . Chronic systolic CHF (congestive heart failure) ([place]) 05/05/2015  . Syncope Aug 2016  . Orthostatic hypotension Aug. 2016  . Pulmonary emboli ([place]) Aug. 2016  . Troponin level elevated Aug. 2016  . Acute systolic heart failure [facility]) Aug 2016  . DVT (deep venous thrombosis) [facility])    Past Surgical History  Procedure Laterality Date  . Breast surgery Left 2011    Breast lumpectomy  . Cardiac catheterization  2010    [facility]; pt states it was "clear"  . Tonsillectomy    . Appendectomy    . Cholecystectomy    . Tubal ligation    . Anal fissure repair    . Knee cartilage surgery Left   . Carpal tunnel release Bilateral   . Tarsal tunnel release Left 1990  . Anterior cervical decomp/discectomy fusion  01/23/2012    Procedure: ANTERIOR CERVICAL DECOMPRESSION/DISCECTOMY FUSION 1 LEVEL;  Surgeon: Elaina Hoops, MD;  Location: [place] [facility];  Service: Neurosurgery;  Laterality: N/A;  Cervical five-six anterior cervical discectomy fusion with plating  . Cesarean section  1982, 1979  . Abdominal hysterectomy  1986  . Colonoscopy  01/16/2009    Dr Bary Castilla  . Shoulder arthroscopy with subacromial decompression, rotator cuff repair and bicep tendon repair Right 11/22/2013    Procedure: RIGHT SHOULDER ATHROSCOPY OPEN SUBSCAP REPAIR DELTA-PECTORAL ;  Surgeon: Augustin Schooling, MD;  Location: [facility];  Service: Orthopedics;  Laterality: Right;    Family History  Problem Relation Age of Onset  . Anesthesia problems Neg Hx   . Diabetes Neg Hx   . Breast cancer Paternal Aunt   . Breast cancer Mother   . Migraines Mother   . Cancer Mother   . Hypertension Mother   . Rectal cancer Father   . Cancer Father   . COPD Father   . Heart disease Paternal Grandmother   . Stroke Paternal Grandmother   . Heart attack Paternal Grandmother   . Aneurysm Paternal Grandfather   . COPD  Paternal Grandfather   she has fam hx of rectal cancer (father, diagnosed around age 26) 59 had an aneurysm to the brain, fatal Mother was adopted so we don't dknow about   Interim medical history since our last visit reviewed. Allergies and medications reviewed and updated.  Review of Systems  HENT: Negative for nosebleeds.   Respiratory: Positive for shortness of breath.   Cardiovascular: Positive for chest pain. Negative for leg swelling.  Gastrointestinal: Negative for blood in stool.  Genitourinary: Negative for hematuria.   Per HPI unless specifically indicated above     Objective:    BP 98/67 mmHg  Pulse 84  Temp(Src) 98.7 F (37.1 C)  Wt 217 lb (98.431 kg)  SpO2 98%  Wt Readings from Last 3 Encounters:  10/06/15 217 lb (98.431 kg)  10/06/15 217 lb (98.431 kg)  08/25/15 217 lb 6.4 oz (98.612 kg)    98/68 pulse 74 supine Felt too poorly to stand; felt too badly to stand and check standing orthostatics  Physical Exam  Constitutional: She appears well-developed and well-nourished.  Older female, appears weak and tired, nontoxic  HENT:  Head: Normocephalic and atraumatic.  Eyes: EOM are normal. No scleral icterus.  Neck: No JVD present.  Cardiovascular: Normal rate and regular rhythm.   Pulmonary/Chest: Effort normal and breath sounds normal.  Soft tissue swelling over the upper chest left side, tender  Abdominal: Bowel sounds are normal. She exhibits no distension.  Musculoskeletal: She exhibits no edema.  Skin: Skin is warm and dry. No pallor.  Nevus spilus right side abdomen  Psychiatric: Her mood appears anxious. She exhibits a depressed mood.   Results for orders placed or performed during the hospital encounter of 09/18/15  ALT  Result Value Ref Range   ALT 14 14 - 54 U/L  Lipid panel  Result Value Ref Range   Cholesterol 171 0 - 200 mg/dL   Triglycerides 123 <150 mg/dL   HDL 51 >40 mg/dL   Total CHOL/HDL Ratio 3.4 RATIO   VLDL 25 0 - 40  mg/dL   LDL Cholesterol 95 0 - 99 mg/dL      Assessment & Plan:   Problem List Items Addressed This Visit      Cardiovascular and Mediastinum   Chronic systolic CHF (congestive heart failure) (HCC)    LVEF 35%, managed by cardiologist      Saddle pulmonary embolus ([place])    With neck pain getting progressively worse; will send to vascular surgeon to evaluate for left side chest, shoulder, neck DVT      Relevant Orders   Ambulatory referral to  Vascular Surgery   Orthostatic hypotension - Primary    Sending her to the ER; she is too weak to stand for vitals, and has been having presyncope after addition of BP med by cardiologist        Other   Chest pain on breathing    Chronic pain which we believe is secondary to her PE; awaiting pain clinic appointment; she was in tears explaining the pain she has and denies red flags; Rx provided and we'll see again about this referral; red flags reviewed again, reasons to contact me; do NOT drink, take anxiety pills, other pain pills, etc.      Neck pain on left side    Discussed with vascular doctor; not sure if she has a DVT or if musculoskeletal; she is going to the ER      Shoulder pain, left    Discussed with vascular doctor; not sure if she has a DVT or if musculoskeletal; she is going to the ER         Follow up plan: No Follow-up on file. (after ER/hospital stay)  Meds ordered this encounter  Medications  . HYDROcodone-acetaminophen (NORCO/VICODIN) 5-325 MG tablet    Sig: Take 1 tablet by mouth every 6 (six) hours as needed for moderate pain.    Dispense:  40 tablet    Refill:  0  . tiZANidine (ZANAFLEX) 2 MG tablet    Sig: Take 1-2 tablets (2-4 mg total) by mouth at bedtime as needed for muscle spasms.    Dispense:  45 tablet    Refill:  1

## 2015-10-06 NOTE — Patient Instructions (Addendum)
Please do see the vascular doctor today at [facility], [place], [place] to the ER instead Continue your Xarelto as directed until you see them, as they will scan you and may change the medicine; follow their instructions for your blood thinner Use the pain medicine prescribed today as directed if needed STOP losartan and follow-up with Dr. Tamala Julian

## 2015-10-06 NOTE — ED Provider Notes (Signed)
[facility] Emergency Department Provider Note    ____________________________________________  Time seen: ~1855  I have reviewed the triage vital signs and the nursing notes.   HISTORY  Chief Complaint Hypotension   History limited by: Not Limited   HPI Patricia Fischer is a 60 y.o. female who presents to the emergency department today from [place] office because of concerns for orthostatic hypertension and dizziness. The patient states that she has been having problems with dizziness for roughly the past month since being started on losartan. She states that it has been somewhat intermittent. She states that these symptoms are worse when she stands. She feels dizzy. She states that at her doctor's office today they told her that she should discontinue the losartan. She has had some left sided pain with the dizziness. She describes it as being located from the neck into the left chest. It is sharp. Will become severe when the dizziness and strong. She denies any fevers.    Past Medical History  Diagnosis Date  . Essential hypertension   . Asthma   . Environmental allergies   . Pneumonia 2012  . Blood transfusion [place]  . GERD (gastroesophageal reflux disease)   . Depression   . Breast cancer, left breast ([place]) 2011    DCIS  . Head injury 2011  . Anxiety   . Hemorrhoids   . Obese   . Chronic migraine without aura, with intractable migraine, so stated, with status migrainosus March 2016  . Chronic systolic CHF (congestive heart failure) ([place]) 05/05/2015  . Syncope Aug 2016  . Orthostatic hypotension Aug. 2016  . Pulmonary emboli ([place]) Aug. 2016  . Troponin level elevated Aug. 2016  . Acute systolic heart failure [facility]) Aug 2016  . DVT (deep venous thrombosis) [facility])     Patient Active Problem List   Diagnosis Date Noted  . Abnormal weight gain 08/08/2015  . Controlled substance agreement signed 08/08/2015  . Chest pain on  breathing 06/11/2015  . Depression, major, recurrent, moderate ([place]) 06/11/2015  . Anxiety about health 06/11/2015  . Anemia 05/15/2015  . Do not resuscitate status 05/15/2015  . Chronic systolic CHF (congestive heart failure) ([place]) 05/05/2015  . Left leg DVT ([place]) 05/05/2015  . Pulmonary hypertension ([place])   . Saddle pulmonary embolus ([place])   . Acute systolic heart failure ([place]) 04/21/2015  . LBBB (left bundle branch block)-rate related  04/19/2015  . Normal coronary arteries-10 yrs ago 04/19/2015  . Obesity, Class II, BMI 35-39.9, with comorbidity ([place]) 04/19/2015  . Chronic migraine without aura, with intractable migraine, so stated, with status migrainosus 11/04/2014  . Headache 04/29/2014  . Subscapularis (muscle) sprain 11/22/2013    Past Surgical History  Procedure Laterality Date  . Breast surgery Left 2011    Breast lumpectomy  . Cardiac catheterization  2010    [facility]; pt states it was "clear"  . Tonsillectomy    . Appendectomy    . Cholecystectomy    . Tubal ligation    . Anal fissure repair    . Knee cartilage surgery Left   . Carpal tunnel release Bilateral   . Tarsal tunnel release Left 1990  . Anterior cervical decomp/discectomy fusion  01/23/2012    Procedure: ANTERIOR CERVICAL DECOMPRESSION/DISCECTOMY FUSION 1 LEVEL;  Surgeon: Elaina Hoops, MD;  Location: [place] [facility];  Service: Neurosurgery;  Laterality: N/A;  Cervical five-six anterior cervical discectomy fusion with plating  . Cesarean section  1982, 1979  .  Abdominal hysterectomy  1986  . Colonoscopy  01/16/2009    Dr Bary Castilla  . Shoulder arthroscopy with subacromial decompression, rotator cuff repair and bicep tendon repair Right 11/22/2013    Procedure: RIGHT SHOULDER ATHROSCOPY OPEN SUBSCAP REPAIR DELTA-PECTORAL ;  Surgeon: Augustin Schooling, MD;  Location: [place];  Service: Orthopedics;  Laterality: Right;    Current Outpatient Rx  Name  Route  Sig  Dispense  Refill  . albuterol (PROVENTIL  HFA;VENTOLIN HFA) 108 (90 BASE) MCG/ACT inhaler   Inhalation   Inhale 2 puffs into the lungs every 6 (six) hours as needed for shortness of breath.          Marland Kitchen amitriptyline (ELAVIL) 25 MG tablet   Oral   Take 3 tablets (75 mg total) by mouth at bedtime.   90 tablet   3   . atorvastatin (LIPITOR) 20 MG tablet   Oral   Take 1 tablet (20 mg total) by mouth daily at 6 PM.   30 tablet   11   . Blood Pressure Monitoring (BLOOD PRESSURE CUFF) MISC   Does not apply   1 Device by Does not apply route once.   1 each   0     AUTOMATIC BLOOD PRESSURE CUFF   . carvedilol (COREG) 6.25 MG tablet   Oral   Take 1 tablet (6.25 mg total) by mouth 2 (two) times daily with a meal.   60 tablet   5   . fluticasone (FLONASE) 50 MCG/ACT nasal spray   Each Nare   Place 1 spray into both nostrils daily as needed for allergies or rhinitis.       11   . HYDROcodone-acetaminophen (NORCO/VICODIN) 5-325 MG tablet   Oral   Take 1 tablet by mouth every 6 (six) hours as needed for moderate pain.   40 tablet   0   . ondansetron (ZOFRAN ODT) 4 MG disintegrating tablet   Oral   Take 1 tablet (4 mg total) by mouth every 8 (eight) hours as needed for nausea or vomiting.   30 tablet   2   . rivaroxaban (XARELTO) 20 MG TABS tablet   Oral   Take 1 tablet (20 mg total) by mouth daily with supper.   30 tablet   0   . tiZANidine (ZANAFLEX) 2 MG tablet   Oral   Take 1-2 tablets (2-4 mg total) by mouth at bedtime as needed for muscle spasms.   45 tablet   1     Allergies Gadolinium derivatives; Sulfa antibiotics; Aripiprazole; Codeine; Oxycodone; and Penicillins  Family History  Problem Relation Age of Onset  . Anesthesia problems Neg Hx   . Diabetes Neg Hx   . Breast cancer Paternal Aunt   . Breast cancer Mother   . Migraines Mother   . Cancer Mother   . Hypertension Mother   . Rectal cancer Father   . Cancer Father   . COPD Father   . Heart disease Paternal Grandmother   . Stroke  Paternal Grandmother   . Heart attack Paternal Grandmother   . Aneurysm Paternal Grandfather   . COPD Paternal Grandfather     Social History Social History  Substance Use Topics  . Smoking status: Never Smoker   . Smokeless tobacco: Never Used  . Alcohol Use: No     Comment: occasional wine    Review of Systems  Constitutional: Negative for fever. Cardiovascular: Positive for chest pain. Respiratory: Negative for shortness of breath. Gastrointestinal: Negative  for abdominal pain, vomiting and diarrhea. Neurological: Negative for headaches, focal weakness or numbness.   10-point ROS otherwise negative.  ____________________________________________   PHYSICAL EXAM:  VITAL SIGNS: ED Triage Vitals  Enc Vitals Group     BP 10/06/15 1218 100/66 mmHg     Pulse Rate 10/06/15 1218 88     Resp 10/06/15 1218 15     Temp 10/06/15 1218 98.3 F (36.8 C)     Temp Source 10/06/15 1218 Oral     SpO2 10/06/15 1218 96 %     Weight 10/06/15 1218 217 lb (98.431 kg)     Height 10/06/15 1218 5\' 5"  (1.651 m)     Head Cir --      Peak Flow --      Pain Score 10/06/15 1231 8   Constitutional: Alert and oriented. Well appearing and in no distress. Eyes: Conjunctivae are normal. PERRL. Normal extraocular movements. ENT   Head: Normocephalic and atraumatic.   Nose: No congestion/rhinnorhea.   Mouth/Throat: Mucous membranes are moist.   Neck: No stridor. Hematological/Lymphatic/Immunilogical: No cervical lymphadenopathy. Cardiovascular: Normal rate, regular rhythm.  No murmurs, rubs, or gallops. Respiratory: Normal respiratory effort without tachypnea nor retractions. Breath sounds are clear and equal bilaterally. No wheezes/rales/rhonchi. Gastrointestinal: Soft and nontender. No distention.  Genitourinary: Deferred Musculoskeletal: Normal range of motion in all extremities. No joint effusions.  No lower extremity tenderness nor edema. Neurologic:  Normal speech and  language. No gross focal neurologic deficits are appreciated. Dix-Hallpike negative. Upon standing the patient becomes unbalanced. Skin:  Skin is warm, dry and intact. No rash noted. Psychiatric: Mood and affect are normal. Speech and behavior are normal. Patient exhibits appropriate insight and judgment.  ____________________________________________    LABS (pertinent positives/negatives)  Labs Reviewed  BASIC METABOLIC PANEL - Abnormal; Notable for the following:    Glucose, Bld 107 (*)    All other components within normal limits  FIBRIN DERIVATIVES D-DIMER ([facility] ONLY) - Abnormal; Notable for the following:    Fibrin derivatives D-dimer ([facility]) 537 (*)    All other components within normal limits  CBC  TROPONIN I     ____________________________________________   EKG  I, Nance Pear, attending physician, personally viewed and interpreted this EKG  EKG Time: 1239 Rate: 81 Rhythm: sinus rhythm with first degree av block Axis: left axis deviation Intervals: qtc 404, 1st degree av block QRS: narrow, lvh ST changes: no st elevation Impression: abnormal ekg ____________________________________________    RADIOLOGY  CXR IMPRESSION: No active cardiopulmonary disease. Mild degenerative changes thoracic spine.  CT angio PE  IMPRESSION: 1. No acute pulmonary embolus 2. No active cardiopulmonary abnormalities noted.  ____________________________________________   PROCEDURES  Procedure(s) performed: None  Critical Care performed: No  ____________________________________________   INITIAL IMPRESSION / ASSESSMENT AND PLAN / ED COURSE  Pertinent labs & imaging results that were available during my care of the patient were reviewed by me and considered in my medical decision making (see chart for details).  Patient presented to the emergency department today from [place] office because of concerns for orthostatic hypotension and dizziness. On exam here  patient does become quite dizzy and unbalanced upon standing. Patient was orthostatic. Patient was given multiple fluid boluses without any significant improvement in her clinical picture. Given history of heart failure will plan on admission to the hospitalist service for further workup and management.  ____________________________________________   FINAL CLINICAL IMPRESSION(S) / ED DIAGNOSES  Final diagnoses:  Syncope     Nance Pear, MD 10/07/15  1813 

## 2015-10-06 NOTE — Assessment & Plan Note (Signed)
With neck pain getting progressively worse; will send to vascular surgeon to evaluate for left side chest, shoulder, neck DVT

## 2015-10-07 ENCOUNTER — Observation Stay: Payer: Medicare Other

## 2015-10-07 DIAGNOSIS — Z86718 Personal history of other venous thrombosis and embolism: Secondary | ICD-10-CM | POA: Diagnosis not present

## 2015-10-07 DIAGNOSIS — I10 Essential (primary) hypertension: Secondary | ICD-10-CM | POA: Diagnosis not present

## 2015-10-07 DIAGNOSIS — I951 Orthostatic hypotension: Secondary | ICD-10-CM | POA: Diagnosis not present

## 2015-10-07 DIAGNOSIS — I5022 Chronic systolic (congestive) heart failure: Secondary | ICD-10-CM | POA: Diagnosis not present

## 2015-10-07 DIAGNOSIS — R55 Syncope and collapse: Secondary | ICD-10-CM | POA: Diagnosis not present

## 2015-10-07 DIAGNOSIS — K219 Gastro-esophageal reflux disease without esophagitis: Secondary | ICD-10-CM | POA: Diagnosis present

## 2015-10-07 LAB — BASIC METABOLIC PANEL
ANION GAP: 3 — AB (ref 5–15)
BUN: 13 mg/dL (ref 6–20)
CHLORIDE: 105 mmol/L (ref 101–111)
CO2: 30 mmol/L (ref 22–32)
Calcium: 8.2 mg/dL — ABNORMAL LOW (ref 8.9–10.3)
Creatinine, Ser: 0.72 mg/dL (ref 0.44–1.00)
GFR calc non Af Amer: >60 mL/min (ref >60)
Glucose, Bld: 107 mg/dL — ABNORMAL HIGH (ref 65–99)
POTASSIUM: 3.2 mmol/L — AB (ref 3.5–5.1)
Sodium: 138 mmol/L (ref 135–145)

## 2015-10-07 LAB — CBC
HCT: 33.6 % — ABNORMAL LOW (ref 35.0–47.0)
HEMOGLOBIN: 11.3 g/dL — AB (ref 12.0–16.0)
MCH: 27.8 pg (ref 26.0–34.0)
MCHC: 33.5 g/dL (ref 32.0–36.0)
MCV: 82.9 fL (ref 80.0–100.0)
Platelets: 139 10*3/uL — ABNORMAL LOW (ref 150–440)
RBC: 4.05 MIL/uL (ref 3.80–5.20)
RDW: 13.9 % (ref 11.5–14.5)
WBC: 6.6 10*3/uL (ref 3.6–11.0)

## 2015-10-07 MED ORDER — RIVAROXABAN 20 MG PO TABS
20.0000 mg | ORAL_TABLET | Freq: Every day | ORAL | Status: DC
  Filled 2015-10-07 (×2): qty 1

## 2015-10-07 MED ORDER — ONDANSETRON HCL 4 MG/2ML IJ SOLN: 4.0000 mg | Freq: Four times a day (QID) | INTRAMUSCULAR | Status: DC | PRN

## 2015-10-07 MED ORDER — ACETAMINOPHEN 325 MG PO TABS
650.0000 mg | ORAL_TABLET | Freq: Four times a day (QID) | ORAL | Status: DC | PRN
  Administered 2015-10-07 (×3): 650 mg via ORAL
  Filled 2015-10-07 (×3): qty 2

## 2015-10-07 MED ORDER — ONDANSETRON HCL 4 MG PO TABS: 4.0000 mg | ORAL_TABLET | Freq: Four times a day (QID) | ORAL | Status: DC | PRN

## 2015-10-07 MED ORDER — ACETAMINOPHEN 650 MG RE SUPP: 650.0000 mg | Freq: Four times a day (QID) | RECTAL | Status: DC | PRN

## 2015-10-07 MED ORDER — RIVAROXABAN 20 MG PO TABS
20.0000 mg | ORAL_TABLET | Freq: Every day | ORAL | Status: DC
  Administered 2015-10-07: 20 mg via ORAL
  Filled 2015-10-07 (×2): qty 1

## 2015-10-07 MED ORDER — SODIUM CHLORIDE 0.9 % IV SOLN
INTRAVENOUS | Status: AC
  Administered 2015-10-07: 04:00:00 via INTRAVENOUS

## 2015-10-07 MED ORDER — POTASSIUM CHLORIDE CRYS ER 20 MEQ PO TBCR: 40.0000 meq | EXTENDED_RELEASE_TABLET | Freq: Once | ORAL | Status: DC

## 2015-10-07 MED ORDER — HYDROCODONE-ACETAMINOPHEN 5-325 MG PO TABS
1.0000 | ORAL_TABLET | Freq: Once | ORAL | Status: AC
  Administered 2015-10-07: 1 via ORAL
  Filled 2015-10-07: qty 1

## 2015-10-07 MED ORDER — HYDROCODONE-ACETAMINOPHEN 5-325 MG PO TABS
1.0000 | ORAL_TABLET | ORAL | Status: DC | PRN
  Administered 2015-10-07 (×3): 1 via ORAL
  Filled 2015-10-07 (×3): qty 1

## 2015-10-07 MED ORDER — ATORVASTATIN CALCIUM 20 MG PO TABS: 20.0000 mg | ORAL_TABLET | Freq: Every day | ORAL | Status: DC

## 2015-10-07 NOTE — Progress Notes (Signed)
Pt discharged home.  Discharge instructions, prescriptions and follow up appointment given to and reviewed with pt.  Pt verbalized understanding.  Escorted by auxillary. 

## 2015-10-07 NOTE — H&P (Signed)
[facility] NAME: Patricia Fischer    MR#:  [id number]  DATE OF BIRTH:  02-Jun-1956  DATE OF ADMISSION:  10/06/2015  PRIMARY CARE PHYSICIAN: [name], MD   REQUESTING/REFERRING PHYSICIAN: Archie Balboa, MD  CHIEF COMPLAINT:   Chief Complaint  Patient presents with  . Hypotension    HISTORY OF PRESENT ILLNESS:  Patricia Fischer  is a 60 y.o. female who presents with orthostatic hypotension. Patient has a six-month history of intermittent activation of this problem. She states that 6 months ago she had pulmonary embolus and after that time began having presyncopal and syncopal episodes. She was found to have orthostatic hypotension then. After some time symptoms improved. During this time she was also diagnosed with chronic systolic congestive heart failure. Last echocardiogram showed an EF of 35%. She was started on losartan about one month ago for heart failure treatment, and states that just after that time she began having these episodes of syncope and orthostatic hypotension again. Patient lives alone at home by herself, and has been concerned about her syncopal episodes. She also describes persistent malaise and fatigue during the same time frame. She alerted her cardiologist office to the same, but they wanted to give a long enough trial on the ARB. She went to see her PCP today, and had orthostatic hypotension in the office which seem to be refractory to any initial treatment. She was told to stop her losartan and was sent to the ED for further evaluation. Here she had persistent orthostatic hypotension. She was given 2L bolus of normal saline with no significant effect. At that point hospitalists were called for admission for further workup of her orthostatic hypotension.  PAST MEDICAL HISTORY:   Past Medical History  Diagnosis Date  . Essential hypertension   . Asthma   . Environmental allergies   . Pneumonia 2012  . Blood  transfusion [place]  . GERD (gastroesophageal reflux disease)   . Depression   . Breast cancer, left breast ([place]) 2011    DCIS  . Head injury 2011  . Anxiety   . Hemorrhoids   . Obese   . Chronic migraine without aura, with intractable migraine, so stated, with status migrainosus March 2016  . Chronic systolic CHF (congestive heart failure) ([place]) 05/05/2015  . Syncope Aug 2016  . Orthostatic hypotension Aug. 2016  . Pulmonary emboli ([place]) Aug. 2016  . Troponin level elevated Aug. 2016  . Acute systolic heart failure [facility]) Aug 2016  . DVT (deep venous thrombosis) ([place])     PAST SURGICAL HISTORY:   Past Surgical History  Procedure Laterality Date  . Breast surgery Left 2011    Breast lumpectomy  . Cardiac catheterization  2010    [facility]; pt states it was "clear"  . Tonsillectomy    . Appendectomy    . Cholecystectomy    . Tubal ligation    . Anal fissure repair    . Knee cartilage surgery Left   . Carpal tunnel release Bilateral   . Tarsal tunnel release Left 1990  . Anterior cervical decomp/discectomy fusion  01/23/2012    Procedure: ANTERIOR CERVICAL DECOMPRESSION/DISCECTOMY FUSION 1 LEVEL;  Surgeon: Elaina Hoops, MD;  Location: [facility];  Service: Neurosurgery;  Laterality: N/A;  Cervical five-six anterior cervical discectomy fusion with plating  . Cesarean section  1982, 1979  . Abdominal hysterectomy  1986  . Colonoscopy  01/16/2009    Dr  Byrnett  . Shoulder arthroscopy with subacromial decompression, rotator cuff repair and bicep tendon repair Right 11/22/2013    Procedure: RIGHT SHOULDER ATHROSCOPY OPEN SUBSCAP REPAIR DELTA-PECTORAL ;  Surgeon: Augustin Schooling, MD;  Location: [facility];  Service: Orthopedics;  Laterality: Right;    SOCIAL HISTORY:   Social History  Substance Use Topics  . Smoking status: Never Smoker   . Smokeless tobacco: Never Used  . Alcohol Use: No     Comment: occasional wine    FAMILY HISTORY:   Family History   Problem Relation Age of Onset  . Anesthesia problems Neg Hx   . Diabetes Neg Hx   . Breast cancer Paternal Aunt   . Breast cancer Mother   . Migraines Mother   . Cancer Mother   . Hypertension Mother   . Rectal cancer Father   . Cancer Father   . COPD Father   . Heart disease Paternal Grandmother   . Stroke Paternal Grandmother   . Heart attack Paternal Grandmother   . Aneurysm Paternal Grandfather   . COPD Paternal Grandfather     DRUG ALLERGIES:   Allergies  Allergen Reactions  . Gadolinium Derivatives Shortness Of Breath and Nausea And Vomiting  . Sulfa Antibiotics Swelling    "Ears swelled up like Dumbo"  . Aripiprazole     Dry mouth with sores   . Codeine Itching and Rash  . Oxycodone Itching  . Penicillins Hives    Has patient had a PCN reaction causing immediate rash, facial/tongue/throat swelling, SOB or lightheadedness with hypotension: Yes Has patient had a PCN reaction causing severe rash involving mucus membranes or skin necrosis: No Has patient had a PCN reaction that required hospitalization No Has patient had a PCN reaction occurring within the last 10 years: No If all of the above answers are "NO", then may proceed with Cephalosporin use.    MEDICATIONS AT HOME:   Prior to Admission medications   Medication Sig Start Date End Date Taking? Authorizing Provider  albuterol (PROVENTIL HFA;VENTOLIN HFA) 108 (90 BASE) MCG/ACT inhaler Inhale 2 puffs into the lungs every 6 (six) hours as needed for shortness of breath.    Yes Historical Provider, MD  amitriptyline (ELAVIL) 25 MG tablet Take 3 tablets (75 mg total) by mouth at bedtime. 09/06/15  Yes Kathrynn Ducking, MD  atorvastatin (LIPITOR) 20 MG tablet Take 1 tablet (20 mg total) by mouth daily at 6 PM. 08/27/15  Yes Belva Crome, MD  carvedilol (COREG) 6.25 MG tablet Take 1 tablet (6.25 mg total) by mouth 2 (two) times daily with a meal. 08/25/15  Yes Belva Crome, MD  fluticasone [facility]) 50 MCG/ACT nasal  spray Place 1 spray into both nostrils daily as needed for allergies or rhinitis.  07/04/14  Yes Historical Provider, MD  HYDROcodone-acetaminophen (NORCO/VICODIN) 5-325 MG tablet Take 1 tablet by mouth every 6 (six) hours as needed for moderate pain. 10/06/15  Yes Arnetha Courser, MD  rivaroxaban (XARELTO) 20 MG TABS tablet Take 1 tablet (20 mg total) by mouth daily with supper. 06/10/15  Yes Arnetha Courser, MD  tiZANidine (ZANAFLEX) 2 MG tablet Take 1-2 tablets (2-4 mg total) by mouth at bedtime as needed for muscle spasms. 10/06/15  Yes Arnetha Courser, MD  Blood Pressure Monitoring (BLOOD PRESSURE CUFF) MISC 1 Device by Does not apply route once. 05/20/15   Rhonda G Barrett, PA-C  ondansetron (ZOFRAN ODT) 4 MG disintegrating tablet Take 1 tablet (4 mg total) by  mouth every 8 (eight) hours as needed for nausea or vomiting. Patient not taking: Reported on 10/07/2015 03/18/15   Kathrynn Ducking, MD    REVIEW OF SYSTEMS:  Review of Systems  Constitutional: Positive for malaise/fatigue. Negative for fever, chills and weight loss.  HENT: Negative for ear pain, hearing loss and tinnitus.   Eyes: Negative for blurred vision, double vision, pain and redness.  Respiratory: Negative for cough, hemoptysis and shortness of breath.   Cardiovascular: Negative for chest pain, palpitations, orthopnea and leg swelling.  Gastrointestinal: Negative for nausea, vomiting, abdominal pain, diarrhea and constipation.  Genitourinary: Negative for dysuria, frequency and hematuria.  Musculoskeletal: Negative for back pain, joint pain and neck pain.  Skin:       No acne, rash, or lesions  Neurological: Positive for loss of consciousness. Negative for dizziness, tremors, focal weakness and weakness.  Endo/Heme/Allergies: Negative for polydipsia. Does not bruise/bleed easily.  Psychiatric/Behavioral: Negative for depression. The patient is not nervous/anxious and does not have insomnia.      VITAL SIGNS:   Filed Vitals:    10/06/15 1754 10/06/15 1853 10/06/15 2007 10/06/15 2322  BP: 115/67 108/70 96/62 108/66  Pulse: 75 72 70 75  Temp:      TempSrc:      Resp: 16  19 16   Height:      Weight:      SpO2: 97% 96% 93% 95%   Wt Readings from Last 3 Encounters:  10/06/15 98.431 kg (217 lb)  10/06/15 98.431 kg (217 lb)  08/25/15 98.612 kg (217 lb 6.4 oz)    PHYSICAL EXAMINATION:  Physical Exam  Vitals reviewed. Constitutional: She is oriented to person, place, and time. She appears well-developed and well-nourished. No distress.  HENT:  Head: Normocephalic and atraumatic.  Mouth/Throat: Oropharynx is clear and moist.  Eyes: Conjunctivae and EOM are normal. Pupils are equal, round, and reactive to light. No scleral icterus.  Neck: Normal range of motion. Neck supple. No JVD present. No thyromegaly present.  Cardiovascular: Normal rate, regular rhythm and intact distal pulses.  Exam reveals no gallop and no friction rub.   No murmur heard. Respiratory: Effort normal and breath sounds normal. No respiratory distress. She has no wheezes. She has no rales.  GI: Soft. Bowel sounds are normal. She exhibits no distension. There is no tenderness.  Musculoskeletal: Normal range of motion. She exhibits no edema.  No arthritis, no gout  Lymphadenopathy:    She has no cervical adenopathy.  Neurological: She is alert and oriented to person, place, and time. No cranial nerve deficit.  No dysarthria, no aphasia  Skin: Skin is warm and dry. No rash noted. No erythema.  Psychiatric: She has a normal mood and affect. Her behavior is normal. Judgment and thought content normal.    LABORATORY PANEL:   CBC  Recent Labs Lab 10/06/15 1235  WBC 7.1  HGB 12.6  HCT 37.9  PLT 183   ------------------------------------------------------------------------------------------------------------------  Chemistries   Recent Labs Lab 10/06/15 1235  NA 139  K 3.5  CL 101  CO2 31  GLUCOSE 107*  BUN 13  CREATININE  0.82  CALCIUM 9.2   ------------------------------------------------------------------------------------------------------------------  Cardiac Enzymes  Recent Labs Lab 10/06/15 1235  TROPONINI <0.03   ------------------------------------------------------------------------------------------------------------------  RADIOLOGY:  Dg Chest 2 View  10/06/2015  CLINICAL DATA:  Left side neck pain, shoulder pain for 2 weeks EXAM: CHEST  2 VIEW COMPARISON:  06/10/2015 FINDINGS: Cardiomediastinal silhouette is stable. There is chronic elevation of the right hemidiaphragm.  No acute infiltrate or pleural effusion. No pulmonary edema. Mild degenerative changes mid and lower thoracic spine. IMPRESSION: No active cardiopulmonary disease. Mild degenerative changes thoracic spine. Electronically Signed   By: Lahoma Crocker M.D.   On: 10/06/2015 13:53   Ct Angio Chest Pe W/cm &/or Wo Cm  10/06/2015  CLINICAL DATA:  Shortness of breath. Left-sided pain from neck to shoulder blades. EXAM: CT ANGIOGRAPHY CHEST WITH CONTRAST TECHNIQUE: Multidetector CT imaging of the chest was performed using the standard protocol during bolus administration of intravenous contrast. Multiplanar CT image reconstructions and MIPs were obtained to evaluate the vascular anatomy. CONTRAST:  100 cc of Isovue 370 COMPARISON:  06/10/2015. FINDINGS: Mediastinum: The heart size appears normal. There is no pericardial effusion. The trachea appears patent and is midline. Unremarkable appearance of the esophagus. No enlarged mediastinal or hilar lymph nodes identified. Mild aortic atherosclerosis. No axillary or supraclavicular adenopathy. The main pulmonary artery is patent. No right or left main pulmonary artery embolus identified. There is no lobar or segmental pulmonary artery filling defects. Lungs/Pleura: No pleural fluid identified. No airspace consolidation or atelectasis. Upper Abdomen: Mild diffuse hepatic steatosis noted previous  cholecystectomy. The visualized portions of the spleen, adrenal glands pancreas and kidneys appear normal Musculoskeletal: Degenerative disc disease noted within the lower thoracic spine Review of the MIP images confirms the above findings. IMPRESSION: 1. No acute pulmonary embolus 2. No active cardiopulmonary abnormalities noted. Electronically Signed   By: Kerby Moors M.D.   On: 10/06/2015 18:53    EKG:   Orders placed or performed during the hospital encounter of 10/06/15  . ED EKG within 10 minutes  . ED EKG within 10 minutes    IMPRESSION AND PLAN:  Principal Problem:   Orthostatic hypotension - unclear etiology at this time. Patient is not diabetic, does not have renal disease or liver disease. No prior history of strokes. High likelihood of losartan aggravating her underlying condition, whatever that may be. Differential is broad and might include everything from central neurological lesion to adrenal abnormalities to cardiogenic cause. However, we have ordered a cardiology consult to help continue this workup. Active Problems:   Chronic systolic CHF (congestive heart failure) ([place]) - defer to cardiology's recommendations for any medication changes.   History of blood clots - continue home dose xarelto.   HTN (hypertension) - patient is currently normotensive, will hold antihypertensives for now.   Depression, major, recurrent, moderate ([place]) - continue home meds   GERD (gastroesophageal reflux disease) - not on any medication for this consistently at home, will treat when necessary  All the records are reviewed and case discussed with ED provider. Management plans discussed with the patient and/or family.  DVT PROPHYLAXIS: Systemic anticoagulation  GI PROPHYLAXIS: None  ADMISSION STATUS: Observation  CODE STATUS: DNR/DNI Code Status History    Date Active Date Inactive Code Status Order ID Comments User Context   05/04/2015  7:55 PM 05/09/2015  5:51 PM DNR [id number]   Toy Baker, MD Inpatient   04/19/2015 12:24 PM 04/29/2015 11:36 PM Full Code [id number]  Erlene Quan, PA-C ED   11/22/2013  8:07 PM 11/23/2013  5:42 PM Full Code [id number]  Augustin Schooling, MD Inpatient    Questions for Most Recent Historical Code Status (Order [id number])    Question Answer Comment   In the event of cardiac or respiratory ARREST Do not call a "code blue"    In the event of cardiac or respiratory ARREST Do not perform Intubation, CPR, defibrillation  or ACLS    In the event of cardiac or respiratory ARREST Use medication by any route, position, wound care, and other measures to relive pain and suffering. May use oxygen, suction and manual treatment of airway obstruction as needed for comfort.     Advance Directive Documentation        Most Recent Value   Type of Advance Directive  Living will   Pre-existing out of facility DNR order (yellow form or pink MOST form)     "MOST" Form in Place?        TOTAL TIME TAKING CARE OF THIS PATIENT: 50 minutes.    Wagner Tanzi [place] 10/07/2015, 1:02 AM  [facility]  Office  [phone]  CC: Primary care physician; [name], MD

## 2015-10-07 NOTE — Discharge Instructions (Addendum)
°  DIET:  Cardiac diet  DISCHARGE CONDITION:  Stable  ACTIVITY:  Activity as tolerated  OXYGEN:  Home Oxygen: No.   Oxygen Delivery: room air  DISCHARGE LOCATION:  home   If you experience worsening of your admission symptoms, develop shortness of breath, life threatening emergency, suicidal or homicidal thoughts you must seek medical attention immediately by calling 911 or calling your MD immediately  if symptoms less severe.  You Must read complete instructions/literature along with all the possible adverse reactions/side effects for all the Medicines you take and that have been prescribed to you. Take any new Medicines after you have completely understood and accpet all the possible adverse reactions/side effects.   Please note  You were cared for by a hospitalist during your hospital stay. If you have any questions about your discharge medications or the care you received while you were in the hospital after you are discharged, you can call the unit and asked to speak with the hospitalist on call if the hospitalist that took care of you is not available. Once you are discharged, your primary care physician will handle any further medical issues. Please note that NO REFILLS for any discharge medications will be authorized once you are discharged, as it is imperative that you return to your primary care physician (or establish a relationship with a primary care physician if you do not have one) for your aftercare needs so that they can reassess your need for medications and monitor your lab values.   Take time to walk once you stand from Laying or sitting. This will help your body adjust and decrease dizziness.

## 2015-10-08 ENCOUNTER — Telehealth: Payer: Self-pay | Admitting: Interventional Cardiology

## 2015-10-08 NOTE — Discharge Summary (Signed)
[facility] NAME: Patricia Fischer    MR#:  [id number]  DATE OF BIRTH:  February 05, 1956  DATE OF ADMISSION:  10/06/2015 ADMITTING PHYSICIAN: Lance Coon, MD  DATE OF DISCHARGE: 10/07/2015  6:16 PM  PRIMARY CARE PHYSICIAN: [name], MD    ADMISSION DIAGNOSIS:  Low BP  DISCHARGE DIAGNOSIS:  Principal Problem:   Orthostatic hypotension Active Problems:   Chronic systolic CHF (congestive heart failure) (HCC)   Depression, major, recurrent, moderate (HCC)   GERD (gastroesophageal reflux disease)   HTN (hypertension)   History of blood clots   SECONDARY DIAGNOSIS:   Past Medical History  Diagnosis Date  . Essential hypertension   . Asthma   . Environmental allergies   . Pneumonia 2012  . Blood transfusion [facility]  . GERD (gastroesophageal reflux disease)   . Depression   . Breast cancer, left breast ([place]) 2011    DCIS  . Head injury 2011  . Anxiety   . Hemorrhoids   . Obese   . Chronic migraine without aura, with intractable migraine, so stated, with status migrainosus March 2016  . Chronic systolic CHF (congestive heart failure) ([place]) 05/05/2015  . Syncope Aug 2016  . Orthostatic hypotension Aug. 2016  . Pulmonary emboli ([place]) Aug. 2016  . Troponin level elevated Aug. 2016  . Acute systolic heart failure [facility]) Aug 2016  . DVT (deep venous thrombosis) ([place])      ADMITTING HISTORY  Patricia Fischer is a 60 y.o. female who presents with orthostatic hypotension. Patient has a six-month history of intermittent activation of this problem. She states that 6 months ago she had pulmonary embolus and after that time began having presyncopal and syncopal episodes. She was found to have orthostatic hypotension then. After some time symptoms improved. During this time she was also diagnosed with chronic systolic congestive heart failure. Last echocardiogram showed an EF of 35%. She was started on losartan about  one month ago for heart failure treatment, and states that just after that time she began having these episodes of syncope and orthostatic hypotension again. Patient lives alone at home by herself, and has been concerned about her syncopal episodes. She also describes persistent malaise and fatigue during the same time frame. She alerted her cardiologist office to the same, but they wanted to give a long enough trial on the ARB. She went to see her PCP today, and had orthostatic hypotension in the office which seem to be refractory to any initial treatment. She was told to stop her losartan and was sent to the ED for further evaluation. Here she had persistent orthostatic hypotension. She was given 2L bolus of normal saline with no significant effect. At that point hospitalists were called for admission for further workup of her orthostatic hypotension.   HOSPITAL COURSE:   Patient was admitted for orthostatic hypotension. Was started on IV fluids. Monitor on telemetry. Patient losartan and Coreg were held due to drop in blood pressure on standing. Patient's symptoms improved well. She was given instructions to take time to stand from laying position before walking for adjustment. Patient had an echocardiogram which showed no significant abnormalities. Due to Doppler showed nothing acute. No arrhythmias. Cardiac enzymes normal. Patient was discharged home and will follow-up with her cardiologist Dr. Tamala Julian in 1 week.  No focal neurological deficits on examination. No syncope. Ambulating well.   CONSULTS OBTAINED:     DRUG ALLERGIES:   Allergies  Allergen Reactions  . Gadolinium Derivatives Shortness Of Breath and Nausea And Vomiting  . Sulfa Antibiotics Swelling    "Ears swelled up like Dumbo"  . Aripiprazole     Dry mouth with sores   . Codeine Itching and Rash  . Oxycodone Itching  . Penicillins Hives    Has patient had a PCN reaction causing immediate rash, facial/tongue/throat swelling,  SOB or lightheadedness with hypotension: Yes Has patient had a PCN reaction causing severe rash involving mucus membranes or skin necrosis: No Has patient had a PCN reaction that required hospitalization No Has patient had a PCN reaction occurring within the last 10 years: No If all of the above answers are "NO", then may proceed with Cephalosporin use.    DISCHARGE MEDICATIONS:   Discharge Medication List as of 10/07/2015  4:59 PM    CONTINUE these medications which have NOT CHANGED   Details  albuterol (PROVENTIL HFA;VENTOLIN HFA) 108 (90 BASE) MCG/ACT inhaler Inhale 2 puffs into the lungs every 6 (six) hours as needed for shortness of breath. , Until Discontinued, Historical Med    amitriptyline (ELAVIL) 25 MG tablet Take 3 tablets (75 mg total) by mouth at bedtime., Starting 09/06/2015, Until Discontinued, Normal    atorvastatin (LIPITOR) 20 MG tablet Take 1 tablet (20 mg total) by mouth daily at 6 PM., Starting 08/27/2015, Until Discontinued, Normal    fluticasone (FLONASE) 50 MCG/ACT nasal spray Place 1 spray into both nostrils daily as needed for allergies or rhinitis. , Starting 07/04/2014, Until Discontinued, Historical Med    HYDROcodone-acetaminophen (NORCO/VICODIN) 5-325 MG tablet Take 1 tablet by mouth every 6 (six) hours as needed for moderate pain., Starting 10/06/2015, Until Discontinued, Print    rivaroxaban (XARELTO) 20 MG TABS tablet Take 1 tablet (20 mg total) by mouth daily with supper., Starting 06/10/2015, Until Discontinued, No Print    tiZANidine (ZANAFLEX) 2 MG tablet Take 1-2 tablets (2-4 mg total) by mouth at bedtime as needed for muscle spasms., Starting 10/06/2015, Until Discontinued, Print    Blood Pressure Monitoring (BLOOD PRESSURE CUFF) MISC 1 Device by Does not apply route once., Starting 05/20/2015, Print    ondansetron (ZOFRAN ODT) 4 MG disintegrating tablet Take 1 tablet (4 mg total) by mouth every 8 (eight) hours as needed for nausea or vomiting., Starting  03/18/2015, Until Discontinued, Normal      STOP taking these medications     carvedilol (COREG) 6.25 MG tablet          Today    VITAL SIGNS:  Blood pressure 110/65, pulse 60, temperature 98.1 F (36.7 C), temperature source Oral, resp. rate 18, height 5\' 5"  (1.651 m), weight 98.431 kg (217 lb), SpO2 97 %.  I/O:  No intake or output data in the 24 hours ending 10/08/15 1549  PHYSICAL EXAMINATION:  Physical Exam  GENERAL:  60 y.o.-year-old patient lying in the bed with no acute distress.  LUNGS: Normal breath sounds bilaterally, no wheezing, rales,rhonchi or crepitation. No use of accessory muscles of respiration.  CARDIOVASCULAR: S1, S2 normal. No murmurs, rubs, or gallops.  ABDOMEN: Soft, non-tender, non-distended. Bowel sounds present. No organomegaly or mass.  NEUROLOGIC: Moves all 4 extremities. PSYCHIATRIC: The patient is alert and oriented x 3.  SKIN: No obvious rash, lesion, or ulcer.   DATA REVIEW:   CBC  Recent Labs Lab 10/07/15 0620  WBC 6.6  HGB 11.3*  HCT 33.6*  PLT 139*    Chemistries   Recent Labs Lab 10/07/15 0620  NA 138  K 3.2*  CL 105  CO2 30  GLUCOSE 107*  BUN 13  CREATININE 0.72  CALCIUM 8.2*    Cardiac Enzymes  Recent Labs Lab 10/06/15 1235  TROPONINI <0.03    Microbiology Results  Results for orders placed or performed during the hospital encounter of 04/19/15  MRSA PCR Screening     Status: None   Collection Time: 04/19/15  3:35 PM  Result Value Ref Range Status   MRSA by PCR NEGATIVE NEGATIVE Final    Comment:        The GeneXpert MRSA Assay (FDA approved for NASAL specimens only), is one component of a comprehensive MRSA colonization surveillance program. It is not intended to diagnose MRSA infection nor to guide or monitor treatment for MRSA infections.     RADIOLOGY:  Ct Angio Chest Pe W/cm &/or Wo Cm  10/06/2015  CLINICAL DATA:  Shortness of breath. Left-sided pain from neck to shoulder blades. EXAM:  CT ANGIOGRAPHY CHEST WITH CONTRAST TECHNIQUE: Multidetector CT imaging of the chest was performed using the standard protocol during bolus administration of intravenous contrast. Multiplanar CT image reconstructions and MIPs were obtained to evaluate the vascular anatomy. CONTRAST:  100 cc of Isovue 370 COMPARISON:  06/10/2015. FINDINGS: Mediastinum: The heart size appears normal. There is no pericardial effusion. The trachea appears patent and is midline. Unremarkable appearance of the esophagus. No enlarged mediastinal or hilar lymph nodes identified. Mild aortic atherosclerosis. No axillary or supraclavicular adenopathy. The main pulmonary artery is patent. No right or left main pulmonary artery embolus identified. There is no lobar or segmental pulmonary artery filling defects. Lungs/Pleura: No pleural fluid identified. No airspace consolidation or atelectasis. Upper Abdomen: Mild diffuse hepatic steatosis noted previous cholecystectomy. The visualized portions of the spleen, adrenal glands pancreas and kidneys appear normal Musculoskeletal: Degenerative disc disease noted within the lower thoracic spine Review of the MIP images confirms the above findings. IMPRESSION: 1. No acute pulmonary embolus 2. No active cardiopulmonary abnormalities noted. Electronically Signed   By: Kerby Moors M.D.   On: 10/06/2015 18:53   US Carotid Bilateral  10/07/2015  CLINICAL DATA:  Syncopal episode. History of hypertension and hyperlipidemia. EXAM: BILATERAL CAROTID DUPLEX ULTRASOUND TECHNIQUE: Pearline Cables scale imaging, color Doppler and duplex ultrasound were performed of bilateral carotid and vertebral arteries in the neck. COMPARISON:  Head CT - 04/20/2015 FINDINGS: Criteria: Quantification of carotid stenosis is based on velocity parameters that correlate the residual internal carotid diameter with NASCET-based stenosis levels, using the diameter of the distal internal carotid lumen as the denominator for stenosis  measurement. The following velocity measurements were obtained: RIGHT ICA:  73/28 cm/sec CCA:  123XX123 cm/sec SYSTOLIC ICA/CCA RATIO:  0.8 DIASTOLIC ICA/CCA RATIO:  1.0 ECA:  86 cm/sec LEFT ICA:  91/38 cm/sec CCA:  123456 cm/sec SYSTOLIC ICA/CCA RATIO:  0.9 DIASTOLIC ICA/CCA RATIO:  1.3 ECA:  160 cm/sec RIGHT CAROTID ARTERY: There is no significant atherosclerotic plaque or intimal wall thickening affecting the interrogated portions of the right carotid system. There are no elevated peak systolic velocity within the interrogated course of the right internal carotid artery to suggest a hemodynamically significant stenosis. RIGHT VERTEBRAL ARTERY:  Antegrade flow LEFT CAROTID ARTERY: There is no grayscale evidence of significant intimal thickening or atherosclerotic plaque affecting the interrogated portions of the left carotid system. There are no elevated peak systolic velocities within the interrogated course of the left internal carotid artery to suggest a hemodynamically significant stenosis. LEFT VERTEBRAL ARTERY:  Antegrade flow Incidental note is made  of a cardiac arrhythmia (representative images 31, 49, 59 and 64). IMPRESSION: Incidental note is made of a cardiac arrhythmia. Further evaluation with EKG monitoring could be performed as clinically indicated. Otherwise, unremarkable carotid Doppler ultrasound. Electronically Signed   By: Sandi Mariscal M.D.   On: 10/07/2015 14:33      Follow up with PCP in 1 week.  Management plans discussed with the patient, family and they are in agreement.  CODE STATUS:  Code Status History    Date Active Date Inactive Code Status Order ID Comments User Context   10/07/2015  3:25 AM 10/07/2015  9:16 PM DNR [id number]  Lance Coon, MD Inpatient   05/04/2015  7:55 PM 05/09/2015  5:51 PM DNR [id number]  Toy Baker, MD Inpatient   04/19/2015 12:24 PM 04/29/2015 11:36 PM Full Code [id number]  Erlene Quan, PA-C ED   11/22/2013  8:07 PM 11/23/2013  5:42 PM Full Code [id number]   Augustin Schooling, MD Inpatient    Questions for Most Recent Historical Code Status (Order [id number])    Question Answer Comment   In the event of cardiac or respiratory ARREST Do not call a "code blue"    In the event of cardiac or respiratory ARREST Do not perform Intubation, CPR, defibrillation or ACLS    In the event of cardiac or respiratory ARREST Use medication by any route, position, wound care, and other measures to relive pain and suffering. May use oxygen, suction and manual treatment of airway obstruction as needed for comfort.     Advance Directive Documentation        Most Recent Value   Type of Advance Directive  Living will   Pre-existing out of facility DNR order (yellow form or pink MOST form)     "MOST" Form in Place?        TOTAL TIME TAKING CARE OF THIS PATIENT ON DAY OF DISCHARGE: more than 30 minutes.    Hillary Bow R M.D on 10/08/2015 at 3:49 PM  Between 7am to 6pm - Pager - [phone]  After 6pm go to [email or web address] - password [email or web address] [facility]  Office  [phone]  CC: Primary care physician; [name], MD  Note: This dictation was prepared with Dragon dictation along with smaller phrase technology. Any transcriptional errors that result from this process are unintentional.

## 2015-10-08 NOTE — Telephone Encounter (Signed)
Returned pt call. Pt sts that she was seen at  [facility], Carvedilol and Losartan has been d/c.pt wants to know if she should stay off Carvedilol.adv pt I will fwd a message to Dr.Smith to adv whether or not she should resume Carvedilol. Pt sts that she is doing ok today, it was recommended that she f/u with cardiology, should her appt be moved up? Adv pt I will call her when I receive Dr.Smith's response. Pt agreeable with plan and verbalized understanding.

## 2015-10-08 NOTE — Telephone Encounter (Signed)
Pt was admitted to [facility]. They took her off of her Carvedilol.Does he wants her to stay off of it?

## 2015-10-14 NOTE — Telephone Encounter (Signed)
I don't know why  the medications were discontinued. If these medications at discontinued her heart will continue to get weak. We stopped the medication? Why was the medication stopped ?

## 2015-10-18 DIAGNOSIS — M25512 Pain in left shoulder: Secondary | ICD-10-CM | POA: Insufficient documentation

## 2015-10-18 DIAGNOSIS — M542 Cervicalgia: Secondary | ICD-10-CM | POA: Insufficient documentation

## 2015-10-18 NOTE — Assessment & Plan Note (Signed)
Discussed with vascular doctor; not sure if she has a DVT or if musculoskeletal; she is going to the ER

## 2015-10-18 NOTE — Assessment & Plan Note (Signed)
LVEF 35%, managed by cardiologist

## 2015-10-18 NOTE — Assessment & Plan Note (Signed)
Chronic pain which we believe is secondary to her PE; awaiting pain clinic appointment; she was in tears explaining the pain she has and denies red flags; Rx provided and we'll see again about this referral; red flags reviewed again, reasons to contact me; do NOT drink, take anxiety pills, other pain pills, etc.

## 2015-10-18 NOTE — Assessment & Plan Note (Signed)
Sending her to the ER; she is too weak to stand for vitals, and has been having presyncope after addition of BP med by cardiologist

## 2015-10-19 NOTE — Telephone Encounter (Signed)
It was stopped at [facility] ED on 10/06/15. For orthostatic hypotension, and syncope. Should she resume ned

## 2015-10-20 NOTE — Telephone Encounter (Signed)
Resume carvedilol 3.125 mg daily. In 2 weeks, start Loasartan 25 mg daily

## 2015-10-20 NOTE — Telephone Encounter (Signed)
Called to give pt Dr.Smith's response below. [email or web address] 

## 2015-10-22 MED ORDER — CARVEDILOL 3.125 MG PO TABS: 3.1250 mg | ORAL_TABLET | Freq: Two times a day (BID) | ORAL | Status: DC

## 2015-10-22 MED ORDER — LOSARTAN POTASSIUM 25 MG PO TABS: 25.0000 mg | ORAL_TABLET | Freq: Every day | ORAL | Status: DC

## 2015-10-22 NOTE — Telephone Encounter (Signed)
Pt aware of Dr.Smith' response. Resume carvedilol 3.125 mg daily. In 2 weeks, start Loasartan 25 mg daily. Pt sts that she still has the medication at home. Pt agreeable wand verbalized understanding. She will resume as instructed.

## 2015-10-23 ENCOUNTER — Ambulatory Visit: Payer: Self-pay | Admitting: Family Medicine

## 2015-10-27 ENCOUNTER — Observation Stay (HOSPITAL_COMMUNITY)
Admission: EM | Admit: 2015-10-27 | Discharge: 2015-10-31 | Disposition: A | Discharge status: Home / Self Care | Payer: Medicare Other | Attending: Cardiovascular Disease | Admitting: Cardiovascular Disease

## 2015-10-27 ENCOUNTER — Ambulatory Visit (INDEPENDENT_AMBULATORY_CARE_PROVIDER_SITE_OTHER): Payer: Medicare Other | Admitting: Family Medicine

## 2015-10-27 ENCOUNTER — Emergency Department (HOSPITAL_COMMUNITY): Payer: Medicare Other

## 2015-10-27 ENCOUNTER — Encounter (HOSPITAL_COMMUNITY): Payer: Self-pay | Admitting: Emergency Medicine

## 2015-10-27 ENCOUNTER — Encounter: Payer: Self-pay | Admitting: Family Medicine

## 2015-10-27 VITALS — BP 136/93 | HR 101 | Temp 97.1°F | Ht 64.0 in | Wt 223.0 lb

## 2015-10-27 DIAGNOSIS — J45909 Unspecified asthma, uncomplicated: Secondary | ICD-10-CM | POA: Insufficient documentation

## 2015-10-27 DIAGNOSIS — F329 Major depressive disorder, single episode, unspecified: Secondary | ICD-10-CM | POA: Diagnosis not present

## 2015-10-27 DIAGNOSIS — Z7901 Long term (current) use of anticoagulants: Secondary | ICD-10-CM | POA: Insufficient documentation

## 2015-10-27 DIAGNOSIS — E785 Hyperlipidemia, unspecified: Secondary | ICD-10-CM | POA: Insufficient documentation

## 2015-10-27 DIAGNOSIS — I110 Hypertensive heart disease with heart failure: Secondary | ICD-10-CM | POA: Diagnosis not present

## 2015-10-27 DIAGNOSIS — R079 Chest pain, unspecified: Secondary | ICD-10-CM | POA: Insufficient documentation

## 2015-10-27 DIAGNOSIS — I2692 Saddle embolus of pulmonary artery without acute cor pulmonale: Secondary | ICD-10-CM

## 2015-10-27 DIAGNOSIS — R0602 Shortness of breath: Secondary | ICD-10-CM | POA: Diagnosis not present

## 2015-10-27 DIAGNOSIS — I10 Essential (primary) hypertension: Secondary | ICD-10-CM | POA: Diagnosis present

## 2015-10-27 DIAGNOSIS — Z853 Personal history of malignant neoplasm of breast: Secondary | ICD-10-CM | POA: Diagnosis not present

## 2015-10-27 DIAGNOSIS — I951 Orthostatic hypotension: Secondary | ICD-10-CM | POA: Insufficient documentation

## 2015-10-27 DIAGNOSIS — I5022 Chronic systolic (congestive) heart failure: Secondary | ICD-10-CM

## 2015-10-27 DIAGNOSIS — R51 Headache: Secondary | ICD-10-CM | POA: Insufficient documentation

## 2015-10-27 DIAGNOSIS — K219 Gastro-esophageal reflux disease without esophagitis: Secondary | ICD-10-CM | POA: Diagnosis not present

## 2015-10-27 DIAGNOSIS — Z7982 Long term (current) use of aspirin: Secondary | ICD-10-CM | POA: Insufficient documentation

## 2015-10-27 DIAGNOSIS — R0609 Other forms of dyspnea: Secondary | ICD-10-CM

## 2015-10-27 DIAGNOSIS — I428 Other cardiomyopathies: Secondary | ICD-10-CM | POA: Diagnosis not present

## 2015-10-27 DIAGNOSIS — Z86718 Personal history of other venous thrombosis and embolism: Secondary | ICD-10-CM | POA: Insufficient documentation

## 2015-10-27 DIAGNOSIS — Z86711 Personal history of pulmonary embolism: Secondary | ICD-10-CM | POA: Diagnosis not present

## 2015-10-27 DIAGNOSIS — Z8249 Family history of ischemic heart disease and other diseases of the circulatory system: Secondary | ICD-10-CM | POA: Insufficient documentation

## 2015-10-27 DIAGNOSIS — I429 Cardiomyopathy, unspecified: Secondary | ICD-10-CM | POA: Diagnosis not present

## 2015-10-27 DIAGNOSIS — I2510 Atherosclerotic heart disease of native coronary artery without angina pectoris: Secondary | ICD-10-CM | POA: Diagnosis not present

## 2015-10-27 DIAGNOSIS — R519 Headache, unspecified: Secondary | ICD-10-CM

## 2015-10-27 DIAGNOSIS — I5020 Unspecified systolic (congestive) heart failure: Secondary | ICD-10-CM | POA: Diagnosis present

## 2015-10-27 DIAGNOSIS — F419 Anxiety disorder, unspecified: Secondary | ICD-10-CM | POA: Diagnosis not present

## 2015-10-27 DIAGNOSIS — Z79899 Other long term (current) drug therapy: Secondary | ICD-10-CM | POA: Diagnosis not present

## 2015-10-27 DIAGNOSIS — E669 Obesity, unspecified: Secondary | ICD-10-CM | POA: Diagnosis not present

## 2015-10-27 DIAGNOSIS — G43711 Chronic migraine without aura, intractable, with status migrainosus: Secondary | ICD-10-CM

## 2015-10-27 DIAGNOSIS — I2782 Chronic pulmonary embolism: Secondary | ICD-10-CM

## 2015-10-27 HISTORY — DX: Reserved for inherently not codable concepts without codable children: IMO0001

## 2015-10-27 HISTORY — DX: Saddle embolus of pulmonary artery without acute cor pulmonale: I26.92

## 2015-10-27 HISTORY — DX: Obesity, unspecified: E66.9

## 2015-10-27 HISTORY — DX: Left bundle-branch block, unspecified: I44.7

## 2015-10-27 HISTORY — DX: Family history of other specified conditions: Z84.89

## 2015-10-27 LAB — COMPREHENSIVE METABOLIC PANEL
ALBUMIN: 3.7 g/dL (ref 3.5–5.0)
ALT: 15 U/L (ref 14–54)
AST: 23 U/L (ref 15–41)
Alkaline Phosphatase: 98 U/L (ref 38–126)
Anion gap: 12 (ref 5–15)
BILIRUBIN TOTAL: 0.5 mg/dL (ref 0.3–1.2)
BUN: 7 mg/dL (ref 6–20)
CO2: 28 mmol/L (ref 22–32)
CREATININE: 0.92 mg/dL (ref 0.44–1.00)
Calcium: 9.4 mg/dL (ref 8.9–10.3)
Chloride: 102 mmol/L (ref 101–111)
GFR calc Af Amer: >60 mL/min (ref >60)
GFR calc non Af Amer: >60 mL/min (ref >60)
GLUCOSE: 101 mg/dL — AB (ref 65–99)
POTASSIUM: 3.9 mmol/L (ref 3.5–5.1)
Sodium: 142 mmol/L (ref 135–145)
TOTAL PROTEIN: 7.4 g/dL (ref 6.5–8.1)

## 2015-10-27 LAB — BRAIN NATRIURETIC PEPTIDE: B Natriuretic Peptide: 26.3 pg/mL (ref 0.0–100.0)

## 2015-10-27 LAB — CBC WITH DIFFERENTIAL/PLATELET
BASOS ABS: 0 10*3/uL (ref 0.0–0.1)
Basophils Relative: 0 %
EOS PCT: 5 %
Eosinophils Absolute: 0.4 10*3/uL (ref 0.0–0.7)
HEMATOCRIT: 40.6 % (ref 36.0–46.0)
Hemoglobin: 13 g/dL (ref 12.0–15.0)
LYMPHS ABS: 2.7 10*3/uL (ref 0.7–4.0)
LYMPHS PCT: 35 %
MCH: 27.7 pg (ref 26.0–34.0)
MCHC: 32 g/dL (ref 30.0–36.0)
MCV: 86.4 fL (ref 78.0–100.0)
MONO ABS: 0.4 10*3/uL (ref 0.1–1.0)
MONOS PCT: 6 %
NEUTROS ABS: 4.3 10*3/uL (ref 1.7–7.7)
Neutrophils Relative %: 54 %
PLATELETS: 223 10*3/uL (ref 150–400)
RBC: 4.7 MIL/uL (ref 3.87–5.11)
RDW: 14 % (ref 11.5–15.5)
WBC: 7.9 10*3/uL (ref 4.0–10.5)

## 2015-10-27 LAB — I-STAT TROPONIN, ED
Troponin i, poc: 0 ng/mL (ref 0.00–0.08)
Troponin i, poc: 0 ng/mL (ref 0.00–0.08)

## 2015-10-27 LAB — PROTIME-INR
INR: 1.09 (ref 0.00–1.49)
PROTHROMBIN TIME: 14.3 s (ref 11.6–15.2)

## 2015-10-27 LAB — TSH: TSH: 1.095 u[IU]/mL (ref 0.350–4.500)

## 2015-10-27 LAB — TROPONIN I: Troponin I: <0.03 ng/mL (ref <0.031)

## 2015-10-27 MED ORDER — PROCHLORPERAZINE EDISYLATE 5 MG/ML IJ SOLN
10.0000 mg | Freq: Once | INTRAMUSCULAR | Status: AC
  Administered 2015-10-27: 10 mg via INTRAVENOUS
  Filled 2015-10-27: qty 2

## 2015-10-27 MED ORDER — BUTALBITAL-APAP-CAFFEINE 50-325-40 MG PO TABS: 1.0000 | ORAL_TABLET | Freq: Four times a day (QID) | ORAL | Status: DC | PRN

## 2015-10-27 MED ORDER — ATORVASTATIN CALCIUM 20 MG PO TABS
20.0000 mg | ORAL_TABLET | Freq: Every day | ORAL | Status: DC
  Administered 2015-10-28 – 2015-10-30 (×3): 20 mg via ORAL
  Filled 2015-10-27 (×3): qty 1

## 2015-10-27 MED ORDER — FAMOTIDINE IN NACL 20-0.9 MG/50ML-% IV SOLN
20.0000 mg | INTRAVENOUS | Status: DC
  Filled 2015-10-27 (×2): qty 50

## 2015-10-27 MED ORDER — ONDANSETRON 4 MG PO TBDP
4.0000 mg | ORAL_TABLET | Freq: Three times a day (TID) | ORAL | Status: DC | PRN
  Filled 2015-10-27: qty 1

## 2015-10-27 MED ORDER — ASPIRIN EC 81 MG PO TBEC
81.0000 mg | DELAYED_RELEASE_TABLET | Freq: Every day | ORAL | Status: DC
  Administered 2015-10-29 – 2015-10-31 (×3): 81 mg via ORAL
  Filled 2015-10-27 (×4): qty 1

## 2015-10-27 MED ORDER — METHYLPREDNISOLONE SODIUM SUCC 125 MG IJ SOLR
125.0000 mg | Freq: Once | INTRAMUSCULAR | Status: AC
  Administered 2015-10-27: 125 mg via INTRAVENOUS
  Filled 2015-10-27: qty 2

## 2015-10-27 MED ORDER — ALBUTEROL SULFATE (2.5 MG/3ML) 0.083% IN NEBU: 2.5000 mg | INHALATION_SOLUTION | Freq: Four times a day (QID) | RESPIRATORY_TRACT | Status: DC | PRN

## 2015-10-27 MED ORDER — FUROSEMIDE 10 MG/ML IJ SOLN
40.0000 mg | Freq: Once | INTRAMUSCULAR | Status: AC
  Administered 2015-10-27: 40 mg via INTRAVENOUS
  Filled 2015-10-27: qty 4

## 2015-10-27 MED ORDER — AMITRIPTYLINE HCL 75 MG PO TABS
75.0000 mg | ORAL_TABLET | Freq: Every day | ORAL | Status: DC
  Administered 2015-10-27 – 2015-10-30 (×3): 75 mg via ORAL
  Filled 2015-10-27 (×5): qty 1

## 2015-10-27 MED ORDER — PREDNISONE 50 MG PO TABS
60.0000 mg | ORAL_TABLET | Freq: Once | ORAL | Status: AC
  Administered 2015-10-27: 60 mg via ORAL
  Filled 2015-10-27: qty 1

## 2015-10-27 MED ORDER — ALBUTEROL SULFATE HFA 108 (90 BASE) MCG/ACT IN AERS: 2.0000 | INHALATION_SPRAY | Freq: Four times a day (QID) | RESPIRATORY_TRACT | Status: DC | PRN

## 2015-10-27 MED ORDER — SODIUM CHLORIDE 0.9% FLUSH: 3.0000 mL | INTRAVENOUS | Status: DC | PRN

## 2015-10-27 MED ORDER — PREDNISONE 50 MG PO TABS: 60.0000 mg | ORAL_TABLET | Freq: Once | ORAL | Status: DC

## 2015-10-27 MED ORDER — KETOROLAC TROMETHAMINE 30 MG/ML IJ SOLN
30.0000 mg | Freq: Once | INTRAMUSCULAR | Status: AC
  Administered 2015-10-27: 30 mg via INTRAVENOUS
  Filled 2015-10-27: qty 1

## 2015-10-27 MED ORDER — HEPARIN (PORCINE) IN NACL 100-0.45 UNIT/ML-% IJ SOLN
1300.0000 [IU]/h | INTRAMUSCULAR | Status: DC
  Administered 2015-10-27: 1200 [IU]/h via INTRAVENOUS
  Administered 2015-10-28: 1300 [IU]/h via INTRAVENOUS
  Filled 2015-10-27 (×2): qty 250

## 2015-10-27 MED ORDER — ACETAMINOPHEN 325 MG PO TABS
650.0000 mg | ORAL_TABLET | Freq: Once | ORAL | Status: AC
  Administered 2015-10-27: 650 mg via ORAL
  Filled 2015-10-27: qty 2

## 2015-10-27 MED ORDER — DIPHENHYDRAMINE HCL 50 MG/ML IJ SOLN
25.0000 mg | Freq: Once | INTRAMUSCULAR | Status: AC
  Administered 2015-10-27: 25 mg via INTRAVENOUS
  Filled 2015-10-27: qty 1

## 2015-10-27 MED ORDER — ASPIRIN 81 MG PO CHEW
81.0000 mg | CHEWABLE_TABLET | ORAL | Status: AC
  Administered 2015-10-28: 81 mg via ORAL
  Filled 2015-10-27: qty 1

## 2015-10-27 MED ORDER — SODIUM CHLORIDE 0.9 % IV SOLN: 250.0000 mL | INTRAVENOUS | Status: DC | PRN

## 2015-10-27 MED ORDER — ACETAMINOPHEN 325 MG PO TABS
650.0000 mg | ORAL_TABLET | ORAL | Status: DC | PRN
  Administered 2015-10-27 – 2015-10-28 (×2): 650 mg via ORAL
  Filled 2015-10-27 (×2): qty 2

## 2015-10-27 MED ORDER — SODIUM CHLORIDE 0.9 % IV SOLN
INTRAVENOUS | Status: DC
Start: 2015-10-28 — End: 2015-10-28
  Administered 2015-10-28: 08:00:00 via INTRAVENOUS

## 2015-10-27 MED ORDER — SODIUM CHLORIDE 0.9% FLUSH
3.0000 mL | Freq: Two times a day (BID) | INTRAVENOUS | Status: DC
  Administered 2015-10-28 – 2015-10-29 (×2): 3 mL via INTRAVENOUS

## 2015-10-27 MED ORDER — TIZANIDINE HCL 4 MG PO TABS
2.0000 mg | ORAL_TABLET | Freq: Every evening | ORAL | Status: DC | PRN
  Administered 2015-10-28 – 2015-10-29 (×2): 4 mg via ORAL
  Filled 2015-10-27 (×2): qty 1

## 2015-10-27 MED ORDER — HEPARIN BOLUS VIA INFUSION
3500.0000 [IU] | Freq: Once | INTRAVENOUS | Status: AC
  Administered 2015-10-27: 3500 [IU] via INTRAVENOUS
  Filled 2015-10-27: qty 3500

## 2015-10-27 MED ORDER — PREDNISONE 50 MG PO TABS
60.0000 mg | ORAL_TABLET | Freq: Once | ORAL | Status: AC
  Administered 2015-10-28: 60 mg via ORAL
  Filled 2015-10-27: qty 1

## 2015-10-27 MED ORDER — CARVEDILOL 3.125 MG PO TABS
3.1250 mg | ORAL_TABLET | Freq: Two times a day (BID) | ORAL | Status: DC
  Administered 2015-10-27: 3.125 mg via ORAL
  Filled 2015-10-27: qty 1

## 2015-10-27 MED ORDER — HYDROCODONE-ACETAMINOPHEN 5-325 MG PO TABS
1.0000 | ORAL_TABLET | Freq: Four times a day (QID) | ORAL | Status: DC | PRN
  Administered 2015-10-27 – 2015-10-30 (×10): 1 via ORAL
  Filled 2015-10-27 (×10): qty 1

## 2015-10-27 MED ORDER — FLUTICASONE PROPIONATE 50 MCG/ACT NA SUSP: 1.0000 | Freq: Every day | NASAL | Status: DC | PRN

## 2015-10-27 MED ORDER — DIPHENHYDRAMINE HCL 50 MG/ML IJ SOLN: 25.0000 mg | INTRAMUSCULAR | Status: DC

## 2015-10-27 MED ORDER — IOHEXOL 350 MG/ML SOLN
100.0000 mL | Freq: Once | INTRAVENOUS | Status: AC | PRN
  Administered 2015-10-27: 100 mL via INTRAVENOUS

## 2015-10-27 MED ORDER — PROCHLORPERAZINE MALEATE 10 MG PO TABS: 10.0000 mg | ORAL_TABLET | Freq: Two times a day (BID) | ORAL | Status: DC | PRN

## 2015-10-27 NOTE — ED Notes (Signed)
Pt would like to clarify that she does not want to be intubated in such incident she may need this, however she would like to have CPR if she requires it.

## 2015-10-27 NOTE — ED Provider Notes (Signed)
60 y.o F with hx of PE on xarelto, CHF presents to ED c/o SOB and worsening DOE. Pt was signed out to me at shift change pending delta troponin and cardiology consult. Chest pain was felt to be chronic secondary to PE. Pt has been compliant with Xarelto. CT negative for PE. EKG unchanged. Initial troponin negative.   Delta troponin negative. Cardiology consulted pt in ED and plan to admit pt for heart catheterization tomorrow. See cardiology note.   Dondra Spry [place], PA-C 10/27/15 1733  Virgel Manifold, MD 10/29/15 [phone]

## 2015-10-27 NOTE — ED Notes (Signed)
Pt received 324 aspirin and 1 0.4 nitro in route.

## 2015-10-27 NOTE — Progress Notes (Signed)
The patient required premed for her CTA due to possible contrast allergy. Per d/w MD will rx prednisone 60mg  tonight, in AM, and noon. Pre-cath fluids ordered for tomorrow AM 50/hr starting at 9am. If cath is delayed for any reason until the following day, the rounding team should consider capping IVF and re-ordering for the following day.  I also clarified with the patient she wishes to remain DNR this admission as in prior admissions. Patricia Bridgett PA-C

## 2015-10-27 NOTE — Patient Instructions (Signed)
Please go from here right to the ER

## 2015-10-27 NOTE — Progress Notes (Signed)
ANTICOAGULATION CONSULT NOTE - Initial Consult  Pharmacy Consult for Heparin Indication: History saddle PE and DVT  Allergies  Allergen Reactions  . Gadolinium Derivatives Shortness Of Breath and Nausea And Vomiting  . Sulfa Antibiotics Swelling    "Ears swelled up like Dumbo"  . Aripiprazole     Dry mouth with sores   . Codeine Itching and Rash  . Oxycodone Itching  . Penicillins Hives    Has patient had a PCN reaction causing immediate rash, facial/tongue/throat swelling, SOB or lightheadedness with hypotension: Yes Has patient had a PCN reaction causing severe rash involving mucus membranes or skin necrosis: No Has patient had a PCN reaction that required hospitalization No Has patient had a PCN reaction occurring within the last 10 years: No If all of the above answers are "NO", then may proceed with Cephalosporin use.    Patient Measurements: Weight: 220 lb 14.4 oz (100.2 kg) Heparin Dosing Weight:   Vital Signs: Temp: 98.6 F (37 C) (02/21 1855) Temp Source: Oral (02/21 1855) BP: 131/104 mmHg (02/21 1855) Pulse Rate: 99 (02/21 1855)  Labs:  Recent Labs  10/27/15 1134  HGB 13.0  HCT 40.6  PLT 223  LABPROT 14.3  INR 1.09  CREATININE 0.92    Estimated Creatinine Clearance: 74.8 mL/min (by C-G formula based on Cr of 0.92).   Medical History: Past Medical History  Diagnosis Date  . Essential hypertension   . Asthma   . Environmental allergies   . Pneumonia 2012  . Blood transfusion [place]  . GERD (gastroesophageal reflux disease)   . Depression   . Breast cancer, left breast ([place]) 2011    DCIS  . Head injury 2011  . Anxiety   . Hemorrhoids   . Obesity   . Chronic migraine without aura, with intractable migraine, so stated, with status migrainosus March 2016  . Chronic systolic CHF (congestive heart failure) ([place]) 05/05/2015    a. Dx 04/2015 - EF initially 25-30% with RV dysfunction, f/u echo 08/2015 technically difficult, EF 35-40%,  anterior, anteroseptal, apical and inferoapical severe hypokinesis suggestive of LAD territory ischemia/infarct, grade 1 DD, mild MR, RV normal.  . Syncope     a. Recurrent syncope in 2016 felt 2/2 orthostasis in setting of PE.  . Orthostatic hypotension   . Saddle pulmonary embolus ([place]) Aug. 2016    a. Dx 04/2015 - patient returned 05/2015 after noncompliance with Xarelto.  Marland Kitchen DVT (deep venous thrombosis) ([place]) Aug. 2016    a. Dx 04/2015 - patient returned 05/2015 after noncompliance with Xarelto.  Marland Kitchen LBBB (left bundle branch block)     Medications:  Scheduled:  . amitriptyline  75 mg Oral QHS  . [START ON 10/28/2015] aspirin  81 mg Oral Pre-Cath  . [START ON 10/28/2015] aspirin EC  81 mg Oral Daily  . [START ON 10/28/2015] atorvastatin  20 mg Oral q1800  . carvedilol  3.125 mg Oral BID WC  . [START ON 10/28/2015] diphenhydrAMINE  25 mg Intravenous Pre-Cath  . [START ON 10/28/2015] famotidine (PEPCID) IV  20 mg Intravenous Pre-Cath  . predniSONE  60 mg Oral Once   Followed by  . [START ON 10/28/2015] predniSONE  60 mg Oral Once   Followed by  . [START ON 10/28/2015] predniSONE  60 mg Oral Once  . sodium chloride flush  3 mL Intravenous Q12H    Assessment: 60yo female presenting with SOB and chronic daily chest pain, and history of saddle PE + DVT in Aug  2016.  She was on Xarelto 20mg  daily at home, with her last dose on 2/20.  Pt is to start Heparin bridge with plan for R & L heart cath on 2/22.  Will monitor aPTT and Heparin level until Xarelto effect has worn off.  Cr < 1, CrCl ~70-44ml/min Hg and pltc wnl INR 1.09  Goal of Therapy:  Heparin level 0.3-0.7 APTT 66-102sec Monitor platelets by anticoagulation protocol: Yes   Plan:  Heparin 3500 units IV x 1, then 1200 units/hr Heparin level and aPTT in 8hr Daily HL, aPTT, and CBC Watch for s/s of bleeding  Gracy Bruins, PharmD [facility]

## 2015-10-27 NOTE — ED Provider Notes (Signed)
CSN: [id number]     Arrival date & time 10/27/15  1045 History   First MD Initiated Contact with Patient 10/27/15 1059     Chief Complaint  Patient presents with  . Chest Pain  . Shortness of Breath     (Consider location/radiation/quality/duration/timing/severity/associated sxs/prior Treatment) HPI   Blood pressure 111/93, pulse 120, resp. rate 25, SpO2 99 %.  Patricia Fischer is a 60 y.o. female sent from PCP with past medical history significant for PE (taking Xarelto regularly), systolic CHF (last EF is [id number]), obesity complaining of shortness of breath and dyspnea on exertion worsening over the last 2 days. Patient has chronic left-sided chest pain which has been consistent since her pulmonary embolism which she had in August 2016, no recent changes. Patient denies fever, chills, cough, syncope, orthopnea (Above her baseline, uses 2 pillows), PND, increasing peripheral edema. On review of systems patient notes that she's been having difficulty sleeping and she had a traumatic bruise to the right medial anterior shin notice last week which is now largely resolved. Does not take any diuretics. Patient notes a 6 pound weight gain for the last 20 days and she is perplexed by this her diet hasn't changed. She will receive full dose aspirin and one sublingual nitroglycerin in route.  She had a repeat since admission for orthostatic hypotension, her high blood pressure medications were reduced recently with excellent resolution of symptoms.  Past Medical History  Diagnosis Date  . Essential hypertension   . Asthma   . Environmental allergies   . Pneumonia 2012  . Blood transfusion [place]  . GERD (gastroesophageal reflux disease)   . Depression   . Breast cancer, left breast ([place]) 2011    DCIS  . Head injury 2011  . Anxiety   . Hemorrhoids   . Obese   . Chronic migraine without aura, with intractable migraine, so stated, with status migrainosus March 2016  . Chronic systolic  CHF (congestive heart failure) ([place]) 05/05/2015  . Syncope Aug 2016  . Orthostatic hypotension Aug. 2016  . Pulmonary emboli ([place]) Aug. 2016  . Troponin level elevated Aug. 2016  . Acute systolic heart failure [facility]) Aug 2016  . DVT (deep venous thrombosis) [facility])    Past Surgical History  Procedure Laterality Date  . Breast surgery Left 2011    Breast lumpectomy  . Cardiac catheterization  2010    [facility]; pt states it was "clear"  . Tonsillectomy    . Appendectomy    . Cholecystectomy    . Tubal ligation    . Anal fissure repair    . Knee cartilage surgery Left   . Carpal tunnel release Bilateral   . Tarsal tunnel release Left 1990  . Anterior cervical decomp/discectomy fusion  01/23/2012    Procedure: ANTERIOR CERVICAL DECOMPRESSION/DISCECTOMY FUSION 1 LEVEL;  Surgeon: Elaina Hoops, MD;  Location: [place] NEURO ORS;  Service: Neurosurgery;  Laterality: N/A;  Cervical five-six anterior cervical discectomy fusion with plating  . Cesarean section  1982, 1979  . Abdominal hysterectomy  1986  . Colonoscopy  01/16/2009    Dr Bary Castilla  . Shoulder arthroscopy with subacromial decompression, rotator cuff repair and bicep tendon repair Right 11/22/2013    Procedure: RIGHT SHOULDER ATHROSCOPY OPEN SUBSCAP REPAIR DELTA-PECTORAL ;  Surgeon: Augustin Schooling, MD;  Location: [place];  Service: Orthopedics;  Laterality: Right;   Family History  Problem Relation Age of Onset  . Anesthesia problems Neg Hx   .  Diabetes Neg Hx   . Breast cancer Paternal Aunt   . Breast cancer Mother   . Migraines Mother   . Cancer Mother   . Hypertension Mother   . Rectal cancer Father   . Cancer Father   . COPD Father   . Heart disease Paternal Grandmother   . Stroke Paternal Grandmother   . Heart attack Paternal Grandmother   . Aneurysm Paternal Grandfather   . COPD Paternal Grandfather    Social History  Substance Use Topics  . Smoking status: Never Smoker   . Smokeless tobacco: Never Used  . Alcohol  Use: No     Comment: occasional wine   OB History    No data available     Review of Systems  10 systems reviewed and found to be negative, except as noted in the HPI.   Allergies  Gadolinium derivatives; Sulfa antibiotics; Aripiprazole; Codeine; Oxycodone; and Penicillins  Home Medications   Prior to Admission medications   Medication Sig Start Date End Date Taking? Authorizing Provider  albuterol (PROVENTIL HFA;VENTOLIN HFA) 108 (90 BASE) MCG/ACT inhaler Inhale 2 puffs into the lungs every 6 (six) hours as needed for shortness of breath.    Yes Historical Provider, MD  amitriptyline (ELAVIL) 25 MG tablet Take 3 tablets (75 mg total) by mouth at bedtime. 09/06/15  Yes Kathrynn Ducking, MD  aspirin EC 81 MG tablet Take 324 mg by mouth once.   Yes Historical Provider, MD  atorvastatin (LIPITOR) 20 MG tablet Take 1 tablet (20 mg total) by mouth daily at 6 PM. 08/27/15  Yes Belva Crome, MD  Blood Pressure Monitoring (BLOOD PRESSURE CUFF) MISC 1 Device by Does not apply route once. 05/20/15  Yes Rhonda G Barrett, PA-C  carvedilol (COREG) 6.25 MG tablet Take 0.5 tablets by mouth 2 (two) times daily. Take 1/2 of tablet twice daily 09/02/15  Yes Historical Provider, MD  fluticasone (FLONASE) 50 MCG/ACT nasal spray Place 1 spray into both nostrils daily as needed for allergies or rhinitis.  07/04/14  Yes Historical Provider, MD  HYDROcodone-acetaminophen (NORCO/VICODIN) 5-325 MG tablet Take 1 tablet by mouth every 6 (six) hours as needed for moderate pain. 10/06/15  Yes Arnetha Courser, MD  losartan (COZAAR) 50 MG tablet Take 25 mg by mouth daily. Take 1/2 tablet by mouth daily 09/04/15  Yes Historical Provider, MD  nitroGLYCERIN (NITROSTAT) 0.4 MG SL tablet Place 0.4 mg under the tongue every 5 (five) minutes as needed for chest pain.   Yes Historical Provider, MD  ondansetron (ZOFRAN ODT) 4 MG disintegrating tablet Take 1 tablet (4 mg total) by mouth every 8 (eight) hours as needed for nausea or  vomiting. 03/18/15  Yes Kathrynn Ducking, MD  rivaroxaban (XARELTO) 20 MG TABS tablet Take 1 tablet (20 mg total) by mouth daily with supper. 06/10/15  Yes Arnetha Courser, MD  tiZANidine (ZANAFLEX) 2 MG tablet Take 1-2 tablets (2-4 mg total) by mouth at bedtime as needed for muscle spasms. 10/06/15  Yes Arnetha Courser, MD   BP 132/87 mmHg  Pulse 91  Temp(Src) 99.3 F (37.4 C) (Oral)  Resp 23  SpO2 96% Physical Exam  Constitutional: She is oriented to person, place, and time. She appears well-developed and well-nourished. No distress.  HENT:  Head: Normocephalic.  Mouth/Throat: Oropharynx is clear and moist.  Eyes: Conjunctivae and EOM are normal.  Neck: Normal range of motion. No JVD present. No tracheal deviation present.  Cardiovascular: Normal rate, regular rhythm and  intact distal pulses.   Radial pulse equal bilaterally  Pulmonary/Chest: Effort normal and breath sounds normal. No stridor. No respiratory distress. She has no wheezes. She has no rales. She exhibits no tenderness.  Abdominal: Soft. She exhibits no distension and no mass. There is no tenderness. There is no rebound and no guarding.  Musculoskeletal: Normal range of motion. She exhibits no edema or tenderness.       Legs: No calf asymmetry, superficial collaterals, palpable cords, edema, Homans sign negative bilaterally.     Neurological: She is alert and oriented to person, place, and time.     Skin: Skin is warm. She is not diaphoretic.  Psychiatric: She has a normal mood and affect.  Nursing note and vitals reviewed.   ED Course  Procedures (including critical care time) Labs Review Labs Reviewed  COMPREHENSIVE METABOLIC PANEL - Abnormal; Notable for the following:    Glucose, Bld 101 (*)    All other components within normal limits  BRAIN NATRIURETIC PEPTIDE  CBC WITH DIFFERENTIAL/PLATELET  PROTIME-INR  Randolm Idol, ED  Randolm Idol, ED    Imaging Review Dg Chest 2 View  10/27/2015   CLINICAL DATA:  Shortness of breath.  Chest pain on the left EXAM: CHEST  2 VIEW COMPARISON:  10/06/2015 FINDINGS: Normal heart size and stable aortic tortuosity.  Negative hila. Chronic elevation of the right diaphragm. There is no edema, consolidation, effusion, or pneumothorax. Cholecystectomy clips. No acute osseous finding. IMPRESSION: Stable.  No acute finding. Electronically Signed   By: Monte Fantasia M.D.   On: 10/27/2015 12:07   Ct Angio Chest Pe W/cm &/or Wo Cm  10/27/2015  CLINICAL DATA:  SOB x 3 days. Hx pulmonary emboli, asthma, HTN, breast cancer, pneumonia, and CHF. EXAM: CT ANGIOGRAPHY CHEST WITH CONTRAST TECHNIQUE: Multidetector CT imaging of the chest was performed using the standard protocol during bolus administration of intravenous contrast. Multiplanar CT image reconstructions and MIPs were obtained to evaluate the vascular anatomy. CONTRAST:  110mL OMNIPAQUE IOHEXOL 350 MG/ML SOLN COMPARISON:  10/06/2015 FINDINGS: No filling defects in the pulmonary arteries to suggest pulmonary emboli. Mild cardiomegaly. Aorta is normal caliber. No mediastinal, hilar, or axillary adenopathy. Chest wall soft tissues are unremarkable. Imaging into the upper abdomen shows no acute findings. Mild ground-glass opacities scattered in the lungs, best seen in the mid and lower lung zones. This could reflect early interstitial edema. No effusions. No acute bony abnormality or focal bone lesion. Review of the MIP images confirms the above findings. IMPRESSION: No evidence of pulmonary embolus. Cardiomegaly. Scattered hazy ground-glass opacities in the lungs could reflect early interstitial edema. Electronically Signed   By: Rolm Baptise M.D.   On: 10/27/2015 15:23   I have personally reviewed and evaluated these images and lab results as part of my medical decision-making.   EKG Interpretation   Date/Time:  Tuesday October 27 2015 11:09:45 EST Ventricular Rate:  86 PR Interval:  229 QRS Duration:  101 QT Interval:  416 QTC Calculation: 498 R Axis:   -147 Text Interpretation:  Sinus rhythm Prolonged PR interval Right axis  deviation Low voltage, extremity and precordial leads Consider anterior  infarct Baseline wander in lead(s) II III aVF V6 No significant change  since last tracing Confirmed by [facility] MD, ERIN ([id number]) on 10/27/2015  1:35:41 PM      MDM   Final diagnoses:  DOE (dyspnea on exertion)  Acute nonintractable headache, unspecified headache type    Filed Vitals:   10/27/15 1330 10/27/15 1400  10/27/15 1415 10/27/15 1500  BP: 126/88 123/82 148/93 144/111  Pulse: 87 88 85 77  Temp:      TempSrc:      Resp: 17 23 22 19   SpO2: 100% 98% 96% 96%    NASHANTI FLIKKEMA is 60 y.o. female presenting with isolated shortness of breath. Patient has chest pain but this is typical for her chronic chest pain secondary to pulmonary embolism. States that she has been compliant with her Xarelto. Physical exam without calf tenderness however she did have some bruising to the right anterior medial aspect of the calf. EKG unchanged, she has a history of CHF with a proBNP normal at 26. Physical exam is not consistent with volume overload. Chest x-ray clear. Troponin negative.   CT negative for PE. Patient continues to have persistent headache, neuro exam nonfocal, will give Compazine and Solu-Medrol.   Has Low  risk by heart score. She has hypertension, hyperlipidemia, family history of ACS. Will obtain delta troponin, cardiology as consult at the patient's request they will come to evaluate her.   Case signed out to PA Dowless at shift change: Plan to follow-up cardiology consult and delta troponin  Monico Blitz, PA-C 10/27/15 1631  Gareth Morgan, MD 10/29/15 1225

## 2015-10-27 NOTE — Progress Notes (Signed)
CODE BLUE status clarified. Patient wants everything except intubation.  Ryaan Vanwagoner, PAC

## 2015-10-27 NOTE — Progress Notes (Signed)
BP 136/93 mmHg  Pulse 101  Temp(Src) 97.1 F (36.2 C)  Ht 5\' 4"  (1.626 m)  Wt 223 lb (101.152 kg)  BMI 38.26 kg/m2  SpO2 98%   Subjective:    Patient ID: Patricia Fischer, female    DOB: 1956-08-09, 60 y.o.   MRN: [id number]  HPI: Patricia Fischer is a 60 y.o. female  Chief Complaint  Patient presents with  . Follow-up  . Shortness of Breath    Started when patient started Carvedilol   . Blood Clots    Patient said she was told to rub her blood clots like she's putting on lotion and one of blood clots she thinks bursted. Leg turned black and swelled   She has has put on six pounds since her last visit here She is not eating any differently Her breathing is bad; on Saturday, she got so short of breath all of a sudden; can't breathe when she lays down; sleeping propped up on pillows She had a spot on the right shin and had bruising over the right leg for about a week; that broke Tuesday or Wednesday Short of breath just talking; taking a shower makes her short of breath; this is getting worse since leaving the hospital; she feels really short of breath, she is lucky if she can go from couch just across the room; just talking makes her short of breath; just started Saturday She was just hospitalized, they did not call the heart doctor in; she doesn't see Dr. Tamala Julian until March 22nd; they did an echo Tremendous headaches She does not start back losartan until next week Dr. Tamala Julian put her back on carvedilol, 3.125 mg BID on Friday and became short of breath on Saturday She says the worst thing is not being able to breathe  Relevant past medical, surgical, family and social history reviewed and updated as indicated Past Medical History  Diagnosis Date  . Essential hypertension   . Asthma   . Environmental allergies   . Pneumonia 2012  . Blood transfusion [place]  . GERD (gastroesophageal reflux disease)   . Depression   . Breast cancer, left breast ([place]) 2011     DCIS  . Head injury 2011  . Anxiety   . Hemorrhoids   . Obesity   . Chronic migraine without aura, with intractable migraine, so stated, with status migrainosus March 2016  . Chronic systolic CHF (congestive heart failure) ([place]) 05/05/2015    a. Dx 04/2015 - EF initially 25-30% with RV dysfunction, f/u echo 08/2015 technically difficult, EF 35-40%, anterior, anteroseptal, apical and inferoapical severe hypokinesis suggestive of LAD territory ischemia/infarct, grade 1 DD, mild MR, RV normal.  . Syncope     a. Recurrent syncope in 2016 felt 2/2 orthostasis in setting of PE.  . Orthostatic hypotension   . Saddle pulmonary embolus ([place]) Aug. 2016    a. Dx 04/2015 - patient returned 05/2015 after noncompliance with Xarelto.  Marland Kitchen DVT (deep venous thrombosis) ([place]) Aug. 2016    a. Dx 04/2015 - patient returned 05/2015 after noncompliance with Xarelto.  Marland Kitchen LBBB (left bundle branch block)   . Family history of adverse reaction to anesthesia     " My Mother would get deathly sick "  . Shortness of breath dyspnea    Past Surgical History  Procedure Laterality Date  . Breast surgery Left 2011    Breast lumpectomy  . Cardiac catheterization  2010    [facility]; pt states  it was "clear"  . Tonsillectomy    . Appendectomy    . Cholecystectomy    . Tubal ligation    . Anal fissure repair    . Knee cartilage surgery Left   . Carpal tunnel release Bilateral   . Tarsal tunnel release Left 1990  . Anterior cervical decomp/discectomy fusion  01/23/2012    Procedure: ANTERIOR CERVICAL DECOMPRESSION/DISCECTOMY FUSION 1 LEVEL;  Surgeon: Elaina Hoops, MD;  Location: [place] NEUR[facility]S;  Service: Neurosurgery;  Laterality: N/A;  Cervical five-six anterior cervical discectomy fusion with plating  . Cesarean section  1982, 1979  . Abdominal hysterectomy  1986  . Colonoscopy  01/16/2009    Dr Bary Castilla  . Shoulder arthroscopy with subacromial decompression, rotator cuff repair and bicep tendon repair Right 11/22/2013     Procedure: RIGHT SHOULDER ATHROSCOPY OPEN SUBSCAP REPAIR DELTA-PECTORAL ;  Surgeon: Augustin Schooling, MD;  Location: [place];  Service: Orthopedics;  Laterality: Right;  . Cardiac catheterization N/A 10/28/2015    Procedure: Right/Left Heart Cath and Coronary Angiography;  Surgeon: Belva Crome, MD;  Location: [place] CV LAB;  Service: Cardiovascular;  Laterality: N/A;   Interim medical history since our last visit reviewed. Allergies and medications reviewed and updated.  Review of Systems Per HPI unless specifically indicated above     Objective:    BP 136/93 mmHg  Pulse 101  Temp(Src) 97.1 F (36.2 C)  Ht 5\' 4"  (1.626 m)  Wt 223 lb (101.152 kg)  BMI 38.26 kg/m2  SpO2 98%  Wt Readings from Last 3 Encounters:  10/31/15 226 lb 3.2 oz (102.604 kg)  10/27/15 223 lb (101.152 kg)  10/06/15 217 lb (98.431 kg)    Physical Exam  Constitutional: She appears well-developed and well-nourished. She appears distressed.  Cardiovascular: Regular rhythm.  Tachycardia present.   Pulmonary/Chest: No accessory muscle usage. Tachypnea noted. No respiratory distress.  Dyspneic with short sentences  Skin: Bruising (over the right shin) noted. She is not diaphoretic. No pallor.  Psychiatric: Her mood appears anxious. Cognition and memory are not impaired. She does not express inappropriate judgment. She exhibits a depressed mood.  Tearful, anxious   Results for orders placed or performed during the hospital encounter of [id number]  Basic metabolic panel  Result Value Ref Range   Sodium 139 135 - 145 mmol/L   Potassium 3.5 3.5 - 5.1 mmol/L   Chloride 101 101 - 111 mmol/L   CO2 31 22 - 32 mmol/L   Glucose, Bld 107 (H) 65 - 99 mg/dL   BUN 13 6 - 20 mg/dL   Creatinine, Ser 0.82 0.44 - 1.00 mg/dL   Calcium 9.2 8.9 - 10.3 mg/dL   GFR calc non Af Amer >60 >60 mL/min   GFR calc Af Amer >60 >60 mL/min   Anion gap 7 5 - 15  CBC  Result Value Ref Range   WBC 7.1 3.6 - 11.0 K/uL   RBC 4.57 3.80  - 5.20 MIL/uL   Hemoglobin 12.6 12.0 - 16.0 g/dL   HCT 37.9 35.0 - 47.0 %   MCV 82.8 80.0 - 100.0 fL   MCH 27.5 26.0 - 34.0 pg   MCHC 33.2 32.0 - 36.0 g/dL   RDW 13.6 11.5 - 14.5 %   Platelets 183 150 - 440 K/uL  Troponin I  Result Value Ref Range   Troponin I <0.03 <0.031 ng/mL  Fibrin derivatives D-Dimer ([facility] only)  Result Value Ref Range   Fibrin derivatives D-dimer ([facility]) 537 (H)  0 - 499  Urinalysis complete, with microscopic ([facility] only)  Result Value Ref Range   Color, Urine YELLOW (A) YELLOW   APPearance CLEAR (A) CLEAR   Glucose, UA NEGATIVE NEGATIVE mg/dL   Bilirubin Urine NEGATIVE NEGATIVE   Ketones, ur NEGATIVE NEGATIVE mg/dL   Specific Gravity, Urine 1.026 1.005 - 1.030   Hgb urine dipstick NEGATIVE NEGATIVE   pH 6.0 5.0 - 8.0   Protein, ur NEGATIVE NEGATIVE mg/dL   Nitrite NEGATIVE NEGATIVE   Leukocytes, UA NEGATIVE NEGATIVE   RBC / HPF 0-5 0 - 5 RBC/hpf   WBC, UA 0-5 0 - 5 WBC/hpf   Bacteria, UA NONE SEEN NONE SEEN   Squamous Epithelial / LPF 0-5 (A) NONE SEEN   Mucous PRESENT   Basic metabolic panel  Result Value Ref Range   Sodium 138 135 - 145 mmol/L   Potassium 3.2 (L) 3.5 - 5.1 mmol/L   Chloride 105 101 - 111 mmol/L   CO2 30 22 - 32 mmol/L   Glucose, Bld 107 (H) 65 - 99 mg/dL   BUN 13 6 - 20 mg/dL   Creatinine, Ser 0.72 0.44 - 1.00 mg/dL   Calcium 8.2 (L) 8.9 - 10.3 mg/dL   GFR calc non Af Amer >60 >60 mL/min   GFR calc Af Amer >60 >60 mL/min   Anion gap 3 (L) 5 - 15  CBC  Result Value Ref Range   WBC 6.6 3.6 - 11.0 K/uL   RBC 4.05 3.80 - 5.20 MIL/uL   Hemoglobin 11.3 (L) 12.0 - 16.0 g/dL   HCT 33.6 (L) 35.0 - 47.0 %   MCV 82.9 80.0 - 100.0 fL   MCH 27.8 26.0 - 34.0 pg   MCHC 33.5 32.0 - 36.0 g/dL   RDW 13.9 11.5 - 14.5 %   Platelets 139 (L) 150 - 440 K/uL      Assessment & Plan:   Problem List Items Addressed This Visit      Cardiovascular and Mediastinum   Chronic systolic CHF (congestive heart failure) (HCC)    Acute worsening of  CHF; discussed with cardiologist; called EMS, to ER      Relevant Medications   carvedilol (COREG) 6.25 MG tablet   losartan (COZAAR) 50 MG tablet   Saddle pulmonary embolus (HCC)    Recent saddle PE with significant cardiac dysfunction; on anticoagulant, but with acute onset of worsening SHOB on Saturday; to ER via EMS      Relevant Medications   carvedilol (COREG) 6.25 MG tablet   losartan (COZAAR) 50 MG tablet     Other   Shortness of breath - Primary    I spoke with her cardiologist who agreed with having her go to the emergency department; with her weight loss, as well as recent PE and then rapid onset of worsening SHOB on Saturday, she have new clot, CHF, etc.; EMS was called and they transported patient to the ER         Follow up plan: No Follow-up on file. --> depending on ER/hospitalization, per f/u instructions  Face-to-face time with patient was more than 25 minutes, >50% time spent counseling and coordination of care

## 2015-10-27 NOTE — H&P (Signed)
Cardiology H&P Note    Patient ID: Patricia Fischer, MRN: [id number], DOB/AGE: 1956-08-13 60 y.o. Admit date: 10/27/2015   Date of Consult: 10/27/2015 Primary Physician: [name], MD Primary Cardiologist: Linard Millers  Chief Complaint: SOB Reason for Consultation: SOB Requesting MD: Ms. Karen Kays, PA-C  HPI:Patricia Fischer is a 60 y/o F with history of HTN with orthostatic hypotension (with hx of recurrent syncope due to orthostasis), chronic systolic CHF, saddle PE/DVT 04/2015, depression/anxiety, asthma, GERD, breast CA 2011,  LBBB, obesity who presented to [facility] with SOB.  Per review of history, she was admitted 8/14-8/24/16 with saddle PE and LLE DVTs. 2D Echo showed EF 25-30%, diffuse HK, PASP 68mmHg, moderately to severely dilated RV with severely decreased RV EF. The recommendation was for anticoagulation and consider L+R HC after 6 months of anticoagulation. She returned 05/04/15 with multiple episodes of recurrent syncope and markedly positive orthostatics. She had not been taking Xarelto. There were conflicting stories as to why (she said she never knew about the prescription in her paperwork versus when she went to the pharmacy to fill it, they told her they didn't have it in stock and finally called her several days later to tell her they did). F/u echo 05/05/15 EF 35-40%, grade 1 DD, normal RV function but limited study. Compliance with anticoagulation was encouraged and HF medications were limited due to orthostasis. F/u echo 08/25/15 was technically difficult, EF 35-40%, anterior, anteroseptal, apical and inferoapical severe hypokinesis suggestive of LAD territory ischemia/infarct, grade 1 DD, mild MR, RV normal. Losartan was added at that time but later stopped along with carvedilol as she was admitted 1/31-10/07/15 to [facility] with orthostatic hypotension. She called the office after discharge and per phone note 10/08/15, was advised to start Coreg 3.125mg  BID back, and resume Losartan at lower  dose of 25mg  daily in 2 weeks (she has not started the latter yet).  She presented to her PCP's office today for routine OV. She mentioned that since Saturday 10/24/15, she had increased SOB with talking as well as walking around. She reports chronic daily chest pain 24/7 since her PE in August, unchanged recently and without any change with exertion, inspiration or palpation. She appears to become more anxious when talking about her symptoms. She has had a 6lb weight gain in 3 weeks but is unsure why. She reports she sticks to a low salt diet. She estimates that she drinks 1/2 gallon of water per day (64 oz). She denies any LEE, orthopnea, bleeding, fever, cough, vomiting. She has no h/o tobacco abuse. She does not take a daily diuretic. Labs notable for normal troponin, BNP, CMET, and CBC. She received benadryl/solumedrol in prep for her CT scan, along with compazine, Toradol, Tylenol, and was just written for 40mg  IV Lasix by ER. CTA demonstrated no evidence of PE; + cardiomegaly with scattered hazy ground-glass opacities in the lungs could reflect early interstitial edema. She has just now started to urinate due to her Lasix.   Past Medical History  Diagnosis Date  . Essential hypertension   . Asthma   . Environmental allergies   . Pneumonia 2012  . Blood transfusion [place]  . GERD (gastroesophageal reflux disease)   . Depression   . Breast cancer, left breast ([place]) 2011    DCIS  . Head injury 2011  . Anxiety   . Hemorrhoids   . Obesity   . Chronic migraine without aura, with intractable migraine, so stated, with status migrainosus March  2016  . Chronic systolic CHF (congestive heart failure) ([place]) 05/05/2015    a. Dx 04/2015 - EF initially 25-30% with RV dysfunction, f/u echo 08/2015 technically difficult, EF 35-40%, anterior, anteroseptal, apical and inferoapical severe hypokinesis suggestive of LAD territory ischemia/infarct, grade 1 DD, mild MR, RV normal.  . Syncope     a.  Recurrent syncope in 2016 felt 2/2 orthostasis in setting of PE.  . Orthostatic hypotension   . Saddle pulmonary embolus ([place]) Aug. 2016    a. Dx 04/2015 - patient returned 05/2015 after noncompliance with Xarelto.  Marland Kitchen DVT (deep venous thrombosis) ([place]) Aug. 2016    a. Dx 04/2015 - patient returned 05/2015 after noncompliance with Xarelto.  Marland Kitchen LBBB (left bundle branch block)       Surgical History:  Past Surgical History  Procedure Laterality Date  . Breast surgery Left 2011    Breast lumpectomy  . Cardiac catheterization  2010    [facility]; pt states it was "clear"  . Tonsillectomy    . Appendectomy    . Cholecystectomy    . Tubal ligation    . Anal fissure repair    . Knee cartilage surgery Left   . Carpal tunnel release Bilateral   . Tarsal tunnel release Left 1990  . Anterior cervical decomp/discectomy fusion  01/23/2012    Procedure: ANTERIOR CERVICAL DECOMPRESSION/DISCECTOMY FUSION 1 LEVEL;  Surgeon: Elaina Hoops, MD;  Location: [place] [facility];  Service: Neurosurgery;  Laterality: N/A;  Cervical five-six anterior cervical discectomy fusion with plating  . Cesarean section  1982, 1979  . Abdominal hysterectomy  1986  . Colonoscopy  01/16/2009    Dr Bary Castilla  . Shoulder arthroscopy with subacromial decompression, rotator cuff repair and bicep tendon repair Right 11/22/2013    Procedure: RIGHT SHOULDER ATHROSCOPY OPEN SUBSCAP REPAIR DELTA-PECTORAL ;  Surgeon: Augustin Schooling, MD;  Location: [place];  Service: Orthopedics;  Laterality: Right;     Home Meds: Prior to Admission medications   Medication Sig Start Date End Date Taking? Authorizing Provider  albuterol (PROVENTIL HFA;VENTOLIN HFA) 108 (90 BASE) MCG/ACT inhaler Inhale 2 puffs into the lungs every 6 (six) hours as needed for shortness of breath.    Yes Historical Provider, MD  amitriptyline (ELAVIL) 25 MG tablet Take 3 tablets (75 mg total) by mouth at bedtime. 09/06/15  Yes Kathrynn Ducking, MD  aspirin EC 81 MG tablet Take  324 mg by mouth once.   Yes Historical Provider, MD  atorvastatin (LIPITOR) 20 MG tablet Take 1 tablet (20 mg total) by mouth daily at 6 PM. 08/27/15  Yes Belva Crome, MD  Blood Pressure Monitoring (BLOOD PRESSURE CUFF) MISC 1 Device by Does not apply route once. 05/20/15  Yes Rhonda G Barrett, PA-C  carvedilol (COREG) 6.25 MG tablet Take 0.5 tablets by mouth 2 (two) times daily. Take 1/2 of tablet twice daily 09/02/15  Yes Historical Provider, MD  fluticasone (FLONASE) 50 MCG/ACT nasal spray Place 1 spray into both nostrils daily as needed for allergies or rhinitis.  07/04/14  Yes Historical Provider, MD  HYDROcodone-acetaminophen (NORCO/VICODIN) 5-325 MG tablet Take 1 tablet by mouth every 6 (six) hours as needed for moderate pain. 10/06/15  Yes Arnetha Courser, MD  losartan (COZAAR) 50 MG tablet Take 25 mg by mouth daily. Take 1/2 tablet by mouth daily 09/04/15  Yes Historical Provider, MD  nitroGLYCERIN (NITROSTAT) 0.4 MG SL tablet Place 0.4 mg under the tongue every 5 (five) minutes as needed for chest  pain.   Yes Historical Provider, MD  ondansetron (ZOFRAN ODT) 4 MG disintegrating tablet Take 1 tablet (4 mg total) by mouth every 8 (eight) hours as needed for nausea or vomiting. 03/18/15  Yes Kathrynn Ducking, MD  rivaroxaban (XARELTO) 20 MG TABS tablet Take 1 tablet (20 mg total) by mouth daily with supper. 06/10/15  Yes Arnetha Courser, MD  tiZANidine (ZANAFLEX) 2 MG tablet Take 1-2 tablets (2-4 mg total) by mouth at bedtime as needed for muscle spasms. 10/06/15  Yes Arnetha Courser, MD  butalbital-acetaminophen-caffeine (FIORICET) 50-325-40 MG tablet Take 1 tablet by mouth every 6 (six) hours as needed for headache. 10/27/15   Elmyra Ricks Pisciotta, PA-C  prochlorperazine (COMPAZINE) 10 MG tablet Take 1 tablet (10 mg total) by mouth 2 (two) times daily as needed (Headache). 10/27/15   Monico Blitz, PA-C    Inpatient Medications:     Allergies:  Allergies  Allergen Reactions  . Gadolinium  Derivatives Shortness Of Breath and Nausea And Vomiting  . Sulfa Antibiotics Swelling    "Ears swelled up like Dumbo"  . Aripiprazole     Dry mouth with sores   . Codeine Itching and Rash  . Oxycodone Itching  . Penicillins Hives    Has patient had a PCN reaction causing immediate rash, facial/tongue/throat swelling, SOB or lightheadedness with hypotension: Yes Has patient had a PCN reaction causing severe rash involving mucus membranes or skin necrosis: No Has patient had a PCN reaction that required hospitalization No Has patient had a PCN reaction occurring within the last 10 years: No If all of the above answers are "NO", then may proceed with Cephalosporin use.    Social History   Social History  . Marital Status: Divorced    Spouse Name: N/A  . Number of Children: 2  . Years of Education: college-1   Occupational History  . Unemployed    Social History Main Topics  . Smoking status: Never Smoker   . Smokeless tobacco: Never Used  . Alcohol Use: No     Comment: occasional wine  . Drug Use: No  . Sexual Activity: No   Other Topics Concern  . Not on file   Social History Narrative     Family History  Problem Relation Age of Onset  . Anesthesia problems Neg Hx   . Diabetes Neg Hx   . Breast cancer Paternal Aunt   . Breast cancer Mother   . Migraines Mother   . Cancer Mother   . Hypertension Mother   . Rectal cancer Father   . Cancer Father   . COPD Father   . Heart disease Paternal Grandmother   . Stroke Paternal Grandmother   . Heart attack Paternal Grandmother   . Aneurysm Paternal Grandfather   . COPD Paternal Grandfather      Review of Systems: All other systems reviewed and are otherwise negative except as noted above.  Labs:  Lab Results  Component Value Date   WBC 7.9 10/27/2015   HGB 13.0 10/27/2015   HCT 40.6 10/27/2015   MCV 86.4 10/27/2015   PLT 223 10/27/2015    Recent Labs Lab 10/27/15 1134  NA 142  K 3.9  CL 102  CO2 28    BUN 7  CREATININE 0.92  CALCIUM 9.4  PROT 7.4  BILITOT 0.5  ALKPHOS 98  ALT 15  AST 23  GLUCOSE 101*   Lab Results  Component Value Date   CHOL 171 09/18/2015   HDL 51  09/18/2015   Stryker 95 09/18/2015   TRIG 123 09/18/2015   Lab Results  Component Value Date   DDIMER 9.54* 04/19/2015    Radiology/Studies:  Dg Chest 2 View  10/27/2015  CLINICAL DATA:  Shortness of breath.  Chest pain on the left EXAM: CHEST  2 VIEW COMPARISON:  10/06/2015 FINDINGS: Normal heart size and stable aortic tortuosity.  Negative hila. Chronic elevation of the right diaphragm. There is no edema, consolidation, effusion, or pneumothorax. Cholecystectomy clips. No acute osseous finding. IMPRESSION: Stable.  No acute finding. Electronically Signed   By: Monte Fantasia M.D.   On: 10/27/2015 12:07   Dg Chest 2 View  10/06/2015  CLINICAL DATA:  Left side neck pain, shoulder pain for 2 weeks EXAM: CHEST  2 VIEW COMPARISON:  06/10/2015 FINDINGS: Cardiomediastinal silhouette is stable. There is chronic elevation of the right hemidiaphragm. No acute infiltrate or pleural effusion. No pulmonary edema. Mild degenerative changes mid and lower thoracic spine. IMPRESSION: No active cardiopulmonary disease. Mild degenerative changes thoracic spine. Electronically Signed   By: Lahoma Crocker M.D.   On: 10/06/2015 13:53   Ct Angio Chest Pe W/cm &/or Wo Cm  10/27/2015  CLINICAL DATA:  SOB x 3 days. Hx pulmonary emboli, asthma, HTN, breast cancer, pneumonia, and CHF. EXAM: CT ANGIOGRAPHY CHEST WITH CONTRAST TECHNIQUE: Multidetector CT imaging of the chest was performed using the standard protocol during bolus administration of intravenous contrast. Multiplanar CT image reconstructions and MIPs were obtained to evaluate the vascular anatomy. CONTRAST:  151mL OMNIPAQUE IOHEXOL 350 MG/ML SOLN COMPARISON:  10/06/2015 FINDINGS: No filling defects in the pulmonary arteries to suggest pulmonary emboli. Mild cardiomegaly. Aorta is normal  caliber. No mediastinal, hilar, or axillary adenopathy. Chest wall soft tissues are unremarkable. Imaging into the upper abdomen shows no acute findings. Mild ground-glass opacities scattered in the lungs, best seen in the mid and lower lung zones. This could reflect early interstitial edema. No effusions. No acute bony abnormality or focal bone lesion. Review of the MIP images confirms the above findings. IMPRESSION: No evidence of pulmonary embolus. Cardiomegaly. Scattered hazy ground-glass opacities in the lungs could reflect early interstitial edema. Electronically Signed   By: Rolm Baptise M.D.   On: 10/27/2015 15:23   Ct Angio Chest Pe W/cm &/or Wo Cm  10/06/2015  CLINICAL DATA:  Shortness of breath. Left-sided pain from neck to shoulder blades. EXAM: CT ANGIOGRAPHY CHEST WITH CONTRAST TECHNIQUE: Multidetector CT imaging of the chest was performed using the standard protocol during bolus administration of intravenous contrast. Multiplanar CT image reconstructions and MIPs were obtained to evaluate the vascular anatomy. CONTRAST:  100 cc of Isovue 370 COMPARISON:  06/10/2015. FINDINGS: Mediastinum: The heart size appears normal. There is no pericardial effusion. The trachea appears patent and is midline. Unremarkable appearance of the esophagus. No enlarged mediastinal or hilar lymph nodes identified. Mild aortic atherosclerosis. No axillary or supraclavicular adenopathy. The main pulmonary artery is patent. No right or left main pulmonary artery embolus identified. There is no lobar or segmental pulmonary artery filling defects. Lungs/Pleura: No pleural fluid identified. No airspace consolidation or atelectasis. Upper Abdomen: Mild diffuse hepatic steatosis noted previous cholecystectomy. The visualized portions of the spleen, adrenal glands pancreas and kidneys appear normal Musculoskeletal: Degenerative disc disease noted within the lower thoracic spine Review of the MIP images confirms the above  findings. IMPRESSION: 1. No acute pulmonary embolus 2. No active cardiopulmonary abnormalities noted. Electronically Signed   By: Kerby Moors M.D.   On: 10/06/2015 18:53  US Carotid Bilateral  10/07/2015  CLINICAL DATA:  Syncopal episode. History of hypertension and hyperlipidemia. EXAM: BILATERAL CAROTID DUPLEX ULTRASOUND TECHNIQUE: Pearline Cables scale imaging, color Doppler and duplex ultrasound were performed of bilateral carotid and vertebral arteries in the neck. COMPARISON:  Head CT - 04/20/2015 FINDINGS: Criteria: Quantification of carotid stenosis is based on velocity parameters that correlate the residual internal carotid diameter with NASCET-based stenosis levels, using the diameter of the distal internal carotid lumen as the denominator for stenosis measurement. The following velocity measurements were obtained: RIGHT ICA:  73/28 cm/sec CCA:  123XX123 cm/sec SYSTOLIC ICA/CCA RATIO:  0.8 DIASTOLIC ICA/CCA RATIO:  1.0 ECA:  86 cm/sec LEFT ICA:  91/38 cm/sec CCA:  123456 cm/sec SYSTOLIC ICA/CCA RATIO:  0.9 DIASTOLIC ICA/CCA RATIO:  1.3 ECA:  160 cm/sec RIGHT CAROTID ARTERY: There is no significant atherosclerotic plaque or intimal wall thickening affecting the interrogated portions of the right carotid system. There are no elevated peak systolic velocity within the interrogated course of the right internal carotid artery to suggest a hemodynamically significant stenosis. RIGHT VERTEBRAL ARTERY:  Antegrade flow LEFT CAROTID ARTERY: There is no grayscale evidence of significant intimal thickening or atherosclerotic plaque affecting the interrogated portions of the left carotid system. There are no elevated peak systolic velocities within the interrogated course of the left internal carotid artery to suggest a hemodynamically significant stenosis. LEFT VERTEBRAL ARTERY:  Antegrade flow Incidental note is made of a cardiac arrhythmia (representative images 31, 49, 59 and 64). IMPRESSION: Incidental note is made of a  cardiac arrhythmia. Further evaluation with EKG monitoring could be performed as clinically indicated. Otherwise, unremarkable carotid Doppler ultrasound. Electronically Signed   By: Sandi Mariscal M.D.   On: 10/07/2015 14:33    Wt Readings from Last 3 Encounters:  10/27/15 223 lb (101.152 kg)  10/06/15 217 lb (98.431 kg)  10/06/15 217 lb (98.431 kg)   EKG: NSR 86bpm, 1st degree AVB, consider prior anterior infarct, right axis deviation, generally low voltage  Physical Exam: Blood pressure 144/111, pulse 77, temperature 98.5 F (36.9 C), temperature source Oral, resp. rate 19, SpO2 96 %. There is no weight on file to calculate BMI. General: Well developed, well nourished, in no acute distress. Head: Normocephalic, atraumatic, sclera non-icteric, no xanthomas, nares are without discharge.  Neck: Negative for carotid bruits. JVD not elevated. Lungs: Clear bilaterally to auscultation without wheezes, rales, or rhonchi. Breathing is unlabored. Heart: RRR with S1 S2. No murmurs, rubs, or gallops appreciated. Abdomen: Soft, non-tender, non-distended with normoactive bowel sounds. No hepatomegaly. No rebound/guarding. No obvious abdominal masses. Msk:  Strength and tone appear normal for age. Extremities: No clubbing or cyanosis. No edema.  Distal pedal pulses are 2+ and equal bilaterally. Neuro: Alert and oriented X 3. No facial asymmetry. No focal deficit. Moves all extremities spontaneously. Psych:  Responds to questions appropriately with a normal affect.     Assessment and Plan  68F with HTN with orthostatic hypotension (causing syncope, requiring frequent med adjustment), chronic systolic CHF, saddle PE/DVT 04/2015, depression, asthma, GERD, breast CA 2011, LBBB, obesity who presented to [facility] with SOB with talking and exertion. CTA neg for recurrent PE; question early interstitial edema but BNP normal. Labs unremarkable. 2D echo 08/25/15 was technically difficult, EF 35-40%, anterior,  anteroseptal, apical and inferoapical severe hypokinesis suggestive of LAD territory ischemia/infarct, grade 1 DD, mild MR, RV normal.  1. Shortness of breath (new) superimposed on chronic daily chest pain 2. Chronic systolic CHF 3. History of saddle PE & DVT  in 04/2015 4. Essential HTN with history of marked orthostatic hypotension c/b syncope  Complex patient. It sounds like reading through notes she has had DOE in the past, but reports worsening over the last 3 days with talking and exertion. 2D echo in December showed persistent LV dysfunction notable for WMA in the LAD territory. Her workup in the ER today is notable for CTA neg for PE and totally normal BNP. Despite reports of weight gain she does not really appear volume overloaded on exam. It is right around the 6 months previously recommended to anticogulate before proceeding with R+LHC. It may be worthwhile to answer this question as inpatient since there is no other obvious explanation for her dyspnea. It could be due to anxiety but her cardiomyopathy remains undefined. She will also require bridging with heparin given her PE. Per d/w MD, plan R+LHC tomorrow - risks, benefits, alternatives discussed with the patient by Dr. Angelena Form. See below for additional thoughts. Will continue BB but hold losartan (she had not yet started this).  Signed, Charlie Pitter PA-C 10/27/2015, 4:34 PM Pager: [phone]  I have personally seen and examined this patient with Melina Copa, PA-C. I agree with the assessment and plan as outlined above. Presenting with dyspnea. She has known cardiomyopathy. No prior cath. She has prior PE but CTA today without PE. She is not volume overloaded on exam. BNP is normal. I think she needs a right and left heart cath. Will arrange tomorrow. NPO at [place]. Stop Xarelto. IV heparin will be started tonight.   MCALHANY,CHRISTOPHER 10/27/2015 5:40 PM

## 2015-10-28 ENCOUNTER — Encounter (HOSPITAL_COMMUNITY)
Admission: EM | Disposition: A | Discharge status: Home / Self Care | Payer: Self-pay | Source: Home / Self Care | Attending: Emergency Medicine

## 2015-10-28 DIAGNOSIS — I2510 Atherosclerotic heart disease of native coronary artery without angina pectoris: Secondary | ICD-10-CM

## 2015-10-28 DIAGNOSIS — I5022 Chronic systolic (congestive) heart failure: Secondary | ICD-10-CM

## 2015-10-28 HISTORY — PX: CARDIAC CATHETERIZATION: SHX172

## 2015-10-28 LAB — POCT I-STAT 3, ART BLOOD GAS (G3+)
Acid-base deficit: 3 mmol/L — ABNORMAL HIGH (ref 0.0–2.0)
BICARBONATE: 23.1 meq/L (ref 20.0–24.0)
O2 Saturation: 95 %
PCO2 ART: 43.1 mmHg (ref 35.0–45.0)
PH ART: 7.337 — AB (ref 7.350–7.450)
PO2 ART: 83 mmHg (ref 80.0–100.0)
TCO2: 24 mmol/L (ref 0–100)

## 2015-10-28 LAB — POCT I-STAT 3, VENOUS BLOOD GAS (G3P V)
ACID-BASE DEFICIT: 2 mmol/L (ref 0.0–2.0)
Bicarbonate: 24.8 mEq/L — ABNORMAL HIGH (ref 20.0–24.0)
O2 Saturation: 63 %
PCO2 VEN: 51.2 mmHg — AB (ref 45.0–50.0)
PH VEN: 7.292 (ref 7.250–7.300)
PO2 VEN: 37 mmHg (ref 30.0–45.0)
TCO2: 26 mmol/L (ref 0–100)

## 2015-10-28 LAB — BASIC METABOLIC PANEL
ANION GAP: 15 (ref 5–15)
BUN: 14 mg/dL (ref 6–20)
CALCIUM: 9.4 mg/dL (ref 8.9–10.3)
CO2: 22 mmol/L (ref 22–32)
CREATININE: 1.15 mg/dL — AB (ref 0.44–1.00)
Chloride: 98 mmol/L — ABNORMAL LOW (ref 101–111)
GFR calc Af Amer: 58 mL/min — ABNORMAL LOW (ref >60)
GFR, EST NON AFRICAN AMERICAN: 50 mL/min — AB (ref >60)
GLUCOSE: 171 mg/dL — AB (ref 65–99)
Potassium: 4.3 mmol/L (ref 3.5–5.1)
Sodium: 135 mmol/L (ref 135–145)

## 2015-10-28 LAB — HEPARIN LEVEL (UNFRACTIONATED): Heparin Unfractionated: 0.44 IU/mL (ref 0.30–0.70)

## 2015-10-28 LAB — TROPONIN I

## 2015-10-28 LAB — APTT: APTT: 63 s — AB (ref 24–37)

## 2015-10-28 LAB — POCT ACTIVATED CLOTTING TIME: ACTIVATED CLOTTING TIME: 204 s

## 2015-10-28 SURGERY — RIGHT/LEFT HEART CATH AND CORONARY ANGIOGRAPHY
Anesthesia: LOCAL

## 2015-10-28 MED ORDER — HEPARIN SODIUM (PORCINE) 1000 UNIT/ML IJ SOLN
INTRAMUSCULAR | Status: AC
  Filled 2015-10-28: qty 1

## 2015-10-28 MED ORDER — SODIUM CHLORIDE 0.9 % WEIGHT BASED INFUSION: 1.0000 mL/kg/h | INTRAVENOUS | Status: AC

## 2015-10-28 MED ORDER — SODIUM CHLORIDE 0.9% FLUSH: 3.0000 mL | INTRAVENOUS | Status: DC | PRN

## 2015-10-28 MED ORDER — LIDOCAINE HCL (PF) 1 % IJ SOLN: INTRAMUSCULAR | Status: DC | PRN

## 2015-10-28 MED ORDER — HEPARIN SODIUM (PORCINE) 1000 UNIT/ML IJ SOLN
INTRAMUSCULAR | Status: DC | PRN
  Administered 2015-10-28: 5000 [IU] via INTRAVENOUS

## 2015-10-28 MED ORDER — HEPARIN (PORCINE) IN NACL 2-0.9 UNIT/ML-% IJ SOLN
INTRAMUSCULAR | Status: AC
  Filled 2015-10-28: qty 1000

## 2015-10-28 MED ORDER — CARVEDILOL 3.125 MG PO TABS
3.1250 mg | ORAL_TABLET | Freq: Two times a day (BID) | ORAL | Status: DC
  Administered 2015-10-28 – 2015-10-31 (×7): 3.125 mg via ORAL
  Filled 2015-10-28 (×7): qty 1

## 2015-10-28 MED ORDER — VERAPAMIL HCL 2.5 MG/ML IV SOLN
INTRAVENOUS | Status: AC
  Filled 2015-10-28: qty 2

## 2015-10-28 MED ORDER — FENTANYL CITRATE (PF) 100 MCG/2ML IJ SOLN
INTRAMUSCULAR | Status: AC
  Filled 2015-10-28: qty 2

## 2015-10-28 MED ORDER — MIDAZOLAM HCL 2 MG/2ML IJ SOLN
INTRAMUSCULAR | Status: AC
  Filled 2015-10-28: qty 2

## 2015-10-28 MED ORDER — VERAPAMIL HCL 2.5 MG/ML IV SOLN
INTRAVENOUS | Status: DC | PRN
  Administered 2015-10-28: 10 mL via INTRA_ARTERIAL

## 2015-10-28 MED ORDER — RIVAROXABAN 20 MG PO TABS
20.0000 mg | ORAL_TABLET | Freq: Once | ORAL | Status: AC
  Administered 2015-10-29: 20 mg via ORAL
  Filled 2015-10-28: qty 1

## 2015-10-28 MED ORDER — ONDANSETRON HCL 4 MG/2ML IJ SOLN: 4.0000 mg | Freq: Four times a day (QID) | INTRAMUSCULAR | Status: DC | PRN

## 2015-10-28 MED ORDER — SACUBITRIL-VALSARTAN 24-26 MG PO TABS
1.0000 | ORAL_TABLET | Freq: Two times a day (BID) | ORAL | Status: DC
  Administered 2015-10-28 – 2015-10-29 (×2): 1 via ORAL
  Filled 2015-10-28 (×2): qty 1

## 2015-10-28 MED ORDER — SODIUM CHLORIDE 0.9 % IV SOLN: 250.0000 mL | INTRAVENOUS | Status: DC | PRN

## 2015-10-28 MED ORDER — ACETAMINOPHEN 325 MG PO TABS
650.0000 mg | ORAL_TABLET | ORAL | Status: DC | PRN
  Administered 2015-10-29 – 2015-10-31 (×5): 650 mg via ORAL
  Filled 2015-10-28 (×5): qty 2

## 2015-10-28 MED ORDER — RIVAROXABAN 20 MG PO TABS
20.0000 mg | ORAL_TABLET | Freq: Every day | ORAL | Status: DC
  Administered 2015-10-29 – 2015-10-31 (×3): 20 mg via ORAL
  Filled 2015-10-28 (×3): qty 1

## 2015-10-28 MED ORDER — LIDOCAINE HCL (PF) 1 % IJ SOLN
INTRAMUSCULAR | Status: AC
  Filled 2015-10-28: qty 30

## 2015-10-28 MED ORDER — DIPHENHYDRAMINE HCL 50 MG/ML IJ SOLN
25.0000 mg | INTRAMUSCULAR | Status: AC
  Administered 2015-10-28: 25 mg via INTRAVENOUS
  Filled 2015-10-28: qty 1

## 2015-10-28 MED ORDER — LIDOCAINE HCL (PF) 1 % IJ SOLN
INTRAMUSCULAR | Status: DC | PRN
  Administered 2015-10-28: 18:00:00

## 2015-10-28 MED ORDER — MIDAZOLAM HCL 2 MG/2ML IJ SOLN
INTRAMUSCULAR | Status: DC | PRN
  Administered 2015-10-28 (×2): 1 mg via INTRAVENOUS

## 2015-10-28 MED ORDER — IOHEXOL 350 MG/ML SOLN
INTRAVENOUS | Status: DC | PRN
  Administered 2015-10-28: 90 mL via INTRA_ARTERIAL

## 2015-10-28 MED ORDER — FENTANYL CITRATE (PF) 100 MCG/2ML IJ SOLN
INTRAMUSCULAR | Status: DC | PRN
  Administered 2015-10-28 (×2): 50 ug via INTRAVENOUS

## 2015-10-28 MED ORDER — SODIUM CHLORIDE 0.9% FLUSH
3.0000 mL | Freq: Two times a day (BID) | INTRAVENOUS | Status: DC
  Administered 2015-10-30 – 2015-10-31 (×3): 3 mL via INTRAVENOUS

## 2015-10-28 SURGICAL SUPPLY — 13 items
CATH BALLN WEDGE 5F 110CM (CATHETERS) ×2 IMPLANT
CATH INFINITI 5 FR JL3.5 (CATHETERS) ×2 IMPLANT
CATH INFINITI 5FR ANG PIGTAIL (CATHETERS) ×2 IMPLANT
CATH INFINITI JR4 5F (CATHETERS) ×2 IMPLANT
DEVICE RAD COMP TR BAND LRG (VASCULAR PRODUCTS) ×2 IMPLANT
GLIDESHEATH SLEND A-KIT 6F 22G (SHEATH) ×2 IMPLANT
GUIDEWIRE .025 260CM (WIRE) ×2 IMPLANT
KIT HEART LEFT (KITS) ×2 IMPLANT
PACK CARDIAC CATHETERIZATION (CUSTOM PROCEDURE TRAY) ×2 IMPLANT
SHEATH FAST CATH BRACH 5F 5CM (SHEATH) ×2 IMPLANT
TRANSDUCER W/STOPCOCK (MISCELLANEOUS) ×2 IMPLANT
TUBING CIL FLEX 10 FLL-RA (TUBING) ×2 IMPLANT
WIRE SAFE-T 1.5MM-J .035X260CM (WIRE) ×2 IMPLANT

## 2015-10-28 NOTE — Progress Notes (Signed)
Post-cath orders received to discharge patient once bedrest completed, TR band deflated, and vital signs stable. Pt and family state they spoke with Cardiology this PM and the plan is to discharge tomorrow due to new meds for medical management. Will page MD and continue to monitor.

## 2015-10-28 NOTE — Progress Notes (Signed)
Labs and EKG within date. Pre-meds up to date. Will relay to day shift RN to send IV Pepcid on-call to cath lab. Pt with 2 IV sites, avoiding R wrist. Pt watching cath video now. Consent filled out by RN. Pt to sign consent in AM. Will continue to monitor.

## 2015-10-28 NOTE — Care Management Obs Status (Signed)
[place] NOTIFICATION   Patient Details  Name: MELADY ASKEW MRN: [id number] Date of Birth: February 15, 1956   Medicare Observation Status Notification Given:  Yes    Royston Bake, RN 10/28/2015, 2:47 PM

## 2015-10-28 NOTE — Progress Notes (Signed)
Site area: right brachial a 5 french venous sheath was removed  Site Prior to Removal:  Level 0  Pressure Applied For 15 MINUTES    Minutes Beginning at 1825p  Manual:   Yes.    Patient Status During Pull:  stable  Post Pull Groin Site:  Level 0  Post Pull Instructions Given:  Yes.    Post Pull Pulses Present:  Yes.    Dressing Applied:  Yes.    Comments:  VS remain stable during sheath pull.

## 2015-10-28 NOTE — Progress Notes (Signed)
ANTICOAGULATION CONSULT NOTE - Initial Consult  Pharmacy Consult for Xarelto Indication: h/o DVT/PE  Allergies  Allergen Reactions  . Gadolinium Derivatives Shortness Of Breath and Nausea And Vomiting  . Sulfa Antibiotics Swelling    "Ears swelled up like Dumbo"  . Aripiprazole     Dry mouth with sores   . Codeine Itching and Rash  . Oxycodone Itching  . Penicillins Hives    Has patient had a PCN reaction causing immediate rash, facial/tongue/throat swelling, SOB or lightheadedness with hypotension: Yes Has patient had a PCN reaction causing severe rash involving mucus membranes or skin necrosis: No Has patient had a PCN reaction that required hospitalization No Has patient had a PCN reaction occurring within the last 10 years: No If all of the above answers are "NO", then may proceed with Cephalosporin use.    Patient Measurements: Weight: 225 lb 1.4 oz (102.1 kg) (Scale A)  Vital Signs: Temp: 97.5 F (36.4 C) (02/22 1230) Temp Source: Oral (02/22 1230) BP: 166/44 mmHg (02/22 1850) Pulse Rate: 75 (02/22 1850)  Labs:  Recent Labs  10/27/15 1134 10/27/15 1936 10/28/15 0027 10/28/15 0352 10/28/15 0740  HGB 13.0  --   --   --   --   HCT 40.6  --   --   --   --   PLT 223  --   --   --   --   APTT  --   --   --  63*  --   LABPROT 14.3  --   --   --   --   INR 1.09  --   --   --   --   HEPARINUNFRC  --   --   --  0.44  --   CREATININE 0.92  --   --   --  1.15*  TROPONINI  --  <0.03 <0.03  --  <0.03    Estimated Creatinine Clearance: 59.8 mL/min (by C-G formula based on Cr of 1.15).   Medical History: Past Medical History  Diagnosis Date  . Essential hypertension   . Asthma   . Environmental allergies   . Pneumonia 2012  . Blood transfusion [place]  . GERD (gastroesophageal reflux disease)   . Depression   . Breast cancer, left breast ([place]) 2011    DCIS  . Head injury 2011  . Anxiety   . Hemorrhoids   . Obesity   . Chronic migraine without  aura, with intractable migraine, so stated, with status migrainosus March 2016  . Chronic systolic CHF (congestive heart failure) ([place]) 05/05/2015    a. Dx 04/2015 - EF initially 25-30% with RV dysfunction, f/u echo 08/2015 technically difficult, EF 35-40%, anterior, anteroseptal, apical and inferoapical severe hypokinesis suggestive of LAD territory ischemia/infarct, grade 1 DD, mild MR, RV normal.  . Syncope     a. Recurrent syncope in 2016 felt 2/2 orthostasis in setting of PE.  . Orthostatic hypotension   . Saddle pulmonary embolus ([place]) Aug. 2016    a. Dx 04/2015 - patient returned 05/2015 after noncompliance with Xarelto.  Marland Kitchen DVT (deep venous thrombosis) ([place]) Aug. 2016    a. Dx 04/2015 - patient returned 05/2015 after noncompliance with Xarelto.  Marland Kitchen LBBB (left bundle branch block)      Assessment: 60yo female presenting with SOB and chronic daily chest pain, and history of saddle PE + DVT in Aug 2016.  She was on Xarelto 20mg  daily at home, with her last dose  on 2/20.   Anticoagulation: d/c IV heparin post-cath (sheath out 1849) and resume  Xarelto in 8 hrs.   Goal of Therapy:  Therapeutic oral anticoagulation  Monitor platelets by anticoagulation protocol: Yes   Plan:  Xarelto 20mg  po x 1 tonight at 0250 (8 hrs post-sheath removal) Then tomorrow can resume Xarelto 20mg  daily with supper.  Eilene Ghazi Stillinger 10/28/2015,7:08 PM

## 2015-10-28 NOTE — Interval H&P Note (Signed)
Cath Lab Visit (complete for each Cath Lab visit)  Clinical Evaluation Leading to the Procedure:   ACS: No.  Non-ACS:    Anginal Classification: CCS III  Anti-ischemic medical therapy: Maximal Therapy (2 or more classes of medications)  Non-Invasive Test Results: Intermediate-risk stress test findings: cardiac mortality 1-3%/year  Prior CABG: No previous CABG      History and Physical Interval Note:  10/28/2015 4:51 PM  Patricia Fischer  has presented today for surgery, with the diagnosis of cardiomyopathy, sob  The various methods of treatment have been discussed with the patient and family. After consideration of risks, benefits and other options for treatment, the patient has consented to  Procedure(s): Right/Left Heart Cath and Coronary Angiography (N/A) as a surgical intervention .  The patient's history has been reviewed, patient examined, no change in status, stable for surgery.  I have reviewed the patient's chart and labs.  Questions were answered to the patient's satisfaction.     Sinclair Grooms

## 2015-10-28 NOTE — H&P (View-Only) (Signed)
Subjective:  No chest pain  Objective:  Vital Signs in the last 24 hours: Temp:  [97.4 F (36.3 C)-99.3 F (37.4 C)] 97.4 F (36.3 C) (02/22 0900) Pulse Rate:  [77-120] 95 (02/22 0900) Resp:  [14-25] 18 (02/22 0900) BP: (97-148)/(64-111) 117/64 mmHg (02/22 0900) SpO2:  [94 %-100 %] 94 % (02/22 0900) Weight:  [220 lb 14.4 oz (100.2 kg)-225 lb 1.4 oz (102.1 kg)] 225 lb 1.4 oz (102.1 kg) (02/22 0513)  Intake/Output from previous day:  Intake/Output Summary (Last 24 hours) at 10/28/15 1041 Last data filed at 10/28/15 1000  Gross per 24 hour  Intake   1040 ml  Output   1850 ml  Net   -810 ml    Physical Exam: General appearance: alert, cooperative, no distress and moderately obese Lungs: clear to auscultation bilaterally Heart: regular rate and rhythm Extremities: 3+ Rt radial pulse, no LE edema   Rate: 100  Rhythm: normal sinus rhythm and sinus tachycardia  Lab Results:  Recent Labs  10/27/15 1134  WBC 7.9  HGB 13.0  PLT 223    Recent Labs  10/27/15 1134 10/28/15 0740  NA 142 135  K 3.9 4.3  CL 102 98*  CO2 28 22  GLUCOSE 101* 171*  BUN 7 14  CREATININE 0.92 1.15*    Recent Labs  10/28/15 0027 10/28/15 0740  TROPONINI <0.03 <0.03    Recent Labs  10/27/15 1134  INR 1.09    Scheduled Meds: . amitriptyline  75 mg Oral QHS  . aspirin EC  81 mg Oral Daily  . atorvastatin  20 mg Oral q1800  . carvedilol  3.125 mg Oral BID WC  . diphenhydrAMINE  25 mg Intravenous Pre-Cath  . famotidine (PEPCID) IV  20 mg Intravenous Pre-Cath  . predniSONE  60 mg Oral Once  . sodium chloride flush  3 mL Intravenous Q12H   Continuous Infusions: . sodium chloride 50 mL/hr at 10/28/15 0810  . heparin 1,300 Units/hr (10/28/15 0550)   PRN Meds:.sodium chloride, acetaminophen, albuterol, fluticasone, HYDROcodone-acetaminophen, ondansetron, sodium chloride flush, tiZANidine   Imaging: Dg Chest 2 View  10/27/2015  CLINICAL DATA:  Shortness of breath.  Chest  pain on the left EXAM: CHEST  2 VIEW COMPARISON:  10/06/2015 FINDINGS: Normal heart size and stable aortic tortuosity.  Negative hila. Chronic elevation of the right diaphragm. There is no edema, consolidation, effusion, or pneumothorax. Cholecystectomy clips. No acute osseous finding. IMPRESSION: Stable.  No acute finding. Electronically Signed   By: Monte Fantasia M.D.   On: 10/27/2015 12:07   Ct Angio Chest Pe W/cm &/or Wo Cm  10/27/2015  CLINICAL DATA:  SOB x 3 days. Hx pulmonary emboli, asthma, HTN, breast cancer, pneumonia, and CHF. EXAM: CT ANGIOGRAPHY CHEST WITH CONTRAST TECHNIQUE: Multidetector CT imaging of the chest was performed using the standard protocol during bolus administration of intravenous contrast. Multiplanar CT image reconstructions and MIPs were obtained to evaluate the vascular anatomy. CONTRAST:  111mL OMNIPAQUE IOHEXOL 350 MG/ML SOLN COMPARISON:  10/06/2015 FINDINGS: No filling defects in the pulmonary arteries to suggest pulmonary emboli. Mild cardiomegaly. Aorta is normal caliber. No mediastinal, hilar, or axillary adenopathy. Chest wall soft tissues are unremarkable. Imaging into the upper abdomen shows no acute findings. Mild ground-glass opacities scattered in the lungs, best seen in the mid and lower lung zones. This could reflect early interstitial edema. No effusions. No acute bony abnormality or focal bone lesion. Review of the MIP images confirms the above findings. IMPRESSION: No evidence  of pulmonary embolus. Cardiomegaly. Scattered hazy ground-glass opacities in the lungs could reflect early interstitial edema. Electronically Signed   By: Rolm Baptise M.D.   On: 10/27/2015 15:23    Cardiac Studies: Echo 08/25/15 Study Conclusions  - Procedure narrative: Transthoracic echocardiography. Image quality was poor. The study was technically difficult. Intravenous contrast (Definity) was administered. - Left ventricle: The cavity size was normal. Wall thickness  was normal. Systolic function was moderately reduced. The estimated ejection fraction was in the range of 35% to 40%. There is anterior, anteroseptal, apical and inferoapical severe hypokinesis suggestive of LAD territory ischemia/infarct. No mural thrombus was identified with Definity contrast. Doppler parameters are consistent with abnormal left ventricular relaxation (grade 1 diastolic dysfunction). The E/e&' ratio >15, suggesting elevated LV filling pressure. - Mitral valve: Mildly thickened leaflets . There was mild regurgitation. - Left atrium: The atrium was at the upper limits of normal in size. - Right atrium: The atrium was at the upper limits of normal in size.  Impressions:  - Compared to the prior echo in 04/2015, the EF remains 35-40%. There is a marked LAD territory wall motion abnormality. No mural thrombus is noted with Definity contrast. LV filling pressure is elevated.   Assessment/Plan:  1. Shortness of breath (new) superimposed on chronic daily chest pain 2. Chronic systolic CHF 3. History of saddle PE & DVT in 04/2015 4. Essential HTN with history of marked orthostatic hypotension c/b syncope  PLAN: Cath this pm. Steroids and benadryl ordered for history of contrast reaction with IVP dye in past.   Kerin Ransom PA-C 10/28/2015, 10:41 AM [phone]  Decision was made yesterday to proceed with cardiac catheterization today. The patient is stable for this. It will be done later today.  Daryel November, MD

## 2015-10-28 NOTE — Progress Notes (Signed)
Subjective:  No chest pain  Objective:  Vital Signs in the last 24 hours: Temp:  [97.4 F (36.3 C)-99.3 F (37.4 C)] 97.4 F (36.3 C) (02/22 0900) Pulse Rate:  [77-120] 95 (02/22 0900) Resp:  [14-25] 18 (02/22 0900) BP: (97-148)/(64-111) 117/64 mmHg (02/22 0900) SpO2:  [94 %-100 %] 94 % (02/22 0900) Weight:  [220 lb 14.4 oz (100.2 kg)-225 lb 1.4 oz (102.1 kg)] 225 lb 1.4 oz (102.1 kg) (02/22 0513)  Intake/Output from previous day:  Intake/Output Summary (Last 24 hours) at 10/28/15 1041 Last data filed at 10/28/15 1000  Gross per 24 hour  Intake   1040 ml  Output   1850 ml  Net   -810 ml    Physical Exam: General appearance: alert, cooperative, no distress and moderately obese Lungs: clear to auscultation bilaterally Heart: regular rate and rhythm Extremities: 3+ Rt radial pulse, no LE edema   Rate: 100  Rhythm: normal sinus rhythm and sinus tachycardia  Lab Results:  Recent Labs  10/27/15 1134  WBC 7.9  HGB 13.0  PLT 223    Recent Labs  10/27/15 1134 10/28/15 0740  NA 142 135  K 3.9 4.3  CL 102 98*  CO2 28 22  GLUCOSE 101* 171*  BUN 7 14  CREATININE 0.92 1.15*    Recent Labs  10/28/15 0027 10/28/15 0740  TROPONINI <0.03 <0.03    Recent Labs  10/27/15 1134  INR 1.09    Scheduled Meds: . amitriptyline  75 mg Oral QHS  . aspirin EC  81 mg Oral Daily  . atorvastatin  20 mg Oral q1800  . carvedilol  3.125 mg Oral BID WC  . diphenhydrAMINE  25 mg Intravenous Pre-Cath  . famotidine (PEPCID) IV  20 mg Intravenous Pre-Cath  . predniSONE  60 mg Oral Once  . sodium chloride flush  3 mL Intravenous Q12H   Continuous Infusions: . sodium chloride 50 mL/hr at 10/28/15 0810  . heparin 1,300 Units/hr (10/28/15 0550)   PRN Meds:.sodium chloride, acetaminophen, albuterol, fluticasone, HYDROcodone-acetaminophen, ondansetron, sodium chloride flush, tiZANidine   Imaging: Dg Chest 2 View  10/27/2015  CLINICAL DATA:  Shortness of breath.  Chest  pain on the left EXAM: CHEST  2 VIEW COMPARISON:  10/06/2015 FINDINGS: Normal heart size and stable aortic tortuosity.  Negative hila. Chronic elevation of the right diaphragm. There is no edema, consolidation, effusion, or pneumothorax. Cholecystectomy clips. No acute osseous finding. IMPRESSION: Stable.  No acute finding. Electronically Signed   By: Monte Fantasia M.D.   On: 10/27/2015 12:07   Ct Angio Chest Pe W/cm &/or Wo Cm  10/27/2015  CLINICAL DATA:  SOB x 3 days. Hx pulmonary emboli, asthma, HTN, breast cancer, pneumonia, and CHF. EXAM: CT ANGIOGRAPHY CHEST WITH CONTRAST TECHNIQUE: Multidetector CT imaging of the chest was performed using the standard protocol during bolus administration of intravenous contrast. Multiplanar CT image reconstructions and MIPs were obtained to evaluate the vascular anatomy. CONTRAST:  142mL OMNIPAQUE IOHEXOL 350 MG/ML SOLN COMPARISON:  10/06/2015 FINDINGS: No filling defects in the pulmonary arteries to suggest pulmonary emboli. Mild cardiomegaly. Aorta is normal caliber. No mediastinal, hilar, or axillary adenopathy. Chest wall soft tissues are unremarkable. Imaging into the upper abdomen shows no acute findings. Mild ground-glass opacities scattered in the lungs, best seen in the mid and lower lung zones. This could reflect early interstitial edema. No effusions. No acute bony abnormality or focal bone lesion. Review of the MIP images confirms the above findings. IMPRESSION: No evidence  of pulmonary embolus. Cardiomegaly. Scattered hazy ground-glass opacities in the lungs could reflect early interstitial edema. Electronically Signed   By: Rolm Baptise M.D.   On: 10/27/2015 15:23    Cardiac Studies: Echo 08/25/15 Study Conclusions  - Procedure narrative: Transthoracic echocardiography. Image quality was poor. The study was technically difficult. Intravenous contrast (Definity) was administered. - Left ventricle: The cavity size was normal. Wall thickness  was normal. Systolic function was moderately reduced. The estimated ejection fraction was in the range of 35% to 40%. There is anterior, anteroseptal, apical and inferoapical severe hypokinesis suggestive of LAD territory ischemia/infarct. No mural thrombus was identified with Definity contrast. Doppler parameters are consistent with abnormal left ventricular relaxation (grade 1 diastolic dysfunction). The E/e&' ratio >15, suggesting elevated LV filling pressure. - Mitral valve: Mildly thickened leaflets . There was mild regurgitation. - Left atrium: The atrium was at the upper limits of normal in size. - Right atrium: The atrium was at the upper limits of normal in size.  Impressions:  - Compared to the prior echo in 04/2015, the EF remains 35-40%. There is a marked LAD territory wall motion abnormality. No mural thrombus is noted with Definity contrast. LV filling pressure is elevated.   Assessment/Plan:  1. Shortness of breath (new) superimposed on chronic daily chest pain 2. Chronic systolic CHF 3. History of saddle PE & DVT in 04/2015 4. Essential HTN with history of marked orthostatic hypotension c/b syncope  PLAN: Cath this pm. Steroids and benadryl ordered for history of contrast reaction with IVP dye in past.   Kerin Ransom PA-C 10/28/2015, 10:41 AM [phone]  Decision was made yesterday to proceed with cardiac catheterization today. The patient is stable for this. It will be done later today.  Daryel November, MD

## 2015-10-28 NOTE — Progress Notes (Signed)
ANTICOAGULATION CONSULT NOTE - Follow-up Consult  Pharmacy Consult for Heparin Indication: History saddle PE and DVT  Allergies  Allergen Reactions  . Gadolinium Derivatives Shortness Of Breath and Nausea And Vomiting  . Sulfa Antibiotics Swelling    "Ears swelled up like Dumbo"  . Aripiprazole     Dry mouth with sores   . Codeine Itching and Rash  . Oxycodone Itching  . Penicillins Hives    Has patient had a PCN reaction causing immediate rash, facial/tongue/throat swelling, SOB or lightheadedness with hypotension: Yes Has patient had a PCN reaction causing severe rash involving mucus membranes or skin necrosis: No Has patient had a PCN reaction that required hospitalization No Has patient had a PCN reaction occurring within the last 10 years: No If all of the above answers are "NO", then may proceed with Cephalosporin use.    Patient Measurements: Weight: 220 lb 14.4 oz (100.2 kg)  Ht:  64 in   IBW: 55 kg Heparin Dosing Weight: 75 kg  Vital Signs: Temp: 98.5 F (36.9 C) (02/22 0029) Temp Source: Oral (02/22 0029) BP: 129/76 mmHg (02/22 0029) Pulse Rate: 105 (02/22 0029)  Labs:  Recent Labs  10/27/15 1134 10/27/15 1936 10/28/15 0027 10/28/15 0352  HGB 13.0  --   --   --   HCT 40.6  --   --   --   PLT 223  --   --   --   APTT  --   --   --  63*  LABPROT 14.3  --   --   --   INR 1.09  --   --   --   HEPARINUNFRC  --   --   --  0.44  CREATININE 0.92  --   --   --   TROPONINI  --  <0.03 <0.03  --     Estimated Creatinine Clearance: 73.9 mL/min (by C-G formula based on Cr of 0.92).   Assessment: 60yo female presenting with SOB and chronic daily chest pain, and history of saddle PE + DVT in Aug 2016.  She was on Xarelto 20mg  daily at home, with her last dose on 2/20. Xarelto still affecting heparin level so using aPTT to monitor heparin. Pt on heparin bridge with plan for R & L heart cath 2/22 - scheduled for 1730. aPTT slightly subtherapeutic (63 sec) on 1200  units/hr.  Goal of Therapy:  Heparin level 0.3-0.7 APTT 66-102sec Monitor platelets by anticoagulation protocol: Yes   Plan:  Increase heparin to 1300 units/hr aPTT in 6 hours  Sherlon Handing, PharmD, BCPS Clinical pharmacist, pager [phone] 10/28/2015 5:06 AM

## 2015-10-28 NOTE — Progress Notes (Signed)
   10/28/15 1000  Clinical Encounter Type  Visited With Patient  Visit Type Initial  Referral From Nurse  Spiritual Encounters  Spiritual Needs Emotional;Prayer  Zillah asked to pop-in and visit with pt; pt sister passed 2 weeks ago; pt still grieving; pt Doristine Bosworth to visit later; pt procedure in Cath lab this afternoon.  Gwynn Burly

## 2015-10-28 NOTE — Research (Signed)
CADLAD Informed Consent   Subject Name: Patricia Fischer  Subject met inclusion and exclusion criteria.  The informed consent form, study requirements and expectations were reviewed with the subject and questions and concerns were addressed prior to the signing of the consent form.  The subject verbalized understanding of the trail requirements.  The subject agreed to participate in the CAADLAD trial and signed the informed consent.  The informed consent was obtained prior to performance of any protocol-specific procedures for the subject.  A copy of the signed informed consent was given to the subject and a copy was placed in the subject's medical record.  Hedrick,Nyan Dufresne W 10/28/2015, 7199

## 2015-10-29 ENCOUNTER — Other Ambulatory Visit: Payer: Self-pay

## 2015-10-29 ENCOUNTER — Encounter (HOSPITAL_COMMUNITY): Payer: Self-pay | Admitting: Interventional Cardiology

## 2015-10-29 DIAGNOSIS — I951 Orthostatic hypotension: Secondary | ICD-10-CM | POA: Diagnosis not present

## 2015-10-29 DIAGNOSIS — I5022 Chronic systolic (congestive) heart failure: Secondary | ICD-10-CM | POA: Diagnosis not present

## 2015-10-29 LAB — CBC
HEMATOCRIT: 34 % — AB (ref 36.0–46.0)
HEMOGLOBIN: 11.1 g/dL — AB (ref 12.0–15.0)
MCH: 28.4 pg (ref 26.0–34.0)
MCHC: 32.6 g/dL (ref 30.0–36.0)
MCV: 87 fL (ref 78.0–100.0)
Platelets: 179 10*3/uL (ref 150–400)
RBC: 3.91 MIL/uL (ref 3.87–5.11)
RDW: 14.2 % (ref 11.5–15.5)
WBC: 15.3 10*3/uL — ABNORMAL HIGH (ref 4.0–10.5)

## 2015-10-29 NOTE — Consult Note (Addendum)
   [facility] CM Inpatient Consult   10/29/2015  Patricia Fischer Jul 25, 1956 [id number] Patient evaluated for community based chronic disease management services with [place] [facility] as a benefit of patient's [employer]. Spoke with patient at bedside to explain [place] [facility] services. Patient states she would like the post hospital follow up and nursing.  She states she was offered the tele-monitoring program from her [employer] but she didn't sign up for it.  She states she does not have a reliable scale at home.  She is also concerned about affording her hospital bills and new medications. Patient will receive post hospital discharge call and will be evaluated for monthly home visits for assessments and disease process education.  Left contact information and [facility] literature at bedside. Will make Inpatient Case Manager aware that [place] Management following and patient's concerns about her bill.. Of note, [facility] Care Management services does not replace or interfere with any services that are arranged by inpatient case management or social work.  For additional questions or referrals please contact:   Patricia Brood, RN BSN [facility] Liaison  [phone] business mobile phone Toll free office [phone]  Patient endorse  Patricia Fischer as her primary care provider.  Consent form signed for [place] Management services.

## 2015-10-29 NOTE — Consult Note (Signed)
   [facility] CM Inpatient Consult   10/29/2015  Patricia Fischer Dec 04, 1955 [id number]  Patient was evaluated for [place] Management services for frequent admissions and with HX of HF, and possible new medications at discharge.  Came by to see the patient and she was receiving nursing care and admission assessment. Will follow up as time allows. For questions, please contact: Natividad Brood, RN BSN [facility] Liaison  [phone] business mobile phone Toll free office [phone]

## 2015-10-29 NOTE — Progress Notes (Signed)
Patient Name:  MCKENZIE DUHR, DOB: [birth year], MRN: [id number] Primary Doctor: [name], MD Primary Cardiologist:   Date: 10/29/2015   SUBJECTIVE:  Cardiac catheterization was done yesterday. No intervention was needed. Recommendation was to start [facility] for CHF. It is of note that the patient has had some difficulties with low blood pressure with losartan over time. She was not on the drug at the time of admission.   Past Medical History  Diagnosis Date  . Essential hypertension   . Asthma   . Environmental allergies   . Pneumonia 2012  . Blood transfusion [place]  . GERD (gastroesophageal reflux disease)   . Depression   . Breast cancer, left breast ([place]) 2011    DCIS  . Head injury 2011  . Anxiety   . Hemorrhoids   . Obesity   . Chronic migraine without aura, with intractable migraine, so stated, with status migrainosus March 2016  . Chronic systolic CHF (congestive heart failure) ([place]) 05/05/2015    a. Dx 04/2015 - EF initially 25-30% with RV dysfunction, f/u echo 08/2015 technically difficult, EF 35-40%, anterior, anteroseptal, apical and inferoapical severe hypokinesis suggestive of LAD territory ischemia/infarct, grade 1 DD, mild MR, RV normal.  . Syncope     a. Recurrent syncope in 2016 felt 2/2 orthostasis in setting of PE.  . Orthostatic hypotension   . Saddle pulmonary embolus ([place]) Aug. 2016    a. Dx 04/2015 - patient returned 05/2015 after noncompliance with Xarelto.  Marland Kitchen DVT (deep venous thrombosis) ([place]) Aug. 2016    a. Dx 04/2015 - patient returned 05/2015 after noncompliance with Xarelto.  Marland Kitchen LBBB (left bundle branch block)    Filed Vitals:   10/28/15 2114 10/28/15 2135 10/29/15 0220 10/29/15 0417  BP: 125/50 114/40 105/55 92/60  Pulse:    80  Temp:    97.3 F (36.3 C)  TempSrc:    Oral  Resp:   18 18  Weight:    225 lb 6.4 oz (102.241 kg)  SpO2: 94% 95% 97% 98%    Intake/Output Summary (Last 24 hours) at 10/29/15 1029 Last data  filed at 10/29/15 0858  Gross per 24 hour  Intake 1567.3 ml  Output    800 ml  Net  767.3 ml   Filed Weights   10/27/15 1855 10/28/15 0513 10/29/15 0417  Weight: 220 lb 14.4 oz (100.2 kg) 225 lb 1.4 oz (102.1 kg) 225 lb 6.4 oz (102.241 kg)     LABS: Basic Metabolic Panel:  Recent Labs  10/27/15 1134 10/28/15 0740  NA 142 135  K 3.9 4.3  CL 102 98*  CO2 28 22  GLUCOSE 101* 171*  BUN 7 14  CREATININE 0.92 1.15*  CALCIUM 9.4 9.4   Liver Function Tests:  Recent Labs  10/27/15 1134  AST 23  ALT 15  ALKPHOS 98  BILITOT 0.5  PROT 7.4  ALBUMIN 3.7   No results for input(s): LIPASE, AMYLASE in the last 72 hours. CBC:  Recent Labs  10/27/15 1134 10/29/15 0639  WBC 7.9 15.3*  NEUTROABS 4.3  --   HGB 13.0 11.1*  HCT 40.6 34.0*  MCV 86.4 87.0  PLT 223 179   Cardiac Enzymes:  Recent Labs  10/27/15 1936 10/28/15 0027 10/28/15 0740  TROPONINI <0.03 <0.03 <0.03   BNP: Invalid input(s): POCBNP D-Dimer: No results for input(s): DDIMER in the last 72 hours. Thyroid Function Tests:  Recent Labs  10/27/15 1936  TSH 1.095  RADIOLOGY: Dg Chest 2 View  10/27/2015  CLINICAL DATA:  Shortness of breath.  Chest pain on the left EXAM: CHEST  2 VIEW COMPARISON:  10/06/2015 FINDINGS: Normal heart size and stable aortic tortuosity.  Negative hila. Chronic elevation of the right diaphragm. There is no edema, consolidation, effusion, or pneumothorax. Cholecystectomy clips. No acute osseous finding. IMPRESSION: Stable.  No acute finding. Electronically Signed   By: Monte Fantasia M.D.   On: 10/27/2015 12:07   Dg Chest 2 View  10/06/2015  CLINICAL DATA:  Left side neck pain, shoulder pain for 2 weeks EXAM: CHEST  2 VIEW COMPARISON:  06/10/2015 FINDINGS: Cardiomediastinal silhouette is stable. There is chronic elevation of the right hemidiaphragm. No acute infiltrate or pleural effusion. No pulmonary edema. Mild degenerative changes mid and lower thoracic spine.  IMPRESSION: No active cardiopulmonary disease. Mild degenerative changes thoracic spine. Electronically Signed   By: Lahoma Crocker M.D.   On: 10/06/2015 13:53   Ct Angio Chest Pe W/cm &/or Wo Cm  10/27/2015  CLINICAL DATA:  SOB x 3 days. Hx pulmonary emboli, asthma, HTN, breast cancer, pneumonia, and CHF. EXAM: CT ANGIOGRAPHY CHEST WITH CONTRAST TECHNIQUE: Multidetector CT imaging of the chest was performed using the standard protocol during bolus administration of intravenous contrast. Multiplanar CT image reconstructions and MIPs were obtained to evaluate the vascular anatomy. CONTRAST:  128mL OMNIPAQUE IOHEXOL 350 MG/ML SOLN COMPARISON:  10/06/2015 FINDINGS: No filling defects in the pulmonary arteries to suggest pulmonary emboli. Mild cardiomegaly. Aorta is normal caliber. No mediastinal, hilar, or axillary adenopathy. Chest wall soft tissues are unremarkable. Imaging into the upper abdomen shows no acute findings. Mild ground-glass opacities scattered in the lungs, best seen in the mid and lower lung zones. This could reflect early interstitial edema. No effusions. No acute bony abnormality or focal bone lesion. Review of the MIP images confirms the above findings. IMPRESSION: No evidence of pulmonary embolus. Cardiomegaly. Scattered hazy ground-glass opacities in the lungs could reflect early interstitial edema. Electronically Signed   By: Rolm Baptise M.D.   On: 10/27/2015 15:23   Ct Angio Chest Pe W/cm &/or Wo Cm  10/06/2015  CLINICAL DATA:  Shortness of breath. Left-sided pain from neck to shoulder blades. EXAM: CT ANGIOGRAPHY CHEST WITH CONTRAST TECHNIQUE: Multidetector CT imaging of the chest was performed using the standard protocol during bolus administration of intravenous contrast. Multiplanar CT image reconstructions and MIPs were obtained to evaluate the vascular anatomy. CONTRAST:  100 cc of Isovue 370 COMPARISON:  06/10/2015. FINDINGS: Mediastinum: The heart size appears normal. There is no  pericardial effusion. The trachea appears patent and is midline. Unremarkable appearance of the esophagus. No enlarged mediastinal or hilar lymph nodes identified. Mild aortic atherosclerosis. No axillary or supraclavicular adenopathy. The main pulmonary artery is patent. No right or left main pulmonary artery embolus identified. There is no lobar or segmental pulmonary artery filling defects. Lungs/Pleura: No pleural fluid identified. No airspace consolidation or atelectasis. Upper Abdomen: Mild diffuse hepatic steatosis noted previous cholecystectomy. The visualized portions of the spleen, adrenal glands pancreas and kidneys appear normal Musculoskeletal: Degenerative disc disease noted within the lower thoracic spine Review of the MIP images confirms the above findings. IMPRESSION: 1. No acute pulmonary embolus 2. No active cardiopulmonary abnormalities noted. Electronically Signed   By: Kerby Moors M.D.   On: 10/06/2015 18:53   US Carotid Bilateral  10/07/2015  CLINICAL DATA:  Syncopal episode. History of hypertension and hyperlipidemia. EXAM: BILATERAL CAROTID DUPLEX ULTRASOUND TECHNIQUE: Pearline Cables scale imaging, color  Doppler and duplex ultrasound were performed of bilateral carotid and vertebral arteries in the neck. COMPARISON:  Head CT - 04/20/2015 FINDINGS: Criteria: Quantification of carotid stenosis is based on velocity parameters that correlate the residual internal carotid diameter with NASCET-based stenosis levels, using the diameter of the distal internal carotid lumen as the denominator for stenosis measurement. The following velocity measurements were obtained: RIGHT ICA:  73/28 cm/sec CCA:  123XX123 cm/sec SYSTOLIC ICA/CCA RATIO:  0.8 DIASTOLIC ICA/CCA RATIO:  1.0 ECA:  86 cm/sec LEFT ICA:  91/38 cm/sec CCA:  123456 cm/sec SYSTOLIC ICA/CCA RATIO:  0.9 DIASTOLIC ICA/CCA RATIO:  1.3 ECA:  160 cm/sec RIGHT CAROTID ARTERY: There is no significant atherosclerotic plaque or intimal wall thickening affecting  the interrogated portions of the right carotid system. There are no elevated peak systolic velocity within the interrogated course of the right internal carotid artery to suggest a hemodynamically significant stenosis. RIGHT VERTEBRAL ARTERY:  Antegrade flow LEFT CAROTID ARTERY: There is no grayscale evidence of significant intimal thickening or atherosclerotic plaque affecting the interrogated portions of the left carotid system. There are no elevated peak systolic velocities within the interrogated course of the left internal carotid artery to suggest a hemodynamically significant stenosis. LEFT VERTEBRAL ARTERY:  Antegrade flow Incidental note is made of a cardiac arrhythmia (representative images 31, 49, 59 and 64). IMPRESSION: Incidental note is made of a cardiac arrhythmia. Further evaluation with EKG monitoring could be performed as clinically indicated. Otherwise, unremarkable carotid Doppler ultrasound. Electronically Signed   By: Sandi Mariscal M.D.   On: 10/07/2015 14:33    PHYSICAL EXAM   patient is oriented to person time and place. Affect is normal. Lungs are clear. Respiratory effort is nonlabored. Cardiac exam reveals S1 and S2. Abdomen is soft. There is no peripheral edema.   TELEMETRY: I personally reviewed telemetry today October 29, 2015. There is normal sinus rhythm.   ASSESSMENT AND PLAN:    Chronic systolic CHF (congestive heart failure) (HCC)     Cardiac cath was done yesterday and no intervention was needed. The plan was to try to start [facility]. She received this last night. Her pressure is low today. I'm hesitant to continue the drug.    Orthostatic hypotension     The patient relates that she has had ongoing problems with symptomatic hypotension from losartan. Her pressure in the hospital had been in the range of [id number] systolic. She was not on losartan at the time of admission. She received one dose of Entresto last night. Her pressure today is 93/60. I'm very hasn't about  continuing the medication. Delene Loll will be stopped. We will watch her carefully in the hospital today to be sure that she does not develop symptomatic hypotension. He will have to be decided over time what medicines can be tried again. It does not seem prudent to give a second dose of Entresto today.   Dola Argyle 10/29/2015 10:29 AM

## 2015-10-29 NOTE — Progress Notes (Signed)
Patient is agreeable to the [place] study for CHF, referral made. Noted, possible discharge home on Entresto; CM to follow up; [employer] [phone]

## 2015-10-30 DIAGNOSIS — I429 Cardiomyopathy, unspecified: Secondary | ICD-10-CM | POA: Diagnosis not present

## 2015-10-30 DIAGNOSIS — Z86711 Personal history of pulmonary embolism: Secondary | ICD-10-CM

## 2015-10-30 DIAGNOSIS — I5022 Chronic systolic (congestive) heart failure: Secondary | ICD-10-CM | POA: Diagnosis not present

## 2015-10-30 LAB — CBC
HCT: 35.4 % — ABNORMAL LOW (ref 36.0–46.0)
Hemoglobin: 11.4 g/dL — ABNORMAL LOW (ref 12.0–15.0)
MCH: 28.6 pg (ref 26.0–34.0)
MCHC: 32.2 g/dL (ref 30.0–36.0)
MCV: 88.9 fL (ref 78.0–100.0)
Platelets: 165 10*3/uL (ref 150–400)
RBC: 3.98 MIL/uL (ref 3.87–5.11)
RDW: 14.5 % (ref 11.5–15.5)
WBC: 11 10*3/uL — ABNORMAL HIGH (ref 4.0–10.5)

## 2015-10-30 MED ORDER — HYDROCODONE-ACETAMINOPHEN 5-325 MG PO TABS
1.0000 | ORAL_TABLET | ORAL | Status: DC | PRN
  Administered 2015-10-30 – 2015-10-31 (×7): 1 via ORAL
  Filled 2015-10-30 (×7): qty 1

## 2015-10-30 NOTE — Progress Notes (Addendum)
     SUBJECTIVE: Pt notes that her breathing is much better but she has dizziness this am that is slightly worse than her baseline dizziness at home. She also has a headache.   Tele: sinus  BP 113/62 mmHg  Pulse 82  Temp(Src) 97.9 F (36.6 C) (Oral)  Resp 17  Wt 224 lb 14.4 oz (102.014 kg)  SpO2 96%  Intake/Output Summary (Last 24 hours) at 10/30/15 1040 Last data filed at 10/30/15 0851  Gross per 24 hour  Intake   1284 ml  Output   3026 ml  Net  -1742 ml    PHYSICAL EXAM General: Well developed, well nourished, in no acute distress. Alert and oriented x 3.  Psych:  Good affect, responds appropriately Neck: No JVD. No masses noted.  Lungs: Clear bilaterally with no wheezes or rhonci noted.  Heart: RRR with no murmurs noted. Abdomen: Bowel sounds are present. Soft, non-tender.  Extremities: No lower extremity edema.   LABS: Basic Metabolic Panel:  Recent Labs  10/27/15 1134 10/28/15 0740  NA 142 135  K 3.9 4.3  CL 102 98*  CO2 28 22  GLUCOSE 101* 171*  BUN 7 14  CREATININE 0.92 1.15*  CALCIUM 9.4 9.4   CBC:  Recent Labs  10/27/15 1134 10/29/15 0639 10/30/15 0320  WBC 7.9 15.3* 11.0*  NEUTROABS 4.3  --   --   HGB 13.0 11.1* 11.4*  HCT 40.6 34.0* 35.4*  MCV 86.4 87.0 88.9  PLT 223 179 165   Cardiac Enzymes:  Recent Labs  10/27/15 1936 10/28/15 0027 10/28/15 0740  TROPONINI <0.03 <0.03 <0.03    Current Meds: . amitriptyline  75 mg Oral QHS  . aspirin EC  81 mg Oral Daily  . atorvastatin  20 mg Oral q1800  . carvedilol  3.125 mg Oral BID WC  . predniSONE  60 mg Oral Once  . rivaroxaban  20 mg Oral Q supper  . sodium chloride flush  3 mL Intravenous Q12H  . sodium chloride flush  3 mL Intravenous Q12H     ASSESSMENT AND PLAN:  1. Non-ischemic cardiomyopathy: Cath this admission with minimal CAD. Continue medical therapy with Coreg. She has not tolerated ARBs and did not tolerate Entresto yesterday due to hypotension.   2. Chronic  systolic CHF: Volume status improved after diuresis. Consider low dose Lasix before discharge.   3. History of PE: Continue Xarelto  Possible d/c home later today if her headache and dizziness resolves.   Patricia Fischer  2/24/201710:40 AM

## 2015-10-31 ENCOUNTER — Other Ambulatory Visit: Payer: Self-pay | Admitting: Student

## 2015-10-31 DIAGNOSIS — I5022 Chronic systolic (congestive) heart failure: Secondary | ICD-10-CM | POA: Diagnosis not present

## 2015-10-31 LAB — CBC
HCT: 35.7 % — ABNORMAL LOW (ref 36.0–46.0)
HEMOGLOBIN: 11.2 g/dL — AB (ref 12.0–15.0)
MCH: 27.3 pg (ref 26.0–34.0)
MCHC: 31.4 g/dL (ref 30.0–36.0)
MCV: 86.9 fL (ref 78.0–100.0)
PLATELETS: 163 10*3/uL (ref 150–400)
RBC: 4.11 MIL/uL (ref 3.87–5.11)
RDW: 14.2 % (ref 11.5–15.5)
WBC: 7.4 10*3/uL (ref 4.0–10.5)

## 2015-10-31 MED ORDER — PROCHLORPERAZINE MALEATE 5 MG PO TABS
5.0000 mg | ORAL_TABLET | Freq: Once | ORAL | Status: AC
  Administered 2015-10-31: 5 mg via ORAL
  Filled 2015-10-31: qty 1

## 2015-10-31 MED ORDER — CARVEDILOL 3.125 MG PO TABS
3.1250 mg | ORAL_TABLET | Freq: Two times a day (BID) | ORAL | Status: DC
  Administered 2015-10-31: 3.125 mg via ORAL
  Filled 2015-10-31: qty 1

## 2015-10-31 MED ORDER — ATORVASTATIN CALCIUM 20 MG PO TABS
20.0000 mg | ORAL_TABLET | Freq: Every day | ORAL | Status: DC
  Administered 2015-10-31: 20 mg via ORAL
  Filled 2015-10-31: qty 1

## 2015-10-31 MED ORDER — PROCHLORPERAZINE MALEATE 10 MG PO TABS: 10.0000 mg | ORAL_TABLET | Freq: Two times a day (BID) | ORAL | Status: DC | PRN

## 2015-10-31 MED ORDER — DIPHENHYDRAMINE HCL 25 MG PO CAPS
25.0000 mg | ORAL_CAPSULE | Freq: Once | ORAL | Status: AC
  Administered 2015-10-31: 25 mg via ORAL
  Filled 2015-10-31: qty 1

## 2015-10-31 NOTE — Assessment & Plan Note (Signed)
Recent saddle PE with significant cardiac dysfunction; on anticoagulant, but with acute onset of worsening SHOB on Saturday; to ER via EMS

## 2015-10-31 NOTE — Assessment & Plan Note (Signed)
I spoke with her cardiologist who agreed with having her go to the emergency department; with her weight loss, as well as recent PE and then rapid onset of worsening SHOB on Saturday, she have new clot, CHF, etc.; EMS was called and they transported patient to the ER

## 2015-10-31 NOTE — Discharge Instructions (Addendum)
Please follow with your primary care doctor in the next 2 days for a check-up. They must obtain records for further management.   Do not hesitate to return to the Emergency Department for any new, worsening or concerning symptoms.    Shortness of Breath Shortness of breath means you have trouble breathing. It could also mean that you have a medical problem. You should get immediate medical care for shortness of breath. CAUSES   Not enough oxygen in the air such as with high altitudes or a smoke-filled room.  Certain lung diseases, infections, or problems.  Heart disease or conditions, such as angina or heart failure.  Low red blood cells (anemia).  Poor physical fitness, which can cause shortness of breath when you exercise.  Chest or back injuries or stiffness.  Being overweight.  Smoking.  Anxiety, which can make you feel like you are not getting enough air. DIAGNOSIS  Serious medical problems can often be found during your physical exam. Tests may also be done to determine why you are having shortness of breath. Tests may include:  Chest X-rays.  Lung function tests.  Blood tests.  An electrocardiogram (ECG).  An ambulatory electrocardiogram. An ambulatory ECG records your heartbeat patterns over a 24-hour period.  Exercise testing.  A transthoracic echocardiogram (TTE). During echocardiography, sound waves are used to evaluate how blood flows through your heart.  A transesophageal echocardiogram (TEE).  Imaging scans. Your health care provider may not be able to find a cause for your shortness of breath after your exam. In this case, it is important to have a follow-up exam with your health care provider as directed.  TREATMENT  Treatment for shortness of breath depends on the cause of your symptoms and can vary greatly. HOME CARE INSTRUCTIONS   Do not smoke. Smoking is a common cause of shortness of breath. If you smoke, ask for help to quit.  Avoid being  around chemicals or things that may bother your breathing, such as paint fumes and dust.  Rest as needed. Slowly resume your usual activities.  If medicines were prescribed, take them as directed for the full length of time directed. This includes oxygen and any inhaled medicines.  Keep all follow-up appointments as directed by your health care provider. SEEK MEDICAL CARE IF:   Your condition does not improve in the time expected.  You have a hard time doing your normal activities even with rest.  You have any new symptoms. SEEK IMMEDIATE MEDICAL CARE IF:   Your shortness of breath gets worse.  You feel light-headed, faint, or develop a cough not controlled with medicines.  You start coughing up blood.  You have pain with breathing.  You have chest pain or pain in your arms, shoulders, or abdomen.  You have a fever.  You are unable to walk up stairs or exercise the way you normally do. MAKE SURE YOU:  Understand these instructions.  Will watch your condition.  Will get help right away if you are not doing well or get worse.   This information is not intended to replace advice given to you by your health care provider. Make sure you discuss any questions you have with your health care provider.   Document Released: 05/17/2001 Document Revised: 08/27/2013 Document Reviewed: 11/07/2011 Elsevier Interactive Patient Education [employer].

## 2015-10-31 NOTE — Assessment & Plan Note (Signed)
Acute worsening of CHF; discussed with cardiologist; called EMS, to ER

## 2015-10-31 NOTE — Discharge Summary (Signed)
Discharge Summary    Patient ID: Patricia Fischer,  MRN: [id number], DOB/AGE: 61-Apr-1957 60 y.o.  Admit date: 10/27/2015 Discharge date: 10/31/2015  Primary Care Provider: Enid Fischer Primary Cardiologist: Dr. Tamala Fischer  Discharge Diagnoses    Active Problems:   Chronic systolic CHF (congestive heart failure) (HCC)   HTN (hypertension)   Orthostatic hypotension   Chest pain   Shortness of breath   History of pulmonary embolism    History of Present Illness     Patricia Fischer is a 60 y.o. female with past medical history of HTN with orthostatic hypotension (with hx of recurrent syncope due to orthostasis), chronic systolic CHF (EF [id number] in 08/2015), saddle PE/DVT (diagnosed 04/2015, on Xarelto), depression/anxiety, asthma, GERD, breast CA 2011, LBBB, and obesity who presented to [facility] ED on 10/27/2015 with worsening dyspnea.  She had been seen by her PCP earlier that day for a routine OV and mentioned that since Saturday 10/24/15, she had increased SOB with talking as well as walking around. She also reported having chronic daily chest pain 24/7 since her PE in August, unchanged recently and without any change with exertion, inspiration or palpation.    Hospital Course     Consultants: None  In the ED, her CBC and BMET were unremarkable. Initial troponin and cyclic troponin values were negative. CXR showed no acute findings. CTA showed no evidence of a PE.  Due to her known cardiomyopathy (no prior ischemic evaluation) and no explanation for her worsening dyspnea, it was decided a right and left heart catheterization should be pursued. The risks and benefits of the procedure were discussed with the patient and she agreed to proceed. Her Xarelto was held prior to the procedure and she was bridged with Heparin with the assistance of Pharmacy.  Her catheterization was performed on 10/28/2015 and showed widely patent coronary arteries. Her EF was estimated at 35%. The full  report is included below. No complications were noted during or following the procedure.  It had been recommended at the time of cath she start Entresto but she reported a history of hypotension with Losartan. She received the medication the evening of her cath but had low BP's throughout the night, therefore it was discontinued the following day.  On 10/30/2015, she reported having episodes of dizziness and headaches which were slightly worse than her "baseline". Her headaches were improved with Compazine, Tylenol, and Vicodin.  On 10/31/2015, she reported continued improvement in her dizziness and headaches. She had been given Compazine and Benadryl earlier in the morning which resolved her headache completely. Vitals and lab results were thoroughly reviewed. She was last examined by Dr. Curt Fischer and deemed stable for discharge.   She has a scheduled follow-up appointment with Dr. Tamala Fischer on 11/25/2015 and wishes to keep her appointment for that date. She Patricia Fischer notify our office if she develops acute symptoms and needs to be seen sooner. At the time of discharge, she requested a refill of her Vicodin. In reviewing Epic, this had recently been prescribed earlier this month. I contacted her pharmacy and they verified she had picked up the Rx on 10/08/2015. I informed her she would need to obtain refills of the medication from her PCP if needed. She was given a prescription for Compazine, as this has been started at the time of her admission while in the ED to help with her headaches. She was informed refills for this medication would need to come from her PCP. She voiced understanding  of this prior to discharge.  _____________  Discharge Vitals Blood pressure 148/88, pulse 81, temperature 97.8 F (36.6 C), temperature source Oral, resp. rate 18, weight 226 lb 3.2 oz (102.604 kg), SpO2 98 %.  Filed Weights   10/29/15 0417 10/30/15 0448 10/31/15 0426  Weight: 225 lb 6.4 oz (102.241 kg) 224 lb 14.4 oz (102.014  kg) 226 lb 3.2 oz (102.604 kg)    Labs & Radiologic Studies     CBC  Recent Labs  10/30/15 0320 10/31/15 0355  WBC 11.0* 7.4  HGB 11.4* 11.2*  HCT 35.4* 35.7*  MCV 88.9 86.9  PLT 165 [id number]   Basic Metabolic Panel No results for input(s): NA, K, CL, CO2, GLUCOSE, BUN, CREATININE, CALCIUM, MG, PHOS in the last 72 hours. Liver Function Tests No results for input(s): AST, ALT, ALKPHOS, BILITOT, PROT, ALBUMIN in the last 72 hours. No results for input(s): LIPASE, AMYLASE in the last 72 hours. Cardiac Enzymes No results for input(s): CKTOTAL, CKMB, CKMBINDEX, TROPONINI in the last 72 hours. BNP Invalid input(s): POCBNP  Dg Chest 2 View: 10/27/2015  CLINICAL DATA:  Shortness of breath.  Chest pain on the left EXAM: CHEST  2 VIEW COMPARISON:  10/06/2015 FINDINGS: Normal heart size and stable aortic tortuosity.  Negative hila. Chronic elevation of the right diaphragm. There is no edema, consolidation, effusion, or pneumothorax. Cholecystectomy clips. No acute osseous finding. IMPRESSION: Stable.  No acute finding. Electronically Signed   By: Patricia Fischer M.D.   On: 10/27/2015 12:07   Ct Angio Chest Pe W/cm &/or Wo Cm: 10/27/2015  CLINICAL DATA:  SOB x 3 days. Hx pulmonary emboli, asthma, HTN, breast cancer, pneumonia, and CHF. EXAM: CT ANGIOGRAPHY CHEST WITH CONTRAST TECHNIQUE: Multidetector CT imaging of the chest was performed using the standard protocol during bolus administration of intravenous contrast. Multiplanar CT image reconstructions and MIPs were obtained to evaluate the vascular anatomy. CONTRAST:  132mL OMNIPAQUE IOHEXOL 350 MG/ML SOLN COMPARISON:  10/06/2015 FINDINGS: No filling defects in the pulmonary arteries to suggest pulmonary emboli. Mild cardiomegaly. Aorta is normal caliber. No mediastinal, hilar, or axillary adenopathy. Chest wall soft tissues are unremarkable. Imaging into the upper abdomen shows no acute findings. Mild ground-glass opacities scattered in the lungs, best  seen in the mid and lower lung zones. This could reflect early interstitial edema. No effusions. No acute bony abnormality or focal bone lesion. Review of the MIP images confirms the above findings. IMPRESSION: No evidence of pulmonary embolus. Cardiomegaly. Scattered hazy ground-glass opacities in the lungs could reflect early interstitial edema. Electronically Signed   By: Rolm Baptise M.D.   On: 10/27/2015 15:23    Diagnostic Studies/Procedures     Cardiac Catheterization: 10/28/2015 Coronary Findings    Dominance: Right   Left Anterior Descending   . First Septal Branch   The vessel is small in size.     Left Circumflex   . Ost Cx lesion, 50% stenosed.       Right Heart Pressures Hemodynamic findings consistent with mild pulmonary hypertension. LV EDP is normal.   1. There is moderate to severe left ventricular systolic dysfunction. 2. Ost Cx lesion, 50% stenosed.   Widely patent coronary arteries. There is ostial 50% narrowing of the circumflex that I feel represents catheter-induced spasm. No high-grade obstruction is noted in any major or branch coronary vessel.  Left ventricular systolic dysfunction with estimated ejection fraction in the 35% range. Elevated pulmonary capillary wedge pressure of 22 mmHg an EDP of 18 mmHg.  Overall  findings suggest nonischemic cardiomyopathy  RECOMMENDATIONS:   Resume Xarelto  Start Entresto 24/26 mg BID dose  Optimize beta blocker therapy    Disposition   Pt is being discharged home today in good condition.  Follow-up Plans & Appointments    Follow-up Information    Follow up with Sinclair Grooms, MD. Go on 11/25/2015.   Specialty:  Cardiology   Why:  Cardiology Follow-Up on 11/25/2015 at 9:30AM.   Contact information:   [address] [place] [address] [phone]       Follow up with Patricia Derry, MD. Go on 11/11/2015.   Specialty:  Family Medicine   Why:  @ 10:30am   Contact information:    [place] [address] [phone]      Discharge Instructions    AMB Referral to [facility]    Complete by:  As directed   Reason for consult:  Frequent hospital stays - care management and social needs post hospital follow up  Diagnoses of:  Heart Failure  Expected date of contact:  1-3 days (reserved for hospital discharges)  Please assign to community nurse for transition of care calls and assess for home visits. Please assess for social worker needs.  Patient likely to discharge on new medications, she says.  Questions please call:  Natividad Brood, RN BSN [facility] Liaison  [phone] business mobile phone Toll free office [phone]     Diet - low sodium heart healthy    Complete by:  As directed      Increase activity slowly    Complete by:  As directed            Discharge Medications   Discharge Medication List as of 10/31/2015  4:35 PM    CONTINUE these medications which have CHANGED   Details  prochlorperazine (COMPAZINE) 10 MG tablet Take 1 tablet (10 mg total) by mouth 2 (two) times daily as needed (Headache)., Starting 10/31/2015, Until Discontinued, Print      CONTINUE these medications which have NOT CHANGED   Details  albuterol (PROVENTIL HFA;VENTOLIN HFA) 108 (90 BASE) MCG/ACT inhaler Inhale 2 puffs into the lungs every 6 (six) hours as needed for shortness of breath. , Until Discontinued, Historical Med    amitriptyline (ELAVIL) 25 MG tablet Take 3 tablets (75 mg total) by mouth at bedtime., Starting 09/06/2015, Until Discontinued, Normal    aspirin EC 81 MG tablet Take 324 mg by mouth once., Historical Med    atorvastatin (LIPITOR) 20 MG tablet Take 1 tablet (20 mg total) by mouth daily at 6 PM., Starting 08/27/2015, Until Discontinued, Normal    Blood Pressure Monitoring (BLOOD PRESSURE CUFF) MISC 1 Device by Does not apply route once., Starting 05/20/2015, Print    carvedilol (COREG) 6.25 MG tablet Take 0.5  tablets by mouth 2 (two) times daily. Take 1/2 of tablet twice daily, Starting 09/02/2015, Until Discontinued, Historical Med    fluticasone (FLONASE) 50 MCG/ACT nasal spray Place 1 spray into both nostrils daily as needed for allergies or rhinitis. , Starting 07/04/2014, Until Discontinued, Historical Med    HYDROcodone-acetaminophen (NORCO/VICODIN) 5-325 MG tablet Take 1 tablet by mouth every 6 (six) hours as needed for moderate pain., Starting 10/06/2015, Until Discontinued, Print    losartan (COZAAR) 50 MG tablet Take 25 mg by mouth daily. Take 1/2 tablet by mouth daily, Starting 09/04/2015, Until Discontinued, Historical Med    nitroGLYCERIN (NITROSTAT) 0.4 MG SL tablet Place 0.4 mg  under the tongue every 5 (five) minutes as needed for chest pain., Until Discontinued, Historical Med    ondansetron (ZOFRAN ODT) 4 MG disintegrating tablet Take 1 tablet (4 mg total) by mouth every 8 (eight) hours as needed for nausea or vomiting., Starting 03/18/2015, Until Discontinued, Normal    rivaroxaban (XARELTO) 20 MG TABS tablet Take 1 tablet (20 mg total) by mouth daily with supper., Starting 06/10/2015, Until Discontinued, No Print    tiZANidine (ZANAFLEX) 2 MG tablet Take 1-2 tablets (2-4 mg total) by mouth at bedtime as needed for muscle spasms., Starting 10/06/2015, Until Discontinued, Print          Allergies Allergies  Allergen Reactions  . Gadolinium Derivatives Shortness Of Breath and Nausea And Vomiting  . Sulfa Antibiotics Swelling    "Ears swelled up like Dumbo"  . Aripiprazole     Dry mouth with sores   . Codeine Itching and Rash  . Oxycodone Itching  . Penicillins Hives    Has patient had a PCN reaction causing immediate rash, facial/tongue/throat swelling, SOB or lightheadedness with hypotension: Yes Has patient had a PCN reaction causing severe rash involving mucus membranes or skin necrosis: No Has patient had a PCN reaction that required hospitalization No Has patient had a  PCN reaction occurring within the last 10 years: No If all of the above answers are "NO", then may proceed with Cephalosporin use.     Outstanding Labs/Studies   None  Duration of Discharge Encounter   Greater than 30 minutes including physician time.  Signed, Erma Heritage, PA-C 10/31/2015, 8:06 PM  I have seen and examined this patient with Bernerd Pho on 10/31/15.  Agree with above, note added to reflect my findings.  On exam, regular rhythm, no murmurs, lungs clear.  She presented to the hospital with SOB and had cardiac cath showing EF 35% and patient coronary arteries.  Had headaches relieved by benadryl and compazine as well as Norco.  Patient complained of dizziness and Entresto was held.  At home baseline dizziness today with improved headache symptoms, stable for discharge.    Dalinda Heidt M. Tilly Pernice MD 11/01/2015 7:03 AM

## 2015-10-31 NOTE — Progress Notes (Signed)
     SUBJECTIVE: Pt notes that her breathing is much better but continues to have dizziness this am, but similar to her baseline dizziness at home. She also has a headache.   Tele: sinus  BP 116/76 mmHg  Pulse 74  Temp(Src) 97.4 F (36.3 C) (Oral)  Resp 18  Wt 226 lb 3.2 oz (102.604 kg)  SpO2 98%  Intake/Output Summary (Last 24 hours) at 10/31/15 1013 Last data filed at 10/31/15 [id number]  Gross per 24 hour  Intake   1280 ml  Output   2300 ml  Net  -1020 ml    PHYSICAL EXAM General: Well developed, well nourished, in no acute distress. Alert and oriented x 3.  Psych:  Good affect, responds appropriately Neck: No JVD. No masses noted.  Lungs: Clear bilaterally with no wheezes or rhonci noted.  Heart: RRR with no murmurs noted. Abdomen: Bowel sounds are present. Soft, non-tender.  Extremities: No lower extremity edema.   LABS: Basic Metabolic Panel: No results for input(s): NA, K, CL, CO2, GLUCOSE, BUN, CREATININE, CALCIUM, MG, PHOS in the last 72 hours. CBC:  Recent Labs  10/30/15 0320 10/31/15 0355  WBC 11.0* 7.4  HGB 11.4* 11.2*  HCT 35.4* 35.7*  MCV 88.9 86.9  PLT 165 163   Cardiac Enzymes: No results for input(s): CKTOTAL, CKMB, CKMBINDEX, TROPONINI in the last 72 hours.  Current Meds: . amitriptyline  75 mg Oral QHS  . aspirin EC  81 mg Oral Daily  . atorvastatin  20 mg Oral q1800  . carvedilol  3.125 mg Oral BID WC  . diphenhydrAMINE  25 mg Oral Once  . predniSONE  60 mg Oral Once  . prochlorperazine  5 mg Oral Once  . rivaroxaban  20 mg Oral Q supper  . sodium chloride flush  3 mL Intravenous Q12H  . sodium chloride flush  3 mL Intravenous Q12H     ASSESSMENT AND PLAN:  1. Non-ischemic cardiomyopathy: Cath this admission with minimal CAD. Continue medical therapy with Coreg. She has not tolerated ARBs and did not tolerate Entresto yesterday due to hypotension.  May need to hold coreg due to dizziness.  May be discharged if improves throughout the  day.  2. Chronic systolic CHF: Volume status improved after diuresis. Consider low dose Lasix before discharge.   3. History of PE: Continue Xarelto  4. Headache: has had Norco, Lynniah Janoski try benadryl/compazine to see if this gives relief.  Possible d/c home later today if her headache and dizziness resolves.   Krimson Massmann Meredith Leeds  2/25/201710:13 AM

## 2015-11-01 ENCOUNTER — Other Ambulatory Visit: Payer: Self-pay | Admitting: Family Medicine

## 2015-11-01 DIAGNOSIS — I2692 Saddle embolus of pulmonary artery without acute cor pulmonale: Secondary | ICD-10-CM

## 2015-11-01 DIAGNOSIS — R071 Chest pain on breathing: Secondary | ICD-10-CM

## 2015-11-01 NOTE — Assessment & Plan Note (Signed)
With secondary pain since event; refer to pain clinic

## 2015-11-01 NOTE — Assessment & Plan Note (Signed)
Chronic chest pain left side since saddle PE; refer to pain clinic

## 2015-11-01 NOTE — Progress Notes (Signed)
Patient was on controlled substance contract with me and specifically asked hospital MD to give her a refill of her pain medicine before she left the hospital See discharge summary I am going to refer her to the pain center to address her chronic pain following PE No more narcotics from this office unfortunately

## 2015-11-02 ENCOUNTER — Other Ambulatory Visit: Payer: Self-pay | Admitting: *Deleted

## 2015-11-02 ENCOUNTER — Encounter: Payer: Self-pay | Admitting: *Deleted

## 2015-11-02 NOTE — Patient Outreach (Signed)
[place] [facility]) Care Management  11/02/2015  Patricia Fischer 1955/10/11 [id number]   Initial transition of care call RNCM placed call to patient as part of transition of care program, no answer , I left a HIPAA compliant message with my return call back number.  Plan Await return call, if no response I will attempt contacting patient in the next 24 hours.   [name], RN, [place] Management [phone]- Mobile [phone]- Toll Free Main Office

## 2015-11-02 NOTE — Patient Outreach (Signed)
[place] [place] [facility]) Care Management  11/02/2015  Patricia Fischer 03/10/1956 [id number]  Initial transition of care  RN care manager spoke with patient regarding transition of program, which she has signed written consent for, [place] verified. Patricia Fischer states she is doing fairly well, improved headache, denies dizziness, chest pain or shortness of breath. Patient reports she has been able to get prescriptions filled, and taking medications, patient reports she is not taking Losartan, states it has been put on hold due to her recent problems with low blood pressure and passing out.  Patricia Fischer discussed that she does not have scales to weigh on at home, and she does not want to use the scale program with her insurance company at present, they have called her recently. She declines to use the scale program until she is more stable she states she has been back and forth in the hospital, and staying at her son's home , then her home is wants to get more settled before she begins the program because you have to weigh 5 out of 7 days to be a part of the program and she doesn't want to start then stop or move scale to her son's when she is staying there. Educated patient the patient on the benefits of self monitoring and encouraged regarding benefits of scale program. Patient also discussed that she is not able to afford a blood pressure cuff, to monitor blood pressure.   Patricia Fischer discussed her upcoming appointment with PCP on 3/8, and cardiologist and her son or daughter in law will be able to provide transportation.  Plan Patient will be active with transition of care program Patient will notify MD of new or worsen symptoms.,  RNCM will evaluate resources for obtaining a blood cuff for patient  [facility] CM Care Plan Problem One        Most Recent Value   Care Plan Problem One  High Risk for hospital readmission   Role Documenting the Problem One  Care Management [place] for Problem One  Active   [facility] Long Term Goal (31-90 days)  Patient will not experience a hospital admission in the next 31 days   [facility] Long Term Goal Start Date  11/02/15   Interventions for Problem One Long Term Goal  Educated patient on the transition of care program, provided my contact infomation, reinforced notifying MD sooner for new symptoms or concerns, taking medicaiton as prescribed.   [facility] CM Short Term Goal #1 (0-30 days)  Patient will attend PCP appointment in the next 14 days   [facility] CM Short Term Goal #1 Start Date  11/02/15   Interventions for Short Term Goal #1  Verified that patient has transportation, and date of pcp appointment   [facility] CM Short Term Goal #2 (0-30 days)  Patient will obtain blood pressure cuff to monitor blood pressure daily  in the next 14 days   [facility] CM Short Term Goal #2 Start Date  11/02/15   Interventions for Short Term Goal #2  RNCM will explore resources to secure a blood pressure monitor for patient      Patricia Draft, RN, [place] Management [phone]- Mobile [phone]- [place]

## 2015-11-06 ENCOUNTER — Other Ambulatory Visit: Payer: Self-pay | Admitting: *Deleted

## 2015-11-06 NOTE — Patient Outreach (Signed)
[facility]) Care Management  11/06/2015  SUZANN BORRELLO 1956/05/02 [id number]   Patient triggered RED on EMMI Heart Failure dashboard, notification sent to [name], RN.  Thanks, Ronnell [place]. [place], [place] Assistant Phone: [phone] Fax: [phone]

## 2015-11-06 NOTE — Patient Outreach (Addendum)
[facility]) Care Management  11/06/2015  Patricia Fischer February 17, 1956 [id number]  Late entry 11/05/15  RNCM spoke with patient regarding needed supplies of blood cuff for monitoring, and scales for daily weighing for managing her heart failure, patient states she still not have scales or blood pressure cuff states she cannot afford items.. Patient does not want to use [facility] care scale program at this time.   Plan Will provide blood pressure cuff and education, will make arrangements for scales to be delivered to patient home through [facility].  Plan home visit on Tuesday, march 7 at 2:30.  [name], RN, [place] Management [phone]- Mobile [phone]- Toll Free Main Office

## 2015-11-10 ENCOUNTER — Other Ambulatory Visit: Payer: Self-pay | Admitting: *Deleted

## 2015-11-10 ENCOUNTER — Ambulatory Visit: Payer: Self-pay | Admitting: *Deleted

## 2015-11-10 ENCOUNTER — Encounter: Payer: Self-pay | Admitting: *Deleted

## 2015-11-10 NOTE — Patient Outreach (Signed)
[facility]) Care Management   11/10/2015  Patricia Fischer Jul 13, 1956 [id number]  [name] is an 60 y.o. female  Subjective: Patient discussed she had a good day on yesterday, busy in home cooking and cleaning , complaint of being tired on today.  Objective:  BP 130/80 mmHg  Pulse 83  Resp 18  SpO2 97% Review of Systems  Constitutional: Negative.   HENT: Negative.   Eyes: Negative.   Respiratory: Negative.   Cardiovascular: Negative.  Negative for chest pain and leg swelling.  Gastrointestinal: Negative.   Genitourinary: Negative.   Musculoskeletal: Negative for falls.  Skin: Negative.   Neurological: Negative.  Negative for dizziness.  Endo/Heme/Allergies: Negative.   Psychiatric/Behavioral: Negative for depression. The patient is not nervous/anxious.     Physical Exam  Constitutional: She is oriented to person, place, and time. She appears well-developed and well-nourished.  Cardiovascular: Normal rate, regular rhythm, normal heart sounds and intact distal pulses.   Pulses:      Radial pulses are 2+ on the right side, and 2+ on the left side.       Dorsalis pedis pulses are 2+ on the right side, and 2+ on the left side.  Respiratory: Effort normal and breath sounds normal.  GI: Soft.  Musculoskeletal: Normal range of motion.  Neurological: She is alert and oriented to person, place, and time.  Skin: Skin is warm and dry.  Bilateral feet observed, skin intact , dry.  Psychiatric: She has a normal mood and affect. Her behavior is normal. Judgment and thought content normal.    Current Medications:   Current Outpatient Prescriptions  Medication Sig Dispense Refill  . amitriptyline (ELAVIL) 25 MG tablet Take 3 tablets (75 mg total) by mouth at bedtime. 90 tablet 3  . aspirin EC 81 MG tablet Take 324 mg by mouth once.    Marland Kitchen atorvastatin (LIPITOR) 20 MG tablet Take 1 tablet (20 mg total) by mouth daily at 6 PM. 30 tablet 11  . Blood Pressure  Monitoring (BLOOD PRESSURE CUFF) MISC 1 Device by Does not apply route once. 1 each 0  . carvedilol (COREG) 6.25 MG tablet Take 0.5 tablets by mouth 2 (two) times daily. Take 1/2 of tablet twice daily  5  . fluticasone (FLONASE) 50 MCG/ACT nasal spray Place 1 spray into both nostrils daily as needed for allergies or rhinitis.   11  . levalbuterol (XOPENEX HFA) 45 MCG/ACT inhaler Inhale 2 puffs into the lungs every 4 (four) hours as needed for wheezing.    . prochlorperazine (COMPAZINE) 10 MG tablet Take 1 tablet (10 mg total) by mouth 2 (two) times daily as needed (Headache). 20 tablet 0  . rivaroxaban (XARELTO) 20 MG TABS tablet Take 1 tablet (20 mg total) by mouth daily with supper. 30 tablet 0  . tiZANidine (ZANAFLEX) 2 MG tablet Take 1-2 tablets (2-4 mg total) by mouth at bedtime as needed for muscle spasms. 45 tablet 1  . albuterol (PROVENTIL HFA;VENTOLIN HFA) 108 (90 BASE) MCG/ACT inhaler Inhale 2 puffs into the lungs every 6 (six) hours as needed for shortness of breath. Reported on 11/10/2015    . HYDROcodone-acetaminophen (NORCO/VICODIN) 5-325 MG tablet Take 1 tablet by mouth every 6 (six) hours as needed for moderate pain. (Patient not taking: Reported on 11/02/2015) 40 tablet 0  . losartan (COZAAR) 50 MG tablet Take 25 mg by mouth daily. Reported on 11/10/2015  11  . nitroGLYCERIN (NITROSTAT) 0.4 MG SL tablet Place 0.4 mg under the  tongue every 5 (five) minutes as needed for chest pain. Reported on 11/10/2015    . ondansetron (ZOFRAN ODT) 4 MG disintegrating tablet Take 1 tablet (4 mg total) by mouth every 8 (eight) hours as needed for nausea or vomiting. (Patient not taking: Reported on 11/02/2015) 30 tablet 2   No current facility-administered medications for this visit.    Functional Status:   In your present state of health, do you have any difficulty performing the following activities: 11/10/2015 10/29/2015  Hearing? N N  Vision? N N  Difficulty concentrating or making decisions? N N   Walking or climbing stairs? Y N  Dressing or bathing? N N  Doing errands, shopping? Y N  Preparing Food and eating ? N -  Using the Toilet? N -  In the past six months, have you accidently leaked urine? N -  Do you have problems with loss of bowel control? N -  Managing your Medications? N -  Managing your Finances? N -  Housekeeping or managing your Housekeeping? N -   Fall Risk  11/10/2015 11/10/2015  Falls in the past year? Yes No  Number falls in past yr: 2 or more -  Injury with Fall? No -  Risk Factor Category  High Fall Risk -  Risk for fall due to : Other (Comment) -  Risk for fall due to (comments): history of passing out episodes per patient -  Follow up Falls prevention discussed -   Fall/Depression Screening:    PHQ 2/9 Scores 11/10/2015 08/04/2015  PHQ - 2 Score 1 3  PHQ- 9 Score - 13   Assessment:  Initial home visit. Patient home neat and orderly, no scatter rugs present. PatriciaFischer lives next door to her son, that provides transportation for shopping. PatriciaFischer discussed it being difficult not being able to drive herself due to recent episodes of passing out.  Heart Failure PatriciaFischer denies shortness of breath, no swelling.. Patient continues to decline using [place] program. Updated patient on estimated arrival date of scales, that are being provided by [facility] being mailed to her home. Educated patient on the importance of monitoring daily weights and recording. Reviewed heart failure zones with patient and importance of notifying MD of yellow zone symptoms as action plan, with weight gains greater than 3 pounds in a day or 5 pounds in a week, shortness of breath, swelling.  Hypotension episodes Patient denies dizziness able to do usual activities at home, patient states she walks daily outside at close around her home outside, tolerating without complaint of dizziness. Provided patient with automatic blood pressure cuff and educated by teachback  on proper use of monitor , patient able  to return demonstration without cues required.  Plan Patient will begin to monitor blood pressure on a daily basis and record and notify MD abnormal reading or symptoms . Patient will begin to weigh daily and record when scales arrive Will send initial note to MD Patient provided [facility] nondiscrimination and assessibility form with written education handouts RNCM will meet patient in home on 4/4 at 12 noon.  [facility] CM Care Plan Problem One        Most Recent Value   Care Plan Problem One  High Risk for hospital readmission   Role Documenting the Problem One  Care Management [place] for Problem One  Active   THN Long Term Goal (31-90 days)  Patient will not experience a hospital admission in the next 31 days  THN Long Term Goal Start Date  11/02/15   Interventions for Problem One Long Term Goal   Reviewed importance of  notifying MD sooner for new symptoms or concerns, taking medicaiton as prescribed.   THN CM Short Term Goal #1 (0-30 days)  Patient will attend PCP appointment in the next 14 days   THN CM Short Term Goal #1 Start Date  11/02/15   Interventions for Short Term Goal #1  Verified that patient has transportation, and date of pcp appointment   THN CM Short Term Goal #2 (0-30 days)  Patient will obtain blood pressure cuff to monitor blood pressure daily  in the next 14 days   THN CM Short Term Goal #2 Start Date  11/02/15   [facility] CM Short Term Goal #2 Met Date  11/10/15   THN CM Short Term Goal #3 (0-30 days)  Patient will demonstate proper use of blood pressure monitor,check and record blood pressure daily in the next 28 days    THN CM Short Term Goal #3 Start Date  11/10/15   Interventions for Short Tern Goal #3  Educated patient using teachback on how to use blood pressure monitor and how to record in [facility] calendar discussed to notify MD of symptoms    THN CM Short Term Goal #4 (0-30 days)  Patient will begin to weigh daily and  record in the next 30 days.   THN CM Short Term Goal #4 Start Date  11/10/15   Interventions for Short Term Goal #4  Educated patient on the importance of weighing daily and keeping a record , educated on how to record in [facility] calendar book,   THN CM Short Term Goal #5 (0-30 days)  Patient will be able to state at least 3 symptoms of heart failure yellow zone symptoms and state action plan in the next 30 days   THN CM Short Term Goal #5 Start Date  11/10/15   Interventions for Short Term Goal #5  Educated on the heart failure zones, provided heart failure packet  and reviewed ,  provide with heart failure zone magnet      [name], RN, [place] Management [phone]- Mobile [phone]- [place]

## 2015-11-11 ENCOUNTER — Inpatient Hospital Stay: Payer: Self-pay | Admitting: Family Medicine

## 2015-11-11 ENCOUNTER — Encounter: Payer: Self-pay | Admitting: Family Medicine

## 2015-11-18 ENCOUNTER — Other Ambulatory Visit: Payer: Self-pay | Admitting: *Deleted

## 2015-11-18 NOTE — Patient Outreach (Signed)
[facility]) Care Management  11/18/2015  Patricia Fischer [birth year] [id number]   Transition of care call- week #4 RNCM placed call to patient as part of the transition of care program, no answer, and unable to leave a message.  Plan Will attempt to contact patient on 3/16 for transition of care call.  [name], RN, [place] Management [phone]- Mobile [phone]- T[employer] [place] complies with applicable Federal civil rights laws and does not discriminate on the basis of race, color, national origin, age, disability, or sex. Espaol (Spanish)  [place] cumple con las leyes federales de derechos civiles aplicables y no discrimina por motivos de raza, color, nacionalidad, edad, discapacidad o sexo.  Ti?ng Vi?t ([place])  [place] tun th? lu?t dn quy?n hi?n hnh c?a Lin bang v khng phn bi?t ??i x? d?a trn ch?ng t?c, mu da, ngu?n g?c qu?c gia, ?? tu?i, khuy?t t?t, ho?c gi?i tnh.   (Arabic)   [place]                      .

## 2015-11-19 ENCOUNTER — Other Ambulatory Visit: Payer: Self-pay | Admitting: *Deleted

## 2015-11-19 NOTE — Patient Outreach (Signed)
[facility]) Care Management  11/19/2015  Patricia Fischer 02-15-1956 [id number]  Transition of care week #4 RNCM placed cal to patient as part of the transition of care program. [name] reports she has received her scales and has been weighing daily with no weight gain greater than 3 pounds in a day or 5 pounds in a week, she denies shortness of breath or swelling.  [name] states she is monitoring her blood pressure daily and recording blood pressures running 103-107/70, denies symptoms.  Patient states she doesn't feel like talking much today, the only reason she answered the phone was that she recognized my telephone number, she has had 2 deaths in her family in the last week. Patient agreeable to speaking with me again on next week or sooner if concerns .  Plan Patient will remain active with transition of care program, with next transition of care call on 3/23.  [name], RN, [place] Management [phone]- Mobile [phone]- Toll Free Main Office

## 2015-11-25 ENCOUNTER — Encounter: Payer: Self-pay | Admitting: Interventional Cardiology

## 2015-11-25 ENCOUNTER — Ambulatory Visit (INDEPENDENT_AMBULATORY_CARE_PROVIDER_SITE_OTHER): Payer: Medicare Other | Admitting: Interventional Cardiology

## 2015-11-25 VITALS — BP 120/90 | HR 87 | Ht 64.0 in | Wt 228.8 lb

## 2015-11-25 DIAGNOSIS — R0789 Other chest pain: Secondary | ICD-10-CM | POA: Diagnosis not present

## 2015-11-25 DIAGNOSIS — I10 Essential (primary) hypertension: Secondary | ICD-10-CM | POA: Diagnosis not present

## 2015-11-25 DIAGNOSIS — I5022 Chronic systolic (congestive) heart failure: Secondary | ICD-10-CM | POA: Diagnosis not present

## 2015-11-25 DIAGNOSIS — R55 Syncope and collapse: Secondary | ICD-10-CM | POA: Diagnosis not present

## 2015-11-25 MED ORDER — CARVEDILOL 6.25 MG PO TABS: 6.2500 mg | ORAL_TABLET | Freq: Two times a day (BID) | ORAL | Status: DC

## 2015-11-25 MED ORDER — SPIRONOLACTONE 25 MG PO TABS: 12.5000 mg | ORAL_TABLET | ORAL | Status: DC

## 2015-11-25 NOTE — Patient Instructions (Addendum)
Medication Instructions:  Your physician has recommended you make the following change in your medication:  1) INCREASE Carvedilol to 6.25mg  twice daily 2) In 2 weeks START Spironolactone 12.5mg  (1/2 tablet) every Mon,Wed,Fri   Labwork: Your physician recommends that you return for lab work in: 3 weeks ([place]) Please take the written order with you to [facility] to have your lab drawn   Testing/Procedures: None ordered  Follow-Up: You have a follow up appointment with Dr.Smith on 12/28/15 @ 10am  You have been referred to the [facility] ( please schedule their next available)  Any Other Special Instructions Will Be Listed Below (If Applicable).     If you need a refill on your cardiac medications before your next appointment, please call your pharmacy.

## 2015-11-25 NOTE — Progress Notes (Signed)
Cardiology Office Note   Date:  11/25/2015   ID:  Patricia Fischer, DOB Sep 16, 1955, MRN [id number]  PCP:  [name], MD  Cardiologist:  Sinclair Grooms, MD   Chief Complaint  Patient presents with  . Follow-up    CHF      History of Present Illness: Patricia Fischer is a 60 y.o. female who presents for Nonischemic cardiomyopathy, medication intolerance due to hypo-tension, history of bilateral pulmonary emboli, underlying history of essential hypertension pre-emboli, and anxiety disorder.  Doing relatively well. Has left ventricular ejection fraction in the 35% range. Denies orthopnea PND since discharge from the hospital. Cardiac catheterization during the most recent hospital stay reveal normal coronary arteries. LVEDP was 18 mm. No recurrence of chest pain/ pressure following diuresis.  Accompanied by daughter. Continues to do well. Concerned that she is not able tolerate medications and may help improve LV function.    Past Medical History  Diagnosis Date  . Essential hypertension   . Asthma   . Environmental allergies   . Pneumonia 2012  . Blood transfusion [place]  . GERD (gastroesophageal reflux disease)   . Depression   . Breast cancer, left breast ([place]) 2011    DCIS  . Head injury 2011  . Anxiety   . Hemorrhoids   . Obesity   . Chronic migraine without aura, with intractable migraine, so stated, with status migrainosus March 2016  . Chronic systolic CHF (congestive heart failure) ([place]) 05/05/2015    a. Dx 04/2015 - EF initially 25-30% with RV dysfunction, f/u echo 08/2015 technically difficult, EF 35-40%, anterior, anteroseptal, apical and inferoapical severe hypokinesis suggestive of LAD territory ischemia/infarct, grade 1 DD, mild MR, RV normal.  . Syncope     a. Recurrent syncope in 2016 felt 2/2 orthostasis in setting of PE.  . Orthostatic hypotension   . Saddle pulmonary embolus ([place]) Aug. 2016    a. Dx 04/2015 - patient returned  05/2015 after noncompliance with Xarelto.  Marland Kitchen DVT (deep venous thrombosis) ([place]) Aug. 2016    a. Dx 04/2015 - patient returned 05/2015 after noncompliance with Xarelto.  Marland Kitchen LBBB (left bundle branch block)   . Family history of adverse reaction to anesthesia     " My Mother would get deathly sick "  . Shortness of breath dyspnea     Past Surgical History  Procedure Laterality Date  . Breast surgery Left 2011    Breast lumpectomy  . Cardiac catheterization  2010    [facility]; pt states it was "clear"  . Tonsillectomy    . Appendectomy    . Cholecystectomy    . Tubal ligation    . Anal fissure repair    . Knee cartilage surgery Left   . Carpal tunnel release Bilateral   . Tarsal tunnel release Left 1990  . Anterior cervical decomp/discectomy fusion  01/23/2012    Procedure: ANTERIOR CERVICAL DECOMPRESSION/DISCECTOMY FUSION 1 LEVEL;  Surgeon: Elaina Hoops, MD;  Location: [place] NEURO ORS;  Service: Neurosurgery;  Laterality: N/A;  Cervical five-six anterior cervical discectomy fusion with plating  . Cesarean section  1982, 1979  . Abdominal hysterectomy  1986  . Colonoscopy  01/16/2009    Dr Bary Castilla  . Shoulder arthroscopy with subacromial decompression, rotator cuff repair and bicep tendon repair Right 11/22/2013    Procedure: RIGHT SHOULDER ATHROSCOPY OPEN SUBSCAP REPAIR DELTA-PECTORAL ;  Surgeon: Augustin Schooling, MD;  Location: [place];  Service: Orthopedics;  Laterality:  Right;  . Cardiac catheterization N/A 10/28/2015    Procedure: Right/Left Heart Cath and Coronary Angiography;  Surgeon: Belva Crome, MD;  Location: [place] CV LAB;  Service: Cardiovascular;  Laterality: N/A;     Current Outpatient Prescriptions  Medication Sig Dispense Refill  . albuterol (PROVENTIL HFA;VENTOLIN HFA) 108 (90 BASE) MCG/ACT inhaler Inhale 2 puffs into the lungs every 6 (six) hours as needed for shortness of breath. Reported on 11/10/2015    . amitriptyline (ELAVIL) 25 MG tablet Take 3 tablets (75 mg  total) by mouth at bedtime. 90 tablet 3  . aspirin EC 81 MG tablet Take 324 mg by mouth once.    Marland Kitchen atorvastatin (LIPITOR) 20 MG tablet Take 1 tablet (20 mg total) by mouth daily at 6 PM. 30 tablet 11  . carvedilol (COREG) 6.25 MG tablet Take 0.5 tablets by mouth 2 (two) times daily. Take 1/2 of tablet twice daily  5  . fluticasone (FLONASE) 50 MCG/ACT nasal spray Place 1 spray into both nostrils daily as needed for allergies or rhinitis.   11  . levalbuterol (XOPENEX HFA) 45 MCG/ACT inhaler Inhale 2 puffs into the lungs every 4 (four) hours as needed for wheezing.    Marland Kitchen losartan (COZAAR) 50 MG tablet Take 25 mg by mouth daily. Reported on 11/10/2015  11  . nitroGLYCERIN (NITROSTAT) 0.4 MG SL tablet Place 0.4 mg under the tongue every 5 (five) minutes as needed for chest pain. Reported on 11/10/2015    . ondansetron (ZOFRAN ODT) 4 MG disintegrating tablet Take 1 tablet (4 mg total) by mouth every 8 (eight) hours as needed for nausea or vomiting. 30 tablet 2  . prochlorperazine (COMPAZINE) 10 MG tablet Take 1 tablet (10 mg total) by mouth 2 (two) times daily as needed (Headache). 20 tablet 0  . rivaroxaban (XARELTO) 20 MG TABS tablet Take 1 tablet (20 mg total) by mouth daily with supper. 30 tablet 0  . tiZANidine (ZANAFLEX) 2 MG tablet Take 1-2 tablets (2-4 mg total) by mouth at bedtime as needed for muscle spasms. 45 tablet 1   No current facility-administered medications for this visit.    Allergies:   Gadolinium derivatives; Sulfa antibiotics; Aripiprazole; Codeine; Oxycodone; and Penicillins    Social History:  The patient  reports that she has never smoked. She has never used smokeless tobacco. She reports that she does not drink alcohol or use illicit drugs.   Family History:  The patient's family history includes Aneurysm in her paternal grandfather; Breast cancer in her mother and paternal aunt; COPD in her father and paternal grandfather; Cancer in her father and mother; Heart attack in her  paternal grandmother; Heart disease in her paternal grandmother; Hypertension in her mother; Migraines in her mother; Rectal cancer in her father; Stroke in her paternal grandmother. There is no history of Anesthesia problems or Diabetes.    ROS:  Please see the history of present illness.   Otherwise, review of systems are positive for Recurring headaches, episodes of dizziness, no recurrence of syncope. Still abstaining from driving.   All other systems are reviewed and negative.    PHYSICAL EXAM: VS:  BP 120/90 mmHg  Pulse 87  Ht 5\' 4"  (1.626 m)  Wt 228 lb 12.8 oz (103.783 kg)  BMI 39.25 kg/m2  SpO2 96% , BMI Body mass index is 39.25 kg/(m^2). GEN: Well nourished, well developed, in no acute distress HEENT: normal Neck: no JVD, carotid bruits, or masses Cardiac: RRR.  There is no murmur,  rub, or gallop. There is no edema. Respiratory:  clear to auscultation bilaterally, normal work of breathing. GI: soft, nontender, nondistended, + BS MS: no deformity or atrophy Skin: warm and dry, no rash Neuro:  Strength and sensation are intact Psych: euthymic mood, full affect   EKG:  EKG is not ordered today.    Recent Labs: 05/05/2015: Magnesium 2.2 10/27/2015: ALT 15; B Natriuretic Peptide 26.3; TSH 1.095 10/28/2015: BUN 14; Creatinine, Ser 1.15*; Potassium 4.3; Sodium 135 10/31/2015: Hemoglobin 11.2*; Platelets 163    Lipid Panel    Component Value Date/Time   CHOL 171 09/18/2015 1515   TRIG 123 09/18/2015 1515   HDL 51 09/18/2015 1515   CHOLHDL 3.4 09/18/2015 1515   VLDL 25 09/18/2015 1515   LDLCALC 95 09/18/2015 1515      Wt Readings from Last 3 Encounters:  11/25/15 228 lb 12.8 oz (103.783 kg)  10/31/15 226 lb 3.2 oz (102.604 kg)  10/27/15 223 lb (101.152 kg)      Other studies Reviewed: Additional studies/ records that were reviewed today include: Reviewed cath report. The findings include developed significant hypotension after one dose of Entresto. LVEDP was 18  mmHg when the medication was tried..    ASSESSMENT AND PLAN:  1. Essential hypertension Hypotension on medical therapy. Has not been able to tolerate even the lowest doses of angiotensin/ARB therapy. 25 mg of losartan was associated with hypotension. Entresto 24/26 mg daily was not tolerated and associated with hypotension after one dose.  2. Other chest pain Chest pain is nonischemic. Recent hospitalization demonstrated normal coronaries by angiography.  3. Chronic systolic heart failure (HCC) Related to nonischemic cardiomyopathy.  4. Syncope, unspecified syncope type Recurrent syncope related to saddle pulmonary emboli initially and subsequently heart failure therapy  5. History of left bundle branch block QRS duration is less than 120 ms.   Current medicines are reviewed at length with the patient today.  The patient has the following concerns regarding medicines: .  The following changes/actions have been instituted:    Increase carvedilol to 6.25 mg twice a day  Aldactone 12.5 mg on Monday Wednesday and Friday. This was started 2 weeks after the increasing carvedilol. He will be started only if she is able tolerate the increase carvedilol dose.  Basic metabolic panel in 3 weeks  Heart failure clinic evaluation.  I consider referral to device clinic but I do not believe resynchronization therapy will improve LV function given the short QRS duration. If I can ever get her on heart failure therapy of significant magnitude, persistent reduction LVEF may lead to defibrillator.  Labs/ tests ordered today include:  No orders of the defined types were placed in this encounter.     Disposition:   FU with HS in 1 month  Signed, Sinclair Grooms, MD  11/25/2015 9:22 AM    [facility] [address], [place], [place]  [address] Phone: [phone]; Fax: [phone]

## 2015-11-26 ENCOUNTER — Other Ambulatory Visit: Payer: Self-pay | Admitting: *Deleted

## 2015-11-26 NOTE — Patient Outreach (Signed)
[facility]) Care Management  11/26/2015  DARIANNY FRANGIPANE [birth year] [id number]  Transition of care week #5  Scheduled telephone call to Mrs.Loubier as part of the transition of care program. No answer, I left a HIPAA compliant message with my return telephone call number.  Plan Await return call , if no response will attempt to contact patient on 3/24.  [name], RN, [place] Management [phone]- Mobile [phone]- Toll Free Main Office

## 2015-11-26 NOTE — Patient Outreach (Signed)
[facility]) Care Management  11/26/2015  Patricia Fischer 1955/12/10 [id number]   Transition of care call week #5  RNCM placed call to patient as part of transition of care program, spoke with [name], she is very hoarse, occasional cough, denies fever, just aching all over denies shortness of breath or dizziness. Patient reports that she did not feel like this at her visit with cardiology on yesterday, patient thinks its allergies or brought on by stress related to 2 recent death and funeral in the family. Discussed with patient importance of following up with her PCP, and states she will call them for an appointment in the am if she does not feel better. Patient briefly discussed her visit with Dr.Smith on yesterday and medication changes and Heart failure clinic plans. Patricia Fischer reported she started her new dose of coreg today, blood pressure 107/62 no dizziness.  Plan Patient will notify PCP of current symptoms and 911 for emergencies  Patient will continue to monitor blood pressures and notify MD of lower blood pressures or symptoms. RNCM will meet patient at home for visit on 4/4  Patricia Fischer, [place], [place] Management [phone]- Mobile [phone]- [place] .

## 2015-12-07 NOTE — Telephone Encounter (Signed)
error 

## 2015-12-08 ENCOUNTER — Ambulatory Visit: Payer: Self-pay | Admitting: *Deleted

## 2015-12-08 ENCOUNTER — Other Ambulatory Visit: Payer: Self-pay | Admitting: *Deleted

## 2015-12-08 NOTE — Patient Outreach (Signed)
[facility]) Care Management  12/08/2015  Patricia Fischer 24-Oct-1955 [id number]   RNCM placed call to patient prior to arrival to her home for scheduled home visit, [name] states she is not at  home, due to family emergency, unable to talk further.  Plan Will follow up with patient in a week, regarding plan for follow up.  [name], RN, [place] Management [phone]- Mobile [phone]- Toll Free Main Office

## 2015-12-10 ENCOUNTER — Other Ambulatory Visit: Payer: Self-pay | Admitting: *Deleted

## 2015-12-10 MED ORDER — AMITRIPTYLINE HCL 25 MG PO TABS: 75.0000 mg | ORAL_TABLET | Freq: Every day | ORAL | Status: DC

## 2015-12-11 ENCOUNTER — Ambulatory Visit (HOSPITAL_COMMUNITY)
Admission: RE | Admit: 2015-12-11 | Discharge: 2015-12-11 | Disposition: A | Discharge status: Home / Self Care | Payer: Medicare Other | Source: Ambulatory Visit | Attending: Cardiology | Admitting: Cardiology

## 2015-12-11 VITALS — BP 132/96 | HR 78 | Wt 230.1 lb

## 2015-12-11 DIAGNOSIS — J45909 Unspecified asthma, uncomplicated: Secondary | ICD-10-CM | POA: Diagnosis not present

## 2015-12-11 DIAGNOSIS — Z825 Family history of asthma and other chronic lower respiratory diseases: Secondary | ICD-10-CM | POA: Diagnosis not present

## 2015-12-11 DIAGNOSIS — Z7982 Long term (current) use of aspirin: Secondary | ICD-10-CM | POA: Diagnosis not present

## 2015-12-11 DIAGNOSIS — I110 Hypertensive heart disease with heart failure: Secondary | ICD-10-CM | POA: Diagnosis not present

## 2015-12-11 DIAGNOSIS — I428 Other cardiomyopathies: Secondary | ICD-10-CM | POA: Diagnosis not present

## 2015-12-11 DIAGNOSIS — I5022 Chronic systolic (congestive) heart failure: Secondary | ICD-10-CM | POA: Diagnosis not present

## 2015-12-11 DIAGNOSIS — Z7901 Long term (current) use of anticoagulants: Secondary | ICD-10-CM | POA: Insufficient documentation

## 2015-12-11 DIAGNOSIS — Z86711 Personal history of pulmonary embolism: Secondary | ICD-10-CM | POA: Diagnosis not present

## 2015-12-11 DIAGNOSIS — F329 Major depressive disorder, single episode, unspecified: Secondary | ICD-10-CM | POA: Insufficient documentation

## 2015-12-11 DIAGNOSIS — R5383 Other fatigue: Secondary | ICD-10-CM | POA: Insufficient documentation

## 2015-12-11 DIAGNOSIS — Z882 Allergy status to sulfonamides status: Secondary | ICD-10-CM | POA: Diagnosis not present

## 2015-12-11 DIAGNOSIS — K219 Gastro-esophageal reflux disease without esophagitis: Secondary | ICD-10-CM | POA: Insufficient documentation

## 2015-12-11 DIAGNOSIS — R0683 Snoring: Secondary | ICD-10-CM

## 2015-12-11 DIAGNOSIS — Z86718 Personal history of other venous thrombosis and embolism: Secondary | ICD-10-CM | POA: Diagnosis not present

## 2015-12-11 DIAGNOSIS — R0602 Shortness of breath: Secondary | ICD-10-CM | POA: Diagnosis not present

## 2015-12-11 DIAGNOSIS — Z800 Family history of malignant neoplasm of digestive organs: Secondary | ICD-10-CM | POA: Insufficient documentation

## 2015-12-11 DIAGNOSIS — Z8249 Family history of ischemic heart disease and other diseases of the circulatory system: Secondary | ICD-10-CM | POA: Insufficient documentation

## 2015-12-11 DIAGNOSIS — Z79899 Other long term (current) drug therapy: Secondary | ICD-10-CM | POA: Diagnosis not present

## 2015-12-11 DIAGNOSIS — Z803 Family history of malignant neoplasm of breast: Secondary | ICD-10-CM | POA: Insufficient documentation

## 2015-12-11 DIAGNOSIS — I447 Left bundle-branch block, unspecified: Secondary | ICD-10-CM

## 2015-12-11 DIAGNOSIS — E669 Obesity, unspecified: Secondary | ICD-10-CM | POA: Insufficient documentation

## 2015-12-11 DIAGNOSIS — F419 Anxiety disorder, unspecified: Secondary | ICD-10-CM | POA: Insufficient documentation

## 2015-12-11 DIAGNOSIS — Z87898 Personal history of other specified conditions: Secondary | ICD-10-CM | POA: Insufficient documentation

## 2015-12-11 DIAGNOSIS — Z885 Allergy status to narcotic agent status: Secondary | ICD-10-CM | POA: Insufficient documentation

## 2015-12-11 DIAGNOSIS — Z880 Allergy status to penicillin: Secondary | ICD-10-CM | POA: Insufficient documentation

## 2015-12-11 NOTE — Progress Notes (Signed)
Patient ID: Patricia Fischer, female   DOB: 11-Aug-1956, 60 y.o.   MRN: [id number]   ADVANCED HF CLINIC CONSULT NOTE  Referring Physician: Dr Joanna Hews Primary Care: Dr Steva Ready Primary Cardiologist: Dr Tamala Julian  EP: Dr Baird Kay   HPI: Patricia Fischer is a 60 y.o. female who presents for NICM, medication intolerance due to hypo-tension, history of bilateral pulmonary emboli, underlying history of essential hypertension pre-emboli, syncope, and anxiety disorder referred to the HF clinic by Dr Tamala Julian due to difficulty with HF meds.   Admitted in February 2017 with increase dyspnea and hypotension. Diuresed with IV lasix. Had RHC/LHC with normal coronaries. Started on entresto after RHC. Discharge weight 226 pounds.   Today she present for HF consultation. She has been intolerant losartan and entresto due to hypotension.  SBP 88-118. Weight at home 222-227 pounds. Says she has gained about 25 pounds in the last 6 months. Says she has abdominal bloating. SOB with activity. +Orthopnea. Snores. + PND. No bleeding problems. Taking all medications. She does not drive due to syncopal episode.   LHC/RHC 10/28/2015  Coronaries ok RA 19 PA 35/23 (28) PCWP 23 CO/CI  4.8/2.35  PVR 1.0   ECHO 08/24/2016 EF 35-40%  Grade I DD RV ok   US Carotid  Ok 10/07/2015   Review of Systems: [y] = yes, [ ]  = no   General: Weight gain [Y ]; Weight loss [ ] ; Anorexia [ ] ; Fatigue [ Y]; Fever [ ] ; Chills [ ] ; Weakness [Y ]  Cardiac: Chest pain/pressure [ ] ; Resting SOB [ Y]; Exertional SOB [Y ]; Orthopnea [Y ]; Pedal Edema [ ] ; Palpitations [ ] ; Syncope [ ] ; Presyncope [ ] ; Paroxysmal nocturnal dyspnea[ ]   Pulmonary: Cough Jazmín.[place] ]; Wheezing[ ] ; Hemoptysis[ ] ; Sputum [ ] ; Snoring [ ]   GI: Vomiting[ ] ; Dysphagia[ ] ; Melena[ ] ; Hematochezia [ ] ; Heartburn[ ] ; Abdominal pain [ ] ; Constipation [ ] ; Diarrhea [ ] ; BRBPR [ ]   GU: Hematuria[ ] ; Dysuria [ ] ; Nocturia[ ]   Vascular: Pain in legs with walking [ ] ; Pain in feet with lying  flat [ ] ; Non-healing sores [ ] ; Stroke [ ] ; TIA [ ] ; Slurred speech [ ] ;  Neuro: Headaches[ ] ; Vertigo[ ] ; Seizures[ ] ; Paresthesias[ ] ;Blurred vision [ ] ; Diplopia [ ] ; Vision changes [ ]   Ortho/Skin: Arthritis [ ] ; Joint pain [ Y]; Muscle pain [ ] ; Joint swelling [ ] ; Back Pain [ ] ; Rash [ ]   Psych: Depression[ ] ; Anxiety[ ]   Heme: Bleeding problems [ ] ; Clotting disorders [ ] ; Anemia [ ]   Endocrine: Diabetes [ ] ; Thyroid dysfunction[ ]    Past Medical History  Diagnosis Date  . Essential hypertension   . Asthma   . Environmental allergies   . Pneumonia 2012  . Blood transfusion [place]  . GERD (gastroesophageal reflux disease)   . Depression   . Breast cancer, left breast ([place]) 2011    DCIS  . Head injury 2011  . Anxiety   . Hemorrhoids   . Obesity   . Chronic migraine without aura, with intractable migraine, so stated, with status migrainosus March 2016  . Chronic systolic CHF (congestive heart failure) ([place]) 05/05/2015    a. Dx 04/2015 - EF initially 25-30% with RV dysfunction, f/u echo 08/2015 technically difficult, EF 35-40%, anterior, anteroseptal, apical and inferoapical severe hypokinesis suggestive of LAD territory ischemia/infarct, grade 1 DD, mild MR, RV normal.  . Syncope     a. Recurrent syncope in 2016 felt 2/2  orthostasis in setting of PE.  . Orthostatic hypotension   . Saddle pulmonary embolus ([place]) Aug. 2016    a. Dx 04/2015 - patient returned 05/2015 after noncompliance with Xarelto.  Marland Kitchen DVT (deep venous thrombosis) ([place]) Aug. 2016    a. Dx 04/2015 - patient returned 05/2015 after noncompliance with Xarelto.  Marland Kitchen LBBB (left bundle branch block)   . Family history of adverse reaction to anesthesia     " My Mother would get deathly sick "  . Shortness of breath dyspnea     Current Outpatient Prescriptions  Medication Sig Dispense Refill  . albuterol (PROVENTIL HFA;VENTOLIN HFA) 108 (90 BASE) MCG/ACT inhaler Inhale 2 puffs into the lungs every 6 (six) hours  as needed for shortness of breath. Reported on 11/10/2015    . amitriptyline (ELAVIL) 25 MG tablet Take 3 tablets (75 mg total) by mouth at bedtime. 90 tablet 2  . aspirin EC 81 MG tablet Take 324 mg by mouth once.    Marland Kitchen atorvastatin (LIPITOR) 20 MG tablet Take 1 tablet (20 mg total) by mouth daily at 6 PM. 30 tablet 11  . carvedilol (COREG) 6.25 MG tablet Take 1 tablet (6.25 mg total) by mouth 2 (two) times daily. Take 1/2 of tablet twice daily 60 tablet 5  . fluticasone (FLONASE) 50 MCG/ACT nasal spray Place 1 spray into both nostrils daily as needed for allergies or rhinitis.   11  . levalbuterol (XOPENEX HFA) 45 MCG/ACT inhaler Inhale 2 puffs into the lungs every 4 (four) hours as needed for wheezing.    Marland Kitchen losartan (COZAAR) 50 MG tablet Take 25 mg by mouth daily. Reported on 11/10/2015  11  . nitroGLYCERIN (NITROSTAT) 0.4 MG SL tablet Place 0.4 mg under the tongue every 5 (five) minutes as needed for chest pain. Reported on 11/10/2015    . ondansetron (ZOFRAN ODT) 4 MG disintegrating tablet Take 1 tablet (4 mg total) by mouth every 8 (eight) hours as needed for nausea or vomiting. 30 tablet 2  . prochlorperazine (COMPAZINE) 10 MG tablet Take 1 tablet (10 mg total) by mouth 2 (two) times daily as needed (Headache). 20 tablet 0  . rivaroxaban (XARELTO) 20 MG TABS tablet Take 1 tablet (20 mg total) by mouth daily with supper. 30 tablet 0  . spironolactone (ALDACTONE) 25 MG tablet Take 0.5 tablets (12.5 mg total) by mouth every Monday, Wednesday, and Friday. 10 tablet 5  . tiZANidine (ZANAFLEX) 2 MG tablet Take 1-2 tablets (2-4 mg total) by mouth at bedtime as needed for muscle spasms. 45 tablet 1   No current facility-administered medications for this encounter.    Allergies  Allergen Reactions  . Gadolinium Derivatives Shortness Of Breath and Nausea And Vomiting  . Sulfa Antibiotics Swelling    "Ears swelled up like Dumbo"  . Aripiprazole     Dry mouth with sores   . Codeine Itching and Rash  .  Oxycodone Itching  . Penicillins Hives    Has patient had a PCN reaction causing immediate rash, facial/tongue/throat swelling, SOB or lightheadedness with hypotension: Yes Has patient had a PCN reaction causing severe rash involving mucus membranes or skin necrosis: No Has patient had a PCN reaction that required hospitalization No Has patient had a PCN reaction occurring within the last 10 years: No If all of the above answers are "NO", then may proceed with Cephalosporin use.      Social History   Social History  . Marital Status: Divorced    Spouse Name:  N/A  . Number of Children: 2  . Years of Education: college-1   Occupational History  . Unemployed    Social History Main Topics  . Smoking status: Never Smoker   . Smokeless tobacco: Never Used  . Alcohol Use: No     Comment: occasional wine  . Drug Use: No  . Sexual Activity: No   Other Topics Concern  . Not on file   Social History Narrative      Family History  Problem Relation Age of Onset  . Anesthesia problems Neg Hx   . Diabetes Neg Hx   . Breast cancer Paternal Aunt   . Breast cancer Mother   . Migraines Mother   . Cancer Mother   . Hypertension Mother   . Rectal cancer Father   . Cancer Father   . COPD Father   . Heart disease Paternal Grandmother   . Stroke Paternal Grandmother   . Heart attack Paternal Grandmother   . Aneurysm Paternal Grandfather   . COPD Paternal Grandfather     Danley Danker Vitals:   12/11/15 1155  BP: 132/96  Pulse: 78  Weight: 230 lb 1.6 oz (104.373 kg)  SpO2: 96%    PHYSICAL EXAM: General:  Well appearing. No respiratory difficulty. Daughter present  HEENT: normal Neck: supple. JVP does not appear elevated. Hard to assess due to body habitus. Carotids 2+ bilat; no bruits. No lymphadenopathy or thryomegaly appreciated. Cor: PMI nondisplaced. Regular rate & rhythm. No rubs, gallops or murmurs. Lungs: clear Abdomen: obese, soft, nontender, nondistended. No  hepatosplenomegaly. No bruits or masses. Good bowel sounds. Extremities: no cyanosis, clubbing, rash, edema Neuro: alert & oriented x 3, cranial nerves grossly intact. moves all 4 extremities w/o difficulty. Affect pleasant.  ASSESSMENT & PLAN:  1. Chonic Systolic Heart Failure with evidence of R heart failure noted on hemodynamics- NICM LHC norm ECHO 2017- EF 35-40%. No evidence of RV failure.  Normal cors 10/2015.  Reds Vest Reading- 33% NYHA III. Volume status stable.  Continue carvedilol 3.125 mg twice a day  For now continue 12.5 mg spiro daily.   Intolerant Entresto and Losartan due to hypotension.  2. H/O Bilateral PE- on Xarelto- Check VQ scan to assess for residual clots.  3.  Day Time Fatigue- Refer to pulmonary for sleep study.  4. H/O Syncope-2015-2016 - She has seen neurology  5. Obese- Needs to lose weight. Discussed portion control and bariatric options.   Follow up 6 weeks with Dr Haroldine Laws   Darrick Grinder NP-C  1:10 PM  Patient seen and examined with Darrick Grinder, NP. We discussed all aspects of the encounter. I agree with the assessment and plan as stated above.   Echo, R/L heart cath reviewed personally. Difficult case she has NICM with EF 35-40%. Volume status ok on exam and by REDS. On low dose b-blocker but unable to tolerate RAAS inhibition due to hypotension. That said I think main determinant of DOE are her obesity and probable severe OSA. We alked her around clinic today and sats dropped to 84%. Will start home O2. Refer for sleep study. Long tal about various forms of bariatric surgery. Wil check VQ to exclude chronic PE for completemess sake.   Total time spent 45 minutes. Over half that time spent discussing above.    Mahli Glahn,MD 10:48 PM

## 2015-12-11 NOTE — Patient Instructions (Signed)
Will schedule you for VQ scan and Pulmonary Function Test at [facility].  Will refer you to pulmonary for possible sleep study.  Will refer you to [facility] for home oxygen.  Follow up with Dr. Haroldine Laws in 6 weeks.  Do the following things EVERYDAY: 1) Weigh yourself in the morning before breakfast. Write it down and keep it in a log. 2) Take your medicines as prescribed 3) Eat low salt foods-Limit salt (sodium) to 2000 mg per day.  4) Stay as active as you can everyday 5) Limit all fluids for the day to less than 2 liters

## 2015-12-11 NOTE — Progress Notes (Signed)
SATURATION QUALIFICATIONS: (This note is used to comply with regulatory documentation for home oxygen)  Patient Saturations on Room Air at Rest = 97%  Patient Saturations on Room Air while Ambulating = 84%  Patient Saturations on 2 Liters of oxygen while Ambulating = 96%  Please briefly explain why patient needs home oxygen: Patient quickly desats to low-mid 80% and becomes SOB with ambulation/minimal exertion on room air.

## 2015-12-14 ENCOUNTER — Other Ambulatory Visit: Payer: Self-pay | Admitting: *Deleted

## 2015-12-14 ENCOUNTER — Telehealth (HOSPITAL_COMMUNITY): Payer: Self-pay | Admitting: Vascular Surgery

## 2015-12-14 DIAGNOSIS — I509 Heart failure, unspecified: Secondary | ICD-10-CM | POA: Diagnosis not present

## 2015-12-14 NOTE — Patient Outreach (Signed)
[facility]) Care Management  12/14/2015  Patricia Fischer Oct 21, 1955 [id number]   Telephone assessment  Telephone call placed to patient to reschedule home visit from last week that patient was unable to attend, HIPAA compliant message left with my contact information.  Plan Await return call, if no response will attempt contact patient in the next 3 days.   [name], RN, [facility] [phone]- Mobile [phone]- Toll Free Main Office

## 2015-12-14 NOTE — Telephone Encounter (Signed)
Left pt detailed message .Marland Kitchen appt pft/ VQ scan

## 2015-12-18 ENCOUNTER — Other Ambulatory Visit: Payer: Self-pay | Admitting: *Deleted

## 2015-12-18 NOTE — Patient Outreach (Signed)
[facility]) Care Management  12/18/2015  Patricia Fischer [birth year] [id number]   Unsuccessful attempt to contact patient to reschedule visit, I left a HIPAA voice mail message,with contact information and date of my next attempt to contact her.  Plan Will attempt to contact patient the week of April 24, to reschedule follow up visit.  [name], RN, [facility] [phone]- Mobile [phone]- Toll Free Main Office

## 2015-12-22 ENCOUNTER — Ambulatory Visit
Admission: RE | Admit: 2015-12-22 | Discharge: 2015-12-22 | Disposition: A | Discharge status: Home / Self Care | Payer: Medicare Other | Source: Ambulatory Visit | Attending: Internal Medicine | Admitting: Internal Medicine

## 2015-12-22 ENCOUNTER — Ambulatory Visit (HOSPITAL_COMMUNITY): Payer: Medicare Other

## 2015-12-22 ENCOUNTER — Other Ambulatory Visit
Admission: RE | Admit: 2015-12-22 | Discharge: 2015-12-22 | Disposition: A | Discharge status: Home / Self Care | Payer: Medicare Other | Source: Ambulatory Visit | Attending: Interventional Cardiology | Admitting: Interventional Cardiology

## 2015-12-22 DIAGNOSIS — R0602 Shortness of breath: Secondary | ICD-10-CM

## 2015-12-22 DIAGNOSIS — J986 Disorders of diaphragm: Secondary | ICD-10-CM | POA: Diagnosis not present

## 2015-12-22 DIAGNOSIS — I5022 Chronic systolic (congestive) heart failure: Secondary | ICD-10-CM | POA: Insufficient documentation

## 2015-12-22 DIAGNOSIS — I669 Occlusion and stenosis of unspecified cerebral artery: Secondary | ICD-10-CM | POA: Diagnosis not present

## 2015-12-22 LAB — BASIC METABOLIC PANEL
ANION GAP: 5 (ref 5–15)
BUN: 13 mg/dL (ref 6–20)
CHLORIDE: 106 mmol/L (ref 101–111)
CO2: 27 mmol/L (ref 22–32)
Calcium: 9.2 mg/dL (ref 8.9–10.3)
Creatinine, Ser: 0.76 mg/dL (ref 0.44–1.00)
GFR calc non Af Amer: >60 mL/min (ref >60)
GLUCOSE: 108 mg/dL — AB (ref 65–99)
POTASSIUM: 3.7 mmol/L (ref 3.5–5.1)
Sodium: 138 mmol/L (ref 135–145)

## 2015-12-22 MED ORDER — TECHNETIUM TC 99M DIETHYLENETRIAME-PENTAACETIC ACID
32.5700 | Freq: Once | INTRAVENOUS | Status: AC | PRN
  Administered 2015-12-22: 32.57 via RESPIRATORY_TRACT

## 2015-12-22 MED ORDER — TECHNETIUM TO 99M ALBUMIN AGGREGATED
2.5400 | Freq: Once | INTRAVENOUS | Status: AC | PRN
  Administered 2015-12-22: 2.54 via INTRAVENOUS

## 2015-12-28 ENCOUNTER — Telehealth: Payer: Self-pay

## 2015-12-28 ENCOUNTER — Ambulatory Visit (INDEPENDENT_AMBULATORY_CARE_PROVIDER_SITE_OTHER): Payer: Medicare Other | Admitting: Interventional Cardiology

## 2015-12-28 ENCOUNTER — Other Ambulatory Visit: Payer: Self-pay | Admitting: *Deleted

## 2015-12-28 ENCOUNTER — Encounter: Payer: Self-pay | Admitting: Interventional Cardiology

## 2015-12-28 VITALS — BP 118/82 | HR 73 | Ht 64.0 in | Wt 223.8 lb

## 2015-12-28 DIAGNOSIS — I447 Left bundle-branch block, unspecified: Secondary | ICD-10-CM

## 2015-12-28 DIAGNOSIS — R55 Syncope and collapse: Secondary | ICD-10-CM

## 2015-12-28 DIAGNOSIS — I10 Essential (primary) hypertension: Secondary | ICD-10-CM | POA: Diagnosis not present

## 2015-12-28 DIAGNOSIS — R0683 Snoring: Secondary | ICD-10-CM

## 2015-12-28 DIAGNOSIS — I5022 Chronic systolic (congestive) heart failure: Secondary | ICD-10-CM | POA: Diagnosis not present

## 2015-12-28 NOTE — Patient Outreach (Signed)
[facility]) Care Management  12/28/2015  OVELIA STOY [birth year] [id number]  RNCM placed call to patient to follow up regarding rescheduling next visit, no answer, left a message with my return call back number.  Plan Will await return call, if no response will attempt call back in 3 days.  [name], RN, [facility] [phone]- Mobile [phone]- Toll Free Main Office

## 2015-12-28 NOTE — Patient Instructions (Signed)
Medication Instructions:  Your physician recommends that you continue on your current medications as directed. Please refer to the Current Medication list given to you today.   Labwork: None ordered  Testing/Procedures: Your physician has recommended that you have a sleep study. This test records several body functions during sleep, including: brain activity, eye movement, oxygen and carbon dioxide blood levels, heart rate and rhythm, breathing rate and rhythm, the flow of air through your mouth and nose, snoring, body muscle movements, and chest and belly movement. Patricia Fischer will call you to schedule)  Follow-Up: Your physician wants you to follow-up in: 4 months with Dr.Smith You will receive a reminder letter in the mail two months in advance. If you don't receive a letter, please call our office to schedule the follow-up appointment.   Any Other Special Instructions Will Be Listed Below (If Applicable). Your physician discussed the importance of regular exercise and recommended that you start or continue a regular exercise program for good health.  It is ok to resume driving     If you need a refill on your cardiac medications before your next appointment, please call your pharmacy.

## 2015-12-28 NOTE — Telephone Encounter (Signed)
Left message to call back to discuss sleep study

## 2015-12-28 NOTE — Progress Notes (Signed)
Cardiology Office Note   Date:  12/28/2015   ID:  [name], DOB [birth year], MRN [id number]  PCP:  [name], MD  Cardiologist:  Sinclair Grooms, MD   Chief Complaint  Patient presents with  . Congestive Heart Failure      History of Present Illness: Patricia Fischer is a 60 y.o. female who presents for Nonischemic cardiomyopathy, chronic systolic heart failure, recurrent syncope related to hypotension on medical therapy, history of recurrent pulmonary emboli/saddle emboli, widely patent coronaries on angiography, and left bundle branch block.  Overall, Patricia Fischer is improving. She denies dyspnea on exertion. No recurrent episodes of syncope. She has had these episodes related to pulmonary emboli from which she is now 8 months removed. Other episodes of syncope have occurred in the setting of ACE inhibitor/ARB therapy which he is no longer taking.  She denies dyspnea on exertion. Overall she feels much stronger. There is no peripheral edema. No episodes of dizziness.  Recent consultation in the advanced heart failure clinic was carried out by Dr. Glori Bickers. VQ scan, home O2, and the sleep study were ordered.    Past Medical History  Diagnosis Date  . Essential hypertension   . Asthma   . Environmental allergies   . Pneumonia 2012  . Blood transfusion [place]  . GERD (gastroesophageal reflux disease)   . Depression   . Breast cancer, left breast ([place]) 2011    DCIS  . Head injury 2011  . Anxiety   . Hemorrhoids   . Obesity   . Chronic migraine without aura, with intractable migraine, so stated, with status migrainosus March 2016  . Chronic systolic CHF (congestive heart failure) ([place]) 05/05/2015    a. Dx 04/2015 - EF initially 25-30% with RV dysfunction, f/u echo 08/2015 technically difficult, EF 35-40%, anterior, anteroseptal, apical and inferoapical severe hypokinesis suggestive of LAD territory ischemia/infarct, grade 1 DD, mild MR, RV  normal.  . Syncope     a. Recurrent syncope in 2016 felt 2/2 orthostasis in setting of PE.  . Orthostatic hypotension   . Saddle pulmonary embolus ([place]) Aug. 2016    a. Dx 04/2015 - patient returned 05/2015 after noncompliance with Xarelto.  Marland Kitchen DVT (deep venous thrombosis) ([place]) Aug. 2016    a. Dx 04/2015 - patient returned 05/2015 after noncompliance with Xarelto.  Marland Kitchen LBBB (left bundle branch block)   . Family history of adverse reaction to anesthesia     " My Mother would get deathly sick "  . Shortness of breath dyspnea     Past Surgical History  Procedure Laterality Date  . Breast surgery Left 2011    Breast lumpectomy  . Cardiac catheterization  2010    [facility]; pt states it was "clear"  . Tonsillectomy    . Appendectomy    . Cholecystectomy    . Tubal ligation    . Anal fissure repair    . Knee cartilage surgery Left   . Carpal tunnel release Bilateral   . Tarsal tunnel release Left 1990  . Anterior cervical decomp/discectomy fusion  01/23/2012    Procedure: ANTERIOR CERVICAL DECOMPRESSION/DISCECTOMY FUSION 1 LEVEL;  Surgeon: Elaina Hoops, MD;  Location: [facility];  Service: Neurosurgery;  Laterality: N/A;  Cervical five-six anterior cervical discectomy fusion with plating  . Cesarean section  1982, 1979  . Abdominal hysterectomy  1986  . Colonoscopy  01/16/2009    Dr Bary Castilla  . Shoulder arthroscopy with  subacromial decompression, rotator cuff repair and bicep tendon repair Right 11/22/2013    Procedure: RIGHT SHOULDER ATHROSCOPY OPEN SUBSCAP REPAIR DELTA-PECTORAL ;  Surgeon: Augustin Schooling, MD;  Location: [facility];  Service: Orthopedics;  Laterality: Right;  . Cardiac catheterization N/A 10/28/2015    Procedure: Right/Left Heart Cath and Coronary Angiography;  Surgeon: Belva Crome, MD;  Location: [facility];  Service: Cardiovascular;  Laterality: N/A;     Current Outpatient Prescriptions  Medication Sig Dispense Refill  . albuterol (PROVENTIL HFA;VENTOLIN  HFA) 108 (90 BASE) MCG/ACT inhaler Inhale 2 puffs into the lungs every 6 (six) hours as needed for shortness of breath. Reported on 11/10/2015    . amitriptyline (ELAVIL) 25 MG tablet Take 3 tablets (75 mg total) by mouth at bedtime. 90 tablet 2  . aspirin EC 81 MG tablet Take 324 mg by mouth once.    Marland Kitchen atorvastatin (LIPITOR) 20 MG tablet Take 1 tablet (20 mg total) by mouth daily at 6 PM. 30 tablet 11  . carvedilol (COREG) 6.25 MG tablet Take 1 tablet (6.25 mg total) by mouth 2 (two) times daily. Take 1/2 of tablet twice daily (Patient taking differently: Take 6.25 mg by mouth 2 (two) times daily. ) 60 tablet 5  . fluticasone (FLONASE) 50 MCG/ACT nasal spray Place 1 spray into both nostrils daily as needed for allergies or rhinitis.   11  . levalbuterol (XOPENEX HFA) 45 MCG/ACT inhaler Inhale 2 puffs into the lungs every 4 (four) hours as needed for wheezing.    Marland Kitchen losartan (COZAAR) 50 MG tablet Take 25 mg by mouth daily. Reported on 11/10/2015  11  . nitroGLYCERIN (NITROSTAT) 0.4 MG SL tablet Place 0.4 mg under the tongue every 5 (five) minutes as needed for chest pain. Reported on 11/10/2015    . ondansetron (ZOFRAN ODT) 4 MG disintegrating tablet Take 1 tablet (4 mg total) by mouth every 8 (eight) hours as needed for nausea or vomiting. 30 tablet 2  . prochlorperazine (COMPAZINE) 10 MG tablet Take 1 tablet (10 mg total) by mouth 2 (two) times daily as needed (Headache). 20 tablet 0  . rivaroxaban (XARELTO) 20 MG TABS tablet Take 1 tablet (20 mg total) by mouth daily with supper. 30 tablet 0  . spironolactone (ALDACTONE) 25 MG tablet Take 0.5 tablets (12.5 mg total) by mouth every Monday, Wednesday, and Friday. 10 tablet 5  . tiZANidine (ZANAFLEX) 2 MG tablet Take 1-2 tablets (2-4 mg total) by mouth at bedtime as needed for muscle spasms. 45 tablet 1   No current facility-administered medications for this visit.    Allergies:   Gadolinium derivatives; Sulfa antibiotics; Aripiprazole; Codeine;  Oxycodone; and Penicillins    Social History:  The patient  reports that she has never smoked. She has never used smokeless tobacco. She reports that she does not drink alcohol or use illicit drugs.   Family History:  The patient's family history includes Aneurysm in her paternal grandfather; Breast cancer in her mother and paternal aunt; COPD in her father and paternal grandfather; Cancer in her father and mother; Heart attack in her paternal grandmother; Heart disease in her paternal grandmother; Hypertension in her mother; Migraines in her mother; Rectal cancer in her father; Stroke in her paternal grandmother. There is no history of Anesthesia problems or Diabetes.    ROS:  Please see the history of present illness.   Otherwise, review of systems are positive for Anxiety and family stresses. Some difficulty sleeping..   All other  systems are reviewed and negative.    PHYSICAL EXAM: VS:  BP 118/82 mmHg  Pulse 73  Ht 5\' 4"  (1.626 m)  Wt 223 lb 12.8 oz (101.515 kg)  BMI 38.40 kg/m2 , BMI Body mass index is 38.4 kg/(m^2). GEN: Well nourished, well developed, in no acute distress HEENT: normal Neck: no JVD, carotid bruits, or masses Cardiac: RRR.  There is no murmur, rub, or gallop. There is no edema. Respiratory:  clear to auscultation bilaterally, normal work of breathing. GI: soft, nontender, nondistended, + BS MS: no deformity or atrophy Skin: warm and dry, no rash Neuro:  Strength and sensation are intact Psych: euthymic mood, full affect   EKG:  EKG demonstrates left anterior hemiblock and nonspecific T-wave abnormality with first-degree AV block. She has been demonstrated to have rate related left bundle branch block.   Recent Labs: 05/05/2015: Magnesium 2.2 10/27/2015: ALT 15; B Natriuretic Peptide 26.3; TSH 1.095 10/31/2015: Hemoglobin 11.2*; Platelets 163 12/22/2015: BUN 13; Creatinine, Ser 0.76; Potassium 3.7; Sodium 138    Lipid Panel    Component Value Date/Time    CHOL 171 09/18/2015 1515   TRIG 123 09/18/2015 1515   HDL 51 09/18/2015 1515   CHOLHDL 3.4 09/18/2015 1515   VLDL 25 09/18/2015 1515   LDLCALC 95 09/18/2015 1515      Wt Readings from Last 3 Encounters:  12/28/15 223 lb 12.8 oz (101.515 kg)  12/11/15 230 lb 1.6 oz (104.373 kg)  11/25/15 228 lb 12.8 oz (103.783 kg)      Other studies Reviewed: Additional studies/ records that were reviewed today include: Review of heart failure clinic notes from Dr Haroldine Laws.  The findings include recommended VQ scan to rule out recurrent PE. The study was negative. He documented a drop in O2 saturation to 84% with walking in the halls and prescribed long-term oxygen. The patient has refused to wear this and returned it to the vendor. We reevaluated her resting and exertion oxygen saturations and both were above 94%. She has had no recurrent syncope. She has tolerated the recent change in carvedilol therapy without complications.. A sleep study has been recommended but the patient has negative feelings about going forward with this study and states she would not use the CPAP mask.    ASSESSMENT AND PLAN:  1. Chronic systolic CHF (congestive heart failure) (HCC) Most recent EF was documented to be greater than 35%. She currently denies any specific complaints of heart failure/volume overload - EKG 12-Lead  2. LBBB (left bundle branch block)-rate related  The QRS duration has significantly shortened and is now 102 ms. She does have rate related bundle branch block - EKG 12-Lead  3. Essential hypertension Low normal - EKG 12-Lead  4. Syncope, unspecified syncope type No recurrence. Prior recurrences were related to hypotension on medical therapy no syncope since the most recent admission related to hypotension from ACE/ARB therapy.  5. Probable sleep apnea denoted by snoring.    Current medicines are reviewed at length with the patient today.  The patient has the following concerns regarding  medicines: Continue carvedilol at 6.25 mg twice a day. With her blood pressures as they are, no further up titration at this time.  The following changes/actions have been instituted:    Sleep study  Our documentation today does not demonstrate any need for chronic O2 therapy  May resume driving  Refuses to return to the advanced heart failure clinic  Labs/ tests ordered today include:  Orders Placed This Encounter  Procedures  .  EKG 12-Lead     Disposition:   FU with HS in 4 months  Signed, Sinclair Grooms, MD  12/28/2015 11:09 AM    [facility] [place], [place], [place]  [address] Phone: [phone]; Fax: [phone]

## 2015-12-31 ENCOUNTER — Encounter: Payer: Self-pay | Admitting: *Deleted

## 2015-12-31 ENCOUNTER — Other Ambulatory Visit: Payer: Self-pay | Admitting: *Deleted

## 2015-12-31 NOTE — Patient Outreach (Signed)
[facility]) Care Management  12/31/2015  Patricia Fischer 07-26-1956 [id number]   Late entry from 4/26 Placed call to patient, as previous 3 calls in the 10 days have  been unsuccessful to reschedule home visit and discuss further plan. [name] states that she is doing fine, following up with doctors, and know when to call them for a problem,  weighing herself, checking her blood pressure and has been given  the okay that she can drive.  [name] states she does not have time for home visits and that she is tired of the phone calls, and is not interested in the health coach program at this time.  Patient agreeable to case closure and has [facility] contact information for future needs.  Plan Case Closure per protocol.  [name], RN, [facility] [phone]- Mobile [phone]- Toll Free Main Office

## 2016-01-22 ENCOUNTER — Encounter (HOSPITAL_COMMUNITY): Payer: Medicare Other | Admitting: Internal Medicine

## 2016-02-22 ENCOUNTER — Encounter (HOSPITAL_BASED_OUTPATIENT_CLINIC_OR_DEPARTMENT_OTHER): Payer: Medicare Other

## 2016-02-22 ENCOUNTER — Other Ambulatory Visit: Payer: Self-pay | Admitting: Adult Health

## 2016-02-24 ENCOUNTER — Other Ambulatory Visit: Payer: Self-pay | Admitting: Adult Health

## 2016-02-25 ENCOUNTER — Telehealth: Payer: Self-pay | Admitting: Interventional Cardiology

## 2016-02-25 NOTE — Telephone Encounter (Signed)
SPOKE WITH  PT  NEEDS  TOOTH  EXTRACTED  HAS  ABCESS  IS   VERY PAINFUL  WOULD LIKE  PROCEDURE  DONE  ASAP IS  TAKING  [place] IN ADDITION . WILL FORWARD  TO  DR  Tamala Julian FOR  REVIEW .Adonis Housekeeper

## 2016-02-25 NOTE — Telephone Encounter (Signed)
I called the pt.   She has not been in for office visit since 09/2014 with MM/NP.  Although she has been seen since for nerve block x 2 and spenocath x 3 by Dr. Jannifer Franklin (last seen 02/2015).  I told her she would need to make an appt.  She stated she could not at this time due to being treated for blood clots by Dr. Daneen Schick since august 2016.    She cannot drive.   She told me was taking the topiramate 1 tablet po qhs prn to help her sleep.   She stated that her headaches are better but still have milder ones.  She had only 4-5 pills left and wanted a refill.  I told her that topiramate is not used for sleep, although is causes drowsiness as a side effect.  I did not see on her med list.  She stated Dr. Tamala Julian would not refill for her.  Please advise.  She is taking amitriptylline 75mg  po qhs as well.

## 2016-02-25 NOTE — Telephone Encounter (Signed)
Cleared for extraction. Hold Xarelto for 48 hours prior then resume 24 hours after procedure.

## 2016-02-25 NOTE — Telephone Encounter (Signed)
New message  Pt calling to speak with RN. Pt states she had her oral surgeon fax over clearence forms for her dental procedure. Pt wants to know if forms were received and signed. Please call back to discuss

## 2016-02-25 NOTE — Telephone Encounter (Addendum)
I called and let pt know that topiramate 100mg  po qhs # 90 prescription sent to [place], [place].  Pt very thankful.

## 2016-03-01 ENCOUNTER — Other Ambulatory Visit: Payer: Self-pay

## 2016-03-02 MED ORDER — TIZANIDINE HCL 2 MG PO TABS: 2.0000 mg | ORAL_TABLET | Freq: Every evening | ORAL | Status: DC | PRN

## 2016-03-02 NOTE — Telephone Encounter (Signed)
Left message for patient to return my call.

## 2016-03-02 NOTE — Telephone Encounter (Signed)
Please find out if patient following me to [facility] or staying at [facility]; needs appt please

## 2016-03-02 NOTE — Telephone Encounter (Signed)
Patient returned my call. Stated that she will be coming to [facility] with you. She will call back to make an appointment after she get her tooth ache resolved (she need to get it pulled).

## 2016-03-02 NOTE — Telephone Encounter (Signed)
Follow up   Pt is returning call she is unsure of the reason for the call

## 2016-03-02 NOTE — Telephone Encounter (Signed)
PER PT  WAS NOTIFIED   TOOTH  EXTRACTION BEING  DONE TOM./CY

## 2016-03-02 NOTE — Telephone Encounter (Signed)
LM TO CALL BACK ./CY 

## 2016-03-02 NOTE — Telephone Encounter (Signed)
Refill approved.

## 2016-03-22 ENCOUNTER — Ambulatory Visit
Admission: RE | Admit: 2016-03-22 | Discharge: 2016-03-22 | Disposition: A | Discharge status: Home / Self Care | Payer: Medicare Other | Source: Ambulatory Visit | Attending: Family Medicine | Admitting: Family Medicine

## 2016-03-22 ENCOUNTER — Telehealth: Payer: Self-pay | Admitting: Family Medicine

## 2016-03-22 ENCOUNTER — Other Ambulatory Visit
Admission: RE | Admit: 2016-03-22 | Discharge: 2016-03-22 | Disposition: A | Discharge status: Home / Self Care | Payer: Medicare Other | Source: Ambulatory Visit | Attending: Family Medicine | Admitting: Family Medicine

## 2016-03-22 ENCOUNTER — Ambulatory Visit (INDEPENDENT_AMBULATORY_CARE_PROVIDER_SITE_OTHER): Payer: Medicare Other | Admitting: Family Medicine

## 2016-03-22 ENCOUNTER — Encounter: Payer: Self-pay | Admitting: Family Medicine

## 2016-03-22 VITALS — BP 132/84 | HR 92 | Temp 99.5°F | Resp 14 | Wt 227.0 lb

## 2016-03-22 DIAGNOSIS — IMO0001 Reserved for inherently not codable concepts without codable children: Secondary | ICD-10-CM

## 2016-03-22 DIAGNOSIS — M79604 Pain in right leg: Secondary | ICD-10-CM

## 2016-03-22 DIAGNOSIS — M79601 Pain in right arm: Secondary | ICD-10-CM

## 2016-03-22 DIAGNOSIS — R5383 Other fatigue: Secondary | ICD-10-CM

## 2016-03-22 DIAGNOSIS — I2692 Saddle embolus of pulmonary artery without acute cor pulmonale: Secondary | ICD-10-CM | POA: Diagnosis not present

## 2016-03-22 DIAGNOSIS — Z853 Personal history of malignant neoplasm of breast: Secondary | ICD-10-CM | POA: Insufficient documentation

## 2016-03-22 DIAGNOSIS — R635 Abnormal weight gain: Secondary | ICD-10-CM | POA: Diagnosis not present

## 2016-03-22 DIAGNOSIS — M79606 Pain in leg, unspecified: Secondary | ICD-10-CM | POA: Insufficient documentation

## 2016-03-22 DIAGNOSIS — M79661 Pain in right lower leg: Secondary | ICD-10-CM | POA: Diagnosis not present

## 2016-03-22 DIAGNOSIS — Z86718 Personal history of other venous thrombosis and embolism: Secondary | ICD-10-CM

## 2016-03-22 DIAGNOSIS — D649 Anemia, unspecified: Secondary | ICD-10-CM

## 2016-03-22 DIAGNOSIS — R791 Abnormal coagulation profile: Secondary | ICD-10-CM | POA: Diagnosis not present

## 2016-03-22 DIAGNOSIS — R7989 Other specified abnormal findings of blood chemistry: Secondary | ICD-10-CM

## 2016-03-22 LAB — VITAMIN B12: Vitamin B-12: 251 pg/mL (ref 180–914)

## 2016-03-22 LAB — CBC WITH DIFFERENTIAL/PLATELET
BASOS PCT: 1 %
Basophils Absolute: 0 10*3/uL (ref 0–0.1)
Eosinophils Absolute: 0.3 10*3/uL (ref 0–0.7)
Eosinophils Relative: 5 %
HEMATOCRIT: 36.4 % (ref 35.0–47.0)
HEMOGLOBIN: 12.4 g/dL (ref 12.0–16.0)
LYMPHS PCT: 33 %
Lymphs Abs: 2.1 10*3/uL (ref 1.0–3.6)
MCH: 27.5 pg (ref 26.0–34.0)
MCHC: 34 g/dL (ref 32.0–36.0)
MCV: 80.8 fL (ref 80.0–100.0)
MONOS PCT: 7 %
Monocytes Absolute: 0.5 10*3/uL (ref 0.2–0.9)
NEUTROS ABS: 3.4 10*3/uL (ref 1.4–6.5)
NEUTROS PCT: 54 %
Platelets: 163 10*3/uL (ref 150–440)
RBC: 4.51 MIL/uL (ref 3.80–5.20)
RDW: 14.5 % (ref 11.5–14.5)
WBC: 6.2 10*3/uL (ref 3.6–11.0)

## 2016-03-22 LAB — TSH: TSH: 0.944 u[IU]/mL (ref 0.350–4.500)

## 2016-03-22 LAB — FERRITIN: FERRITIN: 12 ng/mL (ref 11–307)

## 2016-03-22 LAB — FIBRIN DERIVATIVES D-DIMER (ARMC ONLY): Fibrin derivatives D-dimer (ARMC): 533 — ABNORMAL HIGH (ref 0–499)

## 2016-03-22 NOTE — Assessment & Plan Note (Signed)
Check TSH given the weight gain; she does not appear fluid-overloaded

## 2016-03-22 NOTE — Progress Notes (Signed)
BP 132/84 mmHg  Pulse 92  Temp(Src) 99.5 F (37.5 C) (Oral)  Resp 14  Wt 227 lb (102.967 kg)  SpO2 97%   Subjective:    Patient ID: Patricia Fischer, female    DOB: 10/26/1955, 60 y.o.   MRN: [id number]  HPI: Patricia Fischer is a 60 y.o. female  Chief Complaint  Patient presents with  . Leg Pain    right upper hx of blood clots.  Onset 2 weeks   She is having pain in the lateral right leg; no certain place where it hurts; just getting worse; they wanted to take her to the hospital there; when you first get a bruise, it's a stabbing pain; then just keeps getting worse; hard to explain the pain; hard to walk from waiting room, just from the pain; no one particular place that hurts; just sitting and no weight feels better  Had to have a tooth extracted 2 weeks ago last Thursday; had to be off Xarelto for two days and two days after, so she went for a period of time off of anticoagulation; there is no recollection of injury; not massage them; can't take ibuprofen; not taking any tylenol[place]; pain started right after the tooth taken out  She is on carvedilol and aldactone; she was hospitalized for 10 days after last visit with her   Her good cholesterol was not what it should be; the HDL was too low; she goes back to see the heart doctor on September 7th; tries to eat a pretty good diet Lab Results  Component Value Date   CHOL 171 09/18/2015   CHOL 111* 08/25/2015   CHOL 140 04/20/2015   Lab Results  Component Value Date   HDL 51 09/18/2015   HDL 44* 08/25/2015   HDL 29* 04/20/2015   Lab Results  Component Value Date   LDLCALC 95 09/18/2015   LDLCALC 50 08/25/2015   LDLCALC 78 04/20/2015   Lab Results  Component Value Date   TRIG 123 09/18/2015   TRIG 87 08/25/2015   TRIG 164* 04/20/2015   Lab Results  Component Value Date   CHOLHDL 3.4 09/18/2015   CHOLHDL 2.5 08/25/2015   CHOLHDL 4.8 04/20/2015   No results found for: LDLDIRECT  She has put on  weight; does not have enough energy  Depression screen [facility] 2/9 03/22/2016 11/10/2015 08/04/2015  Decreased Interest 0 0 2  Down, Depressed, Hopeless 0 1 1  PHQ - 2 Score 0 1 3  Altered sleeping - - 2  Tired, decreased energy - - 3  Change in appetite - - 2  Feeling bad or failure about yourself  - - 2  Trouble concentrating - - 1  Moving slowly or fidgety/restless - - 0  Suicidal thoughts - - 0  PHQ-9 Score - - 13   Relevant past medical, surgical, family and social history reviewed Past Medical History  Diagnosis Date  . Essential hypertension   . Asthma   . Environmental allergies   . Pneumonia 2012  . Blood transfusion [place]  . GERD (gastroesophageal reflux disease)   . Depression   . Breast cancer, left breast ([place]) 2011    DCIS  . Head injury 2011  . Anxiety   . Hemorrhoids   . Obesity   . Chronic migraine without aura, with intractable migraine, so stated, with status migrainosus March 2016  . Chronic systolic CHF (congestive heart failure) ([place]) 05/05/2015  a. Dx 04/2015 - EF initially 25-30% with RV dysfunction, f/u echo 08/2015 technically difficult, EF 35-40%, anterior, anteroseptal, apical and inferoapical severe hypokinesis suggestive of LAD territory ischemia/infarct, grade 1 DD, mild MR, RV normal.  . Syncope     a. Recurrent syncope in 2016 felt 2/2 orthostasis in setting of PE.  . Orthostatic hypotension   . 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 Dx 04/2015 - patient returned 05/2015 after noncompliance with Xarelto.  Marland Kitchen DVT (deep venous thrombosis) ([place]) Aug. 2016    a. Dx 04/2015 - patient returned 05/2015 after noncompliance with Xarelto.  Marland Kitchen LBBB (left bundle branch block)   . Family history of adverse reaction to anesthesia     " My Mother would get deathly sick "  . Shortness of breath dyspnea    Past Surgical History  Procedure Laterality Date  . Breast surgery Left 2011    Breast lumpectomy  . Cardiac catheterization  2010     [facility]; pt states it was "clear"  . Tonsillectomy    . Appendectomy    . Cholecystectomy    . Tubal ligation    . Anal fissure repair    . Knee cartilage surgery Left   . Carpal tunnel release Bilateral   . Tarsal tunnel release Left 1990  . Anterior cervical decomp/discectomy fusion  01/23/2012    Procedure: ANTERIOR CERVICAL DECOMPRESSION/DISCECTOMY FUSION 1 LEVEL;  Surgeon: Elaina Hoops, MD;  Location: [facility];  Service: Neurosurgery;  Laterality: N/A;  Cervical five-six anterior cervical discectomy fusion with plating  . Cesarean section  1982, 1979  . Abdominal hysterectomy  1986  . Colonoscopy  01/16/2009    Dr Bary Castilla  . Shoulder arthroscopy with subacromial decompression, rotator cuff repair and bicep tendon repair Right 11/22/2013    Procedure: RIGHT SHOULDER ATHROSCOPY OPEN SUBSCAP REPAIR DELTA-PECTORAL ;  Surgeon: Augustin Schooling, MD;  Location: [facility];  Service: Orthopedics;  Laterality: Right;  . Cardiac catheterization N/A 10/28/2015    Procedure: Right/Left Heart Cath and Coronary Angiography;  Surgeon: Belva Crome, MD;  Location: [facility];  Service: Cardiovascular;  Laterality: N/A;   Family History  Problem Relation Age of Onset  . Anesthesia problems Neg Hx   . Diabetes Neg Hx   . Breast cancer Paternal Aunt   . Breast cancer Mother   . Migraines Mother   . Cancer Mother   . Hypertension Mother   . Rectal cancer Father   . Cancer Father   . COPD Father   . Heart disease Paternal Grandmother   . Stroke Paternal Grandmother   . Heart attack Paternal Grandmother   . Aneurysm Paternal Grandfather   . COPD Paternal Grandfather   MD note: mother had vitamin B12 deficiency  Social History  Substance Use Topics  . Smoking status: Never Smoker   . Smokeless tobacco: Never Used  . Alcohol Use: No     Comment: occasional wine   Interim medical history since last visit reviewed. Allergies and medications reviewed  Review of Systems Per HPI  unless specifically indicated above     Objective:    BP 132/84 mmHg  Pulse 92  Temp(Src) 99.5 F (37.5 C) (Oral)  Resp 14  Wt 227 lb (102.967 kg)  SpO2 97%  Wt Readings from Last 3 Encounters:  03/22/16 227 lb (102.967 kg)  12/28/15 223 lb 12.8 oz (101.515 kg)  12/11/15 230 lb 1.6 oz (104.373 kg)   body  mass index is 38.95 kg/(m^2).  Physical Exam  Constitutional: She appears well-developed and well-nourished.  Older female, appears weak and tired, nontoxic  HENT:  Head: Normocephalic and atraumatic.  Eyes: EOM are normal. No scleral icterus.  Neck: No JVD present.  Cardiovascular: Normal rate and regular rhythm.   Pulmonary/Chest: Effort normal and breath sounds normal.  Abdominal: Bowel sounds are normal. She exhibits no distension.  Musculoskeletal: She exhibits no edema.       Right upper leg: She exhibits no tenderness, no bony tenderness, no swelling, no edema and no deformity.  Area in question is the right anterolateral leg; no swelling, no visible skin changes; no firm areas, no nodules, no masses; no distal leg edema, no redness or swelling of either leg  Skin: Skin is warm and dry. No bruising, no ecchymosis, no lesion and no rash noted. No pallor.  No skin lesions over the right leg  Psychiatric: Her mood appears not anxious. She does not exhibit a depressed mood.   Results for orders placed or performed during the hospital encounter of [id number]  Basic metabolic panel  Result Value Ref Range   Sodium 138 135 - 145 mmol/L   Potassium 3.7 3.5 - 5.1 mmol/L   Chloride 106 101 - 111 mmol/L   CO2 27 22 - 32 mmol/L   Glucose, Bld 108 (H) 65 - 99 mg/dL   BUN 13 6 - 20 mg/dL   Creatinine, Ser 0.76 0.44 - 1.00 mg/dL   Calcium 9.2 8.9 - 10.3 mg/dL   GFR calc non Af Amer >60 >60 mL/min   GFR calc Af Amer >60 >60 mL/min   Anion gap 5 5 - 15      Assessment & Plan:   Problem List Items Addressed This Visit      Cardiovascular and Mediastinum   Saddle pulmonary  embolus (HCC)    History, hx of bilateral DVTs, off of anticoagulation for 4 days total, will get D-dimer and then leg doppler if positive today      Relevant Orders   D-Dimer, Quantitative     Other   Obesity, Class II, BMI 35-39.9, with comorbidity (HCC)    Check TSH      Leg pain, lateral - Primary    This does not appear consistent with DVT; suspect musculoskeletal cause; however, with hx of DVTs, will get D-dimer; if positive, then LE venous duplex to r/o DVT; if positive, may need Greenfield filter, explained to patient      Relevant Orders   D-Dimer, Quantitative   Hx of breast cancer   Relevant Orders   MM Digital Diagnostic Bilat   Fatigue   Relevant Orders   Vitamin B12   Anemia    Recheck CBC; iron-rich foods may help if still low      Relevant Orders   CBC with Differential/Platelet   Vitamin B12   Ferritin   Abnormal weight gain    Check TSH given the weight gain; she does not appear fluid-overloaded      Relevant Orders   TSH      Follow up plan: No Follow-up on file.  An after-visit summary was printed and given to the patient at [facility].  Please see the patient instructions which may contain other information and recommendations beyond what is mentioned above in the assessment and plan.  No orders of the defined types were placed in this encounter.    Orders Placed This Encounter  Procedures  . MM Digital Diagnostic  Bilat  . D-Dimer, Quantitative  . CBC with Differential/Platelet  . Vitamin B12  . Ferritin  . TSH

## 2016-03-22 NOTE — Assessment & Plan Note (Signed)
History, hx of bilateral DVTs, off of anticoagulation for 4 days total, will get D-dimer and then leg doppler if positive today

## 2016-03-22 NOTE — Assessment & Plan Note (Signed)
Recheck CBC; iron-rich foods may help if still low

## 2016-03-22 NOTE — Assessment & Plan Note (Signed)
Check TSH 

## 2016-03-22 NOTE — Patient Instructions (Signed)
Please have your cardiologist clarify your carvedilol

## 2016-03-22 NOTE — Telephone Encounter (Signed)
Adonis Huguenin from [facility] giving D Dimer report: patient is there waiting. [phone]

## 2016-03-22 NOTE — Telephone Encounter (Signed)
Positive, will get the right LE venous duplex; will actually get both legs since she had DVTs in both legs which resulted in multiple PEs

## 2016-03-22 NOTE — Telephone Encounter (Signed)
Patient is requesting return call pertaining to test results from today. She is asking for a call back today

## 2016-03-22 NOTE — Telephone Encounter (Signed)
I called patient as she requested Talked with patient; we discussed the scan results; no blood clot May be musculoskeletal; try topical (ice or heat); if not better in 1-2 weeks or getting worse, let me know and we can have her see ortho or other step (MRI), but would want to try conservative measures first before considering tumors, etc.; please do call if needed

## 2016-03-22 NOTE — Assessment & Plan Note (Signed)
Hx of bilateral DVTs, hx of PEs; positive D-dimer; order bilateral lower extremity venous duplex

## 2016-03-22 NOTE — Assessment & Plan Note (Signed)
This does not appear consistent with DVT; suspect musculoskeletal cause; however, with hx of DVTs, will get D-dimer; if positive, then LE venous duplex to r/o DVT; if positive, may need Greenfield filter, explained to patient

## 2016-03-24 NOTE — Telephone Encounter (Signed)
Already done

## 2016-03-28 ENCOUNTER — Telehealth: Payer: Self-pay | Admitting: Family Medicine

## 2016-03-28 DIAGNOSIS — M79604 Pain in right leg: Secondary | ICD-10-CM

## 2016-03-28 NOTE — Telephone Encounter (Signed)
We'll start with plain films, then; I don't think I can get any sort of advanced imaging without plain films; she can get those done today or tomorrow

## 2016-03-28 NOTE — Telephone Encounter (Signed)
Pt.notified

## 2016-03-28 NOTE — Assessment & Plan Note (Signed)
Pain lateral right thigh

## 2016-03-29 ENCOUNTER — Ambulatory Visit
Admission: RE | Admit: 2016-03-29 | Discharge: 2016-03-29 | Disposition: A | Discharge status: Home / Self Care | Payer: Medicare Other | Source: Ambulatory Visit | Attending: Family Medicine | Admitting: Family Medicine

## 2016-03-29 ENCOUNTER — Inpatient Hospital Stay: Admit: 2016-03-29 | Payer: Self-pay

## 2016-03-29 DIAGNOSIS — M1711 Unilateral primary osteoarthritis, right knee: Secondary | ICD-10-CM | POA: Insufficient documentation

## 2016-03-29 DIAGNOSIS — M79601 Pain in right arm: Secondary | ICD-10-CM | POA: Insufficient documentation

## 2016-03-29 DIAGNOSIS — M79604 Pain in right leg: Secondary | ICD-10-CM | POA: Diagnosis not present

## 2016-03-29 DIAGNOSIS — M160 Bilateral primary osteoarthritis of hip: Secondary | ICD-10-CM | POA: Diagnosis not present

## 2016-03-29 DIAGNOSIS — M1611 Unilateral primary osteoarthritis, right hip: Secondary | ICD-10-CM | POA: Diagnosis not present

## 2016-03-31 ENCOUNTER — Other Ambulatory Visit: Payer: Self-pay | Admitting: Family Medicine

## 2016-03-31 ENCOUNTER — Telehealth: Payer: Self-pay | Admitting: Family Medicine

## 2016-03-31 DIAGNOSIS — M169 Osteoarthritis of hip, unspecified: Secondary | ICD-10-CM | POA: Insufficient documentation

## 2016-03-31 DIAGNOSIS — M79604 Pain in right leg: Secondary | ICD-10-CM

## 2016-03-31 DIAGNOSIS — M160 Bilateral primary osteoarthritis of hip: Secondary | ICD-10-CM

## 2016-03-31 NOTE — Assessment & Plan Note (Signed)
Refer to Dr. Marry Guan per pt request

## 2016-04-08 ENCOUNTER — Telehealth: Payer: Self-pay | Admitting: Family Medicine

## 2016-04-08 DIAGNOSIS — Z853 Personal history of malignant neoplasm of breast: Secondary | ICD-10-CM

## 2016-04-08 NOTE — Assessment & Plan Note (Signed)
Left breast 2011; diag mammo

## 2016-04-08 NOTE — Telephone Encounter (Signed)
ordered

## 2016-04-19 NOTE — Telephone Encounter (Signed)
COMPLETED

## 2016-05-12 ENCOUNTER — Encounter (INDEPENDENT_AMBULATORY_CARE_PROVIDER_SITE_OTHER): Payer: Self-pay

## 2016-05-12 ENCOUNTER — Ambulatory Visit (INDEPENDENT_AMBULATORY_CARE_PROVIDER_SITE_OTHER): Payer: Medicare Other | Admitting: Interventional Cardiology

## 2016-05-12 ENCOUNTER — Encounter: Payer: Self-pay | Admitting: Interventional Cardiology

## 2016-05-12 VITALS — BP 130/86 | HR 74 | Ht 65.0 in | Wt 229.8 lb

## 2016-05-12 DIAGNOSIS — I10 Essential (primary) hypertension: Secondary | ICD-10-CM

## 2016-05-12 DIAGNOSIS — I447 Left bundle-branch block, unspecified: Secondary | ICD-10-CM

## 2016-05-12 DIAGNOSIS — I5022 Chronic systolic (congestive) heart failure: Secondary | ICD-10-CM

## 2016-05-12 DIAGNOSIS — I272 Other secondary pulmonary hypertension: Secondary | ICD-10-CM

## 2016-05-12 DIAGNOSIS — I951 Orthostatic hypotension: Secondary | ICD-10-CM

## 2016-05-12 MED ORDER — CARVEDILOL 6.25 MG PO TABS: 6.2500 mg | ORAL_TABLET | Freq: Two times a day (BID) | ORAL | 11 refills | Status: DC

## 2016-05-12 MED ORDER — RIVAROXABAN 20 MG PO TABS: 20.0000 mg | ORAL_TABLET | Freq: Every day | ORAL | 11 refills | Status: DC

## 2016-05-12 MED ORDER — SPIRONOLACTONE 25 MG PO TABS: 25.0000 mg | ORAL_TABLET | ORAL | 3 refills | Status: DC

## 2016-05-12 NOTE — Progress Notes (Signed)
Cardiology Office Note    Date:  05/12/2016   ID:  Patricia Fischer, DOB 02-Jun-1956, MRN [id number]  PCP:  [name], MD  Cardiologist: Sinclair Grooms, MD   Chief Complaint  Patient presents with  . Congestive Heart Failure    History of Present Illness:  Patricia Fischer is a 60 y.o. female history of old embolus, chronic systolic heart failure, essential hypertension, and chronic anticoagulation therapy.  She feels great. She has not had syncope. Denies orthopnea and PND. No significant episodes of chest pain. Confusion concerning her medical regimen. Been taking carvedilol 2 tablets at night rather than twice a day.  Past Medical History:  Diagnosis Date  . Anxiety   . Asthma   . Blood transfusion [place]  . Breast cancer, left breast ([place]) 2011   DCIS  . Chronic migraine without aura, with intractable migraine, so stated, with status migrainosus March 2016  . Chronic systolic CHF (congestive heart failure) ([place]) 05/05/2015   a. Dx 04/2015 - EF initially 25-30% with RV dysfunction, f/u echo 08/2015 technically difficult, EF 35-40%, anterior, anteroseptal, apical and inferoapical severe hypokinesis suggestive of LAD territory ischemia/infarct, grade 1 DD, mild MR, RV normal.  . Depression   . DVT (deep venous thrombosis) ([place]) Aug. 2016   a. Dx 04/2015 - patient returned 05/2015 after noncompliance with Xarelto.  . Environmental allergies   . Essential hypertension   . Family history of adverse reaction to anesthesia    " My Mother would get deathly sick "  . GERD (gastroesophageal reflux disease)   . Head injury 2011  . Hemorrhoids   . LBBB (left bundle branch block)   . Obesity   . Orthostatic hypotension   . Pneumonia 2012  . 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 2016   a. Dx 04/2015 - patient returned 05/2015 after noncompliance with Xarelto.  . Shortness of breath dyspnea   . Syncope    a. Recurrent syncope in 2016 felt 2/2 orthostasis in setting of  PE.    Past Surgical History:  Procedure Laterality Date  . ABDOMINAL HYSTERECTOMY  1986  . ANAL FISSURE REPAIR    . ANTERIOR CERVICAL DECOMP/DISCECTOMY FUSION  01/23/2012   Procedure: ANTERIOR CERVICAL DECOMPRESSION/DISCECTOMY FUSION 1 LEVEL;  Surgeon: Elaina Hoops, MD;  Location: [facility];  Service: Neurosurgery;  Laterality: N/A;  Cervical five-six anterior cervical discectomy fusion with plating  . APPENDECTOMY    . BREAST SURGERY Left 2011   Breast lumpectomy  . CARDIAC CATHETERIZATION  2010   [facility]; pt states it was "clear"  . CARDIAC CATHETERIZATION N/A 10/28/2015   Procedure: Right/Left Heart Cath and Coronary Angiography;  Surgeon: Belva Crome, MD;  Location: [facility];  Service: Cardiovascular;  Laterality: N/A;  . CARPAL TUNNEL RELEASE Bilateral   . CESAREAN SECTION  1982, 1979  . CHOLECYSTECTOMY    . COLONOSCOPY  01/16/2009   Dr Bary Castilla  . KNEE CARTILAGE SURGERY Left   . SHOULDER ARTHROSCOPY WITH SUBACROMIAL DECOMPRESSION, ROTATOR CUFF REPAIR AND BICEP TENDON REPAIR Right 11/22/2013   Procedure: RIGHT SHOULDER ATHROSCOPY OPEN SUBSCAP REPAIR DELTA-PECTORAL ;  Surgeon: Augustin Schooling, MD;  Location: [facility];  Service: Orthopedics;  Laterality: Right;  . TARSAL TUNNEL RELEASE Left 1990  . TONSILLECTOMY    . TUBAL LIGATION      Current Medications: Outpatient Medications Prior to Visit  Medication Sig Dispense Refill  . albuterol (PROVENTIL HFA;VENTOLIN HFA) 108 (90 BASE)  MCG/ACT inhaler Inhale 2 puffs into the lungs every 6 (six) hours as needed for shortness of breath. Reported on 11/10/2015    . amitriptyline (ELAVIL) 25 MG tablet Take 3 tablets (75 mg total) by mouth at bedtime. 90 tablet 2  . atorvastatin (LIPITOR) 20 MG tablet Take 1 tablet (20 mg total) by mouth daily at 6 PM. 30 tablet 11  . carvedilol (COREG) 6.25 MG tablet Take 1 tablet (6.25 mg total) by mouth 2 (two) times daily. Take 1/2 of tablet twice daily (Patient taking differently: Take  6.25 mg by mouth 2 (two) times daily. ) 60 tablet 5  . fluticasone (FLONASE) 50 MCG/ACT nasal spray Place 1 spray into both nostrils daily as needed for allergies or rhinitis.   11  . levalbuterol (XOPENEX HFA) 45 MCG/ACT inhaler Inhale 2 puffs into the lungs every 4 (four) hours as needed for wheezing.    . ondansetron (ZOFRAN ODT) 4 MG disintegrating tablet Take 1 tablet (4 mg total) by mouth every 8 (eight) hours as needed for nausea or vomiting. 30 tablet 2  . prochlorperazine (COMPAZINE) 10 MG tablet Take 1 tablet (10 mg total) by mouth 2 (two) times daily as needed (Headache). 20 tablet 0  . rivaroxaban (XARELTO) 20 MG TABS tablet Take 1 tablet (20 mg total) by mouth daily with supper. 30 tablet 0  . spironolactone (ALDACTONE) 25 MG tablet Take 0.5 tablets (12.5 mg total) by mouth every Monday, Wednesday, and Friday. 10 tablet 5  . tiZANidine (ZANAFLEX) 2 MG tablet Take 1-2 tablets (2-4 mg total) by mouth at bedtime as needed for muscle spasms. 45 tablet 0  . topiramate (TOPAMAX) 100 MG tablet TAKE 1 TABLET (100 MG TOTAL) BY MOUTH AT BEDTIME. 90 tablet 0   No facility-administered medications prior to visit.      Allergies:   Gadolinium derivatives; Sulfa antibiotics; Aripiprazole; Codeine; Oxycodone; and Penicillins   Social History   Social History  . Marital status: Divorced    Spouse name: N/A  . Number of children: 2  . Years of education: college-1   Occupational History  . Unemployed    Social History Main Topics  . Smoking status: Never Smoker  . Smokeless tobacco: Never Used  . Alcohol use No     Comment: occasional wine  . Drug use: No  . Sexual activity: No   Other Topics Concern  . None   Social History Narrative  . None     Family History:  The patient's family history includes Aneurysm in her paternal grandfather; Breast cancer in her mother and paternal aunt; COPD in her father and paternal grandfather; Cancer in her father and mother; Heart attack in her  paternal grandmother; Heart disease in her paternal grandmother; Hypertension in her mother; Migraines in her mother; Rectal cancer in her father; Stroke in her paternal grandmother.   ROS:   Please see the history of present illness.    Leg pain, excessive fatigue, headaches.  All other systems reviewed and are negative.   PHYSICAL EXAM:   VS:  BP 130/86   Pulse 74   Ht 5\' 5"  (1.651 m)   Wt 229 lb 12.8 oz (104.2 kg)   BMI 38.24 kg/m    GEN: Well nourished, well developed, in no acute distress  HEENT: normal  Neck: no JVD, carotid bruits, or masses Cardiac: RRR; no murmurs, rubs, or gallops,no edema  Respiratory:  clear to auscultation bilaterally, normal work of breathing GI: soft, nontender, nondistended, + BS  MS: no deformity or atrophy  Skin: warm and dry, no rash Neuro:  Alert and Oriented x 3, Strength and sensation are intact Psych: euthymic mood, full affect  Wt Readings from Last 3 Encounters:  05/12/16 229 lb 12.8 oz (104.2 kg)  03/22/16 227 lb (103 kg)  12/28/15 223 lb 12.8 oz (101.5 kg)      Studies/Labs Reviewed:   EKG:  EKG  Not done  Recent Labs: 10/27/2015: ALT 15; B Natriuretic Peptide 26.3 12/22/2015: BUN 13; Creatinine, Ser 0.76; Potassium 3.7; Sodium 138 03/22/2016: Hemoglobin 12.4; Platelets 163; TSH 0.944   Lipid Panel    Component Value Date/Time   CHOL 171 09/18/2015 1515   TRIG 123 09/18/2015 1515   HDL 51 09/18/2015 1515   CHOLHDL 3.4 09/18/2015 1515   VLDL 25 09/18/2015 1515   [place] 95 09/18/2015 1515    Additional studies/ records that were reviewed today include:  Echocardiogram 2016:   ------------------------------------------------------------------- LV EF: 35% -   40%   ------------------------------------------------------------------- Indications:      Elevated troponin (R79.89).   ------------------------------------------------------------------- History:   PMH:   Congestive heart failure.  Risk factors: Pulmonary  embolus. Migraine. LBBB. DVT. Pulmonary hypertension. Anemia. Depression. Anxiety.   ------------------------------------------------------------------- Study Conclusions   - Procedure narrative: Transthoracic echocardiography. Image   quality was poor. The study was technically difficult.   Intravenous contrast (Definity) was administered. - Left ventricle: The cavity size was normal. Wall thickness was   normal. Systolic function was moderately reduced. The estimated   ejection fraction was in the range of 35% to 40%. There is   anterior, anteroseptal, apical and inferoapical severe   hypokinesis suggestive of LAD territory ischemia/infarct. No   mural thrombus was identified with Definity contrast. Doppler   parameters are consistent with abnormal left ventricular   relaxation (grade 1 diastolic dysfunction). The E/e&' ratio >15,   suggesting elevated LV filling pressure. - Mitral valve: Mildly thickened leaflets . There was mild   regurgitation. - Left atrium: The atrium was at the upper limits of normal in   size. - Right atrium: The atrium was at the upper limits of normal in   size.   Impressions:   - Compared to the prior echo in 04/2015, the EF remains 35-40%.   There is a marked LAD territory wall motion abnormality. No mural   thrombus is noted with Definity contrast. LV filling pressure is   elevated.     ASSESSMENT:    1. Chronic systolic CHF (congestive heart failure) ([place])   2. Essential hypertension   3. LBBB (left bundle branch block)-rate related    4. Orthostatic hypotension   5. Pulmonary hypertension ([place])      PLAN:  In order of problems listed above:  1. Change carvedilol to 6.25 mg twice a day. Repeat echocardiogram on return in 9 months. Long discussion concerning prognosis. 2. Low salt diet. Same therapy. Much time is spent today by both my nurse and I discussing her medical regimen and giving instruction concerning therapy. 3. Not  reevaluated. 4. Not currently present 5. Continue chronic anticoagulation therapy because of prior massive pulmonary emboli.    Medication Adjustments/Labs and Tests Ordered: Current medicines are reviewed at length with the patient today.  Concerns regarding medicines are outlined above.  Medication changes, Labs and Tests ordered today are listed in the Patient Instructions below. There are no Patient Instructions on file for this visit.   Signed, Sinclair Grooms, MD  05/12/2016 10:44 AM  [facility] [address], [place], [place]  [address] Phone: [phone]; Fax: [phone]

## 2016-05-12 NOTE — Patient Instructions (Signed)
Your physician recommends that you continue on your current medications as directed. Please refer to the Current Medication list given to you today. PLEASE TAKE YOUR COREG AS DIRECTED:  TAKE 1 TABLET IN THE AM; 1 TABLET IN PM.   PLEASE FOLLOW WITH YOUR PCP IN REGARD TO YOUR CHOLESTEROL MEDICATION.  Your physician has requested that you have an echocardiogram. Echocardiography is a painless test that uses sound waves to create images of your heart. It provides your doctor with information about the size and shape of your heart and how well your heart's chambers and valves are working. This procedure takes approximately one hour. There are no restrictions for this procedure.  PLEASE PLAN TO SCHEDULE THIS PRIOR TO YOUR NEXT VISIT WITH DR. Tamala Julian.    Your physician wants you to follow-up in: 8-9 [place] Tamala Julian.  You will receive a reminder letter in the mail two months in advance. If you don't receive a letter, please call our office to schedule the follow-up appointment.

## 2016-05-13 NOTE — Telephone Encounter (Signed)
Please close Encounter °

## 2016-05-17 ENCOUNTER — Ambulatory Visit (INDEPENDENT_AMBULATORY_CARE_PROVIDER_SITE_OTHER): Payer: Medicare Other | Admitting: Family Medicine

## 2016-05-17 ENCOUNTER — Encounter: Payer: Self-pay | Admitting: Family Medicine

## 2016-05-17 ENCOUNTER — Other Ambulatory Visit: Payer: Self-pay | Admitting: Family Medicine

## 2016-05-17 VITALS — BP 132/86 | HR 97 | Temp 99.2°F | Resp 16 | Wt 230.7 lb

## 2016-05-17 DIAGNOSIS — Z124 Encounter for screening for malignant neoplasm of cervix: Secondary | ICD-10-CM

## 2016-05-17 DIAGNOSIS — Z853 Personal history of malignant neoplasm of breast: Secondary | ICD-10-CM

## 2016-05-17 DIAGNOSIS — Z0000 Encounter for general adult medical examination without abnormal findings: Secondary | ICD-10-CM | POA: Diagnosis not present

## 2016-05-17 DIAGNOSIS — I5022 Chronic systolic (congestive) heart failure: Secondary | ICD-10-CM | POA: Diagnosis not present

## 2016-05-17 DIAGNOSIS — Z23 Encounter for immunization: Secondary | ICD-10-CM | POA: Diagnosis not present

## 2016-05-17 DIAGNOSIS — Z5181 Encounter for therapeutic drug level monitoring: Secondary | ICD-10-CM | POA: Diagnosis not present

## 2016-05-17 NOTE — Patient Instructions (Addendum)
Check out the information at familydoctor.org entitled "Nutrition for Weight Loss: What You Need to Know about Fad Diets" Try to lose between 1-2 pounds per week by taking in fewer calories and burning off more calories You can succeed by limiting portions, limiting foods dense in calories and fat, becoming more active, and drinking 8 glasses of water a day (64 ounces) Don't skip meals, especially breakfast, as skipping meals may alter your metabolism Do not use over-the-counter weight loss pills or gimmicks that claim rapid weight loss A healthy BMI (or body mass index) is between 18.5 and 24.9 You can calculate your ideal BMI at the [place] website [employer]fr  You can try Duofilm on the place on your leg; if not gone in 3-4 weeks, consider seeing dermatologist  Try B12 sublingual (SL)  Health Maintenance, Female Adopting a healthy lifestyle and getting preventive care can go a long way to promote health and wellness. Talk with your health care provider about what schedule of regular examinations is right for you. This is a good chance for you to check in with your provider about disease prevention and staying healthy. In between checkups, there are plenty of things you can do on your own. Experts have done a lot of research about which lifestyle changes and preventive measures are most likely to keep you healthy. Ask your health care provider for more information. WEIGHT AND DIET  Eat a healthy diet  Be sure to include plenty of vegetables, fruits, low-fat dairy products, and lean protein.  Do not eat a lot of foods high in solid fats, added sugars, or salt.  Get regular exercise. This is one of the most important things you can do for your health.  Most adults should exercise for at least 150 minutes each week. The exercise should increase your heart rate and make you sweat (moderate-intensity exercise).  Most adults should also do  strengthening exercises at least twice a week. This is in addition to the moderate-intensity exercise.  Maintain a healthy weight  Body mass index (BMI) is a measurement that can be used to identify possible weight problems. It estimates body fat based on height and weight. Your health care provider can help determine your BMI and help you achieve or maintain a healthy weight.  For females 19 years of age and older:   A BMI below 18.5 is considered underweight.  A BMI of 18.5 to 24.9 is normal.  A BMI of 25 to 29.9 is considered overweight.  A BMI of 30 and above is considered obese.  Watch levels of cholesterol and blood lipids  You should start having your blood tested for lipids and cholesterol at 61 years of age, then have this test every 5 years.  You may need to have your cholesterol levels checked more often if:  Your lipid or cholesterol levels are high.  You are older than 61 years of age.  You are at high risk for heart disease.  CANCER SCREENING   Lung Cancer  Lung cancer screening is recommended for adults 65-76 years old who are at high risk for lung cancer because of a history of smoking.  A yearly low-dose CT scan of the lungs is recommended for people who:  Currently smoke.  Have quit within the past 15 years.  Have at least a 30-pack-year history of smoking. A pack year is smoking an average of one pack of cigarettes a day for 1 year.  Yearly screening should continue until it has been  15 years since you quit.  Yearly screening should stop if you develop a health problem that would prevent you from having lung cancer treatment.  Breast Cancer  Practice breast self-awareness. This means understanding how your breasts normally appear and feel.  It also means doing regular breast self-exams. Let your health care provider know about any changes, no matter how small.  If you are in your 20s or 30s, you should have a clinical breast exam (CBE) by a  health care provider every 1-3 years as part of a regular health exam.  If you are 35 or older, have a CBE every year. Also consider having a breast X-ray (mammogram) every year.  If you have a family history of breast cancer, talk to your health care provider about genetic screening.  If you are at high risk for breast cancer, talk to your health care provider about having an MRI and a mammogram every year.  Breast cancer gene (BRCA) assessment is recommended for women who have family members with BRCA-related cancers. BRCA-related cancers include:  Breast.  Ovarian.  Tubal.  Peritoneal cancers.  Results of the assessment will determine the need for genetic counseling and BRCA1 and BRCA2 testing. Cervical Cancer Your health care provider may recommend that you be screened regularly for cancer of the pelvic organs (ovaries, uterus, and vagina). This screening involves a pelvic examination, including checking for microscopic changes to the surface of your cervix (Pap test). You may be encouraged to have this screening done every 3 years, beginning at age 82.  For women ages 1-65, health care providers may recommend pelvic exams and Pap testing every 3 years, or they may recommend the Pap and pelvic exam, combined with testing for human papilloma virus (HPV), every 5 years. Some types of HPV increase your risk of cervical cancer. Testing for HPV may also be done on women of any age with unclear Pap test results.  Other health care providers may not recommend any screening for nonpregnant women who are considered low risk for pelvic cancer and who do not have symptoms. Ask your health care provider if a screening pelvic exam is right for you.  If you have had past treatment for cervical cancer or a condition that could lead to cancer, you need Pap tests and screening for cancer for at least 20 years after your treatment. If Pap tests have been discontinued, your risk factors (such as having a  new sexual partner) need to be reassessed to determine if screening should resume. Some women have medical problems that increase the chance of getting cervical cancer. In these cases, your health care provider may recommend more frequent screening and Pap tests. Colorectal Cancer  This type of cancer can be detected and often prevented.  Routine colorectal cancer screening usually begins at 61 years of age and continues through 61 years of age.  Your health care provider may recommend screening at an earlier age if you have risk factors for colon cancer.  Your health care provider may also recommend using home test kits to check for hidden blood in the stool.  A small camera at the end of a tube can be used to examine your colon directly (sigmoidoscopy or colonoscopy). This is done to check for the earliest forms of colorectal cancer.  Routine screening usually begins at age 78.  Direct examination of the colon should be repeated every 5-10 years through 61 years of age. However, you may need to be screened more often if  early forms of precancerous polyps or small growths are found. Skin Cancer  Check your skin from head to toe regularly.  Tell your health care provider about any new moles or changes in moles, especially if there is a change in a mole's shape or color.  Also tell your health care provider if you have a mole that is larger than the size of a pencil eraser.  Always use sunscreen. Apply sunscreen liberally and repeatedly throughout the day.  Protect yourself by wearing long sleeves, pants, a wide-brimmed hat, and sunglasses whenever you are outside. HEART DISEASE, DIABETES, AND HIGH BLOOD PRESSURE   High blood pressure causes heart disease and increases the risk of stroke. High blood pressure is more likely to develop in:  People who have blood pressure in the high end of the normal range (130-139/85-89 mm Hg).  People who are overweight or obese.  People who are  African American.  If you are 43-94 years of age, have your blood pressure checked every 3-5 years. If you are 44 years of age or older, have your blood pressure checked every year. You should have your blood pressure measured twice--once when you are at a hospital or clinic, and once when you are not at a hospital or clinic. Record the average of the two measurements. To check your blood pressure when you are not at a hospital or clinic, you can use:  An automated blood pressure machine at a pharmacy.  A home blood pressure monitor.  If you are between 57 years and 92 years old, ask your health care provider if you should take aspirin to prevent strokes.  Have regular diabetes screenings. This involves taking a blood sample to check your fasting blood sugar level.  If you are at a normal weight and have a low risk for diabetes, have this test once every three years after 61 years of age.  If you are overweight and have a high risk for diabetes, consider being tested at a younger age or more often. PREVENTING INFECTION  Hepatitis B  If you have a higher risk for hepatitis B, you should be screened for this virus. You are considered at high risk for hepatitis B if:  You were born in a country where hepatitis B is common. Ask your health care provider which countries are considered high risk.  Your parents were born in a high-risk country, and you have not been immunized against hepatitis B (hepatitis B vaccine).  You have HIV or AIDS.  You use needles to inject street drugs.  You live with someone who has hepatitis B.  You have had sex with someone who has hepatitis B.  You get hemodialysis treatment.  You take certain medicines for conditions, including cancer, organ transplantation, and autoimmune conditions. Hepatitis C  Blood testing is recommended for:  Everyone born from [birth year] through 1966.  Anyone with known risk factors for hepatitis C. Sexually transmitted infections  (STIs)  You should be screened for sexually transmitted infections (STIs) including gonorrhea and chlamydia if:  You are sexually active and are younger than 61 years of age.  You are older than 61 years of age and your health care provider tells you that you are at risk for this type of infection.  Your sexual activity has changed since you were last screened and you are at an increased risk for chlamydia or gonorrhea. Ask your health care provider if you are at risk.  If you do not have HIV, but  are at risk, it may be recommended that you take a prescription medicine daily to prevent HIV infection. This is called pre-exposure prophylaxis (PrEP). You are considered at risk if:  You are sexually active and do not regularly use condoms or know the HIV status of your partner(s).  You take drugs by injection.  You are sexually active with a partner who has HIV. Talk with your health care provider about whether you are at high risk of being infected with HIV. If you choose to begin PrEP, you should first be tested for HIV. You should then be tested every 3 months for as long as you are taking PrEP.  PREGNANCY   If you are premenopausal and you may become pregnant, ask your health care provider about preconception counseling.  If you may become pregnant, take 400 to 800 micrograms (mcg) of folic acid every day.  If you want to prevent pregnancy, talk to your health care provider about birth control (contraception). OSTEOPOROSIS AND MENOPAUSE   Osteoporosis is a disease in which the bones lose minerals and strength with aging. This can result in serious bone fractures. Your risk for osteoporosis can be identified using a bone density scan.  If you are 66 years of age or older, or if you are at risk for osteoporosis and fractures, ask your health care provider if you should be screened.  Ask your health care provider whether you should take a calcium or vitamin D supplement to lower your risk  for osteoporosis.  Menopause may have certain physical symptoms and risks.  Hormone replacement therapy may reduce some of these symptoms and risks. Talk to your health care provider about whether hormone replacement therapy is right for you.  HOME CARE INSTRUCTIONS   Schedule regular health, dental, and eye exams.  Stay current with your immunizations.   Do not use any tobacco products including cigarettes, chewing tobacco, or electronic cigarettes.  If you are pregnant, do not drink alcohol.  If you are breastfeeding, limit how much and how often you drink alcohol.  Limit alcohol intake to no more than 1 drink per day for nonpregnant women. One drink equals 12 ounces of beer, 5 ounces of wine, or 1 ounces of hard liquor.  Do not use street drugs.  Do not share needles.  Ask your health care provider for help if you need support or information about quitting drugs.  Tell your health care provider if you often feel depressed.  Tell your health care provider if you have ever been abused or do not feel safe at home.   This information is not intended to replace advice given to you by your health care provider. Make sure you discuss any questions you have with your health care provider.   Document Released: 03/07/2011 Document Revised: 09/12/2014 Document Reviewed: 07/24/2013 Elsevier Interactive Patient Education [employer].

## 2016-05-17 NOTE — Assessment & Plan Note (Signed)
Hx of left 2011 breast cancer; mammo diag already ordered, will put in orders for L and R [place]

## 2016-05-17 NOTE — Assessment & Plan Note (Signed)
Reviewed previous pap report from 2008; endocervical cells present, indicating presence of cervix and the need for ongoing monitoring; pap done today

## 2016-05-17 NOTE — Assessment & Plan Note (Signed)
USPSTF grade A and B recommendations reviewed with patient; age-appropriate recommendations, preventive care, screening tests, etc discussed and encouraged; healthy living encouraged; see AVS for patient education given to patient  

## 2016-05-17 NOTE — Progress Notes (Signed)
Patient ID: Patricia Fischer, female   DOB: [birth year], 60 y.o.   MRN: [id number]   Subjective:   Patricia Fischer is a 60 y.o. female here for a complete physical exam  Interim issues since last visit: saw ortho about pain in the leg; saw cardiologist and he wants me to run kidney function; he doesn't know why she needs cholesterol medicine  USPSTF grade A and B recommendations Alcohol: 1 a month Depression:  Depression screen [facility] 2/9 03/22/2016 11/10/2015 08/04/2015  Decreased Interest 0 0 2  Down, Depressed, Hopeless 0 1 1  PHQ - 2 Score 0 1 3  Altered sleeping - - 2  Tired, decreased energy - - 3  Change in appetite - - 2  Feeling bad or failure about yourself  - - 2  Trouble concentrating - - 1  Moving slowly or fidgety/restless - - 0  Suicidal thoughts - - 0  PHQ-9 Score - - 13  no depression right now Hypertension: controlled today Obesity: yes, see AVS Tobacco use: never HIV, hep B, hep C: already done STD testing and prevention (chl/gon/syphilis): declined Lipids: today, fasting Glucose: today, fasting Colorectal cancer: 01/16/2009; patient says NO MORE Breast cancer: mammo due BRCA gene screening: breast cancer, no testing, declined Intimate partner violence: no Cervical cancer screening:  S/p hysterectomy for noncancerous reasons Lung cancer: n/a Osteoporosis: normal; next DEXA will at 18 Fall prevention/vitamin D: out in the sun often, 1000 iu if dark, winter AAA: n/a Aspirin: on xarelto Diet: typical; trying to eat better; drinks mostly water, green veggies, no fried foods, eats at home ost of the time Exercise: not lately with health problems Skin cancer: no worrisome moles   Past Medical History:  Diagnosis Date  . Anxiety   . Asthma   . Blood transfusion [place]  . Breast cancer, left breast ([place]) 2011   DCIS  . Chronic migraine without aura, with intractable migraine, so stated, with status migrainosus March 2016  . Chronic systolic  CHF (congestive heart failure) ([place]) 05/05/2015   a. Dx 04/2015 - EF initially 25-30% with RV dysfunction, f/u echo 08/2015 technically difficult, EF 35-40%, anterior, anteroseptal, apical and inferoapical severe hypokinesis suggestive of LAD territory ischemia/infarct, grade 1 DD, mild MR, RV normal.  . Depression   . DVT (deep venous thrombosis) ([place]) Aug. 2016   a. Dx 04/2015 - patient returned 05/2015 after noncompliance with Xarelto.  . Environmental allergies   . Essential hypertension   . Family history of adverse reaction to anesthesia    " My Mother would get deathly sick "  . GERD (gastroesophageal reflux disease)   . Head injury 2011  . Hemorrhoids   . LBBB (left bundle branch block)   . Obesity   . Orthostatic hypotension   . Pneumonia 2012  . Saddle pulmonary embolus ([place]) Aug. 2016   a. Dx 04/2015 - patient returned 05/2015 after noncompliance with Xarelto.  . Shortness of breath dyspnea   . Syncope    a. Recurrent syncope in 2016 felt 2/2 orthostasis in setting of PE.   Past Surgical History:  Procedure Laterality Date  . ABDOMINAL HYSTERECTOMY  1986  . ANAL FISSURE REPAIR    . ANTERIOR CERVICAL DECOMP/DISCECTOMY FUSION  01/23/2012   Procedure: ANTERIOR CERVICAL DECOMPRESSION/DISCECTOMY FUSION 1 LEVEL;  Surgeon: Elaina Hoops, MD;  Location: [place] [facility];  Service: Neurosurgery;  Laterality: N/A;  Cervical five-six anterior cervical discectomy fusion with plating  . APPENDECTOMY    .  BREAST SURGERY Left 2011   Breast lumpectomy  . CARDIAC CATHETERIZATION  2010   [facility]; pt states it was "clear"  . CARDIAC CATHETERIZATION N/A 10/28/2015   Procedure: Right/Left Heart Cath and Coronary Angiography;  Surgeon: Belva Crome, MD;  Location: [place] [facility];  Service: Cardiovascular;  Laterality: N/A;  . CARPAL TUNNEL RELEASE Bilateral   . CESAREAN SECTION  1982, 1979  . CHOLECYSTECTOMY    . COLONOSCOPY  01/16/2009   Dr Bary Castilla  . KNEE CARTILAGE SURGERY Left   .  SHOULDER ARTHROSCOPY WITH SUBACROMIAL DECOMPRESSION, ROTATOR CUFF REPAIR AND BICEP TENDON REPAIR Right 11/22/2013   Procedure: RIGHT SHOULDER ATHROSCOPY OPEN SUBSCAP REPAIR DELTA-PECTORAL ;  Surgeon: Augustin Schooling, MD;  Location: [place] [facility];  Service: Orthopedics;  Laterality: Right;  . TARSAL TUNNEL RELEASE Left 1990  . TONSILLECTOMY    . TUBAL LIGATION     Family History  Problem Relation Age of Onset  . Breast cancer Paternal Aunt   . Breast cancer Mother   . Migraines Mother   . Cancer Mother   . Hypertension Mother   . Rectal cancer Father   . Cancer Father   . COPD Father   . Heart disease Paternal Grandmother   . Stroke Paternal Grandmother   . Heart attack Paternal Grandmother   . Aneurysm Paternal Grandfather   . COPD Paternal Grandfather   . Anesthesia problems Neg Hx   . Diabetes Neg Hx    Social History  Substance Use Topics  . Smoking status: Never Smoker  . Smokeless tobacco: Never Used  . Alcohol use No     Comment: occasional wine   Review of Systems  Objective:   Vitals:   05/17/16 1357  BP: 132/86  Pulse: 97  Resp: 16  Temp: 99.2 F (37.3 C)  TempSrc: Oral  SpO2: 97%  Weight: 230 lb 11.2 oz (104.6 kg)   Body mass index is 38.39 kg/m. Wt Readings from Last 3 Encounters:  05/17/16 230 lb 11.2 oz (104.6 kg)  05/12/16 229 lb 12.8 oz (104.2 kg)  03/22/16 227 lb (103 kg)   Physical Exam  Constitutional: She appears well-developed and well-nourished.  HENT:  Head: Normocephalic and atraumatic.  Eyes: Conjunctivae and EOM are normal. Right eye exhibits no hordeolum. Left eye exhibits no hordeolum. No scleral icterus.  Neck: Carotid bruit is not present. No thyromegaly present.  Cardiovascular: Normal rate, regular rhythm, S1 normal, S2 normal and normal heart sounds.   No extrasystoles are present.  Pulmonary/Chest: Effort normal and breath sounds normal. No respiratory distress. Right breast exhibits no inverted nipple, no mass, no nipple discharge,  no skin change and no tenderness. Left breast exhibits no inverted nipple, no mass, no nipple discharge, no skin change and no tenderness. Breasts are symmetrical.  Abdominal: Soft. Normal appearance and bowel sounds are normal. She exhibits no distension, no abdominal bruit, no pulsatile midline mass and no mass. There is no hepatosplenomegaly. There is no tenderness. No hernia.  Genitourinary: Uterus normal. Pelvic exam was performed with patient prone. There is no rash or lesion on the right labia. There is no rash or lesion on the left labia. Cervix exhibits no motion tenderness. Right adnexum displays no mass, no tenderness and no fullness. Left adnexum displays no mass, no tenderness and no fullness.  Musculoskeletal: Normal range of motion. She exhibits no edema.  Lymphadenopathy:       Head (right side): No submandibular adenopathy present.  Head (left side): No submandibular adenopathy present.    She has no cervical adenopathy.    She has no axillary adenopathy.  Neurological: She is alert. She displays no tremor. No cranial nerve deficit. She exhibits normal muscle tone. Gait normal.  Skin: Skin is warm and dry. No bruising and no ecchymosis noted. No cyanosis. No pallor.  Keratotic, verrucous lesion on leg  Psychiatric: Her speech is normal and behavior is normal. Thought content normal. Her mood appears not anxious. She does not exhibit a depressed mood.   Assessment/Plan:   Problem List Items Addressed This Visit      Other   Preventative health care - Primary    USPSTF grade A and B recommendations reviewed with patient; age-appropriate recommendations, preventive care, screening tests, etc discussed and encouraged; healthy living encouraged; see AVS for patient education given to patient      Relevant Orders   CBC with Differential/Platelet (Completed)   Lipid panel (Completed)   COMPLETE METABOLIC PANEL WITH GFR (Completed)   Hx of breast cancer    Hx of left 2011  breast cancer; mammo diag already ordered, will put in orders for L and R [place]      Relevant Orders   US BREAST LTD UNI LEFT INC AXILLA   US BREAST LTD UNI RIGHT INC AXILLA   Cervical cancer screening    Reviewed previous pap report from 2008; endocervical cells present, indicating presence of cervix and the need for ongoing monitoring; pap done today      Relevant Orders   Pap, ThinPrep rflx HPV    Other Visit Diagnoses    Needs flu shot       Relevant Orders   Flu Vaccine QUAD 36+ mos PF IM (Fluarix & Fluzone Quad PF) (Completed)   Need for diphtheria-tetanus-pertussis (Tdap) vaccine       Relevant Orders   Tdap vaccine greater than or equal to 7yo IM (Completed)      No orders of the defined types were placed in this encounter.  Orders Placed This Encounter  Procedures  . US BREAST LTD UNI LEFT INC AXILLA    Scheduling Instructions:     Already ordered diag mammo 04/08/16    Order Specific Question:   Reason for Exam (SYMPTOM  OR DIAGNOSIS REQUIRED)    Answer:   hx of LEFT breast cancer    Order Specific Question:   Preferred imaging location?    Answer:   [facility]  . US BREAST LTD UNI RIGHT INC AXILLA    Order Specific Question:   Reason for Exam (SYMPTOM  OR DIAGNOSIS REQUIRED)    Answer:   LEFT breast cancer 2011    Order Specific Question:   Preferred imaging location?    Answer:   [facility]  . Flu Vaccine QUAD 36+ mos PF IM (Fluarix & Fluzone Quad PF)  . Tdap vaccine greater than or equal to 7yo IM  . CBC with Differential/Platelet  . Lipid panel  . COMPLETE METABOLIC PANEL WITH GFR    Follow up plan: Return in about 1 year (around 05/17/2017) for complete physical.  An After Visit Summary was printed and given to the patient.

## 2016-05-18 LAB — CBC WITH DIFFERENTIAL/PLATELET
BASOS ABS: 0 {cells}/uL (ref 0–200)
Basophils Relative: 0 %
EOS PCT: 4 %
Eosinophils Absolute: 292 cells/uL (ref 15–500)
HCT: 37.8 % (ref 35.0–45.0)
Hemoglobin: 12.4 g/dL (ref 11.7–15.5)
Lymphocytes Relative: 31 %
Lymphs Abs: 2263 cells/uL (ref 850–3900)
MCH: 27 pg (ref 27.0–33.0)
MCHC: 32.8 g/dL (ref 32.0–36.0)
MCV: 82.4 fL (ref 80.0–100.0)
MONOS PCT: 7 %
MPV: 10.4 fL (ref 7.5–12.5)
Monocytes Absolute: 511 cells/uL (ref 200–950)
NEUTROS PCT: 58 %
Neutro Abs: 4234 cells/uL (ref 1500–7800)
PLATELETS: 203 10*3/uL (ref 140–400)
RBC: 4.59 MIL/uL (ref 3.80–5.10)
RDW: 15.3 % — AB (ref 11.0–15.0)
WBC: 7.3 10*3/uL (ref 3.8–10.8)

## 2016-05-18 LAB — COMPLETE METABOLIC PANEL WITH GFR
ALBUMIN: 4 g/dL (ref 3.6–5.1)
ALK PHOS: 108 U/L (ref 33–130)
ALT: 14 U/L (ref 6–29)
AST: 19 U/L (ref 10–35)
BILIRUBIN TOTAL: 0.7 mg/dL (ref 0.2–1.2)
BUN: 9 mg/dL (ref 7–25)
CALCIUM: 9.7 mg/dL (ref 8.6–10.4)
CHLORIDE: 103 mmol/L (ref 98–110)
CO2: 27 mmol/L (ref 20–31)
CREATININE: 0.82 mg/dL (ref 0.50–0.99)
GFR, Est African American: 89 mL/min (ref >=60)
GFR, Est Non African American: 77 mL/min (ref >=60)
Glucose, Bld: 89 mg/dL (ref 65–99)
Potassium: 3.9 mmol/L (ref 3.5–5.3)
Sodium: 141 mmol/L (ref 135–146)
TOTAL PROTEIN: 7.3 g/dL (ref 6.1–8.1)

## 2016-05-18 LAB — LIPID PANEL
CHOLESTEROL: 125 mg/dL (ref 125–200)
HDL: 51 mg/dL (ref >=46)
LDL Cholesterol: 56 mg/dL (ref <130)
Total CHOL/HDL Ratio: 2.5 Ratio (ref <=5.0)
Triglycerides: 92 mg/dL (ref <150)
VLDL: 18 mg/dL (ref <30)

## 2016-05-18 LAB — PAP, THINPREP RFLX HPV

## 2016-05-19 ENCOUNTER — Other Ambulatory Visit: Payer: Self-pay | Admitting: Family Medicine

## 2016-05-19 DIAGNOSIS — Z124 Encounter for screening for malignant neoplasm of cervix: Secondary | ICD-10-CM

## 2016-05-19 NOTE — Progress Notes (Signed)
Should have had HPV testing done Entered new order just in case

## 2016-05-19 NOTE — Assessment & Plan Note (Signed)
Pap smear

## 2016-05-20 ENCOUNTER — Other Ambulatory Visit: Payer: Self-pay

## 2016-05-20 DIAGNOSIS — Z124 Encounter for screening for malignant neoplasm of cervix: Secondary | ICD-10-CM

## 2016-05-31 ENCOUNTER — Encounter: Payer: Self-pay | Admitting: Family Medicine

## 2016-06-05 HISTORY — PX: BREAST BIOPSY: SHX20

## 2016-06-06 ENCOUNTER — Telehealth: Payer: Self-pay

## 2016-06-06 ENCOUNTER — Ambulatory Visit: Admission: RE | Admit: 2016-06-06 | Payer: Medicare Other | Source: Ambulatory Visit

## 2016-06-06 ENCOUNTER — Ambulatory Visit
Admission: RE | Admit: 2016-06-06 | Discharge: 2016-06-06 | Disposition: A | Discharge status: Home / Self Care | Payer: Medicare Other | Source: Ambulatory Visit | Attending: Family Medicine | Admitting: Family Medicine

## 2016-06-06 DIAGNOSIS — Z853 Personal history of malignant neoplasm of breast: Secondary | ICD-10-CM | POA: Insufficient documentation

## 2016-06-06 DIAGNOSIS — R928 Other abnormal and inconclusive findings on diagnostic imaging of breast: Secondary | ICD-10-CM | POA: Diagnosis not present

## 2016-06-06 HISTORY — DX: Malignant neoplasm of unspecified site of unspecified female breast: C50.919

## 2016-06-06 NOTE — Telephone Encounter (Signed)
Prior auth for Xarelto 20mg  obtained from [facility]. [place] [id number].

## 2016-06-08 ENCOUNTER — Encounter: Payer: Self-pay | Admitting: Family Medicine

## 2016-06-08 ENCOUNTER — Other Ambulatory Visit: Payer: Self-pay | Admitting: Family Medicine

## 2016-06-08 DIAGNOSIS — R928 Other abnormal and inconclusive findings on diagnostic imaging of breast: Secondary | ICD-10-CM

## 2016-06-08 DIAGNOSIS — Z5181 Encounter for therapeutic drug level monitoring: Secondary | ICD-10-CM | POA: Insufficient documentation

## 2016-06-08 NOTE — Progress Notes (Signed)
I didn't give any verbal orders with readback (??) for clip placement and additional images for this patient; these orders aren't mine I reviewed the last mammogram report from 06/06/16 Are these orders perhaps supposed to be on another patient? Please clarify with radiology

## 2016-06-09 ENCOUNTER — Ambulatory Visit
Admission: RE | Admit: 2016-06-09 | Discharge: 2016-06-09 | Disposition: A | Discharge status: Home / Self Care | Payer: Medicare Other | Source: Ambulatory Visit | Attending: Family Medicine | Admitting: Family Medicine

## 2016-06-09 ENCOUNTER — Other Ambulatory Visit: Payer: Self-pay | Admitting: Family Medicine

## 2016-06-09 DIAGNOSIS — R928 Other abnormal and inconclusive findings on diagnostic imaging of breast: Secondary | ICD-10-CM

## 2016-06-09 DIAGNOSIS — N6320 Unspecified lump in the left breast, unspecified quadrant: Secondary | ICD-10-CM | POA: Diagnosis not present

## 2016-06-09 DIAGNOSIS — N6002 Solitary cyst of left breast: Secondary | ICD-10-CM | POA: Diagnosis not present

## 2016-06-09 DIAGNOSIS — N6322 Unspecified lump in the left breast, upper inner quadrant: Secondary | ICD-10-CM | POA: Diagnosis not present

## 2016-06-17 ENCOUNTER — Other Ambulatory Visit: Payer: Self-pay | Admitting: Interventional Cardiology

## 2016-06-22 ENCOUNTER — Ambulatory Visit
Admission: RE | Admit: 2016-06-22 | Discharge: 2016-06-22 | Disposition: A | Discharge status: Home / Self Care | Payer: Medicare Other | Source: Ambulatory Visit | Attending: Family Medicine | Admitting: Family Medicine

## 2016-06-22 DIAGNOSIS — N6321 Unspecified lump in the left breast, upper outer quadrant: Secondary | ICD-10-CM | POA: Diagnosis not present

## 2016-06-22 DIAGNOSIS — N6012 Diffuse cystic mastopathy of left breast: Secondary | ICD-10-CM | POA: Diagnosis not present

## 2016-06-22 DIAGNOSIS — R928 Other abnormal and inconclusive findings on diagnostic imaging of breast: Secondary | ICD-10-CM

## 2016-06-22 DIAGNOSIS — N6322 Unspecified lump in the left breast, upper inner quadrant: Secondary | ICD-10-CM | POA: Diagnosis not present

## 2016-06-22 DIAGNOSIS — N6002 Solitary cyst of left breast: Secondary | ICD-10-CM | POA: Diagnosis not present

## 2016-06-23 LAB — SURGICAL PATHOLOGY

## 2016-07-24 IMAGING — DX DG CHEST 2V
2 series · 2 of 2 positions shown · non-contrast
Comparison: 10/06/2015

CLINICAL DATA: Shortness of breath.  Chest pain on the left

EXAM:
CHEST  2 VIEW

[chest pa]
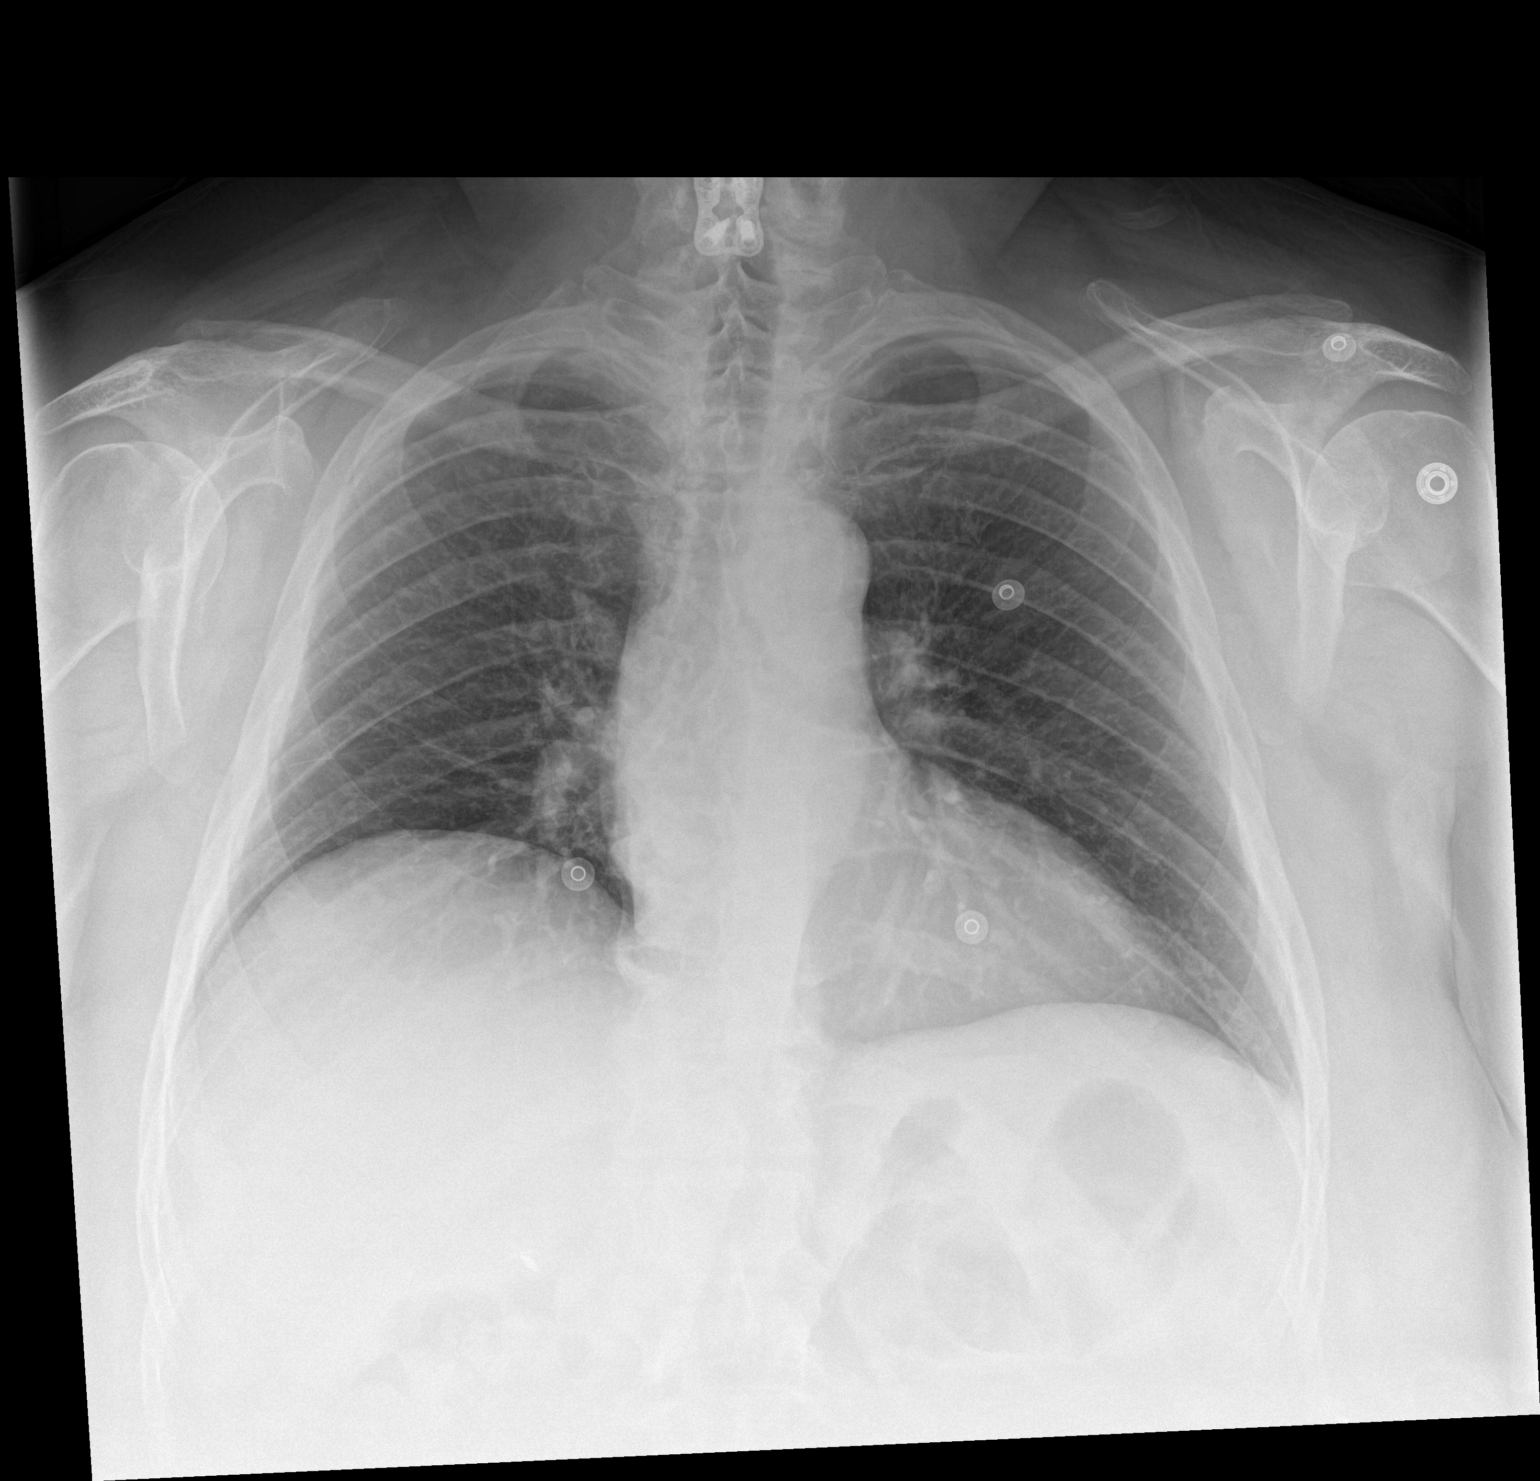

[chest lat]
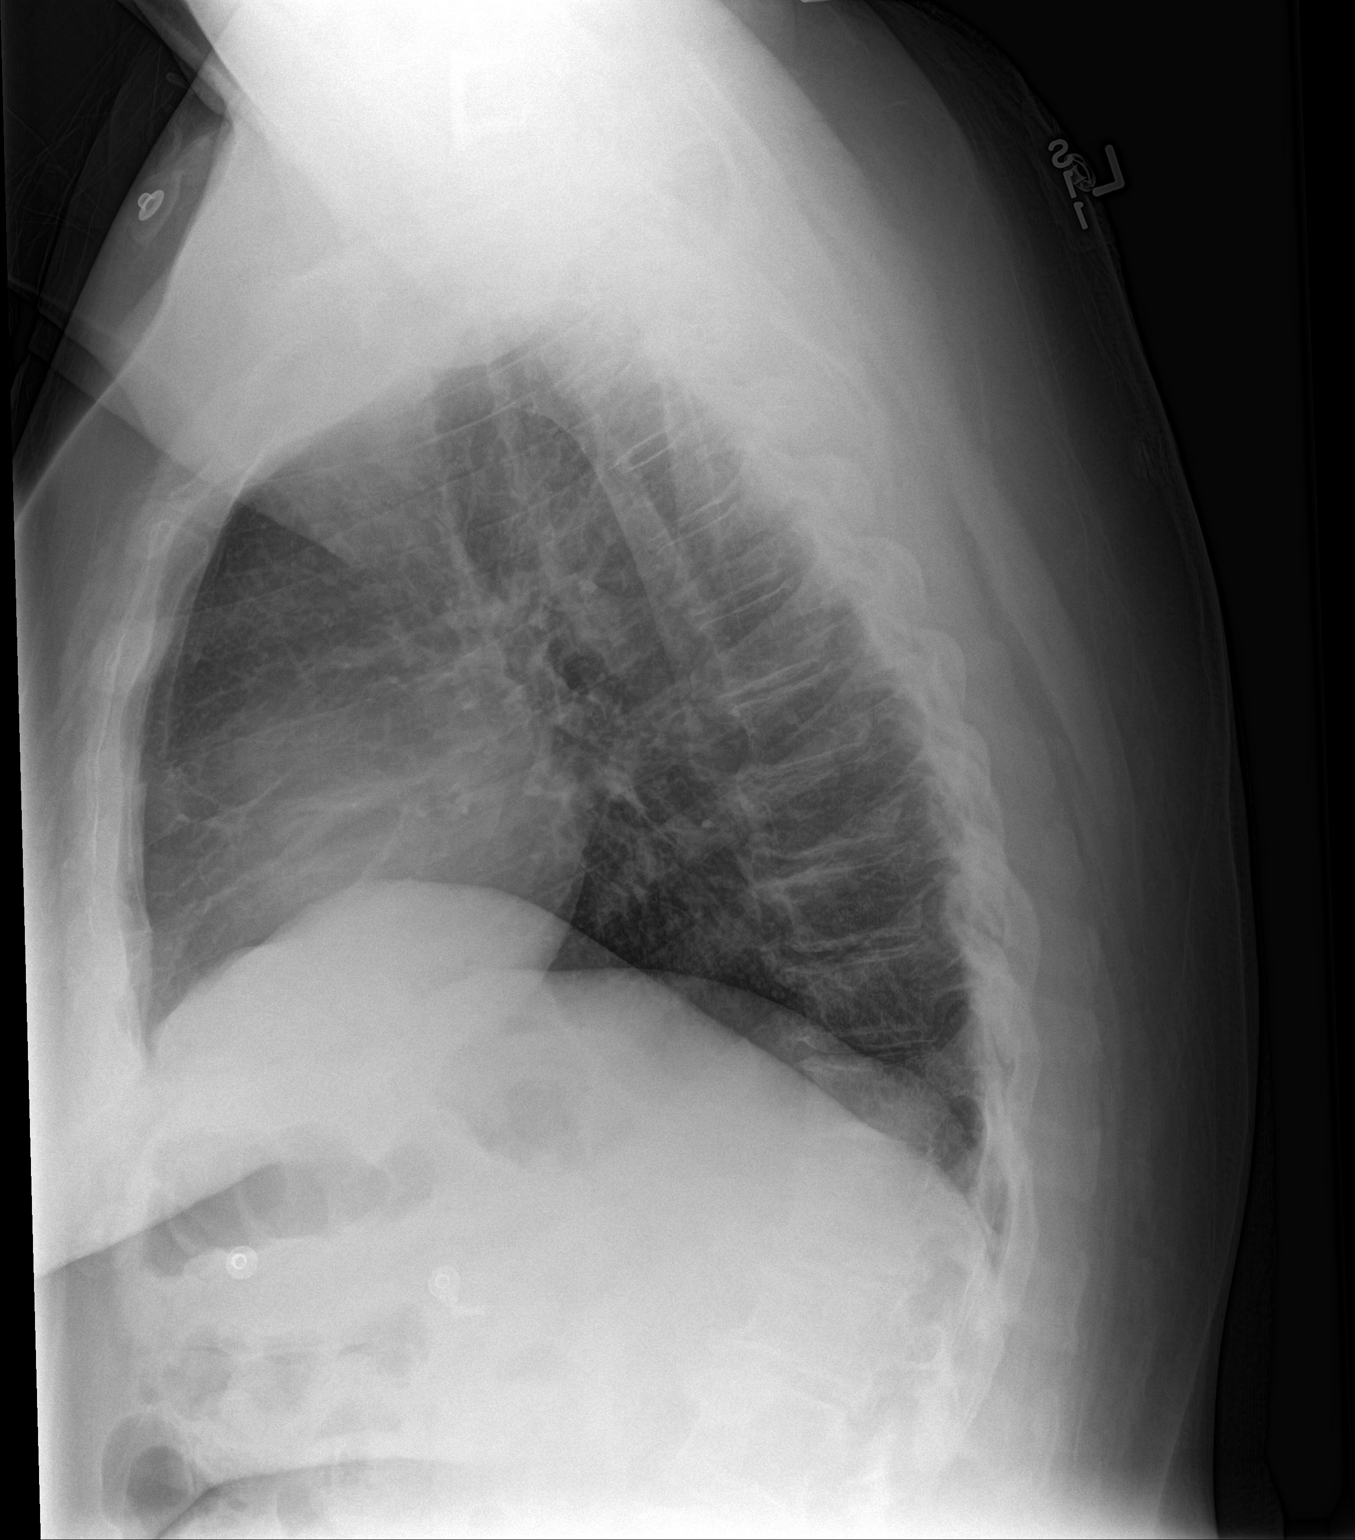

[2 of 2 positions shown; findings below may reference images not displayed]

FINDINGS: Normal heart size and stable aortic tortuosity.  Negative hila.

Chronic elevation of the right diaphragm.

There is no edema, consolidation, effusion, or pneumothorax.
Cholecystectomy clips. No acute osseous finding.
IMPRESSION: Stable.  No acute finding.

## 2016-07-27 ENCOUNTER — Other Ambulatory Visit: Payer: Self-pay | Admitting: Family Medicine

## 2016-07-27 ENCOUNTER — Encounter: Payer: Self-pay | Admitting: Family Medicine

## 2016-07-27 ENCOUNTER — Ambulatory Visit (INDEPENDENT_AMBULATORY_CARE_PROVIDER_SITE_OTHER): Payer: Medicare Other | Admitting: Family Medicine

## 2016-07-27 ENCOUNTER — Telehealth: Payer: Self-pay | Admitting: Family Medicine

## 2016-07-27 ENCOUNTER — Ambulatory Visit
Admission: RE | Admit: 2016-07-27 | Discharge: 2016-07-27 | Disposition: A | Discharge status: Home / Self Care | Payer: Medicare Other | Source: Ambulatory Visit | Attending: Family Medicine | Admitting: Family Medicine

## 2016-07-27 ENCOUNTER — Other Ambulatory Visit
Admission: RE | Admit: 2016-07-27 | Discharge: 2016-07-27 | Disposition: A | Discharge status: Home / Self Care | Payer: Medicare Other | Source: Ambulatory Visit | Attending: Family Medicine | Admitting: Family Medicine

## 2016-07-27 VITALS — BP 130/82 | HR 97 | Temp 98.9°F | Resp 14 | Wt 232.0 lb

## 2016-07-27 DIAGNOSIS — R109 Unspecified abdominal pain: Secondary | ICD-10-CM

## 2016-07-27 DIAGNOSIS — R310 Gross hematuria: Secondary | ICD-10-CM | POA: Diagnosis not present

## 2016-07-27 DIAGNOSIS — N3001 Acute cystitis with hematuria: Secondary | ICD-10-CM | POA: Insufficient documentation

## 2016-07-27 DIAGNOSIS — R300 Dysuria: Secondary | ICD-10-CM | POA: Diagnosis not present

## 2016-07-27 DIAGNOSIS — Z90710 Acquired absence of both cervix and uterus: Secondary | ICD-10-CM | POA: Insufficient documentation

## 2016-07-27 DIAGNOSIS — Z9049 Acquired absence of other specified parts of digestive tract: Secondary | ICD-10-CM | POA: Diagnosis not present

## 2016-07-27 DIAGNOSIS — R319 Hematuria, unspecified: Secondary | ICD-10-CM

## 2016-07-27 DIAGNOSIS — K5730 Diverticulosis of large intestine without perforation or abscess without bleeding: Secondary | ICD-10-CM | POA: Insufficient documentation

## 2016-07-27 DIAGNOSIS — R10A2 Flank pain, left side: Secondary | ICD-10-CM

## 2016-07-27 DIAGNOSIS — N3000 Acute cystitis without hematuria: Secondary | ICD-10-CM | POA: Insufficient documentation

## 2016-07-27 DIAGNOSIS — I7090 Unspecified atherosclerosis: Secondary | ICD-10-CM | POA: Diagnosis not present

## 2016-07-27 LAB — BASIC METABOLIC PANEL
Anion gap: 8 (ref 5–15)
BUN: 9 mg/dL (ref 6–20)
CO2: 26 mmol/L (ref 22–32)
CREATININE: 0.72 mg/dL (ref 0.44–1.00)
Calcium: 9.1 mg/dL (ref 8.9–10.3)
Chloride: 104 mmol/L (ref 101–111)
GFR calc Af Amer: >60 mL/min (ref >60)
Glucose, Bld: 105 mg/dL — ABNORMAL HIGH (ref 65–99)
Potassium: 4.2 mmol/L (ref 3.5–5.1)
SODIUM: 138 mmol/L (ref 135–145)

## 2016-07-27 LAB — CBC WITH DIFFERENTIAL/PLATELET
BASOS ABS: 0 10*3/uL (ref 0–0.1)
Basophils Relative: 1 %
EOS ABS: 0.2 10*3/uL (ref 0–0.7)
EOS PCT: 2 %
HCT: 39.1 % (ref 35.0–47.0)
Hemoglobin: 13 g/dL (ref 12.0–16.0)
Lymphocytes Relative: 29 %
Lymphs Abs: 2.5 10*3/uL (ref 1.0–3.6)
MCH: 27.2 pg (ref 26.0–34.0)
MCHC: 33.3 g/dL (ref 32.0–36.0)
MCV: 81.6 fL (ref 80.0–100.0)
Monocytes Absolute: 0.6 10*3/uL (ref 0.2–0.9)
Monocytes Relative: 7 %
Neutro Abs: 5.5 10*3/uL (ref 1.4–6.5)
Neutrophils Relative %: 61 %
PLATELETS: 172 10*3/uL (ref 150–440)
RBC: 4.79 MIL/uL (ref 3.80–5.20)
RDW: 14.8 % — ABNORMAL HIGH (ref 11.5–14.5)
WBC: 8.9 10*3/uL (ref 3.6–11.0)

## 2016-07-27 LAB — POCT URINALYSIS DIPSTICK
BILIRUBIN UA: NEGATIVE
Glucose, UA: NEGATIVE
KETONES UA: NEGATIVE
PH UA: 6.5
PROTEIN UA: NEGATIVE
RBC UA: POSITIVE
Spec Grav, UA: 1.01
Urobilinogen, UA: 0.2

## 2016-07-27 MED ORDER — CIPROFLOXACIN HCL 500 MG PO TABS: 500.0000 mg | ORAL_TABLET | Freq: Two times a day (BID) | ORAL | 0 refills | Status: AC

## 2016-07-27 MED ORDER — FLUCONAZOLE 150 MG PO TABS: 150.0000 mg | ORAL_TABLET | Freq: Once | ORAL | 0 refills | Status: AC

## 2016-07-27 MED ORDER — IOPAMIDOL (ISOVUE-370) INJECTION 76%
100.0000 mL | Freq: Once | INTRAVENOUS | Status: AC | PRN
  Administered 2016-07-27: 100 mL via INTRAVENOUS

## 2016-07-27 NOTE — Progress Notes (Signed)
I spoke with patient about CT results

## 2016-07-27 NOTE — Assessment & Plan Note (Signed)
Cbc and BMP (with creatinine) STAT

## 2016-07-27 NOTE — Assessment & Plan Note (Addendum)
Rule-out pyelo/abscess; start cipro 500 BID; culture pending; STAT CT scan now; confirmed with Suezanne Jacquet best test for not only stone, but also hydroureter, abscess

## 2016-07-27 NOTE — Progress Notes (Signed)
New orders placed that say "STAT" on them

## 2016-07-27 NOTE — Progress Notes (Signed)
BP 130/82   Pulse 97   Temp 98.9 F (37.2 C) (Oral)   Resp 14   Wt 232 lb (105.2 kg)   SpO2 94%   BMI 38.61 kg/m    Subjective:    Patient ID: Patricia Fischer, female    DOB: [birth year], 60 y.o.   MRN: [id number]  HPI: Patricia Fischer is a 60 y.o. female  Chief Complaint  Patient presents with  . Urinary Tract Infection    several days symptoms include lowe back and abdominal pain, frequency, hematuria and buring   She started to feel bad on Sunday Passing blood in her urine Was drinking soft drinks this past weekend Patient feels chilled Urine "stinks" Urine leaking, dripping Some trouble getting urine stream started, worse yesterday Lower back pain and left flank pain Hx of kidney stone a long time ago No nausea or vomiting Good water drinker; did have dark soft drinks this past weekend Hx of one kidney stone in the past, remotely On blood thinner  Depression screen [facility] 2/9 07/27/2016 03/22/2016 11/10/2015 08/04/2015  Decreased Interest 0 0 0 2  Down, Depressed, Hopeless 0 0 1 1  PHQ - 2 Score 0 0 1 3  Altered sleeping - - - 2  Tired, decreased energy - - - 3  Change in appetite - - - 2  Feeling bad or failure about yourself  - - - 2  Trouble concentrating - - - 1  Moving slowly or fidgety/restless - - - 0  Suicidal thoughts - - - 0  PHQ-9 Score - - - 13   Relevant past medical, surgical, family and social history reviewed Past Medical History:  Diagnosis Date  . Anxiety   . Asthma   . Blood transfusion [place]  . Breast cancer ([place]) 04/26/2008   left breast  . Breast cancer, left breast ([place]) 2011   DCIS  . Chronic migraine without aura, with intractable migraine, so stated, with status migrainosus March 2016  . Chronic systolic CHF (congestive heart failure) ([place]) 05/05/2015   a. Dx 04/2015 - EF initially 25-30% with RV dysfunction, f/u echo 08/2015 technically difficult, EF 35-40%, anterior, anteroseptal, apical and inferoapical severe  hypokinesis suggestive of LAD territory ischemia/infarct, grade 1 DD, mild MR, RV normal.  . Depression   . DVT (deep venous thrombosis) ([place]) Aug. 2016   a. Dx 04/2015 - patient returned 05/2015 after noncompliance with Xarelto.  . Environmental allergies   . Essential hypertension   . Family history of adverse reaction to anesthesia    " My Mother would get deathly sick "  . GERD (gastroesophageal reflux disease)   . Head injury 2011  . Hemorrhoids   . LBBB (left bundle branch block)   . Obesity   . Orthostatic hypotension   . Pneumonia 2012  . 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 Dx 04/2015 - patient returned 05/2015 after noncompliance with Xarelto.  . Shortness of breath dyspnea   . Syncope    a. Recurrent syncope in 2016 felt 2/2 orthostasis in setting of PE.   Past Surgical History:  Procedure Laterality Date  . ABDOMINAL HYSTERECTOMY  1986  . ANAL FISSURE REPAIR    . ANTERIOR CERVICAL DECOMP/DISCECTOMY FUSION  01/23/2012   Procedure: ANTERIOR CERVICAL DECOMPRESSION/DISCECTOMY FUSION 1 LEVEL;  Surgeon: Elaina Hoops, MD;  Location: [place] [facility];  Service: Neurosurgery;  Laterality: N/A;  Cervical five-six anterior cervical discectomy fusion with plating  . APPENDECTOMY    .  BREAST SURGERY Left 2011   Breast lumpectomy  . CARDIAC CATHETERIZATION  2010   [facility]; pt states it was "clear"  . CARDIAC CATHETERIZATION N/A 10/28/2015   Procedure: Right/Left Heart Cath and Coronary Angiography;  Surgeon: Belva Crome, MD;  Location: [place] [facility];  Service: Cardiovascular;  Laterality: N/A;  . CARPAL TUNNEL RELEASE Bilateral   . CESAREAN SECTION  1982, 1979  . CHOLECYSTECTOMY    . COLONOSCOPY  01/16/2009   Dr Bary Castilla  . KNEE CARTILAGE SURGERY Left   . SHOULDER ARTHROSCOPY WITH SUBACROMIAL DECOMPRESSION, ROTATOR CUFF REPAIR AND BICEP TENDON REPAIR Right 11/22/2013   Procedure: RIGHT SHOULDER ATHROSCOPY OPEN SUBSCAP REPAIR DELTA-PECTORAL ;  Surgeon: Augustin Schooling, MD;  Location: [place];  Service: Orthopedics;  Laterality: Right;  . TARSAL TUNNEL RELEASE Left 1990  . TONSILLECTOMY    . TUBAL LIGATION     Family History  Problem Relation Age of Onset  . Breast cancer Paternal Aunt   . Breast cancer Mother 35  . Migraines Mother   . Cancer Mother   . Hypertension Mother   . Rectal cancer Father   . Cancer Father   . COPD Father   . Heart disease Paternal Grandmother   . Stroke Paternal Grandmother   . Heart attack Paternal Grandmother   . Aneurysm Paternal Grandfather   . COPD Paternal Grandfather   . Breast cancer Cousin   . Breast cancer Cousin   . Breast cancer Cousin   . Anesthesia problems Neg Hx   . Diabetes Neg Hx    Social History  Substance Use Topics  . Smoking status: Never Smoker  . Smokeless tobacco: Never Used  . Alcohol use No     Comment: occasional wine   Interim medical history since last visit reviewed. Allergies and medications reviewed  Review of Systems Per HPI unless specifically indicated above     Objective:    BP 130/82   Pulse 97   Temp 98.9 F (37.2 C) (Oral)   Resp 14   Wt 232 lb (105.2 kg)   SpO2 94%   BMI 38.61 kg/m   Wt Readings from Last 3 Encounters:  07/27/16 232 lb (105.2 kg)  05/17/16 230 lb 11.2 oz (104.6 kg)  05/12/16 229 lb 12.8 oz (104.2 kg)    Physical Exam  Constitutional: She appears well-developed and well-nourished. No distress.  HENT:  Head: Normocephalic and atraumatic.  Eyes: EOM are normal. No scleral icterus.  Neck: No thyromegaly present.  Cardiovascular: Normal rate, regular rhythm and normal heart sounds.   No murmur heard. Pulmonary/Chest: Effort normal and breath sounds normal. No respiratory distress. She has no wheezes.  Abdominal: Soft. Normal appearance and bowel sounds are normal. She exhibits no distension. There is tenderness in the suprapubic area and left lower quadrant. There is guarding and CVA tenderness (LEFT).  Musculoskeletal: Normal  range of motion. She exhibits no edema.  Neurological: She is alert. She exhibits normal muscle tone.  Skin: Skin is warm and dry. She is not diaphoretic. No pallor.  Psychiatric: She has a normal mood and affect. Her behavior is normal. Judgment and thought content normal. Her affect is not blunt.   Results for orders placed or performed in visit on 07/27/16  POCT urinalysis dipstick  Result Value Ref Range   Color, UA red    Clarity, UA cloudy    Glucose, UA neg    Bilirubin, UA neg    Ketones, UA neg  Spec Grav, UA 1.010    Blood, UA positive    pH, UA 6.5    Protein, UA neg    Urobilinogen, UA 0.2    Nitrite, UA trace    Leukocytes, UA large (3+) (A) Negative      Assessment & Plan:   Problem List Items Addressed This Visit      Genitourinary   Acute cystitis    Rule-out pyelo/abscess; start cipro 500 BID; culture pending; STAT CT scan now; confirmed with Suezanne Jacquet best test for not only stone, but also hydroureter, abscess      Relevant Orders   Urine Culture   CBC with Differential/Platelet   CT ABDOMEN PELVIS W CONTRAST     Other   Hematuria - Primary    May be hemorrhagic cystitis, but may have stone, complicated by hydroureter; will get stat CT abd/pelvic (CT urogram)      Relevant Orders   BASIC METABOLIC PANEL WITH GFR   CT ABDOMEN PELVIS W CONTRAST   POCT urinalysis dipstick (Completed)   CT ABDOMEN PELVIS W CONTRAST   Acute left flank pain    Start cipro 500 BID; get stat CT scan; to urologist if needed; to ER over weekend if worse; check BMP as well      Relevant Orders   BASIC METABOLIC PANEL WITH GFR   CBC with Differential/Platelet   Basic Metabolic Panel (BMET)   CT ABDOMEN PELVIS W CONTRAST    Other Visit Diagnoses    Burning with urination       Relevant Orders   POCT urinalysis dipstick (Completed)   CT ABDOMEN PELVIS W CONTRAST      Follow up plan: No Follow-up on file.  An after-visit summary was printed and given to the patient at  [place].  Please see the patient instructions which may contain other information and recommendations beyond what is mentioned above in the assessment and plan.  Meds ordered this encounter  Medications  . ciprofloxacin (CIPRO) 500 MG tablet    Sig: Take 1 tablet (500 mg total) by mouth 2 (two) times daily.    Dispense:  14 tablet    Refill:  0  . fluconazole (DIFLUCAN) 150 MG tablet    Sig: Take 1 tablet (150 mg total) by mouth once.    Dispense:  1 tablet    Refill:  0    Orders Placed This Encounter  Procedures  . Urine Culture  . CT ABDOMEN PELVIS W CONTRAST  . BASIC METABOLIC PANEL WITH GFR  . CBC with Differential/Platelet  . Basic Metabolic Panel (BMET)  . POCT urinalysis dipstick  I verified with patient and my staff that I want her creatinine checked BEFORE she gets her IV contrast  Patient's cell phone [phone]

## 2016-07-27 NOTE — Assessment & Plan Note (Addendum)
Start cipro 500 BID; get stat CT scan; to urologist if needed; to ER over weekend if worse; check BMP as well

## 2016-07-27 NOTE — Telephone Encounter (Signed)
Advised to come in today at 11:40 for UTI

## 2016-07-27 NOTE — Telephone Encounter (Signed)
Pt states she would like a call back. She thinks she has a UTI. Please return her call.

## 2016-07-27 NOTE — Assessment & Plan Note (Signed)
May be hemorrhagic cystitis, but may have stone, complicated by hydroureter; will get stat CT abd/pelvic (CT urogram)

## 2016-07-27 NOTE — Patient Instructions (Addendum)
Do not take tizanidine for the next week while on antibiotics Go from here to the hospital for the CT scan Start the antibiotics Stay hydrated Do not leave the hospital until we talk about the results Go to the ER if your pain worsens, you cannot urinate, you develop fever, nausea/vomiting, or feel worse

## 2016-07-28 LAB — URINE CULTURE: Organism ID, Bacteria: NO GROWTH

## 2016-08-03 ENCOUNTER — Other Ambulatory Visit: Payer: Self-pay | Admitting: Interventional Cardiology

## 2016-08-04 ENCOUNTER — Ambulatory Visit (INDEPENDENT_AMBULATORY_CARE_PROVIDER_SITE_OTHER): Payer: Medicare Other | Admitting: Family Medicine

## 2016-08-04 ENCOUNTER — Telehealth: Payer: Self-pay | Admitting: Family Medicine

## 2016-08-04 ENCOUNTER — Encounter: Payer: Self-pay | Admitting: Family Medicine

## 2016-08-04 VITALS — BP 122/78 | HR 77 | Temp 98.7°F | Resp 14 | Wt 231.1 lb

## 2016-08-04 DIAGNOSIS — R109 Unspecified abdominal pain: Secondary | ICD-10-CM

## 2016-08-04 DIAGNOSIS — R310 Gross hematuria: Secondary | ICD-10-CM | POA: Diagnosis not present

## 2016-08-04 DIAGNOSIS — N3090 Cystitis, unspecified without hematuria: Secondary | ICD-10-CM

## 2016-08-04 LAB — POCT URINALYSIS DIPSTICK
BILIRUBIN UA: NEGATIVE
Blood, UA: NEGATIVE
Glucose, UA: NEGATIVE
Ketones, UA: NEGATIVE
LEUKOCYTES UA: NEGATIVE
NITRITE UA: NEGATIVE
PH UA: 6
Protein, UA: NEGATIVE
Spec Grav, UA: 1.025
Urobilinogen, UA: 0.2

## 2016-08-04 MED ORDER — NITROFURANTOIN MONOHYD MACRO 100 MG PO CAPS
100.0000 mg | ORAL_CAPSULE | Freq: Two times a day (BID) | ORAL | 0 refills | Status: AC
Start: 2016-08-04 — End: 2016-08-11

## 2016-08-04 MED ORDER — PHENAZOPYRIDINE HCL 100 MG PO TABS: 100.0000 mg | ORAL_TABLET | Freq: Three times a day (TID) | ORAL | 0 refills | Status: AC | PRN

## 2016-08-04 MED ORDER — FLUCONAZOLE 150 MG PO TABS: 150.0000 mg | ORAL_TABLET | Freq: Once | ORAL | 0 refills | Status: AC

## 2016-08-04 NOTE — Telephone Encounter (Signed)
Pt said that she is still hurting from her bladder infection and the dr had said call back if she was still hurting. She has now finished her meds yesterday morning and she is still in pain. It still feels like she has to go all the time and it is just a dripple. and is burning. Please advise.

## 2016-08-04 NOTE — Telephone Encounter (Signed)
Patient coming in today  

## 2016-08-04 NOTE — Patient Instructions (Signed)
Start the antibiotics Please do eat yogurt daily or take a probiotic daily for the next month or two We want to replace the healthy germs in the gut If you notice foul, watery diarrhea in the next two months, schedule an appointment RIGHT AWAY Use the diflucan only if needed Use the pyridium to help with symptoms  Let me know if not getting better

## 2016-08-04 NOTE — Progress Notes (Signed)
BP 122/78 (BP Location: Left Arm, Patient Position: Sitting, Cuff Size: Normal)   Pulse 77   Temp 98.7 F (37.1 C) (Oral)   Resp 14   Wt 231 lb 1 oz (104.8 kg)   SpO2 96%   BMI 38.45 kg/m    Subjective:    Patient ID: Patricia Fischer, female    DOB: 03/09/1956, 60 y.o.   MRN: [id number]  HPI: Patricia Fischer is a 60 y.o. female  Chief Complaint  Patient presents with  . Cystitis    Burning and excessive using the bathroom. Pain in her back and adbomen    Patient is here to f/u since her symptoms were not completely gone, mostly better, but she is still having urinary urgency and frequency; urine sometimes smells strong; she holds it longer and has a bad odor then; better, maybe 80% better Still hurting in the back, left or right, then down in the pelvic area; like a pressure; did not have children naturally, but feels like something is sitting on her bladder, like a weight  She underwent an abd/pelv CT scan on Nov 22nd: -------------------------------------------------- IMPRESSION: No acute abnormality or finding to explain the patient's symptoms.  Mild atherosclerosis.  Status post cholecystectomy, appendectomy and hysterectomy.  Sigmoid diverticulosis without diverticulitis.   Electronically Signed   By: Inge Rise M.D.   On: 07/27/2016 15:46 -----------------------------------------------------  Depression screen [facility] 2/9 08/04/2016 07/27/2016 03/22/2016 11/10/2015 08/04/2015  Decreased Interest 0 0 0 0 2  Down, Depressed, Hopeless 0 0 0 1 1  PHQ - 2 Score 0 0 0 1 3  Altered sleeping - - - - 2  Tired, decreased energy - - - - 3  Change in appetite - - - - 2  Feeling bad or failure about yourself  - - - - 2  Trouble concentrating - - - - 1  Moving slowly or fidgety/restless - - - - 0  Suicidal thoughts - - - - 0  PHQ-9 Score - - - - 13   Relevant past medical, surgical, family and social history reviewed Past Medical History:  Diagnosis Date    . Anxiety   . Asthma   . Blood transfusion [place]  . Breast cancer ([place]) 04/26/2008   left breast  . Breast cancer, left breast ([place]) 2011   DCIS  . Chronic migraine without aura, with intractable migraine, so stated, with status migrainosus March 2016  . Chronic systolic CHF (congestive heart failure) ([place]) 05/05/2015   a. Dx 04/2015 - EF initially 25-30% with RV dysfunction, f/u echo 08/2015 technically difficult, EF 35-40%, anterior, anteroseptal, apical and inferoapical severe hypokinesis suggestive of LAD territory ischemia/infarct, grade 1 DD, mild MR, RV normal.  . Depression   . DVT (deep venous thrombosis) ([place]) Aug. 2016   a. Dx 04/2015 - patient returned 05/2015 after noncompliance with Xarelto.  . Environmental allergies   . Essential hypertension   . Family history of adverse reaction to anesthesia    " My Mother would get deathly sick "  . GERD (gastroesophageal reflux disease)   . Head injury 2011  . Hemorrhoids   . LBBB (left bundle branch block)   . Obesity   . Orthostatic hypotension   . Pneumonia 2012  . 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 2016   a. Dx 04/2015 - patient returned 05/2015 after noncompliance with Xarelto.  . Shortness of breath dyspnea   . Syncope    a. Recurrent syncope in 2016  felt 2/2 orthostasis in setting of PE.   Past Surgical History:  Procedure Laterality Date  . ABDOMINAL HYSTERECTOMY  1986  . ANAL FISSURE REPAIR    . ANTERIOR CERVICAL DECOMP/DISCECTOMY FUSION  01/23/2012   Procedure: ANTERIOR CERVICAL DECOMPRESSION/DISCECTOMY FUSION 1 LEVEL;  Surgeon: Elaina Hoops, MD;  Location: [place];  Service: Neurosurgery;  Laterality: N/A;  Cervical five-six anterior cervical discectomy fusion with plating  . APPENDECTOMY    . BREAST SURGERY Left 2011   Breast lumpectomy  . CARDIAC CATHETERIZATION  2010   [facility]; pt states it was "clear"  . CARDIAC CATHETERIZATION N/A 10/28/2015   Procedure: Right/Left Heart Cath and  Coronary Angiography;  Surgeon: Belva Crome, MD;  Location: [place] [facility];  Service: Cardiovascular;  Laterality: N/A;  . CARPAL TUNNEL RELEASE Bilateral   . CESAREAN SECTION  1982, 1979  . CHOLECYSTECTOMY    . COLONOSCOPY  01/16/2009   Dr Bary Castilla  . KNEE CARTILAGE SURGERY Left   . SHOULDER ARTHROSCOPY WITH SUBACROMIAL DECOMPRESSION, ROTATOR CUFF REPAIR AND BICEP TENDON REPAIR Right 11/22/2013   Procedure: RIGHT SHOULDER ATHROSCOPY OPEN SUBSCAP REPAIR DELTA-PECTORAL ;  Surgeon: Augustin Schooling, MD;  Location: [facility];  Service: Orthopedics;  Laterality: Right;  . TARSAL TUNNEL RELEASE Left 1990  . TONSILLECTOMY    . TUBAL LIGATION     Social History  Substance Use Topics  . Smoking status: Never Smoker  . Smokeless tobacco: Never Used  . Alcohol use No     Comment: occasional wine   Interim medical history since last visit reviewed. Allergies and medications reviewed  Review of Systems Per HPI unless specifically indicated above     Objective:    BP 122/78 (BP Location: Left Arm, Patient Position: Sitting, Cuff Size: Normal)   Pulse 77   Temp 98.7 F (37.1 C) (Oral)   Resp 14   Wt 231 lb 1 oz (104.8 kg)   SpO2 96%   BMI 38.45 kg/m   Wt Readings from Last 3 Encounters:  08/04/16 231 lb 1 oz (104.8 kg)  07/27/16 232 lb (105.2 kg)  05/17/16 230 lb 11.2 oz (104.6 kg)    Physical Exam  Constitutional: She appears well-developed and well-nourished. No distress.  Cardiovascular: Normal rate and regular rhythm.   Pulmonary/Chest: Effort normal and breath sounds normal.  Abdominal: She exhibits no distension. There is tenderness (lower abdomen). There is no guarding and no CVA tenderness.  Neurological: She is alert.  Skin: She is not diaphoretic.  Psychiatric: She has a normal mood and affect.      Assessment & Plan:   Problem List Items Addressed This Visit      Other   Hematuria   Acute left flank pain    Other Visit Diagnoses    Bladder infection    -   Primary   suspect infection, with residual infection vs inflammation causes symptoms; will use macrobid; see AVS; reasons to go to ER reviewed   Relevant Medications   nitrofurantoin, macrocrystal-monohydrate, (MACROBID) 100 MG capsule   Other Relevant Orders   POCT urinalysis dipstick (Completed)     see comments for hematuria and acute left flank pain under those topics; did not transfer over to note (?) Follow up plan: No Follow-up on file.  An after-visit summary was printed and given to the patient at [place].  Please see the patient instructions which may contain other information and recommendations beyond what is mentioned above in the assessment and plan.  Meds ordered this encounter  Medications  . phenazopyridine (PYRIDIUM) 100 MG tablet    Sig: Take 1 tablet (100 mg total) by mouth 3 (three) times daily as needed for pain.    Dispense:  6 tablet    Refill:  0  . nitrofurantoin, macrocrystal-monohydrate, (MACROBID) 100 MG capsule    Sig: Take 1 capsule (100 mg total) by mouth 2 (two) times daily.    Dispense:  14 capsule    Refill:  0  . fluconazole (DIFLUCAN) 150 MG tablet    Sig: Take 1 tablet (150 mg total) by mouth once.    Dispense:  1 tablet    Refill:  0    Orders Placed This Encounter  Procedures  . POCT urinalysis dipstick

## 2016-08-05 LAB — URINALYSIS W MICROSCOPIC + REFLEX CULTURE
Bacteria, UA: NONE SEEN [HPF]
Bilirubin Urine: NEGATIVE
Casts: NONE SEEN [LPF]
Crystals: NONE SEEN [HPF]
GLUCOSE, UA: NEGATIVE
HGB URINE DIPSTICK: NEGATIVE
KETONES UR: NEGATIVE
LEUKOCYTES UA: NEGATIVE
NITRITE: NEGATIVE
PH: 6 (ref 5.0–8.0)
Protein, ur: NEGATIVE
RBC / HPF: NONE SEEN RBC/HPF (ref <=2)
Specific Gravity, Urine: 1.016 (ref 1.001–1.035)
WBC, UA: NONE SEEN WBC/HPF (ref <=5)
YEAST: NONE SEEN [HPF]

## 2016-09-01 ENCOUNTER — Other Ambulatory Visit: Payer: Self-pay | Admitting: Interventional Cardiology

## 2016-09-01 NOTE — Telephone Encounter (Signed)
spironolactone (ALDACTONE) 25 MG tablet  Medication  Date: 05/12/2016 Department: [facility] Ordering/Authorizing: Belva Crome, MD  Order Providers   Prescribing Provider Encounter Provider  Belva Crome, MD Belva Crome, MD  Medication Detail    Disp Refills Start End   spironolactone (ALDACTONE) 25 MG tablet 42 tablet 3 05/13/2016    Sig - Route: Take 1 tablet (25 mg total) by mouth every Monday, Wednesday, and Friday. - Oral   E-Prescribing Status: Receipt confirmed by pharmacy (05/12/2016 12:14 PM EDT)   Pharmacy   [id number] - [place], [place] - [address]

## 2016-09-08 ENCOUNTER — Other Ambulatory Visit: Payer: Self-pay | Admitting: *Deleted

## 2016-09-08 MED ORDER — SPIRONOLACTONE 25 MG PO TABS: 25.0000 mg | ORAL_TABLET | ORAL | 1 refills | Status: DC

## 2016-10-27 ENCOUNTER — Encounter: Payer: Self-pay | Admitting: Family Medicine

## 2016-10-30 ENCOUNTER — Encounter: Payer: Self-pay | Admitting: Family Medicine

## 2016-11-25 ENCOUNTER — Emergency Department
Admission: EM | Admit: 2016-11-25 | Discharge: 2016-11-26 | Disposition: A | Discharge status: Home / Self Care | Payer: Medicare Other | Attending: Emergency Medicine | Admitting: Emergency Medicine

## 2016-11-25 ENCOUNTER — Emergency Department: Payer: Medicare Other

## 2016-11-25 DIAGNOSIS — Y998 Other external cause status: Secondary | ICD-10-CM | POA: Insufficient documentation

## 2016-11-25 DIAGNOSIS — J45909 Unspecified asthma, uncomplicated: Secondary | ICD-10-CM | POA: Diagnosis not present

## 2016-11-25 DIAGNOSIS — M7061 Trochanteric bursitis, right hip: Secondary | ICD-10-CM | POA: Diagnosis not present

## 2016-11-25 DIAGNOSIS — M25551 Pain in right hip: Secondary | ICD-10-CM

## 2016-11-25 DIAGNOSIS — X501XXA Overexertion from prolonged static or awkward postures, initial encounter: Secondary | ICD-10-CM | POA: Insufficient documentation

## 2016-11-25 DIAGNOSIS — I5022 Chronic systolic (congestive) heart failure: Secondary | ICD-10-CM | POA: Diagnosis not present

## 2016-11-25 DIAGNOSIS — Y9341 Activity, dancing: Secondary | ICD-10-CM | POA: Insufficient documentation

## 2016-11-25 DIAGNOSIS — Y929 Unspecified place or not applicable: Secondary | ICD-10-CM | POA: Insufficient documentation

## 2016-11-25 DIAGNOSIS — I110 Hypertensive heart disease with heart failure: Secondary | ICD-10-CM | POA: Diagnosis not present

## 2016-11-25 DIAGNOSIS — S79911A Unspecified injury of right hip, initial encounter: Secondary | ICD-10-CM | POA: Diagnosis present

## 2016-11-25 DIAGNOSIS — M76891 Other specified enthesopathies of right lower limb, excluding foot: Secondary | ICD-10-CM | POA: Diagnosis not present

## 2016-11-25 DIAGNOSIS — Z79899 Other long term (current) drug therapy: Secondary | ICD-10-CM | POA: Diagnosis not present

## 2016-11-25 DIAGNOSIS — M779 Enthesopathy, unspecified: Secondary | ICD-10-CM

## 2016-11-25 DIAGNOSIS — Z853 Personal history of malignant neoplasm of breast: Secondary | ICD-10-CM | POA: Insufficient documentation

## 2016-11-25 NOTE — ED Triage Notes (Signed)
Pt bought to triage via wheelchair. Pt reports about 30 mins ago she was azt a club dancing when she heard a pop in her right hip. Painful and hard to bear weight on it at this time.

## 2016-11-26 ENCOUNTER — Emergency Department: Payer: Medicare Other

## 2016-11-26 DIAGNOSIS — M25551 Pain in right hip: Secondary | ICD-10-CM | POA: Diagnosis not present

## 2016-11-26 MED ORDER — PREDNISONE 20 MG PO TABS: 60.0000 mg | ORAL_TABLET | Freq: Every day | ORAL | 0 refills | Status: DC

## 2016-11-26 MED ORDER — TRAMADOL HCL 50 MG PO TABS: 50.0000 mg | ORAL_TABLET | Freq: Four times a day (QID) | ORAL | 0 refills | Status: DC | PRN

## 2016-11-26 MED ORDER — PREDNISONE 20 MG PO TABS
60.0000 mg | ORAL_TABLET | Freq: Once | ORAL | Status: AC
  Administered 2016-11-26: 60 mg via ORAL
  Filled 2016-11-26: qty 3

## 2016-11-26 MED ORDER — MORPHINE SULFATE (PF) 4 MG/ML IV SOLN
4.0000 mg | Freq: Once | INTRAVENOUS | Status: AC
  Administered 2016-11-26: 4 mg via INTRAMUSCULAR
  Filled 2016-11-26: qty 1

## 2016-11-26 MED ORDER — HYDROCODONE-ACETAMINOPHEN 5-325 MG PO TABS
1.0000 | ORAL_TABLET | Freq: Once | ORAL | Status: AC
  Administered 2016-11-26: 1 via ORAL
  Filled 2016-11-26: qty 1

## 2016-11-26 MED ORDER — LIDOCAINE 5 % EX PTCH: 1.0000 | MEDICATED_PATCH | Freq: Two times a day (BID) | CUTANEOUS | 0 refills | Status: DC

## 2016-11-26 MED ORDER — DIAZEPAM 2 MG PO TABS
2.0000 mg | ORAL_TABLET | Freq: Once | ORAL | Status: AC
  Administered 2016-11-26: 2 mg via ORAL
  Filled 2016-11-26: qty 1

## 2016-11-26 MED ORDER — LIDOCAINE 5 % EX PTCH
1.0000 | MEDICATED_PATCH | CUTANEOUS | Status: DC
  Administered 2016-11-26: 1 via TRANSDERMAL
  Filled 2016-11-26: qty 1

## 2016-11-26 NOTE — Discharge Instructions (Signed)
Please follow up with your primary care physician for further evaluation °

## 2016-11-26 NOTE — ED Provider Notes (Signed)
[facility] Emergency Department Provider Note   ____________________________________________   First MD Initiated Contact with Patient 11/26/16 0009     (approximate)  I have reviewed the triage vital signs and the nursing notes.   HISTORY  Chief Complaint Hip Pain    HPI Patricia Fischer is a 61 y.o. female who comes into the hospital today with some right hip pain. She reports that she was dancing and doing a certain type of dance. She was kicking her leg and felt something pop. The patient reports that she has pain in right hip up into her back and down into her leg. She reports that the pain is so bad that her teeth hurt. She thought it was a muscle spasm initially but the pain was so bad that she decided to come into the hospital. She reports that she's never had trouble like this in the past. She thought maybe she broke her hip. She states that the pain that the top of her pelvis all the way down into her bottom. The patient rates her pain a 10 out of 10 in intensity. She is here today for evaluation.   Past Medical History:  Diagnosis Date  . Anxiety   . Asthma   . Blood transfusion [place]  . Breast cancer ([place]) 04/26/2008   left breast  . Breast cancer, left breast ([place]) 2011   DCIS  . Chronic migraine without aura, with intractable migraine, so stated, with status migrainosus March 2016  . Chronic systolic CHF (congestive heart failure) ([place]) 05/05/2015   a. Dx 04/2015 - EF initially 25-30% with RV dysfunction, f/u echo 08/2015 technically difficult, EF 35-40%, anterior, anteroseptal, apical and inferoapical severe hypokinesis suggestive of LAD territory ischemia/infarct, grade 1 DD, mild MR, RV normal.  . Depression   . DVT (deep venous thrombosis) ([place]) Aug. 2016   a. Dx 04/2015 - patient returned 05/2015 after noncompliance with Xarelto.  . Environmental allergies   . Essential hypertension   . Family history of adverse reaction  to anesthesia    " My Mother would get deathly sick "  . GERD (gastroesophageal reflux disease)   . Head injury 2011  . Hemorrhoids   . LBBB (left bundle branch block)   . Obesity   . Orthostatic hypotension   . Pneumonia 2012  . Saddle pulmonary embolus ([place]) Aug. 2016   a. Dx 04/2015 - patient returned 05/2015 after noncompliance with Xarelto.  . Shortness of breath dyspnea   . Syncope    a. Recurrent syncope in 2016 felt 2/2 orthostasis in setting of PE.    Patient Active Problem List   Diagnosis Date Noted  . Acute left flank pain 07/27/2016  . Hematuria 07/27/2016  . Acute cystitis 07/27/2016  . Medication monitoring encounter 06/08/2016  . Preventative health care 05/17/2016  . Cervical cancer screening 05/17/2016  . Degenerative joint disease (DJD) of hip 03/31/2016  . Hx of breast cancer 03/22/2016  . GERD (gastroesophageal reflux disease) 10/07/2015  . HTN (hypertension) 10/07/2015  . Orthostatic hypotension 10/07/2015  . Abnormal weight gain 08/08/2015  . Controlled substance agreement signed 08/08/2015  . Depression, major, recurrent, moderate ([place]) 06/11/2015  . Anemia 05/15/2015  . Do not resuscitate status 05/15/2015  . Chronic systolic CHF (congestive heart failure) ([place]) 05/05/2015  . Left leg DVT ([place]) 05/05/2015  . Pulmonary hypertension   . Saddle pulmonary embolus ([place])   . LBBB (left bundle branch block)-rate related  04/19/2015  .  Obesity, Class II, BMI 35-39.9, with comorbidity ([place]) 04/19/2015  . Chronic migraine without aura, with intractable migraine, so stated, with status migrainosus 11/04/2014  . Headache 04/29/2014  . Subscapularis (muscle) sprain 11/22/2013    Past Surgical History:  Procedure Laterality Date  . ABDOMINAL HYSTERECTOMY  1986  . ANAL FISSURE REPAIR    . ANTERIOR CERVICAL DECOMP/DISCECTOMY FUSION  01/23/2012   Procedure: ANTERIOR CERVICAL DECOMPRESSION/DISCECTOMY FUSION 1 LEVEL;  Surgeon: Elaina Hoops, MD;  Location: [facility];  Service: Neurosurgery;  Laterality: N/A;  Cervical five-six anterior cervical discectomy fusion with plating  . APPENDECTOMY    . BREAST SURGERY Left 2011   Breast lumpectomy  . CARDIAC CATHETERIZATION  2010   [facility]; pt states it was "clear"  . CARDIAC CATHETERIZATION N/A 10/28/2015   Procedure: Right/Left Heart Cath and Coronary Angiography;  Surgeon: Belva Crome, MD;  Location: [place] [facility];  Service: Cardiovascular;  Laterality: N/A;  . CARPAL TUNNEL RELEASE Bilateral   . CESAREAN SECTION  1982, 1979  . CHOLECYSTECTOMY    . COLONOSCOPY  01/16/2009   Dr Bary Castilla  . KNEE CARTILAGE SURGERY Left   . SHOULDER ARTHROSCOPY WITH SUBACROMIAL DECOMPRESSION, ROTATOR CUFF REPAIR AND BICEP TENDON REPAIR Right 11/22/2013   Procedure: RIGHT SHOULDER ATHROSCOPY OPEN SUBSCAP REPAIR DELTA-PECTORAL ;  Surgeon: Augustin Schooling, MD;  Location: [facility];  Service: Orthopedics;  Laterality: Right;  . TARSAL TUNNEL RELEASE Left 1990  . TONSILLECTOMY    . TUBAL LIGATION      Prior to Admission medications   Medication Sig Start Date End Date Taking? Authorizing Provider  albuterol (PROVENTIL HFA;VENTOLIN HFA) 108 (90 BASE) MCG/ACT inhaler Inhale 2 puffs into the lungs every 6 (six) hours as needed for shortness of breath. Reported on 11/10/2015    Historical Provider, MD  amitriptyline (ELAVIL) 25 MG tablet Take 3 tablets (75 mg total) by mouth at bedtime. 12/10/15   Kathrynn Ducking, MD  carvedilol (COREG) 6.25 MG tablet Take 1 tablet (6.25 mg total) by mouth 2 (two) times daily. Patient taking differently: Take 6.25 mg by mouth 2 (two) times daily.  05/12/16   Belva Crome, MD  Cyanocobalamin (VITAMIN B-12) 5000 MCG SUBL Place 5,000 mcg under the tongue daily.    Historical Provider, MD  fluticasone (FLONASE) 50 MCG/ACT nasal spray Place 1 spray into both nostrils daily as needed for allergies or rhinitis.  07/04/14   Historical Provider, MD  levalbuterol Penne Lash HFA) 45 MCG/ACT inhaler  Inhale 2 puffs into the lungs every 4 (four) hours as needed for wheezing.    Historical Provider, MD  rivaroxaban (XARELTO) 20 MG TABS tablet Take 1 tablet (20 mg total) by mouth daily with supper. 05/12/16   Belva Crome, MD  spironolactone (ALDACTONE) 25 MG tablet Take 1 tablet (25 mg total) by mouth every Monday, Wednesday, and Friday. 09/09/16   Belva Crome, MD  tiZANidine (ZANAFLEX) 2 MG tablet Take 1-2 tablets (2-4 mg total) by mouth at bedtime as needed for muscle spasms. 03/02/16   Arnetha Courser, MD    Allergies Gadolinium derivatives; Sulfa antibiotics; Aripiprazole; Codeine; Oxycodone; and Penicillins  Family History  Problem Relation Age of Onset  . Breast cancer Paternal Aunt   . Breast cancer Mother 81  . Migraines Mother   . Cancer Mother   . Hypertension Mother   . Rectal cancer Father   . Cancer Father   . COPD Father   . Heart disease Paternal Grandmother   .  Stroke Paternal Grandmother   . Heart attack Paternal Grandmother   . Aneurysm Paternal Grandfather   . COPD Paternal Grandfather   . Breast cancer Cousin   . Breast cancer Cousin   . Breast cancer Cousin   . Anesthesia problems Neg Hx   . Diabetes Neg Hx     Social History Social History  Substance Use Topics  . Smoking status: Never Smoker  . Smokeless tobacco: Never Used  . Alcohol use No     Comment: occasional wine    Review of Systems Constitutional: No fever/chills Eyes: No visual changes. ENT: No sore throat. Cardiovascular: Denies chest pain. Respiratory: Denies shortness of breath. Gastrointestinal: No abdominal pain.  No nausea, no vomiting.  No diarrhea.  No constipation. Genitourinary: Negative for dysuria. Musculoskeletal: right hip pain and low back pain Skin: Negative for rash. Neurological: Negative for headaches, focal weakness or numbness.  10-point ROS otherwise negative.  ____________________________________________   PHYSICAL EXAM:  VITAL SIGNS: ED Triage Vitals    Enc Vitals Group     BP 11/25/16 2327 (!) 141/83     Pulse Rate 11/25/16 2327 (!) 14     Resp 11/25/16 2327 20     Temp 11/25/16 2327 98.5 F (36.9 C)     Temp Source 11/25/16 2327 Oral     SpO2 11/25/16 2327 97 %     Weight 11/25/16 2328 230 lb (104.3 kg)     Height 11/25/16 2328 5\' 5"  (1.651 m)     Head Circumference --      Peak Flow --      Pain Score 11/25/16 2330 10     Pain Loc --      Pain Edu? --      Excl. in [place]? --     Constitutional: Alert and oriented. Well appearing and in moderate distress. Eyes: Conjunctivae are normal. PERRL. EOMI. Head: Atraumatic. Nose: No congestion/rhinnorhea. Mouth/Throat: Mucous membranes are moist.  Oropharynx non-erythematous. Cardiovascular: Normal rate, regular rhythm. Grossly normal heart sounds.  Good peripheral circulation. Respiratory: Normal respiratory effort.  No retractions. Lungs CTAB. Gastrointestinal: Soft and nontender. No distention. Positive bowel sounds Musculoskeletal: right hip and gluteus tenderness to palpation Neurologic:  Normal speech and language. Positive bowel sounds Skin:  Skin is warm, dry and intact. Psychiatric: Mood and affect are normal.   ____________________________________________   LABS (all labs ordered are listed, but only abnormal results are displayed)  Labs Reviewed - No data to display ____________________________________________  EKG  none ____________________________________________  RADIOLOGY  Right hip xray Right hip CT scan ____________________________________________   PROCEDURES  Procedure(s) performed: None  Procedures  Critical Care performed: No  ____________________________________________   INITIAL IMPRESSION / ASSESSMENT AND PLAN / ED COURSE  Pertinent labs & imaging results that were available during my care of the patient were reviewed by me and considered in my medical decision making (see chart for details).  This is a 61 year old female who comes  into the hospital today with some pain in her right hip after doing some kicking while dancing. The patient's x-ray was negative so I did perform a CT scan given her pain and that shows some inflammatory changes of the attachment of the gluteus muscle to the greater trochanter of the right femur. The patient may have bursitis or tendinitis. I did give the patient a shot of morphine, a dose of Valium and a Lidoderm patch to her hip. I will also give the patient crutches to help with her ambulation. I  will discharge her home with some Lidoderm patches, some steroids and some pain medicine. The patient will be discharged to home.  Clinical Course as of Nov 27 138  Sat Nov 26, 2016  0016 No evidence of fracture or dislocation. DG Hip Unilat  With Pelvis 2-3 Views Right [AW]  0128 1. No acute fracture or dislocation. 2. Inflammatory changes of the attachment of the gluteus medius to the greater trochanter of the right femur. Findings may be related to tendinitis or bursitis. Correlation with clinical exam recommended. Ultrasound may provide better evaluation if clinically indicated.   CT Hip Right Wo Contrast [AW]    Clinical Course User Index [AW] Loney Hering, MD     ____________________________________________   FINAL CLINICAL IMPRESSION(S) / ED DIAGNOSES  Final diagnoses:  Right hip pain  Tendonitis  Trochanteric bursitis of right hip      NEW MEDICATIONS STARTED DURING THIS VISIT:  New Prescriptions   No medications on file     Note:  This document was prepared using Dragon voice recognition software and may include unintentional dictation errors.    Loney Hering, MD 11/26/16 [phone]

## 2016-11-26 NOTE — ED Notes (Signed)
Pt taken to CT.

## 2016-11-26 NOTE — ED Notes (Signed)
Pt refusing crutches, states she can't use them due to bad shoulder, pt request to speak with Dr. Dahlia Client, Dr. Dahlia Client informed

## 2016-11-29 ENCOUNTER — Other Ambulatory Visit: Payer: Self-pay

## 2016-11-29 MED ORDER — TIZANIDINE HCL 2 MG PO TABS: 2.0000 mg | ORAL_TABLET | Freq: Every evening | ORAL | 0 refills | Status: DC | PRN

## 2017-01-09 ENCOUNTER — Ambulatory Visit (HOSPITAL_COMMUNITY): Payer: Medicare Other | Attending: Cardiology

## 2017-01-09 ENCOUNTER — Other Ambulatory Visit: Payer: Self-pay

## 2017-01-09 DIAGNOSIS — I5022 Chronic systolic (congestive) heart failure: Secondary | ICD-10-CM | POA: Insufficient documentation

## 2017-02-01 ENCOUNTER — Other Ambulatory Visit: Payer: Self-pay | Admitting: Family Medicine

## 2017-02-06 NOTE — Telephone Encounter (Signed)
error 

## 2017-02-15 ENCOUNTER — Ambulatory Visit: Payer: Medicare Other | Admitting: Interventional Cardiology

## 2017-03-05 NOTE — Progress Notes (Signed)
Cardiology Office Note    Date:  03/06/2017   ID:  SHARIN ALTIDOR, DOB 1956/09/03, MRN [id number]  PCP:  Arnetha Courser, MD  Cardiologist: Sinclair Grooms, MD   Chief Complaint  Patient presents with  . Coronary Artery Disease  . Congestive Heart Failure    History of Present Illness:  Patricia Fischer is a 61 y.o. female  history of old pulmonary embolism, chronic systolic heart failure, essential hypertension, and chronic anticoagulation therapy.  Increased cramping in her calves. Has no orthopnea or PND. Decreased energy. Concerned about her LV function. EF on the last echo was 30-35%. Prior echo was 35-40%. She denies orthopnea and PND. No episodes of syncope or dizziness. Previously unable to tolerate ARB therapy. Blood pressures were too low on the heart failure therapy.  Past Medical History:  Diagnosis Date  . Anxiety   . Asthma   . Blood transfusion [place]  . Breast cancer ([place]) 04/26/2008   left breast  . Breast cancer, left breast ([place]) 2011   DCIS  . Chronic migraine without aura, with intractable migraine, so stated, with status migrainosus March 2016  . Chronic systolic CHF (congestive heart failure) ([place]) 05/05/2015   a. Dx 04/2015 - EF initially 25-30% with RV dysfunction, f/u echo 08/2015 technically difficult, EF 35-40%, anterior, anteroseptal, apical and inferoapical severe hypokinesis suggestive of LAD territory ischemia/infarct, grade 1 DD, mild MR, RV normal.  . Depression   . DVT (deep venous thrombosis) ([place]) Aug. 2016   a. Dx 04/2015 - patient returned 05/2015 after noncompliance with Xarelto.  . Environmental allergies   . Essential hypertension   . Family history of adverse reaction to anesthesia    " My Mother would get deathly sick "  . GERD (gastroesophageal reflux disease)   . Head injury 2011  . Hemorrhoids   . LBBB (left bundle branch block)   . Obesity   . Orthostatic hypotension   . Pneumonia 2012  . Saddle pulmonary  embolus ([place]) Aug. 2016   a. Dx 04/2015 - patient returned 05/2015 after noncompliance with Xarelto.  . Shortness of breath dyspnea   . Syncope    a. Recurrent syncope in 2016 felt 2/2 orthostasis in setting of PE.    Past Surgical History:  Procedure Laterality Date  . ABDOMINAL HYSTERECTOMY  1986  . ANAL FISSURE REPAIR    . ANTERIOR CERVICAL DECOMP/DISCECTOMY FUSION  01/23/2012   Procedure: ANTERIOR CERVICAL DECOMPRESSION/DISCECTOMY FUSION 1 LEVEL;  Surgeon: Elaina Hoops, MD;  Location: [facility];  Service: Neurosurgery;  Laterality: N/A;  Cervical five-six anterior cervical discectomy fusion with plating  . APPENDECTOMY    . BREAST SURGERY Left 2011   Breast lumpectomy  . CARDIAC CATHETERIZATION  2010   [facility]; pt states it was "clear"  . CARDIAC CATHETERIZATION N/A 10/28/2015   Procedure: Right/Left Heart Cath and Coronary Angiography;  Surgeon: Belva Crome, MD;  Location: [facility];  Service: Cardiovascular;  Laterality: N/A;  . CARPAL TUNNEL RELEASE Bilateral   . CESAREAN SECTION  1982, 1979  . CHOLECYSTECTOMY    . COLONOSCOPY  01/16/2009   Dr Bary Castilla  . KNEE CARTILAGE SURGERY Left   . SHOULDER ARTHROSCOPY WITH SUBACROMIAL DECOMPRESSION, ROTATOR CUFF REPAIR AND BICEP TENDON REPAIR Right 11/22/2013   Procedure: RIGHT SHOULDER ATHROSCOPY OPEN SUBSCAP REPAIR DELTA-PECTORAL ;  Surgeon: Augustin Schooling, MD;  Location: [facility];  Service: Orthopedics;  Laterality: Right;  . TARSAL TUNNEL  RELEASE Left 1990  . TONSILLECTOMY    . TUBAL LIGATION      Current Medications: Outpatient Medications Prior to Visit  Medication Sig Dispense Refill  . amitriptyline (ELAVIL) 25 MG tablet Take 3 tablets (75 mg total) by mouth at bedtime. 90 tablet 2  . fluticasone (FLONASE) 50 MCG/ACT nasal spray Place 1 spray into both nostrils daily as needed for allergies or rhinitis.   11  . levalbuterol (XOPENEX HFA) 45 MCG/ACT inhaler Inhale 2 puffs into the lungs every 4 (four) hours as  needed for wheezing.    . rivaroxaban (XARELTO) 20 MG TABS tablet Take 1 tablet (20 mg total) by mouth daily with supper. 30 tablet 11  . spironolactone (ALDACTONE) 25 MG tablet Take 1 tablet (25 mg total) by mouth every Monday, Wednesday, and Friday. 42 tablet 1  . tiZANidine (ZANAFLEX) 2 MG tablet TAKE 1 TO 2 TABLETS BY MOUTH EVERY DAY AT BEDTIME AS NEEDED FOR MUSCLE SPASMS 30 tablet 0  . albuterol (PROVENTIL HFA;VENTOLIN HFA) 108 (90 BASE) MCG/ACT inhaler Inhale 2 puffs into the lungs every 6 (six) hours as needed for shortness of breath. Reported on 11/10/2015    . carvedilol (COREG) 6.25 MG tablet Take 1 tablet (6.25 mg total) by mouth 2 (two) times daily. (Patient not taking: Reported on 03/06/2017) 60 tablet 11  . Cyanocobalamin (VITAMIN B-12) 5000 MCG SUBL Place 5,000 mcg under the tongue daily.    Marland Kitchen lidocaine (LIDODERM) 5 % Place 1 patch onto the skin every 12 (twelve) hours. Remove & Discard patch within 12 hours or as directed by MD (Patient not taking: Reported on 03/06/2017) 10 patch 0  . predniSONE (DELTASONE) 20 MG tablet Take 3 tablets (60 mg total) by mouth daily. (Patient not taking: Reported on 03/06/2017) 12 tablet 0  . traMADol (ULTRAM) 50 MG tablet Take 1 tablet (50 mg total) by mouth every 6 (six) hours as needed. (Patient not taking: Reported on 03/06/2017) 12 tablet 0   No facility-administered medications prior to visit.      Allergies:   Gadolinium derivatives; Sulfa antibiotics; Aripiprazole; Codeine; Oxycodone; and Penicillins   Social History   Social History  . Marital status: Divorced    Spouse name: N/A  . Number of children: 2  . Years of education: college-1   Occupational History  . Unemployed    Social History Main Topics  . Smoking status: Never Smoker  . Smokeless tobacco: Never Used  . Alcohol use No     Comment: occasional wine  . Drug use: No  . Sexual activity: No   Other Topics Concern  . None   Social History Narrative  . None     Family  History:  The patient's family history includes Aneurysm in her paternal grandfather; Breast cancer in her cousin, cousin, cousin, and paternal aunt; Breast cancer (age of onset: 63) in her mother; COPD in her father and paternal grandfather; Cancer in her father and mother; Heart attack in her paternal grandmother; Heart disease in her paternal grandmother; Hypertension in her mother; Migraines in her mother; Rectal cancer in her father; Stroke in her paternal grandmother.   ROS:   Please see the history of present illness.    Vision disturbance, cough, abdominal pain, back pain, muscle pain, dizziness, easy bruising, headache, bleeding gums, and cough.  All other systems reviewed and are negative.   PHYSICAL EXAM:   VS:  BP (!) 146/92 (BP Location: Right Arm)   Pulse 74   Ht  5' 4.5" (1.638 m)   Wt 238 lb 12.8 oz (108.3 kg)   BMI 40.36 kg/m    GEN: Well nourished, well developed, in no acute distress  HEENT: normal  Neck: no JVD, carotid bruits, or masses Cardiac: RRR; no murmurs, rubs, or gallops,no edema  Respiratory:  clear to auscultation bilaterally, normal work of breathing GI: soft, nontender, nondistended, + BS MS: no deformity or atrophy  Skin: warm and dry, no rash Neuro:  Alert and Oriented x 3, Strength and sensation are intact Psych: euthymic mood, full affect  Wt Readings from Last 3 Encounters:  03/06/17 238 lb 12.8 oz (108.3 kg)  11/25/16 230 lb (104.3 kg)  08/04/16 231 lb 1 oz (104.8 kg)      Studies/Labs Reviewed:   EKG:  EKG  Normal sinus rhythm, left atrial abnormality, nonspecific T wave flattening, poor R-wave progression  Recent Labs: 03/22/2016: TSH 0.944 05/17/2016: ALT 14 07/27/2016: BUN 9; Creatinine, Ser 0.72; Hemoglobin 13.0; Platelets 172; Potassium 4.2; Sodium 138   Lipid Panel    Component Value Date/Time   CHOL 125 05/17/2016 1454   TRIG 92 05/17/2016 1454   HDL 51 05/17/2016 1454   CHOLHDL 2.5 05/17/2016 1454   VLDL 18 05/17/2016 1454     LDLCALC 56 05/17/2016 1454    Additional studies/ records that were reviewed today include:  Echocardiogram May 2018:  ------------------------------------------------------------------- Study Conclusions  - Left ventricle: The cavity size was normal. Wall thickness was   increased in a pattern of mild LVH. Systolic function was   moderately to severely reduced. The estimated ejection fraction   was in the range of 30% to 35%. Diffuse hypokinesis. Doppler   parameters are consistent with abnormal left ventricular   relaxation (grade 1 diastolic dysfunction).  Impressions:  - Moderate to severe global reduction in LV systolic function; mild   diastolic dysfunction; trace MR and TR.   ASSESSMENT:    1. Chronic systolic CHF (congestive heart failure) ([place])   2. Essential hypertension   3. LBBB (left bundle branch block)-rate related    4. Chronic saddle pulmonary embolism without acute cor pulmonale (HCC)      PLAN:  In order of problems listed above:  1. LVEF still hovering in the 30-40% range. Recent echo suggested 30-35%. Prior suggested 35-40%. She is concerned about a minor difference. Some of the LV dysfunction could be related to left bundle branch block. She denies orthopnea, PND, edema. I think her volume status is stable. She had been unable tolerate low-dose ARB therapy because of hypotension. Will be seen again in 2-3 months by a [facility] APP to possibly further titrate heart failure therapy. At that time perhaps a higher dose of ARB can be achieved. 2. Elevated today. Will again try low-dose ARB therapy in the form of losartan 25 mg per day. Comprehensive metabolic panel and CBC will be done in one week. 3. Not addressed. Not a candidate for resynchronization therapy without frank heart failure symptoms. 4. Continue anticoagulation therapy indefinitely.  Overall doing well. Further titrate heart failure therapy. Losartan is started today. Clinical follow-up with  team member in 3 months and with me in 9-12 months.    Medication Adjustments/Labs and Tests Ordered: Current medicines are reviewed at length with the patient today.  Concerns regarding medicines are outlined above.  Medication changes, Labs and Tests ordered today are listed in the Patient Instructions below. There are no Patient Instructions on file for this visit.   Signed, Belva Crome  III, MD  03/06/2017 11:31 AM    [facility] [address], [place], [place]  [address] Phone: [phone]; Fax: [phone]

## 2017-03-06 ENCOUNTER — Encounter: Payer: Self-pay | Admitting: Interventional Cardiology

## 2017-03-06 ENCOUNTER — Ambulatory Visit (INDEPENDENT_AMBULATORY_CARE_PROVIDER_SITE_OTHER): Payer: Medicare Other | Admitting: Interventional Cardiology

## 2017-03-06 ENCOUNTER — Encounter (INDEPENDENT_AMBULATORY_CARE_PROVIDER_SITE_OTHER): Payer: Self-pay

## 2017-03-06 VITALS — BP 146/92 | HR 74 | Ht 64.5 in | Wt 238.8 lb

## 2017-03-06 DIAGNOSIS — I2692 Saddle embolus of pulmonary artery without acute cor pulmonale: Secondary | ICD-10-CM | POA: Diagnosis not present

## 2017-03-06 DIAGNOSIS — I10 Essential (primary) hypertension: Secondary | ICD-10-CM | POA: Diagnosis not present

## 2017-03-06 DIAGNOSIS — I447 Left bundle-branch block, unspecified: Secondary | ICD-10-CM

## 2017-03-06 DIAGNOSIS — I2782 Chronic pulmonary embolism: Secondary | ICD-10-CM

## 2017-03-06 DIAGNOSIS — I5022 Chronic systolic (congestive) heart failure: Secondary | ICD-10-CM

## 2017-03-06 MED ORDER — LOSARTAN POTASSIUM 25 MG PO TABS: 25.0000 mg | ORAL_TABLET | Freq: Every day | ORAL | 3 refills | Status: DC

## 2017-03-06 NOTE — Patient Instructions (Signed)
Medication Instructions:  1) START Losartan 25mg  once daily  Labwork: Your physician recommends that you return for lab work in: 1 week (CMET, CBC)   Testing/Procedures: None  Follow-Up: Your physician recommends that you schedule a follow-up appointment in: 3 months with a PA or NP.    Your physician wants you to follow-up in: 9-12 months with Dr. Tamala Julian.  You will receive a reminder letter in the mail two months in advance. If you don't receive a letter, please call our office to schedule the follow-up appointment.    Any Other Special Instructions Will Be Listed Below (If Applicable).     If you need a refill on your cardiac medications before your next appointment, please call your pharmacy.

## 2017-03-14 ENCOUNTER — Other Ambulatory Visit
Admission: RE | Admit: 2017-03-14 | Discharge: 2017-03-14 | Disposition: A | Discharge status: Home / Self Care | Payer: Medicare Other | Source: Ambulatory Visit | Attending: Interventional Cardiology | Admitting: Interventional Cardiology

## 2017-03-14 DIAGNOSIS — I10 Essential (primary) hypertension: Secondary | ICD-10-CM | POA: Diagnosis not present

## 2017-03-14 DIAGNOSIS — I5022 Chronic systolic (congestive) heart failure: Secondary | ICD-10-CM | POA: Diagnosis not present

## 2017-03-14 LAB — CBC
HCT: 40 % (ref 35.0–47.0)
HEMOGLOBIN: 13.3 g/dL (ref 12.0–16.0)
MCH: 27.3 pg (ref 26.0–34.0)
MCHC: 33.4 g/dL (ref 32.0–36.0)
MCV: 81.7 fL (ref 80.0–100.0)
Platelets: 203 10*3/uL (ref 150–440)
RBC: 4.9 MIL/uL (ref 3.80–5.20)
RDW: 15.3 % — ABNORMAL HIGH (ref 11.5–14.5)
WBC: 8.7 10*3/uL (ref 3.6–11.0)

## 2017-03-14 LAB — COMPREHENSIVE METABOLIC PANEL
ALBUMIN: 4.1 g/dL (ref 3.5–5.0)
ALK PHOS: 88 U/L (ref 38–126)
ALT: 15 U/L (ref 14–54)
ANION GAP: 10 (ref 5–15)
AST: 25 U/L (ref 15–41)
BUN: 20 mg/dL (ref 6–20)
CALCIUM: 9.8 mg/dL (ref 8.9–10.3)
CO2: 26 mmol/L (ref 22–32)
Chloride: 101 mmol/L (ref 101–111)
Creatinine, Ser: 1.06 mg/dL — ABNORMAL HIGH (ref 0.44–1.00)
GFR calc Af Amer: >60 mL/min (ref >60)
GFR calc non Af Amer: 55 mL/min — ABNORMAL LOW (ref >60)
GLUCOSE: 162 mg/dL — AB (ref 65–99)
Potassium: 3.9 mmol/L (ref 3.5–5.1)
SODIUM: 137 mmol/L (ref 135–145)
Total Bilirubin: 0.6 mg/dL (ref 0.3–1.2)
Total Protein: 7.9 g/dL (ref 6.5–8.1)

## 2017-03-27 ENCOUNTER — Other Ambulatory Visit: Payer: Self-pay | Admitting: Interventional Cardiology

## 2017-05-02 ENCOUNTER — Other Ambulatory Visit: Payer: Self-pay | Admitting: Interventional Cardiology

## 2017-06-26 ENCOUNTER — Encounter: Payer: Self-pay | Admitting: Family Medicine

## 2017-06-26 ENCOUNTER — Ambulatory Visit (INDEPENDENT_AMBULATORY_CARE_PROVIDER_SITE_OTHER): Payer: Medicare Other | Admitting: Family Medicine

## 2017-06-26 VITALS — BP 112/72 | HR 97 | Temp 98.5°F | Resp 14 | Wt 238.4 lb

## 2017-06-26 DIAGNOSIS — G441 Vascular headache, not elsewhere classified: Secondary | ICD-10-CM

## 2017-06-26 DIAGNOSIS — J302 Other seasonal allergic rhinitis: Secondary | ICD-10-CM

## 2017-06-26 DIAGNOSIS — Z23 Encounter for immunization: Secondary | ICD-10-CM | POA: Diagnosis not present

## 2017-06-26 DIAGNOSIS — S39012A Strain of muscle, fascia and tendon of lower back, initial encounter: Secondary | ICD-10-CM | POA: Diagnosis not present

## 2017-06-26 DIAGNOSIS — G4700 Insomnia, unspecified: Secondary | ICD-10-CM | POA: Insufficient documentation

## 2017-06-26 DIAGNOSIS — M25561 Pain in right knee: Secondary | ICD-10-CM | POA: Diagnosis not present

## 2017-06-26 DIAGNOSIS — D485 Neoplasm of uncertain behavior of skin: Secondary | ICD-10-CM

## 2017-06-26 DIAGNOSIS — J309 Allergic rhinitis, unspecified: Secondary | ICD-10-CM | POA: Insufficient documentation

## 2017-06-26 MED ORDER — FLUTICASONE PROPIONATE 50 MCG/ACT NA SUSP: 1.0000 | Freq: Every day | NASAL | 11 refills | Status: DC | PRN

## 2017-06-26 MED ORDER — TIZANIDINE HCL 2 MG PO TABS: ORAL_TABLET | ORAL | 0 refills | Status: DC

## 2017-06-26 MED ORDER — LEVALBUTEROL TARTRATE 45 MCG/ACT IN AERO: 2.0000 | INHALATION_SPRAY | Freq: Four times a day (QID) | RESPIRATORY_TRACT | 1 refills | Status: DC | PRN

## 2017-06-26 NOTE — Progress Notes (Signed)
BP 112/72   Pulse 97   Temp 98.5 F (36.9 C) (Oral)   Resp 14   Wt 238 lb 6.4 oz (108.1 kg)   SpO2 95%   BMI 40.29 kg/m    Subjective:    Patient ID: Patricia Fischer, female    DOB: 20-Nov-1955, 61 y.o.   MRN: [id number]  HPI: Patricia Fischer is a 61 y.o. female  Chief Complaint  Patient presents with  . Annual Exam  . Medication Refill  . Back Pain  . skin check    right lower back leg    HPI She was at the beach and had to go up three flights of stairs; on the last flight; she fell and popped her right nee against the stair; fell forward; completely exhausted; did not hear anything pop or crunch; heart failure played a role in that Was able to bear weight; able to finish going up the stairs; no swelling or redness or heat; did not put ice Two weeks ago Thursday Her church did the prolife walk and stood for 3.5 hours Now her back hurts; not a certain spot; from shoulders down the right leg Put ice on that No loss of B/B dysfunction No old injuries to the knee or the back No problems with the knee going up or down stairs Feel crunching in the right knee and right hip No locking of the knee Cannot take NSAIDs, takes blood thinners  She also has a few places on the back of her legs; they hurt, not sure if they need to be removed She has spent a lot of time in the sun over the years  Needs several medicines refilled She is not seeing Dr. Jannifer Franklin any more; she takes 75 mg TCA PRN sleep The beta-blocker and ARB and diuretic are per cardiologist Nasal spray; she has allergies in the spring and fall Using Xopenex; just PRN; just wants on file; she has used this inhaler and flovent, proair, ventolin; just keeps them in case; worse with bronchitis, URI She was on topirmate and that was PRN Headaches may have been due to PE; still has maybe 5-6 per month; not as bad as a migraine, but it is a headache  Depression screen [facility] 2/9 06/26/2017 08/04/2016 07/27/2016 03/22/2016  11/10/2015  Decreased Interest 0 0 0 0 0  Down, Depressed, Hopeless 0 0 0 0 1  PHQ - 2 Score 0 0 0 0 1  Altered sleeping - - - - -  Tired, decreased energy - - - - -  Change in appetite - - - - -  Feeling bad or failure about yourself  - - - - -  Trouble concentrating - - - - -  Moving slowly or fidgety/restless - - - - -  Suicidal thoughts - - - - -  PHQ-9 Score - - - - -    Relevant past medical, surgical, family and social history reviewed Past Medical History:  Diagnosis Date  . Anxiety   . Asthma   . Blood transfusion [place]  . Breast cancer ([place]) 04/26/2008   left breast  . Breast cancer, left breast ([place]) 2011   DCIS  . Chronic migraine without aura, with intractable migraine, so stated, with status migrainosus March 2016  . Chronic systolic CHF (congestive heart failure) ([place]) 05/05/2015   a. Dx 04/2015 - EF initially 25-30% with RV dysfunction, f/u echo 08/2015 technically difficult, EF 35-40%, anterior, anteroseptal, apical and inferoapical severe  hypokinesis suggestive of LAD territory ischemia/infarct, grade 1 DD, mild MR, RV normal.  . Depression   . DVT (deep venous thrombosis) ([place]) Aug. 2016   a. Dx 04/2015 - patient returned 05/2015 after noncompliance with Xarelto.  . Environmental allergies   . Essential hypertension   . Family history of adverse reaction to anesthesia    " My Mother would get deathly sick "  . GERD (gastroesophageal reflux disease)   . Head injury 2011  . Hemorrhoids   . LBBB (left bundle branch block)   . Obesity   . Orthostatic hypotension   . Pneumonia 2012  . 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 2016   a. Dx 04/2015 - patient returned 05/2015 after noncompliance with Xarelto.  . Shortness of breath dyspnea   . Syncope    a. Recurrent syncope in 2016 felt 2/2 orthostasis in setting of PE.   Past Surgical History:  Procedure Laterality Date  . ABDOMINAL HYSTERECTOMY  1986  . ANAL FISSURE REPAIR    . ANTERIOR CERVICAL  DECOMP/DISCECTOMY FUSION  01/23/2012   Procedure: ANTERIOR CERVICAL DECOMPRESSION/DISCECTOMY FUSION 1 LEVEL;  Surgeon: Elaina Hoops, MD;  Location: [facility];  Service: Neurosurgery;  Laterality: N/A;  Cervical five-six anterior cervical discectomy fusion with plating  . APPENDECTOMY    . BREAST SURGERY Left 2011   Breast lumpectomy  . CARDIAC CATHETERIZATION  2010   [facility]; pt states it was "clear"  . CARDIAC CATHETERIZATION N/A 10/28/2015   Procedure: Right/Left Heart Cath and Coronary Angiography;  Surgeon: Belva Crome, MD;  Location: [facility];  Service: Cardiovascular;  Laterality: N/A;  . CARPAL TUNNEL RELEASE Bilateral   . CESAREAN SECTION  1982, 1979  . CHOLECYSTECTOMY    . COLONOSCOPY  01/16/2009   Dr Bary Castilla  . KNEE CARTILAGE SURGERY Left   . SHOULDER ARTHROSCOPY WITH SUBACROMIAL DECOMPRESSION, ROTATOR CUFF REPAIR AND BICEP TENDON REPAIR Right 11/22/2013   Procedure: RIGHT SHOULDER ATHROSCOPY OPEN SUBSCAP REPAIR DELTA-PECTORAL ;  Surgeon: Augustin Schooling, MD;  Location: [facility];  Service: Orthopedics;  Laterality: Right;  . TARSAL TUNNEL RELEASE Left 1990  . TONSILLECTOMY    . TUBAL LIGATION     Family History  Problem Relation Age of Onset  . Breast cancer Paternal Aunt   . Breast cancer Mother 58  . Migraines Mother   . Cancer Mother   . Hypertension Mother   . Rectal cancer Father   . Cancer Father   . COPD Father   . Heart disease Paternal Grandmother   . Stroke Paternal Grandmother   . Heart attack Paternal Grandmother   . Aneurysm Paternal Grandfather   . COPD Paternal Grandfather   . Breast cancer Cousin   . Breast cancer Cousin   . Breast cancer Cousin   . Anesthesia problems Neg Hx   . Diabetes Neg Hx    Social History   Social History  . Marital status: Divorced    Spouse name: N/A  . Number of children: 2  . Years of education: college-1   Occupational History  . Unemployed    Social History Main Topics  . Smoking status:  Never Smoker  . Smokeless tobacco: Never Used  . Alcohol use Yes     Comment: occasional wine  . Drug use: No  . Sexual activity: No   Other Topics Concern  . Not on file   Social History Narrative  . No narrative on file    Interim medical  history since last visit reviewed. Allergies and medications reviewed  Review of Systems Per HPI unless specifically indicated above     Objective:    BP 112/72   Pulse 97   Temp 98.5 F (36.9 C) (Oral)   Resp 14   Wt 238 lb 6.4 oz (108.1 kg)   SpO2 95%   BMI 40.29 kg/m   Wt Readings from Last 3 Encounters:  06/26/17 238 lb 6.4 oz (108.1 kg)  03/06/17 238 lb 12.8 oz (108.3 kg)  11/25/16 230 lb (104.3 kg)    Physical Exam  Constitutional: She appears well-developed and well-nourished. No distress.  HENT:  Head: Normocephalic and atraumatic.  Cardiovascular: Normal rate and regular rhythm.   Pulmonary/Chest: Effort normal and breath sounds normal.  Musculoskeletal: She exhibits no edema.       Right knee: She exhibits normal alignment, no LCL laxity, normal meniscus and no MCL laxity. Tenderness (only over superior patella itself) found. No medial joint line, no lateral joint line, no MCL, no LCL and no patellar tendon tenderness noted.       Lumbar back: She exhibits tenderness (RIGHT of midline lumbar region) and pain. She exhibits no edema and no spasm.  Crepitus in the RIGHT knee with flex/ext  Skin:     Lesions on the posterior RIGHT calf; lateral LEFT lower leg; indurated, keratotic, more than 5 mm diameter  Psychiatric: Her mood appears not anxious. She does not exhibit a depressed mood.   Results for orders placed or performed during the hospital encounter of 03/14/17  Comp Met (CMET)  Result Value Ref Range   Sodium 137 135 - 145 mmol/L   Potassium 3.9 3.5 - 5.1 mmol/L   Chloride 101 101 - 111 mmol/L   CO2 26 22 - 32 mmol/L   Glucose, Bld 162 (H) 65 - 99 mg/dL   BUN 20 6 - 20 mg/dL   Creatinine, Ser 1.06 (H)  0.44 - 1.00 mg/dL   Calcium 9.8 8.9 - 10.3 mg/dL   Total Protein 7.9 6.5 - 8.1 g/dL   Albumin 4.1 3.5 - 5.0 g/dL   AST 25 15 - 41 U/L   ALT 15 14 - 54 U/L   Alkaline Phosphatase 88 38 - 126 U/L   Total Bilirubin 0.6 0.3 - 1.2 mg/dL   GFR calc non Af Amer 55 (L) >60 mL/min   GFR calc Af Amer >60 >60 mL/min   Anion gap 10 5 - 15  CBC  Result Value Ref Range   WBC 8.7 3.6 - 11.0 K/uL   RBC 4.90 3.80 - 5.20 MIL/uL   Hemoglobin 13.3 12.0 - 16.0 g/dL   HCT 40.0 35.0 - 47.0 %   MCV 81.7 80.0 - 100.0 fL   MCH 27.3 26.0 - 34.0 pg   MCHC 33.4 32.0 - 36.0 g/dL   RDW 15.3 (H) 11.5 - 14.5 %   Platelets 203 150 - 440 K/uL      Assessment & Plan:   Problem List Items Addressed This Visit      Respiratory   Allergic rhinitis    Refills provided of nasal corticosteroid        Other   Insomnia    Explained that I am not comfortable with refilling Elavil 75 mg to use PRN; my understanding is that this medicine should be tapered up and down and is NOT PRN; she was asked to contact her neurologist to discuss this; if he has truly turned her loose and wishes  to have me prescribe, I'll need something in writing such as office notes or phone notes to justify writing Rx this way      Headache (Chronic)    Explained that I am not comfortable with refilling topamax at 100 mg to use PRN; my understanding is that this medicine should be tapered up and down and is NOT PRN; she was asked to contact her neurologist to discuss this; if he has truly turned her loose and wishes to have me prescribe, I'll need something in writing such as office notes or phone notes to justify writing Rx this way      Relevant Medications   tiZANidine (ZANAFLEX) 2 MG tablet    Other Visit Diagnoses    Acute pain of right knee    -  Primary   tender over patella; discussed xray, will pass (lots of lifetime radiation), but call if not resolving   Needs flu shot       Relevant Orders   Flu Vaccine QUAD 6+ mos PF IM  (Fluarix Quad PF) (Completed)   Neoplasm of uncertain behavior of skin of lower leg       Relevant Orders   Ambulatory referral to Dermatology   Strain of lumbar paraspinal muscle, initial encounter       likely secondary to recent trip up stairs, hitting right knee; will use the T's (topicals, temperature, tylenol, etc.); no narcotics; tizanidine PRN, NO driving       Follow up plan: Return in about 2 weeks (around 07/10/2017) for complete physical.  An after-visit summary was printed and given to the patient at [place].  Please see the patient instructions which may contain other information and recommendations beyond what is mentioned above in the assessment and plan.  Meds ordered this encounter  Medications  . fluticasone (FLONASE) 50 MCG/ACT nasal spray    Sig: Place 1 spray into both nostrils daily as needed for allergies or rhinitis.    Dispense:  16 g    Refill:  11    Leave on file at pharmacy please until patient calls; does not need right now  . levalbuterol (XOPENEX HFA) 45 MCG/ACT inhaler    Sig: Inhale 2 puffs into the lungs every 6 (six) hours as needed for wheezing.    Dispense:  1 Inhaler    Refill:  1    May leave on file until patient calls for this; thank you  . tiZANidine (ZANAFLEX) 2 MG tablet    Sig: TAKE 1 TO 2 TABLETS BY MOUTH EVERY EIGHT HOURS AS NEEDED FOR MUSCLE SPASMS    Dispense:  30 tablet    Refill:  0    Orders Placed This Encounter  Procedures  . Flu Vaccine QUAD 6+ mos PF IM (Fluarix Quad PF)  . Ambulatory referral to Dermatology    Do not drive while on tizanidine, can cause impairment, explained to patient at appointment when I originally prescribed it for bedtime only; she agrees she will not drive on this

## 2017-06-26 NOTE — Assessment & Plan Note (Signed)
Refills provided of nasal corticosteroid

## 2017-06-26 NOTE — Assessment & Plan Note (Signed)
Explained that I am not comfortable with refilling topamax at 100 mg to use PRN; my understanding is that this medicine should be tapered up and down and is NOT PRN; she was asked to contact her neurologist to discuss this; if he has truly turned her loose and wishes to have me prescribe, I'll need something in writing such as office notes or phone notes to justify writing Rx this way

## 2017-06-26 NOTE — Assessment & Plan Note (Signed)
Explained that I am not comfortable with refilling Elavil 75 mg to use PRN; my understanding is that this medicine should be tapered up and down and is NOT PRN; she was asked to contact her neurologist to discuss this; if he has truly turned her loose and wishes to have me prescribe, I'll need something in writing such as office notes or phone notes to justify writing Rx this way

## 2017-06-26 NOTE — Patient Instructions (Addendum)
I'll recommend tylenol no more than 3,000 mg daily (per 24 hours) Try turmeric as a natural anti-inflammatory (for pain and arthritis). It comes in capsules where you buy aspirin and fish oil, but also as a spice where you buy pepper and garlic powder. Try topicals Heat or ice as long as you watch for thermal injury to the skin If you develop loss of control of bowel or bladder, go to the ER immediately Let me know if you'd like to see physical therapy for this if not improving Please clarify your amitriptyline instructions with Dr. Jannifer Franklin' office I do not think that medicine is meant to be used "as needed" or PRN at that dose Please also clarify the topirimate instructions with Dr. Jannifer Franklin Please request Dr. Jannifer Franklin' last office note (and any instructions related to the amitriptyline and topamax)

## 2017-06-27 ENCOUNTER — Other Ambulatory Visit: Payer: Self-pay | Admitting: Family Medicine

## 2017-06-27 MED ORDER — ALBUTEROL SULFATE HFA 108 (90 BASE) MCG/ACT IN AERS: 2.0000 | INHALATION_SPRAY | RESPIRATORY_TRACT | 1 refills | Status: DC | PRN

## 2017-06-27 NOTE — Progress Notes (Signed)
xopenex not covered; Rx for proair sent per pharmacy recommendations

## 2017-06-28 ENCOUNTER — Other Ambulatory Visit: Payer: Self-pay | Admitting: Family Medicine

## 2017-06-28 DIAGNOSIS — Z1231 Encounter for screening mammogram for malignant neoplasm of breast: Secondary | ICD-10-CM

## 2017-07-03 ENCOUNTER — Ambulatory Visit (INDEPENDENT_AMBULATORY_CARE_PROVIDER_SITE_OTHER): Payer: Medicare Other | Admitting: Nurse Practitioner

## 2017-07-03 ENCOUNTER — Telehealth (HOSPITAL_COMMUNITY): Payer: Self-pay

## 2017-07-03 ENCOUNTER — Other Ambulatory Visit: Payer: Self-pay | Admitting: *Deleted

## 2017-07-03 ENCOUNTER — Encounter: Payer: Self-pay | Admitting: Nurse Practitioner

## 2017-07-03 VITALS — BP 128/76 | HR 88 | Ht 64.5 in | Wt 241.0 lb

## 2017-07-03 DIAGNOSIS — I2692 Saddle embolus of pulmonary artery without acute cor pulmonale: Secondary | ICD-10-CM | POA: Diagnosis not present

## 2017-07-03 DIAGNOSIS — I5022 Chronic systolic (congestive) heart failure: Secondary | ICD-10-CM

## 2017-07-03 DIAGNOSIS — I2602 Saddle embolus of pulmonary artery with acute cor pulmonale: Secondary | ICD-10-CM

## 2017-07-03 DIAGNOSIS — Z7901 Long term (current) use of anticoagulants: Secondary | ICD-10-CM | POA: Diagnosis not present

## 2017-07-03 DIAGNOSIS — I10 Essential (primary) hypertension: Secondary | ICD-10-CM

## 2017-07-03 MED ORDER — RIVAROXABAN 20 MG PO TABS: ORAL_TABLET | ORAL | 3 refills | Status: DC

## 2017-07-03 MED ORDER — CARVEDILOL 6.25 MG PO TABS: 6.2500 mg | ORAL_TABLET | Freq: Two times a day (BID) | ORAL | 3 refills | Status: DC

## 2017-07-03 NOTE — Telephone Encounter (Signed)
Patients insurance is active and benefits verified with [facility] - $20 co-pay, no deductible, out of pocket amount is $6,700/$213.22 has been met, no co-insurance, and no pre-authorization is required. Passport/reference [id number]

## 2017-07-03 NOTE — Patient Instructions (Addendum)
We will be checking the following labs today - NONE   Medication Instructions:    Continue with your current medicines.   I sent in your refills today    Testing/Procedures To Be Arranged:  Limited echo in December to recheck EF  Follow-Up:   See Dr. Tamala Julian after echo for discussion - possible EP referral    Other Special Instructions:  Here are my tips to lose weight:  1. Drink only water. You do not need milk, juice, tea, soda or diet soda.  2. Do not eat anything "white". This includes white bread, potatoes, rice or mayo  3. Stay away from fried foods and sweets  4. Your portion should be the size of the palm of your hand.  5. Know what your weaknesses are and avoid.   6. Find an exercise you like and do it every day for 45 to 60 minutes.          If you need a refill on your cardiac medications before your next appointment, please call your pharmacy.   Call the [place] office at [phone] if you have any questions, problems or concerns.

## 2017-07-03 NOTE — Progress Notes (Signed)
Belva Crome, MD  Arnetha Courser, MD  Cc: Loren Racer, LPN        Please see if we can enroll Mrs. Goeken in cardiac rehabilitation. Diagnosis is recent congestive heart failure.    Referral placed for cardiac rehab

## 2017-07-03 NOTE — Telephone Encounter (Signed)
Spoke with patient in regards to Cardiac Rehab - She stated that she has an ECHO scheduled in December and potentially a procedure after. She would like to f/u at the beginning of the year.

## 2017-07-03 NOTE — Progress Notes (Addendum)
CARDIOLOGY OFFICE NOTE  Date:  07/03/2017    Patricia Fischer  Date of Birth: November 30, 1955 Medical Record #[id number]  PCP:  Arnetha Courser, MD  Cardiologist:  Tamala Julian   Chief Complaint  Patient presents with  . Congestive Heart Failure    Follow up visit - seen for Dr. Tamala Julian    History of Present Illness: Patricia Fischer is a 61 y.o. female who presents today for a 4 month check. Seen for Dr. Tamala Julian.   She has a history of an old pulmonary embolism, chronic systolic heart failure, essential hypertension, LBBB and chronic anticoagulation therapy. EF on the last echo was 30-35% from May of 2018. Soft BP has limited medical therapy. She is on chronic disability.   Last seen back in July - some cramps but overall doing ok.   Comes in today. Here alone. She is doing ok - says she remains fatigued. She will get short of breath. She went to the beach earlier this month - the elevator was out of service. She had to walk up 3 flights of stairs. She was very winded after 2 flights. She had to stop multiple times. She actually trying to go up the stairs. Tries to be as active as she can but limited by breathing/fatigue. Frustrated that she can't lose weight. Loves "junk food and sweet tea". She is now on the full dose of Losartan - can take every day. Not as dizzy any more. BP still limiting her regimen. No palpitations. She has had some neck/shoulder issues - has seen her PCP and was put on muscle relaxers. Getting her physical with complete labs in less than 2 weeks by her PCP. No problems with her Xarelto. She admits that overall she feels "fearful".   Past Medical History:  Diagnosis Date  . Anxiety   . Asthma   . Blood transfusion [place]  . Breast cancer ([facility]) 04/26/2008   left breast  . Breast cancer, left breast ([facility]) 2011   DCIS  . Chronic migraine without aura, with intractable migraine, so stated, with status migrainosus March 2016  . Chronic systolic CHF  (congestive heart failure) ([facility]) 05/05/2015   a. Dx 04/2015 - EF initially 25-30% with RV dysfunction, f/u echo 08/2015 technically difficult, EF 35-40%, anterior, anteroseptal, apical and inferoapical severe hypokinesis suggestive of LAD territory ischemia/infarct, grade 1 DD, mild MR, RV normal.  . Depression   . DVT (deep venous thrombosis) ([facility]) Aug. 2016   a. Dx 04/2015 - patient returned 05/2015 after noncompliance with Xarelto.  . Environmental allergies   . Essential hypertension   . Family history of adverse reaction to anesthesia    " My Mother would get deathly sick "  . GERD (gastroesophageal reflux disease)   . Head injury 2011  . Hemorrhoids   . LBBB (left bundle branch block)   . Obesity   . Orthostatic hypotension   . Pneumonia 2012  . Saddle pulmonary embolus ([facility]) Aug. 2016   a. Dx 04/2015 - patient returned 05/2015 after noncompliance with Xarelto.  . Shortness of breath dyspnea   . Syncope    a. Recurrent syncope in 2016 felt 2/2 orthostasis in setting of PE.    Past Surgical History:  Procedure Laterality Date  . ABDOMINAL HYSTERECTOMY  1986  . ANAL FISSURE REPAIR    . ANTERIOR CERVICAL DECOMP/DISCECTOMY FUSION  01/23/2012   Procedure: ANTERIOR CERVICAL DECOMPRESSION/DISCECTOMY FUSION 1 LEVEL;  Surgeon: Elaina Hoops, MD;  Location: [facility];  Service: Neurosurgery;  Laterality: N/A;  Cervical five-six anterior cervical discectomy fusion with plating  . APPENDECTOMY    . BREAST SURGERY Left 2011   Breast lumpectomy  . CARDIAC CATHETERIZATION  2010   [facility]; pt states it was "clear"  . CARDIAC CATHETERIZATION N/A 10/28/2015   Procedure: Right/Left Heart Cath and Coronary Angiography;  Surgeon: Belva Crome, MD;  Location: [facility];  Service: Cardiovascular;  Laterality: N/A;  . CARPAL TUNNEL RELEASE Bilateral   . CESAREAN SECTION  1982, 1979  . CHOLECYSTECTOMY    . COLONOSCOPY  01/16/2009   Dr Bary Castilla  . KNEE CARTILAGE SURGERY Left   .  SHOULDER ARTHROSCOPY WITH SUBACROMIAL DECOMPRESSION, ROTATOR CUFF REPAIR AND BICEP TENDON REPAIR Right 11/22/2013   Procedure: RIGHT SHOULDER ATHROSCOPY OPEN SUBSCAP REPAIR DELTA-PECTORAL ;  Surgeon: Augustin Schooling, MD;  Location: [facility];  Service: Orthopedics;  Laterality: Right;  . TARSAL TUNNEL RELEASE Left 1990  . TONSILLECTOMY    . TUBAL LIGATION       Medications: Current Meds  Medication Sig  . albuterol (PROAIR HFA) 108 (90 Base) MCG/ACT inhaler Inhale 2 puffs into the lungs every 4 (four) hours as needed for wheezing or shortness of breath.  Marland Kitchen amitriptyline (ELAVIL) 25 MG tablet Take 3 tablets (75 mg total) by mouth at bedtime.  . carvedilol (COREG) 6.25 MG tablet Take 1 tablet (6.25 mg total) by mouth 2 (two) times daily with a meal.  . fluticasone (FLONASE) 50 MCG/ACT nasal spray Place 1 spray into both nostrils daily as needed for allergies or rhinitis.  . rivaroxaban (XARELTO) 20 MG TABS tablet TAKE 1 TABLET BY MOUTH DAILY WITH SUPPER.  Marland Kitchen spironolactone (ALDACTONE) 25 MG tablet TAKE 1 TABLET BY MOUTH EVERY MONDAY, WEDNESDAY, AND FRIDAY.  Marland Kitchen tiZANidine (ZANAFLEX) 2 MG tablet TAKE 1 TO 2 TABLETS BY MOUTH EVERY EIGHT HOURS AS NEEDED FOR MUSCLE SPASMS  . topiramate (TOPAMAX) 100 MG tablet Take 100 mg by mouth at bedtime as needed (headaches).  . [DISCONTINUED] carvedilol (COREG) 6.25 MG tablet Take 6.25 mg by mouth 2 (two) times daily with a meal.  . [DISCONTINUED] XARELTO 20 MG TABS tablet TAKE 1 TABLET BY MOUTH DAILY WITH SUPPER.     Allergies: Allergies  Allergen Reactions  . Gadolinium Derivatives Shortness Of Breath and Nausea And Vomiting  . Sulfa Antibiotics Swelling    "Ears swelled up like Dumbo"  . Aripiprazole     Dry mouth with sores   . Codeine Itching and Rash  . Oxycodone Itching  . Penicillins Hives    Has patient had a PCN reaction causing immediate rash, facial/tongue/throat swelling, SOB or lightheadedness with hypotension: Yes Has patient had a PCN  reaction causing severe rash involving mucus membranes or skin necrosis: No Has patient had a PCN reaction that required hospitalization No Has patient had a PCN reaction occurring within the last 10 years: No If all of the above answers are "NO", then may proceed with Cephalosporin use.    Social History: The patient  reports that she has never smoked. She has never used smokeless tobacco. She reports that she drinks alcohol. She reports that she does not use drugs.   Family History: The patient's family history includes Aneurysm in her paternal grandfather; Breast cancer in her cousin, cousin, cousin, and paternal aunt; Breast cancer (age of onset: 18) in her mother; COPD in her father and paternal grandfather; Cancer in her father and mother; Heart attack  in her paternal grandmother; Heart disease in her paternal grandmother; Hypertension in her mother; Migraines in her mother; Rectal cancer in her father; Stroke in her paternal grandmother.   Review of Systems: Please see the history of present illness.   Otherwise, the review of systems is positive for none.   All other systems are reviewed and negative.   Physical Exam: VS:  BP 128/76   Pulse 88   Ht 5' 4.5" (1.638 m)   Wt 241 lb (109.3 kg)   SpO2 97%   BMI 40.73 kg/m  .  BMI Body mass index is 40.73 kg/m.  Wt Readings from Last 3 Encounters:  07/03/17 241 lb (109.3 kg)  06/26/17 238 lb 6.4 oz (108.1 kg)  03/06/17 238 lb 12.8 oz (108.3 kg)    General: Pleasant. Obese. Alert and in no acute distress.   HEENT: Normal.  Neck: Supple, no JVD, carotid bruits, or masses noted.  Cardiac: Regular rate and rhythm. No murmurs, rubs, or gallops. No edema.  Respiratory:  Lungs are clear to auscultation bilaterally with normal work of breathing.  GI: Soft and nontender.  MS: No deformity or atrophy. Gait and ROM intact.  Skin: Warm and dry. Color is normal.  Neuro:  Strength and sensation are intact and no gross focal deficits noted.    Psych: Alert, appropriate and with normal affect.   LABORATORY DATA:  EKG:  EKG is not ordered today.  Lab Results  Component Value Date   WBC 8.7 03/14/2017   HGB 13.3 03/14/2017   HCT 40.0 03/14/2017   PLT 203 03/14/2017   GLUCOSE 162 (H) 03/14/2017   CHOL 125 05/17/2016   TRIG 92 05/17/2016   HDL 51 05/17/2016   LDLCALC 56 05/17/2016   ALT 15 03/14/2017   AST 25 03/14/2017   NA 137 03/14/2017   K 3.9 03/14/2017   CL 101 03/14/2017   CREATININE 1.06 (H) 03/14/2017   BUN 20 03/14/2017   CO2 26 03/14/2017   TSH 0.944 03/22/2016   INR 1.09 10/27/2015     BNP (last 3 results) No results for input(s): BNP in the last 8760 hours.  ProBNP (last 3 results) No results for input(s): PROBNP in the last 8760 hours.   Other Studies Reviewed Today:  Echocardiogram May 2018:  ------------------------------------------------------------------- Study Conclusions  - Left ventricle: The cavity size was normal. Wall thickness was increased in a pattern of mild LVH. Systolic function was moderately to severely reduced. The estimated ejection fraction was in the range of 30% to 35%. Diffuse hypokinesis. Doppler parameters are consistent with abnormal left ventricular relaxation (grade 1 diastolic dysfunction).  Impressions:  - Moderate to severe global reduction in LV systolic function; mild diastolic dysfunction; trace MR and TR.  Assessment/Plan:  1. NICM with LVEF still hovering in the 30-40% range but with this last echo from May - it was suggested that EF is 30-35%. She has known left bundle branch block. She is not on ideal therapy - due to soft BP and dizzy spells and I don't think we are going to be able to titrate up any more of her medicines. She has now been able to be on 25 mg of Losartan and can take every day without too much dizziness. Will get limited echo and follow up afterwards. May need to consider consult to EP for their input on   possible BiV +/ICD. May need to consider cardiac MRI for definitive EF. I think her fatigue and DOE are heart failure  related but certainly her weight is playing an issue as well.  2. History of PE - on chronic anticoagulation 3. Chronic anticoagulation - no problems noted. She is on indefinite therapy.  4. Obesity - discussed at length. My tips for weight loss given to her today. She admits she is getting too much "junk".  Continue anticoagulation therapy indefinitely.   Current medicines are reviewed with the patient today.  The patient does not have concerns regarding medicines other than what has been noted above.  The following changes have been made:  See above.  Labs/ tests ordered today include:    Orders Placed This Encounter  Procedures  . ECHOCARDIOGRAM LIMITED     Disposition:   FU with Dr. Tamala Julian after limited echo.   Patient is agreeable to this plan and will call if any problems develop in the interim.   SignedTruitt Merle, NP  07/03/2017 1:56 PM  [facility] [address] [place], [place]  [address] Phone: [phone] Fax: [phone]      Addendum: Discussed with Dr. Tamala Julian here in the office this morning - will check cardiac MRI instead of the limited echo and keep follow up with Dr. Tamala Julian. Will refer for EP evaluation for possible device if EF less than 40%.   Burtis Junes, RN, [facility] [address] [place], [place]  [address] [phone]

## 2017-07-04 ENCOUNTER — Other Ambulatory Visit: Payer: Self-pay | Admitting: Nurse Practitioner

## 2017-07-04 ENCOUNTER — Telehealth: Payer: Self-pay | Admitting: *Deleted

## 2017-07-04 MED ORDER — DIAZEPAM 10 MG PO TABS: 10.0000 mg | ORAL_TABLET | ORAL | 0 refills | Status: AC

## 2017-07-04 NOTE — Telephone Encounter (Signed)
Ok to give 10 mg of Valium about 30 minutes before.  She will need someone to drive her.   I can print her a RX if she is agreeable.

## 2017-07-04 NOTE — Telephone Encounter (Signed)
-----   Message from Burtis Junes, NP sent at 07/04/2017  9:18 AM EDT ----- Anderson Malta,  Can you call Ms. Grandville Silos - let her know that I talked with Dr. Tamala Julian - we would rather her have cardiac MRI in the place of the limited echo for more definitive evaluation of her EF.   I put the order in for the MRI  Ok to cancel the limited echo.  lori

## 2017-07-04 NOTE — Telephone Encounter (Signed)
Spoke with Patricia Fischer and went over recommendations per Truitt Merle, NP and Dr. Tamala Julian.  Patricia Fischer willing to proceed with MRI but states she gets very anxious with MRI's and usually requires pre medication.  Advised I would send message to [facility] for review.

## 2017-07-04 NOTE — Addendum Note (Signed)
Addended by: Burtis Junes on: 07/04/2017 09:17 AM   Modules accepted: Orders

## 2017-07-04 NOTE — Telephone Encounter (Signed)
Pt is agreeable to Valium 10mg - 30 mins prior to MRI.  She's aware she will need a driver.  Pt appreciative for call.

## 2017-07-04 NOTE — Progress Notes (Signed)
Arranging cardiac MRI - patient expresses desire for sedation. Will give one time dose of Valium 10 mg Will need transportation arrived - she is not to drive.   Burtis Junes, RN, [facility] [address] [place], [place] [employer]  [address] [phone]

## 2017-07-11 ENCOUNTER — Telehealth: Payer: Self-pay | Admitting: Family Medicine

## 2017-07-11 ENCOUNTER — Ambulatory Visit (INDEPENDENT_AMBULATORY_CARE_PROVIDER_SITE_OTHER): Payer: Medicare Other | Admitting: Family Medicine

## 2017-07-11 ENCOUNTER — Encounter: Payer: Self-pay | Admitting: Family Medicine

## 2017-07-11 VITALS — BP 124/74 | HR 85 | Temp 99.0°F | Resp 14 | Ht 64.0 in | Wt 245.8 lb

## 2017-07-11 DIAGNOSIS — Z0000 Encounter for general adult medical examination without abnormal findings: Secondary | ICD-10-CM

## 2017-07-11 DIAGNOSIS — I5022 Chronic systolic (congestive) heart failure: Secondary | ICD-10-CM | POA: Diagnosis not present

## 2017-07-11 DIAGNOSIS — I10 Essential (primary) hypertension: Secondary | ICD-10-CM

## 2017-07-11 MED ORDER — LIRAGLUTIDE -WEIGHT MANAGEMENT 18 MG/3ML ~~LOC~~ SOPN: 0.6000 mg | PEN_INJECTOR | Freq: Every day | SUBCUTANEOUS | 0 refills | Status: DC

## 2017-07-11 MED ORDER — PEN NEEDLES 31G X 6 MM MISC: 0 refills | Status: DC

## 2017-07-11 NOTE — Patient Instructions (Addendum)
We'll get labs today If you have not heard anything from my staff in a week about any orders/referrals/studies from today, please contact us here to follow-up [phone] We'll see if Dr. Tamala Julian agrees with Kirke Shaggy Check out the information at [email or web address] entitled "Nutrition for Weight Loss: What You Need to Know about Fad Diets" Try to lose between 1-2 pounds per week by taking in fewer calories and burning off more calories You can succeed by limiting portions, limiting foods dense in calories and fat, becoming more active, and drinking 8 glasses of water a day (64 ounces) Don't skip meals, especially breakfast, as skipping meals may alter your metabolism Do not use over-the-counter weight loss pills or gimmicks that claim rapid weight loss A healthy BMI (or body mass index) is between 18.5 and 24.9 You can calculate your ideal BMI at the [place] website [employer] Health Maintenance, Female Adopting a healthy lifestyle and getting preventive care can go a long way to promote health and wellness. Talk with your health care provider about what schedule of regular examinations is right for you. This is a good chance for you to check in with your provider about disease prevention and staying healthy. In between checkups, there are plenty of things you can do on your own. Experts have done a lot of research about which lifestyle changes and preventive measures are most likely to keep you healthy. Ask your health care provider for more information. Weight and diet Eat a healthy diet  Be sure to include plenty of vegetables, fruits, low-fat dairy products, and lean protein.  Do not eat a lot of foods high in solid fats, added sugars, or salt.  Get regular exercise. This is one of the most important things you can do for your health. ? Most adults should exercise for at least 150 minutes each week. The exercise should increase your heart  rate and make you sweat (moderate-intensity exercise). ? Most adults should also do strengthening exercises at least twice a week. This is in addition to the moderate-intensity exercise.  Maintain a healthy weight  Body mass index (BMI) is a measurement that can be used to identify possible weight problems. It estimates body fat based on height and weight. Your health care provider can help determine your BMI and help you achieve or maintain a healthy weight.  For females 13 years of age and older: ? A BMI below 18.5 is considered underweight. ? A BMI of 18.5 to 24.9 is normal. ? A BMI of 25 to 29.9 is considered overweight. ? A BMI of 30 and above is considered obese.  Watch levels of cholesterol and blood lipids  You should start having your blood tested for lipids and cholesterol at 62 years of age, then have this test every 5 years.  You may need to have your cholesterol levels checked more often if: ? Your lipid or cholesterol levels are high. ? You are older than 62 years of age. ? You are at high risk for heart disease.  Cancer screening Lung Cancer  Lung cancer screening is recommended for adults 30-56 years old who are at high risk for lung cancer because of a history of smoking.  A yearly low-dose CT scan of the lungs is recommended for people who: ? Currently smoke. ? Have quit within the past 15 years. ? Have at least a 30-pack-year history of smoking. A pack year is smoking an average of one pack of cigarettes a day for 1 year.  Yearly  screening should continue until it has been 15 years since you quit.  Yearly screening should stop if you develop a health problem that would prevent you from having lung cancer treatment.  Breast Cancer  Practice breast self-awareness. This means understanding how your breasts normally appear and feel.  It also means doing regular breast self-exams. Let your health care provider know about any changes, no matter how small.  If  you are in your 20s or 30s, you should have a clinical breast exam (CBE) by a health care provider every 1-3 years as part of a regular health exam.  If you are 35 or older, have a CBE every year. Also consider having a breast X-ray (mammogram) every year.  If you have a family history of breast cancer, talk to your health care provider about genetic screening.  If you are at high risk for breast cancer, talk to your health care provider about having an MRI and a mammogram every year.  Breast cancer gene (BRCA) assessment is recommended for women who have family members with BRCA-related cancers. BRCA-related cancers include: ? Breast. ? Ovarian. ? Tubal. ? Peritoneal cancers.  Results of the assessment will determine the need for genetic counseling and BRCA1 and BRCA2 testing.  Cervical Cancer Your health care provider may recommend that you be screened regularly for cancer of the pelvic organs (ovaries, uterus, and vagina). This screening involves a pelvic examination, including checking for microscopic changes to the surface of your cervix (Pap test). You may be encouraged to have this screening done every 3 years, beginning at age 91.  For women ages 48-65, health care providers may recommend pelvic exams and Pap testing every 3 years, or they may recommend the Pap and pelvic exam, combined with testing for human papilloma virus (HPV), every 5 years. Some types of HPV increase your risk of cervical cancer. Testing for HPV may also be done on women of any age with unclear Pap test results.  Other health care providers may not recommend any screening for nonpregnant women who are considered low risk for pelvic cancer and who do not have symptoms. Ask your health care provider if a screening pelvic exam is right for you.  If you have had past treatment for cervical cancer or a condition that could lead to cancer, you need Pap tests and screening for cancer for at least 20 years after your  treatment. If Pap tests have been discontinued, your risk factors (such as having a new sexual partner) need to be reassessed to determine if screening should resume. Some women have medical problems that increase the chance of getting cervical cancer. In these cases, your health care provider may recommend more frequent screening and Pap tests.  Colorectal Cancer  This type of cancer can be detected and often prevented.  Routine colorectal cancer screening usually begins at 62 years of age and continues through 62 years of age.  Your health care provider may recommend screening at an earlier age if you have risk factors for colon cancer.  Your health care provider may also recommend using home test kits to check for hidden blood in the stool.  A small camera at the end of a tube can be used to examine your colon directly (sigmoidoscopy or colonoscopy). This is done to check for the earliest forms of colorectal cancer.  Routine screening usually begins at age 50.  Direct examination of the colon should be repeated every 5-10 years through 62 years of age. However,  you may need to be screened more often if early forms of precancerous polyps or small growths are found.  Skin Cancer  Check your skin from head to toe regularly.  Tell your health care provider about any new moles or changes in moles, especially if there is a change in a mole's shape or color.  Also tell your health care provider if you have a mole that is larger than the size of a pencil eraser.  Always use sunscreen. Apply sunscreen liberally and repeatedly throughout the day.  Protect yourself by wearing long sleeves, pants, a wide-brimmed hat, and sunglasses whenever you are outside.  Heart disease, diabetes, and high blood pressure  High blood pressure causes heart disease and increases the risk of stroke. High blood pressure is more likely to develop in: ? People who have blood pressure in the high end of the normal  range (130-139/85-89 mm Hg). ? People who are overweight or obese. ? People who are African American.  If you are 61-8 years of age, have your blood pressure checked every 3-5 years. If you are 42 years of age or older, have your blood pressure checked every year. You should have your blood pressure measured twice-once when you are at a hospital or clinic, and once when you are not at a hospital or clinic. Record the average of the two measurements. To check your blood pressure when you are not at a hospital or clinic, you can use: ? An automated blood pressure machine at a pharmacy. ? A home blood pressure monitor.  If you are between 13 years and 3 years old, ask your health care provider if you should take aspirin to prevent strokes.  Have regular diabetes screenings. This involves taking a blood sample to check your fasting blood sugar level. ? If you are at a normal weight and have a low risk for diabetes, have this test once every three years after 62 years of age. ? If you are overweight and have a high risk for diabetes, consider being tested at a younger age or more often. Preventing infection Hepatitis B  If you have a higher risk for hepatitis B, you should be screened for this virus. You are considered at high risk for hepatitis B if: ? You were born in a country where hepatitis B is common. Ask your health care provider which countries are considered high risk. ? Your parents were born in a high-risk country, and you have not been immunized against hepatitis B (hepatitis B vaccine). ? You have HIV or AIDS. ? You use needles to inject street drugs. ? You live with someone who has hepatitis B. ? You have had sex with someone who has hepatitis B. ? You get hemodialysis treatment. ? You take certain medicines for conditions, including cancer, organ transplantation, and autoimmune conditions.  Hepatitis C  Blood testing is recommended for: ? Everyone born from [birth year] through  1966. ? Anyone with known risk factors for hepatitis C.  Sexually transmitted infections (STIs)  You should be screened for sexually transmitted infections (STIs) including gonorrhea and chlamydia if: ? You are sexually active and are younger than 62 years of age. ? You are older than 62 years of age and your health care provider tells you that you are at risk for this type of infection. ? Your sexual activity has changed since you were last screened and you are at an increased risk for chlamydia or gonorrhea. Ask your health care provider if you  are at risk.  If you do not have HIV, but are at risk, it may be recommended that you take a prescription medicine daily to prevent HIV infection. This is called pre-exposure prophylaxis (PrEP). You are considered at risk if: ? You are sexually active and do not regularly use condoms or know the HIV status of your partner(s). ? You take drugs by injection. ? You are sexually active with a partner who has HIV.  Talk with your health care provider about whether you are at high risk of being infected with HIV. If you choose to begin PrEP, you should first be tested for HIV. You should then be tested every 3 months for as long as you are taking PrEP. Pregnancy  If you are premenopausal and you may become pregnant, ask your health care provider about preconception counseling.  If you may become pregnant, take 400 to 800 micrograms (mcg) of folic acid every day.  If you want to prevent pregnancy, talk to your health care provider about birth control (contraception). Osteoporosis and menopause  Osteoporosis is a disease in which the bones lose minerals and strength with aging. This can result in serious bone fractures. Your risk for osteoporosis can be identified using a bone density scan.  If you are 11 years of age or older, or if you are at risk for osteoporosis and fractures, ask your health care provider if you should be screened.  Ask your health  care provider whether you should take a calcium or vitamin D supplement to lower your risk for osteoporosis.  Menopause may have certain physical symptoms and risks.  Hormone replacement therapy may reduce some of these symptoms and risks. Talk to your health care provider about whether hormone replacement therapy is right for you. Follow these instructions at home:  Schedule regular health, dental, and eye exams.  Stay current with your immunizations.  Do not use any tobacco products including cigarettes, chewing tobacco, or electronic cigarettes.  If you are pregnant, do not drink alcohol.  If you are breastfeeding, limit how much and how often you drink alcohol.  Limit alcohol intake to no more than 1 drink per day for nonpregnant women. One drink equals 12 ounces of beer, 5 ounces of wine, or 1 ounces of hard liquor.  Do not use street drugs.  Do not share needles.  Ask your health care provider for help if you need support or information about quitting drugs.  Tell your health care provider if you often feel depressed.  Tell your health care provider if you have ever been abused or do not feel safe at home. This information is not intended to replace advice given to you by your health care provider. Make sure you discuss any questions you have with your health care provider. Document Released: 03/07/2011 Document Revised: 01/28/2016 Document Reviewed: 05/26/2015 Elsevier Interactive Patient Education  [employer].

## 2017-07-11 NOTE — Telephone Encounter (Signed)
Please let the patient know that Dr. Tamala Julian agrees with starting Saxenda; please offer savings card; I sent in prescription for the medicine and the pen needles; pharmacy can teach her there how to use this; if any problems, then stop and call right away; if any severe abdominal pain, go to the ER (rare chance of pancreatitis); thank you

## 2017-07-11 NOTE — Telephone Encounter (Signed)
-----   Message from Belva Crome, MD sent at 07/11/2017  5:30 PM EST ----- Regarding: RE: question about weight loss medicine I do not have any experience with this.  I read about it and see no absolute contraindication. ----- Message ----- From: Arnetha Courser, MD Sent: 07/11/2017   2:39 PM To: Belva Crome, MD Subject: question about weight loss medicine            Greetings! I'm seeing our mutual patient right now. She is frustrated with her weight, and I'm wondering if you think Saxenda would be appropriate for her given her heart condition. I would be happy to prescribe this if you don't know of any contraindications. Thank you, Rip Harbour

## 2017-07-11 NOTE — Progress Notes (Addendum)
Patient ID: Patricia Fischer, female   DOB: 08/25/1956, 61 y.o.   MRN: [id number]   Subjective:   Patricia Fischer is a 61 y.o. female here for a complete physical exam  Interim issues since last visit: her knee is not any better She is having a heart MRI; going to have defibrillator, seeing Dr. Tamala Julian; I had suggested cardiopulmonary rehab and she is going to wait until other stuff is finished She is putting on weight, not eating any differently; they told her to give up white things; they told her not to do it all at once; they told her to not get discouraged; not eating fried foods; no energy because of her heart I offered medicine No hx of thyroid cancer; no MEN-2 in the family  USPSTF grade A and B recommendations Depression:  Depression screen [facility] 2/9 07/11/2017 06/26/2017 08/04/2016 07/27/2016 03/22/2016  Decreased Interest 0 0 0 0 0  Down, Depressed, Hopeless 0 0 0 0 0  PHQ - 2 Score 0 0 0 0 0  Altered sleeping - - - - -  Tired, decreased energy - - - - -  Change in appetite - - - - -  Feeling bad or failure about yourself  - - - - -  Trouble concentrating - - - - -  Moving slowly or fidgety/restless - - - - -  Suicidal thoughts - - - - -  PHQ-9 Score - - - - -   Hypertension: BP Readings from Last 3 Encounters:  09/10/17 (!) 100/52  09/07/17 134/86  08/22/17 136/90   Obesity: Wt Readings from Last 3 Encounters:  09/07/17 240 lb (108.9 kg)  09/07/17 237 lb 14.4 oz (107.9 kg)  08/22/17 241 lb 12.8 oz (109.7 kg)   BMI Readings from Last 3 Encounters:  09/07/17 41.20 kg/m  09/07/17 40.84 kg/m  08/22/17 41.50 kg/m     Skin cancer: no worrisome moles Lung cancer:  nonsmoker Breast cancer: hx of breast cancer, had lumpectomy 10 years ago and had another procedure last year with clip; no new lumps or bumps Colorectal cancer: patient refuses another colonoscopy  BRCA gene screening: family hx of breast and/or ovarian cancer and/or metastatic prostate cancer?  Mother was adopted, mother had breast cancer; two paternal aunts with breast cancer; cousin also with breast cancer, all on the left; no ovarian cancer; father had rectal cancer  Cervical cancer screening:  s/p hysterectomy, noncancerous reasons, had IUD, kept bleeding HIV, hep B, hep C: done STD testing and prevention (chl/gon/syphilis): not necessary Intimate partner violence: no abuse Contraception: n/a Osteoporosis: next will be age 17 Fall prevention/vitamin D: discussed; taking calcium and D  Diet:  Exercise: limited b/c her heart; does stand some, keeps trying to walk; Dr. Tamala Julian told her "don't give up" Alcohol: occasional Tobacco use: never Aspirin: not taking, cardiologist stopped it Lipids: check today, fasting Lab Results  Component Value Date   CHOL 172 07/11/2017   CHOL 125 05/17/2016   CHOL 171 09/18/2015   Lab Results  Component Value Date   HDL 40 (L) 07/11/2017   HDL 51 05/17/2016   HDL 51 09/18/2015   Lab Results  Component Value Date   LDLCALC 56 05/17/2016   [place] 95 09/18/2015   [place] 50 08/25/2015   Lab Results  Component Value Date   TRIG 161 (H) 07/11/2017   TRIG 92 05/17/2016   TRIG 123 09/18/2015   Lab Results  Component Value Date   CHOLHDL 4.3 07/11/2017  CHOLHDL 2.5 05/17/2016   CHOLHDL 3.4 09/18/2015   No results found for: LDLDIRECT Glucose: fasting today Glucose, Bld  Date Value Ref Range Status  09/08/2017 125 (H) 65 - 99 mg/dL Final  09/07/2017 148 (H) 65 - 99 mg/dL Final  08/10/2017 103 (H) 65 - 99 mg/dL Final   Past Medical History:  Diagnosis Date  . Anxiety   . Asthma   . Blood transfusion [place]  . Breast cancer ([place]) 04/26/2008   left breast  . Breast cancer, left breast ([place]) 2011   DCIS  . Chronic migraine without aura, with intractable migraine, so stated, with status migrainosus March 2016  . Chronic systolic CHF (congestive heart failure) ([place]) 05/05/2015   a. Dx 04/2015 - EF initially 25-30%  with RV dysfunction, f/u echo 08/2015 technically difficult, EF 35-40%, anterior, anteroseptal, apical and inferoapical severe hypokinesis suggestive of LAD territory ischemia/infarct, grade 1 DD, mild MR, RV normal.  . Depression   . DVT (deep venous thrombosis) ([place]) Aug. 2016   a. Dx 04/2015 - patient returned 05/2015 after noncompliance with Xarelto.  . Environmental allergies   . Essential hypertension   . Family history of adverse reaction to anesthesia    " My Mother would get deathly sick "  . GERD (gastroesophageal reflux disease)   . Head injury 2011  . Hemorrhoids   . LBBB (left bundle branch block)   . Obesity   . Orthostatic hypotension   . Pneumonia 2012  . Saddle pulmonary embolus ([place]) Aug. 2016   a. Dx 04/2015 - patient returned 05/2015 after noncompliance with Xarelto.  . Shortness of breath dyspnea   . Syncope    a. Recurrent syncope in 2016 felt 2/2 orthostasis in setting of PE.   Past Surgical History:  Procedure Laterality Date  . ABDOMINAL HYSTERECTOMY  1986  . ANAL FISSURE REPAIR    . ANTERIOR CERVICAL DECOMP/DISCECTOMY FUSION  01/23/2012   Procedure: ANTERIOR CERVICAL DECOMPRESSION/DISCECTOMY FUSION 1 LEVEL;  Surgeon: Elaina Hoops, MD;  Location: [place] [facility];  Service: Neurosurgery;  Laterality: N/A;  Cervical five-six anterior cervical discectomy fusion with plating  . APPENDECTOMY    . BREAST BIOPSY Left 06/2016   US biopsy  . BREAST SURGERY Left 2011   Breast lumpectomy  . CARDIAC CATHETERIZATION  2010   [facility]; pt states it was "clear"  . CARDIAC CATHETERIZATION N/A 10/28/2015   Procedure: Right/Left Heart Cath and Coronary Angiography;  Surgeon: Belva Crome, MD;  Location: [place] CV [facility];  Service: Cardiovascular;  Laterality: N/A;  . CARPAL TUNNEL RELEASE Bilateral   . CESAREAN SECTION  1982, 1979  . CHOLECYSTECTOMY    . COLONOSCOPY  01/16/2009   Dr Bary Castilla  . KNEE CARTILAGE SURGERY Left   . SHOULDER ARTHROSCOPY WITH SUBACROMIAL  DECOMPRESSION, ROTATOR CUFF REPAIR AND BICEP TENDON REPAIR Right 11/22/2013   Procedure: RIGHT SHOULDER ATHROSCOPY OPEN SUBSCAP REPAIR DELTA-PECTORAL ;  Surgeon: Augustin Schooling, MD;  Location: [place];  Service: Orthopedics;  Laterality: Right;  . TARSAL TUNNEL RELEASE Left 1990  . TONSILLECTOMY    . TUBAL LIGATION     Family History  Problem Relation Age of Onset  . Breast cancer Paternal Aunt   . Breast cancer Mother 39  . Migraines Mother   . Cancer Mother   . Hypertension Mother   . Rectal cancer Father   . Cancer Father   . COPD Father   . Heart disease Paternal Grandmother   .  Stroke Paternal Grandmother   . Heart attack Paternal Grandmother   . Aneurysm Paternal Grandfather   . COPD Paternal Grandfather   . Breast cancer Cousin   . Breast cancer Cousin   . Breast cancer Cousin   . Anesthesia problems Neg Hx   . Diabetes Neg Hx    Social History   Tobacco Use  . Smoking status: Never Smoker  . Smokeless tobacco: Never Used  Substance Use Topics  . Alcohol use: Yes    Comment: occasional wine   Review of Systems  Objective:   Vitals:   07/11/17 1422  BP: 124/74  Pulse: 85  Resp: 14  Temp: 99 F (37.2 C)  TempSrc: Oral  SpO2: 95%  Weight: 245 lb 12.8 oz (111.5 kg)  Height: '5\' 4"'  (1.626 m)   Body mass index is 42.19 kg/m. Wt Readings from Last 3 Encounters:  09/07/17 240 lb (108.9 kg)  09/07/17 237 lb 14.4 oz (107.9 kg)  08/22/17 241 lb 12.8 oz (109.7 kg)   Physical Exam  Constitutional: She appears well-developed and well-nourished.  HENT:  Head: Normocephalic and atraumatic.  Right Ear: Hearing, tympanic membrane, external ear and ear canal normal.  Left Ear: Hearing, tympanic membrane, external ear and ear canal normal.  Eyes: Conjunctivae and EOM are normal. Right eye exhibits no hordeolum. Left eye exhibits no hordeolum. No scleral icterus.  Neck: Carotid bruit is not present. No thyromegaly present.  Cardiovascular: Normal rate, regular  rhythm, S1 normal, S2 normal and normal heart sounds.  No extrasystoles are present.  Pulmonary/Chest: Effort normal and breath sounds normal. No respiratory distress. Right breast exhibits no inverted nipple, no mass, no nipple discharge, no skin change and no tenderness. Left breast exhibits no inverted nipple, no mass, no nipple discharge, no skin change and no tenderness. Breasts are symmetrical.  Abdominal: Soft. Normal appearance and bowel sounds are normal. She exhibits no distension, no abdominal bruit, no pulsatile midline mass and no mass. There is no hepatosplenomegaly. There is no tenderness. No hernia.  Musculoskeletal: Normal range of motion. She exhibits no edema.  Lymphadenopathy:       Head (right side): No submandibular adenopathy present.       Head (left side): No submandibular adenopathy present.    She has no cervical adenopathy.    She has no axillary adenopathy.  Neurological: She is alert. She displays no tremor. No cranial nerve deficit. She exhibits normal muscle tone. Gait normal.  Reflex Scores:      Patellar reflexes are 2+ on the right side and 2+ on the left side. Skin: Skin is warm and dry. No bruising and no ecchymosis noted. No cyanosis. No pallor.  Psychiatric: Her speech is normal and behavior is normal. Thought content normal. Her mood appears not anxious. She does not exhibit a depressed mood.    Assessment/Plan:   Problem List Items Addressed This Visit      Cardiovascular and Mediastinum   Chronic systolic CHF (congestive heart failure) (HCC)    Seeing Dr. Tamala Julian for this; will start cardiopulm rehab soon      HTN (hypertension)    Well-controlled; continue current medicine        Other   Morbid obesity ([place])    Talked with patient about her weight struggles; encouragement given; discussed options, including Saxenda; note written to cardiologist to see if he would agree to starting this; no MEN-2, no MTC; will start if okay      RESOLVED:  Preventative health  care - Primary    USPSTF grade A and B recommendations reviewed with patient; age-appropriate recommendations, preventive care, screening tests, etc discussed and encouraged; healthy living encouraged; see AVS for patient education given to patient       Relevant Orders   CBC with Differential/Platelet (Completed)   COMPLETE METABOLIC PANEL WITH GFR (Completed)   Lipid panel (Completed)   TSH (Completed)       Orders Placed This Encounter  Procedures  . CBC with Differential/Platelet  . COMPLETE METABOLIC PANEL WITH GFR  . Lipid panel  . TSH    Follow up plan: Return in about 1 year (around 07/11/2018) for complete physical.  An After Visit Summary was printed and given to the patient.  Addendum: I10 added to a/p

## 2017-07-12 DIAGNOSIS — Z6841 Body Mass Index (BMI) 40.0 and over, adult: Secondary | ICD-10-CM | POA: Insufficient documentation

## 2017-07-12 DIAGNOSIS — Z6839 Body mass index (BMI) 39.0-39.9, adult: Secondary | ICD-10-CM | POA: Insufficient documentation

## 2017-07-12 LAB — COMPLETE METABOLIC PANEL WITH GFR
AG RATIO: 1.3 (calc) (ref 1.0–2.5)
ALT: 9 U/L (ref 6–29)
AST: 17 U/L (ref 10–35)
Albumin: 4 g/dL (ref 3.6–5.1)
Alkaline phosphatase (APISO): 69 U/L (ref 33–130)
BUN: 9 mg/dL (ref 7–25)
CALCIUM: 9 mg/dL (ref 8.6–10.4)
CO2: 29 mmol/L (ref 20–32)
Chloride: 102 mmol/L (ref 98–110)
Creat: 0.88 mg/dL (ref 0.50–0.99)
GFR, EST AFRICAN AMERICAN: 82 mL/min/{1.73_m2} (ref >=60)
GFR, EST NON AFRICAN AMERICAN: 70 mL/min/{1.73_m2} (ref >=60)
Globulin: 3 g/dL (calc) (ref 1.9–3.7)
Glucose, Bld: 90 mg/dL (ref 65–99)
POTASSIUM: 3.7 mmol/L (ref 3.5–5.3)
Sodium: 140 mmol/L (ref 135–146)
TOTAL PROTEIN: 7 g/dL (ref 6.1–8.1)
Total Bilirubin: 0.5 mg/dL (ref 0.2–1.2)

## 2017-07-12 LAB — CBC WITH DIFFERENTIAL/PLATELET
BASOS ABS: 33 {cells}/uL (ref 0–200)
Basophils Relative: 0.5 %
EOS PCT: 3.2 %
Eosinophils Absolute: 208 cells/uL (ref 15–500)
HEMATOCRIT: 34 % — AB (ref 35.0–45.0)
HEMOGLOBIN: 11.5 g/dL — AB (ref 11.7–15.5)
LYMPHS ABS: 2035 {cells}/uL (ref 850–3900)
MCH: 28.3 pg (ref 27.0–33.0)
MCHC: 33.8 g/dL (ref 32.0–36.0)
MCV: 83.5 fL (ref 80.0–100.0)
MPV: 10.8 fL (ref 7.5–12.5)
Monocytes Relative: 6 %
NEUTROS ABS: 3835 {cells}/uL (ref 1500–7800)
Neutrophils Relative %: 59 %
Platelets: 186 10*3/uL (ref 140–400)
RBC: 4.07 10*6/uL (ref 3.80–5.10)
RDW: 13 % (ref 11.0–15.0)
Total Lymphocyte: 31.3 %
WBC: 6.5 10*3/uL (ref 3.8–10.8)
WBCMIX: 390 {cells}/uL (ref 200–950)

## 2017-07-12 LAB — LIPID PANEL
Cholesterol: 172 mg/dL (ref <200)
HDL: 40 mg/dL — ABNORMAL LOW (ref >50)
LDL CHOLESTEROL (CALC): 104 mg/dL — AB
NON-HDL CHOLESTEROL (CALC): 132 mg/dL — AB (ref <130)
TRIGLYCERIDES: 161 mg/dL — AB (ref <150)
Total CHOL/HDL Ratio: 4.3 (calc) (ref <5.0)

## 2017-07-12 LAB — TSH: TSH: 1.41 mIU/L (ref 0.40–4.50)

## 2017-07-12 NOTE — Assessment & Plan Note (Signed)
Seeing Dr. Tamala Julian for this; will start cardiopulm rehab soon

## 2017-07-12 NOTE — Telephone Encounter (Signed)
Left detailed voicemail and that savings card would be left up front for her to pick up at our office.

## 2017-07-12 NOTE — Assessment & Plan Note (Signed)
USPSTF grade A and B recommendations reviewed with patient; age-appropriate recommendations, preventive care, screening tests, etc discussed and encouraged; healthy living encouraged; see AVS for patient education given to patient  

## 2017-07-12 NOTE — Assessment & Plan Note (Signed)
Talked with patient about her weight struggles; encouragement given; discussed options, including Saxenda; note written to cardiologist to see if he would agree to starting this; no MEN-2, no MTC; will start if okay

## 2017-07-18 ENCOUNTER — Ambulatory Visit
Admission: RE | Admit: 2017-07-18 | Discharge: 2017-07-18 | Disposition: A | Discharge status: Home / Self Care | Payer: Medicare Other | Source: Ambulatory Visit | Attending: Family Medicine | Admitting: Family Medicine

## 2017-07-18 DIAGNOSIS — Z1231 Encounter for screening mammogram for malignant neoplasm of breast: Secondary | ICD-10-CM | POA: Diagnosis not present

## 2017-07-31 ENCOUNTER — Other Ambulatory Visit: Payer: Self-pay | Admitting: Family Medicine

## 2017-08-01 ENCOUNTER — Telehealth: Payer: Self-pay | Admitting: Nurse Practitioner

## 2017-08-01 ENCOUNTER — Encounter: Payer: Self-pay | Admitting: Nurse Practitioner

## 2017-08-01 NOTE — Telephone Encounter (Signed)
Called patient and gave her the date, time and location of her cardiac MRI.  Letter mailed to nurse and the patient.

## 2017-08-04 ENCOUNTER — Other Ambulatory Visit: Payer: Self-pay

## 2017-08-04 NOTE — Telephone Encounter (Signed)
Ins requesting 90

## 2017-08-07 ENCOUNTER — Other Ambulatory Visit (HOSPITAL_COMMUNITY): Payer: Medicare Other

## 2017-08-08 ENCOUNTER — Other Ambulatory Visit: Payer: Self-pay

## 2017-08-08 DIAGNOSIS — M25561 Pain in right knee: Secondary | ICD-10-CM

## 2017-08-08 NOTE — Telephone Encounter (Signed)
Received request from [facility] for 90 day supple pt last seen 07/11/2017 for physical. Initial rx given 06/26/17 for acute knee pain. Please advise.

## 2017-08-08 NOTE — Telephone Encounter (Signed)
I don't really mean for her to be on this forever Please refer her to physical therapy Document her complaints, why she thinks she needs the tizanidine and then put in referral Thank you

## 2017-08-10 ENCOUNTER — Other Ambulatory Visit: Payer: Self-pay

## 2017-08-10 ENCOUNTER — Ambulatory Visit (INDEPENDENT_AMBULATORY_CARE_PROVIDER_SITE_OTHER): Payer: Medicare Other | Admitting: Family Medicine

## 2017-08-10 ENCOUNTER — Emergency Department: Payer: Medicare Other

## 2017-08-10 ENCOUNTER — Encounter: Payer: Self-pay | Admitting: Family Medicine

## 2017-08-10 ENCOUNTER — Emergency Department
Admission: EM | Admit: 2017-08-10 | Discharge: 2017-08-11 | Disposition: A | Discharge status: Home / Self Care | Payer: Medicare Other | Attending: Emergency Medicine | Admitting: Emergency Medicine

## 2017-08-10 VITALS — BP 142/90 | HR 86 | Temp 98.9°F | Resp 16 | Wt 240.2 lb

## 2017-08-10 DIAGNOSIS — R195 Other fecal abnormalities: Secondary | ICD-10-CM

## 2017-08-10 DIAGNOSIS — M545 Low back pain, unspecified: Secondary | ICD-10-CM

## 2017-08-10 DIAGNOSIS — J45909 Unspecified asthma, uncomplicated: Secondary | ICD-10-CM | POA: Diagnosis not present

## 2017-08-10 DIAGNOSIS — R310 Gross hematuria: Secondary | ICD-10-CM | POA: Diagnosis not present

## 2017-08-10 DIAGNOSIS — I110 Hypertensive heart disease with heart failure: Secondary | ICD-10-CM | POA: Diagnosis not present

## 2017-08-10 DIAGNOSIS — Z79899 Other long term (current) drug therapy: Secondary | ICD-10-CM | POA: Diagnosis not present

## 2017-08-10 DIAGNOSIS — N390 Urinary tract infection, site not specified: Secondary | ICD-10-CM | POA: Diagnosis not present

## 2017-08-10 DIAGNOSIS — R319 Hematuria, unspecified: Secondary | ICD-10-CM | POA: Diagnosis not present

## 2017-08-10 DIAGNOSIS — R109 Unspecified abdominal pain: Secondary | ICD-10-CM | POA: Diagnosis not present

## 2017-08-10 DIAGNOSIS — R350 Frequency of micturition: Secondary | ICD-10-CM

## 2017-08-10 DIAGNOSIS — R1084 Generalized abdominal pain: Secondary | ICD-10-CM

## 2017-08-10 DIAGNOSIS — I5022 Chronic systolic (congestive) heart failure: Secondary | ICD-10-CM | POA: Diagnosis not present

## 2017-08-10 DIAGNOSIS — R32 Unspecified urinary incontinence: Secondary | ICD-10-CM | POA: Diagnosis not present

## 2017-08-10 DIAGNOSIS — Z7901 Long term (current) use of anticoagulants: Secondary | ICD-10-CM | POA: Insufficient documentation

## 2017-08-10 DIAGNOSIS — S3992XA Unspecified injury of lower back, initial encounter: Secondary | ICD-10-CM | POA: Diagnosis not present

## 2017-08-10 DIAGNOSIS — Z86711 Personal history of pulmonary embolism: Secondary | ICD-10-CM | POA: Insufficient documentation

## 2017-08-10 DIAGNOSIS — R1030 Lower abdominal pain, unspecified: Secondary | ICD-10-CM | POA: Diagnosis not present

## 2017-08-10 DIAGNOSIS — R197 Diarrhea, unspecified: Secondary | ICD-10-CM | POA: Diagnosis not present

## 2017-08-10 LAB — URINALYSIS, MICROSCOPIC (REFLEX): Bacteria, UA: NONE SEEN

## 2017-08-10 LAB — COMPREHENSIVE METABOLIC PANEL
ALT: 14 U/L (ref 14–54)
AST: 21 U/L (ref 15–41)
Albumin: 4 g/dL (ref 3.5–5.0)
Alkaline Phosphatase: 84 U/L (ref 38–126)
Anion gap: 10 (ref 5–15)
BUN: 11 mg/dL (ref 6–20)
CHLORIDE: 102 mmol/L (ref 101–111)
CO2: 27 mmol/L (ref 22–32)
CREATININE: 0.76 mg/dL (ref 0.44–1.00)
Calcium: 9.4 mg/dL (ref 8.9–10.3)
GFR calc Af Amer: >60 mL/min (ref >60)
GFR calc non Af Amer: >60 mL/min (ref >60)
Glucose, Bld: 103 mg/dL — ABNORMAL HIGH (ref 65–99)
Potassium: 3.9 mmol/L (ref 3.5–5.1)
SODIUM: 139 mmol/L (ref 135–145)
Total Bilirubin: 0.8 mg/dL (ref 0.3–1.2)
Total Protein: 7.9 g/dL (ref 6.5–8.1)

## 2017-08-10 LAB — URINALYSIS, ROUTINE W REFLEX MICROSCOPIC
BILIRUBIN URINE: NEGATIVE
Glucose, UA: NEGATIVE mg/dL
KETONES UR: NEGATIVE mg/dL
NITRITE: NEGATIVE
PROTEIN: 100 mg/dL — AB
Specific Gravity, Urine: 1.011 (ref 1.005–1.030)
pH: 6 (ref 5.0–8.0)

## 2017-08-10 LAB — POCT URINALYSIS DIPSTICK
Bilirubin, UA: NEGATIVE
Blood, UA: POSITIVE
Glucose, UA: NEGATIVE
Ketones, UA: NEGATIVE
Nitrite, UA: NEGATIVE
PH UA: 6.5 (ref 5.0–8.0)
PROTEIN UA: NEGATIVE
SPEC GRAV UA: 1.015 (ref 1.010–1.025)
UROBILINOGEN UA: 0.2 U/dL

## 2017-08-10 LAB — CBC
HCT: 40.3 % (ref 35.0–47.0)
Hemoglobin: 13.4 g/dL (ref 12.0–16.0)
MCH: 28.2 pg (ref 26.0–34.0)
MCHC: 33.2 g/dL (ref 32.0–36.0)
MCV: 84.9 fL (ref 80.0–100.0)
PLATELETS: 195 10*3/uL (ref 150–440)
RBC: 4.74 MIL/uL (ref 3.80–5.20)
RDW: 13.8 % (ref 11.5–14.5)
WBC: 8.3 10*3/uL (ref 3.6–11.0)

## 2017-08-10 LAB — HEMOCCULT GUIAC POC 1CARD (OFFICE)
Card #1 Date: 12062018
Fecal Occult Blood, POC: POSITIVE — AB

## 2017-08-10 MED ORDER — IOPAMIDOL (ISOVUE-300) INJECTION 61%
100.0000 mL | Freq: Once | INTRAVENOUS | Status: AC | PRN
  Administered 2017-08-10: 100 mL via INTRAVENOUS

## 2017-08-10 MED ORDER — CIPROFLOXACIN HCL 500 MG PO TABS
500.0000 mg | ORAL_TABLET | Freq: Once | ORAL | Status: AC
  Administered 2017-08-10: 500 mg via ORAL
  Filled 2017-08-10: qty 1

## 2017-08-10 MED ORDER — PHENAZOPYRIDINE HCL 200 MG PO TABS
200.0000 mg | ORAL_TABLET | Freq: Once | ORAL | Status: AC
  Administered 2017-08-10: 200 mg via ORAL
  Filled 2017-08-10: qty 1

## 2017-08-10 MED ORDER — MORPHINE SULFATE (PF) 4 MG/ML IV SOLN
INTRAVENOUS | Status: AC
  Administered 2017-08-10: 4 mg via INTRAVENOUS
  Filled 2017-08-10: qty 1

## 2017-08-10 MED ORDER — LORAZEPAM 2 MG/ML IJ SOLN
1.0000 mg | Freq: Once | INTRAMUSCULAR | Status: AC
  Administered 2017-08-10: 1 mg via INTRAVENOUS
  Filled 2017-08-10: qty 1

## 2017-08-10 MED ORDER — MORPHINE SULFATE (PF) 4 MG/ML IV SOLN
4.0000 mg | Freq: Once | INTRAVENOUS | Status: AC
  Administered 2017-08-10: 4 mg via INTRAVENOUS

## 2017-08-10 MED ORDER — PHENAZOPYRIDINE HCL 200 MG PO TABS: 200.0000 mg | ORAL_TABLET | Freq: Three times a day (TID) | ORAL | 0 refills | Status: DC | PRN

## 2017-08-10 MED ORDER — IOPAMIDOL (ISOVUE-300) INJECTION 61%
30.0000 mL | Freq: Once | INTRAVENOUS | Status: AC | PRN
  Administered 2017-08-10: 30 mL via ORAL

## 2017-08-10 MED ORDER — CIPROFLOXACIN HCL 500 MG PO TABS: 500.0000 mg | ORAL_TABLET | Freq: Two times a day (BID) | ORAL | 0 refills | Status: AC

## 2017-08-10 NOTE — ED Triage Notes (Signed)
Pt states she fell backwards last [place] the concrete at [place] and has been having lower abd pain since, today has loss of bladder control and was checked by PCP today and has loss of rectal tone.

## 2017-08-10 NOTE — Progress Notes (Addendum)
BP (!) 142/90   Pulse 86   Temp 98.9 F (37.2 C) (Oral)   Resp 16   Wt 240 lb 3.2 oz (109 kg)   SpO2 93%   BMI 41.23 kg/m    Subjective:    Patient ID: Patricia Fischer, female    DOB: 1956-08-24, 61 y.o.   MRN: [id number]  HPI: Patricia Fischer is a 61 y.o. female  Chief Complaint  Patient presents with  . Urinary Frequency    unable to hold urine  . Fall    fell on concrete hit right side of back  . Abdominal Pain    lower    HPI Patient is here for an acute visit She fell [place] the steps at church one week ago Since the fall, urine is pouring out every time she moves Not able to control her bladder Urine is just running down her legs, that just started today, just pouring out She fell on concrete and hit the right side of her back She also has lower abdominal pain Feels like she is going to start her period down there, hasn't had a period in 30 years [place] hard, when I asked how significant Has had diarrhea for a few days, but not messing on herself, got to the bathroom; does not have a gallbladder Pain is 10 out of 10 Pain escalated down below just started this morning around 5 am She is supposed to have an MRI tomorrow No back, pelvic, or hip xrays  Depression screen [facility] 2/9 07/11/2017 06/26/2017 08/04/2016 07/27/2016 03/22/2016  Decreased Interest 0 0 0 0 0  Down, Depressed, Hopeless 0 0 0 0 0  PHQ - 2 Score 0 0 0 0 0  Altered sleeping - - - - -  Tired, decreased energy - - - - -  Change in appetite - - - - -  Feeling bad or failure about yourself  - - - - -  Trouble concentrating - - - - -  Moving slowly or fidgety/restless - - - - -  Suicidal thoughts - - - - -  PHQ-9 Score - - - - -    Relevant past medical, surgical, family and social history reviewed Past Medical History:  Diagnosis Date  . Anxiety   . Asthma   . Blood transfusion [place]  . Breast cancer ([place]) 04/26/2008   left breast  . Breast cancer, left breast ([place]) 2011   DCIS  . Chronic migraine without aura, with intractable migraine, so stated, with status migrainosus March 2016  . Chronic systolic CHF (congestive heart failure) ([place]) 05/05/2015   a. Dx 04/2015 - EF initially 25-30% with RV dysfunction, f/u echo 08/2015 technically difficult, EF 35-40%, anterior, anteroseptal, apical and inferoapical severe hypokinesis suggestive of LAD territory ischemia/infarct, grade 1 DD, mild MR, RV normal.  . Depression   . DVT (deep venous thrombosis) ([place]) Aug. 2016   a. Dx 04/2015 - patient returned 05/2015 after noncompliance with Xarelto.  . Environmental allergies   . Essential hypertension   . Family history of adverse reaction to anesthesia    " My Mother would get deathly sick "  . GERD (gastroesophageal reflux disease)   . Head injury 2011  . Hemorrhoids   . LBBB (left bundle branch block)   . Obesity   . Orthostatic hypotension   . Pneumonia 2012  . Saddle pulmonary embolus ([place]) Aug. 2016   a. Dx 04/2015 - patient returned 05/2015 after noncompliance  with Xarelto.  . Shortness of breath dyspnea   . Syncope    a. Recurrent syncope in 2016 felt 2/2 orthostasis in setting of PE.   Past Surgical History:  Procedure Laterality Date  . ABDOMINAL HYSTERECTOMY  1986  . ANAL FISSURE REPAIR    . ANTERIOR CERVICAL DECOMP/DISCECTOMY FUSION  01/23/2012   Procedure: ANTERIOR CERVICAL DECOMPRESSION/DISCECTOMY FUSION 1 LEVEL;  Surgeon: Elaina Hoops, MD;  Location: [place] [facility];  Service: Neurosurgery;  Laterality: N/A;  Cervical five-six anterior cervical discectomy fusion with plating  . APPENDECTOMY    . BREAST BIOPSY Left 06/2016   US biopsy  . BREAST SURGERY Left 2011   Breast lumpectomy  . CARDIAC CATHETERIZATION  2010   [facility]; pt states it was "clear"  . CARDIAC CATHETERIZATION N/A 10/28/2015   Procedure: Right/Left Heart Cath and Coronary Angiography;  Surgeon: Belva Crome, MD;  Location: [place] CV LAB;  Service: Cardiovascular;  Laterality:  N/A;  . CARPAL TUNNEL RELEASE Bilateral   . CESAREAN SECTION  1982, 1979  . CHOLECYSTECTOMY    . COLONOSCOPY  01/16/2009   Dr Bary Castilla  . KNEE CARTILAGE SURGERY Left   . SHOULDER ARTHROSCOPY WITH SUBACROMIAL DECOMPRESSION, ROTATOR CUFF REPAIR AND BICEP TENDON REPAIR Right 11/22/2013   Procedure: RIGHT SHOULDER ATHROSCOPY OPEN SUBSCAP REPAIR DELTA-PECTORAL ;  Surgeon: Augustin Schooling, MD;  Location: [place];  Service: Orthopedics;  Laterality: Right;  . TARSAL TUNNEL RELEASE Left 1990  . TONSILLECTOMY    . TUBAL LIGATION     Family History  Problem Relation Age of Onset  . Breast cancer Paternal Aunt   . Breast cancer Mother 76  . Migraines Mother   . Cancer Mother   . Hypertension Mother   . Rectal cancer Father   . Cancer Father   . COPD Father   . Heart disease Paternal Grandmother   . Stroke Paternal Grandmother   . Heart attack Paternal Grandmother   . Aneurysm Paternal Grandfather   . COPD Paternal Grandfather   . Breast cancer Cousin   . Breast cancer Cousin   . Breast cancer Cousin   . Anesthesia problems Neg Hx   . Diabetes Neg Hx    Social History   Tobacco Use  . Smoking status: Never Smoker  . Smokeless tobacco: Never Used  Substance Use Topics  . Alcohol use: Yes    Comment: occasional wine  . Drug use: No    Interim medical history since last visit reviewed. Allergies and medications reviewed  Review of Systems Per HPI unless specifically indicated above     Objective:    BP (!) 142/90   Pulse 86   Temp 98.9 F (37.2 C) (Oral)   Resp 16   Wt 240 lb 3.2 oz (109 kg)   SpO2 93%   BMI 41.23 kg/m   Wt Readings from Last 3 Encounters:  08/10/17 240 lb 3.2 oz (109 kg)  07/11/17 245 lb 12.8 oz (111.5 kg)  07/03/17 241 lb (109.3 kg)    Physical Exam  Constitutional: She appears well-developed and well-nourished. She appears distressed (in obvious pain, tearful).  Eyes: No scleral icterus.  Cardiovascular: Normal rate and regular rhythm.    Pulmonary/Chest: Effort normal and breath sounds normal.  Abdominal: Soft. Bowel sounds are normal. She exhibits no distension. There is tenderness. There is guarding.    Genitourinary: Rectal exam shows guaiac positive stool.  Musculoskeletal:       Lumbar back: She exhibits tenderness. She  exhibits no swelling and no edema.       Back:  No visible bruising  Neurological: She is alert.  DRE performed; no visible blood; diminished rectal sphincter and patient tried to compensate with buttocks  Skin: No bruising and no ecchymosis noted.  Psychiatric: Her mood appears anxious.  Tearful    Results for orders placed or performed in visit on 08/10/17  POCT urinalysis dipstick  Result Value Ref Range   Color, UA slight red    Clarity, UA cloudy    Glucose, UA neg    Bilirubin, UA neg    Ketones, UA neg    Spec Grav, UA 1.015 1.010 - 1.025   Blood, UA positive    pH, UA 6.5 5.0 - 8.0   Protein, UA neg    Urobilinogen, UA 0.2 0.2 or 1.0 E.U./dL   Nitrite, UA neg    Leukocytes, UA Large (3+) (A) Negative      Assessment & Plan:   Problem List Items Addressed This Visit      Other   Hematuria   Relevant Orders   Urine Culture    Other Visit Diagnoses    Urinary frequency    -  Primary   Relevant Orders   POCT urinalysis dipstick (Completed)   Urine Culture   Acute right-sided low back pain, with sciatica presence unspecified       after a fall with gross hematuria, and now loss of bladder control; sending to ER now for evaluation; report given to [facility]   Relevant Orders   POCT urinalysis dipstick (Completed)   Urine Culture   Acute right-sided low back pain without sciatica       Urinary incontinence, unspecified type           Follow up plan: No Follow-up on file.  An after-visit summary was printed and given to the patient at [facility].  Please see the patient instructions which may contain other information and recommendations beyond what is mentioned above  in the assessment and plan.  No orders of the defined types were placed in this encounter.   Orders Placed This Encounter  Procedures  . Urine Culture  . POCT urinalysis dipstick

## 2017-08-10 NOTE — Patient Instructions (Signed)
Go straight to the ER. °

## 2017-08-10 NOTE — ED Provider Notes (Signed)
[facility] Emergency Department Provider Note   ____________________________________________   I have reviewed the triage vital signs and the nursing notes.   HISTORY  Chief Complaint Lower abdominal pain  History limited by: Not Limited   HPI Patricia Fischer is a 61 y.o. female who presents to the emergency department today sent by primary care because of concern for bladder incontinence and poor rectal tone after a fall. Patient has primary complaint of lower abdominal pain.   LOCATION:suprapubic region DURATION:1 week TIMING: waxing and waning, initially severe after the fall, then improved, now has gotten worse again SEVERITY: severe QUALITY: feels like organs are trying to come out CONTEXT: patient states that she fell at church roughly 1 week ago. Fell on her bottom. Had some pain in the lower abdomin shortly after. It did improve for the next couple of days until becoming severe today. This morning the patient was incontinent of urine.  MODIFYING FACTORS: worse with movement ASSOCIATED SYMPTOMS: patient denies any fevers. No nausea or vomiting. No bad odor to her urine.  Per medical record review patient saw primary care physician today.  Past Medical History:  Diagnosis Date  . Anxiety   . Asthma   . Blood transfusion [place]  . Breast cancer ([place]) 04/26/2008   left breast  . Breast cancer, left breast ([place]) 2011   DCIS  . Chronic migraine without aura, with intractable migraine, so stated, with status migrainosus March 2016  . Chronic systolic CHF (congestive heart failure) ([place]) 05/05/2015   a. Dx 04/2015 - EF initially 25-30% with RV dysfunction, f/u echo 08/2015 technically difficult, EF 35-40%, anterior, anteroseptal, apical and inferoapical severe hypokinesis suggestive of LAD territory ischemia/infarct, grade 1 DD, mild MR, RV normal.  . Depression   . DVT (deep venous thrombosis) ([place]) Aug. 2016   a. Dx 04/2015 - patient  returned 05/2015 after noncompliance with Xarelto.  . Environmental allergies   . Essential hypertension   . Family history of adverse reaction to anesthesia    " My Mother would get deathly sick "  . GERD (gastroesophageal reflux disease)   . Head injury 2011  . Hemorrhoids   . LBBB (left bundle branch block)   . Obesity   . Orthostatic hypotension   . Pneumonia 2012  . Saddle pulmonary embolus ([place]) Aug. 2016   a. Dx 04/2015 - patient returned 05/2015 after noncompliance with Xarelto.  . Shortness of breath dyspnea   . Syncope    a. Recurrent syncope in 2016 felt 2/2 orthostasis in setting of PE.    Patient Active Problem List   Diagnosis Date Noted  . Morbid obesity ([place]) 07/12/2017  . Allergic rhinitis 06/26/2017  . Insomnia 06/26/2017  . Hematuria 07/27/2016  . Medication monitoring encounter 06/08/2016  . Preventative health care 05/17/2016  . Cervical cancer screening 05/17/2016  . Degenerative joint disease (DJD) of hip 03/31/2016  . Hx of breast cancer 03/22/2016  . GERD (gastroesophageal reflux disease) 10/07/2015  . HTN (hypertension) 10/07/2015  . Orthostatic hypotension 10/07/2015  . Controlled substance agreement signed 08/08/2015  . Depression, major, recurrent, moderate ([place]) 06/11/2015  . Anemia 05/15/2015  . Do not resuscitate status 05/15/2015  . Chronic systolic CHF (congestive heart failure) ([place]) 05/05/2015  . Left leg DVT ([place]) 05/05/2015  . Pulmonary hypertension ([place])   . Saddle pulmonary embolus ([place])   . LBBB (left bundle branch block)-rate related  04/19/2015  . Chronic migraine without aura, with intractable migraine, so  stated, with status migrainosus 11/04/2014  . Headache 04/29/2014  . Subscapularis (muscle) sprain 11/22/2013    Past Surgical History:  Procedure Laterality Date  . ABDOMINAL HYSTERECTOMY  1986  . ANAL FISSURE REPAIR    . ANTERIOR CERVICAL DECOMP/DISCECTOMY FUSION  01/23/2012   Procedure: ANTERIOR CERVICAL  DECOMPRESSION/DISCECTOMY FUSION 1 LEVEL;  Surgeon: Elaina Hoops, MD;  Location: [facility];  Service: Neurosurgery;  Laterality: N/A;  Cervical five-six anterior cervical discectomy fusion with plating  . APPENDECTOMY    . BREAST BIOPSY Left 06/2016   US biopsy  . BREAST SURGERY Left 2011   Breast lumpectomy  . CARDIAC CATHETERIZATION  2010   [facility]; pt states it was "clear"  . CARDIAC CATHETERIZATION N/A 10/28/2015   Procedure: Right/Left Heart Cath and Coronary Angiography;  Surgeon: Belva Crome, MD;  Location: [facility];  Service: Cardiovascular;  Laterality: N/A;  . CARPAL TUNNEL RELEASE Bilateral   . CESAREAN SECTION  1982, 1979  . CHOLECYSTECTOMY    . COLONOSCOPY  01/16/2009   Dr Bary Castilla  . KNEE CARTILAGE SURGERY Left   . SHOULDER ARTHROSCOPY WITH SUBACROMIAL DECOMPRESSION, ROTATOR CUFF REPAIR AND BICEP TENDON REPAIR Right 11/22/2013   Procedure: RIGHT SHOULDER ATHROSCOPY OPEN SUBSCAP REPAIR DELTA-PECTORAL ;  Surgeon: Augustin Schooling, MD;  Location: [facility];  Service: Orthopedics;  Laterality: Right;  . TARSAL TUNNEL RELEASE Left 1990  . TONSILLECTOMY    . TUBAL LIGATION      Prior to Admission medications   Medication Sig Start Date End Date Taking? Authorizing Provider  albuterol (PROAIR HFA) 108 (90 Base) MCG/ACT inhaler Inhale 2 puffs into the lungs every 4 (four) hours as needed for wheezing or shortness of breath. 06/27/17   Arnetha Courser, MD  amitriptyline (ELAVIL) 25 MG tablet Take 3 tablets (75 mg total) by mouth at bedtime. 12/10/15   Kathrynn Ducking, MD  carvedilol (COREG) 6.25 MG tablet Take 1 tablet (6.25 mg total) by mouth 2 (two) times daily with a meal. 07/03/17   Burtis Junes, NP  fluticasone (FLONASE) 50 MCG/ACT nasal spray Place 1 spray into both nostrils daily as needed for allergies or rhinitis. 06/26/17   Arnetha Courser, MD  Insulin Pen Needle (PEN NEEDLES) 31G X 6 MM MISC For use with Saxenda pen; inject medicine daily subcutaneously  07/11/17   Lada, Satira Anis, MD  Liraglutide -Weight Management (SAXENDA) 18 MG/3ML SOPN Inject 0.6 mg daily into the skin. x 1 week, then 1.2 mg daily x 1 week, then 1.8 mg daily x 1 week, then 2.4 mg daily x 1 week, then 3 mg daily Patient not taking: Reported on 08/10/2017 07/11/17   Arnetha Courser, MD  losartan (COZAAR) 25 MG tablet Take 1 tablet (25 mg total) by mouth daily. 03/06/17 06/04/17  Belva Crome, MD  losartan (COZAAR) 25 MG tablet Take 25 mg by mouth daily. 07/31/17   [provider]  rivaroxaban (XARELTO) 20 MG TABS tablet TAKE 1 TABLET BY MOUTH DAILY WITH SUPPER. 07/03/17   Burtis Junes, NP  spironolactone (ALDACTONE) 25 MG tablet TAKE 1 TABLET BY MOUTH EVERY MONDAY, WEDNESDAY, AND FRIDAY. 03/27/17   Belva Crome, MD  tiZANidine (ZANAFLEX) 2 MG tablet TAKE 1 TO 2 TABLETS BY MOUTH EVERY EIGHT HOURS AS NEEDED FOR MUSCLE SPASMS 07/31/17   Lada, Satira Anis, MD  topiramate (TOPAMAX) 100 MG tablet Take 100 mg by mouth at bedtime as needed (headaches).    [provider]  Allergies Gadolinium derivatives; Sulfa antibiotics; Aripiprazole; Codeine; Oxycodone; and Penicillins  Family History  Problem Relation Age of Onset  . Breast cancer Paternal Aunt   . Breast cancer Mother 11  . Migraines Mother   . Cancer Mother   . Hypertension Mother   . Rectal cancer Father   . Cancer Father   . COPD Father   . Heart disease Paternal Grandmother   . Stroke Paternal Grandmother   . Heart attack Paternal Grandmother   . Aneurysm Paternal Grandfather   . COPD Paternal Grandfather   . Breast cancer Cousin   . Breast cancer Cousin   . Breast cancer Cousin   . Anesthesia problems Neg Hx   . Diabetes Neg Hx     Social History Social History   Tobacco Use  . Smoking status: Never Smoker  . Smokeless tobacco: Never Used  Substance Use Topics  . Alcohol use: Yes    Comment: occasional wine  . Drug use: No    Review of Systems Constitutional: No  fever/chills Eyes: No visual changes. ENT: No sore throat. Cardiovascular: Denies chest pain. Respiratory: Denies shortness of breath. Gastrointestinal: Positive for lower abdominal pain. Genitourinary: Positive for incontinence.  Musculoskeletal: Positive for low back pain. Skin: Negative for rash. Neurological: Negative for headaches, focal weakness or numbness.  ____________________________________________   PHYSICAL EXAM:  VITAL SIGNS: ED Triage Vitals  Enc Vitals Group     BP 08/10/17 1517 (!) 147/105     Pulse Rate 08/10/17 1515 94     Resp 08/10/17 1515 17     Temp 08/10/17 1515 98 F (36.7 C)     Temp Source 08/10/17 1515 Oral     SpO2 08/10/17 1515 97 %     Weight 08/10/17 1515 240 lb (108.9 kg)     Height 08/10/17 1515 5\' 5"  (1.651 m)     Head Circumference --      Peak Flow --      Pain Score 08/10/17 1515 10   Constitutional: Alert and oriented. Eyes: Conjunctivae are normal.  ENT   Head: Normocephalic and atraumatic.   Nose: No congestion/rhinnorhea.   Mouth/Throat: Mucous membranes are moist.   Neck: No stridor. Hematological/Lymphatic/Immunilogical: No cervical lymphadenopathy. Cardiovascular: Normal rate, regular rhythm.  No murmurs, rubs, or gallops. Respiratory: Normal respiratory effort without tachypnea nor retractions. Breath sounds are clear and equal bilaterally. No wheezes/rales/rhonchi. Gastrointestinal: Soft and tender in the lower abdomen.  Genitourinary: Deferred Musculoskeletal: Normal range of motion in all extremities. No lower extremity edema. Neurologic:  Normal speech and language. No gross focal neurologic deficits are appreciated.  Skin:  Skin is warm, dry and intact. No rash noted. Psychiatric: Mood and affect are normal. Speech and behavior are normal. Patient exhibits appropriate insight and judgment.  ____________________________________________    LABS (pertinent positives/negatives)  CBC wnl CMP wnl except  glu 103 UA with large leukocytes, too numerous to count RBC and WBC  ____________________________________________   EKG  None  ____________________________________________    RADIOLOGY  MRI lumbar Degenerative changes. Neural foraminal narrowing.   CT abd/pel Consistent with cystitis. Diarrheal disease. ____________________________________________   PROCEDURES  Procedures  ____________________________________________   INITIAL IMPRESSION / ASSESSMENT AND PLAN / ED COURSE  Pertinent labs & imaging results that were available during my care of the patient were reviewed by me and considered in my medical decision making (see chart for details).  Patient presented to emergency department sent from primary care for symptoms concerning for cuada equina. MRI lumbar  spine negative for spinal cord lesion, does show some neural foraminal narrowing. UA was consistent with infection. CT scan was ordered to evaluate for other concerning etiologies of abdominal pain including abscess, diverticulitis amongst other etiologies. It did show findings consistent with cystitis. I do think this with the UA consistent with infection is likely the cause of the patient's symptoms. Given allergies (pcn/sulfa) did choose cipro as agent. Did order urine culture as well. Discussed findings consistent with diarrhea illness (she did have diarrhea in the ED). Discussed that it is typically self limiting but if continues she can talk to PCP about stool sample.    ____________________________________________   FINAL CLINICAL IMPRESSION(S) / ED DIAGNOSES  Final diagnoses:  Lower urinary tract infection  Abdominal pain, unspecified abdominal location  Diarrhea, unspecified type     Note: This dictation was prepared with Dragon dictation. Any transcriptional errors that result from this process are unintentional     Nance Pear, MD 08/10/17 2328

## 2017-08-10 NOTE — ED Notes (Signed)
Pt changed and given new pad, underwear and pants

## 2017-08-10 NOTE — ED Notes (Signed)
Pt reports that she fell approx 1 week ago. States lower back pain since and pain to right hip and sacrum area. Today she has been incontinent of urine; blood urine. Pt tearful. Pt alert and oriented X4, active, cooperative, pt in NAD. RR even and unlabored, color WNL.

## 2017-08-10 NOTE — Discharge Instructions (Signed)
Please seek medical attention for any high fevers, chest pain, shortness of breath, change in behavior, persistent vomiting, bloody stool or any other new or concerning symptoms.  

## 2017-08-10 NOTE — Addendum Note (Signed)
Addended by: Docia Furl on: 08/10/2017 03:11 PM   Modules accepted: Orders

## 2017-08-10 NOTE — ED Notes (Signed)
Report to [place], [place]

## 2017-08-10 NOTE — ED Notes (Signed)
Patient transported to CT 

## 2017-08-10 NOTE — ED Notes (Signed)
MRI at bedside

## 2017-08-10 NOTE — ED Notes (Signed)
Pt in MRI.

## 2017-08-11 ENCOUNTER — Telehealth: Payer: Self-pay | Admitting: Interventional Cardiology

## 2017-08-11 ENCOUNTER — Encounter (HOSPITAL_COMMUNITY): Payer: Self-pay

## 2017-08-11 ENCOUNTER — Ambulatory Visit (HOSPITAL_COMMUNITY)
Admission: RE | Admit: 2017-08-11 | Discharge: 2017-08-11 | Disposition: A | Discharge status: Home / Self Care | Payer: Medicare Other | Source: Ambulatory Visit | Attending: Nurse Practitioner | Admitting: Nurse Practitioner

## 2017-08-11 DIAGNOSIS — I5022 Chronic systolic (congestive) heart failure: Secondary | ICD-10-CM

## 2017-08-11 MED ORDER — PREDNISONE 50 MG PO TABS: ORAL_TABLET | ORAL | 0 refills | Status: DC

## 2017-08-11 NOTE — Telephone Encounter (Signed)
I can do but will not be able to write/mail until I am back in the office - will be dependent on the weather.   Cecille Rubin

## 2017-08-11 NOTE — Telephone Encounter (Signed)
Noted. I apologize this was not picked up on earlier.  Orders can be sent in my name if needed.

## 2017-08-11 NOTE — Progress Notes (Signed)
Pt scheduled for cardiac MRI, but chart states pt got SOB and had reaction to gad with a study done in 2015 at GI.  Pt MUST receive a steroid prep prior to having this study done.  Thank you.    Steroid prep: 24ml prednisone PO 13,7,1 hour before injection 1-1.25mg /kg (not to exceed 50mg ) IV/PO within 1 hour prior to the injection

## 2017-08-11 NOTE — ED Notes (Addendum)
Patient discharge and follow up information reviewed with patient by ED nursing staff and patient given the opportunity to ask questions pertaining to ED visit and discharge plan of care. Patient advised that should symptoms not continue to improve, resolve entirely, or should new symptoms develop then a follow up visit with their PCP or a return visit to the ED may be warranted. Patient verbalized consent and understanding of discharge plan of care including potential need for further evaluation. Patient being discharged in stable condition per attending ED physician on duty.   Pt alert and oriented, ambulatory without difficulty at this time of DC.

## 2017-08-11 NOTE — Telephone Encounter (Signed)
Spoke with radiology staff.  Pt has allergy to Gadolinium and needs Prednisone and Benadryl prior to CT.  Radiology states they follow the Prednisone 50mg  13hrs, 7 hrs and 1 hr prior to MRI and take Benadryl 25mg  with last dose of Prednisone.  They just sent pt home.  Pt needs to be instructed of medication instructions and have prescription sent in.  Will try to contact pt before leaving today but office does close early and pt just leaving hospital now.

## 2017-08-11 NOTE — ED Notes (Signed)
Pt reports her ride that was going to take her home is no longer available. Pt notified that due to getting morphine she is not able to drive home at this time. Pt states she will try to find another ride.

## 2017-08-11 NOTE — Telephone Encounter (Signed)
New message     Patient has allergy to dye , no preparations were done , needs to be advised.

## 2017-08-11 NOTE — Telephone Encounter (Signed)
Follow up    Patient calling request a prescription for valium be mailed to her. Please call

## 2017-08-11 NOTE — ED Notes (Signed)
Pt given ginger ale, pt states ride is not available. After checking with charge rn, pt will need to wait till 0230 to be able to drive home. Pt verbalized understanding of this. Pt in NAD at this time. IV removed per pt request.

## 2017-08-11 NOTE — Telephone Encounter (Signed)
Spoke with pt and she asked about getting Valium again d/t issues with claustrophobia.  Spoke with pt about Prednisone and Benadryl instructions.  Pt told me that paperwork given to her by radiology said something totally different.  Advised I would contact radiology to clarify instructions.  Pt states she will buy OTC Benadryl.  Spoke with Rachel Moulds? At radiology and clarified that instructions are Prednisone 50mg  13 hour, 7 hours and 1 hour prior to MRI and take Benadryl 50mg  1 hr prior to MRI.  Attempted to call pt back to go over instructions.  Left voicemail to call back.  Will send in prescription for Prednisone.  Will send message to Cecille Rubin in regards to Valium prescription as I am out of the office until Wednesday.

## 2017-08-13 LAB — URINE CULTURE
Culture: >=100000 — AB
MICRO NUMBER:: 81375139
SPECIMEN QUALITY:: ADEQUATE

## 2017-08-15 ENCOUNTER — Other Ambulatory Visit: Payer: Self-pay

## 2017-08-15 NOTE — Telephone Encounter (Signed)
90 DAY

## 2017-08-16 ENCOUNTER — Encounter: Payer: Self-pay | Admitting: Interventional Cardiology

## 2017-08-16 ENCOUNTER — Other Ambulatory Visit: Payer: Self-pay | Admitting: Nurse Practitioner

## 2017-08-16 MED ORDER — DIAZEPAM 10 MG PO TABS: 10.0000 mg | ORAL_TABLET | Freq: Once | ORAL | 0 refills | Status: DC | PRN

## 2017-08-16 NOTE — Telephone Encounter (Signed)
Spoke with pt and went over instructions for test.  Prescription obtained from Truitt Merle, NP.  Pt aware that she will need a driver since she is taking Valium and Benadryl prior to MRI.  Pt verbalized understanding and was appreciative for call.

## 2017-08-16 NOTE — Progress Notes (Signed)
Written RX for Valium to take prior to MRI scan.  Burtis Junes, RN, [facility] [address] [place], [place]  [id number] [phone]

## 2017-08-18 ENCOUNTER — Telehealth: Payer: Self-pay | Admitting: Interventional Cardiology

## 2017-08-18 NOTE — Telephone Encounter (Signed)
Made pt aware to keep appt with Dr. Tamala Julian on Tuesday to f/u on MRI.  Pt verbalized understanding and was in agreement with this plan.

## 2017-08-18 NOTE — Telephone Encounter (Signed)
Patient calling, would like to verify if she needs to keep appt with Dr. Tamala Julian which is scheduled for 08-22-17, a day after her MRI?

## 2017-08-21 ENCOUNTER — Ambulatory Visit (HOSPITAL_COMMUNITY)
Admission: RE | Admit: 2017-08-21 | Discharge: 2017-08-21 | Disposition: A | Discharge status: Home / Self Care | Payer: Medicare Other | Source: Ambulatory Visit | Attending: Nurse Practitioner | Admitting: Nurse Practitioner

## 2017-08-21 DIAGNOSIS — I5022 Chronic systolic (congestive) heart failure: Secondary | ICD-10-CM | POA: Diagnosis not present

## 2017-08-21 DIAGNOSIS — I429 Cardiomyopathy, unspecified: Secondary | ICD-10-CM | POA: Diagnosis not present

## 2017-08-21 MED ORDER — GADOBENATE DIMEGLUMINE 529 MG/ML IV SOLN
36.0000 mL | Freq: Once | INTRAVENOUS | Status: AC | PRN
  Administered 2017-08-21: 36 mL via INTRAVENOUS

## 2017-08-21 NOTE — Progress Notes (Signed)
Cardiology Office Note    Date:  08/22/2017   ID:  Patricia Fischer, DOB 02-Apr-1956, MRN [id number]  PCP:  Arnetha Courser, MD  Cardiologist: Sinclair Grooms, MD   Chief Complaint  Patient presents with  . Congestive Heart Failure    Idiopathic cardiomyopathy    History of Present Illness:  Patricia Fischer is a 61 y.o. female  history of old pulmonary embolism, chronic systolic heart failure, essential hypertension, and chronic anticoagulation therapy.  EF 52% by MRI December 2018.  Patricia Fischer is doing well.  She has appropriate dyspnea on exertion.  She has not had hypotension or dizziness on her current medical regimen.  She is compliant.  Recent cardiac MRI demonstrated normal LV cavity size and EF of 52%.  Global hypokinesis was described.   Past Medical History:  Diagnosis Date  . Anxiety   . Asthma   . Blood transfusion [place]  . Breast cancer ([place]) 04/26/2008   left breast  . Breast cancer, left breast ([place]) 2011   DCIS  . Chronic migraine without aura, with intractable migraine, so stated, with status migrainosus March 2016  . Chronic systolic CHF (congestive heart failure) ([place]) 05/05/2015   a. Dx 04/2015 - EF initially 25-30% with RV dysfunction, f/u echo 08/2015 technically difficult, EF 35-40%, anterior, anteroseptal, apical and inferoapical severe hypokinesis suggestive of LAD territory ischemia/infarct, grade 1 DD, mild MR, RV normal.  . Depression   . DVT (deep venous thrombosis) ([place]) Aug. 2016   a. Dx 04/2015 - patient returned 05/2015 after noncompliance with Xarelto.  . Environmental allergies   . Essential hypertension   . Family history of adverse reaction to anesthesia    " My Mother would get deathly sick "  . GERD (gastroesophageal reflux disease)   . Head injury 2011  . Hemorrhoids   . LBBB (left bundle branch block)   . Obesity   . Orthostatic hypotension   . Pneumonia 2012  . Saddle pulmonary embolus ([place]) Aug. 2016   a. Dx  04/2015 - patient returned 05/2015 after noncompliance with Xarelto.  . Shortness of breath dyspnea   . Syncope    a. Recurrent syncope in 2016 felt 2/2 orthostasis in setting of PE.    Past Surgical History:  Procedure Laterality Date  . ABDOMINAL HYSTERECTOMY  1986  . ANAL FISSURE REPAIR    . ANTERIOR CERVICAL DECOMP/DISCECTOMY FUSION  01/23/2012   Procedure: ANTERIOR CERVICAL DECOMPRESSION/DISCECTOMY FUSION 1 LEVEL;  Surgeon: Elaina Hoops, MD;  Location: [facility];  Service: Neurosurgery;  Laterality: N/A;  Cervical five-six anterior cervical discectomy fusion with plating  . APPENDECTOMY    . BREAST BIOPSY Left 06/2016   US biopsy  . BREAST SURGERY Left 2011   Breast lumpectomy  . CARDIAC CATHETERIZATION  2010   [facility]; pt states it was "clear"  . CARDIAC CATHETERIZATION N/A 10/28/2015   Procedure: Right/Left Heart Cath and Coronary Angiography;  Surgeon: Belva Crome, MD;  Location: [place] [facility];  Service: Cardiovascular;  Laterality: N/A;  . CARPAL TUNNEL RELEASE Bilateral   . CESAREAN SECTION  1982, 1979  . CHOLECYSTECTOMY    . COLONOSCOPY  01/16/2009   Dr Bary Castilla  . KNEE CARTILAGE SURGERY Left   . SHOULDER ARTHROSCOPY WITH SUBACROMIAL DECOMPRESSION, ROTATOR CUFF REPAIR AND BICEP TENDON REPAIR Right 11/22/2013   Procedure: RIGHT SHOULDER ATHROSCOPY OPEN SUBSCAP REPAIR DELTA-PECTORAL ;  Surgeon: Augustin Schooling, MD;  Location: [facility];  Service: Orthopedics;  Laterality: Right;  . TARSAL TUNNEL RELEASE Left 1990  . TONSILLECTOMY    . TUBAL LIGATION      Current Medications: Outpatient Medications Prior to Visit  Medication Sig Dispense Refill  . albuterol (PROAIR HFA) 108 (90 Base) MCG/ACT inhaler Inhale 2 puffs into the lungs every 4 (four) hours as needed for wheezing or shortness of breath. 1 Inhaler 1  . amitriptyline (ELAVIL) 25 MG tablet Take 3 tablets (75 mg total) by mouth at bedtime. 90 tablet 2  . carvedilol (COREG) 6.25 MG tablet Take 1 tablet (6.25  mg total) by mouth 2 (two) times daily with a meal. 180 tablet 3  . fluticasone (FLONASE) 50 MCG/ACT nasal spray Place 1 spray into both nostrils daily as needed for allergies or rhinitis. 16 g 11  . losartan (COZAAR) 25 MG tablet Take 25 mg by mouth daily.    . rivaroxaban (XARELTO) 20 MG TABS tablet TAKE 1 TABLET BY MOUTH DAILY WITH SUPPER. 90 tablet 3  . spironolactone (ALDACTONE) 25 MG tablet TAKE 1 TABLET BY MOUTH EVERY MONDAY, WEDNESDAY, AND FRIDAY. 42 tablet 3  . tiZANidine (ZANAFLEX) 2 MG tablet TAKE 1 TO 2 TABLETS BY MOUTH EVERY EIGHT HOURS AS NEEDED FOR MUSCLE SPASMS 30 tablet 0  . diazepam (VALIUM) 10 MG tablet Take 1 tablet (10 mg total) by mouth once as needed for up to 1 dose for anxiety (prior to MRI). (Patient not taking: Reported on 08/22/2017) 1 tablet 0  . Insulin Pen Needle (PEN NEEDLES) 31G X 6 MM MISC For use with Saxenda pen; inject medicine daily subcutaneously (Patient not taking: Reported on 08/22/2017) 50 each 0  . Liraglutide -Weight Management (SAXENDA) 18 MG/3ML SOPN Inject 0.6 mg daily into the skin. x 1 week, then 1.2 mg daily x 1 week, then 1.8 mg daily x 1 week, then 2.4 mg daily x 1 week, then 3 mg daily (Patient not taking: Reported on 08/22/2017) 3 mL 0  . losartan (COZAAR) 25 MG tablet Take 1 tablet (25 mg total) by mouth daily. 90 tablet 3  . phenazopyridine (PYRIDIUM) 200 MG tablet Take 1 tablet (200 mg total) by mouth 3 (three) times daily as needed for pain. (Patient not taking: Reported on 08/22/2017) 20 tablet 0  . predniSONE (DELTASONE) 50 MG tablet Take 1 tablet by mouth 13 hours, 7 hours and 1 hour prior to your MRI. (Patient not taking: Reported on 08/22/2017) 3 tablet 0   No facility-administered medications prior to visit.      Allergies:   Gadolinium derivatives; Sulfa antibiotics; Aripiprazole; Codeine; Oxycodone; and Penicillins   Social History   Socioeconomic History  . Marital status: Divorced    Spouse name: None  . Number of children: 2   . Years of education: college-1  . Highest education level: None  Social Needs  . Financial resource strain: None  . Food insecurity - worry: None  . Food insecurity - inability: None  . Transportation needs - medical: None  . Transportation needs - non-medical: None  Occupational History  . Occupation: Unemployed  Tobacco Use  . Smoking status: Never Smoker  . Smokeless tobacco: Never Used  Substance and Sexual Activity  . Alcohol use: Yes    Comment: occasional wine  . Drug use: No  . Sexual activity: No  Other Topics Concern  . None  Social History Narrative  . None     Family History:  The patient's family history includes Aneurysm in her paternal grandfather;  Breast cancer in her cousin, cousin, cousin, and paternal aunt; Breast cancer (age of onset: 57) in her mother; COPD in her father and paternal grandfather; Cancer in her father and mother; Heart attack in her paternal grandmother; Heart disease in her paternal grandmother; Hypertension in her mother; Migraines in her mother; Rectal cancer in her father; Stroke in her paternal grandmother.   ROS:   Please see the history of present illness.    Anxiety.  Otherwise no complaints. All other systems reviewed and are negative.   PHYSICAL EXAM:   VS:  BP 136/90   Pulse 76   Ht 5\' 4"  (1.626 m)   Wt 241 lb 12.8 oz (109.7 kg)   BMI 41.50 kg/m    GEN: Well nourished, well developed, in no acute distress  HEENT: normal  Neck: no JVD, carotid bruits, or masses Cardiac: RRR; no murmurs, rubs, or gallops,no edema  Respiratory:  clear to auscultation bilaterally, normal work of breathing GI: soft, nontender, nondistended, + BS MS: no deformity or atrophy  Skin: warm and dry, no rash Neuro:  Alert and Oriented x 3, Strength and sensation are intact Psych: euthymic mood, full affect  Wt Readings from Last 3 Encounters:  08/22/17 241 lb 12.8 oz (109.7 kg)  08/10/17 240 lb (108.9 kg)  08/10/17 240 lb 3.2 oz (109 kg)        Studies/Labs Reviewed:   EKG:  EKG  Not repeateed  Recent Labs: 07/11/2017: TSH 1.41 08/10/2017: ALT 14; BUN 11; Creatinine, Ser 0.76; Hemoglobin 13.4; Platelets 195; Potassium 3.9; Sodium 139   Lipid Panel    Component Value Date/Time   CHOL 172 07/11/2017 1503   TRIG 161 (H) 07/11/2017 1503   HDL 40 (L) 07/11/2017 1503   CHOLHDL 4.3 07/11/2017 1503   VLDL 18 05/17/2016 1454   LDLCALC 56 05/17/2016 1454    Additional studies/ records that were reviewed today include:  Echocardiogram 01/12/2017: Study Conclusions   - Left ventricle: The cavity size was normal. Wall thickness was   increased in a pattern of mild LVH. Systolic function was   moderately to severely reduced. The estimated ejection fraction   was in the range of 30% to 35%. Diffuse hypokinesis. Doppler   parameters are consistent with abnormal left ventricular   relaxation (grade 1 diastolic dysfunction).   Impressions:   - Moderate to severe global reduction in LV systolic function; mild   diastolic dysfunction; trace MR and TR.  Cardiac MRI performed August 21, 2017: IMPRESSION: 1.  Normal LV size with mild diffuse hypokinesis, EF 52%.   2.  Normal RV size and systolic function.   3. No myocardial LGE, so no definitive evidence for prior MI, infiltrative disease, or myocarditis.    ASSESSMENT:    1. Chronic systolic CHF (congestive heart failure) ([place])   2. LBBB (left bundle branch block)-rate related    3. Morbid obesity ([place])   4. Do not resuscitate status   5. Essential hypertension      PLAN:  In order of problems listed above:  1. LVEF is improved to 52% on appropriate heart failure therapy.  She is on Aldactone, very low dose losartan, carvedilol.  Higher doses of losartan led to hypotension.  Next medication change should be to further optimize ARB therapy.  With low normal LVEF the patient feeling well, we will continue the current medical management strategy. 2. Not  reassessed 3. Aerobic activity and decrease caloric intake. 4. We will discuss with her on next  visit.  This seems an appropriate to me. 5. Target is 130/85 mmHg or less.  Clinical follow-up in 6 months.  Recent laboratory data revealed normal kidney function and potassium.  Medication Adjustments/Labs and Tests Ordered: Current medicines are reviewed at length with the patient today.  Concerns regarding medicines are outlined above.  Medication changes, Labs and Tests ordered today are listed in the Patient Instructions below. There are no Patient Instructions on file for this visit.   Signed, Sinclair Grooms, MD  08/22/2017 9:47 AM    [facility] [address], [place], [place]  [address] Phone: [phone]; Fax: [phone]

## 2017-08-22 ENCOUNTER — Encounter: Payer: Self-pay | Admitting: Interventional Cardiology

## 2017-08-22 ENCOUNTER — Ambulatory Visit (INDEPENDENT_AMBULATORY_CARE_PROVIDER_SITE_OTHER): Payer: Medicare Other | Admitting: Interventional Cardiology

## 2017-08-22 VITALS — BP 136/90 | HR 76 | Ht 64.0 in | Wt 241.8 lb

## 2017-08-22 DIAGNOSIS — I10 Essential (primary) hypertension: Secondary | ICD-10-CM

## 2017-08-22 DIAGNOSIS — I447 Left bundle-branch block, unspecified: Secondary | ICD-10-CM

## 2017-08-22 DIAGNOSIS — I5022 Chronic systolic (congestive) heart failure: Secondary | ICD-10-CM

## 2017-08-22 DIAGNOSIS — Z66 Do not resuscitate: Secondary | ICD-10-CM | POA: Diagnosis not present

## 2017-08-22 NOTE — Patient Instructions (Signed)

## 2017-08-31 ENCOUNTER — Telehealth (HOSPITAL_COMMUNITY): Payer: Self-pay

## 2017-08-31 NOTE — Telephone Encounter (Signed)
Attempted to call patient in regards to Cardiac Rehab - Lm on Vm °

## 2017-09-07 ENCOUNTER — Emergency Department: Payer: Medicare Other

## 2017-09-07 ENCOUNTER — Telehealth (HOSPITAL_COMMUNITY): Payer: Self-pay

## 2017-09-07 ENCOUNTER — Ambulatory Visit (INDEPENDENT_AMBULATORY_CARE_PROVIDER_SITE_OTHER): Payer: Medicare Other | Admitting: Family Medicine

## 2017-09-07 ENCOUNTER — Other Ambulatory Visit: Payer: Self-pay

## 2017-09-07 ENCOUNTER — Inpatient Hospital Stay
Admission: EM | Admit: 2017-09-07 | Discharge: 2017-09-10 | DRG: 871 | Disposition: A | Discharge status: Home / Self Care | Payer: Medicare Other | Attending: Internal Medicine | Admitting: Internal Medicine

## 2017-09-07 ENCOUNTER — Encounter (HOSPITAL_COMMUNITY): Payer: Self-pay

## 2017-09-07 ENCOUNTER — Encounter: Payer: Self-pay | Admitting: Emergency Medicine

## 2017-09-07 ENCOUNTER — Encounter: Payer: Self-pay | Admitting: Family Medicine

## 2017-09-07 VITALS — BP 134/86 | HR 138 | Temp 101.1°F | Resp 18 | Ht 64.0 in | Wt 237.9 lb

## 2017-09-07 DIAGNOSIS — R05 Cough: Secondary | ICD-10-CM | POA: Diagnosis not present

## 2017-09-07 DIAGNOSIS — Z9049 Acquired absence of other specified parts of digestive tract: Secondary | ICD-10-CM | POA: Diagnosis not present

## 2017-09-07 DIAGNOSIS — Z86718 Personal history of other venous thrombosis and embolism: Secondary | ICD-10-CM

## 2017-09-07 DIAGNOSIS — I82409 Acute embolism and thrombosis of unspecified deep veins of unspecified lower extremity: Secondary | ICD-10-CM | POA: Diagnosis not present

## 2017-09-07 DIAGNOSIS — A419 Sepsis, unspecified organism: Principal | ICD-10-CM | POA: Diagnosis present

## 2017-09-07 DIAGNOSIS — I272 Pulmonary hypertension, unspecified: Secondary | ICD-10-CM

## 2017-09-07 DIAGNOSIS — R0602 Shortness of breath: Secondary | ICD-10-CM

## 2017-09-07 DIAGNOSIS — J029 Acute pharyngitis, unspecified: Secondary | ICD-10-CM | POA: Diagnosis not present

## 2017-09-07 DIAGNOSIS — R7981 Abnormal blood-gas level: Secondary | ICD-10-CM | POA: Diagnosis not present

## 2017-09-07 DIAGNOSIS — Z853 Personal history of malignant neoplasm of breast: Secondary | ICD-10-CM

## 2017-09-07 DIAGNOSIS — Z7901 Long term (current) use of anticoagulants: Secondary | ICD-10-CM | POA: Diagnosis not present

## 2017-09-07 DIAGNOSIS — R51 Headache: Secondary | ICD-10-CM | POA: Diagnosis present

## 2017-09-07 DIAGNOSIS — F419 Anxiety disorder, unspecified: Secondary | ICD-10-CM | POA: Diagnosis present

## 2017-09-07 DIAGNOSIS — Z880 Allergy status to penicillin: Secondary | ICD-10-CM

## 2017-09-07 DIAGNOSIS — Z86711 Personal history of pulmonary embolism: Secondary | ICD-10-CM | POA: Diagnosis not present

## 2017-09-07 DIAGNOSIS — I5022 Chronic systolic (congestive) heart failure: Secondary | ICD-10-CM | POA: Diagnosis not present

## 2017-09-07 DIAGNOSIS — R000 Tachycardia, unspecified: Secondary | ICD-10-CM

## 2017-09-07 DIAGNOSIS — Z79899 Other long term (current) drug therapy: Secondary | ICD-10-CM | POA: Diagnosis not present

## 2017-09-07 DIAGNOSIS — K219 Gastro-esophageal reflux disease without esophagitis: Secondary | ICD-10-CM | POA: Diagnosis present

## 2017-09-07 DIAGNOSIS — J189 Pneumonia, unspecified organism: Secondary | ICD-10-CM | POA: Diagnosis present

## 2017-09-07 DIAGNOSIS — I110 Hypertensive heart disease with heart failure: Secondary | ICD-10-CM | POA: Diagnosis present

## 2017-09-07 DIAGNOSIS — F329 Major depressive disorder, single episode, unspecified: Secondary | ICD-10-CM | POA: Diagnosis present

## 2017-09-07 DIAGNOSIS — Z90710 Acquired absence of both cervix and uterus: Secondary | ICD-10-CM | POA: Diagnosis not present

## 2017-09-07 DIAGNOSIS — J45909 Unspecified asthma, uncomplicated: Secondary | ICD-10-CM | POA: Diagnosis present

## 2017-09-07 LAB — PROTIME-INR
INR: 1.12
Prothrombin Time: 14.3 seconds (ref 11.4–15.2)

## 2017-09-07 LAB — URINALYSIS, COMPLETE (UACMP) WITH MICROSCOPIC
Bacteria, UA: NONE SEEN
Bilirubin Urine: NEGATIVE
GLUCOSE, UA: NEGATIVE mg/dL
HGB URINE DIPSTICK: NEGATIVE
Ketones, ur: NEGATIVE mg/dL
Leukocytes, UA: NEGATIVE
Nitrite: NEGATIVE
PROTEIN: NEGATIVE mg/dL
RBC / HPF: NONE SEEN RBC/hpf (ref 0–5)
SPECIFIC GRAVITY, URINE: 1.002 — AB (ref 1.005–1.030)
pH: 7 (ref 5.0–8.0)

## 2017-09-07 LAB — CBC WITH DIFFERENTIAL/PLATELET
Basophils Absolute: 0 K/uL (ref 0–0.1)
Basophils Relative: 0 %
Eosinophils Absolute: 0 K/uL (ref 0–0.7)
Eosinophils Relative: 0 %
HCT: 41.5 % (ref 35.0–47.0)
Hemoglobin: 13.6 g/dL (ref 12.0–16.0)
Lymphocytes Relative: 8 %
Lymphs Abs: 1.2 K/uL (ref 1.0–3.6)
MCH: 27.6 pg (ref 26.0–34.0)
MCHC: 32.8 g/dL (ref 32.0–36.0)
MCV: 84.3 fL (ref 80.0–100.0)
Monocytes Absolute: 1 K/uL — ABNORMAL HIGH (ref 0.2–0.9)
Monocytes Relative: 7 %
Neutro Abs: 12.6 K/uL — ABNORMAL HIGH (ref 1.4–6.5)
Neutrophils Relative %: 85 %
Platelets: 172 K/uL (ref 150–440)
RBC: 4.92 MIL/uL (ref 3.80–5.20)
RDW: 13.8 % (ref 11.5–14.5)
WBC: 14.9 K/uL — ABNORMAL HIGH (ref 3.6–11.0)

## 2017-09-07 LAB — COMPREHENSIVE METABOLIC PANEL WITH GFR
ALT: 12 U/L — ABNORMAL LOW (ref 14–54)
AST: 23 U/L (ref 15–41)
Albumin: 3.9 g/dL (ref 3.5–5.0)
Alkaline Phosphatase: 96 U/L (ref 38–126)
Anion gap: 9 (ref 5–15)
BUN: 10 mg/dL (ref 6–20)
CO2: 28 mmol/L (ref 22–32)
Calcium: 9 mg/dL (ref 8.9–10.3)
Chloride: 99 mmol/L — ABNORMAL LOW (ref 101–111)
Creatinine, Ser: 0.93 mg/dL (ref 0.44–1.00)
GFR calc Af Amer: >60 mL/min
GFR calc non Af Amer: >60 mL/min
Glucose, Bld: 148 mg/dL — ABNORMAL HIGH (ref 65–99)
Potassium: 3.9 mmol/L (ref 3.5–5.1)
Sodium: 136 mmol/L (ref 135–145)
Total Bilirubin: 0.9 mg/dL (ref 0.3–1.2)
Total Protein: 7.9 g/dL (ref 6.5–8.1)

## 2017-09-07 LAB — GROUP A STREP BY PCR: GROUP A STREP BY PCR: NOT DETECTED

## 2017-09-07 LAB — LACTIC ACID, PLASMA
LACTIC ACID, VENOUS: 1.6 mmol/L (ref 0.5–1.9)
Lactic Acid, Venous: 1.9 mmol/L (ref 0.5–1.9)

## 2017-09-07 MED ORDER — HYDROCOD POLST-CPM POLST ER 10-8 MG/5ML PO SUER
5.0000 mL | Freq: Two times a day (BID) | ORAL | Status: DC
  Administered 2017-09-07 – 2017-09-10 (×7): 5 mL via ORAL
  Filled 2017-09-07 (×7): qty 5

## 2017-09-07 MED ORDER — FLUTICASONE PROPIONATE 50 MCG/ACT NA SUSP
1.0000 | Freq: Every day | NASAL | Status: DC | PRN
  Filled 2017-09-07: qty 16

## 2017-09-07 MED ORDER — VANCOMYCIN HCL IN DEXTROSE 1-5 GM/200ML-% IV SOLN
1000.0000 mg | Freq: Once | INTRAVENOUS | Status: AC
  Administered 2017-09-07: 1000 mg via INTRAVENOUS
  Filled 2017-09-07: qty 200

## 2017-09-07 MED ORDER — DIPHENHYDRAMINE HCL 50 MG/ML IJ SOLN
25.0000 mg | Freq: Once | INTRAMUSCULAR | Status: AC
  Administered 2017-09-07: 25 mg via INTRAVENOUS
  Filled 2017-09-07: qty 1

## 2017-09-07 MED ORDER — ACETAMINOPHEN 325 MG PO TABS: 650.0000 mg | ORAL_TABLET | Freq: Four times a day (QID) | ORAL | Status: DC | PRN

## 2017-09-07 MED ORDER — DEXTROSE 5 % IV SOLN
1.0000 g | INTRAVENOUS | Status: DC
  Administered 2017-09-07 – 2017-09-09 (×3): 1 g via INTRAVENOUS
  Filled 2017-09-07 (×4): qty 10

## 2017-09-07 MED ORDER — SPIRONOLACTONE 25 MG PO TABS: 25.0000 mg | ORAL_TABLET | ORAL | Status: DC

## 2017-09-07 MED ORDER — CARVEDILOL 6.25 MG PO TABS
6.2500 mg | ORAL_TABLET | ORAL | Status: AC
  Filled 2017-09-07: qty 1

## 2017-09-07 MED ORDER — IPRATROPIUM-ALBUTEROL 0.5-2.5 (3) MG/3ML IN SOLN
3.0000 mL | Freq: Four times a day (QID) | RESPIRATORY_TRACT | Status: DC | PRN
  Administered 2017-09-07: 3 mL via RESPIRATORY_TRACT
  Filled 2017-09-07: qty 3

## 2017-09-07 MED ORDER — SODIUM CHLORIDE 0.9 % IV BOLUS (SEPSIS)
500.0000 mL | Freq: Once | INTRAVENOUS | Status: AC
  Administered 2017-09-07: 500 mL via INTRAVENOUS

## 2017-09-07 MED ORDER — LEVOFLOXACIN IN D5W 750 MG/150ML IV SOLN
750.0000 mg | Freq: Once | INTRAVENOUS | Status: AC
  Administered 2017-09-07: 750 mg via INTRAVENOUS
  Filled 2017-09-07: qty 150

## 2017-09-07 MED ORDER — CARVEDILOL 12.5 MG PO TABS: 12.5000 mg | ORAL_TABLET | Freq: Two times a day (BID) | ORAL | Status: DC

## 2017-09-07 MED ORDER — LEVOFLOXACIN IN D5W 750 MG/150ML IV SOLN: 750.0000 mg | INTRAVENOUS | Status: DC

## 2017-09-07 MED ORDER — AMITRIPTYLINE HCL 25 MG PO TABS
75.0000 mg | ORAL_TABLET | Freq: Every evening | ORAL | Status: DC | PRN
  Filled 2017-09-07: qty 3

## 2017-09-07 MED ORDER — TRAMADOL HCL 50 MG PO TABS
50.0000 mg | ORAL_TABLET | Freq: Four times a day (QID) | ORAL | Status: DC | PRN
  Administered 2017-09-08 – 2017-09-09 (×5): 50 mg via ORAL
  Filled 2017-09-07 (×5): qty 1

## 2017-09-07 MED ORDER — POLYETHYLENE GLYCOL 3350 17 G PO PACK: 17.0000 g | PACK | Freq: Every day | ORAL | Status: DC | PRN

## 2017-09-07 MED ORDER — IPRATROPIUM-ALBUTEROL 0.5-2.5 (3) MG/3ML IN SOLN
3.0000 mL | Freq: Once | RESPIRATORY_TRACT | Status: AC
  Administered 2017-09-07: 3 mL via RESPIRATORY_TRACT
  Filled 2017-09-07: qty 3

## 2017-09-07 MED ORDER — BENZONATATE 100 MG PO CAPS
200.0000 mg | ORAL_CAPSULE | Freq: Three times a day (TID) | ORAL | Status: DC
  Administered 2017-09-07 – 2017-09-10 (×9): 200 mg via ORAL
  Filled 2017-09-07 (×9): qty 2

## 2017-09-07 MED ORDER — LOSARTAN POTASSIUM 25 MG PO TABS
25.0000 mg | ORAL_TABLET | Freq: Every day | ORAL | Status: DC
  Administered 2017-09-08 – 2017-09-09 (×2): 25 mg via ORAL
  Filled 2017-09-07 (×2): qty 1

## 2017-09-07 MED ORDER — ACETAMINOPHEN 650 MG RE SUPP: 650.0000 mg | Freq: Four times a day (QID) | RECTAL | Status: DC | PRN

## 2017-09-07 MED ORDER — CARVEDILOL 6.25 MG PO TABS: 6.2500 mg | ORAL_TABLET | Freq: Two times a day (BID) | ORAL | Status: DC

## 2017-09-07 MED ORDER — ACETAMINOPHEN 500 MG PO TABS
1000.0000 mg | ORAL_TABLET | ORAL | Status: AC
  Administered 2017-09-07: 1000 mg via ORAL
  Filled 2017-09-07: qty 2

## 2017-09-07 MED ORDER — RIVAROXABAN 20 MG PO TABS
20.0000 mg | ORAL_TABLET | Freq: Every day | ORAL | Status: DC
  Administered 2017-09-07 – 2017-09-09 (×3): 20 mg via ORAL
  Filled 2017-09-07 (×4): qty 1

## 2017-09-07 MED ORDER — MENTHOL 3 MG MT LOZG
1.0000 | LOZENGE | OROMUCOSAL | Status: DC | PRN
  Administered 2017-09-07: 3 mg via ORAL
  Filled 2017-09-07: qty 9

## 2017-09-07 MED ORDER — DEXTROSE 5 % IV SOLN
500.0000 mg | INTRAVENOUS | Status: DC
  Administered 2017-09-08 – 2017-09-10 (×3): 500 mg via INTRAVENOUS
  Filled 2017-09-07 (×4): qty 500

## 2017-09-07 MED ORDER — AMITRIPTYLINE HCL 25 MG PO TABS
75.0000 mg | ORAL_TABLET | Freq: Every day | ORAL | Status: DC
  Filled 2017-09-07: qty 1

## 2017-09-07 MED ORDER — SPIRONOLACTONE 25 MG PO TABS: 25.0000 mg | ORAL_TABLET | Freq: Every day | ORAL | Status: DC

## 2017-09-07 MED ORDER — CYCLOBENZAPRINE HCL 10 MG PO TABS
5.0000 mg | ORAL_TABLET | Freq: Three times a day (TID) | ORAL | Status: AC
  Administered 2017-09-07 – 2017-09-09 (×6): 5 mg via ORAL
  Filled 2017-09-07 (×6): qty 1

## 2017-09-07 MED ORDER — LOSARTAN POTASSIUM 25 MG PO TABS: 25.0000 mg | ORAL_TABLET | Freq: Every day | ORAL | Status: DC

## 2017-09-07 MED ORDER — CARVEDILOL 12.5 MG PO TABS
12.5000 mg | ORAL_TABLET | Freq: Two times a day (BID) | ORAL | Status: DC
  Administered 2017-09-07 – 2017-09-08 (×3): 12.5 mg via ORAL
  Filled 2017-09-07 (×3): qty 1

## 2017-09-07 MED ORDER — VANCOMYCIN HCL IN DEXTROSE 1-5 GM/200ML-% IV SOLN: 1000.0000 mg | Freq: Two times a day (BID) | INTRAVENOUS | Status: DC

## 2017-09-07 NOTE — Patient Instructions (Signed)
Go directly to [facility] ER for further evaluation of your tachycardia, shortness of breath, and low ambulatory oxygen levels.

## 2017-09-07 NOTE — Progress Notes (Signed)
Pharmacy Antibiotic Note  Patricia Fischer is a 62 y.o. female admitted on 09/07/2017 with pneumonia.  Pharmacy has been consulted for ceftriaxone dosing.  Patient has an allergy to penicillin listed in chart. Per MD admission note, MD is aware and would like to continue with ceftriaxone and monitor patient. Spoke with RN, advised to monitor for any signs/symptoms of reaction during infusion.  Plan: Ceftriaxone 1 g IV daily  Height: 5\' 4"  (162.6 cm) Weight: 240 lb (108.9 kg) IBW/kg (Calculated) : 54.7  Temp (24hrs), Avg:99.9 F (37.7 C), Min:98.5 F (36.9 C), Max:101.1 F (38.4 C)  Recent Labs  Lab 09/07/17 0943 09/07/17 1028 09/07/17 1340  WBC  --  14.9*  --   CREATININE  --  0.93  --   LATICACIDVEN 1.9  --  1.6    Estimated Creatinine Clearance: 75.6 mL/min (by C-G formula based on SCr of 0.93 mg/dL).    Allergies  Allergen Reactions  . Gadolinium Derivatives Shortness Of Breath and Nausea And Vomiting  . Levaquin [Levofloxacin In D5w] Itching    Was giving with vancomycin at same time - unsure which one pt had a reaction to  . Sulfa Antibiotics Swelling    "Ears swelled up like Dumbo"  . Vancomycin Itching    Was giving at same time as Levaquin - unsure which pt had reaction to  . Aripiprazole     Dry mouth with sores   . Codeine Itching and Rash  . Oxycodone Itching  . Penicillins Hives    Has patient had a PCN reaction causing immediate rash, facial/tongue/throat swelling, SOB or lightheadedness with hypotension: Yes Has patient had a PCN reaction causing severe rash involving mucus membranes or skin necrosis: No Has patient had a PCN reaction that required hospitalization No Has patient had a PCN reaction occurring within the last 10 years: No If all of the above answers are "NO", then may proceed with Cephalosporin use.    Antimicrobials this admission: ceftriaxone 1/3 >>  Levofloxacin and vancomycin  1/3 in ED  Dose adjustments this  admission:  Microbiology results: 1/3 BCx: Sent 1/3 Strep: Not detected  Thank you for allowing pharmacy to be a part of this patient's care.  Lenis Noon, PharmD, BCPS Clinical Pharmacist 09/07/2017 2:28 PM

## 2017-09-07 NOTE — ED Triage Notes (Signed)
Pt to ed with c/o sore throat and fever.  Sent from [place] office for evaluation of sepsis.  Pt states sats dropped when walking today in the office.  Pt in no acute resp distress at this time, congested cough noted.  Pt alert and oriented.  States she has been sick since yesterday.

## 2017-09-07 NOTE — Progress Notes (Signed)
ANTIBIOTIC CONSULT NOTE - INITIAL  Pharmacy Consult for levofloxacin and vancomycin Indication: pneumonia  Allergies  Allergen Reactions  . Gadolinium Derivatives Shortness Of Breath and Nausea And Vomiting  . Sulfa Antibiotics Swelling    "Ears swelled up like Dumbo"  . Aripiprazole     Dry mouth with sores   . Codeine Itching and Rash  . Oxycodone Itching  . Penicillins Hives    Has patient had a PCN reaction causing immediate rash, facial/tongue/throat swelling, SOB or lightheadedness with hypotension: Yes Has patient had a PCN reaction causing severe rash involving mucus membranes or skin necrosis: No Has patient had a PCN reaction that required hospitalization No Has patient had a PCN reaction occurring within the last 10 years: No If all of the above answers are "NO", then may proceed with Cephalosporin use.    Patient Measurements: Height: 5\' 4"  (162.6 cm) Weight: 237 lb (107.5 kg) IBW/kg (Calculated) : 54.7 Adjusted Body Weight:   Vital Signs: Temp: 100.2 F (37.9 C) (01/03 0925) Temp Source: Oral (01/03 0925) BP: 120/72 (01/03 0925) Pulse Rate: 128 (01/03 0925) Intake/Output from previous day: No intake/output data recorded. Intake/Output from this shift: No intake/output data recorded.  Labs: Recent Labs    09/07/17 1028  WBC 14.9*  HGB 13.6  PLT 172  CREATININE 0.93   Estimated Creatinine Clearance: 75.1 mL/min (by C-G formula based on SCr of 0.93 mg/dL). No results for input(s): VANCOTROUGH, VANCOPEAK, VANCORANDOM, GENTTROUGH, GENTPEAK, GENTRANDOM, TOBRATROUGH, TOBRAPEAK, TOBRARND, AMIKACINPEAK, AMIKACINTROU, AMIKACIN in the last 72 hours.   Microbiology: Recent Results (from the past 720 hour(s))  Urine Culture     Status: Abnormal   Collection Time: 08/10/17  3:24 PM  Result Value Ref Range Status   Specimen Description URINE, RANDOM  Final   Special Requests NONE  Final   Culture >=100,000 COLONIES/mL ESCHERICHIA COLI (A)  Final   Report  Status 08/13/2017 FINAL  Final   Organism ID, Bacteria ESCHERICHIA COLI (A)  Final      Susceptibility   Escherichia coli - MIC*    AMPICILLIN 8 SENSITIVE Sensitive     CEFAZOLIN <=4 SENSITIVE Sensitive     CEFTRIAXONE <=1 SENSITIVE Sensitive     CIPROFLOXACIN <=0.25 SENSITIVE Sensitive     GENTAMICIN <=1 SENSITIVE Sensitive     IMIPENEM <=0.25 SENSITIVE Sensitive     NITROFURANTOIN <=16 SENSITIVE Sensitive     TRIMETH/SULFA <=20 SENSITIVE Sensitive     AMPICILLIN/SULBACTAM <=2 SENSITIVE Sensitive     PIP/TAZO <=4 SENSITIVE Sensitive     Extended ESBL NEGATIVE Sensitive     * >=100,000 COLONIES/mL ESCHERICHIA COLI  Urine Culture     Status: Abnormal   Collection Time: 08/10/17  4:38 PM  Result Value Ref Range Status   MICRO NUMBER: [id number]  Final   SPECIMEN QUALITY: ADEQUATE  Final   Sample Source URINE  Final   STATUS: FINAL  Final   ISOLATE 1: Escherichia coli (A)  Final      Susceptibility   Escherichia coli - URINE CULTURE, REFLEX    AMOX/CLAVULANIC 4 Sensitive     AMPICILLIN 4 Sensitive     AMPICILLIN/SULBACTAM <=2 Sensitive     CEFAZOLIN* <=4 Not Reportable      * For infections other than uncomplicated UTIcaused by E. coli, K. pneumoniae or P. mirabilis:Cefazolin is resistant if MIC > or = 8 mcg/mL.(Distinguishing susceptible versus intermediatefor isolates with MIC < or = 4 mcg/mL requiresadditional testing.)For uncomplicated UTI caused by E. coli,K. pneumoniae or P.  mirabilis: Cefazolin issusceptible if MIC <32 mcg/mL and predictssusceptible to the oral agents cefaclor, cefdinir,cefpodoxime, cefprozil, cefuroxime, cephalexinand loracarbef.    CEFEPIME <=1 Sensitive     CEFTRIAXONE <=1 Sensitive     CIPROFLOXACIN <=0.25 Sensitive     LEVOFLOXACIN 1 Sensitive     ERTAPENEM <=0.5 Sensitive     GENTAMICIN <=1 Sensitive     IMIPENEM <=0.25 Sensitive     NITROFURANTOIN <=16 Sensitive     PIP/TAZO <=4 Sensitive     TOBRAMYCIN <=1 Sensitive     TRIMETH/SULFA* <=20 Sensitive       * For infections other than uncomplicated UTIcaused by E. coli, K. pneumoniae or P. mirabilis:Cefazolin is resistant if MIC > or = 8 mcg/mL.(Distinguishing susceptible versus intermediatefor isolates with MIC < or = 4 mcg/mL requiresadditional testing.)For uncomplicated UTI caused by E. coli,K. pneumoniae or P. mirabilis: Cefazolin issusceptible if MIC <32 mcg/mL and predictssusceptible to the oral agents cefaclor, cefdinir,cefpodoxime, cefprozil, cefuroxime, cephalexinand loracarbef.Legend:S = Susceptible  I = IntermediateR = Resistant  NS = Not susceptible* = Not tested  NR = Not reported**NN = See antimicrobic comments    Medical History: Past Medical History:  Diagnosis Date  . Anxiety   . Asthma   . Blood transfusion [place]  . Breast cancer ([place]) 04/26/2008   left breast  . Breast cancer, left breast ([place]) 2011   DCIS  . Chronic migraine without aura, with intractable migraine, so stated, with status migrainosus March 2016  . Chronic systolic CHF (congestive heart failure) ([place]) 05/05/2015   a. Dx 04/2015 - EF initially 25-30% with RV dysfunction, f/u echo 08/2015 technically difficult, EF 35-40%, anterior, anteroseptal, apical and inferoapical severe hypokinesis suggestive of LAD territory ischemia/infarct, grade 1 DD, mild MR, RV normal.  . Depression   . DVT (deep venous thrombosis) ([place]) Aug. 2016   a. Dx 04/2015 - patient returned 05/2015 after noncompliance with Xarelto.  . Environmental allergies   . Essential hypertension   . Family history of adverse reaction to anesthesia    " My Mother would get deathly sick "  . GERD (gastroesophageal reflux disease)   . Head injury 2011  . Hemorrhoids   . LBBB (left bundle branch block)   . Obesity   . Orthostatic hypotension   . Pneumonia 2012  . Saddle pulmonary embolus ([place]) Aug. 2016   a. Dx 04/2015 - patient returned 05/2015 after noncompliance with Xarelto.  . Shortness of breath dyspnea   . Syncope    a. Recurrent  syncope in 2016 felt 2/2 orthostasis in setting of PE.    Medications:  Infusions:  . levofloxacin (LEVAQUIN) IV 750 mg (09/07/17 1048)  . [START ON 09/08/2017] levofloxacin (LEVAQUIN) IV    . sodium chloride    . vancomycin 1,000 mg (09/07/17 1048)  . vancomycin     Assessment: 70 yof with sore throat and fever. Pharmacy consulted to dose levofloxacin and vancomycin for pneumonia - code sepsis called.  Goal of Therapy:  Vancomycin trough level 15-20 mcg/ml  Plan:  1. Levofloxacin 750 mg IV Q24H 2. Vancomycin 1 gm IV x 1 in ED followed in approximately 6 hours (stacked dosing) by vancomycin 1 gm IV Q12H, predicted trough 16 mcg/mL. Pharmacy will continue to follow and adjust as needed to maintain trough 15 to 20 mcg/mL.   Vd 52.9 L, Ke 0.066 hr-1, T1/2 10.4 hr   Laural Benes, Pharm.D., BCPS Clinical Pharmacist 09/07/2017,11:24 AM

## 2017-09-07 NOTE — Progress Notes (Signed)
CODE SEPSIS - PHARMACY COMMUNICATION  **[facility] Antibiotics should be administered within 1 hour of Sepsis diagnosis**  Time Code Sepsis Called/Page Received: 1020  Antibiotics Ordered: Levofloxacin/Vancomycin  Time of 1st antibiotic administration: 1048   Additional action taken by pharmacy: N/A  If necessary, Name of Provider/Nurse Contacted: N/A    Opha Mcghee L ,PharmD Clinical Pharmacist  09/07/2017  10:52 AM

## 2017-09-07 NOTE — H&P (Addendum)
[facility] [place] [facility] NAME: Patricia Fischer    MR#:  [id number]  DATE OF BIRTH:  1956-04-30  DATE OF ADMISSION:  09/07/2017  PRIMARY CARE PHYSICIAN: Arnetha Courser, MD   REQUESTING/REFERRING PHYSICIAN: Dr. Delman Kitten  CHIEF COMPLAINT:   Chief Complaint  Patient presents with  . Fever  . Sore Throat    HISTORY OF PRESENT ILLNESS:  Patricia Fischer  is a 62 y.o. female with a known history of breast cancer status post lumpectomy, history of chronic systolic congestive heart failure with known ejection fraction of 30%, history of DVT and PE on Xarelto, presents to hospital secondary to worsening shortness of breath and cough and sore throat. Patient has been exposed to her grandkids who had strep throat couple of days ago. She has been feeling shortness of breath, dry cough for the last 2 days. Fevers and chills at home. Went to see her primary care physician today and was noted to be tachycardic with heart rate in 140s and hypoxic. No history of COPD or asthma. Not on any home oxygen. Chest x-ray here shows right mid lung possible pneumonia. Elevated WBC noted and tachycardia. Being admitted for sepsis secondary to pneumonia. Receiving Levaquin in the emergency room and developing a rash and itching with that.  PAST MEDICAL HISTORY:   Past Medical History:  Diagnosis Date  . Anxiety   . Asthma   . Blood transfusion [place]  . Breast cancer ([place]) 04/26/2008   left breast  . Breast cancer, left breast ([place]) 2011   DCIS  . Chronic migraine without aura, with intractable migraine, so stated, with status migrainosus March 2016  . Chronic systolic CHF (congestive heart failure) ([place]) 05/05/2015   a. Dx 04/2015 - EF initially 25-30% with RV dysfunction, f/u echo 08/2015 technically difficult, EF 35-40%, anterior, anteroseptal, apical and inferoapical severe hypokinesis suggestive of LAD territory ischemia/infarct, grade 1 DD, mild MR,  RV normal.  . Depression   . DVT (deep venous thrombosis) ([place]) Aug. 2016   a. Dx 04/2015 - patient returned 05/2015 after noncompliance with Xarelto.  . Environmental allergies   . Essential hypertension   . Family history of adverse reaction to anesthesia    " My Mother would get deathly sick "  . GERD (gastroesophageal reflux disease)   . Head injury 2011  . Hemorrhoids   . LBBB (left bundle branch block)   . Obesity   . Orthostatic hypotension   . Pneumonia 2012  . 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 2016   a. Dx 04/2015 - patient returned 05/2015 after noncompliance with Xarelto.  . Shortness of breath dyspnea   . Syncope    a. Recurrent syncope in 2016 felt 2/2 orthostasis in setting of PE.    PAST SURGICAL HISTORY:   Past Surgical History:  Procedure Laterality Date  . ABDOMINAL HYSTERECTOMY  1986  . ANAL FISSURE REPAIR    . ANTERIOR CERVICAL DECOMP/DISCECTOMY FUSION  01/23/2012   Procedure: ANTERIOR CERVICAL DECOMPRESSION/DISCECTOMY FUSION 1 LEVEL;  Surgeon: Elaina Hoops, MD;  Location: [facility];  Service: Neurosurgery;  Laterality: N/A;  Cervical five-six anterior cervical discectomy fusion with plating  . APPENDECTOMY    . BREAST BIOPSY Left 06/2016   US biopsy  . BREAST SURGERY Left 2011   Breast lumpectomy  . CARDIAC CATHETERIZATION  2010   [facility]; pt states it was "clear"  . CARDIAC CATHETERIZATION N/A 10/28/2015   Procedure:  Right/Left Heart Cath and Coronary Angiography;  Surgeon: Belva Crome, MD;  Location: [facility];  Service: Cardiovascular;  Laterality: N/A;  . CARPAL TUNNEL RELEASE Bilateral   . CESAREAN SECTION  1982, 1979  . CHOLECYSTECTOMY    . COLONOSCOPY  01/16/2009   Dr Bary Castilla  . KNEE CARTILAGE SURGERY Left   . SHOULDER ARTHROSCOPY WITH SUBACROMIAL DECOMPRESSION, ROTATOR CUFF REPAIR AND BICEP TENDON REPAIR Right 11/22/2013   Procedure: RIGHT SHOULDER ATHROSCOPY OPEN SUBSCAP REPAIR DELTA-PECTORAL ;  Surgeon: Augustin Schooling, MD;   Location: [facility];  Service: Orthopedics;  Laterality: Right;  . TARSAL TUNNEL RELEASE Left 1990  . TONSILLECTOMY    . TUBAL LIGATION      SOCIAL HISTORY:   Social History   Tobacco Use  . Smoking status: Never Smoker  . Smokeless tobacco: Never Used  Substance Use Topics  . Alcohol use: Yes    Comment: occasional wine    FAMILY HISTORY:   Family History  Problem Relation Age of Onset  . Breast cancer Paternal Aunt   . Breast cancer Mother 65  . Migraines Mother   . Cancer Mother   . Hypertension Mother   . Rectal cancer Father   . Cancer Father   . COPD Father   . Heart disease Paternal Grandmother   . Stroke Paternal Grandmother   . Heart attack Paternal Grandmother   . Aneurysm Paternal Grandfather   . COPD Paternal Grandfather   . Breast cancer Cousin   . Breast cancer Cousin   . Breast cancer Cousin   . Anesthesia problems Neg Hx   . Diabetes Neg Hx     DRUG ALLERGIES:   Allergies  Allergen Reactions  . Gadolinium Derivatives Shortness Of Breath and Nausea And Vomiting  . Sulfa Antibiotics Swelling    "Ears swelled up like Dumbo"  . Aripiprazole     Dry mouth with sores   . Codeine Itching and Rash  . Oxycodone Itching  . Penicillins Hives    Has patient had a PCN reaction causing immediate rash, facial/tongue/throat swelling, SOB or lightheadedness with hypotension: Yes Has patient had a PCN reaction causing severe rash involving mucus membranes or skin necrosis: No Has patient had a PCN reaction that required hospitalization No Has patient had a PCN reaction occurring within the last 10 years: No If all of the above answers are "NO", then may proceed with Cephalosporin use.    REVIEW OF SYSTEMS:   Review of Systems  Constitutional: Positive for chills, fever and malaise/fatigue. Negative for weight loss.  HENT: Positive for congestion and sore throat. Negative for ear discharge, ear pain, hearing loss, nosebleeds and tinnitus.   Eyes: Negative  for blurred vision, double vision and photophobia.  Respiratory: Positive for cough and shortness of breath. Negative for hemoptysis and wheezing.   Cardiovascular: Negative for chest pain, palpitations, orthopnea and leg swelling.  Gastrointestinal: Positive for nausea. Negative for abdominal pain, constipation, diarrhea, heartburn, melena and vomiting.  Genitourinary: Negative for dysuria, frequency and urgency.  Musculoskeletal: Negative for back pain, myalgias and neck pain.  Skin: Negative for rash.  Neurological: Negative for dizziness, tremors, sensory change, speech change, focal weakness and headaches.  Endo/Heme/Allergies: Does not bruise/bleed easily.  Psychiatric/Behavioral: Negative for depression.    MEDICATIONS AT HOME:   Prior to Admission medications   Medication Sig Start Date End Date Taking? Authorizing Provider  albuterol (PROAIR HFA) 108 (90 Base) MCG/ACT inhaler Inhale 2 puffs into the lungs every 4 (  four) hours as needed for wheezing or shortness of breath. 06/27/17  Yes Lada, Satira Anis, MD  amitriptyline (ELAVIL) 25 MG tablet Take 3 tablets (75 mg total) by mouth at bedtime. 12/10/15  Yes Kathrynn Ducking, MD  carvedilol (COREG) 6.25 MG tablet Take 1 tablet (6.25 mg total) by mouth 2 (two) times daily with a meal. 07/03/17  Yes Burtis Junes, NP  fluticasone (FLONASE) 50 MCG/ACT nasal spray Place 1 spray into both nostrils daily as needed for allergies or rhinitis. 06/26/17  Yes Lada, Satira Anis, MD  losartan (COZAAR) 25 MG tablet Take 25 mg by mouth daily.   Yes [provider]  rivaroxaban (XARELTO) 20 MG TABS tablet TAKE 1 TABLET BY MOUTH DAILY WITH SUPPER. 07/03/17  Yes Burtis Junes, NP  spironolactone (ALDACTONE) 25 MG tablet TAKE 1 TABLET BY MOUTH EVERY MONDAY, WEDNESDAY, AND FRIDAY. 03/27/17  Yes Belva Crome, MD  tiZANidine (ZANAFLEX) 2 MG tablet TAKE 1 TO 2 TABLETS BY MOUTH EVERY EIGHT HOURS AS NEEDED FOR MUSCLE SPASMS 07/31/17   Lada, Satira Anis, MD      VITAL SIGNS:  Blood pressure 120/72, pulse (!) 128, temperature 100.2 F (37.9 C), temperature source Oral, resp. rate 14, height 5\' 4"  (1.626 m), weight 107.5 kg (237 lb), SpO2 94 %.  PHYSICAL EXAMINATION:   Physical Exam  GENERAL:  62 y.o.-year-old patient lying in the bed with no acute distress.  EYES: Pupils equal, round, reactive to light and accommodation. No scleral icterus. Extraocular muscles intact.  HEENT: Head atraumatic, normocephalic. Oropharynx and nasopharynx clear. Facial flushing noted especially on the cheeks. NECK:  Supple, no jugular venous distention. No thyroid enlargement, no tenderness.  LUNGS: Normal breath sounds bilaterally, no wheezing, rales,rhonchi or crepitation. Right basilar decreased breath sounds noted. No use of accessory muscles of respiration.  CARDIOVASCULAR: S1, S2 normal. No murmurs, rubs, or gallops.  ABDOMEN: Soft, nontender, nondistended. Bowel sounds present. No organomegaly or mass.  EXTREMITIES: No pedal edema, cyanosis, or clubbing.  NEUROLOGIC: Cranial nerves II through XII are intact. Muscle strength 5/5 in all extremities. Sensation intact. Gait not checked.  PSYCHIATRIC: The patient is alert and oriented x 3.  SKIN: No obvious rash, lesion, or ulcer.   LABORATORY PANEL:   CBC Recent Labs  Lab 09/07/17 1028  WBC 14.9*  HGB 13.6  HCT 41.5  PLT 172   ------------------------------------------------------------------------------------------------------------------  Chemistries  Recent Labs  Lab 09/07/17 1028  NA 136  K 3.9  CL 99*  CO2 28  GLUCOSE 148*  BUN 10  CREATININE 0.93  CALCIUM 9.0  AST 23  ALT 12*  ALKPHOS 96  BILITOT 0.9   ------------------------------------------------------------------------------------------------------------------  Cardiac Enzymes No results for input(s): TROPONINI in the last 168  hours. ------------------------------------------------------------------------------------------------------------------  RADIOLOGY:  Dg Chest 2 View  Result Date: 09/07/2017 CLINICAL DATA:  Cough, malaise, sore throat, and fever. Found have decreased oxygen saturation with exertion. History of asthma, CHF, morbid obesity. EXAM: CHEST  2 VIEW COMPARISON:  Chest x-ray of December 22, 2015 FINDINGS: There is chronic elevation of the right hemidiaphragm. There is patchy increased interstitial density in the right mid and lower lung. The left lung is adequately inflated and clear. There is no pleural effusion. The heart and pulmonary vascularity are normal. The mediastinum is normal in width. There is mild multilevel degenerative disc disease of the thoracic spine. IMPRESSION: Subsegmental atelectasis or early pneumonia in the right mid and lower lung. No CHF. Chronic elevation of the right hemidiaphragm. Followup  PA and lateral chest X-ray is recommended in 3-4 weeks following trial of antibiotic therapy to ensure resolution and exclude underlying malignancy. Electronically Signed   By: David  Martinique M.D.   On: 09/07/2017 10:09    EKG:   Orders placed or performed during the hospital encounter of 09/07/17  . EKG 12-Lead  . EKG 12-Lead  . ED EKG 12-Lead  . ED EKG 12-Lead    IMPRESSION AND PLAN:   Patricia Fischer  is a 62 y.o. female with a known history of breast cancer status post lumpectomy, history of chronic systolic congestive heart failure with known ejection fraction of 30%, history of DVT and PE on Xarelto, presents to hospital secondary to worsening shortness of breath and cough and sore throa  1. Sepsis-secondary to community-acquired pneumonia from sick contacts -Admitted. Blood cultures have been done. -Reaction possibly to Levaquin rather than vancomycin. -Itching with penicillin in the past, hasn't used it lately. Start on Rocephin and azithromycin and monitor -Cough medications and  muscle relaxants -DuoNeb's when necessary. At rest, does not need oxygen. -Encourage ambulation and 24 hours  2. History of DVT and PE-continue Xarelto  3. Chronic systolic CHF-known ejection fraction of 30%. Does not appear to be fluid overloaded -Continue Coreg and losartan.  4. DVT prophylaxis-already on Xarelto   All the records are reviewed and case discussed with ED provider. Management plans discussed with the patient, family and they are in agreement.  CODE STATUS: Full code  TOTAL TIME TAKING CARE OF THIS PATIENT: 50 minutes.    Gladstone Lighter M.D on 09/07/2017 at 12:07 PM  Between 7am to 6pm - Pager - [phone]  After 6pm go to [email or web address] - password [email or web address] [facility]  Office  [phone]  CC: Primary care physician; Arnetha Courser, MD

## 2017-09-07 NOTE — Progress Notes (Signed)
Name: Patricia Fischer   MRN: [id number]    DOB: [birth year]   Date:09/07/2017       Progress Note  Subjective  Chief Complaint  Chief Complaint  Patient presents with  . Sore Throat    cough, sob for 3 days    HPI  PT presents with 3 days of cough, sore throat, and chest pain with cough.  She also endorses fever of 101F at home, fatigue, decreased appetite, endorses urinary frequency.  Denies palpitations, NVD, abdominal pain, urinary symptoms aside from frequency.  Patient Active Problem List   Diagnosis Date Noted  . Morbid obesity ([place]) 07/12/2017  . Allergic rhinitis 06/26/2017  . Hematuria 07/27/2016  . Medication monitoring encounter 06/08/2016  . Degenerative joint disease (DJD) of hip 03/31/2016  . Hx of breast cancer 03/22/2016  . GERD (gastroesophageal reflux disease) 10/07/2015  . HTN (hypertension) 10/07/2015  . Orthostatic hypotension 10/07/2015  . Depression, major, recurrent, moderate ([place]) 06/11/2015  . Anemia 05/15/2015  . Do not resuscitate status 05/15/2015  . Chronic systolic CHF (congestive heart failure) ([place]) 05/05/2015  . Left leg DVT ([place]) 05/05/2015  . Pulmonary hypertension ([place])   . 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 LBBB (left bundle branch block)-rate related  04/19/2015  . Chronic migraine without aura, with intractable migraine, so stated, with status migrainosus 11/04/2014  . Subscapularis (muscle) sprain 11/22/2013    Social History   Tobacco Use  . Smoking status: Never Smoker  . Smokeless tobacco: Never Used  Substance Use Topics  . Alcohol use: Yes    Comment: occasional wine     Current Outpatient Medications:  .  albuterol (PROAIR HFA) 108 (90 Base) MCG/ACT inhaler, Inhale 2 puffs into the lungs every 4 (four) hours as needed for wheezing or shortness of breath., Disp: 1 Inhaler, Rfl: 1 .  amitriptyline (ELAVIL) 25 MG tablet, Take 3 tablets (75 mg total) by mouth at bedtime., Disp: 90 tablet, Rfl: 2 .  carvedilol (COREG) 6.25  MG tablet, Take 1 tablet (6.25 mg total) by mouth 2 (two) times daily with a meal., Disp: 180 tablet, Rfl: 3 .  fluticasone (FLONASE) 50 MCG/ACT nasal spray, Place 1 spray into both nostrils daily as needed for allergies or rhinitis., Disp: 16 g, Rfl: 11 .  losartan (COZAAR) 25 MG tablet, Take 25 mg by mouth daily., Disp: , Rfl:  .  rivaroxaban (XARELTO) 20 MG TABS tablet, TAKE 1 TABLET BY MOUTH DAILY WITH SUPPER., Disp: 90 tablet, Rfl: 3 .  spironolactone (ALDACTONE) 25 MG tablet, TAKE 1 TABLET BY MOUTH EVERY MONDAY, WEDNESDAY, AND FRIDAY., Disp: 42 tablet, Rfl: 3 .  tiZANidine (ZANAFLEX) 2 MG tablet, TAKE 1 TO 2 TABLETS BY MOUTH EVERY EIGHT HOURS AS NEEDED FOR MUSCLE SPASMS, Disp: 30 tablet, Rfl: 0  Allergies  Allergen Reactions  . Gadolinium Derivatives Shortness Of Breath and Nausea And Vomiting  . Sulfa Antibiotics Swelling    "Ears swelled up like [place]"  . Aripiprazole     Dry mouth with sores   . Codeine Itching and Rash  . Oxycodone Itching  . Penicillins Hives    Has patient had a PCN reaction causing immediate rash, facial/tongue/throat swelling, SOB or lightheadedness with hypotension: Yes Has patient had a PCN reaction causing severe rash involving mucus membranes or skin necrosis: No Has patient had a PCN reaction that required hospitalization No Has patient had a PCN reaction occurring within the last 10 years: No If all of the above answers are "NO", then  may proceed with Cephalosporin use.    ROS  Ten systems reviewed and is negative except as mentioned in HPI  Objective  Vitals:   09/07/17 0850  BP: 134/86  Pulse: (!) 138  Resp: 18  Temp: (!) 101.1 F (38.4 C)  TempSrc: Oral  SpO2: 92%  Weight: 237 lb 14.4 oz (107.9 kg)  Height: '5\' 4"'  (1.626 m)   Body mass index is 40.84 kg/m.  Nursing Note and Vital Signs reviewed.  Physical Exam  Constitutional: Patient appears well-developed and well-nourished. Obese No distress.  HEENT: head atraumatic,  normocephalic, pupils equal and reactive to light, EOM's intact, TM's without erythema or bulging, no maxillary or frontal sinus pain on palpation, neck supple with bilateral submandibular lymphadenopathy, oropharynx erythematous and moist without exudate Cardiovascular: Tachycardic rate, regular rhythm, S1/S2 present.  No murmur or rub heard. No BLE edema. Pulmonary/Chest: Effort increased with ambulation, improved with rest; and breath sounds clear - no wheezes or rhonchi. Psychiatric: Patient has a normal mood and affect. behavior is normal. Judgment and thought content normal.  Recent Results (from the past 2160 hour(s))  CBC with Differential/Platelet     Status: Abnormal   Collection Time: 07/11/17  3:03 PM  Result Value Ref Range   WBC 6.5 3.8 - 10.8 Thousand/uL   RBC 4.07 3.80 - 5.10 Million/uL   Hemoglobin 11.5 (L) 11.7 - 15.5 g/dL   HCT 34.0 (L) 35.0 - 45.0 %   MCV 83.5 80.0 - 100.0 fL   MCH 28.3 27.0 - 33.0 pg   MCHC 33.8 32.0 - 36.0 g/dL   RDW 13.0 11.0 - 15.0 %   Platelets 186 140 - 400 Thousand/uL   MPV 10.8 7.5 - 12.5 fL   Neutro Abs 3,835 1,500 - 7,800 cells/uL   Lymphs Abs 2,035 850 - 3,900 cells/uL   WBC mixed population 390 200 - 950 cells/uL   Eosinophils Absolute 208 15 - 500 cells/uL   Basophils Absolute 33 0 - 200 cells/uL   Neutrophils Relative % 59 %   Total Lymphocyte 31.3 %   Monocytes Relative 6.0 %   Eosinophils Relative 3.2 %   Basophils Relative 0.5 %  COMPLETE METABOLIC PANEL WITH GFR     Status: None   Collection Time: 07/11/17  3:03 PM  Result Value Ref Range   Glucose, Bld 90 65 - 99 mg/dL    Comment: .            Fasting reference interval .    BUN 9 7 - 25 mg/dL   Creat 0.88 0.50 - 0.99 mg/dL    Comment: For patients >1 years of age, the reference limit for Creatinine is approximately 13% higher for people identified as African-American. .    GFR, Est Non African American 70 > OR = 60 mL/min/1.76m   GFR, Est African American 82 > OR  = 60 mL/min/1.765m  BUN/Creatinine Ratio NOT APPLICABLE 6 - 22 (calc)   Sodium 140 135 - 146 mmol/L   Potassium 3.7 3.5 - 5.3 mmol/L   Chloride 102 98 - 110 mmol/L   CO2 29 20 - 32 mmol/L   Calcium 9.0 8.6 - 10.4 mg/dL   Total Protein 7.0 6.1 - 8.1 g/dL   Albumin 4.0 3.6 - 5.1 g/dL   Globulin 3.0 1.9 - 3.7 g/dL (calc)   AG Ratio 1.3 1.0 - 2.5 (calc)   Total Bilirubin 0.5 0.2 - 1.2 mg/dL   Alkaline phosphatase (APISO) 69 33 - 130 U/L  AST 17 10 - 35 U/L   ALT 9 6 - 29 U/L  Lipid panel     Status: Abnormal   Collection Time: 07/11/17  3:03 PM  Result Value Ref Range   Cholesterol 172 <200 mg/dL   HDL 40 (L) >50 mg/dL   Triglycerides 161 (H) <150 mg/dL   LDL Cholesterol (Calc) 104 (H) mg/dL (calc)    Comment: Reference range: <100 . Desirable range <100 mg/dL for primary prevention;   <70 mg/dL for patients with CHD or diabetic patients  with > or = 2 CHD risk factors. Marland Kitchen LDL-C is now calculated using the Martin-Hopkins  calculation, which is a validated novel method providing  better accuracy than the Friedewald equation in the  estimation of LDL-C.  Cresenciano Genre et al. Annamaria Helling. 1610;960(45): 2061-2068  (http://education.[employer].com/faq/FAQ164)    Total CHOL/HDL Ratio 4.3 <5.0 (calc)   Non-HDL Cholesterol (Calc) 132 (H) <130 mg/dL (calc)    Comment: For patients with diabetes plus 1 major ASCVD risk  factor, treating to a non-HDL-C goal of <100 mg/dL  (LDL-C of <70 mg/dL) is considered a therapeutic  option.   TSH     Status: None   Collection Time: 07/11/17  3:03 PM  Result Value Ref Range   TSH 1.41 0.40 - 4.50 mIU/L  POCT urinalysis dipstick     Status: Abnormal   Collection Time: 08/10/17  2:13 PM  Result Value Ref Range   Color, UA slight red    Clarity, UA cloudy    Glucose, UA neg    Bilirubin, UA neg    Ketones, UA neg    Spec Grav, UA 1.015 1.010 - 1.025   Blood, UA positive    pH, UA 6.5 5.0 - 8.0   Protein, UA neg    Urobilinogen, UA 0.2 0.2 or  1.0 E.U./dL   Nitrite, UA neg    Leukocytes, UA Large (3+) (A) Negative  POCT occult blood stool     Status: Abnormal   Collection Time: 08/10/17  3:08 PM  Result Value Ref Range   Fecal Occult Blood, POC Positive (A) Negative   Card #1 Date [id number]    Card #2 Fecal Occult Blod, POC     Card #2 Date     Card #3 Fecal Occult Blood, POC     Card #3 Date    Urinalysis, Routine w reflex microscopic     Status: Abnormal   Collection Time: 08/10/17  3:24 PM  Result Value Ref Range   Color, Urine RED (A) YELLOW    Comment: BIOCHEMICALS MAY BE AFFECTED BY COLOR CORRECTED ON 12/06 AT 1634: PREVIOUSLY REPORTED AS RED    APPearance CLOUDY (A) CLEAR   Specific Gravity, Urine 1.011 1.005 - 1.030   pH 6.0 5.0 - 8.0   Glucose, UA NEGATIVE NEGATIVE mg/dL   Hgb urine dipstick LARGE (A) NEGATIVE   Bilirubin Urine NEGATIVE NEGATIVE   Ketones, ur NEGATIVE NEGATIVE mg/dL   Protein, ur 100 (A) NEGATIVE mg/dL   Nitrite NEGATIVE NEGATIVE   Leukocytes, UA LARGE (A) NEGATIVE  CBC     Status: None   Collection Time: 08/10/17  3:24 PM  Result Value Ref Range   WBC 8.3 3.6 - 11.0 K/uL   RBC 4.74 3.80 - 5.20 MIL/uL   Hemoglobin 13.4 12.0 - 16.0 g/dL   HCT 40.3 35.0 - 47.0 %   MCV 84.9 80.0 - 100.0 fL   MCH 28.2 26.0 - 34.0 pg   MCHC 33.2 32.0 -  36.0 g/dL   RDW 13.8 11.5 - 14.5 %   Platelets 195 150 - 440 K/uL  Comprehensive metabolic panel     Status: Abnormal   Collection Time: 08/10/17  3:24 PM  Result Value Ref Range   Sodium 139 135 - 145 mmol/L   Potassium 3.9 3.5 - 5.1 mmol/L   Chloride 102 101 - 111 mmol/L   CO2 27 22 - 32 mmol/L   Glucose, Bld 103 (H) 65 - 99 mg/dL   BUN 11 6 - 20 mg/dL   Creatinine, Ser 0.76 0.44 - 1.00 mg/dL   Calcium 9.4 8.9 - 10.3 mg/dL   Total Protein 7.9 6.5 - 8.1 g/dL   Albumin 4.0 3.5 - 5.0 g/dL   AST 21 15 - 41 U/L   ALT 14 14 - 54 U/L   Alkaline Phosphatase 84 38 - 126 U/L   Total Bilirubin 0.8 0.3 - 1.2 mg/dL   GFR calc non Af Amer >60 >60 mL/min    GFR calc Af Amer >60 >60 mL/min    Comment: (NOTE) The eGFR has been calculated using the CKD EPI equation. This calculation has not been validated in all clinical situations. eGFR's persistently <60 mL/min signify possible Chronic Kidney Disease.    Anion gap 10 5 - 15  Urinalysis, Microscopic (reflex)     Status: Abnormal   Collection Time: 08/10/17  3:24 PM  Result Value Ref Range   RBC / HPF TOO NUMEROUS TO COUNT 0 - 5 RBC/hpf   WBC, UA TOO NUMEROUS TO COUNT 0 - 5 WBC/hpf   Bacteria, UA NONE SEEN NONE SEEN   Squamous Epithelial / LPF 0-5 (A) NONE SEEN  Urine Culture     Status: Abnormal   Collection Time: 08/10/17  3:24 PM  Result Value Ref Range   Specimen Description URINE, RANDOM    Special Requests NONE    Culture >=100,000 COLONIES/mL ESCHERICHIA COLI (A)    Report Status 08/13/2017 FINAL    Organism ID, Bacteria ESCHERICHIA COLI (A)       Susceptibility   Escherichia coli - MIC*    AMPICILLIN 8 SENSITIVE Sensitive     CEFAZOLIN <=4 SENSITIVE Sensitive     CEFTRIAXONE <=1 SENSITIVE Sensitive     CIPROFLOXACIN <=0.25 SENSITIVE Sensitive     GENTAMICIN <=1 SENSITIVE Sensitive     IMIPENEM <=0.25 SENSITIVE Sensitive     NITROFURANTOIN <=16 SENSITIVE Sensitive     TRIMETH/SULFA <=20 SENSITIVE Sensitive     AMPICILLIN/SULBACTAM <=2 SENSITIVE Sensitive     PIP/TAZO <=4 SENSITIVE Sensitive     Extended ESBL NEGATIVE Sensitive     * >=100,000 COLONIES/mL ESCHERICHIA COLI  Urine Culture     Status: Abnormal   Collection Time: 08/10/17  4:38 PM  Result Value Ref Range   MICRO NUMBER: [id number]    SPECIMEN QUALITY: ADEQUATE    Sample Source URINE    STATUS: FINAL    ISOLATE 1: Escherichia coli (A)       Susceptibility   Escherichia coli - URINE CULTURE, REFLEX    AMOX/CLAVULANIC 4 Sensitive     AMPICILLIN 4 Sensitive     AMPICILLIN/SULBACTAM <=2 Sensitive     CEFAZOLIN* <=4 Not Reportable      * For infections other than uncomplicated UTIcaused by E. coli, K.  pneumoniae or P. mirabilis:Cefazolin is resistant if MIC > or = 8 mcg/mL.(Distinguishing susceptible versus intermediatefor isolates with MIC < or = 4 mcg/mL requiresadditional testing.)For uncomplicated UTI caused by E. coli,K.  pneumoniae or P. mirabilis: Cefazolin issusceptible if MIC <32 mcg/mL and predictssusceptible to the oral agents cefaclor, cefdinir,cefpodoxime, cefprozil, cefuroxime, cephalexinand loracarbef.    CEFEPIME <=1 Sensitive     CEFTRIAXONE <=1 Sensitive     CIPROFLOXACIN <=0.25 Sensitive     LEVOFLOXACIN 1 Sensitive     ERTAPENEM <=0.5 Sensitive     GENTAMICIN <=1 Sensitive     IMIPENEM <=0.25 Sensitive     NITROFURANTOIN <=16 Sensitive     PIP/TAZO <=4 Sensitive     TOBRAMYCIN <=1 Sensitive     TRIMETH/SULFA* <=20 Sensitive      * For infections other than uncomplicated UTIcaused by E. coli, K. pneumoniae or P. mirabilis:Cefazolin is resistant if MIC > or = 8 mcg/mL.(Distinguishing susceptible versus intermediatefor isolates with MIC < or = 4 mcg/mL requiresadditional testing.)For uncomplicated UTI caused by E. coli,K. pneumoniae or P. mirabilis: Cefazolin issusceptible if MIC <32 mcg/mL and predictssusceptible to the oral agents cefaclor, cefdinir,cefpodoxime, cefprozil, cefuroxime, cephalexinand loracarbef.Legend:S = Susceptible  I = IntermediateR = Resistant  NS = Not susceptible* = Not tested  NR = Not reported**NN = See antimicrobic comments     Assessment & Plan 1. Tachycardia 2. Shortness of breath 3. Borderline low oxygen saturation level 4. Sore throat 5. Pulmonary hypertension ([place]) 6. Chronic systolic CHF (congestive heart failure) ([facility])  Advised that due to ambulatory desaturation of 89% (up to 94% while sitting), HR of 130-148, and shortness of breath, along with patient's cardiac history, she must present for emergency care immediately. She strongly declines EMS transport despite my insistence that this is the safest mode of transportation for her. Report  is called to [facility] ER nurse first.

## 2017-09-07 NOTE — Progress Notes (Signed)
Patient usually takes coreg 6.25 mg.  Dr Tressia Miners said that she does want the patient to take 12.5 mg at this time due to the tachycardia

## 2017-09-07 NOTE — ED Provider Notes (Signed)
[facility] Emergency Department Provider Note   ____________________________________________   First MD Initiated Contact with Patient 09/07/17 1005     (approximate)  I have reviewed the triage vital signs and the nursing notes.   HISTORY  Chief Complaint Fever and Sore Throat    HPI Patricia Fischer is a 62 y.o. female consider evaluation of sore throat fever cough and shortness of breath  Patient reports for about 3-1/2 days she has been experiencing sore throat, feeling of some mild shortness of breath and a cough that does not seem to want to get better.  She saw her doctor today and was referred here for concerns.  She reports a fever this morning to as high as 101, no nausea or vomiting.  No abdominal pain.  No chest pain.  No rash.  No headache.  She does report that she feels like she has "pneumonia" and reports she was last diagnosed with that about 3 years ago.  No recent hospitalizations, resides at home.   Past Medical History:  Diagnosis Date  . Anxiety   . Asthma   . Blood transfusion [place]  . Breast cancer ([place]) 04/26/2008   left breast  . Breast cancer, left breast ([place]) 2011   DCIS  . Chronic migraine without aura, with intractable migraine, so stated, with status migrainosus March 2016  . Chronic systolic CHF (congestive heart failure) ([place]) 05/05/2015   a. Dx 04/2015 - EF initially 25-30% with RV dysfunction, f/u echo 08/2015 technically difficult, EF 35-40%, anterior, anteroseptal, apical and inferoapical severe hypokinesis suggestive of LAD territory ischemia/infarct, grade 1 DD, mild MR, RV normal.  . Depression   . DVT (deep venous thrombosis) ([place]) Aug. 2016   a. Dx 04/2015 - patient returned 05/2015 after noncompliance with Xarelto.  . Environmental allergies   . Essential hypertension   . Family history of adverse reaction to anesthesia    " My Mother would get deathly sick "  . GERD (gastroesophageal reflux  disease)   . Head injury 2011  . Hemorrhoids   . LBBB (left bundle branch block)   . Obesity   . Orthostatic hypotension   . Pneumonia 2012  . Saddle pulmonary embolus ([place]) Aug. 2016   a. Dx 04/2015 - patient returned 05/2015 after noncompliance with Xarelto.  . Shortness of breath dyspnea   . Syncope    a. Recurrent syncope in 2016 felt 2/2 orthostasis in setting of PE.    Patient Active Problem List   Diagnosis Date Noted  . Morbid obesity ([place]) 07/12/2017  . Allergic rhinitis 06/26/2017  . Hematuria 07/27/2016  . Medication monitoring encounter 06/08/2016  . Degenerative joint disease (DJD) of hip 03/31/2016  . Hx of breast cancer 03/22/2016  . GERD (gastroesophageal reflux disease) 10/07/2015  . HTN (hypertension) 10/07/2015  . Orthostatic hypotension 10/07/2015  . Depression, major, recurrent, moderate ([place]) 06/11/2015  . Anemia 05/15/2015  . Do not resuscitate status 05/15/2015  . Chronic systolic CHF (congestive heart failure) ([place]) 05/05/2015  . Left leg DVT ([place]) 05/05/2015  . Pulmonary hypertension ([place])   . Saddle pulmonary embolus ([place])   . LBBB (left bundle branch block)-rate related  04/19/2015  . Chronic migraine without aura, with intractable migraine, so stated, with status migrainosus 11/04/2014  . Subscapularis (muscle) sprain 11/22/2013    Past Surgical History:  Procedure Laterality Date  . ABDOMINAL HYSTERECTOMY  1986  . ANAL FISSURE REPAIR    . ANTERIOR CERVICAL DECOMP/DISCECTOMY FUSION  01/23/2012   Procedure: ANTERIOR CERVICAL DECOMPRESSION/DISCECTOMY FUSION 1 LEVEL;  Surgeon: Elaina Hoops, MD;  Location: [place] [facility];  Service: Neurosurgery;  Laterality: N/A;  Cervical five-six anterior cervical discectomy fusion with plating  . APPENDECTOMY    . BREAST BIOPSY Left 06/2016   US biopsy  . BREAST SURGERY Left 2011   Breast lumpectomy  . CARDIAC CATHETERIZATION  2010   [facility]; pt states it was "clear"  . CARDIAC CATHETERIZATION N/A  10/28/2015   Procedure: Right/Left Heart Cath and Coronary Angiography;  Surgeon: Belva Crome, MD;  Location: [place] [facility];  Service: Cardiovascular;  Laterality: N/A;  . CARPAL TUNNEL RELEASE Bilateral   . CESAREAN SECTION  1982, 1979  . CHOLECYSTECTOMY    . COLONOSCOPY  01/16/2009   Dr Bary Castilla  . KNEE CARTILAGE SURGERY Left   . SHOULDER ARTHROSCOPY WITH SUBACROMIAL DECOMPRESSION, ROTATOR CUFF REPAIR AND BICEP TENDON REPAIR Right 11/22/2013   Procedure: RIGHT SHOULDER ATHROSCOPY OPEN SUBSCAP REPAIR DELTA-PECTORAL ;  Surgeon: Augustin Schooling, MD;  Location: [facility];  Service: Orthopedics;  Laterality: Right;  . TARSAL TUNNEL RELEASE Left 1990  . TONSILLECTOMY    . TUBAL LIGATION      Prior to Admission medications   Medication Sig Start Date End Date Taking? Authorizing Provider  albuterol (PROAIR HFA) 108 (90 Base) MCG/ACT inhaler Inhale 2 puffs into the lungs every 4 (four) hours as needed for wheezing or shortness of breath. 06/27/17  Yes Lada, Satira Anis, MD  amitriptyline (ELAVIL) 25 MG tablet Take 3 tablets (75 mg total) by mouth at bedtime. 12/10/15  Yes Kathrynn Ducking, MD  carvedilol (COREG) 6.25 MG tablet Take 1 tablet (6.25 mg total) by mouth 2 (two) times daily with a meal. 07/03/17  Yes Burtis Junes, NP  fluticasone (FLONASE) 50 MCG/ACT nasal spray Place 1 spray into both nostrils daily as needed for allergies or rhinitis. 06/26/17  Yes Lada, Satira Anis, MD  losartan (COZAAR) 25 MG tablet Take 25 mg by mouth daily.   Yes [provider]  rivaroxaban (XARELTO) 20 MG TABS tablet TAKE 1 TABLET BY MOUTH DAILY WITH SUPPER. 07/03/17  Yes Burtis Junes, NP  spironolactone (ALDACTONE) 25 MG tablet TAKE 1 TABLET BY MOUTH EVERY MONDAY, WEDNESDAY, AND FRIDAY. 03/27/17  Yes Belva Crome, MD  tiZANidine (ZANAFLEX) 2 MG tablet TAKE 1 TO 2 TABLETS BY MOUTH EVERY EIGHT HOURS AS NEEDED FOR MUSCLE SPASMS 07/31/17   Arnetha Courser, MD  On Xarelto  Allergies Gadolinium  derivatives; Sulfa antibiotics; Aripiprazole; Codeine; Oxycodone; and Penicillins  Family History  Problem Relation Age of Onset  . Breast cancer Paternal Aunt   . Breast cancer Mother 64  . Migraines Mother   . Cancer Mother   . Hypertension Mother   . Rectal cancer Father   . Cancer Father   . COPD Father   . Heart disease Paternal Grandmother   . Stroke Paternal Grandmother   . Heart attack Paternal Grandmother   . Aneurysm Paternal Grandfather   . COPD Paternal Grandfather   . Breast cancer Cousin   . Breast cancer Cousin   . Breast cancer Cousin   . Anesthesia problems Neg Hx   . Diabetes Neg Hx     Social History Social History   Tobacco Use  . Smoking status: Never Smoker  . Smokeless tobacco: Never Used  Substance Use Topics  . Alcohol use: Yes    Comment: occasional wine  . Drug use:  No    Review of Systems Constitutional: Fevers chills and fatigue eyes: No visual changes. ENT: Sore throat, reports it feels sore but she is able to swallow.  No trouble breathing Cardiovascular: Denies chest pain. Respiratory: Shortness of breath worse with exertion.  Cough, nonproductive feels like she can get it up. Gastrointestinal: No abdominal pain.  No nausea, no vomiting.  No diarrhea.  No constipation. Genitourinary: Negative for dysuria. Musculoskeletal: Negative for back pain. Skin: Negative for rash. Neurological: Negative for headaches, focal weakness or numbness.    ____________________________________________   PHYSICAL EXAM:  VITAL SIGNS: ED Triage Vitals  Enc Vitals Group     BP 09/07/17 0925 120/72     Pulse Rate 09/07/17 0925 (!) 128     Resp 09/07/17 0925 14     Temp 09/07/17 0925 100.2 F (37.9 C)     Temp Source 09/07/17 0925 Oral     SpO2 09/07/17 0925 94 %     Weight 09/07/17 0925 237 lb (107.5 kg)     Height 09/07/17 0925 5\' 4"  (1.626 m)     Head Circumference --      Peak Flow --      Pain Score 09/07/17 0943 7     Pain Loc --       Pain Edu? --      Excl. in [place]? --     Constitutional: Alert and oriented.  Moderately ill-appearing, in no distress and very pleasant.  Eyes: Conjunctivae are normal. Head: Atraumatic. Nose: No congestion/rhinnorhea. Mouth/Throat: Mucous membranes are moist. Neck: No stridor.   Cardiovascular: Tachycardic rate, regular rhythm. Grossly normal heart sounds.  Good peripheral circulation. Respiratory: Normal respiratory effort.  No retractions.  Question some mild crackles in the right lower lung field.  No wheezing.  Speaks in full sentences. Gastrointestinal: Soft and nontender. No distention. Musculoskeletal: No lower extremity tenderness nor edema. Neurologic:  Normal speech and language. No gross focal neurologic deficits are appreciated.  Skin:  Skin is warm, dry and intact. No rash noted. Psychiatric: Mood and affect are normal. Speech and behavior are normal.  ____________________________________________   LABS (all labs ordered are listed, but only abnormal results are displayed)  Labs Reviewed  COMPREHENSIVE METABOLIC PANEL - Abnormal; Notable for the following components:      Result Value   Chloride 99 (*)    Glucose, Bld 148 (*)    ALT 12 (*)    All other components within normal limits  CBC WITH DIFFERENTIAL/PLATELET - Abnormal; Notable for the following components:   WBC 14.9 (*)    Neutro Abs 12.6 (*)    Monocytes Absolute 1.0 (*)    All other components within normal limits  CULTURE, BLOOD (ROUTINE X 2)  CULTURE, BLOOD (ROUTINE X 2)  GROUP A STREP BY PCR  LACTIC ACID, PLASMA  LACTIC ACID, PLASMA  PROTIME-INR  URINALYSIS, COMPLETE (UACMP) WITH MICROSCOPIC   ____________________________________________  EKG  Reviewed interrupt by me at 9:30 AM Heart rate 135 QRS 90 QTC 400 Sinus tachycardia, probable LVH.  No evidence of ischemia except for some mild T wave depressions 1 and aVL likely associated  LVH ____________________________________________  RADIOLOGY  Dg Chest 2 View  Result Date: 09/07/2017 CLINICAL DATA:  Cough, malaise, sore throat, and fever. Found have decreased oxygen saturation with exertion. History of asthma, CHF, morbid obesity. EXAM: CHEST  2 VIEW COMPARISON:  Chest x-ray of December 22, 2015 FINDINGS: There is chronic elevation of the right hemidiaphragm. There is patchy increased  interstitial density in the right mid and lower lung. The left lung is adequately inflated and clear. There is no pleural effusion. The heart and pulmonary vascularity are normal. The mediastinum is normal in width. There is mild multilevel degenerative disc disease of the thoracic spine. IMPRESSION: Subsegmental atelectasis or early pneumonia in the right mid and lower lung. No CHF. Chronic elevation of the right hemidiaphragm. Followup PA and lateral chest X-ray is recommended in 3-4 weeks following trial of antibiotic therapy to ensure resolution and exclude underlying malignancy. Electronically Signed   By: David  Martinique M.D.   On: 09/07/2017 10:09    Chest x-ray reviewed,  ____________________________________________   PROCEDURES  Procedure(s) performed: None  Procedures  Critical Care performed: Yes, see critical care note(s)  CRITICAL CARE Performed by: Delman Kitten   Total critical care time: 35 minutes  Critical care time was exclusive of separately billable procedures and treating other patients.  Critical care was necessary to treat or prevent imminent or life-threatening deterioration.  Critical care was time spent personally by me on the following activities: development of treatment plan with patient and/or surrogate as well as nursing, discussions with consultants, evaluation of patient's response to treatment, examination of patient, obtaining history from patient or surrogate, ordering and performing treatments and interventions, ordering and review of laboratory studies,  ordering and review of radiographic studies, pulse oximetry and re-evaluation of patient's condition.  ____________________________________________   INITIAL IMPRESSION / ASSESSMENT AND PLAN / ED COURSE  Pertinent labs & imaging results that were available during my care of the patient were reviewed by me and considered in my medical decision making (see chart for details).  Patient presents for evaluation of cough fever tachycardia.  Concerns for sepsis by her primary care doctor.  Patient does meet sepsis criteria, chest x-ray and her symptoms seem to suggest upper respiratory infection, pharyngitis, likely nonbacterial pharyngitis, but likely now developing pneumonia.  Community acquired pneumonia, treatments initiated.  Discussed with patient, will admit to the hospital for ongoing care and management.  Small volume bolus given as the patient does have a history of CHF, continue to monitor her heart rate and vital signs carefully      ____________________________________________   FINAL CLINICAL IMPRESSION(S) / ED DIAGNOSES  Final diagnoses:  Community acquired pneumonia of right lung, unspecified part of lung  Sepsis, due to unspecified organism ([place])      NEW MEDICATIONS STARTED DURING THIS VISIT:  This SmartLink is deprecated. Use AVSMEDLIST instead to display the medication list for a patient.   Note:  This document was prepared using Dragon voice recognition software and may include unintentional dictation errors.     Delman Kitten, MD 09/07/17 1135

## 2017-09-07 NOTE — Telephone Encounter (Signed)
2nd attempt to call patient in regards to cardiac rehab - lm on vm. Sending letter. °

## 2017-09-07 NOTE — Progress Notes (Addendum)
Patient says she only takes spironolactone on Mon, Wed, and Friday.  She also is requesting all of her usual meds be done at 1700.  Dr Tressia Miners notified.  Changes made.  Patient also said that tussionex is the only medicine that will help her cough when she has bronchitis

## 2017-09-08 LAB — CBC
HCT: 38.6 % (ref 35.0–47.0)
HEMOGLOBIN: 12.5 g/dL (ref 12.0–16.0)
MCH: 27.7 pg (ref 26.0–34.0)
MCHC: 32.4 g/dL (ref 32.0–36.0)
MCV: 85.5 fL (ref 80.0–100.0)
Platelets: 150 10*3/uL (ref 150–440)
RBC: 4.51 MIL/uL (ref 3.80–5.20)
RDW: 14.1 % (ref 11.5–14.5)
WBC: 11.5 10*3/uL — AB (ref 3.6–11.0)

## 2017-09-08 LAB — BASIC METABOLIC PANEL
ANION GAP: 7 (ref 5–15)
BUN: 13 mg/dL (ref 6–20)
CALCIUM: 8.7 mg/dL — AB (ref 8.9–10.3)
CHLORIDE: 99 mmol/L — AB (ref 101–111)
CO2: 30 mmol/L (ref 22–32)
CREATININE: 0.81 mg/dL (ref 0.44–1.00)
GFR calc non Af Amer: >60 mL/min (ref >60)
Glucose, Bld: 125 mg/dL — ABNORMAL HIGH (ref 65–99)
Potassium: 3.7 mmol/L (ref 3.5–5.1)
SODIUM: 136 mmol/L (ref 135–145)

## 2017-09-08 LAB — INFLUENZA PANEL BY PCR (TYPE A & B)
Influenza A By PCR: NEGATIVE
Influenza B By PCR: NEGATIVE

## 2017-09-08 MED ORDER — KETOROLAC TROMETHAMINE 30 MG/ML IJ SOLN
30.0000 mg | Freq: Once | INTRAMUSCULAR | Status: AC
  Administered 2017-09-08: 30 mg via INTRAVENOUS
  Filled 2017-09-08: qty 1

## 2017-09-08 MED ORDER — IPRATROPIUM-ALBUTEROL 0.5-2.5 (3) MG/3ML IN SOLN
3.0000 mL | Freq: Four times a day (QID) | RESPIRATORY_TRACT | Status: DC
  Administered 2017-09-08 – 2017-09-10 (×7): 3 mL via RESPIRATORY_TRACT
  Filled 2017-09-08 (×8): qty 3

## 2017-09-08 NOTE — Progress Notes (Signed)
Patient's oxygen saturation continued to decrease; warranted oxygen via nasal canula @ 2liters.  Patients saturation increased to 100%.

## 2017-09-08 NOTE — Progress Notes (Signed)
Pharmacy Antibiotic Note  Patricia Fischer is a 62 y.o. female admitted on 09/07/2017 with pneumonia.  Pharmacy has been consulted for ceftriaxone dosing.  Patient has an allergy to penicillin listed in chart. Per MD admission note, MD is aware and would like to continue with ceftriaxone and monitor patient. Spoke with RN, advised to monitor for any signs/symptoms of reaction during infusion.  Plan: Ceftriaxone 1 g IV daily  Height: 5\' 4"  (162.6 cm) Weight: 240 lb (108.9 kg) IBW/kg (Calculated) : 54.7  Temp (24hrs), Avg:98.3 F (36.8 C), Min:98 F (36.7 C), Max:98.5 F (36.9 C)  Recent Labs  Lab 09/07/17 0943 09/07/17 1028 09/07/17 1340 09/08/17 0433  WBC  --  14.9*  --  11.5*  CREATININE  --  0.93  --  0.81  LATICACIDVEN 1.9  --  1.6  --     Estimated Creatinine Clearance: 86.9 mL/min (by C-G formula based on SCr of 0.81 mg/dL).    Allergies  Allergen Reactions  . Gadolinium Derivatives Shortness Of Breath and Nausea And Vomiting  . Levaquin [Levofloxacin In D5w] Itching    Was giving with vancomycin at same time - unsure which one pt had a reaction to  . Sulfa Antibiotics Swelling    "Ears swelled up like Dumbo"  . Vancomycin Itching    Was giving at same time as Levaquin - unsure which pt had reaction to  . Aripiprazole     Dry mouth with sores   . Codeine Itching and Rash  . Oxycodone Itching  . Penicillins Hives    Has patient had a PCN reaction causing immediate rash, facial/tongue/throat swelling, SOB or lightheadedness with hypotension: Yes Has patient had a PCN reaction causing severe rash involving mucus membranes or skin necrosis: No Has patient had a PCN reaction that required hospitalization No Has patient had a PCN reaction occurring within the last 10 years: No If all of the above answers are "NO", then may proceed with Cephalosporin use.    Antimicrobials this admission: ceftriaxone 1/3 >>  azithro 1/4 >> Levofloxacin and vancomycin  1/3 in  ED  Dose adjustments this admission:  Microbiology results: 1/3 BCx: NGTD x2 1/3 Strep: Not detected  Will sign off at this time; no dose adjustments needed. Thank you for allowing pharmacy to be a part of this patient's care.  Rocky Morel, PharmD, BCPS Clinical Pharmacist 09/08/2017 11:12 AM

## 2017-09-08 NOTE — Progress Notes (Signed)
[facility] [place] NAME: Patricia Fischer    MR#:  [id number]  DATE OF BIRTH:  10/13/1955  SUBJECTIVE:  CHIEF COMPLAINT:   Chief Complaint  Patient presents with  . Fever  . Sore Throat   Came with shortness of breath and fever, noted to have a pneumonia.  History of CHF and low EF but no signs of worsening. REVIEW OF SYSTEMS:  CONSTITUTIONAL: No fever, fatigue or weakness.  EYES: No blurred or double vision.  EARS, NOSE, AND THROAT: No tinnitus or ear pain.  RESPIRATORY: Positive for cough, shortness of breath, no wheezing or hemoptysis.  CARDIOVASCULAR: No chest pain, orthopnea, edema.  GASTROINTESTINAL: No nausea, vomiting, diarrhea or abdominal pain.  GENITOURINARY: No dysuria, hematuria.  ENDOCRINE: No polyuria, nocturia,  HEMATOLOGY: No anemia, easy bruising or bleeding SKIN: No rash or lesion. MUSCULOSKELETAL: No joint pain or arthritis.   NEUROLOGIC: No tingling, numbness, weakness.  PSYCHIATRY: No anxiety or depression.   ROS  DRUG ALLERGIES:   Allergies  Allergen Reactions  . Gadolinium Derivatives Shortness Of Breath and Nausea And Vomiting  . Levaquin [Levofloxacin In D5w] Itching    Was giving with vancomycin at same time - unsure which one pt had a reaction to  . Sulfa Antibiotics Swelling    "Ears swelled up like Dumbo"  . Vancomycin Itching    Was giving at same time as Levaquin - unsure which pt had reaction to  . Aripiprazole     Dry mouth with sores   . Codeine Itching and Rash  . Oxycodone Itching  . Penicillins Hives    Has patient had a PCN reaction causing immediate rash, facial/tongue/throat swelling, SOB or lightheadedness with hypotension: Yes Has patient had a PCN reaction causing severe rash involving mucus membranes or skin necrosis: No Has patient had a PCN reaction that required hospitalization No Has patient had a PCN reaction occurring within the last 10 years: No If all of the above  answers are "NO", then may proceed with Cephalosporin use.    VITALS:  Blood pressure (!) 114/43, pulse 94, temperature 98.9 F (37.2 C), temperature source Oral, resp. rate 18, height 5\' 4"  (1.626 m), weight 108.9 kg (240 lb), SpO2 97 %.  PHYSICAL EXAMINATION:  GENERAL:  62 y.o.-year-old patient lying in the bed with no acute distress.  EYES: Pupils equal, round, reactive to light and accommodation. No scleral icterus. Extraocular muscles intact.  HEENT: Head atraumatic, normocephalic. Oropharynx and nasopharynx clear.  NECK:  Supple, no jugular venous distention. No thyroid enlargement, no tenderness.  LUNGS: Normal breath sounds bilaterally, no wheezing, bilateral crepitation. No use of accessory muscles of respiration. Requiring supplemental oxygen. CARDIOVASCULAR: S1, S2 normal. No murmurs, rubs, or gallops.  ABDOMEN: Soft, nontender, nondistended. Bowel sounds present. No organomegaly or mass.  EXTREMITIES: No pedal edema, cyanosis, or clubbing.  NEUROLOGIC: Cranial nerves II through XII are intact. Muscle strength 5/5 in all extremities. Sensation intact. Gait not checked.  PSYCHIATRIC: The patient is alert and oriented x 3.  SKIN: No obvious rash, lesion, or ulcer.   Physical Exam LABORATORY PANEL:   CBC Recent Labs  Lab 09/08/17 0433  WBC 11.5*  HGB 12.5  HCT 38.6  PLT 150   ------------------------------------------------------------------------------------------------------------------  Chemistries  Recent Labs  Lab 09/07/17 1028 09/08/17 0433  NA 136 136  K 3.9 3.7  CL 99* 99*  CO2 28 30  GLUCOSE 148* 125*  BUN 10 13  CREATININE 0.93 0.81  CALCIUM  9.0 8.7*  AST 23  --   ALT 12*  --   ALKPHOS 96  --   BILITOT 0.9  --    ------------------------------------------------------------------------------------------------------------------  Cardiac Enzymes No results for input(s): TROPONINI in the last 168  hours. ------------------------------------------------------------------------------------------------------------------  RADIOLOGY:  Dg Chest 2 View  Result Date: 09/07/2017 CLINICAL DATA:  Cough, malaise, sore throat, and fever. Found have decreased oxygen saturation with exertion. History of asthma, CHF, morbid obesity. EXAM: CHEST  2 VIEW COMPARISON:  Chest x-ray of December 22, 2015 FINDINGS: There is chronic elevation of the right hemidiaphragm. There is patchy increased interstitial density in the right mid and lower lung. The left lung is adequately inflated and clear. There is no pleural effusion. The heart and pulmonary vascularity are normal. The mediastinum is normal in width. There is mild multilevel degenerative disc disease of the thoracic spine. IMPRESSION: Subsegmental atelectasis or early pneumonia in the right mid and lower lung. No CHF. Chronic elevation of the right hemidiaphragm. Followup PA and lateral chest X-ray is recommended in 3-4 weeks following trial of antibiotic therapy to ensure resolution and exclude underlying malignancy. Electronically Signed   By: David  Martinique M.D.   On: 09/07/2017 10:09    ASSESSMENT AND PLAN:   Active Problems:   Sepsis (HCC)  Patricia Fischer  is a 62 y.o. female with a known history of breast cancer status post lumpectomy, history of chronic systolic congestive heart failure with known ejection fraction of 30%, history of DVT and PE on Xarelto, presents to hospital secondary to worsening shortness of breath and cough and sore throa  1. Sepsis-secondary to community-acquired pneumonia from sick contacts - Blood cultures have been done. Flu ordered and followed, negative. -Reaction possibly to Levaquin rather than vancomycin. - on Rocephin and azithromycin and monitor -Cough medications and muscle relaxants -DuoNeb's when necessary. At rest, does not need oxygen. - Encourage ambulation and 24 hours  2. History of DVT and PE-continue  Xarelto  3. Chronic systolic CHF-known ejection fraction of 30%. Does not appear to be fluid overloaded -Continue Coreg and losartan.  4. DVT prophylaxis-already on Xarelto    All the records are reviewed and case discussed with Care Management/Social Workerr. Management plans discussed with the patient, family and they are in agreement.  CODE STATUS: Full code.  TOTAL TIME TAKING CARE OF THIS PATIENT: 35 minutes.    POSSIBLE D/C IN 1-2 DAYS, DEPENDING ON CLINICAL CONDITION.   Vaughan Basta M.D on 09/08/2017   Between 7am to 6pm - Pager - [phone]  After 6pm go to [email or web address] - password [email or web address]  Office  [phone]  CC: Primary care physician; Arnetha Courser, MD  Note: This dictation was prepared with Dragon dictation along with smaller phrase technology. Any transcriptional errors that result from this process are unintentional.

## 2017-09-08 NOTE — Care Management Important Message (Signed)
Important Message  Patient Details  Name: Patricia Fischer MRN: [id number] Date of Birth: 1956-06-19   Medicare Important Message Given:  Yes    Beverly Sessions, RN 09/08/2017, 3:39 PM

## 2017-09-09 LAB — HIV ANTIBODY (ROUTINE TESTING W REFLEX): HIV SCREEN 4TH GENERATION: NONREACTIVE

## 2017-09-09 MED ORDER — CARVEDILOL 6.25 MG PO TABS
6.2500 mg | ORAL_TABLET | Freq: Two times a day (BID) | ORAL | Status: DC
  Administered 2017-09-09 – 2017-09-10 (×2): 6.25 mg via ORAL
  Filled 2017-09-09 (×2): qty 1

## 2017-09-09 MED ORDER — MORPHINE SULFATE (PF) 2 MG/ML IV SOLN
2.0000 mg | Freq: Once | INTRAVENOUS | Status: AC
  Administered 2017-09-09: 2 mg via INTRAVENOUS
  Filled 2017-09-09: qty 1

## 2017-09-09 MED ORDER — SUMATRIPTAN SUCCINATE 50 MG PO TABS
50.0000 mg | ORAL_TABLET | ORAL | Status: DC | PRN
  Administered 2017-09-09: 50 mg via ORAL
  Filled 2017-09-09 (×2): qty 1

## 2017-09-09 MED ORDER — BUTALBITAL-APAP-CAFFEINE 50-325-40 MG PO TABS
1.0000 | ORAL_TABLET | ORAL | Status: DC | PRN
  Administered 2017-09-09 – 2017-09-10 (×5): 1 via ORAL
  Filled 2017-09-09 (×7): qty 1

## 2017-09-09 NOTE — Progress Notes (Signed)
Dr Estanislado Pandy notified of pt's c/o HA, orders given

## 2017-09-09 NOTE — Progress Notes (Signed)
[facility] [place] NAME: Patricia Fischer    MR#:  [id number]  DATE OF BIRTH:  12/15/1955  SUBJECTIVE:  CHIEF COMPLAINT:   Chief Complaint  Patient presents with  . Fever  . Sore Throat   Came with shortness of breath and fever, noted to have a pneumonia.  History of CHF and low EF but no signs of worsening.  Still has significant cough and feels short of breath due to that. Also have headache today. REVIEW OF SYSTEMS:  CONSTITUTIONAL: No fever, fatigue or weakness.  EYES: No blurred or double vision.  EARS, NOSE, AND THROAT: No tinnitus or ear pain.  RESPIRATORY: Positive for cough, shortness of breath, no wheezing or hemoptysis.  CARDIOVASCULAR: No chest pain, orthopnea, edema.  GASTROINTESTINAL: No nausea, vomiting, diarrhea or abdominal pain.  GENITOURINARY: No dysuria, hematuria.  ENDOCRINE: No polyuria, nocturia,  HEMATOLOGY: No anemia, easy bruising or bleeding SKIN: No rash or lesion. MUSCULOSKELETAL: No joint pain or arthritis.   NEUROLOGIC: No tingling, numbness, weakness.  PSYCHIATRY: No anxiety or depression.   ROS  DRUG ALLERGIES:   Allergies  Allergen Reactions  . Gadolinium Derivatives Shortness Of Breath and Nausea And Vomiting  . Levaquin [Levofloxacin In D5w] Itching    Was giving with vancomycin at same time - unsure which one pt had a reaction to  . Sulfa Antibiotics Swelling    "Ears swelled up like Dumbo"  . Vancomycin Itching    Was giving at same time as Levaquin - unsure which pt had reaction to  . Aripiprazole     Dry mouth with sores   . Codeine Itching and Rash  . Oxycodone Itching  . Penicillins Hives    Has patient had a PCN reaction causing immediate rash, facial/tongue/throat swelling, SOB or lightheadedness with hypotension: Yes Has patient had a PCN reaction causing severe rash involving mucus membranes or skin necrosis: No Has patient had a PCN reaction that required hospitalization  No Has patient had a PCN reaction occurring within the last 10 years: No If all of the above answers are "NO", then may proceed with Cephalosporin use.    VITALS:  Blood pressure 129/63, pulse 80, temperature 98.2 F (36.8 C), temperature source Oral, resp. rate 18, height 5\' 4"  (1.626 m), weight 108.9 kg (240 lb), SpO2 95 %.  PHYSICAL EXAMINATION:  GENERAL:  62 y.o.-year-old patient lying in the bed with no acute distress.  EYES: Pupils equal, round, reactive to light and accommodation. No scleral icterus. Extraocular muscles intact.  HEENT: Head atraumatic, normocephalic. Oropharynx and nasopharynx clear.  NECK:  Supple, no jugular venous distention. No thyroid enlargement, no tenderness.  LUNGS: Normal breath sounds bilaterally, no wheezing, bilateral crepitation. No use of accessory muscles of respiration. Requiring supplemental oxygen. CARDIOVASCULAR: S1, S2 normal. No murmurs, rubs, or gallops.  ABDOMEN: Soft, nontender, nondistended. Bowel sounds present. No organomegaly or mass.  EXTREMITIES: No pedal edema, cyanosis, or clubbing.  NEUROLOGIC: Cranial nerves II through XII are intact. Muscle strength 5/5 in all extremities. Sensation intact. Gait not checked.  PSYCHIATRIC: The patient is alert and oriented x 3.  SKIN: No obvious rash, lesion, or ulcer.   Physical Exam LABORATORY PANEL:   CBC Recent Labs  Lab 09/08/17 0433  WBC 11.5*  HGB 12.5  HCT 38.6  PLT 150   ------------------------------------------------------------------------------------------------------------------  Chemistries  Recent Labs  Lab 09/07/17 1028 09/08/17 0433  NA 136 136  K 3.9 3.7  CL 99* 99*  CO2  28 30  GLUCOSE 148* 125*  BUN 10 13  CREATININE 0.93 0.81  CALCIUM 9.0 8.7*  AST 23  --   ALT 12*  --   ALKPHOS 96  --   BILITOT 0.9  --    ------------------------------------------------------------------------------------------------------------------  Cardiac Enzymes No results  for input(s): TROPONINI in the last 168 hours. ------------------------------------------------------------------------------------------------------------------  RADIOLOGY:  No results found.  ASSESSMENT AND PLAN:   Active Problems:   Sepsis ([place])  Patricia Fischer  is a 62 y.o. female with a known history of breast cancer status post lumpectomy, history of chronic systolic congestive heart failure with known ejection fraction of 30%, history of DVT and PE on Xarelto, presents to hospital secondary to worsening shortness of breath and cough and sore throa  1. Sepsis-secondary to community-acquired pneumonia from sick contacts - Blood cultures have been done. Flu ordered and followed, negative. -Reaction possibly to Levaquin rather than vancomycin. - on Rocephin and azithromycin and monitor -Cough medications and muscle relaxants -DuoNeb's when necessary. At rest, does not need oxygen. - Encourage ambulation and 24 hours - Incentive spirometer. Still on oxygen today.  2. History of DVT and PE-continue Xarelto  3. Chronic systolic CHF-known ejection fraction of 30%. Does not appear to be fluid overloaded -Continue Coreg and losartan.  4. DVT prophylaxis-already on Xarelto  5. Headache   Give Imitrex and fioricet.  All the records are reviewed and case discussed with Care Management/Social Workerr. Management plans discussed with the patient, family and they are in agreement.  CODE STATUS: Full code.  TOTAL TIME TAKING CARE OF THIS PATIENT: 35 minutes.    POSSIBLE D/C IN 1-2 DAYS, DEPENDING ON CLINICAL CONDITION.   Vaughan Basta M.D on 09/09/2017   Between 7am to 6pm - Pager - [phone]  After 6pm go to [email or web address] - password [email or web address] [facility]  Office  [phone]  CC: Primary care physician; Arnetha Courser, MD  Note: This dictation was prepared with Dragon dictation along with smaller phrase technology. Any transcriptional  errors that result from this process are unintentional.

## 2017-09-10 MED ORDER — AZITHROMYCIN 500 MG PO TABS: 500.0000 mg | ORAL_TABLET | Freq: Every day | ORAL | 0 refills | Status: AC

## 2017-09-10 MED ORDER — CEFUROXIME AXETIL 250 MG PO TABS: 250.0000 mg | ORAL_TABLET | Freq: Two times a day (BID) | ORAL | 0 refills | Status: AC

## 2017-09-10 MED ORDER — HYDROCOD POLST-CPM POLST ER 10-8 MG/5ML PO SUER: 5.0000 mL | Freq: Two times a day (BID) | ORAL | 0 refills | Status: DC

## 2017-09-10 NOTE — Progress Notes (Signed)
09/10/2017 .13:40  [name] to be D/C'd Home per MD order.  Discussed prescriptions and follow up appointments with the patient. Prescriptions given to patient, medication list explained in detail. Pt verbalized understanding.  Allergies as of 09/10/2017      Reactions   Gadolinium Derivatives Shortness Of Breath, Nausea And Vomiting   Levaquin [levofloxacin In D5w] Itching   Was giving with vancomycin at same time - unsure which one pt had a reaction to   Sulfa Antibiotics Swelling   "Ears swelled up like Dumbo"   Vancomycin Itching   Was giving at same time as Levaquin - unsure which pt had reaction to   Aripiprazole    Dry mouth with sores   Codeine Itching, Rash   Oxycodone Itching   Penicillins Hives   Has patient had a PCN reaction causing immediate rash, facial/tongue/throat swelling, SOB or lightheadedness with hypotension: Yes Has patient had a PCN reaction causing severe rash involving mucus membranes or skin necrosis: No Has patient had a PCN reaction that required hospitalization No Has patient had a PCN reaction occurring within the last 10 years: No If all of the above answers are "NO", then may proceed with Cephalosporin use.      Medication List    TAKE these medications   albuterol 108 (90 Base) MCG/ACT inhaler Commonly known as:  PROAIR HFA Inhale 2 puffs into the lungs every 4 (four) hours as needed for wheezing or shortness of breath.   amitriptyline 25 MG tablet Commonly known as:  ELAVIL Take 3 tablets (75 mg total) by mouth at bedtime.   azithromycin 500 MG tablet Commonly known as:  ZITHROMAX Take 1 tablet (500 mg total) by mouth daily for 3 days. Take 1 tablet daily for 3 days.   carvedilol 6.25 MG tablet Commonly known as:  COREG Take 1 tablet (6.25 mg total) by mouth 2 (two) times daily with a meal.   cefUROXime 250 MG tablet Commonly known as:  CEFTIN Take 1 tablet (250 mg total) by mouth 2 (two) times daily for 3 days. Pt tolerated  ceftriaxone IV without any difficulty in hospital.   chlorpheniramine-HYDROcodone 10-8 MG/5ML Suer Commonly known as:  TUSSIONEX Take 5 mLs by mouth every 12 (twelve) hours.   fluticasone 50 MCG/ACT nasal spray Commonly known as:  FLONASE Place 1 spray into both nostrils daily as needed for allergies or rhinitis.   losartan 25 MG tablet Commonly known as:  COZAAR Take 25 mg by mouth daily.   rivaroxaban 20 MG Tabs tablet Commonly known as:  XARELTO TAKE 1 TABLET BY MOUTH DAILY WITH SUPPER.   spironolactone 25 MG tablet Commonly known as:  ALDACTONE TAKE 1 TABLET BY MOUTH EVERY MONDAY, WEDNESDAY, AND FRIDAY.   tiZANidine 2 MG tablet Commonly known as:  ZANAFLEX TAKE 1 TO 2 TABLETS BY MOUTH EVERY EIGHT HOURS AS NEEDED FOR MUSCLE SPASMS       Vitals:   09/10/17 1107 09/10/17 1224  BP:  (!) 100/52  Pulse: (!) 101 71  Resp:  19  Temp:  98.1 F (36.7 C)  SpO2: 96% 91%    Skin clean, dry and intact without evidence of skin break down, no evidence of skin tears noted. IV catheter discontinued intact. Site without signs and symptoms of complications. Dressing and pressure applied. Pt denies pain at this time. No complaints noted.  An After Visit Summary was printed and given to the patient. Patient escorted via [place], and D/C home via private auto.  Dola Argyle

## 2017-09-10 NOTE — Care Management Note (Signed)
Case Management Note  Patient Details  Name: Patricia Fischer MRN: [id number] Date of Birth: 1955-12-14  Subjective/Objective:     Already on Xaralto when admitted. No Xaralto coupon given.                Action/Plan:   Expected Discharge Date:  09/10/17               Expected Discharge Plan:     In-House Referral:     Discharge planning Services     Post Acute Care Choice:    Choice offered to:     DME Arranged:    DME Agency:     HH Arranged:    HH Agency:     Status of Service:     If discussed at [employer], dates discussed:    Additional Comments:  Aneisha Skyles A, RN 09/10/2017, 11:36 AM

## 2017-09-10 NOTE — Discharge Summary (Signed)
[facility] [place] NAME: Patricia Fischer    MR#:  [id number]  DATE OF BIRTH:  30-Aug-1956  DATE OF ADMISSION:  09/07/2017 ADMITTING PHYSICIAN: Gladstone Lighter, MD  DATE OF DISCHARGE: 09/10/2017  PRIMARY CARE PHYSICIAN: Arnetha Courser, MD    ADMISSION DIAGNOSIS:  Sepsis, due to unspecified organism ([place]) [A41.9] Community acquired pneumonia of right lung, unspecified part of lung [J18.9]  DISCHARGE DIAGNOSIS:  Active Problems:   Sepsis ([place])    Pneumonia.  SECONDARY DIAGNOSIS:   Past Medical History:  Diagnosis Date  . Anxiety   . Asthma   . Blood transfusion [place]  . Breast cancer ([place]) 04/26/2008   left breast  . Breast cancer, left breast ([place]) 2011   DCIS  . Chronic migraine without aura, with intractable migraine, so stated, with status migrainosus March 2016  . Chronic systolic CHF (congestive heart failure) ([place]) 05/05/2015   a. Dx 04/2015 - EF initially 25-30% with RV dysfunction, f/u echo 08/2015 technically difficult, EF 35-40%, anterior, anteroseptal, apical and inferoapical severe hypokinesis suggestive of LAD territory ischemia/infarct, grade 1 DD, mild MR, RV normal.  . Depression   . DVT (deep venous thrombosis) ([place]) Aug. 2016   a. Dx 04/2015 - patient returned 05/2015 after noncompliance with Xarelto.  . Environmental allergies   . Essential hypertension   . Family history of adverse reaction to anesthesia    " My Mother would get deathly sick "  . GERD (gastroesophageal reflux disease)   . Head injury 2011  . Hemorrhoids   . LBBB (left bundle branch block)   . Obesity   . Orthostatic hypotension   . Pneumonia 2012  . Saddle pulmonary embolus ([place]) Aug. 2016   a. Dx 04/2015 - patient returned 05/2015 after noncompliance with Xarelto.  . Shortness of breath dyspnea   . Syncope    a. Recurrent syncope in 2016 felt 2/2 orthostasis in setting of PE.    HOSPITAL COURSE:    LouanneThompsonis a61 y.o.femalewith a known history of breast cancer status post lumpectomy, history of chronic systolic congestive heart failure with known ejection fraction of 30%, history of DVT and PE on Xarelto, presents to hospital secondary to worsening shortness of breath and cough and sore throa  1. Sepsis-secondary to community-acquired pneumonia from sick contacts - Blood cultures have been done. Flu ordered and followed, negative. -Reaction possibly to Levaquin rather than vancomycin. - on Rocephin and azithromycin and monitor -Cough medications and muscle relaxants -DuoNeb's when necessary.At rest,does not need oxygen. - Encourage ambulation and 24 hours - Incentive spirometer.  - Off oxygen, improved, ambulate fine.  2. History of DVT and PE-continue Xarelto  3. Chronic systolic CHF-known ejection fraction of 30%. Does not appear to be fluid overloaded -Continue Coreg and losartan.  4. DVT prophylaxis-already on Xarelto  5. Headache   Give Imitrex and fioricet.    DISCHARGE CONDITIONS:   Stable.  CONSULTS OBTAINED:    DRUG ALLERGIES:   Allergies  Allergen Reactions  . Gadolinium Derivatives Shortness Of Breath and Nausea And Vomiting  . Levaquin [Levofloxacin In D5w] Itching    Was giving with vancomycin at same time - unsure which one pt had a reaction to  . Sulfa Antibiotics Swelling    "Ears swelled up like Dumbo"  . Vancomycin Itching    Was giving at same time as Levaquin - unsure which pt had reaction to  . Aripiprazole     Dry  mouth with sores   . Codeine Itching and Rash  . Oxycodone Itching  . Penicillins Hives    Has patient had a PCN reaction causing immediate rash, facial/tongue/throat swelling, SOB or lightheadedness with hypotension: Yes Has patient had a PCN reaction causing severe rash involving mucus membranes or skin necrosis: No Has patient had a PCN reaction that required hospitalization No Has patient had a PCN  reaction occurring within the last 10 years: No If all of the above answers are "NO", then may proceed with Cephalosporin use.    DISCHARGE MEDICATIONS:   Allergies as of 09/10/2017      Reactions   Gadolinium Derivatives Shortness Of Breath, Nausea And Vomiting   Levaquin [levofloxacin In D5w] Itching   Was giving with vancomycin at same time - unsure which one pt had a reaction to   Sulfa Antibiotics Swelling   "Ears swelled up like Dumbo"   Vancomycin Itching   Was giving at same time as Levaquin - unsure which pt had reaction to   Aripiprazole    Dry mouth with sores   Codeine Itching, Rash   Oxycodone Itching   Penicillins Hives   Has patient had a PCN reaction causing immediate rash, facial/tongue/throat swelling, SOB or lightheadedness with hypotension: Yes Has patient had a PCN reaction causing severe rash involving mucus membranes or skin necrosis: No Has patient had a PCN reaction that required hospitalization No Has patient had a PCN reaction occurring within the last 10 years: No If all of the above answers are "NO", then may proceed with Cephalosporin use.      Medication List    TAKE these medications   albuterol 108 (90 Base) MCG/ACT inhaler Commonly known as:  PROAIR HFA Inhale 2 puffs into the lungs every 4 (four) hours as needed for wheezing or shortness of breath.   amitriptyline 25 MG tablet Commonly known as:  ELAVIL Take 3 tablets (75 mg total) by mouth at bedtime.   azithromycin 500 MG tablet Commonly known as:  ZITHROMAX Take 1 tablet (500 mg total) by mouth daily for 3 days. Take 1 tablet daily for 3 days.   carvedilol 6.25 MG tablet Commonly known as:  COREG Take 1 tablet (6.25 mg total) by mouth 2 (two) times daily with a meal.   cefUROXime 250 MG tablet Commonly known as:  CEFTIN Take 1 tablet (250 mg total) by mouth 2 (two) times daily for 3 days. Pt tolerated ceftriaxone IV without any difficulty in hospital.   chlorpheniramine-HYDROcodone  10-8 MG/5ML Suer Commonly known as:  TUSSIONEX Take 5 mLs by mouth every 12 (twelve) hours.   fluticasone 50 MCG/ACT nasal spray Commonly known as:  FLONASE Place 1 spray into both nostrils daily as needed for allergies or rhinitis.   losartan 25 MG tablet Commonly known as:  COZAAR Take 25 mg by mouth daily.   rivaroxaban 20 MG Tabs tablet Commonly known as:  XARELTO TAKE 1 TABLET BY MOUTH DAILY WITH SUPPER.   spironolactone 25 MG tablet Commonly known as:  ALDACTONE TAKE 1 TABLET BY MOUTH EVERY MONDAY, WEDNESDAY, AND FRIDAY.   tiZANidine 2 MG tablet Commonly known as:  ZANAFLEX TAKE 1 TO 2 TABLETS BY MOUTH EVERY EIGHT HOURS AS NEEDED FOR MUSCLE SPASMS        DISCHARGE INSTRUCTIONS:    Follow with PMD in 1-2 weeks.  If you experience worsening of your admission symptoms, develop shortness of breath, life threatening emergency, suicidal or homicidal thoughts you must seek medical  attention immediately by calling 911 or calling your MD immediately  if symptoms less severe.  You Must read complete instructions/literature along with all the possible adverse reactions/side effects for all the Medicines you take and that have been prescribed to you. Take any new Medicines after you have completely understood and accept all the possible adverse reactions/side effects.   Please note  You were cared for by a hospitalist during your hospital stay. If you have any questions about your discharge medications or the care you received while you were in the hospital after you are discharged, you can call the unit and asked to speak with the hospitalist on call if the hospitalist that took care of you is not available. Once you are discharged, your primary care physician will handle any further medical issues. Please note that NO REFILLS for any discharge medications will be authorized once you are discharged, as it is imperative that you return to your primary care physician (or establish a  relationship with a primary care physician if you do not have one) for your aftercare needs so that they can reassess your need for medications and monitor your lab values.    Today   CHIEF COMPLAINT:   Chief Complaint  Patient presents with  . Fever  . Sore Throat    HISTORY OF PRESENT ILLNESS:  Patricia Fischer  is a 62 y.o. female with a known history of breast cancer status post lumpectomy, history of chronic systolic congestive heart failure with known ejection fraction of 30%, history of DVT and PE on Xarelto, presents to hospital secondary to worsening shortness of breath and cough and sore throat. Patient has been exposed to her grandkids who had strep throat couple of days ago. She has been feeling shortness of breath, dry cough for the last 2 days. Fevers and chills at home. Went to see her primary care physician today and was noted to be tachycardic with heart rate in 140s and hypoxic. No history of COPD or asthma. Not on any home oxygen. Chest x-ray here shows right mid lung possible pneumonia. Elevated WBC noted and tachycardia. Being admitted for sepsis secondary to pneumonia. Receiving Levaquin in the emergency room and developing a rash and itching with that.   VITAL SIGNS:  Blood pressure (!) 100/52, pulse 71, temperature 98.1 F (36.7 C), temperature source Oral, resp. rate 19, height 5\' 4"  (1.626 m), weight 108.9 kg (240 lb), SpO2 91 %.  I/O:    Intake/Output Summary (Last 24 hours) at 09/10/2017 1358 Last data filed at 09/10/2017 1035 Gross per 24 hour  Intake 411 ml  Output 1100 ml  Net -689 ml    PHYSICAL EXAMINATION:   GENERAL:  62 y.o.-year-old patient lying in the bed with no acute distress.  EYES: Pupils equal, round, reactive to light and accommodation. No scleral icterus. Extraocular muscles intact.  HEENT: Head atraumatic, normocephalic. Oropharynx and nasopharynx clear.  NECK:  Supple, no jugular venous distention. No thyroid enlargement, no  tenderness.  LUNGS: Normal breath sounds bilaterally, no wheezing, bilateral crepitation. No use of accessory muscles of respiration. Requiring supplemental oxygen. CARDIOVASCULAR: S1, S2 normal. No murmurs, rubs, or gallops.  ABDOMEN: Soft, nontender, nondistended. Bowel sounds present. No organomegaly or mass.  EXTREMITIES: No pedal edema, cyanosis, or clubbing.  NEUROLOGIC: Cranial nerves II through XII are intact. Muscle strength 5/5 in all extremities. Sensation intact. Gait not checked.  PSYCHIATRIC: The patient is alert and oriented x 3.  SKIN: No obvious rash, lesion, or ulcer.  DATA REVIEW:   CBC Recent Labs  Lab 09/08/17 0433  WBC 11.5*  HGB 12.5  HCT 38.6  PLT 150    Chemistries  Recent Labs  Lab 09/07/17 1028 09/08/17 0433  NA 136 136  K 3.9 3.7  CL 99* 99*  CO2 28 30  GLUCOSE 148* 125*  BUN 10 13  CREATININE 0.93 0.81  CALCIUM 9.0 8.7*  AST 23  --   ALT 12*  --   ALKPHOS 96  --   BILITOT 0.9  --     Cardiac Enzymes No results for input(s): TROPONINI in the last 168 hours.  Microbiology Results  Results for orders placed or performed during the hospital encounter of 09/07/17  Culture, blood (Routine x 2)     Status: None (Preliminary result)   Collection Time: 09/07/17 10:28 AM  Result Value Ref Range Status   Specimen Description BLOOD BLOOD RIGHT FOREARM  Final   Special Requests   Final    BOTTLES DRAWN AEROBIC AND ANAEROBIC Blood Culture results may not be optimal due to an excessive volume of blood received in culture bottles   Culture   Final    NO GROWTH 3 DAYS Performed at [facility], [address], [place], [place] [address]    Report Status PENDING  Incomplete  Culture, blood (Routine x 2)     Status: None (Preliminary result)   Collection Time: 09/07/17 10:28 AM  Result Value Ref Range Status   Specimen Description BLOOD RIGHT ANTECUBITAL  Final   Special Requests   Final    BOTTLES DRAWN AEROBIC AND ANAEROBIC Blood  Culture adequate volume   Culture   Final    NO GROWTH 3 DAYS Performed at [facility], [address] [place], [place] [address]    Report Status PENDING  Incomplete  Group A Strep by PCR ([place] Only)     Status: None   Collection Time: 09/07/17 10:28 AM  Result Value Ref Range Status   Group A Strep by PCR NOT DETECTED NOT DETECTED Final    Comment: Performed at [facility], [address] [place], [place] [address]    RADIOLOGY:  No results found.  EKG:   Orders placed or performed during the hospital encounter of 09/07/17  . EKG 12-Lead  . EKG 12-Lead  . ED EKG 12-Lead  . ED EKG 12-Lead      Management plans discussed with the patient, family and they are in agreement.  CODE STATUS:     Code Status Orders  (From admission, onward)        Start     Ordered   09/07/17 1340  Full code  Continuous     09/07/17 1340    Code Status History    Date Active Date Inactive Code Status Order ID Comments User Context   10/27/2015 18:54 10/31/2015 21:12 Partial Code [id number]  Robet Leu Inpatient   10/27/2015 18:45 10/27/2015 18:53 DNR [id number]  Charlie Pitter, PA-C Inpatient   10/07/2015 03:25 10/07/2015 21:16 DNR [id number]  Lance Coon, MD Inpatient   05/04/2015 19:55 05/09/2015 17:51 DNR [id number]  Toy Baker, MD Inpatient   04/19/2015 12:24 04/29/2015 23:36 Full Code [id number]  Erlene Quan, PA-C ED   11/22/2013 20:07 11/23/2013 17:42 Full Code [id number]  Augustin Schooling, MD Inpatient      TOTAL TIME TAKING CARE OF THIS PATIENT: 35 minutes.    Vaughan Basta M.D on 09/10/2017 at 1:58 PM  Between 7am to 6pm - Pager - [phone]  After 6pm go to [email or web address] - password [email or web address] [facility]  Office  [phone]  CC: Primary care physician; Arnetha Courser, MD   Note: This dictation was prepared with Dragon dictation along with smaller phrase technology. Any transcriptional errors that result from  this process are unintentional.

## 2017-09-12 LAB — CULTURE, BLOOD (ROUTINE X 2)
Culture: NO GROWTH
Culture: NO GROWTH
Special Requests: ADEQUATE

## 2017-09-15 ENCOUNTER — Telehealth (HOSPITAL_COMMUNITY): Payer: Self-pay

## 2017-09-15 NOTE — Telephone Encounter (Signed)
Called and spoke with patient in regards to cardiac rehab - patient is not interested in the program. Closed referral.

## 2017-09-18 ENCOUNTER — Inpatient Hospital Stay: Payer: Medicare Other | Admitting: Family Medicine

## 2017-09-19 NOTE — Assessment & Plan Note (Signed)
Well-controlled; continue current medicine

## 2017-09-21 ENCOUNTER — Encounter: Payer: Self-pay | Admitting: Family Medicine

## 2017-09-21 ENCOUNTER — Ambulatory Visit (INDEPENDENT_AMBULATORY_CARE_PROVIDER_SITE_OTHER): Payer: Medicare Other | Admitting: Family Medicine

## 2017-09-21 VITALS — BP 118/70 | HR 105 | Temp 98.6°F | Resp 14 | Wt 231.5 lb

## 2017-09-21 DIAGNOSIS — J181 Lobar pneumonia, unspecified organism: Secondary | ICD-10-CM | POA: Diagnosis not present

## 2017-09-21 DIAGNOSIS — N898 Other specified noninflammatory disorders of vagina: Secondary | ICD-10-CM

## 2017-09-21 DIAGNOSIS — T3695XA Adverse effect of unspecified systemic antibiotic, initial encounter: Secondary | ICD-10-CM

## 2017-09-21 DIAGNOSIS — R300 Dysuria: Secondary | ICD-10-CM

## 2017-09-21 DIAGNOSIS — K521 Toxic gastroenteritis and colitis: Secondary | ICD-10-CM | POA: Diagnosis not present

## 2017-09-21 DIAGNOSIS — N9089 Other specified noninflammatory disorders of vulva and perineum: Secondary | ICD-10-CM

## 2017-09-21 DIAGNOSIS — J189 Pneumonia, unspecified organism: Secondary | ICD-10-CM

## 2017-09-21 MED ORDER — NYSTATIN 100000 UNIT/GM EX CREA: 1.0000 "application " | TOPICAL_CREAM | Freq: Two times a day (BID) | CUTANEOUS | 0 refills | Status: DC

## 2017-09-21 NOTE — Progress Notes (Signed)
BP 118/70   Pulse (!) 105   Temp 98.6 F (37 C) (Oral)   Resp 14   Wt 231 lb 8 oz (105 kg)   SpO2 98%   BMI 39.74 kg/m    Subjective:    Patient ID: Patricia Fischer, female    DOB: 09-Dec-1955, 62 y.o.   MRN: [id number]  HPI: Patricia Fischer is a 62 y.o. female  Chief Complaint  Patient presents with  . Hospitalization Follow-up    sepsis  . Diarrhea    after she eats  . Urinary Tract Infection    burning with urination and itching (just use the bathroom when she came in)  . Vaginitis    itching  and reddness    HPI Patient is here for hospital follow-up She was given levaquin and vancomycin; had itching on the head and ears and arms; allergic Took both at the same time, but not sure which one she is allergic to Developed diarrhea; every time she eats has stools that have turned green Also thinks she has yeast infection outside and inside; has not tried anything She has been inside, not going to the stores May have a UTI, burning with urination; unable to give specimen, has to go frequently May have fissure to the side of the vagina; just since this has been going on  Chest xray Sep 07, 2017: FINDINGS: There is chronic elevation of the right hemidiaphragm. There is patchy increased interstitial density in the right mid and lower lung. The left lung is adequately inflated and clear. There is no pleural effusion. The heart and pulmonary vascularity are normal. The mediastinum is normal in width. There is mild multilevel degenerative disc disease of the thoracic spine.  IMPRESSION: Subsegmental atelectasis or early pneumonia in the right mid and lower lung. No CHF. Chronic elevation of the right hemidiaphragm. Followup PA and lateral chest X-ray is recommended in 3-4 weeks following trial of antibiotic therapy to ensure resolution and exclude underlying malignancy.   Electronically Signed   By: David  Martinique M.D.   On: 09/07/2017 10:09  Depression  screen [facility] 2/9 07/11/2017 06/26/2017 08/04/2016 07/27/2016 03/22/2016  Decreased Interest 0 0 0 0 0  Down, Depressed, Hopeless 0 0 0 0 0  PHQ - 2 Score 0 0 0 0 0  Altered sleeping - - - - -  Tired, decreased energy - - - - -  Change in appetite - - - - -  Feeling bad or failure about yourself  - - - - -  Trouble concentrating - - - - -  Moving slowly or fidgety/restless - - - - -  Suicidal thoughts - - - - -  PHQ-9 Score - - - - -    Relevant past medical, surgical, family and social history reviewed Past Medical History:  Diagnosis Date  . Anxiety   . Asthma   . Blood transfusion [place]  . Breast cancer ([place]) 04/26/2008   left breast  . Breast cancer, left breast ([place]) 2011   DCIS  . Chronic migraine without aura, with intractable migraine, so stated, with status migrainosus March 2016  . Chronic systolic CHF (congestive heart failure) ([place]) 05/05/2015   a. Dx 04/2015 - EF initially 25-30% with RV dysfunction, f/u echo 08/2015 technically difficult, EF 35-40%, anterior, anteroseptal, apical and inferoapical severe hypokinesis suggestive of LAD territory ischemia/infarct, grade 1 DD, mild MR, RV normal.  . Depression   . DVT (deep venous thrombosis) ([place])  Aug. 2016   a. Dx 04/2015 - patient returned 05/2015 after noncompliance with Xarelto.  . Environmental allergies   . Essential hypertension   . Family history of adverse reaction to anesthesia    " My Mother would get deathly sick "  . GERD (gastroesophageal reflux disease)   . Head injury 2011  . Hemorrhoids   . LBBB (left bundle branch block)   . Obesity   . Orthostatic hypotension   . Pneumonia 2012  . Saddle pulmonary embolus ([place]) Aug. 2016   a. Dx 04/2015 - patient returned 05/2015 after noncompliance with Xarelto.  . Shortness of breath dyspnea   . Syncope    a. Recurrent syncope in 2016 felt 2/2 orthostasis in setting of PE.   Past Surgical History:  Procedure Laterality Date  . ABDOMINAL HYSTERECTOMY   1986  . ANAL FISSURE REPAIR    . ANTERIOR CERVICAL DECOMP/DISCECTOMY FUSION  01/23/2012   Procedure: ANTERIOR CERVICAL DECOMPRESSION/DISCECTOMY FUSION 1 LEVEL;  Surgeon: Elaina Hoops, MD;  Location: [facility];  Service: Neurosurgery;  Laterality: N/A;  Cervical five-six anterior cervical discectomy fusion with plating  . APPENDECTOMY    . BREAST BIOPSY Left 06/2016   US biopsy  . BREAST SURGERY Left 2011   Breast lumpectomy  . CARDIAC CATHETERIZATION  2010   [facility]; pt states it was "clear"  . CARDIAC CATHETERIZATION N/A 10/28/2015   Procedure: Right/Left Heart Cath and Coronary Angiography;  Surgeon: Belva Crome, MD;  Location: [facility];  Service: Cardiovascular;  Laterality: N/A;  . CARPAL TUNNEL RELEASE Bilateral   . CESAREAN SECTION  1982, 1979  . CHOLECYSTECTOMY    . COLONOSCOPY  01/16/2009   Dr Bary Castilla  . KNEE CARTILAGE SURGERY Left   . SHOULDER ARTHROSCOPY WITH SUBACROMIAL DECOMPRESSION, ROTATOR CUFF REPAIR AND BICEP TENDON REPAIR Right 11/22/2013   Procedure: RIGHT SHOULDER ATHROSCOPY OPEN SUBSCAP REPAIR DELTA-PECTORAL ;  Surgeon: Augustin Schooling, MD;  Location: [facility];  Service: Orthopedics;  Laterality: Right;  . TARSAL TUNNEL RELEASE Left 1990  . TONSILLECTOMY    . TUBAL LIGATION     Family History  Problem Relation Age of Onset  . Breast cancer Paternal Aunt   . Breast cancer Mother 84  . Migraines Mother   . Cancer Mother   . Hypertension Mother   . Rectal cancer Father   . Cancer Father   . COPD Father   . Heart disease Paternal Grandmother   . Stroke Paternal Grandmother   . Heart attack Paternal Grandmother   . Aneurysm Paternal Grandfather   . COPD Paternal Grandfather   . Breast cancer Cousin   . Breast cancer Cousin   . Breast cancer Cousin   . Anesthesia problems Neg Hx   . Diabetes Neg Hx    Social History   Tobacco Use  . Smoking status: Never Smoker  . Smokeless tobacco: Never Used  Substance Use Topics  . Alcohol use: Yes     Comment: occasional wine  . Drug use: No    Interim medical history since last visit reviewed. Allergies and medications reviewed  Review of Systems Per HPI unless specifically indicated above     Objective:    BP 118/70   Pulse (!) 105   Temp 98.6 F (37 C) (Oral)   Resp 14   Wt 231 lb 8 oz (105 kg)   SpO2 98%   BMI 39.74 kg/m   Wt Readings from Last 3 Encounters:  09/21/17  231 lb 8 oz (105 kg)  09/07/17 240 lb (108.9 kg)  09/07/17 237 lb 14.4 oz (107.9 kg)    Physical Exam  Constitutional: She appears well-developed and well-nourished. No distress.  HENT:  Head: Normocephalic and atraumatic.  Eyes: EOM are normal. No scleral icterus.  Neck: No thyromegaly present.  Cardiovascular: Normal rate, regular rhythm and normal heart sounds.  No murmur heard. Pulmonary/Chest: Effort normal and breath sounds normal. No respiratory distress. She has no wheezes.  Abdominal: Soft. Bowel sounds are normal. She exhibits no distension.  Genitourinary: There is rash on the right labia. There is rash on the left labia. Vaginal discharge (thin) found.  Genitourinary Comments: Erythematous; fissure on the LEFT in the perineum  Musculoskeletal: Normal range of motion. She exhibits no edema.  Neurological: She is alert. She exhibits normal muscle tone.  Skin: Skin is warm and dry. She is not diaphoretic. No pallor.  Psychiatric: She has a normal mood and affect. Her behavior is normal. Judgment and thought content normal.      Assessment & Plan:   Problem List Items Addressed This Visit    None    Visit Diagnoses    Pneumonia of right middle lobe due to infectious organism ([place])    -  Primary   Relevant Medications   nystatin cream (MYCOSTATIN)   Other Relevant Orders   DG Chest 2 View   Antibiotic-associated diarrhea       discussed risk of C diff; will get stool test for toxin   Relevant Orders   C. difficile GDH and Toxin A/B   Vaginal discharge       wet mount    Relevant Orders   WET PREP BY MOLECULAR PROBE (Completed)   Perineal fissure in female       recommended sitz baths   Burning with urination       Relevant Orders   Urine Culture       Follow up plan: No Follow-up on file.  An after-visit summary was printed and given to the patient at [place].  Please see the patient instructions which may contain other information and recommendations beyond what is mentioned above in the assessment and plan.  Meds ordered this encounter  Medications  . nystatin cream (MYCOSTATIN)    Sig: Apply 1 application topically 2 (two) times daily.    Dispense:  30 g    Refill:  0    Orders Placed This Encounter  Procedures  . WET PREP BY MOLECULAR PROBE  . Urine Culture  . Urine Culture  . DG Chest 2 View  . C. difficile GDH and Toxin A/B

## 2017-09-21 NOTE — Patient Instructions (Addendum)
Use the cream down below on the outside Do sitz baths to help with the fissure We'll see what the urine and stool studies show Let's get a chest xray next week

## 2017-09-22 LAB — WET PREP BY MOLECULAR PROBE
CANDIDA SPECIES: NOT DETECTED
Gardnerella vaginalis: NOT DETECTED
MICRO NUMBER:: 90074982
SPECIMEN QUALITY:: ADEQUATE
Trichomonas vaginosis: NOT DETECTED

## 2017-09-22 LAB — URINE CULTURE
MICRO NUMBER:: 90073362
Result:: NO GROWTH
SPECIMEN QUALITY:: ADEQUATE

## 2017-11-22 ENCOUNTER — Other Ambulatory Visit: Payer: Self-pay | Admitting: Family Medicine

## 2018-01-22 NOTE — Progress Notes (Signed)
CARDIOLOGY OFFICE NOTE  Date:  01/23/2018    Patricia Fischer Date of Birth: 1956-02-01 Medical Record #[id number]  PCP:  Arnetha Courser, MD  Cardiologist:  Jennings Books    Chief Complaint  Patient presents with  . Congestive Heart Failure    Seen for Dr. Tamala Julian    History of Present Illness: Patricia Fischer is a 62 y.o. female who presents today for a 5 month check. Seen for Dr. Tamala Julian.   She has a history of oldpulmonaryembolism, chronic systolic heart failure, essential hypertension, and chronic anticoagulation therapy.  EF 52% by MRI December 2018. We did not have to refer for ICD implant.  Soft BP has limited her medical therapy. She is on disability.   Last seen in December by Dr. Tamala Julian - was doing well - has continued to have stable appropriate dyspnea on exertion.   Comes in today. Here alone. She notes she is doing ok. Her breathing is stable. She has some transient dizziness - mostly in the morning. No syncope. No chest pain. She does ok if she paces herself for activities. She has sold her home - going to travel for about 5 weeks this summer - does not really know where she will end up - this is a little unsettling for her.   Past Medical History:  Diagnosis Date  . Anxiety   . Asthma   . Blood transfusion [place]  . Breast cancer ([place]) 04/26/2008   left breast  . Breast cancer, left breast ([place]) 2011   DCIS  . Chronic migraine without aura, with intractable migraine, so stated, with status migrainosus March 2016  . Chronic systolic CHF (congestive heart failure) ([place]) 05/05/2015   a. Dx 04/2015 - EF initially 25-30% with RV dysfunction, f/u echo 08/2015 technically difficult, EF 35-40%, anterior, anteroseptal, apical and inferoapical severe hypokinesis suggestive of LAD territory ischemia/infarct, grade 1 DD, mild MR, RV normal.  . Depression   . DVT (deep venous thrombosis) ([place]) Aug. 2016   a. Dx 04/2015 - patient returned 05/2015 after  noncompliance with Xarelto.  . Environmental allergies   . Essential hypertension   . Family history of adverse reaction to anesthesia    " My Mother would get deathly sick "  . GERD (gastroesophageal reflux disease)   . Head injury 2011  . Hemorrhoids   . LBBB (left bundle branch block)   . Obesity   . Orthostatic hypotension   . Pneumonia 2012  . Saddle pulmonary embolus ([place]) Aug. 2016   a. Dx 04/2015 - patient returned 05/2015 after noncompliance with Xarelto.  . Shortness of breath dyspnea   . Syncope    a. Recurrent syncope in 2016 felt 2/2 orthostasis in setting of PE.    Past Surgical History:  Procedure Laterality Date  . ABDOMINAL HYSTERECTOMY  1986  . ANAL FISSURE REPAIR    . ANTERIOR CERVICAL DECOMP/DISCECTOMY FUSION  01/23/2012   Procedure: ANTERIOR CERVICAL DECOMPRESSION/DISCECTOMY FUSION 1 LEVEL;  Surgeon: Elaina Hoops, MD;  Location: [facility];  Service: Neurosurgery;  Laterality: N/A;  Cervical five-six anterior cervical discectomy fusion with plating  . APPENDECTOMY    . BREAST BIOPSY Left 06/2016   US biopsy  . BREAST SURGERY Left 2011   Breast lumpectomy  . CARDIAC CATHETERIZATION  2010   [facility]; pt states it was "clear"  . CARDIAC CATHETERIZATION N/A 10/28/2015   Procedure: Right/Left Heart Cath and Coronary Angiography;  Surgeon: Mallie Mussel  Nicholes Stairs, MD;  Location: [facility];  Service: Cardiovascular;  Laterality: N/A;  . CARPAL TUNNEL RELEASE Bilateral   . CESAREAN SECTION  1982, 1979  . CHOLECYSTECTOMY    . COLONOSCOPY  01/16/2009   Dr Bary Castilla  . KNEE CARTILAGE SURGERY Left   . SHOULDER ARTHROSCOPY WITH SUBACROMIAL DECOMPRESSION, ROTATOR CUFF REPAIR AND BICEP TENDON REPAIR Right 11/22/2013   Procedure: RIGHT SHOULDER ATHROSCOPY OPEN SUBSCAP REPAIR DELTA-PECTORAL ;  Surgeon: Augustin Schooling, MD;  Location: [facility];  Service: Orthopedics;  Laterality: Right;  . TARSAL TUNNEL RELEASE Left 1990  . TONSILLECTOMY    . TUBAL LIGATION        Medications: Current Meds  Medication Sig  . albuterol (PROAIR HFA) 108 (90 Base) MCG/ACT inhaler Inhale 2 puffs into the lungs every 4 (four) hours as needed for wheezing or shortness of breath.  Marland Kitchen amitriptyline (ELAVIL) 25 MG tablet Take 3 tablets (75 mg total) by mouth at bedtime. (Patient taking differently: Take 75 mg by mouth at bedtime as needed. )  . carvedilol (COREG) 6.25 MG tablet Take 1 tablet (6.25 mg total) by mouth 2 (two) times daily with a meal.  . fluticasone (FLONASE) 50 MCG/ACT nasal spray Place 1 spray into both nostrils daily as needed for allergies or rhinitis.  Marland Kitchen losartan (COZAAR) 25 MG tablet Take 25 mg by mouth daily.  . rivaroxaban (XARELTO) 20 MG TABS tablet TAKE 1 TABLET BY MOUTH DAILY WITH SUPPER.  Marland Kitchen spironolactone (ALDACTONE) 25 MG tablet TAKE 1 TABLET BY MOUTH EVERY MONDAY, WEDNESDAY, AND FRIDAY.  Marland Kitchen tiZANidine (ZANAFLEX) 2 MG tablet TAKE 1 TO 2 TABLETS BY MOUTH EVERY EIGHT HOURS AS NEEDED FOR MUSCLE SPASMS     Allergies: Allergies  Allergen Reactions  . Gadolinium Derivatives Shortness Of Breath and Nausea And Vomiting  . Levaquin [Levofloxacin In D5w] Itching    Was giving with vancomycin at same time - unsure which one pt had a reaction to  . Sulfa Antibiotics Swelling    "Ears swelled up like Dumbo"  . Vancomycin Itching    Was giving at same time as Levaquin - unsure which pt had reaction to  . Aripiprazole     Dry mouth with sores   . Codeine Itching and Rash  . Oxycodone Itching  . Penicillins Hives    Has patient had a PCN reaction causing immediate rash, facial/tongue/throat swelling, SOB or lightheadedness with hypotension: Yes Has patient had a PCN reaction causing severe rash involving mucus membranes or skin necrosis: No Has patient had a PCN reaction that required hospitalization No Has patient had a PCN reaction occurring within the last 10 years: No If all of the above answers are "NO", then may proceed with Cephalosporin use.     Social History: The patient  reports that she has never smoked. She has never used smokeless tobacco. She reports that she drinks alcohol. She reports that she does not use drugs.   Family History: The patient's family history includes Aneurysm in her paternal grandfather; Breast cancer in her cousin, cousin, cousin, and paternal aunt; Breast cancer (age of onset: 38) in her mother; COPD in her father and paternal grandfather; Cancer in her father and mother; Heart attack in her paternal grandmother; Heart disease in her paternal grandmother; Hypertension in her mother; Migraines in her mother; Rectal cancer in her father; Stroke in her paternal grandmother.   Review of Systems: Please see the history of present illness.   Otherwise, the review of systems  is positive for none.   All other systems are reviewed and negative.   Physical Exam: VS:  BP 124/80 (BP Location: Left Arm, Patient Position: Sitting, Cuff Size: Normal)   Pulse 81   Ht 5' 4.5" (1.638 m)   Wt 244 lb (110.7 kg)   SpO2 95% Comment: at rest  BMI 41.24 kg/m  .  BMI Body mass index is 41.24 kg/m.  Wt Readings from Last 3 Encounters:  01/23/18 244 lb (110.7 kg)  09/21/17 231 lb 8 oz (105 kg)  09/07/17 240 lb (108.9 kg)    General: Pleasant. Obese. Alert and in no acute distress.  She is up from 241 since I last saw her in October.  HEENT: Normal.  Neck: Supple, no JVD, carotid bruits, or masses noted.  Cardiac: Regular rate and rhythm. Heart tones are distant. No edema.  Respiratory:  Lungs are clear to auscultation bilaterally with normal work of breathing.  GI: Soft and nontender.  MS: No deformity or atrophy. Gait and ROM intact.  Skin: Warm and dry. Color is normal.  Neuro:  Strength and sensation are intact and no gross focal deficits noted.  Psych: Alert, appropriate and with normal affect.   LABORATORY DATA:  EKG:  EKG is not ordered today.  Lab Results  Component Value Date   WBC 11.5 (H)  09/08/2017   HGB 12.5 09/08/2017   HCT 38.6 09/08/2017   PLT 150 09/08/2017   GLUCOSE 125 (H) 09/08/2017   CHOL 172 07/11/2017   TRIG 161 (H) 07/11/2017   HDL 40 (L) 07/11/2017   LDLCALC 104 (H) 07/11/2017   ALT 12 (L) 09/07/2017   AST 23 09/07/2017   NA 136 09/08/2017   K 3.7 09/08/2017   CL 99 (L) 09/08/2017   CREATININE 0.81 09/08/2017   BUN 13 09/08/2017   CO2 30 09/08/2017   TSH 1.41 07/11/2017   INR 1.12 09/07/2017     BNP (last 3 results) No results for input(s): BNP in the last 8760 hours.  ProBNP (last 3 results) No results for input(s): PROBNP in the last 8760 hours.   Other Studies Reviewed Today:    Cardiac MRI IMPRESSION: 1.  Normal LV size with mild diffuse hypokinesis, EF 52%.  2.  Normal RV size and systolic function.  3. No myocardial LGE, so no definitive evidence for prior MI, infiltrative disease, or myocarditis.  Loralie Champagne   On: 08/21/2017 13:54    Echocardiogram May 2018:  ------------------------------------------------------------------- Study Conclusions  - Left ventricle: The cavity size was normal. Wall thickness was increased in a pattern of mild LVH. Systolic function was moderately to severely reduced. The estimated ejection fraction was in the range of 30% to 35%. Diffuse hypokinesis. Doppler parameters are consistent with abnormal left ventricular relaxation (grade 1 diastolic dysfunction).  Impressions:  - Moderate to severe global reduction in LV systolic function; mild diastolic dysfunction; trace MR and TR.   Assessment/Plan:  1. NICM with LVEF previously down in the 30-40% range - s/p cardiac MRI from December with EF of 52%. She is doing ok clinically.  BP has limited pushing her medicines and I am hesitant to make changes with all the traveling she is getting ready to do and still with some dizziness. For now, no changes. Also, with low normal LVEF, her feeling well, we will continue the  current medical management strategy.  2. History of PE - on chronic anticoagulation - lab today.  3. Chronic anticoagulation - no problems noted -  on indefinite therapy. Lab today.  4.    Obesity - discussed again.   Current medicines are reviewed with the patient today.  The patient does not have concerns regarding medicines other than what has been noted above.  The following changes have been made:  See above.  Labs/ tests ordered today include:    Orders Placed This Encounter  Procedures  . Basic metabolic panel  . CBC  . Hepatic function panel     Disposition:   FU with me or Dr. Tamala Julian in September.   Patient is agreeable to this plan and will call if any problems develop in the interim.   SignedTruitt Merle, NP  01/23/2018 4:22 PM  [facility] [address] [place], [place]  [address] Phone: [phone] Fax: [phone]

## 2018-01-23 ENCOUNTER — Encounter (INDEPENDENT_AMBULATORY_CARE_PROVIDER_SITE_OTHER): Payer: Self-pay

## 2018-01-23 ENCOUNTER — Encounter: Payer: Self-pay | Admitting: Nurse Practitioner

## 2018-01-23 ENCOUNTER — Ambulatory Visit: Payer: Medicare Other | Admitting: Nurse Practitioner

## 2018-01-23 VITALS — BP 124/80 | HR 81 | Ht 64.5 in | Wt 244.0 lb

## 2018-01-23 DIAGNOSIS — I10 Essential (primary) hypertension: Secondary | ICD-10-CM

## 2018-01-23 DIAGNOSIS — I447 Left bundle-branch block, unspecified: Secondary | ICD-10-CM

## 2018-01-23 DIAGNOSIS — Z7901 Long term (current) use of anticoagulants: Secondary | ICD-10-CM | POA: Diagnosis not present

## 2018-01-23 DIAGNOSIS — I5022 Chronic systolic (congestive) heart failure: Secondary | ICD-10-CM | POA: Diagnosis not present

## 2018-01-23 NOTE — Patient Instructions (Signed)
We will be checking the following labs today - BMET, HPF and CBC   Medication Instructions:    Continue with your current medicines.     Testing/Procedures To Be Arranged:  N/A  Follow-Up:   See me or Dr. Tamala Julian at the end of September.     Other Special Instructions:   N/A    If you need a refill on your cardiac medications before your next appointment, please call your pharmacy.   Call the [place] office at [phone] if you have any questions, problems or concerns.

## 2018-01-24 LAB — HEPATIC FUNCTION PANEL
ALT: 13 IU/L (ref 0–32)
AST: 19 IU/L (ref 0–40)
Albumin: 4.1 g/dL (ref 3.6–4.8)
Alkaline Phosphatase: 93 IU/L (ref 39–117)
Bilirubin Total: 0.4 mg/dL (ref 0.0–1.2)
Bilirubin, Direct: 0.13 mg/dL (ref 0.00–0.40)
Total Protein: 7 g/dL (ref 6.0–8.5)

## 2018-01-24 LAB — BASIC METABOLIC PANEL
BUN/Creatinine Ratio: 13 (ref 12–28)
BUN: 11 mg/dL (ref 8–27)
CO2: 26 mmol/L (ref 20–29)
Calcium: 9.3 mg/dL (ref 8.7–10.3)
Chloride: 101 mmol/L (ref 96–106)
Creatinine, Ser: 0.85 mg/dL (ref 0.57–1.00)
GFR calc Af Amer: 84 mL/min/{1.73_m2} (ref >59)
GFR calc non Af Amer: 73 mL/min/{1.73_m2} (ref >59)
Glucose: 77 mg/dL (ref 65–99)
Potassium: 4 mmol/L (ref 3.5–5.2)
Sodium: 141 mmol/L (ref 134–144)

## 2018-01-24 LAB — CBC
Hematocrit: 37.8 % (ref 34.0–46.6)
Hemoglobin: 12.4 g/dL (ref 11.1–15.9)
MCH: 27 pg (ref 26.6–33.0)
MCHC: 32.8 g/dL (ref 31.5–35.7)
MCV: 82 fL (ref 79–97)
Platelets: 174 10*3/uL (ref 150–450)
RBC: 4.59 x10E6/uL (ref 3.77–5.28)
RDW: 14 % (ref 12.3–15.4)
WBC: 8.2 10*3/uL (ref 3.4–10.8)

## 2018-02-23 DIAGNOSIS — H00024 Hordeolum internum left upper eyelid: Secondary | ICD-10-CM | POA: Diagnosis not present

## 2018-02-26 ENCOUNTER — Ambulatory Visit: Payer: Medicare Other | Admitting: Interventional Cardiology

## 2018-04-02 ENCOUNTER — Other Ambulatory Visit: Payer: Self-pay | Admitting: Interventional Cardiology

## 2018-04-02 MED ORDER — LOSARTAN POTASSIUM 25 MG PO TABS: 25.0000 mg | ORAL_TABLET | Freq: Every day | ORAL | 3 refills | Status: DC

## 2018-04-02 MED ORDER — SPIRONOLACTONE 25 MG PO TABS
ORAL_TABLET | ORAL | 3 refills | Status: DC
Start: 2018-04-02 — End: 2019-01-21

## 2018-04-02 NOTE — Telephone Encounter (Signed)
Pt's medication was sent to pt's pharmacy as requested. Confirmation received.  °

## 2018-04-28 ENCOUNTER — Other Ambulatory Visit: Payer: Self-pay | Admitting: Family Medicine

## 2018-05-08 IMAGING — MR MR LUMBAR SPINE W/O CM
5 series · 38 of 48 positions shown · non-contrast
Comparison: CT abdomen and pelvis July 27, 2016

CLINICAL DATA: Fell at church last week. Lower abdominal pain. Loss
of bladder control today with poor rectal tone.

EXAM:
MRI LUMBAR SPINE WITHOUT CONTRAST
TECHNIQUE: Multiplanar, multisequence MR imaging of the lumbar spine was
performed. No intravenous contrast was administered.

[Series 2: T2 · sagittal · 4.0mm · 1.02mm/px · 6 of 17 slices shown (1 of 2)]
[im 1/17]
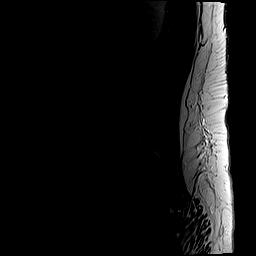
[im 4/17]
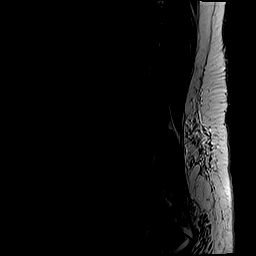
[im 7/17]
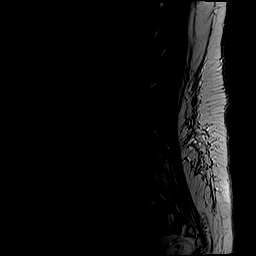
[im 10/17]
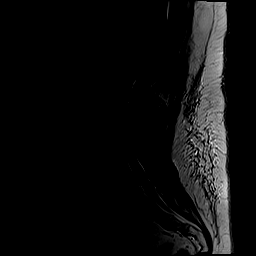
[im 13/17]
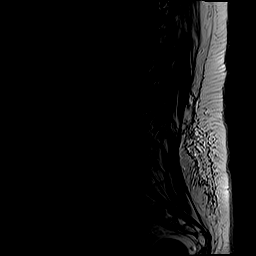
[im 17/17]
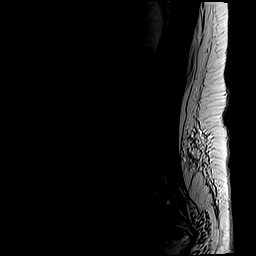

[Series 3: T1 · sagittal · 4.0mm · 1.02mm/px · 6 of 17 slices shown (1 of 2)]
[im 1/17]
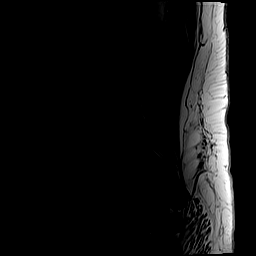
[im 4/17]
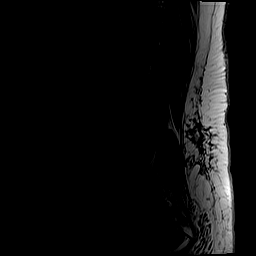
[im 7/17]
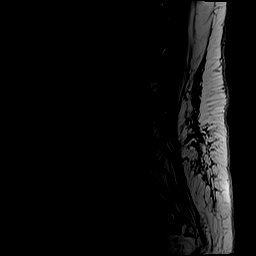
[im 10/17]
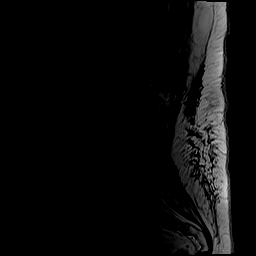
[im 13/17]
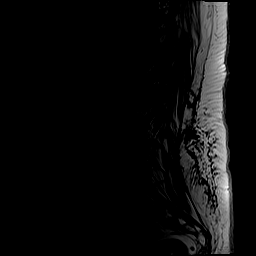
[im 17/17]
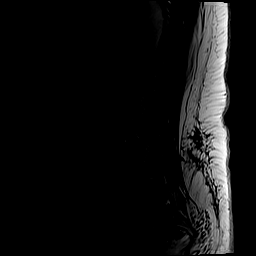

[Series 4: STIR · sagittal · 4.0mm · 1.02mm/px · 6 of 17 slices shown]
[im 1/17]
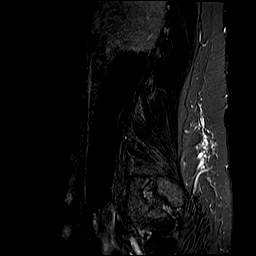
[im 4/17]
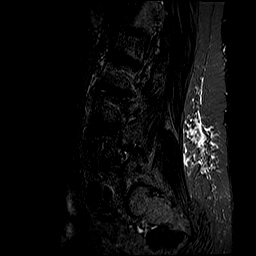
[im 7/17]
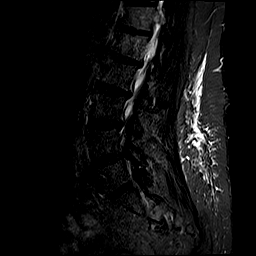
[im 10/17]
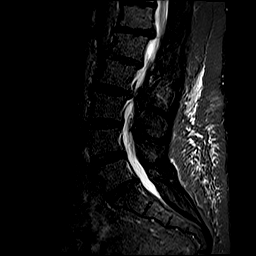
[im 13/17]
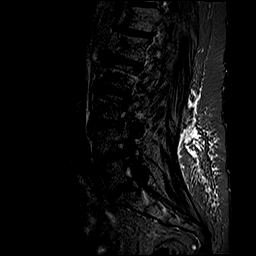
[im 17/17]
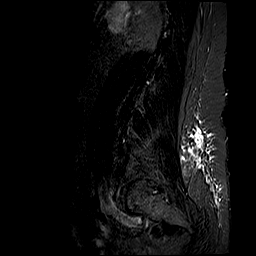

[Series 5: T2 · axial · 4.0mm · 0.78mm/px · z∈[-38,+190]mm · 11 of 40 slices shown (2 of 2)]
[im 1/40]
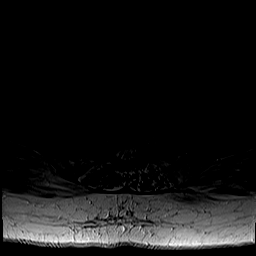
[im 3/40]
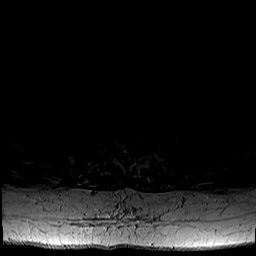
[im 6/40]
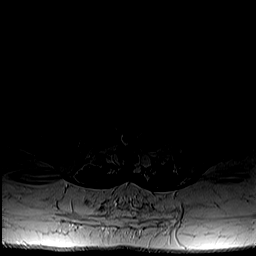
[im 9/40]
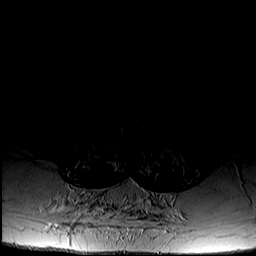
[im 12/40]
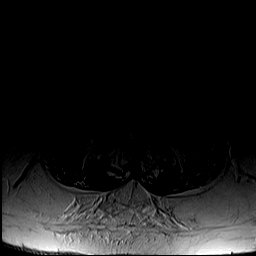
[im 17/40]
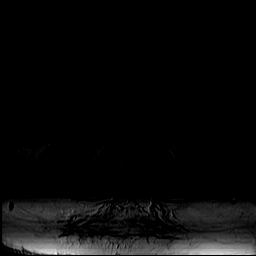
[im 20/40]
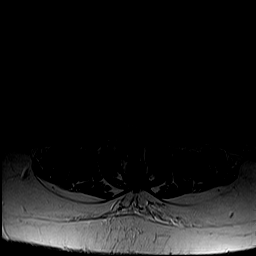
[im 23/40]
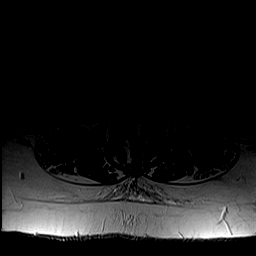
[im 28/40]
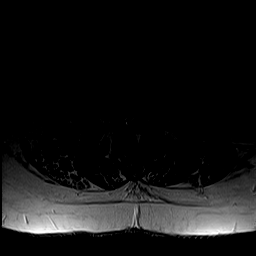
[im 34/40]
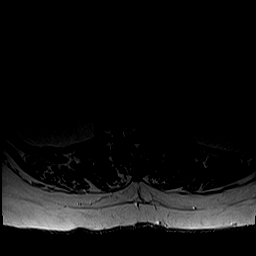
[im 40/40]
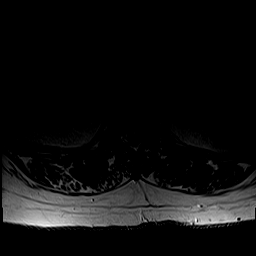

[Series 6: T1 · axial · 4.0mm · 0.39mm/px · z∈[-38,+190]mm · 9 of 40 slices shown (2 of 2)]
[im 1/40]
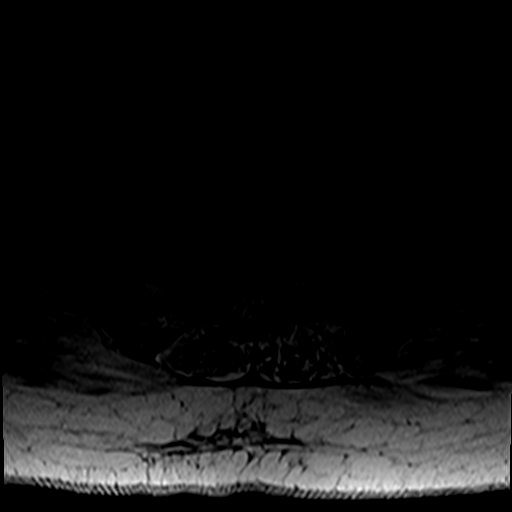
[im 6/40]
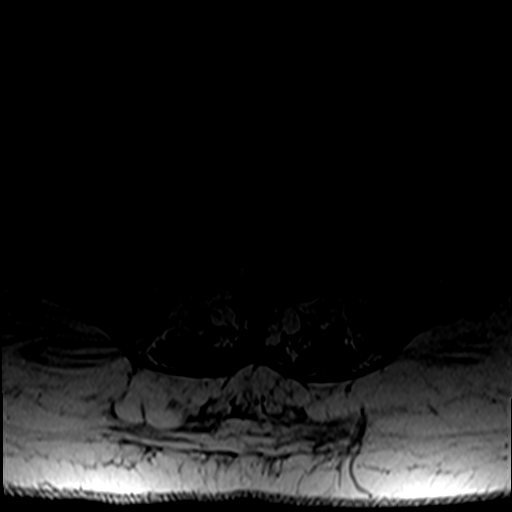
[im 12/40]
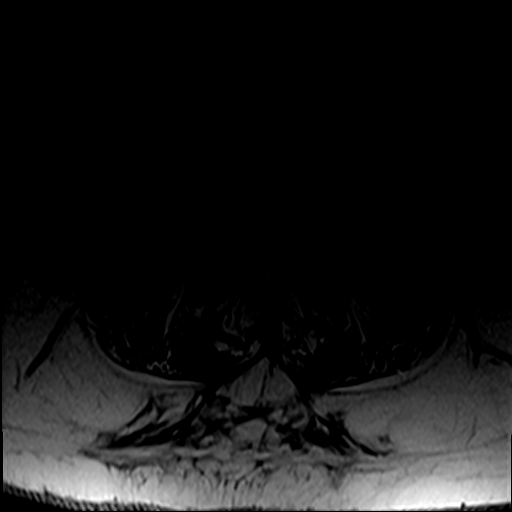
[im 17/40]
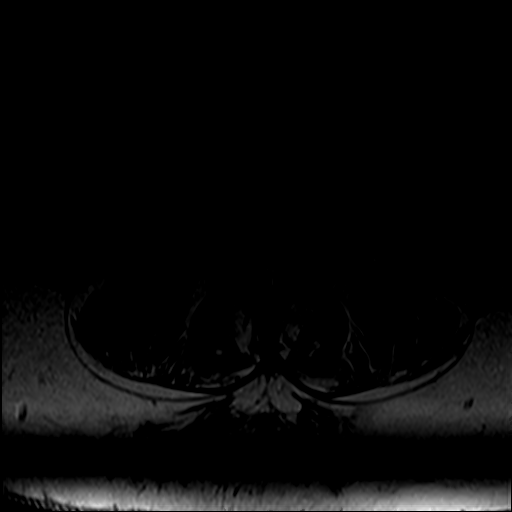
[im 20/40]
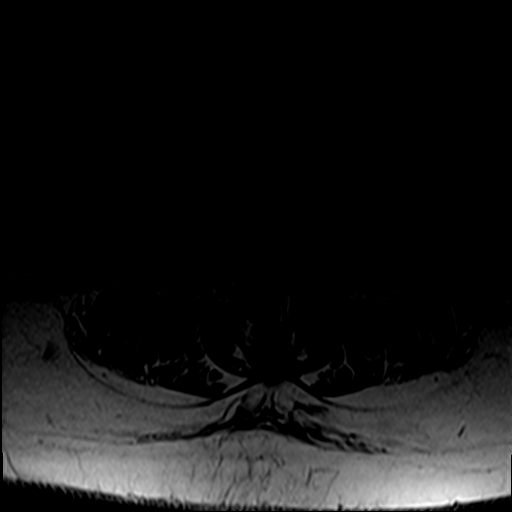
[im 23/40]
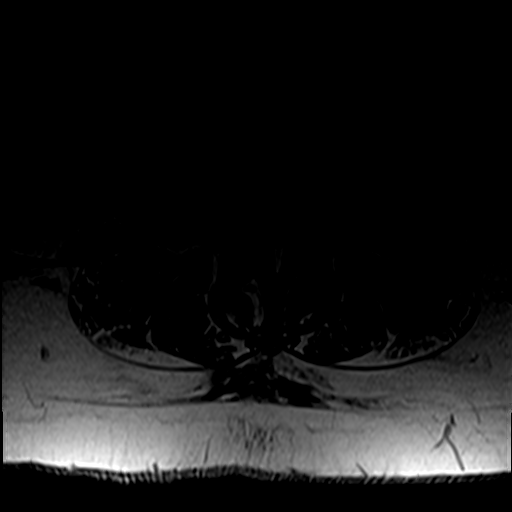
[im 28/40]
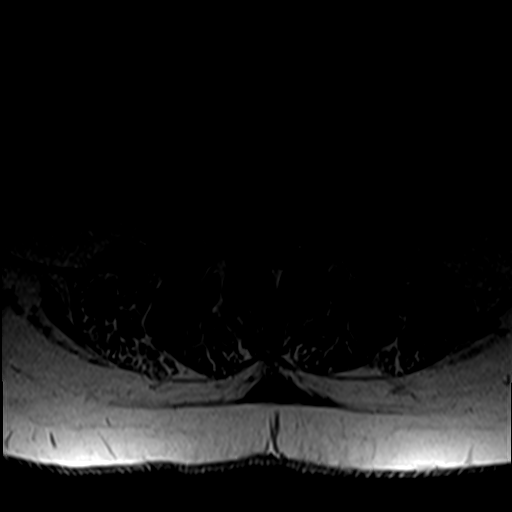
[im 34/40]
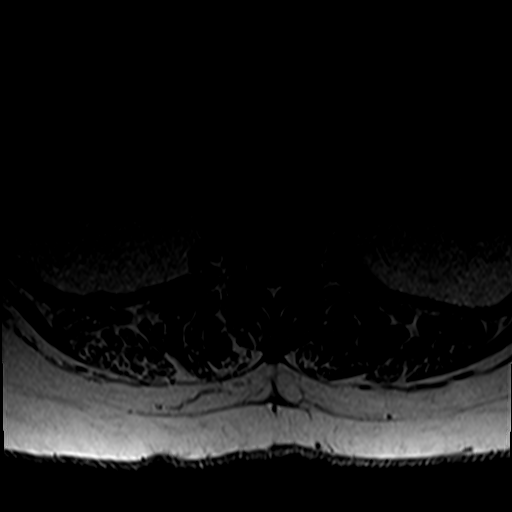
[im 40/40]
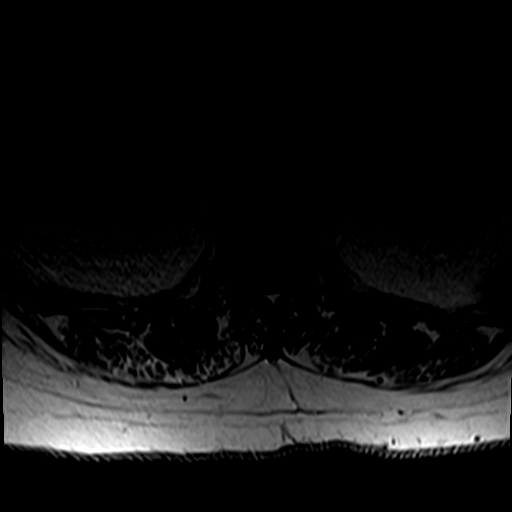

[38 of 48 positions shown; findings below may reference images not displayed]

FINDINGS: SEGMENTATION: For the purposes of this report, the last well-formed
intervertebral disc is reported as L5-S1.

ALIGNMENT: Maintained lumbar lordosis. Minimal grade 1 L1-2
retrolisthesis and grade 1 L4-5 anterolisthesis. No spondylolysis.

VERTEBRAE:Vertebral bodies intact. Mild T12-L1 and L1-2 disc height
loss, mild L5-S1 disc height loss. Disc desiccation and multilevel
mild chronic discogenic endplate changes. Moderate subacute
discogenic endplate changes L1-2. T1 and T2 bright hemangioma L1.
Scattered old Schmorl's nodes. No acute or abnormal bone marrow
signal.

CONUS MEDULLARIS AND CAUDA EQUINA: Conus medullaris terminates at
T12-L1 and demonstrates normal morphology and signal
characteristics. Cauda equina is normal.

PARASPINAL AND OTHER SOFT TISSUES: Included prevertebral and
paraspinal soft tissues are nonacute. Mild symmetric paraspinal
muscle atrophy.

DISC LEVELS:

T12-L1: Broad-based disc bulge and RIGHT central disc protrusion
total measuring 5 mm in AP dimension. No canal stenosis though,
partial effacement of RIGHT lateral recess could affect the
traversing RIGHT L1 nerve. Minimal RIGHT neural foraminal narrowing.

L1-2: Retrolisthesis. 3 mm broad-based disc bulge, mild facet
arthropathy and ligamentum flavum redundancy without canal stenosis.
Moderate RIGHT, mild to moderate LEFT neural foraminal narrowing.

L2-3: 4 mm broad-based disc bulge, annular fissure. Moderate facet
arthropathy and ligamentum flavum redundancy. Mild canal stenosis.
Moderate RIGHT, mild-to-moderate LEFT neural foraminal narrowing.

L3-4: 4 mm broad-based disc bulge and tiny central disc extrusion.
Superimposed RIGHT subarticular to extraforaminal moderate disc
protrusion displacing the exited RIGHT L3 nerve. Moderate to severe
facet arthropathy and ligamentum flavum redundancy without canal
stenosis. Moderate to severe RIGHT, moderate LEFT neural foraminal
narrowing.

L4-5: Small broad-based disc bulge, moderate RIGHT subarticular disc
protrusion. Severe facet arthropathy and ligamentum flavum
redundancy. No canal stenosis. Moderate to severe RIGHT, moderate
LEFT neural foraminal narrowing.

L5-S1: 6 mm central disc protrusion annular fissure. Mild facet
arthropathy. No canal stenosis though there is encroachment upon the
traversing S1 nerves within the lateral recess. No neural foraminal
narrowing.
IMPRESSION: 1. Degenerative change of the lumbar spine without acute osseous
process. Minimal grade 1 L1-2 retrolisthesis and L4-5
anterolisthesis without spondylolysis.
2. Mild canal stenosis L2-3.
3. T12-L1 through L4-5 neural foraminal narrowing: Moderate to
severe on the RIGHT at L3-4 in part due to moderate disc protrusion
resulting and exited RIGHT L3 nerve impingement.
4. Multilevel annular fissures.

## 2018-05-10 ENCOUNTER — Other Ambulatory Visit: Payer: Self-pay | Admitting: Nurse Practitioner

## 2018-05-10 NOTE — Telephone Encounter (Signed)
Xarelto 20mg  refill request received; pt is 62 yrs old, wt-110.7kg, Crea-0.85 on 01/23/18, last seen by Cecille Rubin on 01/23/18, CrCl-118.79ml/min; will send in refill to requested pharmacy.

## 2018-05-29 NOTE — Progress Notes (Signed)
Cardiology Office Note:    Date:  05/30/2018   ID:  [name], DOB 11/28/1955, MRN [id number]  PCP:  Arnetha Courser, MD  Cardiologist:  No primary care provider on file.   Referring MD: Arnetha Courser, MD   Chief Complaint  Patient presents with  . Congestive Heart Failure    History of Present Illness:    Patricia Fischer is a 62 y.o. female with a hx of old pulmonary embolism, chronic systolic heart failure, essential hypertension, and chronic anticoagulation therapy.  EF 52% by MRI December 2018.   Some recent cough.  Denies orthopnea.  No lower extremity edema.  Denies chest pain.  Will be living in [place] for at least a year.  Wants paper prescriptions to be able to get refills while in [place].  Has a care for her mother and father.  Will be moving to [facility] for at least one year.  Past Medical History:  Diagnosis Date  . Anxiety   . Asthma   . Blood transfusion [place]  . Breast cancer ([facility]) 04/26/2008   left breast  . Breast cancer, left breast ([place]) 2011   DCIS  . Chronic migraine without aura, with intractable migraine, so stated, with status migrainosus March 2016  . Chronic systolic CHF (congestive heart failure) ([facility]) 05/05/2015   a. Dx 04/2015 - EF initially 25-30% with RV dysfunction, f/u echo 08/2015 technically difficult, EF 35-40%, anterior, anteroseptal, apical and inferoapical severe hypokinesis suggestive of LAD territory ischemia/infarct, grade 1 DD, mild MR, RV normal.  . Depression   . DVT (deep venous thrombosis) ([facility]) Aug. 2016   a. Dx 04/2015 - patient returned 05/2015 after noncompliance with Xarelto.  . Environmental allergies   . Essential hypertension   . Family history of adverse reaction to anesthesia    " My Mother would get deathly sick "  . GERD (gastroesophageal reflux disease)   . Head injury 2011  . Hemorrhoids   . LBBB (left bundle branch block)   . Obesity   . Orthostatic hypotension    . Pneumonia 2012  . 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 Dx 04/2015 - patient returned 05/2015 after noncompliance with Xarelto.  . Shortness of breath dyspnea   . Syncope    a. Recurrent syncope in 2016 felt 2/2 orthostasis in setting of PE.    Past Surgical History:  Procedure Laterality Date  . ABDOMINAL HYSTERECTOMY  1986  . ANAL FISSURE REPAIR    . ANTERIOR CERVICAL DECOMP/DISCECTOMY FUSION  01/23/2012   Procedure: ANTERIOR CERVICAL DECOMPRESSION/DISCECTOMY FUSION 1 LEVEL;  Surgeon: Elaina Hoops, MD;  Location: [facility];  Service: Neurosurgery;  Laterality: N/A;  Cervical five-six anterior cervical discectomy fusion with plating  . APPENDECTOMY    . BREAST BIOPSY Left 06/2016   US biopsy  . BREAST SURGERY Left 2011   Breast lumpectomy  . CARDIAC CATHETERIZATION  2010   [facility]; pt states it was "clear"  . CARDIAC CATHETERIZATION N/A 10/28/2015   Procedure: Right/Left Heart Cath and Coronary Angiography;  Surgeon: Belva Crome, MD;  Location: [facility];  Service: Cardiovascular;  Laterality: N/A;  . CARPAL TUNNEL RELEASE Bilateral   . CESAREAN SECTION  1982, 1979  . CHOLECYSTECTOMY    . COLONOSCOPY  01/16/2009   Dr Bary Castilla  . KNEE CARTILAGE SURGERY Left   . SHOULDER ARTHROSCOPY WITH SUBACROMIAL DECOMPRESSION, ROTATOR CUFF REPAIR AND BICEP TENDON REPAIR Right 11/22/2013  Procedure: RIGHT SHOULDER ATHROSCOPY OPEN SUBSCAP REPAIR DELTA-PECTORAL ;  Surgeon: Augustin Schooling, MD;  Location: [facility];  Service: Orthopedics;  Laterality: Right;  . TARSAL TUNNEL RELEASE Left 1990  . TONSILLECTOMY    . TUBAL LIGATION      Current Medications: Current Meds  Medication Sig  . albuterol (PROAIR HFA) 108 (90 Base) MCG/ACT inhaler Inhale 2 puffs into the lungs every 4 (four) hours as needed for wheezing or shortness of breath.  Marland Kitchen amitriptyline (ELAVIL) 25 MG tablet Take 3 tablets (75 mg total) by mouth at bedtime. (Patient taking differently: Take 75 mg by mouth  at bedtime as needed. )  . carvedilol (COREG) 6.25 MG tablet Take 1 tablet (6.25 mg total) by mouth 2 (two) times daily with a meal.  . fluticasone (FLONASE) 50 MCG/ACT nasal spray SPRAY 1 SPRAY INTO EACH NOSTRIL EVERY DAY AS NEEDED  . losartan (COZAAR) 25 MG tablet Take 1 tablet (25 mg total) by mouth daily.  Marland Kitchen spironolactone (ALDACTONE) 25 MG tablet TAKE 1 TABLET BY MOUTH EVERY MONDAY, WEDNESDAY, AND FRIDAY.  Marland Kitchen tiZANidine (ZANAFLEX) 2 MG tablet TAKE 1 TO 2 TABLETS BY MOUTH EVERY EIGHT HOURS AS NEEDED FOR MUSCLE SPASMS  . XARELTO 20 MG TABS tablet TAKE 1 TABLET BY MOUTH DAILY WITH SUPPER.     Allergies:   Gadolinium derivatives; Levaquin [levofloxacin in d5w]; Sulfa antibiotics; Vancomycin; Aripiprazole; Codeine; Oxycodone; and Penicillins   Social History   Socioeconomic History  . Marital status: Divorced    Spouse name: Not on file  . Number of children: 2  . Years of education: college-1  . Highest education level: Not on file  Occupational History  . Occupation: Unemployed  Social Needs  . Financial resource strain: Not on file  . Food insecurity:    Worry: Not on file    Inability: Not on file  . Transportation needs:    Medical: Not on file    Non-medical: Not on file  Tobacco Use  . Smoking status: Never Smoker  . Smokeless tobacco: Never Used  Substance and Sexual Activity  . Alcohol use: Yes    Comment: occasional wine  . Drug use: No  . Sexual activity: Never  Lifestyle  . Physical activity:    Days per week: Not on file    Minutes per session: Not on file  . Stress: Not on file  Relationships  . Social connections:    Talks on phone: Not on file    Gets together: Not on file    Attends religious service: Not on file    Active member of club or organization: Not on file    Attends meetings of clubs or organizations: Not on file    Relationship status: Not on file  Other Topics Concern  . Not on file  Social History Narrative   Independent at baseline.       Family History: The patient's family history includes Aneurysm in her paternal grandfather; Breast cancer in her cousin, cousin, cousin, and paternal aunt; Breast cancer (age of onset: 23) in her mother; COPD in her father and paternal grandfather; Cancer in her father and mother; Heart attack in her paternal grandmother; Heart disease in her paternal grandmother; Hypertension in her mother; Migraines in her mother; Rectal cancer in her father; Stroke in her paternal grandmother. There is no history of Anesthesia problems or Diabetes.  ROS:   Please see the history of present illness.    Developing bronchitis.  Denies orthopnea.  All other systems reviewed and are negative.  EKGs/Labs/Other Studies Reviewed:    The following studies were reviewed today: None  EKG:  EKG is not ordered today.  Recent Labs: 07/11/2017: TSH 1.41 01/23/2018: ALT 13; BUN 11; Creatinine, Ser 0.85; Hemoglobin 12.4; Platelets 174; Potassium 4.0; Sodium 141  Recent Lipid Panel    Component Value Date/Time   CHOL 172 07/11/2017 1503   TRIG 161 (H) 07/11/2017 1503   HDL 40 (L) 07/11/2017 1503   CHOLHDL 4.3 07/11/2017 1503   VLDL 18 05/17/2016 1454   LDLCALC 104 (H) 07/11/2017 1503    Physical Exam:    VS:  BP 122/76   Pulse (!) 109   Ht 5' 4.5" (1.638 m)   Wt 242 lb 6.4 oz (110 kg)   BMI 40.97 kg/m     Wt Readings from Last 3 Encounters:  05/30/18 242 lb 6.4 oz (110 kg)  01/23/18 244 lb (110.7 kg)  09/21/17 231 lb 8 oz (105 kg)     GEN: Morbid obesity, well developed in no acute distress HEENT: Normal NECK: No JVD. LYMPHATICS: No lymphadenopathy CARDIAC: RRR, no murmur, no gallop, no edema. VASCULAR: 2+ bilateral radial pulses.  No bruits. RESPIRATORY:  Clear to auscultation without rales, wheezing or rhonchi  ABDOMEN: Soft, non-tender, non-distended, No pulsatile mass, MUSCULOSKELETAL: No deformity  SKIN: Warm and dry NEUROLOGIC:  Alert and oriented x 3 PSYCHIATRIC:  Normal affect    ASSESSMENT:    1. Pulmonary hypertension (HCC)   2. Morbid obesity ([place])   3. LBBB (left bundle branch block)-rate related    4. Chronic systolic CHF (congestive heart failure) ([place])   5. Do not resuscitate status   6. Essential hypertension   7. Medication monitoring encounter    PLAN:    In order of problems listed above:  1. No clinical evidence of right heart failure. 2. Persistent 3. Not changed or reevaluated 4. Systolic heart failure improved to EF 51% by MRI less than 1 year ago. 5. Adequate blood pressure control currently.  Clinical follow-up in 4 to 6 months.  Establish with cardiologist in [facility] when she is settled in.  This will be safer and provide an option for treatment if she has decompensation.  Also needs to establish with a primary care physician.  And written prescriptions are given to allow her access to her medications until she can establish with a physician in [place].   Medication Adjustments/Labs and Tests Ordered: Current medicines are reviewed at length with the patient today.  Concerns regarding medicines are outlined above.  No orders of the defined types were placed in this encounter.  No orders of the defined types were placed in this encounter.   There are no Patient Instructions on file for this visit.   Signed, Sinclair Grooms, MD  05/30/2018 3:01 PM    [facility]

## 2018-05-30 ENCOUNTER — Encounter: Payer: Self-pay | Admitting: Interventional Cardiology

## 2018-05-30 ENCOUNTER — Ambulatory Visit: Payer: Medicare Other | Admitting: Interventional Cardiology

## 2018-05-30 VITALS — BP 122/76 | HR 109 | Ht 64.5 in | Wt 242.4 lb

## 2018-05-30 DIAGNOSIS — I272 Pulmonary hypertension, unspecified: Secondary | ICD-10-CM

## 2018-05-30 DIAGNOSIS — I447 Left bundle-branch block, unspecified: Secondary | ICD-10-CM

## 2018-05-30 DIAGNOSIS — Z66 Do not resuscitate: Secondary | ICD-10-CM | POA: Diagnosis not present

## 2018-05-30 DIAGNOSIS — Z5181 Encounter for therapeutic drug level monitoring: Secondary | ICD-10-CM

## 2018-05-30 DIAGNOSIS — I10 Essential (primary) hypertension: Secondary | ICD-10-CM

## 2018-05-30 DIAGNOSIS — I5022 Chronic systolic (congestive) heart failure: Secondary | ICD-10-CM | POA: Diagnosis not present

## 2018-05-30 NOTE — Patient Instructions (Signed)
Medication Instructions:  Your physician recommends that you continue on your current medications as directed. Please refer to the Current Medication list given to you today.  Paper prescriptions given to you for Xarelto, Spironolactone, Carvedilol, and Losartan with a 6 months supply for you to be able to fill in [place]: None ordered  Testing/Procedures: None ordered  Follow-Up: Your physician wants you to follow-up in: 4-6 months with APP on Dr. Oconnor Caul team. Dennis Bast will receive a reminder letter in the mail two months in advance. If you don't receive a letter, please call our office to schedule the follow-up appointment.   Any Other Special Instructions Will Be Listed Below (If Applicable).  YOU NEED TO ESTABLISH WITH A CARDIOLOGIST IN [place]   If you need a refill on your cardiac medications before your next appointment, please call your pharmacy.

## 2018-06-05 IMAGING — CR DG CHEST 2V
2 series · 2 of 2 positions shown · non-contrast
Comparison: Chest x-ray of December 22, 2015

CLINICAL DATA: Cough, malaise, sore throat, and fever. Found have
decreased oxygen saturation with exertion. History of asthma, CHF,
morbid obesity.

EXAM:
CHEST  2 VIEW

[chest pa]
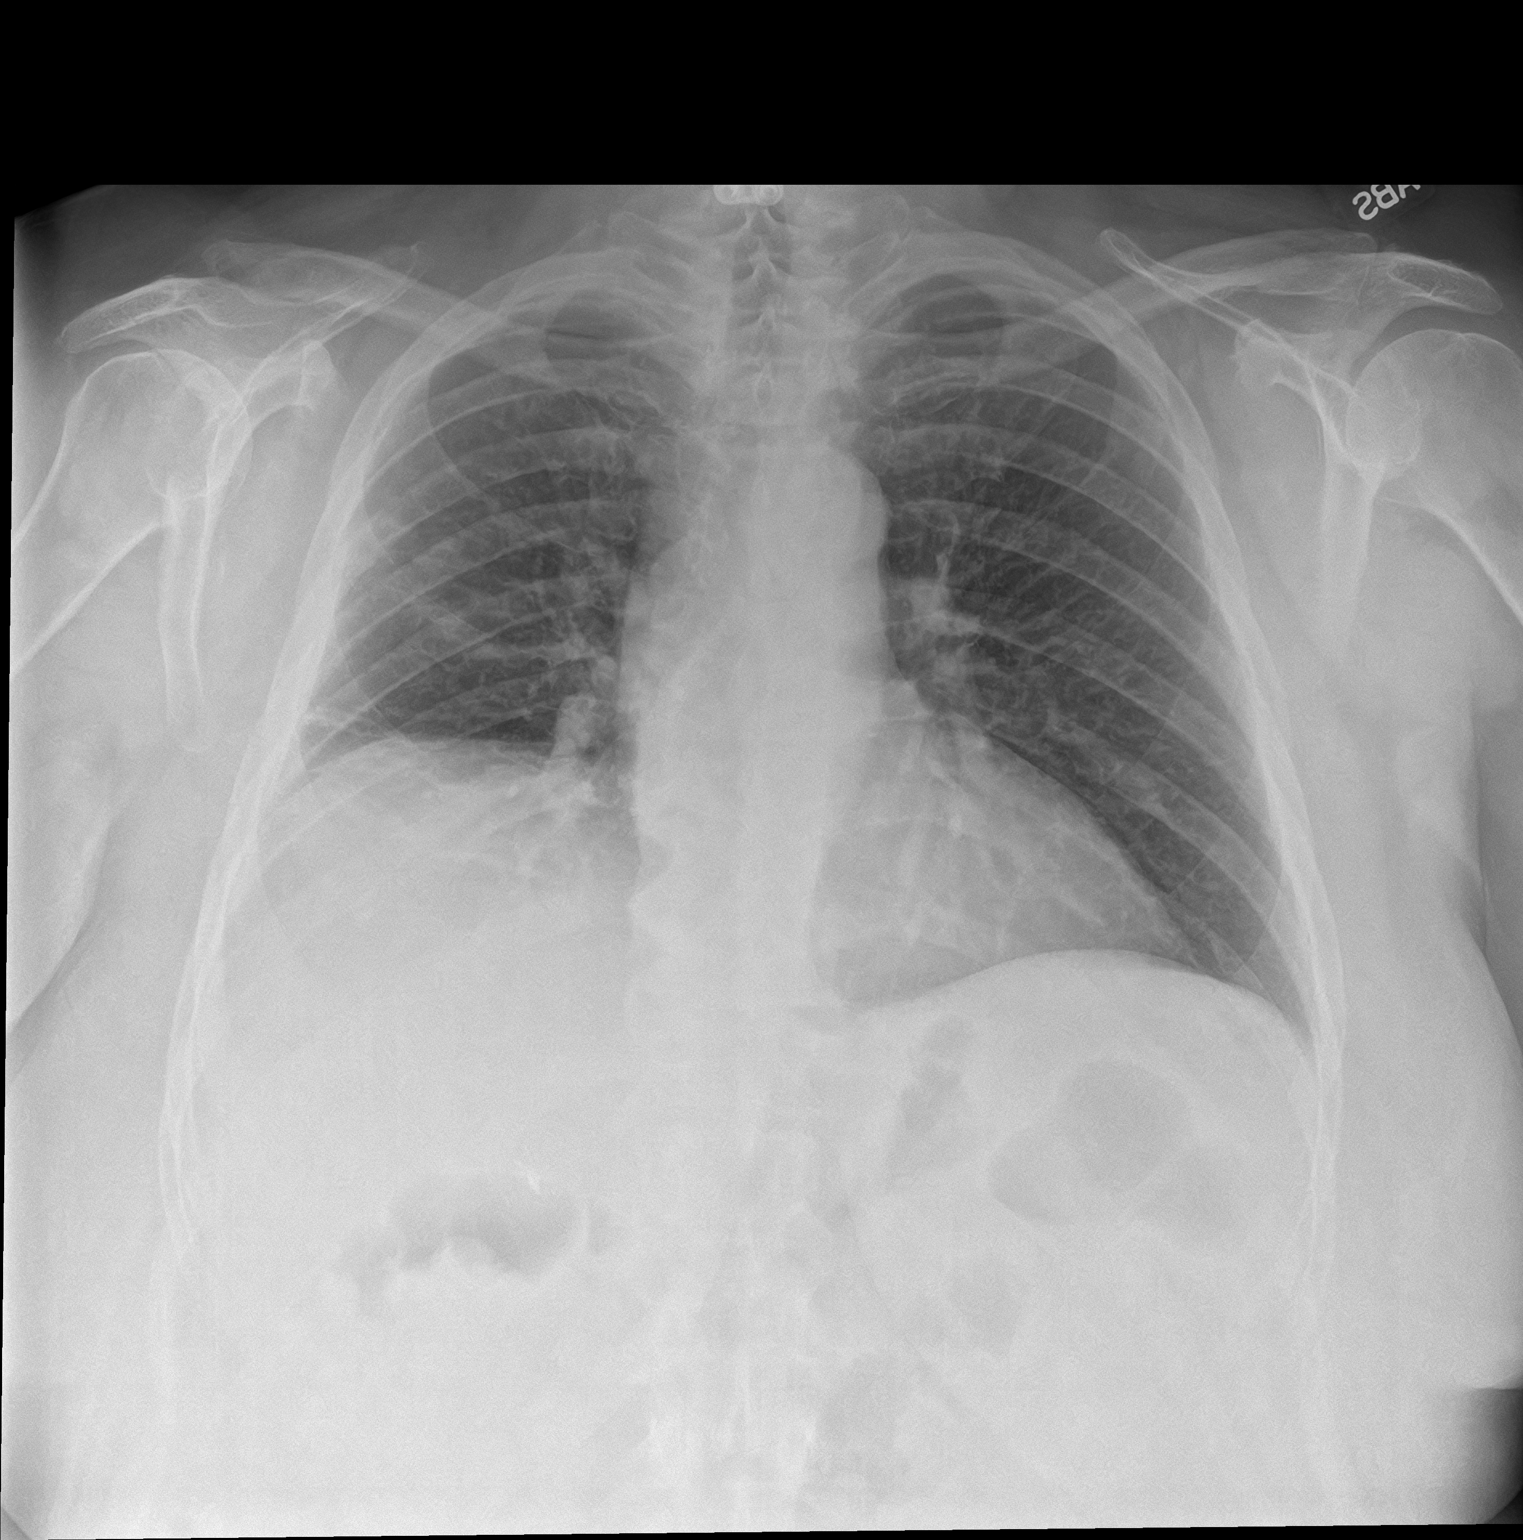

[chest lat]
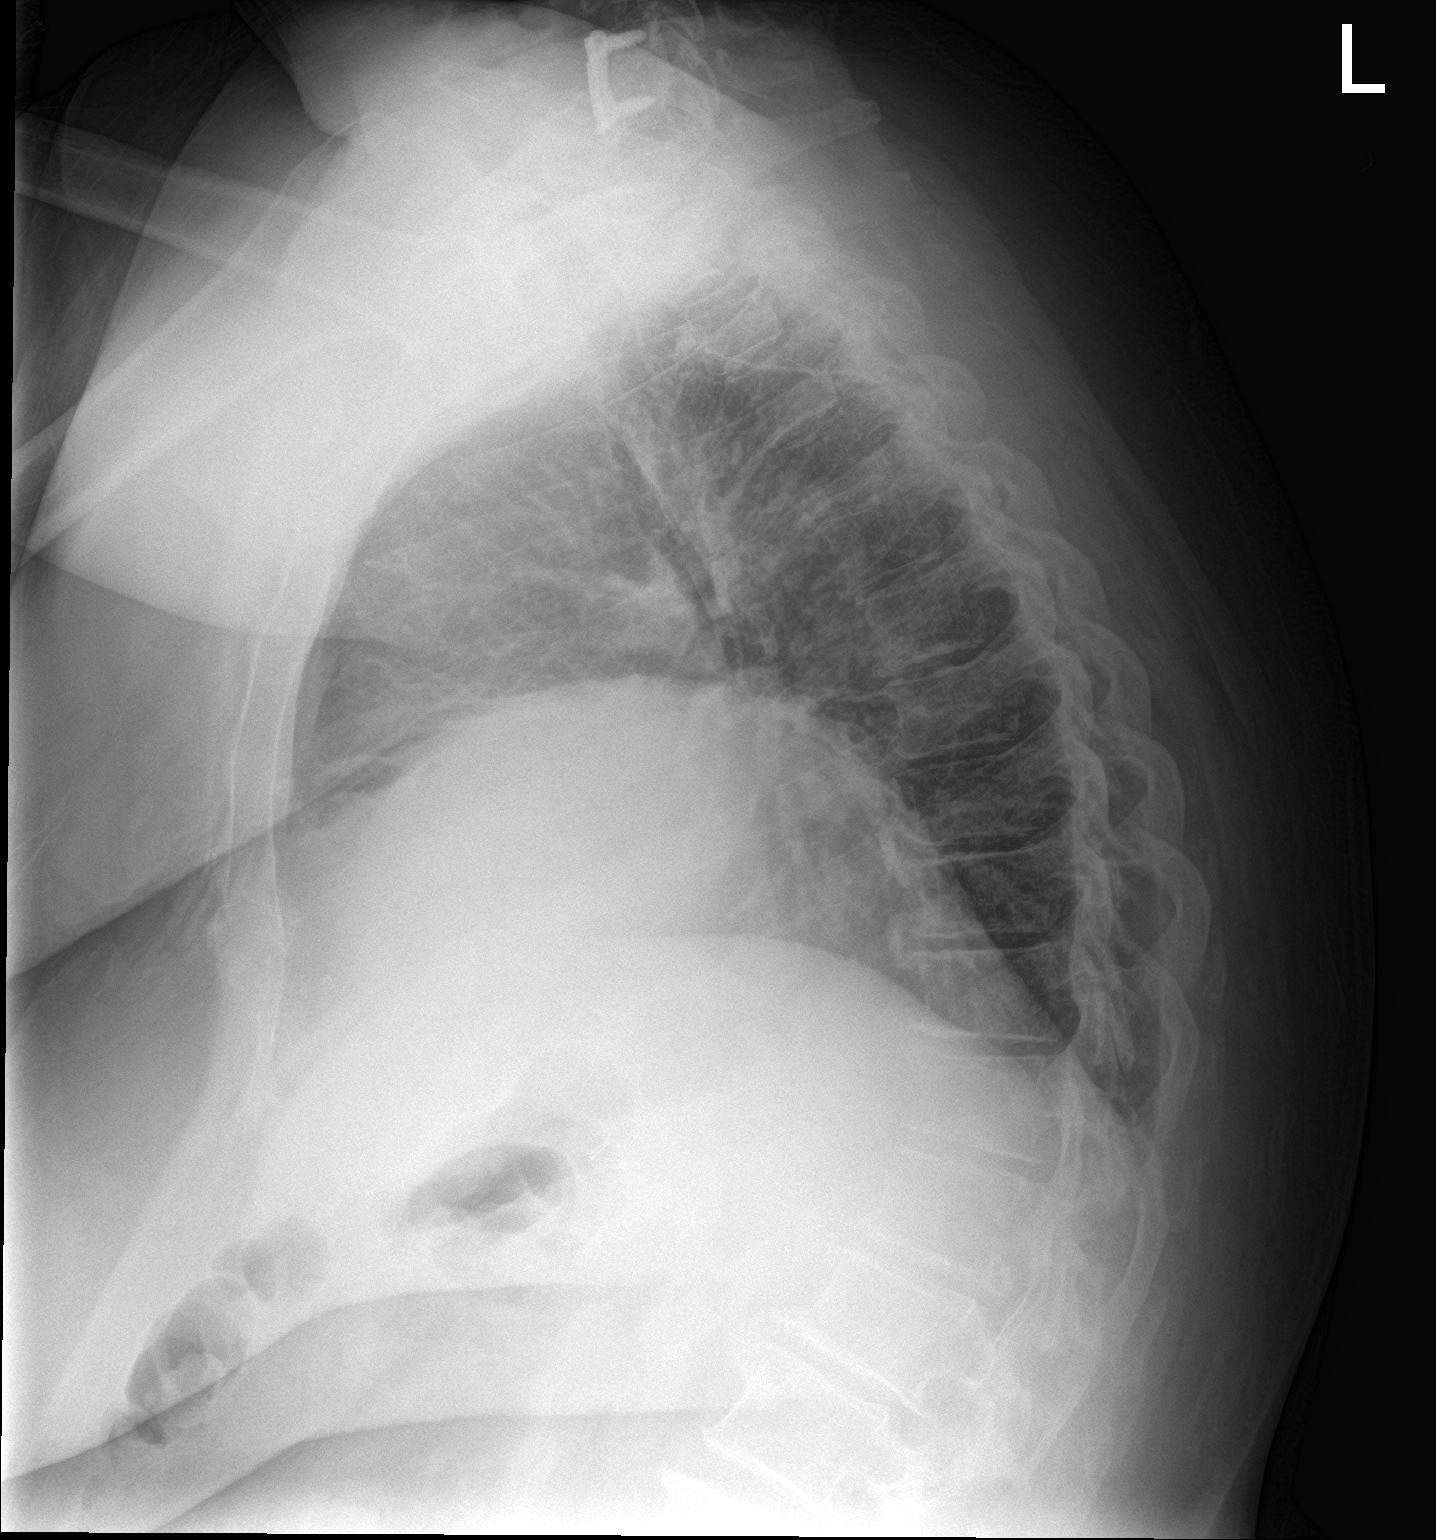

[2 of 2 positions shown; findings below may reference images not displayed]

FINDINGS: There is chronic elevation of the right hemidiaphragm. There is
patchy increased interstitial density in the right mid and lower
lung. The left lung is adequately inflated and clear. There is no
pleural effusion. The heart and pulmonary vascularity are normal.
The mediastinum is normal in width. There is mild multilevel
degenerative disc disease of the thoracic spine.
IMPRESSION: Subsegmental atelectasis or early pneumonia in the right mid and
lower lung. No CHF. Chronic elevation of the right hemidiaphragm.
Followup PA and lateral chest X-ray is recommended in 3-4 weeks
following trial of antibiotic therapy to ensure resolution and
exclude underlying malignancy.

## 2018-07-12 ENCOUNTER — Encounter: Payer: Medicare Other | Admitting: Family Medicine

## 2018-08-08 ENCOUNTER — Other Ambulatory Visit (HOSPITAL_COMMUNITY)
Admission: RE | Admit: 2018-08-08 | Discharge: 2018-08-08 | Disposition: A | Discharge status: Home / Self Care | Payer: Medicare Other | Source: Ambulatory Visit | Attending: Family Medicine | Admitting: Family Medicine

## 2018-08-08 ENCOUNTER — Encounter: Payer: Self-pay | Admitting: Family Medicine

## 2018-08-08 ENCOUNTER — Ambulatory Visit (INDEPENDENT_AMBULATORY_CARE_PROVIDER_SITE_OTHER): Payer: Medicare Other | Admitting: Family Medicine

## 2018-08-08 VITALS — BP 126/74 | HR 99 | Temp 98.3°F | Ht 64.25 in | Wt 242.8 lb

## 2018-08-08 DIAGNOSIS — D649 Anemia, unspecified: Secondary | ICD-10-CM | POA: Diagnosis not present

## 2018-08-08 DIAGNOSIS — Z1239 Encounter for other screening for malignant neoplasm of breast: Secondary | ICD-10-CM | POA: Diagnosis not present

## 2018-08-08 DIAGNOSIS — Z23 Encounter for immunization: Secondary | ICD-10-CM | POA: Diagnosis not present

## 2018-08-08 DIAGNOSIS — N393 Stress incontinence (female) (male): Secondary | ICD-10-CM | POA: Diagnosis not present

## 2018-08-08 DIAGNOSIS — N898 Other specified noninflammatory disorders of vagina: Secondary | ICD-10-CM

## 2018-08-08 DIAGNOSIS — R8290 Unspecified abnormal findings in urine: Secondary | ICD-10-CM | POA: Diagnosis not present

## 2018-08-08 DIAGNOSIS — I5022 Chronic systolic (congestive) heart failure: Secondary | ICD-10-CM

## 2018-08-08 DIAGNOSIS — N644 Mastodynia: Secondary | ICD-10-CM

## 2018-08-08 DIAGNOSIS — Z5181 Encounter for therapeutic drug level monitoring: Secondary | ICD-10-CM

## 2018-08-08 DIAGNOSIS — Z0000 Encounter for general adult medical examination without abnormal findings: Secondary | ICD-10-CM | POA: Insufficient documentation

## 2018-08-08 MED ORDER — FLUTICASONE PROPIONATE 50 MCG/ACT NA SUSP: 2.0000 | Freq: Every day | NASAL | 3 refills | Status: DC

## 2018-08-08 MED ORDER — TIZANIDINE HCL 2 MG PO TABS: ORAL_TABLET | ORAL | 3 refills | Status: DC

## 2018-08-08 MED ORDER — TRIAMCINOLONE ACETONIDE 0.1 % EX CREA: 1.0000 "application " | TOPICAL_CREAM | Freq: Two times a day (BID) | CUTANEOUS | 0 refills | Status: DC

## 2018-08-08 NOTE — Assessment & Plan Note (Signed)
Check TSH, lipids

## 2018-08-08 NOTE — Assessment & Plan Note (Signed)
USPSTF grade A and B recommendations reviewed with patient; age-appropriate recommendations, preventive care, screening tests, etc discussed and encouraged; healthy living encouraged; see AVS for patient education given to patient  

## 2018-08-08 NOTE — Assessment & Plan Note (Signed)
Check CBC today.  

## 2018-08-08 NOTE — Assessment & Plan Note (Signed)
Check labs today.

## 2018-08-08 NOTE — Progress Notes (Signed)
BP 126/74   Pulse 99   Temp 98.3 F (36.8 C) (Oral)   Ht 5' 4.25" (1.632 m)   Wt 242 lb 12.8 oz (110.1 kg)   SpO2 96%   BMI 41.35 kg/m    Subjective:    Patient ID: Patricia Fischer, female    DOB: Jul 09, 1956, 62 y.o.   MRN: [id number]  HPI: Patricia Fischer is a 62 y.o. female  Chief Complaint  Patient presents with  . Annual Exam    HPI  USPSTF grade A and B recommendations Depression:  Depression screen [facility] 2/9 08/08/2018 07/11/2017 06/26/2017 08/04/2016 07/27/2016  Decreased Interest 0 0 0 0 0  Down, Depressed, Hopeless 0 0 0 0 0  PHQ - 2 Score 0 0 0 0 0  Altered sleeping 0 - - - -  Tired, decreased energy 0 - - - -  Change in appetite 0 - - - -  Feeling bad or failure about yourself  0 - - - -  Trouble concentrating 0 - - - -  Moving slowly or fidgety/restless 0 - - - -  Suicidal thoughts 0 - - - -  PHQ-9 Score 0 - - - -  Difficult doing work/chores Not difficult at all - - - -   Hypertension: BP Readings from Last 3 Encounters:  08/08/18 126/74  05/30/18 122/76  01/23/18 124/80   Obesity: Wt Readings from Last 3 Encounters:  08/08/18 242 lb 12.8 oz (110.1 kg)  05/30/18 242 lb 6.4 oz (110 kg)  01/23/18 244 lb (110.7 kg)   BMI Readings from Last 3 Encounters:  08/08/18 41.35 kg/m  05/30/18 40.97 kg/m  01/23/18 41.24 kg/m    Skin cancer: using sunscreen; spot on left leg, present for years; not getting bigger or bleeding Lung cancer:  Never smoker, just 2nd hand smoke Breast cancer: hx, no current lumps or bumps; order placed; sometimes tender over the 10 o'clock position of the LEFT breast; always the same spot; they have been in there twice; comes and goes; no palpable lumps Colorectal cancer: done in 2010; father had cancer of rectum; she does not want a colonoscopy because of the prep; she opts to just wait; declined stool cards Cervical cancer screening: UTD< negative, next year due BRCA gene screening: family hx of breast and/or ovarian  cancer and/or metastatic prostate cancer? Aunts have had breast cancer (paternal side), father had rectal cancer; she thinks they did the genetics testing HIV, hep B, hep C: already done in January STD testing and prevention (chl/gon/syphilis): no sx Intimate partner violence: no abuse Contraception: n/a Osteoporosis: never told thin bones; start at 38 now Fall prevention/vitamin D: discussed Immunizations: UTD, shingrix not covered Diet: really eating healthy; air fryer, no fast food Exercise: tries to walk daily Alcohol:    [facility] Visit from 08/08/2018 in [facility]  AUDIT-C Score  1      Tobacco use: never AAA: n/a Aspirin: on Xarelto  The 10-year ASCVD risk score Mikey Bussing DC Brooke Bonito., et al., 2013) is: 6%   Values used to calculate the score:     Age: 82 years     Sex: Female     Is Non-Hispanic African American: No     Diabetic: No     Tobacco smoker: No     Systolic Blood Pressure: 496 mmHg     Is BP treated: Yes     HDL Cholesterol: 43 mg/dL     Total Cholesterol: 169 mg/dL  Glucose:  Glucose  Date Value Ref Range Status  01/23/2018 77 65 - 99 mg/dL Final   Glucose, Bld  Date Value Ref Range Status  08/08/2018 106 (H) 65 - 99 mg/dL Final    Comment:    .            Fasting reference interval . For someone without known diabetes, a glucose value between 100 and 125 mg/dL is consistent with prediabetes and should be confirmed with a follow-up test. .   09/08/2017 125 (H) 65 - 99 mg/dL Final  09/07/2017 148 (H) 65 - 99 mg/dL Final   Lipids:  Lab Results  Component Value Date   CHOL 169 08/08/2018   CHOL 172 07/11/2017   CHOL 125 05/17/2016   Lab Results  Component Value Date   HDL 43 (L) 08/08/2018   HDL 40 (L) 07/11/2017   HDL 51 05/17/2016   Lab Results  Component Value Date   LDLCALC 104 (H) 08/08/2018   LDLCALC 104 (H) 07/11/2017   LDLCALC 56 05/17/2016   Lab Results  Component Value Date   TRIG 120 08/08/2018   TRIG 161  (H) 07/11/2017   TRIG 92 05/17/2016   Lab Results  Component Value Date   CHOLHDL 3.9 08/08/2018   CHOLHDL 4.3 07/11/2017   CHOLHDL 2.5 05/17/2016   No results found for: LDLDIRECT   Depression screen [facility] 2/9 08/08/2018 07/11/2017 06/26/2017 08/04/2016 07/27/2016  Decreased Interest 0 0 0 0 0  Down, Depressed, Hopeless 0 0 0 0 0  PHQ - 2 Score 0 0 0 0 0  Altered sleeping 0 - - - -  Tired, decreased energy 0 - - - -  Change in appetite 0 - - - -  Feeling bad or failure about yourself  0 - - - -  Trouble concentrating 0 - - - -  Moving slowly or fidgety/restless 0 - - - -  Suicidal thoughts 0 - - - -  PHQ-9 Score 0 - - - -  Difficult doing work/chores Not difficult at all - - - -   Fall Risk  08/08/2018 07/11/2017 06/26/2017 08/04/2016 07/27/2016  Falls in the past year? 0 Yes Yes Yes Yes  Comment - - - - -  Number falls in past yr: 0 '1 1 2 ' or more 2 or more  Injury with Fall? - No No No No  Risk Factor Category  - - - - -  Risk for fall due to : - - - - -  Risk for fall due to: Comment - - - - -  Follow up - - - - -    Relevant past medical, surgical, family and social history reviewed Past Medical History:  Diagnosis Date  . Anxiety   . Asthma   . Blood transfusion [place]  . Breast cancer ([place]) 04/26/2008   left breast  . Breast cancer, left breast ([place]) 2011   DCIS  . Chronic migraine without aura, with intractable migraine, so stated, with status migrainosus March 2016  . Chronic systolic CHF (congestive heart failure) ([place]) 05/05/2015   a. Dx 04/2015 - EF initially 25-30% with RV dysfunction, f/u echo 08/2015 technically difficult, EF 35-40%, anterior, anteroseptal, apical and inferoapical severe hypokinesis suggestive of LAD territory ischemia/infarct, grade 1 DD, mild MR, RV normal.  . Depression   . DVT (deep venous thrombosis) ([place]) Aug. 2016   a. Dx 04/2015 - patient returned 05/2015 after noncompliance with Xarelto.  . Environmental allergies   .  Essential hypertension   . Family history of adverse reaction to anesthesia    " My Mother would get deathly sick "  . GERD (gastroesophageal reflux disease)   . Head injury 2011  . Hemorrhoids   . LBBB (left bundle branch block)   . Obesity   . Orthostatic hypotension   . Pneumonia 2012  . 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 Dx 04/2015 - patient returned 05/2015 after noncompliance with Xarelto.  . Shortness of breath dyspnea   . Syncope    a. Recurrent syncope in 2016 felt 2/2 orthostasis in setting of PE.   Past Surgical History:  Procedure Laterality Date  . ABDOMINAL HYSTERECTOMY  1986  . ANAL FISSURE REPAIR    . ANTERIOR CERVICAL DECOMP/DISCECTOMY FUSION  01/23/2012   Procedure: ANTERIOR CERVICAL DECOMPRESSION/DISCECTOMY FUSION 1 LEVEL;  Surgeon: Elaina Hoops, MD;  Location: [place] [facility];  Service: Neurosurgery;  Laterality: N/A;  Cervical five-six anterior cervical discectomy fusion with plating  . APPENDECTOMY    . BREAST BIOPSY Left 06/2016   US biopsy  . BREAST SURGERY Left 2011   Breast lumpectomy  . CARDIAC CATHETERIZATION  2010   [facility]; pt states it was "clear"  . CARDIAC CATHETERIZATION N/A 10/28/2015   Procedure: Right/Left Heart Cath and Coronary Angiography;  Surgeon: Belva Crome, MD;  Location: [place] CV LAB;  Service: Cardiovascular;  Laterality: N/A;  . CARPAL TUNNEL RELEASE Bilateral   . CESAREAN SECTION  1982, 1979  . CHOLECYSTECTOMY    . COLONOSCOPY  01/16/2009   Dr Bary Castilla  . KNEE CARTILAGE SURGERY Left   . SHOULDER ARTHROSCOPY WITH SUBACROMIAL DECOMPRESSION, ROTATOR CUFF REPAIR AND BICEP TENDON REPAIR Right 11/22/2013   Procedure: RIGHT SHOULDER ATHROSCOPY OPEN SUBSCAP REPAIR DELTA-PECTORAL ;  Surgeon: Augustin Schooling, MD;  Location: [place];  Service: Orthopedics;  Laterality: Right;  . TARSAL TUNNEL RELEASE Left 1990  . TONSILLECTOMY    . TUBAL LIGATION     Family History  Problem Relation Age of Onset  . Breast cancer  Paternal Aunt   . Breast cancer Mother 58  . Migraines Mother   . Cancer Mother   . Hypertension Mother   . Rectal cancer Father   . Cancer Father   . COPD Father   . Heart disease Paternal Grandmother   . Stroke Paternal Grandmother   . Heart attack Paternal Grandmother   . Aneurysm Paternal Grandfather   . COPD Paternal Grandfather   . Breast cancer Cousin   . Breast cancer Cousin   . Breast cancer Cousin   . Anesthesia problems Neg Hx   . Diabetes Neg Hx    Social History   Tobacco Use  . Smoking status: Never Smoker  . Smokeless tobacco: Never Used  Substance Use Topics  . Alcohol use: Yes    Comment: occasional wine  . Drug use: No     Office Visit from 08/08/2018 in [facility]  AUDIT-C Score  1      Interim medical history since last visit reviewed. Allergies and medications reviewed  Review of Systems  Genitourinary: Positive for frequency and urgency.       Urine odor; leakage with sneezing   Per HPI unless specifically indicated above     Objective:    BP 126/74   Pulse 99   Temp 98.3 F (36.8 C) (Oral)   Ht 5' 4.25" (1.632 m)   Wt 242 lb 12.8 oz (110.1 kg)  SpO2 96%   BMI 41.35 kg/m   Wt Readings from Last 3 Encounters:  08/08/18 242 lb 12.8 oz (110.1 kg)  05/30/18 242 lb 6.4 oz (110 kg)  01/23/18 244 lb (110.7 kg)    Physical Exam Constitutional:      Appearance: Normal appearance. She is well-developed and well-nourished.  HENT:     Head: Normocephalic and atraumatic.     Right Ear: Hearing, tympanic membrane, ear canal and external ear normal.     Left Ear: Hearing, tympanic membrane, ear canal and external ear normal.  Eyes:     General: No scleral icterus.       Right eye: No hordeolum.        Left eye: No hordeolum.     Extraocular Movements: EOM normal.     Conjunctiva/sclera: Conjunctivae normal.  Neck:     Thyroid: No thyromegaly.     Vascular: No carotid bruit.  Cardiovascular:     Rate and Rhythm:  Normal rate and regular rhythm.  No extrasystoles are present.    Heart sounds: Normal heart sounds, S1 normal and S2 normal.  Pulmonary:     Effort: Pulmonary effort is normal. No respiratory distress.     Breath sounds: Normal breath sounds.  Chest:     Breasts: Breasts are symmetrical.        Right: No inverted nipple, mass, nipple discharge, skin change or tenderness.        Left: Tenderness present. No inverted nipple, mass, nipple discharge or skin change.  Abdominal:     General: Bowel sounds are normal. There is no distension or abdominal bruit.     Palpations: Abdomen is soft. There is no hepatosplenomegaly, mass or pulsatile mass.     Tenderness: There is no abdominal tenderness.     Hernia: No hernia is present.  Musculoskeletal: Normal range of motion.        General: No edema.  Lymphadenopathy:     Head:     Right side of head: No submandibular adenopathy.     Left side of head: No submandibular adenopathy.     Cervical: No cervical adenopathy.     Upper Body:  No axillary adenopathy present. Skin:    General: Skin is warm and dry.     Coloration: Skin is not pale.     Findings: No bruising or ecchymosis.     Nails: There is no cyanosis.   Neurological:     Mental Status: She is alert.     Cranial Nerves: No cranial nerve deficit.     Motor: No tremor or abnormal muscle tone.     Gait: Gait normal.     Deep Tendon Reflexes:     Reflex Scores:      Patellar reflexes are 2+ on the right side and 2+ on the left side. Psychiatric:        Mood and Affect: Mood is not anxious or depressed.        Speech: Speech normal.        Behavior: Behavior normal.        Thought Content: Thought content normal.       Assessment & Plan:   Problem List Items Addressed This Visit      Cardiovascular and Mediastinum   Chronic systolic CHF (congestive heart failure) ([place])    Managed by cardiologist; no evidence of fluid overload today        Other   Preventative health  care - Primary  USPSTF grade A and B recommendations reviewed with patient; age-appropriate recommendations, preventive care, screening tests, etc discussed and encouraged; healthy living encouraged; see AVS for patient education given to patient       Relevant Orders   CBC with Differential/Platelet (Completed)   COMPLETE METABOLIC PANEL WITH GFR (Completed)   Lipid panel (Completed)   TSH (Completed)   Morbid obesity (HCC)    Check TSH, lipids      Relevant Orders   Lipid panel   TSH   Medication monitoring encounter    Check labs today      Relevant Orders   CBC with Differential/Platelet   COMPLETE METABOLIC PANEL WITH GFR   Anemia    Check CBC today      Relevant Orders   CBC with Differential/Platelet    Other Visit Diagnoses    Need for influenza vaccination       Relevant Orders   Flu Vaccine QUAD 6+ mos PF IM (Fluarix Quad PF) (Completed)   Screening for breast cancer       Abnormal urine odor       Relevant Orders   Urinalysis w microscopic + reflex cultur   Stress incontinence       Relevant Orders   Urinalysis w microscopic + reflex cultur   Vaginal odor       Relevant Orders   Cervicovaginal ancillary only (Completed)   Breast pain, left       Relevant Orders   MM DIAG BREAST TOMO BILATERAL   US BREAST LTD UNI LEFT INC AXILLA   US BREAST LTD UNI RIGHT INC AXILLA       Follow up plan: Return in about 1 year (around 08/09/2019) for complete physical.  An after-visit summary was printed and given to the patient at [place].  Please see the patient instructions which may contain other information and recommendations beyond what is mentioned above in the assessment and plan.  Meds ordered this encounter  Medications  . tiZANidine (ZANAFLEX) 2 MG tablet    Sig: One by mouth every eight hours if needed    Dispense:  90 tablet    Refill:  3  . fluticasone (FLONASE) 50 MCG/ACT nasal spray    Sig: Place 2 sprays into both nostrils daily.     Dispense:  48 g    Refill:  3  . triamcinolone cream (KENALOG) 0.1 %    Sig: Apply 1 application topically 2 (two) times daily.    Dispense:  30 g    Refill:  0    Orders Placed This Encounter  Procedures  . MM DIAG BREAST TOMO BILATERAL  . US BREAST LTD UNI LEFT INC AXILLA  . US BREAST LTD UNI RIGHT INC AXILLA  . Flu Vaccine QUAD 6+ mos PF IM (Fluarix Quad PF)  . CBC with Differential/Platelet  . COMPLETE METABOLIC PANEL WITH GFR  . Lipid panel  . TSH  . Urinalysis w microscopic + reflex cultur  . CBC with Differential/Platelet  . COMPLETE METABOLIC PANEL WITH GFR  . Lipid panel  . TSH  . Urinalysis w microscopic + reflex cultur  . REFLEXIVE URINE CULTURE

## 2018-08-08 NOTE — Patient Instructions (Addendum)
Keep a close eye on the place on your left lower leg; I do recommend that you see a dermatologist to have biopsied in case it is a type of skin cancer; let me know if I can help with a referral    Check out the information at familydoctor.org entitled "Nutrition for Weight Loss: What You Need to Know about Fad Diets" Try to lose between 1-2 pounds per week by taking in fewer calories and burning off more calories You can succeed by limiting portions, limiting foods dense in calories and fat, becoming more active, and drinking 8 glasses of water a day (64 ounces) Don't skip meals, especially breakfast, as skipping meals may alter your metabolism Do not use over-the-counter weight loss pills or gimmicks that claim rapid weight loss A healthy BMI (or body mass index) is between 18.5 and 24.9 You can calculate your ideal BMI at the [place] website ClubMonetize.fr    Preventing Unhealthy Weight Gain, Adult Staying at a healthy weight is important. When fat builds up in your body, you may become overweight or obese. These conditions put you at greater risk for developing certain health problems, such as heart disease, diabetes, sleeping problems, joint problems, and some cancers. Unhealthy weight gain is often the result of making unhealthy choices in what you eat. It is also a result of not getting enough exercise. You can make changes to your lifestyle to prevent obesity and stay as healthy as possible. What nutrition changes can be made? To maintain a healthy weight and prevent obesity:  Eat only as much as your body needs. To do this: ? Pay attention to signs that you are hungry or full. Stop eating as soon as you feel full. ? If you feel hungry, try drinking water first. Drink enough water so your urine is clear or pale yellow. ? Eat smaller portions. ? Look at serving sizes on food labels. Most foods contain more than one serving per  container. ? Eat the recommended amount of calories for your gender and activity level. While most active people should eat around 2,000 calories per day, if you are trying to lose weight or are not very active, you main need to eat less calories. Talk to your health care provider or dietitian about how many calories you should eat each day.  Choose healthy foods, such as: ? Fruits and vegetables. Try to fill at least half of your plate at each meal with fruits and vegetables. ? Whole grains, such as whole wheat bread, brown rice, and quinoa. ? Lean meats, such as chicken or fish. ? Other healthy proteins, such as beans, eggs, or tofu. ? Healthy fats, such as nuts, seeds, fatty fish, and olive oil. ? Low-fat or fat-free dairy.  Check food labels and avoid food and drinks that: ? Are high in calories. ? Have added sugar. ? Are high in sodium. ? Have saturated fats or trans fats.  Limit how much you eat of the following foods: ? Prepackaged meals. ? Fast food. ? Fried foods. ? Processed meat, such as bacon, sausage, and deli meats. ? Fatty cuts of red meat and poultry with skin.  Cook foods in healthier ways, such as by baking, broiling, or grilling.  When grocery shopping, try to shop around the outside of the store. This helps you buy mostly fresh foods and avoid canned and prepackaged foods.  What lifestyle changes can be made?  Exercise at least 30 minutes 5 or more days each week. Exercising includes  brisk walking, yard work, biking, running, swimming, and team sports like basketball and soccer. Ask your health care provider which exercises are safe for you.  Do not use any products that contain nicotine or tobacco, such as cigarettes and e-cigarettes. If you need help quitting, ask your health care provider.  Limit alcohol intake to no more than 1 drink a day for nonpregnant women and 2 drinks a day for men. One drink equals 12 oz of beer, 5 oz of wine, or 1 oz of hard  liquor.  Try to get 7-9 hours of sleep each night. What other changes can be made?  Keep a food and activity journal to keep track of: ? What you ate and how many calories you had. Remember to count sauces, dressings, and side dishes. ? Whether you were active, and what exercises you did. ? Your calorie, weight, and activity goals.  Check your weight regularly. Track any changes. If you notice you have gained weight, make changes to your diet or activity routine.  Avoid taking weight-loss medicines or supplements. Talk to your health care provider before starting any new medicine or supplement.  Talk to your health care provider before trying any new diet or exercise plan. Why are these changes important? Eating healthy, staying active, and having healthy habits not only help prevent obesity, they also:  Help you to manage stress and emotions.  Help you to connect with friends and family.  Improve your self-esteem.  Improve your sleep.  Prevent long-term health problems.  What can happen if changes are not made? Being obese or overweight can cause you to develop joint or bone problems, which can make it hard for you to stay active or do activities you enjoy. Being obese or overweight also puts stress on your heart and lungs and can lead to health problems like diabetes, heart disease, and some cancers. Where to find more information: Talk with your health care provider or a dietitian about healthy eating and healthy lifestyle choices. You may also find other information through these resources:  U.S. Department of Agriculture MyPlate: FormerBoss.no  American Heart Association: www.heart.org  Centers for Disease Control and Prevention: http://www.wolf.info/  Summary  Staying at a healthy weight is important. It helps prevent certain diseases and health problems, such as heart disease, diabetes, joint problems, sleep disorders, and some cancers.  Being obese or overweight can  cause you to develop joint or bone problems, which can make it hard for you to stay active or do activities you enjoy.  You can prevent unhealthy weight gain by eating a healthy diet, exercising regularly, not smoking, limiting alcohol, and getting enough sleep.  Talk with your health care provider or a dietitian for guidance about healthy eating and healthy lifestyle choices. This information is not intended to replace advice given to you by your health care provider. Make sure you discuss any questions you have with your health care provider. Document Released: 08/23/2016 Document Revised: 09/28/2016 Document Reviewed: 09/28/2016 Elsevier Interactive Patient Education  2018 [place] Maintenance, Female Adopting a healthy lifestyle and getting preventive care can go a long way to promote health and wellness. Talk with your health care provider about what schedule of regular examinations is right for you. This is a good chance for you to check in with your provider about disease prevention and staying healthy. In between checkups, there are plenty of things you can do on your own. Experts have done a lot of research about which lifestyle  changes and preventive measures are most likely to keep you healthy. Ask your health care provider for more information. Weight and diet Eat a healthy diet  Be sure to include plenty of vegetables, fruits, low-fat dairy products, and lean protein.  Do not eat a lot of foods high in solid fats, added sugars, or salt.  Get regular exercise. This is one of the most important things you can do for your health. ? Most adults should exercise for at least 150 minutes each week. The exercise should increase your heart rate and make you sweat (moderate-intensity exercise). ? Most adults should also do strengthening exercises at least twice a week. This is in addition to the moderate-intensity exercise.  Maintain a healthy weight  Body mass index (BMI) is  a measurement that can be used to identify possible weight problems. It estimates body fat based on height and weight. Your health care provider can help determine your BMI and help you achieve or maintain a healthy weight.  For females 69 years of age and older: ? A BMI below 18.5 is considered underweight. ? A BMI of 18.5 to 24.9 is normal. ? A BMI of 25 to 29.9 is considered overweight. ? A BMI of 30 and above is considered obese.  Watch levels of cholesterol and blood lipids  You should start having your blood tested for lipids and cholesterol at 63 years of age, then have this test every 5 years.  You may need to have your cholesterol levels checked more often if: ? Your lipid or cholesterol levels are high. ? You are older than 63 years of age. ? You are at high risk for heart disease.  Cancer screening Lung Cancer  Lung cancer screening is recommended for adults 39-54 years old who are at high risk for lung cancer because of a history of smoking.  A yearly low-dose CT scan of the lungs is recommended for people who: ? Currently smoke. ? Have quit within the past 15 years. ? Have at least a 30-pack-year history of smoking. A pack year is smoking an average of one pack of cigarettes a day for 1 year.  Yearly screening should continue until it has been 15 years since you quit.  Yearly screening should stop if you develop a health problem that would prevent you from having lung cancer treatment.  Breast Cancer  Practice breast self-awareness. This means understanding how your breasts normally appear and feel.  It also means doing regular breast self-exams. Let your health care provider know about any changes, no matter how small.  If you are in your 20s or 30s, you should have a clinical breast exam (CBE) by a health care provider every 1-3 years as part of a regular health exam.  If you are 67 or older, have a CBE every year. Also consider having a breast X-ray (mammogram)  every year.  If you have a family history of breast cancer, talk to your health care provider about genetic screening.  If you are at high risk for breast cancer, talk to your health care provider about having an MRI and a mammogram every year.  Breast cancer gene (BRCA) assessment is recommended for women who have family members with BRCA-related cancers. BRCA-related cancers include: ? Breast. ? Ovarian. ? Tubal. ? Peritoneal cancers.  Results of the assessment will determine the need for genetic counseling and BRCA1 and BRCA2 testing.  Cervical Cancer Your health care provider may recommend that you be screened regularly for  cancer of the pelvic organs (ovaries, uterus, and vagina). This screening involves a pelvic examination, including checking for microscopic changes to the surface of your cervix (Pap test). You may be encouraged to have this screening done every 3 years, beginning at age 54.  For women ages 31-65, health care providers may recommend pelvic exams and Pap testing every 3 years, or they may recommend the Pap and pelvic exam, combined with testing for human papilloma virus (HPV), every 5 years. Some types of HPV increase your risk of cervical cancer. Testing for HPV may also be done on women of any age with unclear Pap test results.  Other health care providers may not recommend any screening for nonpregnant women who are considered low risk for pelvic cancer and who do not have symptoms. Ask your health care provider if a screening pelvic exam is right for you.  If you have had past treatment for cervical cancer or a condition that could lead to cancer, you need Pap tests and screening for cancer for at least 20 years after your treatment. If Pap tests have been discontinued, your risk factors (such as having a new sexual partner) need to be reassessed to determine if screening should resume. Some women have medical problems that increase the chance of getting cervical  cancer. In these cases, your health care provider may recommend more frequent screening and Pap tests.  Colorectal Cancer  This type of cancer can be detected and often prevented.  Routine colorectal cancer screening usually begins at 63 years of age and continues through 63 years of age.  Your health care provider may recommend screening at an earlier age if you have risk factors for colon cancer.  Your health care provider may also recommend using home test kits to check for hidden blood in the stool.  A small camera at the end of a tube can be used to examine your colon directly (sigmoidoscopy or colonoscopy). This is done to check for the earliest forms of colorectal cancer.  Routine screening usually begins at age 73.  Direct examination of the colon should be repeated every 5-10 years through 63 years of age. However, you may need to be screened more often if early forms of precancerous polyps or small growths are found.  Skin Cancer  Check your skin from head to toe regularly.  Tell your health care provider about any new moles or changes in moles, especially if there is a change in a mole's shape or color.  Also tell your health care provider if you have a mole that is larger than the size of a pencil eraser.  Always use sunscreen. Apply sunscreen liberally and repeatedly throughout the day.  Protect yourself by wearing long sleeves, pants, a wide-brimmed hat, and sunglasses whenever you are outside.  Heart disease, diabetes, and high blood pressure  High blood pressure causes heart disease and increases the risk of stroke. High blood pressure is more likely to develop in: ? People who have blood pressure in the high end of the normal range (130-139/85-89 mm Hg). ? People who are overweight or obese. ? People who are African American.  If you are 5-78 years of age, have your blood pressure checked every 3-5 years. If you are 37 years of age or older, have your blood  pressure checked every year. You should have your blood pressure measured twice-once when you are at a hospital or clinic, and once when you are not at a hospital or clinic. Record  the average of the two measurements. To check your blood pressure when you are not at a hospital or clinic, you can use: ? An automated blood pressure machine at a pharmacy. ? A home blood pressure monitor.  If you are between 67 years and 36 years old, ask your health care provider if you should take aspirin to prevent strokes.  Have regular diabetes screenings. This involves taking a blood sample to check your fasting blood sugar level. ? If you are at a normal weight and have a low risk for diabetes, have this test once every three years after 63 years of age. ? If you are overweight and have a high risk for diabetes, consider being tested at a younger age or more often. Preventing infection Hepatitis B  If you have a higher risk for hepatitis B, you should be screened for this virus. You are considered at high risk for hepatitis B if: ? You were born in a country where hepatitis B is common. Ask your health care provider which countries are considered high risk. ? Your parents were born in a high-risk country, and you have not been immunized against hepatitis B (hepatitis B vaccine). ? You have HIV or AIDS. ? You use needles to inject street drugs. ? You live with someone who has hepatitis B. ? You have had sex with someone who has hepatitis B. ? You get hemodialysis treatment. ? You take certain medicines for conditions, including cancer, organ transplantation, and autoimmune conditions.  Hepatitis C  Blood testing is recommended for: ? Everyone born from [birth year] through 1966. ? Anyone with known risk factors for hepatitis C.  Sexually transmitted infections (STIs)  You should be screened for sexually transmitted infections (STIs) including gonorrhea and chlamydia if: ? You are sexually active and are  younger than 63 years of age. ? You are older than 63 years of age and your health care provider tells you that you are at risk for this type of infection. ? Your sexual activity has changed since you were last screened and you are at an increased risk for chlamydia or gonorrhea. Ask your health care provider if you are at risk.  If you do not have HIV, but are at risk, it may be recommended that you take a prescription medicine daily to prevent HIV infection. This is called pre-exposure prophylaxis (PrEP). You are considered at risk if: ? You are sexually active and do not regularly use condoms or know the HIV status of your partner(s). ? You take drugs by injection. ? You are sexually active with a partner who has HIV.  Talk with your health care provider about whether you are at high risk of being infected with HIV. If you choose to begin PrEP, you should first be tested for HIV. You should then be tested every 3 months for as long as you are taking PrEP. Pregnancy  If you are premenopausal and you may become pregnant, ask your health care provider about preconception counseling.  If you may become pregnant, take 400 to 800 micrograms (mcg) of folic acid every day.  If you want to prevent pregnancy, talk to your health care provider about birth control (contraception). Osteoporosis and menopause  Osteoporosis is a disease in which the bones lose minerals and strength with aging. This can result in serious bone fractures. Your risk for osteoporosis can be identified using a bone density scan.  If you are 20 years of age or older, or if you are  at risk for osteoporosis and fractures, ask your health care provider if you should be screened.  Ask your health care provider whether you should take a calcium or vitamin D supplement to lower your risk for osteoporosis.  Menopause may have certain physical symptoms and risks.  Hormone replacement therapy may reduce some of these symptoms and  risks. Talk to your health care provider about whether hormone replacement therapy is right for you. Follow these instructions at home:  Schedule regular health, dental, and eye exams.  Stay current with your immunizations.  Do not use any tobacco products including cigarettes, chewing tobacco, or electronic cigarettes.  If you are pregnant, do not drink alcohol.  If you are breastfeeding, limit how much and how often you drink alcohol.  Limit alcohol intake to no more than 1 drink per day for nonpregnant women. One drink equals 12 ounces of beer, 5 ounces of wine, or 1 ounces of hard liquor.  Do not use street drugs.  Do not share needles.  Ask your health care provider for help if you need support or information about quitting drugs.  Tell your health care provider if you often feel depressed.  Tell your health care provider if you have ever been abused or do not feel safe at home. This information is not intended to replace advice given to you by your health care provider. Make sure you discuss any questions you have with your health care provider. Document Released: 03/07/2011 Document Revised: 01/28/2016 Document Reviewed: 05/26/2015 Elsevier Interactive Patient Education  [employer].

## 2018-08-08 NOTE — Assessment & Plan Note (Signed)
Managed by cardiologist; no evidence of fluid overload today

## 2018-08-09 ENCOUNTER — Telehealth: Payer: Self-pay

## 2018-08-09 DIAGNOSIS — E7800 Pure hypercholesterolemia, unspecified: Secondary | ICD-10-CM

## 2018-08-09 LAB — URINALYSIS W MICROSCOPIC + REFLEX CULTURE
Bacteria, UA: NONE SEEN /HPF
Bilirubin Urine: NEGATIVE
Glucose, UA: NEGATIVE
Hgb urine dipstick: NEGATIVE
Hyaline Cast: NONE SEEN /LPF
Ketones, ur: NEGATIVE
Leukocyte Esterase: NEGATIVE
Nitrites, Initial: NEGATIVE
Protein, ur: NEGATIVE
RBC / HPF: NONE SEEN /HPF (ref 0–2)
Specific Gravity, Urine: 1.025 (ref 1.001–1.03)
WBC, UA: NONE SEEN /HPF (ref 0–5)
pH: 5.5 (ref 5.0–8.0)

## 2018-08-09 LAB — CBC WITH DIFFERENTIAL/PLATELET
BASOS ABS: 31 {cells}/uL (ref 0–200)
BASOS PCT: 0.4 %
EOS ABS: 172 {cells}/uL (ref 15–500)
EOS PCT: 2.2 %
HEMATOCRIT: 39.1 % (ref 35.0–45.0)
HEMOGLOBIN: 12.9 g/dL (ref 11.7–15.5)
LYMPHS ABS: 1802 {cells}/uL (ref 850–3900)
MCH: 28.3 pg (ref 27.0–33.0)
MCHC: 33 g/dL (ref 32.0–36.0)
MCV: 85.7 fL (ref 80.0–100.0)
MPV: 10.8 fL (ref 7.5–12.5)
Monocytes Relative: 6.9 %
NEUTROS ABS: 5257 {cells}/uL (ref 1500–7800)
Neutrophils Relative %: 67.4 %
Platelets: 190 10*3/uL (ref 140–400)
RBC: 4.56 10*6/uL (ref 3.80–5.10)
RDW: 13 % (ref 11.0–15.0)
Total Lymphocyte: 23.1 %
WBC mixed population: 538 cells/uL (ref 200–950)
WBC: 7.8 10*3/uL (ref 3.8–10.8)

## 2018-08-09 LAB — COMPLETE METABOLIC PANEL WITH GFR
AG Ratio: 1.3 (calc) (ref 1.0–2.5)
ALBUMIN MSPROF: 3.9 g/dL (ref 3.6–5.1)
ALT: 13 U/L (ref 6–29)
AST: 18 U/L (ref 10–35)
Alkaline phosphatase (APISO): 91 U/L (ref 33–130)
BUN: 13 mg/dL (ref 7–25)
CO2: 30 mmol/L (ref 20–32)
CREATININE: 0.81 mg/dL (ref 0.50–0.99)
Calcium: 9.1 mg/dL (ref 8.6–10.4)
Chloride: 104 mmol/L (ref 98–110)
GFR, EST AFRICAN AMERICAN: 90 mL/min/{1.73_m2} (ref >=60)
GFR, EST NON AFRICAN AMERICAN: 77 mL/min/{1.73_m2} (ref >=60)
Globulin: 2.9 g/dL (calc) (ref 1.9–3.7)
Glucose, Bld: 106 mg/dL — ABNORMAL HIGH (ref 65–99)
Potassium: 4.1 mmol/L (ref 3.5–5.3)
Sodium: 142 mmol/L (ref 135–146)
TOTAL PROTEIN: 6.8 g/dL (ref 6.1–8.1)
Total Bilirubin: 0.5 mg/dL (ref 0.2–1.2)

## 2018-08-09 LAB — TSH: TSH: 2.22 m[IU]/L (ref 0.40–4.50)

## 2018-08-09 LAB — LIPID PANEL
CHOL/HDL RATIO: 3.9 (calc) (ref <5.0)
Cholesterol: 169 mg/dL (ref <200)
HDL: 43 mg/dL — ABNORMAL LOW (ref >50)
LDL Cholesterol (Calc): 104 mg/dL (calc) — ABNORMAL HIGH
Non-HDL Cholesterol (Calc): 126 mg/dL (calc) (ref <130)
Triglycerides: 120 mg/dL (ref <150)

## 2018-08-09 LAB — CERVICOVAGINAL ANCILLARY ONLY
BACTERIAL VAGINITIS: NEGATIVE
Candida vaginitis: NEGATIVE
Chlamydia: NEGATIVE
Neisseria Gonorrhea: NEGATIVE
TRICH (WINDOWPATH): NEGATIVE

## 2018-08-09 LAB — NO CULTURE INDICATED

## 2018-08-09 NOTE — Progress Notes (Signed)
Patricia Fischer, please let the patient know that her vagina swab was negative for yeast, BV, trich, everything; completely normal; good news

## 2018-08-09 NOTE — Progress Notes (Signed)
Patricia Fischer, please let the patient know that her CBC is normal; glucose is a few points above normal, so keep trying to eat healthy, walk, lose weight; liver function tests are normal HDL cholesterol is a little better, not quite as high as we want it; her TG have improved and look great; her LDL is 104, which means her risk for a heart attack or stroke is about 6% over the next 10 years; I'll recommend a low dose statin to help bring her cholesterol panel under better control; it is up to her; if she wants to do the medicine, recheck lipids in 6 weeks (please ORDER); if she wants to try diet and walking and weight loss alone, then recheck in 3 months (please ORDER) She has calcium oxalate crystals, which means she may be developing kidney stones; encourage hydration, low salt diet, cut out red meat, increase fruits and vegetables, and avoid sugary drinks  The 10-year ASCVD risk score Patricia Bussing DC Brooke Bonito., et al., 2013) is: 6%   Values used to calculate the score:     Age: 62 years     Sex: Female     Is Non-Hispanic African American: No     Diabetic: No     Tobacco smoker: No     Systolic Blood Pressure: 419 mmHg     Is BP treated: Yes     HDL Cholesterol: 43 mg/dL     Total Cholesterol: 169 mg/dL

## 2018-08-09 NOTE — Telephone Encounter (Signed)
-----   Message from Arnetha Courser, MD sent at 08/09/2018  1:26 PM EST ----- Joelene Millin, please let the patient know that her CBC is normal; glucose is a few points above normal, so keep trying to eat healthy, walk, lose weight; liver function tests are normal HDL cholesterol is a little better, not quite as high as we want it; her TG have improved and look great; her LDL is 104, which means her risk for a heart attack or stroke is about 6% over the next 10 years; I'll recommend a low dose statin to help bring her cholesterol panel under better control; it is up to her; if she wants to do the medicine, recheck lipids in 6 weeks (please ORDER); if she wants to try diet and walking and weight loss alone, then recheck in 3 months (please ORDER) She has calcium oxalate crystals, which means she may be developing kidney stones; encourage hydration, low salt diet, cut out red meat, increase fruits and vegetables, and avoid sugary drinks  The 10-year ASCVD risk score Mikey Bussing DC Brooke Bonito., et al., 2013) is: 6%   Values used to calculate the score:     Age: 62 years     Sex: Female     Is Non-Hispanic African American: No     Diabetic: No     Tobacco smoker: No     Systolic Blood Pressure: 016 mmHg     Is BP treated: Yes     HDL Cholesterol: 43 mg/dL     Total Cholesterol: 169 mg/dL

## 2018-08-13 ENCOUNTER — Encounter: Payer: Medicare Other | Admitting: Family Medicine

## 2018-08-15 ENCOUNTER — Other Ambulatory Visit: Payer: Medicare Other

## 2018-08-17 NOTE — Telephone Encounter (Signed)
Pt.notified

## 2018-09-26 NOTE — Telephone Encounter (Signed)
I requested Rx for [facility]anidine [facility] local [facility] DENIED I sent plenty of refills of this drug to her [facility] in [place] They can be transferred from one [facility] to another if she has moved back It is too soon for a refill

## 2018-09-26 NOTE — Telephone Encounter (Signed)
Routing to close.  °

## 2018-10-12 ENCOUNTER — Other Ambulatory Visit: Payer: Self-pay | Admitting: Family Medicine

## 2018-10-12 MED ORDER — ALBUTEROL SULFATE HFA 108 (90 BASE) MCG/ACT IN AERS: 2.0000 | INHALATION_SPRAY | RESPIRATORY_TRACT | 1 refills | Status: DC | PRN

## 2018-10-12 NOTE — Telephone Encounter (Signed)
Copied from [place] [id number]. Topic: Quick Communication - Rx Refill/Question >> Oct 12, 2018 12:11 PM Wynetta Emery, [place] C wrote: Medication: albuterol (PROAIR HFA) 108 (90 Base) MCG/ACT inhaler  Has the patient contacted their pharmacy? Yes  (Agent: If no, request that the patient contact the pharmacy for the refill.) (Agent: If yes, when and what did the pharmacy advise?)  Preferred Pharmacy (with phone number or street name): [id number] [place], [place] [phone] (Phone) [phone] (Fax)    Agent: Please be advised that RX refills may take up to 3 business days. We ask that you follow-up with your pharmacy.

## 2018-11-05 ENCOUNTER — Other Ambulatory Visit: Payer: Self-pay | Admitting: Family Medicine

## 2018-11-06 NOTE — Telephone Encounter (Signed)
Too soon for refills of muscle relaxer DENIED

## 2018-12-31 ENCOUNTER — Other Ambulatory Visit: Payer: Self-pay | Admitting: Family Medicine

## 2019-01-16 ENCOUNTER — Other Ambulatory Visit: Payer: Self-pay | Admitting: Interventional Cardiology

## 2019-01-21 ENCOUNTER — Other Ambulatory Visit: Payer: Self-pay | Admitting: Interventional Cardiology

## 2019-02-05 ENCOUNTER — Ambulatory Visit: Payer: Medicare Other | Admitting: Physician Assistant

## 2019-04-10 ENCOUNTER — Other Ambulatory Visit: Payer: Self-pay | Admitting: Interventional Cardiology

## 2019-05-06 ENCOUNTER — Other Ambulatory Visit: Payer: Self-pay | Admitting: Family Medicine

## 2019-06-07 ENCOUNTER — Other Ambulatory Visit: Payer: Self-pay | Admitting: Family Medicine

## 2019-06-07 NOTE — Telephone Encounter (Signed)
Requested medication (s) are due for refill today:yes  Requested medication (s) are on the active medication list: yes  Last refill:  03/07/2019  Future visit scheduled: no  Notes to clinic:  Review for refill   Requested Prescriptions  Pending Prescriptions Disp Refills   fluticasone (FLONASE) 50 MCG/ACT nasal spray [Pharmacy Med Name: FLUTICASONE PROP 50 MCG SPRAY] 48 mL 3    Sig: INSTILL 1 SPRAY INTO EACH NOSTRIL EVERY DAY AS NEEDED     Ear, Nose, and Throat: Nasal Preparations - Corticosteroids Passed - 06/07/2019  7:19 AM      Passed - Valid encounter within last 12 months    Recent Outpatient Visits          10 months ago Preventative health care   [place], [place]   1 year ago Pneumonia of right middle lobe due to infectious organism [facility])   [facility], Satira Anis, MD   1 year ago Tachycardia   [facility], FNP   1 year ago Urinary frequency   [facility] Lada, Satira Anis, MD   1 year ago Preventative health care   [facility] Lada, Satira Anis, MD      Future Appointments            In 4 weeks Belva Crome, MD [facility]

## 2019-07-01 ENCOUNTER — Other Ambulatory Visit: Payer: Self-pay | Admitting: Interventional Cardiology

## 2019-07-02 ENCOUNTER — Telehealth: Payer: Self-pay | Admitting: Interventional Cardiology

## 2019-07-02 NOTE — Telephone Encounter (Signed)
Spoke with pt and she states she recently had bronchitis and still has the nagging cough and drainage.  She wanted to know if she should keep her appt, change it to virtual or reschedule. Pt did have some concerns with her BP.  Changed appt to virtual.  Went over instructions and need for vitals if she is able to get them at home.  Pt verbalized understanding and was appreciative for call.

## 2019-07-02 NOTE — Telephone Encounter (Signed)
New Message  Patient is calling in to see if she needs to reschedule her appointment or if she can do a virtual visit. Patient states that she has bronchitis and is still dealing with a cough, states she has no fever, and has been taking medicine for it, but still has the dry cough. Please give patient a call to discuss.

## 2019-07-03 NOTE — Progress Notes (Signed)
None

## 2019-07-05 ENCOUNTER — Encounter: Payer: Self-pay | Admitting: Interventional Cardiology

## 2019-07-05 ENCOUNTER — Other Ambulatory Visit: Payer: Self-pay

## 2019-07-05 ENCOUNTER — Telehealth (INDEPENDENT_AMBULATORY_CARE_PROVIDER_SITE_OTHER): Payer: Medicare Other | Admitting: Interventional Cardiology

## 2019-07-05 VITALS — BP 117/67 | HR 84 | Ht 64.25 in | Wt 233.0 lb

## 2019-07-05 DIAGNOSIS — I272 Pulmonary hypertension, unspecified: Secondary | ICD-10-CM

## 2019-07-05 DIAGNOSIS — I5022 Chronic systolic (congestive) heart failure: Secondary | ICD-10-CM

## 2019-07-05 DIAGNOSIS — I447 Left bundle-branch block, unspecified: Secondary | ICD-10-CM

## 2019-07-05 DIAGNOSIS — Z7189 Other specified counseling: Secondary | ICD-10-CM

## 2019-07-05 DIAGNOSIS — Z7901 Long term (current) use of anticoagulants: Secondary | ICD-10-CM

## 2019-07-05 DIAGNOSIS — I10 Essential (primary) hypertension: Secondary | ICD-10-CM

## 2019-07-05 MED ORDER — RIVAROXABAN 20 MG PO TABS: 20.0000 mg | ORAL_TABLET | Freq: Every day | ORAL | 1 refills | Status: DC

## 2019-07-05 MED ORDER — LOSARTAN POTASSIUM 25 MG PO TABS: 25.0000 mg | ORAL_TABLET | Freq: Every day | ORAL | 3 refills | Status: DC

## 2019-07-05 MED ORDER — CARVEDILOL 6.25 MG PO TABS: 6.2500 mg | ORAL_TABLET | Freq: Two times a day (BID) | ORAL | 3 refills | Status: DC

## 2019-07-05 MED ORDER — SPIRONOLACTONE 25 MG PO TABS: 25.0000 mg | ORAL_TABLET | Freq: Every day | ORAL | 3 refills | Status: DC

## 2019-07-05 NOTE — Progress Notes (Signed)
Virtual Visit via Video Note   This visit type was conducted due to national recommendations for restrictions regarding the COVID-19 Pandemic (e.g. social distancing) in an effort to limit this patient's exposure and mitigate transmission in our community.  Due to her co-morbid illnesses, this patient is at least at moderate risk for complications without adequate follow up.  This format is felt to be most appropriate for this patient at this time.  All issues noted in this document were discussed and addressed.  A limited physical exam was performed with this format.  Please refer to the patient's chart for her consent to telehealth for [facility].   Date:  07/05/2019   ID:  Patricia Fischer, DOB [birth year], MRN [id number]  Patient Location: Home Provider Location: Office  PCP:  Arnetha Courser, MD  Cardiologist:  No primary care provider on file.  Electrophysiologist:  None   Evaluation Performed:  Follow-Up Visit  Chief Complaint: Chronic systolic heart failure  History of Present Illness:    Patricia Fischer is a 63 y.o. female with   oldpulmonaryembolism, chronic systolic heart failure, essential hypertension, and chronic anticoagulation therapy.EF 52% by MRI (2018, December).  She is not experiencing limitations related to shortness of breath, swelling, chest pain, palpitations, and syncope.  The patient does not have symptoms concerning for COVID-19 infection (fever, chills, cough, or new shortness of breath).    Past Medical History:  Diagnosis Date   Anxiety    Asthma    Blood transfusion 1979   Chidbirth   Breast cancer ([place]) 04/26/2008   left breast   Breast cancer, left breast ([place]) 2011   DCIS   Chronic migraine without aura, with intractable migraine, so stated, with status migrainosus March [id number]   Chronic systolic CHF (congestive heart failure) ([place]) 05/05/2015   a. Dx 04/2015 - EF initially 25-30% with RV dysfunction, f/u echo 08/2015  technically difficult, EF 35-40%, anterior, anteroseptal, apical and inferoapical severe hypokinesis suggestive of LAD territory ischemia/infarct, grade 1 DD, mild MR, RV normal.   Depression    DVT (deep venous thrombosis) ([place]) Aug. 2016   a. Dx 04/2015 - patient returned 05/2015 after noncompliance with Xarelto.   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 Dx 04/2015 - patient returned 05/2015 after noncompliance with Xarelto.   Shortness of breath dyspnea    Syncope    a. Recurrent syncope in 2016 felt 2/2 orthostasis in setting of PE.   Past Surgical History:  Procedure Laterality Date   ABDOMINAL HYSTERECTOMY  1986   ANAL FISSURE REPAIR     ANTERIOR CERVICAL DECOMP/DISCECTOMY FUSION  01/23/2012   Procedure: ANTERIOR CERVICAL DECOMPRESSION/DISCECTOMY FUSION 1 LEVEL;  Surgeon: Elaina Hoops, MD;  Location: [facility];  Service: Neurosurgery;  Laterality: N/A;  Cervical five-six anterior cervical discectomy fusion with plating   APPENDECTOMY     BREAST BIOPSY Left 06/2016   US biopsy   BREAST SURGERY Left 2011   Breast lumpectomy   CARDIAC CATHETERIZATION  2010   [facility]; pt states it was "clear"   CARDIAC CATHETERIZATION N/A 10/28/2015  Procedure: Right/Left Heart Cath and Coronary Angiography;  Surgeon: Belva Crome, MD;  Location: [facility];  Service: Cardiovascular;  Laterality: N/A;   CARPAL TUNNEL RELEASE Bilateral    CESAREAN SECTION  1982, 1979   CHOLECYSTECTOMY     COLONOSCOPY  01/16/2009   Dr Bary Castilla   KNEE CARTILAGE SURGERY Left    SHOULDER ARTHROSCOPY WITH SUBACROMIAL DECOMPRESSION, ROTATOR CUFF REPAIR AND  BICEP TENDON REPAIR Right 11/22/2013   Procedure: RIGHT SHOULDER ATHROSCOPY OPEN SUBSCAP REPAIR DELTA-PECTORAL ;  Surgeon: Augustin Schooling, MD;  Location: [facility];  Service: Orthopedics;  Laterality: Right;   TARSAL TUNNEL RELEASE Left 1990   TONSILLECTOMY     TUBAL LIGATION       Current Meds  Medication Sig   albuterol (VENTOLIN HFA) 108 (90 Base) MCG/ACT inhaler INHALE 2 PUFFS INTO THE LUNGS EVERY 4 (FOUR) HOURS AS NEEDED FOR WHEEZING OR SHORTNESS OF BREATH.   carvedilol (COREG) 6.25 MG tablet TAKE 1 TABLET BY MOUTH TWICE A DAY   cetirizine (ZYRTEC) 10 MG tablet Take 10 mg by mouth daily.   fluticasone (FLONASE) 50 MCG/ACT nasal spray INSTILL 1 SPRAY INTO EACH NOSTRIL EVERY DAY AS NEEDED   losartan (COZAAR) 25 MG tablet Take 1 tablet (25 mg total) by mouth daily. Please keep upcoming appt in October. Thank you   spironolactone (ALDACTONE) 25 MG tablet Take 1 tablet (25 mg total) by mouth daily. TAKE 1 TABLET BY MOUTH DAILY ON MONDAY,WEDNESDAY AND FRIDAYS. Please keep upcoming appt in October. Thank you   tiZANidine (ZANAFLEX) 2 MG tablet One by mouth every eight hours if needed   vitamin B-12 (CYANOCOBALAMIN) 500 MCG tablet Take 2,500 mcg by mouth daily.   vitamin C (ASCORBIC ACID) 500 MG tablet Take 500 mg by mouth daily.   XARELTO 20 MG TABS tablet TAKE 1 TABLET BY MOUTH EVERY DAY     Allergies:   Gadolinium derivatives, Levaquin [levofloxacin in d5w], Sulfa antibiotics, Vancomycin, Aripiprazole, Codeine, Oxycodone, and Penicillins   Social History   Tobacco Use   Smoking status: Never Smoker   Smokeless tobacco: Never Used  Substance Use Topics   Alcohol use: Yes    Comment: occasional wine   Drug use: No     Family Hx: The patient's family history includes Aneurysm in her paternal grandfather; Breast cancer in her cousin, cousin, cousin, and paternal aunt; Breast cancer (age of onset: 41) in her mother; COPD in her father and paternal grandfather; Cancer in her  father and mother; Heart attack in her paternal grandmother; Heart disease in her paternal grandmother; Hypertension in her mother; Migraines in her mother; Rectal cancer in her father; Stroke in her paternal grandmother. There is no history of Anesthesia problems or Diabetes.  ROS:   Please see the history of present illness.    [place] and [place].  She has no complaints.  Been under stress related to the COVID-19 pandemic. All other systems reviewed and are negative.   Prior CV studies:   The following studies were reviewed today:  Magnetic resonance imaging 08/21/2017:  IMPRESSION: 1.  Normal LV size with mild diffuse hypokinesis, EF 52%.  2.  Normal RV size and systolic function.  3. No myocardial LGE, so no definitive evidence for prior MI, infiltrative disease, or myocarditis.    Labs/Other Tests and Data Reviewed:    EKG:  No ECG reviewed.  Recent Labs: 08/08/2018: ALT 13; BUN 13; Creat 0.81; Hemoglobin 12.9; Platelets 190; Potassium 4.1; Sodium  142; TSH 2.22   Recent Lipid Panel Lab Results  Component Value Date/Time   CHOL 169 08/08/2018 09:54 AM   TRIG 120 08/08/2018 09:54 AM   HDL 43 (L) 08/08/2018 09:54 AM   CHOLHDL 3.9 08/08/2018 09:54 AM   LDLCALC 104 (H) 08/08/2018 09:54 AM    Wt Readings from Last 3 Encounters:  07/05/19 233 lb (105.7 kg)  08/08/18 242 lb 12.8 oz (110.1 kg)  05/30/18 242 lb 6.4 oz (110 kg)     Objective:    Vital Signs:  BP 117/67    Pulse 84    Ht 5' 4.25" (1.632 m)    Wt 233 lb (105.7 kg)    BMI 39.68 kg/m   Morbid obesity  VITAL SIGNS:  reviewed GEN:  no acute distress RESPIRATORY:  normal respiratory effort, symmetric expansion CARDIOVASCULAR:  no peripheral edema PSYCH:  normal affect  ASSESSMENT & PLAN:    1. Chronic systolic CHF (congestive heart failure) ([place])   2. LBBB (left bundle branch block)-rate related    3. Essential hypertension   4. Chronic anticoagulation   5.  Pulmonary hypertension ([place])   6. Morbid obesity ([place])   7. Educated about COVID-19 virus infection    PLAN:  1. Systolic heart failure has resolved to improved EF, documented 55% by MRI in December 2018.  Patient has minimal symptoms.  She is to continue on guideline directed therapy to preserve LV function. 2. EKG was not performed today.  Required resynchronization therapy. 3. Blood pressure is excellent and less than 130/80 mmHg. 4. Continue Xarelto. 5. History of recurrent pulmonary emboli and secondary pulmonary hypertension. 6. Continues to be an issue.  Increase physical activity as recommended. 7. Social distancing, mask wearing, and handwashing is recommended.  Guideline directed therapy for left ventricular systolic dysfunction: Angiotensin receptor-neprilysin inhibitor (ARNI)-Entresto; beta-blocker therapy - carvedilol or metoprolol succinate; mineralocorticoid receptor antagonist (MRA) therapy -spironolactone or eplerenone.  These therapies have been shown to improve clinical outcomes including reduction of rehospitalization survival, and acute heart failure.   COVID-19 Education: The signs and symptoms of COVID-19 were discussed with the patient and how to seek care for testing (follow up with PCP or arrange E-visit).  The importance of social distancing was discussed today.  Time:   Today, I have spent 15 minutes with the patient with telehealth technology discussing the above problems.     Medication Adjustments/Labs and Tests Ordered: Current medicines are reviewed at length with the patient today.  Concerns regarding medicines are outlined above.   Tests Ordered: No orders of the defined types were placed in this encounter.   Medication Changes: No orders of the defined types were placed in this encounter.   Follow Up:  In Person in 7 month(s)  Signed, Sinclair Grooms, MD  07/05/2019 5:03 PM    [facility]

## 2019-07-07 ENCOUNTER — Other Ambulatory Visit: Payer: Self-pay | Admitting: Interventional Cardiology

## 2019-07-13 ENCOUNTER — Other Ambulatory Visit: Payer: Self-pay | Admitting: Family Medicine

## 2019-07-15 NOTE — Telephone Encounter (Signed)
Requested medication (s) are due for refill today: yes  Requested medication (s) are on the active medication list: yes  Last refill:  04/10/2019  Future visit scheduled: no  Notes to clinic:  Has not seen another provider Refill cannot be delegated    Requested Prescriptions  Pending Prescriptions Disp Refills   tiZANidine (ZANAFLEX) 2 MG tablet [Pharmacy Med Name: TIZANIDINE HCL 2 MG TABLET] 270 tablet 1    Sig: TAKE 1 TABLET BY MOUTH EVERY 8 HOURS AS NEEDED     Not Delegated - Cardiovascular:  Alpha-2 Agonists - tizanidine Failed - 07/13/2019 10:25 AM      Failed - This refill cannot be delegated      Failed - Valid encounter within last 6 months    Recent Outpatient Visits          11 months ago Preventative health care   [facility] [place], Satira Anis, MD   1 year ago Pneumonia of right middle lobe due to infectious organism [facility])   [place], Satira Anis, MD   1 year ago Tachycardia   [place], FNP   1 year ago Urinary frequency   [facility] L[facility], Satira Anis, MD   2 years ago Preventative health care   [facility] Lada, Satira Anis, MD

## 2019-07-15 NOTE — Telephone Encounter (Signed)
Please schedule patient for follow up in the next 30 days. Also due for CPE in December - may do combined 71min appt if she would like.

## 2019-07-16 NOTE — Telephone Encounter (Signed)
lvm to schdule cpe and 30 day or together in dec

## 2019-08-23 ENCOUNTER — Other Ambulatory Visit: Payer: Self-pay | Admitting: Family Medicine

## 2019-08-23 NOTE — Telephone Encounter (Signed)
LVM needs appt for any more refills

## 2019-08-23 NOTE — Telephone Encounter (Signed)
Pt has not been seen in over a year - needs appt for any additional refills.

## 2019-08-23 NOTE — Telephone Encounter (Signed)
Requested medication (s) are due for refill today: no  Requested medication (s) are on the active medication list: yes  Last refill:  07/13/2019  Future visit scheduled: yes  Notes to clinic: patient has a upcoming appointment  One inhaler should last one month Review for refill Requested Prescriptions  Pending Prescriptions Disp Refills   PROAIR HFA 108 (90 Base) MCG/ACT inhaler [Pharmacy Med Name: PROAIR HFA 90 MCG INHALER]  1    Sig: INHALE 2 PUFFS INTO THE LUNGS EVERY 4 (FOUR) HOURS AS NEEDED FOR WHEEZING OR SHORTNESS OF BREATH.      Pulmonology:  Beta Agonists Failed - 08/23/2019  1:38 AM      Failed - One inhaler should last at least one month. If the patient is requesting refills earlier, contact the patient to check for uncontrolled symptoms.      Failed - Valid encounter within last 12 months    Recent Outpatient Visits           1 year ago Preventative health care   [place], MD   1 year ago Pneumonia of right middle lobe due to infectious organism [facility])   [facility] [place], Satira Anis, MD   1 year ago Tachycardia   [facility] [place], FNP   2 years ago Urinary frequency   [place], Satira Anis, MD   2 years ago Preventative health care   [place], MD       Future Appointments             In 1 week Hubbard Hartshorn, [facility], [facility]

## 2019-08-28 ENCOUNTER — Other Ambulatory Visit: Payer: Medicare Other

## 2019-09-02 ENCOUNTER — Ambulatory Visit: Payer: Medicare Other | Admitting: Family Medicine

## 2019-09-18 ENCOUNTER — Other Ambulatory Visit: Payer: Self-pay

## 2019-09-18 ENCOUNTER — Encounter: Payer: Self-pay | Admitting: Family Medicine

## 2019-09-18 ENCOUNTER — Ambulatory Visit (INDEPENDENT_AMBULATORY_CARE_PROVIDER_SITE_OTHER): Payer: Medicare Other | Admitting: Family Medicine

## 2019-09-18 VITALS — BP 122/67 | HR 83 | Ht 64.5 in | Wt 232.0 lb

## 2019-09-18 DIAGNOSIS — I5022 Chronic systolic (congestive) heart failure: Secondary | ICD-10-CM

## 2019-09-18 DIAGNOSIS — J441 Chronic obstructive pulmonary disease with (acute) exacerbation: Secondary | ICD-10-CM | POA: Diagnosis not present

## 2019-09-18 DIAGNOSIS — D649 Anemia, unspecified: Secondary | ICD-10-CM

## 2019-09-18 DIAGNOSIS — E785 Hyperlipidemia, unspecified: Secondary | ICD-10-CM

## 2019-09-18 DIAGNOSIS — I110 Hypertensive heart disease with heart failure: Secondary | ICD-10-CM

## 2019-09-18 DIAGNOSIS — Z5181 Encounter for therapeutic drug level monitoring: Secondary | ICD-10-CM

## 2019-09-18 DIAGNOSIS — M62838 Other muscle spasm: Secondary | ICD-10-CM

## 2019-09-18 DIAGNOSIS — J309 Allergic rhinitis, unspecified: Secondary | ICD-10-CM

## 2019-09-18 DIAGNOSIS — F331 Major depressive disorder, recurrent, moderate: Secondary | ICD-10-CM

## 2019-09-18 DIAGNOSIS — I272 Pulmonary hypertension, unspecified: Secondary | ICD-10-CM

## 2019-09-18 DIAGNOSIS — I2699 Other pulmonary embolism without acute cor pulmonale: Secondary | ICD-10-CM

## 2019-09-18 DIAGNOSIS — G2581 Restless legs syndrome: Secondary | ICD-10-CM

## 2019-09-18 DIAGNOSIS — I10 Essential (primary) hypertension: Secondary | ICD-10-CM

## 2019-09-18 MED ORDER — ALBUTEROL SULFATE HFA 108 (90 BASE) MCG/ACT IN AERS: 2.0000 | INHALATION_SPRAY | Freq: Four times a day (QID) | RESPIRATORY_TRACT | 2 refills | Status: DC | PRN

## 2019-09-18 MED ORDER — BUDESONIDE-FORMOTEROL FUMARATE 160-4.5 MCG/ACT IN AERO: 2.0000 | INHALATION_SPRAY | Freq: Two times a day (BID) | RESPIRATORY_TRACT | 3 refills | Status: DC

## 2019-09-18 MED ORDER — BENZONATATE 100 MG PO CAPS: 100.0000 mg | ORAL_CAPSULE | Freq: Three times a day (TID) | ORAL | 0 refills | Status: DC | PRN

## 2019-09-18 MED ORDER — TIZANIDINE HCL 2 MG PO TABS: ORAL_TABLET | ORAL | 1 refills | Status: DC

## 2019-09-18 MED ORDER — AZITHROMYCIN 250 MG PO TABS: 250.0000 mg | ORAL_TABLET | Freq: Every day | ORAL | 0 refills | Status: DC

## 2019-09-18 MED ORDER — PREDNISONE 20 MG PO TABS: ORAL_TABLET | ORAL | 0 refills | Status: DC

## 2019-09-18 NOTE — Progress Notes (Signed)
Name: Patricia Fischer   MRN: [id number]    DOB: 03/18/1956   Date:09/18/2019       Progress Note  Subjective:    Chief Complaint  Chief Complaint  Patient presents with  . Medication Refill    Needs refill of tizanidine and albuterol inhaler  . Cough    I connected with  Patricia Fischer on 09/18/19 at  1:40 PM EST by telephone and verified that I am speaking with the correct person using two identifiers.  I discussed the limitations, risks, security and privacy concerns of performing an evaluation and management service by telephone and the availability of in person appointments. Staff also discussed with the patient that there may be a patient responsible charge related to this service. Patient Location: home Provider Location: [facility] clinic  Additional Individuals present: none  HPI  Pt has not been seen here in this clinic for over a year, last was a complete physical done December 2019 by her prior PCP, Dr. Sanda Fischer who has since retired. New to me  Only visit recently that I can see per chart review is to cardiology last month, with Dr. Tamala Fischer, for oldpulmonaryembolism, chronic systolic heart failure, essential hypertension, and chronic anticoagulation therapy.EF 52% by MRI (2018, December). Telemedicine visit reviewed today -systolic heart failure has improved to EF of 55% per MRI in December 2018, continued management of preserving left ventricular function, blood pressure was well controlled, patient was to continue Xarelto for recurrent pulmonary emboli and secondary pulmonary hypertension Dr. Tamala Fischer did refill Coreg 6.25 mg twice daily, losartan 25 mg daily, Xarelto 20 mg daily, spironolactone 25 mg 3 times a week on Monday Wednesday and Friday  No labs done since 08/2018 Will order labs today   No change to her sx since last month, continues to deny any exertional chest pain shortness of breath, lower extremity edema, near syncope, chest pain, palpitations,  orthopnea.  Blood pressure continues to be well controlled BP Readings from Last 3 Encounters:  09/18/19 122/67  07/05/19 117/67  08/08/18 126/74   She request a refill of muscle relaxer tizanidine which she uses at night for restless leg type symptoms which began after having DVT and swelling to her lower extremities.  She does take 1 dose most nights and occasionally will take 2 pills.  She also has seasonal allergies/allergic rhinitis managed with Zyrtec and Flonase  She complains of recurrence of bronchitis/cough, onset at least 2 to 3 months ago.  She endorses hx of bronchitis "every year in October and November" usually has bronchitis from October to February, uses inhalers and cough medicine.  She also request a refill of her albuterol inhaler she states she is using a few times a day she was previously on a daily maintenance inhaler but does not have that now and has not for several years.  She is coughing off and on since the end of november, some days bad, some days doesn't cough at all, cough generally is non-productive, she states she has no known exposure to Covid does not go anywhere has not been around anyone sick and this is typical for her seasonal bronchitis.  She also states that she "doesn't feel bad" including no fever chills sweats body aches headaches chest pain.  She does have wheeze.  States that usually she just uses Tussionex cough medicine and rescue inhaler for several months and then she gets better again.  She states she is been in [place] I would like her medicine sent  there in [place], she is coming back home to [place] in the next week or 2 and is going to see cardiology and have labs done there.  She would like to have her physical done but was told she cannot because of her cough. She is not done any Covid testing but again she is had this cough for several months it is typical of her COPD during this time a year, and she denies any exposure to Covid is  staying in her home and not visiting or going out shopping etc.   Patient Active Problem List   Diagnosis Date Noted  . Preventative health care 08/08/2018  . Morbid obesity ([place]) 07/12/2017  . Allergic rhinitis 06/26/2017  . Hematuria 07/27/2016  . Medication monitoring encounter 06/08/2016  . Degenerative joint disease (DJD) of hip 03/31/2016  . Hx of breast cancer 03/22/2016  . GERD (gastroesophageal reflux disease) 10/07/2015  . HTN (hypertension) 10/07/2015  . Depression, major, recurrent, moderate ([place]) 06/11/2015  . Anemia 05/15/2015  . Do not resuscitate status 05/15/2015  . Chronic systolic CHF (congestive heart failure) ([place]) 05/05/2015  . Left leg DVT ([place]) 05/05/2015  . Pulmonary hypertension ([place])   . Saddle pulmonary embolus ([place])   . LBBB (left bundle branch block)-rate related  04/19/2015  . Chronic migraine without aura, with intractable migraine, so stated, with status migrainosus 11/04/2014  . Subscapularis (muscle) sprain 11/22/2013    Current Outpatient Medications:  .  carvedilol (COREG) 6.25 MG tablet, Take 1 tablet (6.25 mg total) by mouth 2 (two) times daily., Disp: 180 tablet, Rfl: 3 .  cetirizine (ZYRTEC) 10 MG tablet, Take 10 mg by mouth daily., Disp: , Rfl:  .  fluticasone (FLONASE) 50 MCG/ACT nasal spray, INSTILL 1 SPRAY INTO EACH NOSTRIL EVERY DAY AS NEEDED, Disp: 48 mL, Rfl: 3 .  losartan (COZAAR) 25 MG tablet, Take 1 tablet (25 mg total) by mouth daily., Disp: 90 tablet, Rfl: 3 .  PROAIR HFA 108 (90 Base) MCG/ACT inhaler, INHALE 2 PUFFS INTO THE LUNGS EVERY 4 (FOUR) HOURS AS NEEDED FOR WHEEZING OR SHORTNESS OF BREATH., Disp: 1 g, Rfl: 0 .  rivaroxaban (XARELTO) 20 MG TABS tablet, Take 1 tablet (20 mg total) by mouth daily., Disp: 90 tablet, Rfl: 1 .  spironolactone (ALDACTONE) 25 MG tablet, Take 1 tablet (25 mg total) by mouth daily. TAKE 1 TABLET BY MOUTH DAILY ON MONDAY,WEDNESDAY AND FRIDAYS., Disp: 45 tablet, Rfl: 3 .  tiZANidine (ZANAFLEX) 2 MG  tablet, TAKE 1 TABLET BY MOUTH EVERY 8 HOURS AS NEEDED, Disp: 30 tablet, Rfl: 0 .  vitamin B-12 (CYANOCOBALAMIN) 500 MCG tablet, Take 2,500 mcg by mouth daily., Disp: , Rfl:  .  vitamin C (ASCORBIC ACID) 500 MG tablet, Take 500 mg by mouth daily., Disp: , Rfl:  Allergies  Allergen Reactions  . Gadolinium Derivatives Shortness Of Breath and Nausea And Vomiting  . Levaquin [Levofloxacin In D5w] Itching    Was giving with vancomycin at same time - unsure which one pt had a reaction to  . Sulfa Antibiotics Swelling    "Ears swelled up like Dumbo"  . Vancomycin Itching    Was giving at same time as Levaquin - unsure which pt had reaction to  . Aripiprazole     Dry mouth with sores   . Codeine Itching and Rash  . Oxycodone Itching  . Penicillins Hives    Has patient had a PCN reaction causing immediate rash, facial/tongue/throat swelling, SOB or lightheadedness with hypotension: Yes Has  patient had a PCN reaction causing severe rash involving mucus membranes or skin necrosis: No Has patient had a PCN reaction that required hospitalization No Has patient had a PCN reaction occurring within the last 10 years: No If all of the above answers are "NO", then may proceed with Cephalosporin use.    Past Surgical History:  Procedure Laterality Date  . ABDOMINAL HYSTERECTOMY  1986  . ANAL FISSURE REPAIR    . ANTERIOR CERVICAL DECOMP/DISCECTOMY FUSION  01/23/2012   Procedure: ANTERIOR CERVICAL DECOMPRESSION/DISCECTOMY FUSION 1 LEVEL;  Surgeon: Elaina Hoops, MD;  Location: [place] [facility];  Service: Neurosurgery;  Laterality: N/A;  Cervical five-six anterior cervical discectomy fusion with plating  . APPENDECTOMY    . BREAST BIOPSY Left 06/2016   US biopsy  . BREAST SURGERY Left 2011   Breast lumpectomy  . CARDIAC CATHETERIZATION  2010   [facility]; pt states it was "clear"  . CARDIAC CATHETERIZATION N/A 10/28/2015   Procedure: Right/Left Heart Cath and Coronary Angiography;  Surgeon: Belva Crome, MD;  Location: [place] [facility];  Service: Cardiovascular;  Laterality: N/A;  . CARPAL TUNNEL RELEASE Bilateral   . CESAREAN SECTION  1982, 1979  . CHOLECYSTECTOMY    . COLONOSCOPY  01/16/2009   Dr Bary Castilla  . KNEE CARTILAGE SURGERY Left   . SHOULDER ARTHROSCOPY WITH SUBACROMIAL DECOMPRESSION, ROTATOR CUFF REPAIR AND BICEP TENDON REPAIR Right 11/22/2013   Procedure: RIGHT SHOULDER ATHROSCOPY OPEN SUBSCAP REPAIR DELTA-PECTORAL ;  Surgeon: Augustin Schooling, MD;  Location: [place];  Service: Orthopedics;  Laterality: Right;  . TARSAL TUNNEL RELEASE Left 1990  . TONSILLECTOMY    . TUBAL LIGATION     Family History  Problem Relation Age of Onset  . Breast cancer Paternal Aunt   . Breast cancer Mother 74  . Migraines Mother   . Cancer Mother   . Hypertension Mother   . Rectal cancer Father   . Cancer Father   . COPD Father   . Heart disease Paternal Grandmother   . Stroke Paternal Grandmother   . Heart attack Paternal Grandmother   . Aneurysm Paternal Grandfather   . COPD Paternal Grandfather   . Breast cancer Cousin   . Breast cancer Cousin   . Breast cancer Cousin   . Anesthesia problems Neg Hx   . Diabetes Neg Hx    Social History   Socioeconomic History  . Marital status: Divorced    Spouse name: Not on file  . Number of children: 2  . Years of education: 38  . Highest education level: Some college, no degree  Occupational History  . Occupation: retired  Tobacco Use  . Smoking status: Never Smoker  . Smokeless tobacco: Never Used  Substance and Sexual Activity  . Alcohol use: Yes    Comment: occasional wine  . Drug use: No  . Sexual activity: Yes  Other Topics Concern  . Not on file  Social History Narrative   Independent at baseline.   Social Determinants of Health   Financial Resource Strain:   . Difficulty of Paying Living Expenses: Not on file  Food Insecurity:   . Worried About Charity fundraiser in the Last Year: Not on file  . Ran Out of Food in  the Last Year: Not on file  Transportation Needs:   . Lack of Transportation (Medical): Not on file  . Lack of Transportation (Non-Medical): Not on file  Physical Activity:   . Days of Exercise per  Week: Not on file  . Minutes of Exercise per Session: Not on file  Stress:   . Feeling of Stress : Not on file  Social Connections:   . Frequency of Communication with Friends and Family: Not on file  . Frequency of Social Gatherings with Friends and Family: Not on file  . Attends Religious Services: Not on file  . Active Member of Clubs or Organizations: Not on file  . Attends Archivist Meetings: Not on file  . Marital Status: Not on file  Intimate Partner Violence:   . Fear of Current or Ex-Partner: Not on file  . Emotionally Abused: Not on file  . Physically Abused: Not on file  . Sexually Abused: Not on file    Chart Review Today: I personally reviewed active problem list, medication list, allergies, family history, social history, health maintenance, notes from last encounter, lab results, imaging with the patient/caregiver today. Reviewed last physical, last cardiology visit, cardiology testing  Review of Systems  Constitutional: Negative.   HENT: Negative.   Eyes: Negative.   Respiratory: Negative.   Cardiovascular: Negative.   Gastrointestinal: Negative.   Endocrine: Negative.   Genitourinary: Negative.   Musculoskeletal: Negative.   Skin: Negative.   Allergic/Immunologic: Negative.   Neurological: Negative.   Hematological: Negative.   Psychiatric/Behavioral: Negative.   All other systems reviewed and are negative.    Objective:    Virtual encounter, vitals limited, only able to obtain the following: Today's Vitals   09/18/19 1308  BP: 122/67  Pulse: 83  SpO2: 93%  Weight: 232 lb (105.2 kg)  Height: 5' 4.5" (1.638 m)   Body mass index is 39.21 kg/m. Nursing Note and Vital Signs reviewed.  Physical Exam Vitals and nursing note reviewed.   Pulmonary:     Comments: Able to speak in complete sentences no audible wheeze or stridor, intermittent coughing Neurological:     Mental Status: She is alert.  Psychiatric:        Mood and Affect: Mood normal.        Behavior: Behavior normal.     PE limited by telephone encounter   PHQ2/9: Depression screen [facility] 2/9 09/18/2019 08/08/2018 07/11/2017 06/26/2017 08/04/2016  Decreased Interest - 0 0 0 0  Down, Depressed, Hopeless 0 0 0 0 0  PHQ - 2 Score 0 0 0 0 0  Altered sleeping 0 0 - - -  Tired, decreased energy 0 0 - - -  Change in appetite 0 0 - - -  Feeling bad or failure about yourself  0 0 - - -  Trouble concentrating 0 0 - - -  Moving slowly or fidgety/restless 0 0 - - -  Suicidal thoughts 0 0 - - -  PHQ-9 Score 0 0 - - -  Difficult doing work/chores Not difficult at all Not difficult at all - - -   PHQ-2/9 Result is negative, reviewed today  Fall Risk: Fall Risk  09/18/2019 08/08/2018 07/11/2017 06/26/2017 08/04/2016  Falls in the past year? 0 0 Yes Yes Yes  Comment - - - - -  Number falls in past yr: 0 0 1 1 2  or more  Injury with Fall? 0 - No No No  Risk Factor Category  - - - - -  Risk for fall due to : - - - - -  Risk for fall due to: Comment - - - - -  Follow up - - - - -     Assessment and Plan:  1. COPD with acute exacerbation [facility]) Patient symptoms appear consistent with a COPD exacerbation or at least more severe COPD during winter months, encouraged her to use a maintenance inhaler during these months, will treat her now with a steroid burst, Z-Pak, start Symbicort, continue to use over-the-counter cough medicines as needed also encouraged her to use Mucinex and try Tessalon, follow-up if any worsening.  Do feel that since it is consistent with her history if she has improving symptoms she would be safe to come into clinic for her physical in about 2 weeks - albuterol (VENTOLIN HFA) 108 (90 Base) MCG/ACT inhaler; Inhale 2 puffs into the lungs every 6  (six) hours as needed for wheezing or shortness of breath.  Dispense: 18 g; Refill: 2 - azithromycin (ZITHROMAX Z-PAK) 250 MG tablet; Take 1 tablet (250 mg total) by mouth daily. 500mg  PO day 1, then 250mg  PO days 2-5  Dispense: 6 tablet; Refill: 0 - benzonatate (TESSALON) 100 MG capsule; Take 1 capsule (100 mg total) by mouth 3 (three) times daily as needed for cough.  Dispense: 30 capsule; Refill: 0 - predniSONE (DELTASONE) 20 MG tablet; 3 tabs poqday 1-3, 2 tabs poqday 4-6, 1 tab poqday 7-9  Dispense: 18 tablet; Refill: 0 - budesonide-formoterol (SYMBICORT) 160-4.5 MCG/ACT inhaler; Inhale 2 puffs into the lungs 2 (two) times daily. Rinse out mouth and spit after each use  Dispense: 1 Inhaler; Refill: 3  2. Hyperlipidemia, unspecified hyperlipidemia type Labs ordered but patient prefers to do them with cardiology, will review when they are resulted  3. Chronic systolic CHF (congestive heart failure) [facility]) Per cardiology  4. Morbid obesity ([place]) BMI 39 Will review lifestyle and weight at her CPE  5. Anemia, unspecified type CBC was ordered by cardiology will review  6. Depression, major, recurrent, moderate (HCC) Mood good, currently not on any medications.  7. Essential hypertension Stable, well controlled, per cardiology, labs pending  8. Recurrent pulmonary emboli (H[facility]) Compliant with Xarelto no change to cardiac symptoms, pulmonary symptoms consistent with COPD  9. Pulmonary hypertension ([place]) Did explain to the patient that I would like to manage her COPD more aggressively to decrease any stress with her underlying pulmonary hypertension and CHF patient agrees, see above  10. Allergic rhinitis, unspecified seasonality, unspecified trigger Continue daily antihistamine and Flonase  11. Medication monitoring encounter I did order labs but patient prefers to do them with cardiology, same labs ordered including CBC, CMP, lipid  12. RLS (restless legs syndrome) Will refill her  past medication which she uses for leg symptoms at night, would prefer something like Lyrica but this has worked for the patient and it is low-dose - tiZANidine (ZANAFLEX) 2 MG tablet; TAKE 1 TABLET BY MOUTH EVERY 8 HOURS AS NEEDED  Dispense: 90 tablet; Refill: 1  13. Muscle spasms of lower extremity, unspecified laterality - tiZANidine (ZANAFLEX) 2 MG tablet; TAKE 1 TABLET BY MOUTH EVERY 8 HOURS AS NEEDED  Dispense: 90 tablet; Refill: 1   I discussed the assessment and treatment plan with the patient. The patient was provided an opportunity to ask questions and all were answered. The patient agreed with the plan and demonstrated an understanding of the instructions.   The patient was advised to call back or seek an in-person evaluation if the symptoms worsen or if the condition fails to improve as anticipated.  I provided 20 minutes of non-face-to-face time during this encounter.  Delsa Grana, PA-C 09/18/19 1:57 PM

## 2019-09-30 ENCOUNTER — Other Ambulatory Visit: Payer: Self-pay

## 2019-09-30 ENCOUNTER — Other Ambulatory Visit: Payer: Medicare Other | Admitting: *Deleted

## 2019-09-30 DIAGNOSIS — I5022 Chronic systolic (congestive) heart failure: Secondary | ICD-10-CM

## 2019-09-30 DIAGNOSIS — Z7901 Long term (current) use of anticoagulants: Secondary | ICD-10-CM

## 2019-09-30 DIAGNOSIS — I10 Essential (primary) hypertension: Secondary | ICD-10-CM

## 2019-10-02 LAB — BASIC METABOLIC PANEL
BUN/Creatinine Ratio: 21 (ref 12–28)
BUN: 18 mg/dL (ref 8–27)
CO2: 23 mmol/L (ref 20–29)
Calcium: 9.5 mg/dL (ref 8.7–10.3)
Chloride: 110 mmol/L — ABNORMAL HIGH (ref 96–106)
Creatinine, Ser: 0.85 mg/dL (ref 0.57–1.00)
GFR calc Af Amer: 84 mL/min/{1.73_m2} (ref >59)
GFR calc non Af Amer: 73 mL/min/{1.73_m2} (ref >59)
Glucose: 122 mg/dL — ABNORMAL HIGH (ref 65–99)
Potassium: 4.5 mmol/L (ref 3.5–5.2)
Sodium: 155 mmol/L — ABNORMAL HIGH (ref 134–144)

## 2019-10-02 LAB — HEPATIC FUNCTION PANEL
ALT: 17 IU/L (ref 0–32)
AST: 17 IU/L (ref 0–40)
Albumin: 4.3 g/dL (ref 3.8–4.8)
Alkaline Phosphatase: 102 IU/L (ref 39–117)
Bilirubin Total: <0.2 mg/dL (ref 0.0–1.2)
Bilirubin, Direct: 0.05 mg/dL (ref 0.00–0.40)
Total Protein: 7.1 g/dL (ref 6.0–8.5)

## 2019-10-02 LAB — LIPID PANEL
Chol/HDL Ratio: 3.6 ratio (ref 0.0–4.4)
Cholesterol, Total: 193 mg/dL (ref 100–199)
HDL: 53 mg/dL (ref >39)
LDL Chol Calc (NIH): 113 mg/dL — ABNORMAL HIGH (ref 0–99)
Triglycerides: 152 mg/dL — ABNORMAL HIGH (ref 0–149)
VLDL Cholesterol Cal: 27 mg/dL (ref 5–40)

## 2019-10-02 LAB — CBC
Hematocrit: 39.3 % (ref 34.0–46.6)
Hemoglobin: 13.1 g/dL (ref 11.1–15.9)
MCH: 28.9 pg (ref 26.6–33.0)
MCHC: 33.3 g/dL (ref 31.5–35.7)
MCV: 87 fL (ref 79–97)
Platelets: 185 10*3/uL (ref 150–450)
RBC: 4.54 x10E6/uL (ref 3.77–5.28)
RDW: 13.4 % (ref 11.7–15.4)
WBC: 10.9 10*3/uL — ABNORMAL HIGH (ref 3.4–10.8)

## 2019-10-04 ENCOUNTER — Ambulatory Visit (INDEPENDENT_AMBULATORY_CARE_PROVIDER_SITE_OTHER): Payer: Medicare Other | Admitting: Family Medicine

## 2019-10-04 ENCOUNTER — Other Ambulatory Visit: Payer: Self-pay

## 2019-10-04 ENCOUNTER — Telehealth: Payer: Self-pay | Admitting: Interventional Cardiology

## 2019-10-04 ENCOUNTER — Encounter: Payer: Self-pay | Admitting: Family Medicine

## 2019-10-04 VITALS — BP 134/72 | HR 99 | Temp 97.3°F | Resp 20 | Ht 64.5 in | Wt 254.6 lb

## 2019-10-04 DIAGNOSIS — J302 Other seasonal allergic rhinitis: Secondary | ICD-10-CM | POA: Diagnosis not present

## 2019-10-04 DIAGNOSIS — E870 Hyperosmolality and hypernatremia: Secondary | ICD-10-CM | POA: Diagnosis not present

## 2019-10-04 DIAGNOSIS — J449 Chronic obstructive pulmonary disease, unspecified: Secondary | ICD-10-CM | POA: Diagnosis not present

## 2019-10-04 DIAGNOSIS — Z1231 Encounter for screening mammogram for malignant neoplasm of breast: Secondary | ICD-10-CM | POA: Diagnosis not present

## 2019-10-04 MED ORDER — FLUTICASONE PROPIONATE 50 MCG/ACT NA SUSP: NASAL | 3 refills | Status: DC

## 2019-10-04 NOTE — Patient Instructions (Signed)
Push clear fluids, and limit salt.  I will call you with your results and next steps if anything remains abnormal  Your lungs sound great - continue the same inhalers and we can follow up on them at your next visit.    Sign up for mychart so its easier to communicate with [place]

## 2019-10-04 NOTE — Progress Notes (Deleted)
Patient: Patricia Fischer, Female    DOB: [birth year], 64 y.o.   MRN: [id number] Delsa Grana, PA-C Visit Date: 10/04/2019  Today's Provider: Delsa Grana, PA-C   Chief Complaint  Patient presents with  . Annual Exam    with pap   Subjective:   Annual physical exam:  Patricia Fischer is a 64 y.o. female who presents today for complete physical exam:  Exercise/Activity:  Diet/nutrition:  Sleep: {DESC; WELL/FAIRLY WELL/POORLY:18703}.  Pt wished to discuss acute complaints *** do routine f/up on chronic conditions today in addition to CPE. Advised pt of separate visit billing/coding  USPSTF grade A and B recommendations - reviewed and addressed today  Depression:  Phq 9 completed today by patient, was reviewed by me with patient in the room, score is  {Desc; negative/positive:13464::"negative"}, pt feels *** PHQ 2/9 Scores 10/04/2019 09/18/2019 08/08/2018 07/11/2017  PHQ - 2 Score 0 0 0 0  PHQ- 9 Score 1 0 0 -   Depression screen [facility] 2/9 10/04/2019 09/18/2019 08/08/2018 07/11/2017 06/26/2017  Decreased Interest 0 - 0 0 0  Down, Depressed, Hopeless 0 0 0 0 0  PHQ - 2 Score 0 0 0 0 0  Altered sleeping 0 0 0 - -  Tired, decreased energy 1 0 0 - -  Change in appetite 0 0 0 - -  Feeling bad or failure about yourself  0 0 0 - -  Trouble concentrating 0 0 0 - -  Moving slowly or fidgety/restless 0 0 0 - -  Suicidal thoughts 0 0 0 - -  PHQ-9 Score 1 0 0 - -  Difficult doing work/chores Not difficult at all Not difficult at all Not difficult at all - -    Alcohol screening:   Office Visit from 10/04/2019 in [facility]  AUDIT-C Score  1      Immunizations and Health Maintenance: Health Maintenance  Topic Date Due  . COLONOSCOPY  01/16/2014  . MAMMOGRAM  07/18/2018  . PAP SMEAR-Modifier  05/18/2019  . TETANUS/TDAP  05/17/2026  . INFLUENZA VACCINE  Completed  . Hepatitis C Screening  Completed  . HIV Screening  Completed     Hep C Screening:  ***  STD testing and prevention (HIV/chl/gon/syphilis): ***  Intimate partner violence:***  Sexual History/Pain during Intercourse: Divorced  Menstrual History/LMP/Abnormal Bleeding: *** No LMP recorded. Patient has had a hysterectomy.  Incontinence Symptoms: ***  Breast cancer:  Last Mammogram: *** BRCA gene screening: ***  Cervical cancer screening: *** Family hx of cancers - breast, ovarian, uterine, colon:   Pt ***  Osteoporosis:   Discussed high calcium and vitamin D supplementation, weight bearing exercises Pt is *** supplementing with daily calcium/Vit D. *** Bone scan/dexa  Skin cancer:  Hx of skin CA -  NO*** Discussed atypical lesions   Colorectal cancer:   colonoscopy is ***    Lung cancer:   Low Dose CT Chest recommended if Age 97-80 years, 30 pack-year currently smoking OR have quit w/in 15years. Patient {DOES NOT does:27190::"does not"} qualify.   Social History   Tobacco Use  . Smoking status: Never Smoker  . Smokeless tobacco: Never Used  Substance Use Topics  . Alcohol use: Yes    Comment: occasional wine     ECG:***  Blood pressure/Hypertension: BP Readings from Last 3 Encounters:  10/04/19 134/72  09/18/19 122/67  07/05/19 117/67    Weight/Obesity: Wt Readings from Last 3 Encounters:  10/04/19 254 lb 9.6 oz (115.5 kg)  09/18/19 232 lb (105.2 kg)  07/05/19 233 lb (105.7 kg)   BMI Readings from Last 3 Encounters:  10/04/19 43.03 kg/m  09/18/19 39.21 kg/m  07/05/19 39.68 kg/m     Lipids:  Lab Results  Component Value Date   CHOL 193 09/30/2019   CHOL 169 08/08/2018   CHOL 172 07/11/2017   Lab Results  Component Value Date   HDL 53 09/30/2019   HDL 43 (L) 08/08/2018   HDL 40 (L) 07/11/2017   Lab Results  Component Value Date   LDLCALC 113 (H) 09/30/2019   LDLCALC 104 (H) 08/08/2018   LDLCALC 104 (H) 07/11/2017   Lab Results  Component Value Date   TRIG 152 (H) 09/30/2019   TRIG 120 08/08/2018   TRIG 161 (H)  07/11/2017   Lab Results  Component Value Date   CHOLHDL 3.6 09/30/2019   CHOLHDL 3.9 08/08/2018   CHOLHDL 4.3 07/11/2017   No results found for: LDLDIRECT Based on the results of lipid panel his/her cardiovascular risk factor ( using [place] )  in the next 10 years is: The 10-year ASCVD risk score Mikey Bussing DC Brooke Bonito., et al., 2013) is: 7.3%   Values used to calculate the score:     Age: 77 years     Sex: Female     Is Non-Hispanic African American: No     Diabetic: No     Tobacco smoker: No     Systolic Blood Pressure: 612 mmHg     Is BP treated: Yes     HDL Cholesterol: 53 mg/dL     Total Cholesterol: 193 mg/dL  Glucose:  Glucose  Date Value Ref Range Status  09/30/2019 122 (H) 65 - 99 mg/dL Final  01/23/2018 77 65 - 99 mg/dL Final   Glucose, Bld  Date Value Ref Range Status  08/08/2018 106 (H) 65 - 99 mg/dL Final    Comment:    .            Fasting reference interval . For someone without known diabetes, a glucose value between 100 and 125 mg/dL is consistent with prediabetes and should be confirmed with a follow-up test. .   09/08/2017 125 (H) 65 - 99 mg/dL Final  09/07/2017 148 (H) 65 - 99 mg/dL Final      Office Visit from 10/04/2019 in [facility]  AUDIT-C Score  1     Depression: Phq 9 is  {Desc; negative/positive:13464::"negative"} Depression screen [facility] 2/9 10/04/2019 09/18/2019 08/08/2018 07/11/2017 06/26/2017  Decreased Interest 0 - 0 0 0  Down, Depressed, Hopeless 0 0 0 0 0  PHQ - 2 Score 0 0 0 0 0  Altered sleeping 0 0 0 - -  Tired, decreased energy 1 0 0 - -  Change in appetite 0 0 0 - -  Feeling bad or failure about yourself  0 0 0 - -  Trouble concentrating 0 0 0 - -  Moving slowly or fidgety/restless 0 0 0 - -  Suicidal thoughts 0 0 0 - -  PHQ-9 Score 1 0 0 - -  Difficult doing work/chores Not difficult at all Not difficult at all Not difficult at all - -   Hypertension: BP Readings from Last 3 Encounters:  10/04/19  134/72  09/18/19 122/67  07/05/19 117/67   Obesity: Wt Readings from Last 3 Encounters:  10/04/19 254 lb 9.6 oz (115.5 kg)  09/18/19 232 lb (105.2 kg)  07/05/19 233 lb (105.7 kg)   BMI Readings  from Last 3 Encounters:  10/04/19 43.03 kg/m  09/18/19 39.21 kg/m  07/05/19 39.68 kg/m      Advanced Care Planning:  A voluntary discussion about advance care planning including the explanation and discussion of advance directives.   Discussed health care proxy and Living will, and the patient was able to identify a health care proxy as ***.   Patient {DOES_DOES QQP:61950} have a living will at present time.   Social History      She  reports that she has never smoked. She has never used smokeless tobacco. She reports current alcohol use. She reports that she does not use drugs.       Social History   Socioeconomic History  . Marital status: Divorced    Spouse name: Not on file  . Number of children: 2  . Years of education: 36  . Highest education level: Some college, no degree  Occupational History  . Occupation: retired  Tobacco Use  . Smoking status: Never Smoker  . Smokeless tobacco: Never Used  Substance and Sexual Activity  . Alcohol use: Yes    Comment: occasional wine  . Drug use: No  . Sexual activity: Yes  Other Topics Concern  . Not on file  Social History Narrative   Independent at baseline.   Social Determinants of Health   Financial Resource Strain:   . Difficulty of Paying Living Expenses: Not on file  Food Insecurity:   . Worried About Charity fundraiser in the Last Year: Not on file  . Ran Out of Food in the Last Year: Not on file  Transportation Needs:   . Lack of Transportation (Medical): Not on file  . Lack of Transportation (Non-Medical): Not on file  Physical Activity:   . Days of Exercise per Week: Not on file  . Minutes of Exercise per Session: Not on file  Stress:   . Feeling of Stress : Not on file  Social Connections:   . Frequency  of Communication with Friends and Family: Not on file  . Frequency of Social Gatherings with Friends and Family: Not on file  . Attends Religious Services: Not on file  . Active Member of Clubs or Organizations: Not on file  . Attends Archivist Meetings: Not on file  . Marital Status: Not on file    Family History        Family Status  Relation Name Status  . Ethlyn Daniels  Deceased  . Mother  Deceased at age 63  . Father  Deceased at age 68  . PGM  Deceased  . PGF  Deceased  . Sister  Alive  . Cousin  (Not Specified)  . Cousin  (Not Specified)  . Cousin  (Not Specified)  . MGM  Deceased  . MGF  Deceased  . Neg Hx  (Not Specified)        Her family history includes Aneurysm in her paternal grandfather; Breast cancer in her cousin, cousin, cousin, and paternal aunt; Breast cancer (age of onset: 25) in her mother; COPD in her father and paternal grandfather; Cancer in her father and mother; Heart attack in her paternal grandmother; Heart disease in her paternal grandmother; Hypertension in her mother; Migraines in her mother; Rectal cancer in her father; Stroke in her paternal grandmother. There is no history of Anesthesia problems or Diabetes.       Family History  Problem Relation Age of Onset  . Breast cancer Paternal Aunt   .  Breast cancer Mother 76  . Migraines Mother   . Cancer Mother   . Hypertension Mother   . Rectal cancer Father   . Cancer Father   . COPD Father   . Heart disease Paternal Grandmother   . Stroke Paternal Grandmother   . Heart attack Paternal Grandmother   . Aneurysm Paternal Grandfather   . COPD Paternal Grandfather   . Breast cancer Cousin   . Breast cancer Cousin   . Breast cancer Cousin   . Anesthesia problems Neg Hx   . Diabetes Neg Hx     Patient Active Problem List   Diagnosis Date Noted  . Preventative health care 08/08/2018  . Morbid obesity ([place]) 07/12/2017  . Allergic rhinitis 06/26/2017  . Hematuria 07/27/2016  .  Medication monitoring encounter 06/08/2016  . Degenerative joint disease (DJD) of hip 03/31/2016  . Hx of breast cancer 03/22/2016  . GERD (gastroesophageal reflux disease) 10/07/2015  . HTN (hypertension) 10/07/2015  . Depression, major, recurrent, moderate ([place]) 06/11/2015  . Anemia 05/15/2015  . Do not resuscitate status 05/15/2015  . Chronic systolic CHF (congestive heart failure) ([place]) 05/05/2015  . Left leg DVT ([place]) 05/05/2015  . Pulmonary hypertension ([place])   . 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 LBBB (left bundle branch block)-rate related  04/19/2015  . Chronic migraine without aura, with intractable migraine, so stated, with status migrainosus 11/04/2014  . Subscapularis (muscle) sprain 11/22/2013    Past Surgical History:  Procedure Laterality Date  . ABDOMINAL HYSTERECTOMY  1986  . ANAL FISSURE REPAIR    . ANTERIOR CERVICAL DECOMP/DISCECTOMY FUSION  01/23/2012   Procedure: ANTERIOR CERVICAL DECOMPRESSION/DISCECTOMY FUSION 1 LEVEL;  Surgeon: Elaina Hoops, MD;  Location: [place] [facility];  Service: Neurosurgery;  Laterality: N/A;  Cervical five-six anterior cervical discectomy fusion with plating  . APPENDECTOMY    . BREAST BIOPSY Left 06/2016   US biopsy  . BREAST SURGERY Left 2011   Breast lumpectomy  . CARDIAC CATHETERIZATION  2010   [facility]; pt states it was "clear"  . CARDIAC CATHETERIZATION N/A 10/28/2015   Procedure: Right/Left Heart Cath and Coronary Angiography;  Surgeon: Belva Crome, MD;  Location: [place] [facility];  Service: Cardiovascular;  Laterality: N/A;  . CARPAL TUNNEL RELEASE Bilateral   . CESAREAN SECTION  1982, 1979  . CHOLECYSTECTOMY    . COLONOSCOPY  01/16/2009   Dr Bary Castilla  . KNEE CARTILAGE SURGERY Left   . SHOULDER ARTHROSCOPY WITH SUBACROMIAL DECOMPRESSION, ROTATOR CUFF REPAIR AND BICEP TENDON REPAIR Right 11/22/2013   Procedure: RIGHT SHOULDER ATHROSCOPY OPEN SUBSCAP REPAIR DELTA-PECTORAL ;  Surgeon: Augustin Schooling, MD;  Location: [place];  Service: Orthopedics;  Laterality: Right;  . TARSAL TUNNEL RELEASE Left 1990  . TONSILLECTOMY    . TUBAL LIGATION       Current Outpatient Medications:  .  albuterol (VENTOLIN HFA) 108 (90 Base) MCG/ACT inhaler, Inhale 2 puffs into the lungs every 6 (six) hours as needed for wheezing or shortness of breath., Disp: 18 g, Rfl: 2 .  benzonatate (TESSALON) 100 MG capsule, Take 1 capsule (100 mg total) by mouth 3 (three) times daily as needed for cough., Disp: 30 capsule, Rfl: 0 .  budesonide-formoterol (SYMBICORT) 160-4.5 MCG/ACT inhaler, Inhale 2 puffs into the lungs 2 (two) times daily. Rinse out mouth and spit after each use, Disp: 1 Inhaler, Rfl: 3 .  carvedilol (COREG) 6.25 MG tablet, Take 1 tablet (6.25 mg total) by mouth 2 (two) times daily.,  Disp: 180 tablet, Rfl: 3 .  cetirizine (ZYRTEC) 10 MG tablet, Take 10 mg by mouth daily., Disp: , Rfl:  .  fluticasone (FLONASE) 50 MCG/ACT nasal spray, INSTILL 1 SPRAY INTO EACH NOSTRIL EVERY DAY AS NEEDED, Disp: 48 mL, Rfl: 3 .  losartan (COZAAR) 25 MG tablet, Take 1 tablet (25 mg total) by mouth daily., Disp: 90 tablet, Rfl: 3 .  PROAIR HFA 108 (90 Base) MCG/ACT inhaler, INHALE 2 PUFFS INTO THE LUNGS EVERY 4 (FOUR) HOURS AS NEEDED FOR WHEEZING OR SHORTNESS OF BREATH., Disp: 1 g, Rfl: 0 .  rivaroxaban (XARELTO) 20 MG TABS tablet, Take 1 tablet (20 mg total) by mouth daily., Disp: 90 tablet, Rfl: 1 .  spironolactone (ALDACTONE) 25 MG tablet, Take 1 tablet (25 mg total) by mouth daily. TAKE 1 TABLET BY MOUTH DAILY ON MONDAY,WEDNESDAY AND FRIDAYS., Disp: 45 tablet, Rfl: 3 .  tiZANidine (ZANAFLEX) 2 MG tablet, TAKE 1 TABLET BY MOUTH EVERY 8 HOURS AS NEEDED, Disp: 90 tablet, Rfl: 1 .  vitamin B-12 (CYANOCOBALAMIN) 500 MCG tablet, Take 2,500 mcg by mouth daily., Disp: , Rfl:  .  vitamin C (ASCORBIC ACID) 500 MG tablet, Take 500 mg by mouth daily., Disp: , Rfl:   Allergies  Allergen Reactions  . Gadolinium Derivatives Shortness Of Breath and Nausea  And Vomiting  . Levaquin [Levofloxacin In D5w] Itching    Was giving with vancomycin at same time - unsure which one pt had a reaction to  . Sulfa Antibiotics Swelling    "Ears swelled up like [place]"  . Vancomycin Itching    Was giving at same time as Levaquin - unsure which pt had reaction to  . Aripiprazole     Dry mouth with sores   . Codeine Itching and Rash  . Oxycodone Itching  . Penicillins Hives    Has patient had a PCN reaction causing immediate rash, facial/tongue/throat swelling, SOB or lightheadedness with hypotension: Yes Has patient had a PCN reaction causing severe rash involving mucus membranes or skin necrosis: No Has patient had a PCN reaction that required hospitalization No Has patient had a PCN reaction occurring within the last 10 years: No If all of the above answers are "NO", then may proceed with Cephalosporin use.    Patient Care Team: Delsa Grana, PA-C as PCP - General (Family Medicine) Belva Crome, MD as Consulting Physician (Cardiology) Kathrynn Ducking, MD as Consulting Physician (Neurology) Lucky Cowboy Erskine Squibb, MD as Referring Physician (Vascular Surgery)  Review of Systems   ***       Objective:   Vitals:  Vitals:   10/04/19 1052  BP: 134/72  Pulse: 99  Resp: 20  Temp: (!) 97.3 F (36.3 C)  TempSrc: Temporal  SpO2: 96%  Weight: 254 lb 9.6 oz (115.5 kg)  Height: 5' 4.5" (1.638 m)    Body mass index is 43.03 kg/m.  Physical Exam    Fall Risk: Fall Risk  10/04/2019 09/18/2019 08/08/2018 07/11/2017 06/26/2017  Falls in the past year? 0 0 0 Yes Yes  Comment - - - - -  Number falls in past yr: 0 0 0 1 1  Injury with Fall? 0 0 - No No  Risk Factor Category  - - - - -  Risk for fall due to : - - - - -  Risk for fall due to: Comment - - - - -  Follow up - - - - -    Functional Status Survey: Is the patient deaf  or have difficulty hearing?: No Does the patient have difficulty seeing, even when wearing glasses/contacts?: No Does the  patient have difficulty concentrating, remembering, or making decisions?: No Does the patient have difficulty walking or climbing stairs?: No Does the patient have difficulty dressing or bathing?: No Does the patient have difficulty doing errands alone such as visiting a doctor's office or shopping?: No   Assessment & Plan:    CPE completed today  . USPSTF grade A and B recommendations reviewed with patient; age-appropriate recommendations, preventive care, screening tests, etc discussed and encouraged; healthy living encouraged; see AVS for patient education given to patient  . Discussed importance of 150 minutes of physical activity weekly, AHA exercise recommendations given to pt in AVS/handout  . Discussed importance of healthy diet:  eating lean meats and proteins, avoiding trans fats and saturated fats, avoid simple sugars and excessive carbs in diet, eat 6 servings of fruit/vegetables daily and drink plenty of water and avoid sweet beverages.    . Recommended pt to do annual eye exam and routine dental exams/cleanings  . Depression, alcohol, fall screening completed as documented above and per flowsheets  . Reviewed Health Maintenance: Health Maintenance  Topic Date Due  . COLONOSCOPY  01/16/2014  . MAMMOGRAM  07/18/2018  . PAP SMEAR-Modifier  05/18/2019  . TETANUS/TDAP  05/17/2026  . INFLUENZA VACCINE  Completed  . Hepatitis C Screening  Completed  . HIV Screening  Completed    . Immunizations: Immunization History  Administered Date(s) Administered  . Influenza Inj Mdck Quad Pf 06/21/2019  . Influenza,inj,Quad PF,6+ Mos 07/01/2015, 05/17/2016, 06/26/2017, 08/08/2018  . Pneumococcal Polysaccharide-23 07/13/2007  . Td 09/05/2004  . Tdap 05/17/2016    ***  No orders of the defined types were placed in this encounter.       Delsa Grana, PA-C 10/04/19 11:13 AM  [facility]

## 2019-10-04 NOTE — Telephone Encounter (Signed)
Patient returning call for lab results. 

## 2019-10-04 NOTE — Telephone Encounter (Signed)
See result note.  

## 2019-10-04 NOTE — Progress Notes (Signed)
Patient ID: Patricia Fischer, female    DOB: 11/11/1955, 64 y.o.   MRN: [id number]  PCP: Delsa Grana, PA-C  Chief Complaint  Patient presents with  . Annual Exam    with pap    Subjective:   Patricia Fischer is a 64 y.o. female, presents to clinic with CC of the following:  HPI  Recent virtual encounter with COPD exacerbation, she is here for CPE, but earlier this week labs were done and they are abnormal with elevated sodium and chloride, done with cardiology but some confusion as to who ordered.  I receieved from cards yesterday, pt here asking about it today.  We decided to address abnormal labs and she will return next month or so for CPE.      Ref. Range 09/30/2019 08:10  Sodium Latest Ref Range: 134 - 144 mmol/L 155 (H)  Potassium Latest Ref Range: 3.5 - 5.2 mmol/L 4.5  Chloride Latest Ref Range: 96 - 106 mmol/L 110 (H)  CO2 Latest Ref Range: 20 - 29 mmol/L 23  Glucose Latest Ref Range: 65 - 99 mg/dL 122 (H)  BUN Latest Ref Range: 8 - 27 mg/dL 18  Creatinine Latest Ref Range: 0.57 - 1.00 mg/dL 0.85  Calcium Latest Ref Range: 8.7 - 10.3 mg/dL 9.5  BUN/Creatinine Ratio Latest Ref Range: 12 - 28  21  Alkaline Phosphatase Latest Ref Range: 39 - 117 IU/L 102  Albumin Latest Ref Range: 3.8 - 4.8 g/dL 4.3  AST Latest Ref Range: 0 - 40 IU/L 17  ALT Latest Ref Range: 0 - 32 IU/L 17  Total Protein Latest Ref Range: 6.0 - 8.5 g/dL 7.1  Total Bilirubin Latest Ref Range: 0.0 - 1.2 mg/dL <0.2  GFR, Est Non African American Latest Ref Range: >59 mL/min/1.73 73  GFR, Est African American Latest Ref Range: >59 mL/min/1.73 84  BILIRUBIN, DIRECT Latest Ref Range: 0.00 - 0.40 mg/dL 0.05   Patient's lipid profile showed mildly elevated triglycerides and LDL of 113, slightly higher than about a year ago.  She had mildly elevated white count. At her last visit she was having a lot of coughing and shortness of breath, using her inhaler a lot.  I did treat her for a COPD exacerbation,  treated with antibiotics, steroids and did initiate a maintenance inhaler.  Patient had recently finished the steroids just prior to labs earlier this week.  She reports significant improvement in her breathing she is using her inhaler much less often since starting the Symbicort inhaler.  She states that she did have some increased urine output over the past several days, decreased fluid intake was not as thirsty as normal, she denies any significant salt intake or salty foods states that she tries to be careful about a fairly strict low-salt diet.  She has not felt that she is dehydrated, denies dry mouth.   Patient asked me to check her carotid arteries while she is here today she states that she recently had an insurance health nurse come do a home assessment and the nurse stated that her carotid artery exam was abnormal - she would like it checked here today.      Patient Active Problem List   Diagnosis Date Noted  . Preventative health care 08/08/2018  . Morbid obesity ([place]) 07/12/2017  . Allergic rhinitis 06/26/2017  . Hematuria 07/27/2016  . Medication monitoring encounter 06/08/2016  . Degenerative joint disease (DJD) of hip 03/31/2016  . Hx of breast cancer 03/22/2016  .  GERD (gastroesophageal reflux disease) 10/07/2015  . HTN (hypertension) 10/07/2015  . Depression, major, recurrent, moderate ([place]) 06/11/2015  . Anemia 05/15/2015  . Do not resuscitate status 05/15/2015  . Chronic systolic CHF (congestive heart failure) ([place]) 05/05/2015  . Left leg DVT ([place]) 05/05/2015  . Pulmonary hypertension ([place])   . 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 LBBB (left bundle branch block)-rate related  04/19/2015  . Chronic migraine without aura, with intractable migraine, so stated, with status migrainosus 11/04/2014  . Subscapularis (muscle) sprain 11/22/2013      Current Outpatient Medications:  .  albuterol (VENTOLIN HFA) 108 (90 Base) MCG/ACT inhaler, Inhale 2 puffs into the lungs  every 6 (six) hours as needed for wheezing or shortness of breath., Disp: 18 g, Rfl: 2 .  benzonatate (TESSALON) 100 MG capsule, Take 1 capsule (100 mg total) by mouth 3 (three) times daily as needed for cough., Disp: 30 capsule, Rfl: 0 .  budesonide-formoterol (SYMBICORT) 160-4.5 MCG/ACT inhaler, Inhale 2 puffs into the lungs 2 (two) times daily. Rinse out mouth and spit after each use, Disp: 1 Inhaler, Rfl: 3 .  carvedilol (COREG) 6.25 MG tablet, Take 1 tablet (6.25 mg total) by mouth 2 (two) times daily., Disp: 180 tablet, Rfl: 3 .  cetirizine (ZYRTEC) 10 MG tablet, Take 10 mg by mouth daily., Disp: , Rfl:  .  fluticasone (FLONASE) 50 MCG/ACT nasal spray, INSTILL 1 SPRAY INTO EACH NOSTRIL EVERY DAY AS NEEDED, Disp: 48 mL, Rfl: 3 .  losartan (COZAAR) 25 MG tablet, Take 1 tablet (25 mg total) by mouth daily., Disp: 90 tablet, Rfl: 3 .  PROAIR HFA 108 (90 Base) MCG/ACT inhaler, INHALE 2 PUFFS INTO THE LUNGS EVERY 4 (FOUR) HOURS AS NEEDED FOR WHEEZING OR SHORTNESS OF BREATH., Disp: 1 g, Rfl: 0 .  rivaroxaban (XARELTO) 20 MG TABS tablet, Take 1 tablet (20 mg total) by mouth daily., Disp: 90 tablet, Rfl: 1 .  spironolactone (ALDACTONE) 25 MG tablet, Take 1 tablet (25 mg total) by mouth daily. TAKE 1 TABLET BY MOUTH DAILY ON MONDAY,WEDNESDAY AND FRIDAYS., Disp: 45 tablet, Rfl: 3 .  tiZANidine (ZANAFLEX) 2 MG tablet, TAKE 1 TABLET BY MOUTH EVERY 8 HOURS AS NEEDED, Disp: 90 tablet, Rfl: 1 .  vitamin B-12 (CYANOCOBALAMIN) 500 MCG tablet, Take 2,500 mcg by mouth daily., Disp: , Rfl:  .  vitamin C (ASCORBIC ACID) 500 MG tablet, Take 500 mg by mouth daily., Disp: , Rfl:    Allergies  Allergen Reactions  . Gadolinium Derivatives Shortness Of Breath and Nausea And Vomiting  . Levaquin [Levofloxacin In D5w] Itching    Was giving with vancomycin at same time - unsure which one pt had a reaction to  . Sulfa Antibiotics Swelling    "Ears swelled up like Dumbo"  . Vancomycin Itching    Was giving at same time  as Levaquin - unsure which pt had reaction to  . Aripiprazole     Dry mouth with sores   . Codeine Itching and Rash  . Oxycodone Itching  . Penicillins Hives    Has patient had a PCN reaction causing immediate rash, facial/tongue/throat swelling, SOB or lightheadedness with hypotension: Yes Has patient had a PCN reaction causing severe rash involving mucus membranes or skin necrosis: No Has patient had a PCN reaction that required hospitalization No Has patient had a PCN reaction occurring within the last 10 years: No If all of the above answers are "NO", then may proceed with Cephalosporin use.  Family History  Problem Relation Age of Onset  . Breast cancer Paternal Aunt   . Breast cancer Mother 76  . Migraines Mother   . Cancer Mother   . Hypertension Mother   . Rectal cancer Father   . Cancer Father   . COPD Father   . Heart disease Paternal Grandmother   . Stroke Paternal Grandmother   . Heart attack Paternal Grandmother   . Aneurysm Paternal Grandfather   . COPD Paternal Grandfather   . Breast cancer Cousin   . Breast cancer Cousin   . Breast cancer Cousin   . Anesthesia problems Neg Hx   . Diabetes Neg Hx      Social History   Socioeconomic History  . Marital status: Divorced    Spouse name: Not on file  . Number of children: 2  . Years of education: 30  . Highest education level: Some college, no degree  Occupational History  . Occupation: retired  Tobacco Use  . Smoking status: Never Smoker  . Smokeless tobacco: Never Used  Substance and Sexual Activity  . Alcohol use: Yes    Comment: occasional wine  . Drug use: No  . Sexual activity: Yes  Other Topics Concern  . Not on file  Social History Narrative   Independent at baseline.   Social Determinants of Health   Financial Resource Strain:   . Difficulty of Paying Living Expenses: Not on file  Food Insecurity:   . Worried About Charity fundraiser in the Last Year: Not on file  . Ran Out of  Food in the Last Year: Not on file  Transportation Needs:   . Lack of Transportation (Medical): Not on file  . Lack of Transportation (Non-Medical): Not on file  Physical Activity:   . Days of Exercise per Week: Not on file  . Minutes of Exercise per Session: Not on file  Stress:   . Feeling of Stress : Not on file  Social Connections:   . Frequency of Communication with Friends and Family: Not on file  . Frequency of Social Gatherings with Friends and Family: Not on file  . Attends Religious Services: Not on file  . Active Member of Clubs or Organizations: Not on file  . Attends Archivist Meetings: Not on file  . Marital Status: Not on file  Intimate Partner Violence:   . Fear of Current or Ex-Partner: Not on file  . Emotionally Abused: Not on file  . Physically Abused: Not on file  . Sexually Abused: Not on file    Chart Review Today: I personally reviewed active problem list, medication list, allergies, family history, social history, health maintenance, notes from last encounter, lab results, imaging with the patient/caregiver today.   Review of Systems  Constitutional: Negative.   HENT: Negative.   Eyes: Negative.   Respiratory: Negative.   Cardiovascular: Negative.   Gastrointestinal: Negative.   Endocrine: Negative.   Genitourinary: Negative.   Musculoskeletal: Negative.   Skin: Negative.   Allergic/Immunologic: Negative.   Neurological: Negative.   Hematological: Negative.   Psychiatric/Behavioral: Negative.   All other systems reviewed and are negative.      Objective:   Vitals:   10/04/19 1052  BP: 134/72  Pulse: 99  Resp: 20  Temp: (!) 97.3 F (36.3 C)  TempSrc: Temporal  SpO2: 96%  Weight: 254 lb 9.6 oz (115.5 kg)  Height: 5' 4.5" (1.638 m)    Body mass index is 43.03 kg/m.  Physical  Exam Vitals and nursing note reviewed.  Constitutional:      General: She is not in acute distress.    Appearance: Normal appearance. She is  well-developed. She is obese. She is not ill-appearing, toxic-appearing or diaphoretic.     Interventions: Face mask in place.  HENT:     Head: Normocephalic and atraumatic.     Right Ear: External ear normal.     Left Ear: External ear normal.     Mouth/Throat:     Lips: Pink.     Pharynx: Oropharynx is clear.     Comments: Slightly dry oral mucosa and tongue Eyes:     General: Lids are normal. No scleral icterus.       Right eye: No discharge.        Left eye: No discharge.     Conjunctiva/sclera: Conjunctivae normal.  Neck:     Vascular: Normal carotid pulses. No carotid bruit.     Trachea: Trachea and phonation normal. No tracheal deviation.  Cardiovascular:     Rate and Rhythm: Normal rate and regular rhythm.     Pulses: Normal pulses.          Radial pulses are 2+ on the right side and 2+ on the left side.       Posterior tibial pulses are 2+ on the right side and 2+ on the left side.     Heart sounds: Heart sounds are distant (difficult to auscultate, improved when patient leans forward). No murmur. No friction rub. No gallop.   Pulmonary:     Effort: Pulmonary effort is normal. No tachypnea, accessory muscle usage, prolonged expiration, respiratory distress or retractions.     Breath sounds: Normal breath sounds. No stridor or decreased air movement. No decreased breath sounds, wheezing, rhonchi or rales.     Comments: Increased AP diameter Chest:     Chest wall: No tenderness.  Abdominal:     General: Bowel sounds are normal. There is no distension.     Palpations: Abdomen is soft.     Tenderness: There is no abdominal tenderness. There is no guarding or rebound.  Musculoskeletal:        General: No deformity. Normal range of motion.     Cervical back: Normal range of motion and neck supple.     Right lower leg: No edema.     Left lower leg: No edema.  Lymphadenopathy:     Cervical: No cervical adenopathy.  Skin:    General: Skin is warm and dry.     Capillary  Refill: Capillary refill takes less than 2 seconds.     Coloration: Skin is not jaundiced or pale.     Findings: No rash.  Neurological:     Mental Status: She is alert and oriented to person, place, and time.     Motor: No abnormal muscle tone.     Gait: Gait normal.  Psychiatric:        Speech: Speech normal.        Behavior: Behavior normal.      Results for orders placed or performed in visit on 09/30/19  Lipid panel  Result Value Ref Range   Cholesterol, Total 193 100 - 199 mg/dL   Triglycerides 152 (H) 0 - 149 mg/dL   HDL 53 >39 mg/dL   VLDL Cholesterol Cal 27 5 - 40 mg/dL   LDL Chol Calc (NIH) 113 (H) 0 - 99 mg/dL   Chol/HDL Ratio 3.6 0.0 - 4.4 ratio  Hepatic  function panel  Result Value Ref Range   Total Protein 7.1 6.0 - 8.5 g/dL   Albumin 4.3 3.8 - 4.8 g/dL   Bilirubin Total <0.2 0.0 - 1.2 mg/dL   Bilirubin, Direct 0.05 0.00 - 0.40 mg/dL   Alkaline Phosphatase 102 39 - 117 IU/L   AST 17 0 - 40 IU/L   ALT 17 0 - 32 IU/L  Basic metabolic panel  Result Value Ref Range   Glucose 122 (H) 65 - 99 mg/dL   BUN 18 8 - 27 mg/dL   Creatinine, Ser 0.85 0.57 - 1.00 mg/dL   GFR calc non Af Amer 73 >59 mL/min/1.73   GFR calc Af Amer 84 >59 mL/min/1.73   BUN/Creatinine Ratio 21 12 - 28   Sodium 155 (H) 134 - 144 mmol/L   Potassium 4.5 3.5 - 5.2 mmol/L   Chloride 110 (H) 96 - 106 mmol/L   CO2 23 20 - 29 mmol/L   Calcium 9.5 8.7 - 10.3 mg/dL  CBC  Result Value Ref Range   WBC 10.9 (H) 3.4 - 10.8 x10E3/uL   RBC 4.54 3.77 - 5.28 x10E6/uL   Hemoglobin 13.1 11.1 - 15.9 g/dL   Hematocrit 39.3 34.0 - 46.6 %   MCV 87 79 - 97 fL   MCH 28.9 26.6 - 33.0 pg   MCHC 33.3 31.5 - 35.7 g/dL   RDW 13.4 11.7 - 15.4 %   Platelets 185 150 - 450 x10E3/uL        Assessment & Plan:      ICD-10-CM   1. Hypernatremia  E87.0 Osmolality, urine    TSH    COMPLETE METABOLIC PANEL WITH GFR    Osmolality   may be secondary to recent meds for COPD exacerbation, or lab error, decreased PO  intake, increase UOP, recheck labs, initiate hypernatremia workup  2. Chronic obstructive pulmonary disease, unspecified COPD type ([place])  J44.9    no coughing during visit, BS CTA A&P, significantly improved from last visit - continue symbicort  3. Seasonal allergic rhinitis, unspecified trigger  J30.2 fluticasone (FLONASE) 50 MCG/ACT nasal spray   med refill  4. Encounter for screening mammogram for breast cancer  Z12.31 MM 3D SCREEN BREAST BILATERAL    Recheck of chemistry with serum osmolality, urine osmolality and thyroid to determine etiology if she continues to have hypernatremia.  I did spend a good bit of time reviewing lab work with her, communicating with her cardiologist multiple times yesterday and today, chart review done.    Will do CPE at next visit, patient did prefer to address the abnormal labs while she is in town.  She is staying in [place] the majority of the time but comes here for all of her medical care and this is still her state of residency -likely this is why her lab work was confused with multiple virtual visits, patient unable to do lab work because she is out of town, and Covid pandemic has made things very complicated.  She was in agreement with stat labs today so that she could get the results before she leaves town in about 1 to 2 days.    Delsa Grana, PA-C 10/04/19 11:38 AM

## 2019-10-07 LAB — COMPLETE METABOLIC PANEL WITH GFR
AG Ratio: 1.6 (calc) (ref 1.0–2.5)
ALT: 18 U/L (ref 6–29)
AST: 19 U/L (ref 10–35)
Albumin: 4.2 g/dL (ref 3.6–5.1)
Alkaline phosphatase (APISO): 75 U/L (ref 37–153)
BUN: 11 mg/dL (ref 7–25)
CO2: 34 mmol/L — ABNORMAL HIGH (ref 20–32)
Calcium: 9.2 mg/dL (ref 8.6–10.4)
Chloride: 102 mmol/L (ref 98–110)
Creat: 0.74 mg/dL (ref 0.50–0.99)
GFR, Est African American: 99 mL/min/{1.73_m2} (ref >=60)
GFR, Est Non African American: 86 mL/min/{1.73_m2} (ref >=60)
Globulin: 2.7 g/dL (calc) (ref 1.9–3.7)
Glucose, Bld: 112 mg/dL — ABNORMAL HIGH (ref 65–99)
Potassium: 4.2 mmol/L (ref 3.5–5.3)
Sodium: 141 mmol/L (ref 135–146)
Total Bilirubin: 0.6 mg/dL (ref 0.2–1.2)
Total Protein: 6.9 g/dL (ref 6.1–8.1)

## 2019-10-07 LAB — TSH: TSH: 0.82 mIU/L (ref 0.40–4.50)

## 2019-10-07 LAB — OSMOLALITY: Osmolality: 297 mOsm/kg (ref 278–305)

## 2019-10-07 LAB — OSMOLALITY, URINE: Osmolality, Ur: 534 mOsm/kg (ref 50–1200)

## 2019-10-11 ENCOUNTER — Other Ambulatory Visit: Payer: Self-pay | Admitting: Family Medicine

## 2019-10-11 DIAGNOSIS — G2581 Restless legs syndrome: Secondary | ICD-10-CM

## 2019-10-11 DIAGNOSIS — M62838 Other muscle spasm: Secondary | ICD-10-CM

## 2019-11-04 ENCOUNTER — Encounter: Payer: Self-pay | Admitting: Interventional Cardiology

## 2019-11-04 ENCOUNTER — Telehealth: Payer: Self-pay | Admitting: Interventional Cardiology

## 2019-11-04 NOTE — Telephone Encounter (Signed)
   Primary Cardiologist: Sinclair Grooms, MD  Chart reviewed as part of pre-operative protocol coverage. Simple dental extractions are considered low risk procedures per guidelines and generally do not require any specific cardiac clearance. It is also generally accepted that for simple extractions and dental cleanings, there is no need to interrupt blood thinner therapy. We typically do not recommend stopping the blood thinner for 1-2 teeth extractions, but it appears she has already starting to hold Xarelto since 2/26. She will need to restart Xarelto as soon as possible after the tooth extraction.   SBE prophylaxis is not required for the patient.  I will route this recommendation to the requesting party via Epic fax function and remove from pre-op pool.  Please call with questions.   [place], [place] 11/04/2019, 3:05 PM

## 2019-11-04 NOTE — Telephone Encounter (Signed)
New message   1. What dental office are you calling from? Dr.  Donnita Falls  2. What is your office phone number? [phone]  3.  What is your fax number? [phone]  4. What type of procedure is the patient having performed? Tooth extraction    5. What date is procedure scheduled or is the patient there now? Patient is in the chair now  (if the patient is at the dentist's office question goes to their cardiologist if he/she is in the office.  If not, question should go to the DOD).   6. What is your question (ex. Antibiotics prior to procedure, holding medication-we need to know how long dentist wants pt to hold med)? Dentist office has questions about holding xarelto/blood thinner.

## 2019-11-04 NOTE — Telephone Encounter (Signed)
Spoke with representative at Dr. Wynonia Hazard office.  She states pt told them she started holding her Xarelto Friday in preparation for dental work today.  Spoke with Dr. Tamala Julian and he said ok to do extraction if Xarelto held 72hrs. Do not start back any sooner than tomorrow.  Advised representative of this information.  She was appreciative for assistance.

## 2019-11-04 NOTE — Telephone Encounter (Signed)
Patient states that her dentist was to fax over a surgical clearance for a tooth extraction that is to be done tomorrow. She wants to know if it has been signed and sent back. She states she has been off her rivaroxaban (XARELTO) 20 MG TABS tablet since Friday because this is what Dr. Tamala Julian has told her in the past.

## 2019-11-04 NOTE — Telephone Encounter (Signed)
I s/w the pt who states she is having a tooth extracted tomorrow morning. Pt states she has already been holing her Xarelto since Friday 11/01/19. Pt is currently in [place] and has broken a tooth. I asked the name of the Dentist who will be performing procedure.  DDS is Dr. Raeanne Gathers [address] [place] [address] [phone] (Fax)   I s/w the dental office and confirmed procedure the pt is having done tomorrow 8:50 am. See surgery clearance below. Dental office thanked me for my help today.     [facility] Pre-operative Risk Assessment    Request for surgical clearance: PT IS IN [place] AND HAS BROKEN A TOOTH STAT  1. What type of surgery is being performed? 1 TOOTH TO BE EXTRACTED   2. When is this surgery scheduled? 11/05/19    3. What type of clearance is required (medical clearance vs. Pharmacy clearance to hold med vs. Both)? BOTH  4. Are there any medications that need to be held prior to surgery and how long? XARELTO; PT HAS ALREADY BEEN HOLDING SINCE Friday 11/01/19   5. Practice name and name of physician performing surgery?  [facility]; DR. Nicki Reaper SKLENICKA  6. What is your office phone number [phone]    7.   What is your office fax number [phone]  8.   Anesthesia type (None, local, MAC, general) ? LOCAL   Julaine Hua 11/04/2019, 2:47 PM  _________________________________________________________________   (provider comments below)

## 2019-11-04 NOTE — Telephone Encounter (Signed)
forwarded to requesting party via manual fax and EPIC fax

## 2019-11-05 NOTE — Telephone Encounter (Signed)
I s/w the dental office and confirmed the office did receive clearance. They were very thankful of our help yesterday.

## 2019-11-05 NOTE — Telephone Encounter (Signed)
Follow Up  Patricia Fischer from Dental office is calling in to have paperwork faxed to [phone] also. States that they need the paperwork as well as the oral surgeon. Please assist with faxing clearance form to [phone].

## 2019-11-05 NOTE — Telephone Encounter (Signed)
   Clearance form sent as requested. Will remove from the preop pool.  Abigail Butts, PA-C 11/05/19; 9:39 AM

## 2019-12-04 ENCOUNTER — Other Ambulatory Visit: Payer: Self-pay | Admitting: Interventional Cardiology

## 2019-12-04 ENCOUNTER — Other Ambulatory Visit: Payer: Self-pay | Admitting: Family Medicine

## 2019-12-04 DIAGNOSIS — G2581 Restless legs syndrome: Secondary | ICD-10-CM

## 2019-12-04 DIAGNOSIS — M62838 Other muscle spasm: Secondary | ICD-10-CM

## 2019-12-05 NOTE — Telephone Encounter (Signed)
Pt last saw Dr Tamala Julian 07/05/19 telemedicine Covid-19, last labs 10/04/19 Creat 0.74, age 64, weight 115.5kg, CrCl 138.2, based on CrCl pt is on appropriate dosage of Xarelto 20mg  QD.  Will refill rx.

## 2019-12-25 NOTE — Progress Notes (Signed)
Cardiology Office Note:    Date:  12/27/2019   ID:  [name], DOB Aug 06, 1956, MRN [id number]  PCP:  Delsa Grana, PA-C  Cardiologist:  Sinclair Grooms, MD   Electrophysiologist:  None   Referring MD: Delsa Grana, PA-C   Chief Complaint:  Follow-up (CHF)    Patient Profile:    Patricia Fischer is a 64 y.o. female with:   Chronic systolic CHF w/ Improved LVF  Non-ischemic cardiomyopathy  Echocardiogram 12/16: EF 35-40  Echocardiogram 5/18: EF 30-35, Gr 1 DD  cMRI 12/18: EF 52  Hx of recurrent pulmonary embolism   On chronic anticoagulation - Rx w/ Rivaroxaban  Secondary pulmonary hypertension   Breast CA  Hypertension   LBBB   Prior CV studies: Cardiac MRI 08/21/17 EF 12/26/50; no LGE  Echocardiogram 01/09/17 Mild LVH, EF 30-35, diff HK, Gr 1 DD, trace MR/TR  Cardiac catheterization 10/28/15 LCx ost December 25, 2048 (prob cath spasm) EF 35-45  Carotid US 10/07/15 No sig ICA stenosis  Echocardiogram 08/25/15 EF 35-40, ant/ant-sept/apical/inf-apical HK, Gr 1 DD, mild MR, RA/LA upper limits of NL  History of Present Illness:    Patricia Fischer was last seen in 06/2019 by Dr. Tamala Julian via Telemedicine.  She returns for follow-up.  She is here alone.  Overall, she has been doing well.  She often visits her boyfriend in [place], [place].  While there, she is able to walk several blocks as well as [place] without having to stop to catch her breath.  She sleeps on 2 pillows chronically without change.  She has not had PND, significant leg swelling or syncope.  She has not had any bleeding issues.     Past Medical History:  Diagnosis Date  . Anxiety   . Asthma   . Blood transfusion [place]  . Breast cancer, left breast ([place]) 12-25-2009   DCIS  . Chronic systolic CHF w/ Improved LVF 05/05/2015   NICM // Dx 04/2015 - EF initially 25-30% with RV dysfunction // f/u echo 08/2015 technically difficult, EF 35-40%, anterior, anteroseptal, apical and inferoapical  severe hypokinesis suggestive of LAD territory ischemia/infarct, grade 1 DD, mild MR, RV normal. // Echo 5/18: mild LVH, EF 30-35, Gr 1 DD // cMRI 12/18: EF 26-Dec-2050   . Depression   . DVT (deep venous thrombosis) ([facility]) Aug. 2016   a. Dx 04/2015 - patient returned 05/2015 after noncompliance with Xarelto.  . Environmental allergies   . Essential hypertension   . Family Hx of adverse reaction to anesthesia    " My Mother would get deathly sick "  . Gastroesophageal reflux disease   . Head injury Dec 25, 2009  . Hemorrhoids   . History of cardiac catheterization    Cath 10/2015: oLCx 50 (prob cath induced spasm), no sig CAD, EF 35-45  . LBBB (left bundle branch block)   . Migraine HAs 12-26-2014  . Obesity   . Orthostatic hypotension   . Pneumonia 12-26-10  . [place] pulmonary embolus ([facility]) Aug. 2016   a. Dx 04/2015 - patient returned 05/2015 after noncompliance with Xarelto.  . Shortness of breath dyspnea   . Syncope    a. Recurrent syncope in 2014-12-26 felt 2/2 orthostasis in setting of PE.    Current Medications: Current Meds  Medication Sig  . albuterol (VENTOLIN HFA) 108 (90 Base) MCG/ACT inhaler Inhale 2 puffs into the lungs every 6 (six) hours as needed for wheezing or shortness of breath.  . budesonide-formoterol (SYMBICORT) 160-4.5  MCG/ACT inhaler Inhale 2 puffs into the lungs 2 (two) times daily. Rinse out mouth and spit after each use  . carvedilol (COREG) 6.25 MG tablet Take 1 tablet (6.25 mg total) by mouth 2 (two) times daily.  . cetirizine (ZYRTEC) 10 MG tablet Take 10 mg by mouth daily.  . fluticasone (FLONASE) 50 MCG/ACT nasal spray INSTILL 1 SPRAY INTO EACH NOSTRIL EVERY DAY AS NEEDED  . losartan (COZAAR) 25 MG tablet Take 1 tablet (25 mg total) by mouth daily.  Marland Kitchen spironolactone (ALDACTONE) 25 MG tablet Take 1 tablet (25 mg total) by mouth daily. TAKE 1 TABLET BY MOUTH DAILY ON MONDAY,WEDNESDAY AND FRIDAYS.  Marland Kitchen tiZANidine (ZANAFLEX) 2 MG tablet Take 2 mg by mouth every 6 (six) hours as needed for  muscle spasms.  . vitamin B-12 (CYANOCOBALAMIN) 500 MCG tablet Take 2,500 mcg by mouth daily.  . vitamin C (ASCORBIC ACID) 500 MG tablet Take 500 mg by mouth daily.  Alveda Reasons 20 MG TABS tablet TAKE 1 TABLET BY MOUTH EVERY DAY  . [DISCONTINUED] tiZANidine (ZANAFLEX) 2 MG tablet TAKE 1 TABLET BY MOUTH EVERY 8 HOURS AS NEEDED     Allergies:   Gadolinium derivatives, Levaquin [levofloxacin in d5w], Sulfa antibiotics, Vancomycin, Aripiprazole, Codeine, Oxycodone, and Penicillins   Social History   Tobacco Use  . Smoking status: Never Smoker  . Smokeless tobacco: Never Used  Substance Use Topics  . Alcohol use: Yes    Comment: occasional wine  . Drug use: No     Family Hx: The patient's family history includes Aneurysm in her paternal grandfather; Breast cancer in her cousin, cousin, cousin, and paternal aunt; Breast cancer (age of onset: 43) in her mother; COPD in her father and paternal grandfather; Cancer in her father and mother; Heart attack in her paternal grandmother; Heart disease in her paternal grandmother; Hypertension in her mother; Migraines in her mother; Rectal cancer in her father; Stroke in her paternal grandmother. There is no history of Anesthesia problems or Diabetes.  ROS   EKGs/Labs/Other Test Reviewed:    EKG:  EKG is   ordered today.  The ekg ordered today demonstrates normal sinus rhythm, HR 89, LBBB  Recent Labs: 09/30/2019: Hemoglobin 13.1; Platelets 185 10/04/2019: ALT 18; BUN 11; Creat 0.74; Potassium 4.2; Sodium 141; TSH 0.82   Recent Lipid Panel Lab Results  Component Value Date/Time   CHOL 193 09/30/2019 08:10 AM   TRIG 152 (H) 09/30/2019 08:10 AM   HDL 53 09/30/2019 08:10 AM   CHOLHDL 3.6 09/30/2019 08:10 AM   CHOLHDL 3.9 08/08/2018 09:54 AM   LDLCALC 113 (H) 09/30/2019 08:10 AM   LDLCALC 104 (H) 08/08/2018 09:54 AM    Physical Exam:    VS:  BP 126/90 (BP Location: Right Arm, Patient Position: Sitting, Cuff Size: Large)   Pulse 89   Ht 5'  4.5" (1.638 m)   Wt 252 lb (114.3 kg)   SpO2 96%   BMI 42.59 kg/m     Wt Readings from Last 3 Encounters:  12/27/19 251 lb 6.4 oz (114 kg)  12/27/19 252 lb (114.3 kg)  10/04/19 254 lb 9.6 oz (115.5 kg)     Constitutional:      Appearance: Healthy appearance. Not in distress.  Neck:     Thyroid: No thyromegaly.     Vascular: No carotid bruit. JVD normal.  Pulmonary:     Effort: Pulmonary effort is normal.     Breath sounds: No wheezing. No rales.  Cardiovascular:  Normal rate. Regular rhythm. Normal S1. Normal S2.     Murmurs: There is no murmur.  Edema:    Pretibial: bilateral trace edema of the pretibial area. Abdominal:     Palpations: Abdomen is soft. There is no hepatomegaly.  Skin:    General: Skin is warm and dry.  Neurological:     Mental Status: Alert and oriented to person, place and time.     Cranial Nerves: Cranial nerves are intact.       ASSESSMENT & PLAN:    1. Chronic systolic CHF (congestive heart failure) (HCC) Nonischemic cardiomyopathy.  NYHA II.  EF was previously 35-40% and has improved to 52% on cardiac MRI in 2018.  She is doing well.  Volume status is stable.  Continue current dose of carvedilol, losartan, spironolactone.  Recent creatinine, potassium normal.  2. History of pulmonary embolism 3. Chronic anticoagulation Recent hemoglobin, creatinine normal.  Continue long-term anticoagulation with Rivaroxaban.  4. Essential hypertension Diastolic reading is somewhat elevated today.  However, she just used her Symbicort prior to having her blood pressure checked.  She typically has blood pressure readings at home 120s/70s.  Blood pressure well controlled.  Continue current therapy.  5. LBBB (left bundle branch block)-rate related  Notes indicate that she has a history of left bundle branch block.  I reviewed several EKGs in the recent past which demonstrated a narrow QRS.  She likely has an intermittent, possibly rate related, left bundle  branch block.    Dispo:  Return in about 6 months (around 06/27/2020) for Routine Follow Up w/ Dr. Tamala Julian, in person.   Medication Adjustments/Labs and Tests Ordered: Current medicines are reviewed at length with the patient today.  Concerns regarding medicines are outlined above.  Tests Ordered: Orders Placed This Encounter  Procedures  . EKG 12-Lead   Medication Changes: No orders of the defined types were placed in this encounter.   Signed, Richardson Dopp, PA-C  12/27/2019 11:50 AM    [facility] [address], [place], [place]  [address] Phone: [phone]; Fax: [phone]

## 2019-12-26 ENCOUNTER — Ambulatory Visit
Admission: RE | Admit: 2019-12-26 | Discharge: 2019-12-26 | Disposition: A | Discharge status: Home / Self Care | Payer: Medicare Other | Source: Ambulatory Visit | Attending: Family Medicine | Admitting: Family Medicine

## 2019-12-26 DIAGNOSIS — Z1231 Encounter for screening mammogram for malignant neoplasm of breast: Secondary | ICD-10-CM

## 2019-12-27 ENCOUNTER — Encounter: Payer: Self-pay | Admitting: Physician Assistant

## 2019-12-27 ENCOUNTER — Ambulatory Visit: Payer: Medicare Other | Admitting: Physician Assistant

## 2019-12-27 ENCOUNTER — Encounter: Payer: Self-pay | Admitting: Family Medicine

## 2019-12-27 ENCOUNTER — Ambulatory Visit (INDEPENDENT_AMBULATORY_CARE_PROVIDER_SITE_OTHER): Payer: Medicare Other | Admitting: Family Medicine

## 2019-12-27 ENCOUNTER — Other Ambulatory Visit: Payer: Self-pay

## 2019-12-27 ENCOUNTER — Ambulatory Visit: Payer: Medicare Other | Admitting: Cardiology

## 2019-12-27 VITALS — BP 126/90 | HR 89 | Ht 64.5 in | Wt 252.0 lb

## 2019-12-27 VITALS — BP 122/80 | HR 98 | Temp 97.3°F | Resp 14 | Ht 65.0 in | Wt 251.4 lb

## 2019-12-27 DIAGNOSIS — E785 Hyperlipidemia, unspecified: Secondary | ICD-10-CM | POA: Insufficient documentation

## 2019-12-27 DIAGNOSIS — Z86711 Personal history of pulmonary embolism: Secondary | ICD-10-CM

## 2019-12-27 DIAGNOSIS — I5022 Chronic systolic (congestive) heart failure: Secondary | ICD-10-CM | POA: Diagnosis not present

## 2019-12-27 DIAGNOSIS — Z780 Asymptomatic menopausal state: Secondary | ICD-10-CM | POA: Insufficient documentation

## 2019-12-27 DIAGNOSIS — N393 Stress incontinence (female) (male): Secondary | ICD-10-CM

## 2019-12-27 DIAGNOSIS — I10 Essential (primary) hypertension: Secondary | ICD-10-CM

## 2019-12-27 DIAGNOSIS — M62838 Other muscle spasm: Secondary | ICD-10-CM

## 2019-12-27 DIAGNOSIS — Z7901 Long term (current) use of anticoagulants: Secondary | ICD-10-CM

## 2019-12-27 DIAGNOSIS — Z1211 Encounter for screening for malignant neoplasm of colon: Secondary | ICD-10-CM

## 2019-12-27 DIAGNOSIS — Z0000 Encounter for general adult medical examination without abnormal findings: Secondary | ICD-10-CM | POA: Diagnosis not present

## 2019-12-27 DIAGNOSIS — Z6841 Body Mass Index (BMI) 40.0 and over, adult: Secondary | ICD-10-CM

## 2019-12-27 DIAGNOSIS — I447 Left bundle-branch block, unspecified: Secondary | ICD-10-CM

## 2019-12-27 MED ORDER — TIZANIDINE HCL 2 MG PO TABS: 2.0000 mg | ORAL_TABLET | Freq: Four times a day (QID) | ORAL | 2 refills | Status: DC | PRN

## 2019-12-27 NOTE — Progress Notes (Signed)
Patient: Patricia Fischer, Female    DOB: 1955/12/19, 64 y.o.   MRN: [id number] Delsa Grana, PA-C Visit Date: 12/27/2019  Today's Provider: Delsa Grana, PA-C   Chief Complaint  Patient presents with  . Annual Exam   Subjective:   Annual physical exam:  Patricia Fischer is a 64 y.o. female who presents today for complete physical exam:  Exercise/Activity:   Walking several times a week (several football fields and walking 4 blocks to the beach) Diet/nutrition:   Cutting out sodas, small amount of tea with sugar, not eating out or eating bad, worried with trouble loosing weight Sleep:  Sleeps good  Pt seen here and over virtual encounter by me in the last 3-4 months, labs recently done as well for chronic conditions  Hx of the following (summary copied from cardiology visit today):  Hx of Chronic systolic CHF w/ Improved LVF ? Non-ischemic cardiomyopathy ? Echocardiogram 12/16: EF 35-40 ? Echocardiogram 5/18: EF 30-35, Gr 1 DD ? cMRI 12/18: EF 52  Hx of recurrent pulmonary embolism  ? On chronic anticoagulation - Rx w/ Rivaroxaban ? Secondary pulmonary hypertension   Breast CA  Hypertension  LBBB  Saw cardiology today, they said everything was good regarding renal function, lipids, heart and BP meds per pt  She is worried about her weight and trouble loosing weight despite efforts to walk more and watch what shes eating. She is not having any DOE or cardiac sx when walking.  No other exercise other than noted above No prior consult with weight management or dietician  USPSTF grade A and B recommendations - reviewed and addressed today  Depression:  Phq 9 completed today by patient, was reviewed by me with patient in the room PHQ score is neg, pt feels good, she does worry about son and becomes tearful during visit, but overall moods good, no depression PHQ 2/9 Scores 12/27/2019 10/04/2019 09/18/2019 08/08/2018  PHQ - 2 Score 0 0 0 0  PHQ- 9 Score 0 1 0 0    Depression screen [facility] 2/9 12/27/2019 10/04/2019 09/18/2019 08/08/2018 07/11/2017  Decreased Interest 0 0 - 0 0  Down, Depressed, Hopeless 0 0 0 0 0  PHQ - 2 Score 0 0 0 0 0  Altered sleeping 0 0 0 0 -  Tired, decreased energy 0 1 0 0 -  Change in appetite 0 0 0 0 -  Feeling bad or failure about yourself  0 0 0 0 -  Trouble concentrating 0 0 0 0 -  Moving slowly or fidgety/restless 0 0 0 0 -  Suicidal thoughts 0 0 0 0 -  PHQ-9 Score 0 1 0 0 -  Difficult doing work/chores Not difficult at all Not difficult at all Not difficult at all Not difficult at all -    Alcohol screening:   Office Visit from 12/27/2019 in [facility]  AUDIT-C Score  1      Immunizations and Health Maintenance: Health Maintenance  Topic Date Due  . COVID-19 Vaccine (1) Never done  . PAP SMEAR-Modifier  05/18/2019  . DEXA SCAN  12/26/2020 (Originally 10/28/2019)  . COLONOSCOPY  12/26/2020 (Originally 01/16/2014)  . PNA vac Low Risk Adult (1 of 2 - PCV13) 12/26/2020 (Originally 10/28/2019)  . INFLUENZA VACCINE  04/05/2020  . MAMMOGRAM  12/25/2020  . TETANUS/TDAP  05/17/2026  . Hepatitis C Screening  Completed  . HIV Screening  Completed     Hep C Screening:  Completed previously STD testing  and prevention (HIV/chl/gon/syphilis):  see above, no additional testing desired by pt today  HIV done, no other STD testing done  Intimate partner violence:none  Sexual History/Pain during Intercourse: Divorced  Menstrual History/LMP/Abnormal Bleeding: does not want to do PAP today, no concerns, no prior positive or abnormal PAPs or HPV testing per pt No LMP recorded. Patient has had a hysterectomy.  Incontinence Symptoms:  Urge incontinence for years, wears pads, sx not severe or daily  Breast cancer:  History of breast CA and mom with breast CA Last Mammogram: see HM list above  - done this morning, no concerns of new lumps, skin/nipple/breast changes Family hx of breast CA in mom BRCA gene  screening: unknown?  Pt is not sure any any of her family or personal BCA testing in the past  Cervical cancer screening: does not want to do today Pt  family hx of cancers - breast, ovarian, uterine, colon  Osteoporosis:   Discussion on osteoporosis per age, including high calcium and vitamin D supplementation, weight bearing exercises Pt is not supplementing with daily calcium/Vit D. Given info on daily supplement doses and osteoporosis prevention  Skin cancer:  Hx of skin CA -  NO Discussed atypical lesions   Colorectal cancer:   Colonoscopy is not up to date - pt wants to check on cologuard or do FIT test- she will check with her insurance and let us know - or she will do FIT test when she returns to [facility] in July Discussed concerning signs and sx of CRC, pt denies melena, hematochezia, change in BM/caliber   Lung cancer:   Low Dose CT Chest recommended if Age 42-80 years, 30 pack-year currently smoking OR have quit w/in 15years. Patient does not qualify.    Social History   Tobacco Use  . Smoking status: Never Smoker  . Smokeless tobacco: Never Used  Substance Use Topics  . Alcohol use: Yes    Comment: occasional wine  . Drug use: No       Office Visit from 12/27/2019 in [facility]  AUDIT-C Score  1      Family History  Problem Relation Age of Onset  . Breast cancer Paternal Aunt   . Breast cancer Mother 66  . Migraines Mother   . Cancer Mother   . Hypertension Mother   . Rectal cancer Father   . Cancer Father   . COPD Father   . Heart disease Paternal Grandmother   . Stroke Paternal Grandmother   . Heart attack Paternal Grandmother   . Aneurysm Paternal Grandfather   . COPD Paternal Grandfather   . Breast cancer Cousin   . Breast cancer Cousin   . Breast cancer Cousin   . Anesthesia problems Neg Hx   . Diabetes Neg Hx      Blood pressure/Hypertension: BP Readings from Last 3 Encounters:  12/27/19 122/80  12/27/19 126/90  10/04/19  134/72    Weight/Obesity: Wt Readings from Last 3 Encounters:  12/27/19 251 lb 6.4 oz (114 kg)  12/27/19 252 lb (114.3 kg)  10/04/19 254 lb 9.6 oz (115.5 kg)   BMI Readings from Last 3 Encounters:  12/27/19 41.84 kg/m  12/27/19 42.59 kg/m  10/04/19 43.03 kg/m     Lipids:  Lab Results  Component Value Date   CHOL 193 09/30/2019   CHOL 169 08/08/2018   CHOL 172 07/11/2017   Lab Results  Component Value Date   HDL 53 09/30/2019   HDL 43 (L) 08/08/2018  HDL 40 (L) 07/11/2017   Lab Results  Component Value Date   LDLCALC 113 (H) 09/30/2019   LDLCALC 104 (H) 08/08/2018   LDLCALC 104 (H) 07/11/2017   Lab Results  Component Value Date   TRIG 152 (H) 09/30/2019   TRIG 120 08/08/2018   TRIG 161 (H) 07/11/2017   Lab Results  Component Value Date   CHOLHDL 3.6 09/30/2019   CHOLHDL 3.9 08/08/2018   CHOLHDL 4.3 07/11/2017   No results found for: LDLDIRECT Based on the results of lipid panel his/her cardiovascular risk factor ( using [facility] )  in the next 10 years is: The 10-year ASCVD risk score Mikey Bussing DC Brooke Bonito., et al., 2013) is: 6.8%   Values used to calculate the score:     Age: 59 years     Sex: Female     Is Non-Hispanic African American: No     Diabetic: No     Tobacco smoker: No     Systolic Blood Pressure: 161 mmHg     Is BP treated: Yes     HDL Cholesterol: 53 mg/dL     Total Cholesterol: 193 mg/dL Glucose:  Glucose  Date Value Ref Range Status  09/30/2019 122 (H) 65 - 99 mg/dL Final  01/23/2018 77 65 - 99 mg/dL Final   Glucose, Bld  Date Value Ref Range Status  10/04/2019 112 (H) 65 - 99 mg/dL Final    Comment:    .            Fasting reference interval . For someone without known diabetes, a glucose value between 100 and 125 mg/dL is consistent with prediabetes and should be confirmed with a follow-up test. .   08/08/2018 106 (H) 65 - 99 mg/dL Final    Comment:    .            Fasting reference interval . For someone without known  diabetes, a glucose value between 100 and 125 mg/dL is consistent with prediabetes and should be confirmed with a follow-up test. .   09/08/2017 125 (H) 65 - 99 mg/dL Final   Hypertension: BP Readings from Last 3 Encounters:  12/27/19 122/80  12/27/19 126/90  10/04/19 134/72   Obesity: Wt Readings from Last 3 Encounters:  12/27/19 251 lb 6.4 oz (114 kg)  12/27/19 252 lb (114.3 kg)  10/04/19 254 lb 9.6 oz (115.5 kg)   BMI Readings from Last 3 Encounters:  12/27/19 41.84 kg/m  12/27/19 42.59 kg/m  10/04/19 43.03 kg/m      Advanced Care Planning:  A voluntary discussion about advance care planning including the explanation and discussion of advance directives.   Discussed health care proxy and Living will, and the patient was able to identify a health care proxy as her son Blannie Shedlock.   Patient does not have a living will at present time.   Social History      She        Social History   Socioeconomic History  . Marital status: Divorced    Spouse name: Not on file  . Number of children: 2  . Years of education: 78  . Highest education level: Some college, no degree  Occupational History  . Occupation: retired  Tobacco Use  . Smoking status: Never Smoker  . Smokeless tobacco: Never Used  Substance and Sexual Activity  . Alcohol use: Yes    Comment: occasional wine  . Drug use: No  . Sexual activity: Yes  Other Topics Concern  .  Not on file  Social History Narrative   Independent at baseline.   Social Determinants of Health   Financial Resource Strain:   . Difficulty of Paying Living Expenses:   Food Insecurity:   . Worried About Charity fundraiser in the Last Year:   . Arboriculturist in the Last Year:   Transportation Needs:   . Film/video editor (Medical):   Marland Kitchen Lack of Transportation (Non-Medical):   Physical Activity:   . Days of Exercise per Week:   . Minutes of Exercise per Session:   Stress:   . Feeling of Stress :   Social  Connections:   . Frequency of Communication with Friends and Family:   . Frequency of Social Gatherings with Friends and Family:   . Attends Religious Services:   . Active Member of Clubs or Organizations:   . Attends Archivist Meetings:   Marland Kitchen Marital Status:     Family History        Family History  Problem Relation Age of Onset  . Breast cancer Paternal Aunt   . Breast cancer Mother 43  . Migraines Mother   . Cancer Mother   . Hypertension Mother   . Rectal cancer Father   . Cancer Father   . COPD Father   . Heart disease Paternal Grandmother   . Stroke Paternal Grandmother   . Heart attack Paternal Grandmother   . Aneurysm Paternal Grandfather   . COPD Paternal Grandfather   . Breast cancer Cousin   . Breast cancer Cousin   . Breast cancer Cousin   . Anesthesia problems Neg Hx   . Diabetes Neg Hx     Patient Active Problem List   Diagnosis Date Noted  . Preventative health care 08/08/2018  . Morbid obesity ([place]) 07/12/2017  . Allergic rhinitis 06/26/2017  . Hematuria 07/27/2016  . Medication monitoring encounter 06/08/2016  . Degenerative joint disease (DJD) of hip 03/31/2016  . Hx of breast cancer 03/22/2016  . GERD (gastroesophageal reflux disease) 10/07/2015  . HTN (hypertension) 10/07/2015  . Depression, major, recurrent, moderate ([place]) 06/11/2015  . Anemia 05/15/2015  . Do not resuscitate status 05/15/2015  . Chronic systolic CHF (congestive heart failure) ([place]) 05/05/2015  . Left leg DVT ([place]) 05/05/2015  . Pulmonary hypertension ([place])   . 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 LBBB (left bundle branch block)-rate related  04/19/2015  . Chronic migraine without aura, with intractable migraine, so stated, with status migrainosus 11/04/2014  . Subscapularis (muscle) sprain 11/22/2013    Past Surgical History:  Procedure Laterality Date  . ABDOMINAL HYSTERECTOMY  1986  . ANAL FISSURE REPAIR    . ANTERIOR CERVICAL DECOMP/DISCECTOMY FUSION   01/23/2012   Procedure: ANTERIOR CERVICAL DECOMPRESSION/DISCECTOMY FUSION 1 LEVEL;  Surgeon: Elaina Hoops, MD;  Location: [facility];  Service: Neurosurgery;  Laterality: N/A;  Cervical five-six anterior cervical discectomy fusion with plating  . APPENDECTOMY    . BREAST BIOPSY Left 06/2016   US biopsy-PAPILLARY CYSTIC APOCRINE CHANGE   . BREAST SURGERY Left 2011   Breast lumpectomy  . CARDIAC CATHETERIZATION  2010   [facility]; pt states it was "clear"  . CARDIAC CATHETERIZATION N/A 10/28/2015   Procedure: Right/Left Heart Cath and Coronary Angiography;  Surgeon: Belva Crome, MD;  Location: [facility];  Service: Cardiovascular;  Laterality: N/A;  . CARPAL TUNNEL RELEASE Bilateral   . CESAREAN SECTION  1982, 1979  . CHOLECYSTECTOMY    .  COLONOSCOPY  01/16/2009   Dr Bary Castilla  . KNEE CARTILAGE SURGERY Left   . SHOULDER ARTHROSCOPY WITH SUBACROMIAL DECOMPRESSION, ROTATOR CUFF REPAIR AND BICEP TENDON REPAIR Right 11/22/2013   Procedure: RIGHT SHOULDER ATHROSCOPY OPEN SUBSCAP REPAIR DELTA-PECTORAL ;  Surgeon: Augustin Schooling, MD;  Location: [facility];  Service: Orthopedics;  Laterality: Right;  . TARSAL TUNNEL RELEASE Left 1990  . TONSILLECTOMY    . TUBAL LIGATION       Current Outpatient Medications:  .  albuterol (VENTOLIN HFA) 108 (90 Base) MCG/ACT inhaler, Inhale 2 puffs into the lungs every 6 (six) hours as needed for wheezing or shortness of breath., Disp: 18 g, Rfl: 2 .  budesonide-formoterol (SYMBICORT) 160-4.5 MCG/ACT inhaler, Inhale 2 puffs into the lungs 2 (two) times daily. Rinse out mouth and spit after each use, Disp: 1 Inhaler, Rfl: 3 .  carvedilol (COREG) 6.25 MG tablet, Take 1 tablet (6.25 mg total) by mouth 2 (two) times daily., Disp: 180 tablet, Rfl: 3 .  cetirizine (ZYRTEC) 10 MG tablet, Take 10 mg by mouth daily., Disp: , Rfl:  .  fluticasone (FLONASE) 50 MCG/ACT nasal spray, INSTILL 1 SPRAY INTO EACH NOSTRIL EVERY DAY AS NEEDED, Disp: 48 mL, Rfl: 3 .  losartan  (COZAAR) 25 MG tablet, Take 1 tablet (25 mg total) by mouth daily., Disp: 90 tablet, Rfl: 3 .  spironolactone (ALDACTONE) 25 MG tablet, Take 1 tablet (25 mg total) by mouth daily. TAKE 1 TABLET BY MOUTH DAILY ON MONDAY,WEDNESDAY AND FRIDAYS., Disp: 45 tablet, Rfl: 3 .  tiZANidine (ZANAFLEX) 2 MG tablet, Take 2 mg by mouth every 6 (six) hours as needed for muscle spasms., Disp: , Rfl:  .  vitamin B-12 (CYANOCOBALAMIN) 500 MCG tablet, Take 2,500 mcg by mouth daily., Disp: , Rfl:  .  vitamin C (ASCORBIC ACID) 500 MG tablet, Take 500 mg by mouth daily., Disp: , Rfl:  .  XARELTO 20 MG TABS tablet, TAKE 1 TABLET BY MOUTH EVERY DAY, Disp: 90 tablet, Rfl: 1  Allergies  Allergen Reactions  . Gadolinium Derivatives Shortness Of Breath and Nausea And Vomiting  . Levaquin [Levofloxacin In D5w] Itching    Was giving with vancomycin at same time - unsure which one pt had a reaction to  . Sulfa Antibiotics Swelling    "Ears swelled up like Dumbo"  . Vancomycin Itching    Was giving at same time as Levaquin - unsure which pt had reaction to  . Aripiprazole     Dry mouth with sores   . Codeine Itching and Rash  . Oxycodone Itching  . Penicillins Hives    Has patient had a PCN reaction causing immediate rash, facial/tongue/throat swelling, SOB or lightheadedness with hypotension: Yes Has patient had a PCN reaction causing severe rash involving mucus membranes or skin necrosis: No Has patient had a PCN reaction that required hospitalization No Has patient had a PCN reaction occurring within the last 10 years: No If all of the above answers are "NO", then may proceed with Cephalosporin use.    Patient Care Team: Delsa Grana, PA-C as PCP - General (Family Medicine) Belva Crome, MD as PCP - Cardiology (Cardiology) Belva Crome, MD as Consulting Physician (Cardiology) Kathrynn Ducking, MD as Consulting Physician (Neurology) Lucky Cowboy Erskine Squibb, MD as Referring Physician (Vascular Surgery)  Review of  Systems  Constitutional: Negative.  Negative for activity change, appetite change, fatigue and unexpected weight change.  HENT: Negative.   Eyes: Negative.   Respiratory: Negative.  Negative for shortness of breath.   Cardiovascular: Negative.  Negative for chest pain, palpitations and leg swelling.  Gastrointestinal: Negative.  Negative for abdominal pain and blood in stool.  Endocrine: Negative.   Genitourinary: Negative.   Musculoskeletal: Negative.  Negative for arthralgias, gait problem, joint swelling and myalgias.  Skin: Negative.  Negative for color change, pallor and rash.  Allergic/Immunologic: Negative.   Neurological: Negative.  Negative for syncope and weakness.  Hematological: Negative.   Psychiatric/Behavioral: Negative.  Negative for confusion, dysphoric mood, self-injury and suicidal ideas. The patient is not nervous/anxious.      I personally reviewed active problem list, medication list, allergies, family history, social history, health maintenance, notes from last encounter, lab results, imaging with the patient/caregiver today.        Objective:   Vitals:  Vitals:   12/27/19 1123  BP: 122/80  Pulse: 98  Resp: 14  Temp: (!) 97.3 F (36.3 C)  SpO2: 95%  Weight: 251 lb 6.4 oz (114 kg)  Height: '5\' 5"'  (1.651 m)    Body mass index is 41.84 kg/m.  Physical Exam Vitals and nursing note reviewed.  Constitutional:      General: She is not in acute distress.    Appearance: Normal appearance. She is well-developed. She is morbidly obese. She is not ill-appearing, toxic-appearing or diaphoretic.  HENT:     Head: Normocephalic and atraumatic.     Right Ear: External ear normal.     Left Ear: External ear normal.     Mouth/Throat:     Pharynx: Uvula midline.  Eyes:     General: Lids are normal. No scleral icterus.       Right eye: No discharge.        Left eye: No discharge.     Conjunctiva/sclera: Conjunctivae normal.  Neck:     Trachea: Phonation  normal. No tracheal deviation.  Cardiovascular:     Rate and Rhythm: Normal rate and regular rhythm.     Pulses: Normal pulses.          Radial pulses are 2+ on the right side and 2+ on the left side.       Posterior tibial pulses are 2+ on the right side and 2+ on the left side.     Heart sounds: Normal heart sounds. No murmur. No friction rub. No gallop.   Pulmonary:     Effort: Pulmonary effort is normal. No respiratory distress.     Breath sounds: Normal breath sounds. No stridor. No wheezing, rhonchi or rales.  Chest:     Chest wall: No tenderness.     Breasts:        Right: Normal. No swelling, bleeding, inverted nipple, mass, nipple discharge, skin change or tenderness.        Left: Normal. No swelling, bleeding, inverted nipple, mass, nipple discharge, skin change or tenderness.  Abdominal:     General: Bowel sounds are normal. There is no distension.     Palpations: Abdomen is soft.     Tenderness: There is no abdominal tenderness. There is no guarding or rebound.  Musculoskeletal:     Cervical back: Normal range of motion and neck supple.     Right lower leg: No edema.     Left lower leg: No edema.     Comments: Right shoulder limited ROM  Lymphadenopathy:     Cervical: No cervical adenopathy.     Upper Body:     Right upper body: No supraclavicular, axillary or  pectoral adenopathy.     Left upper body: No supraclavicular, axillary or pectoral adenopathy.  Skin:    General: Skin is warm and dry.     Capillary Refill: Capillary refill takes less than 2 seconds.     Coloration: Skin is not pale.     Findings: No rash.  Neurological:     Mental Status: She is alert and oriented to person, place, and time.     Motor: No abnormal muscle tone.     Gait: Gait normal.  Psychiatric:        Mood and Affect: Mood normal.        Speech: Speech normal.        Behavior: Behavior normal. Behavior is cooperative.       Fall Risk: Fall Risk  12/27/2019 10/04/2019 09/18/2019  08/08/2018 07/11/2017  Falls in the past year? 1 0 0 0 Yes  Comment - - - - -  Number falls in past yr: 1 0 0 0 1  Injury with Fall? 1 0 0 - No  Risk Factor Category  - - - - -  Risk for fall due to : - - - - -  Risk for fall due to: Comment - - - - -  Follow up - - - - -    Functional Status Survey: Is the patient deaf or have difficulty hearing?: No Does the patient have difficulty seeing, even when wearing glasses/contacts?: No Does the patient have difficulty concentrating, remembering, or making decisions?: No Does the patient have difficulty walking or climbing stairs?: No Does the patient have difficulty dressing or bathing?: No Does the patient have difficulty doing errands alone such as visiting a doctor's office or shopping?: No   Assessment & Plan:    CPE completed today  . USPSTF grade A and B recommendations reviewed with patient; age-appropriate recommendations, preventive care, screening tests, etc discussed and encouraged; healthy living encouraged; see AVS for patient education given to patient  . Discussed importance of 150 minutes of physical activity weekly, AHA exercise recommendations given to pt in AVS/handout  . Discussed importance of healthy diet:  eating lean meats and proteins, avoiding trans fats and saturated fats, avoid simple sugars and excessive carbs in diet, eat 6 servings of fruit/vegetables daily and drink plenty of water and avoid sweet beverages.    . Recommended pt to do annual eye exam and routine dental exams/cleanings  . Depression, alcohol, fall screening completed as documented above and per flowsheets  . Reviewed Health Maintenance:  Addressed CRC screening - orders in and options explained to pt Dexa ordered - explained prevention of osteoporosis and avoiding falls, can get Dexa at [facility]  Topic Date Due  . COVID-19 Vaccine (1) Never done  . PAP SMEAR-Modifier  12/27/2019 (Originally 05/18/2019)  . DEXA SCAN   12/26/2020 (Originally 10/28/2019)  . COLONOSCOPY  12/26/2020 (Originally 01/16/2014)  . PNA vac Low Risk Adult (1 of 2 - PCV13) 12/26/2020 (Originally 10/28/2019)  . INFLUENZA VACCINE  04/05/2020  . MAMMOGRAM  12/25/2020  . TETANUS/TDAP  05/17/2026  . Hepatitis C Screening  Completed  . HIV Screening  Completed    . Immunizations: Immunization History  Administered Date(s) Administered  . Influenza Inj Mdck Quad Pf 06/21/2019  . Influenza,inj,Quad PF,6+ Mos 07/01/2015, 05/17/2016, 06/26/2017, 08/08/2018  . Pneumococcal Polysaccharide-23 07/13/2007  . Td 09/05/2004  . Tdap 05/17/2016          ICD-10-CM   1. Adult general medical exam  Z00.00   2. Screening for malignant neoplasm of colon  Z12.11 Fecal Globin By Immunochemistry   discussed options with pt - she did not want to do now, will let us know in the next couple days  3. Class 3 severe obesity with body mass index (BMI) of 40.0 to 44.9 in adult, unspecified obesity type, unspecified whether serious comorbidity present ([place])  E66.01    Z68.41    counseling about diet and exercise, urged reducing caloried and increasing time and intensity of physical activity, offered referrals to weight management/nut  4. Postmenopausal estrogen deficiency  Z78.0 DG Bone Density   due for dexa, discussed Vit D and calcium supplementation, avoiding falls  5. Muscle spasms of lower extremity, unspecified laterality  M62.838 tiZANidine (ZANAFLEX) 2 MG tablet   refill on meds sent to pharmacy in [place] - pt will be out soon, using prn for leg and back spasms  6. Stress incontinence  N39.3    discussed, pt did not want any referrals for pelvic flood therapy or urology etc, mild sx, encouraged her to let us know if worsening  7. Hyperlipidemia, unspecified hyperlipidemia type  E78.5    reviewed last labs, not on meds, LDL elevated, but pt states cardiology was happy with last labs, will review cardiology note when completed  8. Essential  hypertension  I10    well controlled today       Delsa Grana, PA-C 12/27/19 11:35 AM  [facility]

## 2019-12-27 NOTE — Patient Instructions (Signed)
Medication Instructions:  Continue current medications   *If you need a refill on your cardiac medications before your next appointment, please call your pharmacy*   Follow-Up: At [facility], you and your health needs are our priority.  As part of our continuing mission to provide you with exceptional heart care, we have created designated Provider Care Teams.  These Care Teams include your primary Cardiologist (physician) and Advanced Practice Providers (APPs -  Physician Assistants and Nurse Practitioners) who all work together to provide you with the care you need, when you need it.  We recommend signing up for the patient portal called "MyChart".  Sign up information is provided on this After Visit Summary.  MyChart is used to connect with patients for Virtual Visits (Telemedicine).  Patients are able to view lab/test results, encounter notes, upcoming appointments, etc.  Non-urgent messages can be sent to your provider as well.   To learn more about what you can do with MyChart, go to [email or web address].    Your next appointment:   6 month(s)  The format for your next appointment:   In Person  Provider:   Daneen Schick, MD

## 2019-12-27 NOTE — Patient Instructions (Addendum)
Vit D 1000 IU and Calcium 1200 mg daily or in divided doses    Preventive Care 65 Years and Older, Female Preventive care refers to lifestyle choices and visits with your health care provider that can promote health and wellness. This includes:  A yearly physical exam. This is also called an annual well check.  Regular dental and eye exams.  Immunizations.  Screening for certain conditions.  Healthy lifestyle choices, such as diet and exercise. What can I expect for my preventive care visit? Physical exam Your health care provider will check:  Height and weight. These may be used to calculate body mass index (BMI), which is a measurement that tells if you are at a healthy weight.  Heart rate and blood pressure.  Your skin for abnormal spots. Counseling Your health care provider may ask you questions about:  Alcohol, tobacco, and drug use.  Emotional well-being.  Home and relationship well-being.  Sexual activity.  Eating habits.  History of falls.  Memory and ability to understand (cognition).  Work and work Statistician.  Pregnancy and menstrual history. What immunizations do I need?  Influenza (flu) vaccine  This is recommended every year. Tetanus, diphtheria, and pertussis (Tdap) vaccine  You may need a Td booster every 10 years. Varicella (chickenpox) vaccine  You may need this vaccine if you have not already been vaccinated. Zoster (shingles) vaccine  You may need this after age 77. Pneumococcal conjugate (PCV13) vaccine  One dose is recommended after age 15. Pneumococcal polysaccharide (PPSV23) vaccine  One dose is recommended after age 78. Measles, mumps, and rubella (MMR) vaccine  You may need at least one dose of MMR if you were born in 1957 or later. You may also need a second dose. Meningococcal conjugate (MenACWY) vaccine  You may need this if you have certain conditions. Hepatitis A vaccine  You may need this if you have certain  conditions or if you travel or work in places where you may be exposed to hepatitis A. Hepatitis B vaccine  You may need this if you have certain conditions or if you travel or work in places where you may be exposed to hepatitis B. Haemophilus influenzae type b (Hib) vaccine  You may need this if you have certain conditions. You may receive vaccines as individual doses or as more than one vaccine together in one shot (combination vaccines). Talk with your health care provider about the risks and benefits of combination vaccines. What tests do I need? Blood tests  Lipid and cholesterol levels. These may be checked every 5 years, or more frequently depending on your overall health.  Hepatitis C test.  Hepatitis B test. Screening  Lung cancer screening. You may have this screening every year starting at age 62 if you have a 30-pack-year history of smoking and currently smoke or have quit within the past 15 years.  Colorectal cancer screening. All adults should have this screening starting at age 13 and continuing until age 66. Your health care provider may recommend screening at age 90 if you are at increased risk. You will have tests every 1-10 years, depending on your results and the type of screening test.  Diabetes screening. This is done by checking your blood sugar (glucose) after you have not eaten for a while (fasting). You may have this done every 1-3 years.  Mammogram. This may be done every 1-2 years. Talk with your health care provider about how often you should have regular mammograms.  BRCA-related cancer screening. This  may be done if you have a family history of breast, ovarian, tubal, or peritoneal cancers. Other tests  Sexually transmitted disease (STD) testing.  Bone density scan. This is done to screen for osteoporosis. You may have this done starting at age 75. Follow these instructions at home: Eating and drinking  Eat a diet that includes fresh fruits and  vegetables, whole grains, lean protein, and low-fat dairy products. Limit your intake of foods with high amounts of sugar, saturated fats, and salt.  Take vitamin and mineral supplements as recommended by your health care provider.  Do not drink alcohol if your health care provider tells you not to drink.  If you drink alcohol: ? Limit how much you have to 0-1 drink a day. ? Be aware of how much alcohol is in your drink. In the [place] one drink equals one 12 oz bottle of beer (355 mL), one 5 oz glass of wine (148 mL), or one 1 oz glass of hard liquor (44 mL). Lifestyle  Take daily care of your teeth and gums.  Stay active. Exercise for at least 30 minutes on 5 or more days each week.  Do not use any products that contain nicotine or tobacco, such as cigarettes, e-cigarettes, and chewing tobacco. If you need help quitting, ask your health care provider.  If you are sexually active, practice safe sex. Use a condom or other form of protection in order to prevent STIs (sexually transmitted infections).  Talk with your health care provider about taking a low-dose aspirin or statin. What's next?  Go to your health care provider once a year for a well check visit.  Ask your health care provider how often you should have your eyes and teeth checked.  Stay up to date on all vaccines. This information is not intended to replace advice given to you by your health care provider. Make sure you discuss any questions you have with your health care provider. Document Revised: 08/16/2018 Document Reviewed: 08/16/2018 Elsevier Patient Education  2020 [facility]) Exercise Recommendation  Being physically active is important to prevent heart disease and stroke, the nation's No. 1and No. 5killers. To improve overall cardiovascular health, we suggest at least 150 minutes per week of moderate exercise or 75 minutes per week of vigorous exercise (or a combination of  moderate and vigorous activity). Thirty minutes a day, five times a week is an easy goal to remember. You will also experience benefits even if you divide your time into two or three segments of 10 to 15 minutes per day.  For people who would benefit from lowering their blood pressure or cholesterol, we recommend 40 minutes of aerobic exercise of moderate to vigorous intensity three to four times a week to lower the risk for heart attack and stroke.  Physical activity is anything that makes you move your body and burn calories.  This includes things like climbing stairs or playing sports. Aerobic exercises benefit your heart, and include walking, jogging, swimming or biking. Strength and stretching exercises are best for overall stamina and flexibility.  The simplest, positive change you can make to effectively improve your heart health is to start walking. It's enjoyable, free, easy, social and great exercise. A walking program is flexible and boasts high success rates because people can stick with it. It's easy for walking to become a regular and satisfying part of life.   For Overall Cardiovascular Health:  At least 30 minutes of moderate-intensity aerobic  activity at least 5 days per week for a total of 150  OR   At least 25 minutes of vigorous aerobic activity at least 3 days per week for a total of 75 minutes; or a combination of moderate- and vigorous-intensity aerobic activity  AND   Moderate- to high-intensity muscle-strengthening activity at least 2 days per week for additional health benefits.  For Lowering Blood Pressure and Cholesterol  An average 40 minutes of moderate- to vigorous-intensity aerobic activity 3 or 4 times per week  What if I can't make it to the time goal? Something is always better than nothing! And everyone has to start somewhere. Even if you've been sedentary for years, today is the day you can begin to make healthy changes in your life. If you don't  think you'll make it for 30 or 40 minutes, set a reachable goal for today. You can work up toward your overall goal by increasing your time as you get stronger. Don't let all-or-nothing thinking rob you of doing what you can every day.  Source:http://www.heart.org    Preventing Osteoporosis, Adult Osteoporosis is a condition that causes the bones to lose density. This means that the bones become thinner, and the normal spaces in bone tissue become larger. Low bone density can make the bones weak and cause them to break more easily. Osteoporosis cannot always be prevented, but you can take steps to lower your risk of developing this condition. How can this condition affect me? If you develop osteoporosis, you will be more likely to break bones in your wrist, spine, or hip. Even a minor accident or injury can be enough to break weak bones. The bones will also be slower to heal. Osteoporosis can cause other problems as well, such as a stooped posture or trouble with movement. Osteoporosis can occur with aging. As you get older, you may lose bone tissue more quickly, or it may be replaced more slowly. Osteoporosis is more likely to develop if you have poor nutrition or do not get enough calcium or vitamin D. Other lifestyle factors can also play a role. By eating a well-balanced diet and making lifestyle changes, you can help keep your bones strong and healthy, lowering your chances of developing osteoporosis. What can increase my risk? The following factors may make you more likely to develop osteoporosis:  Having a family history of the condition.  Having poor nutrition or not getting enough calcium or vitamin D.  Using certain medicines, such as steroid medicines or antiseizure medicines.  Being any of the following: ? 7 years of age or older. ? Female. ? A woman who has gone through menopause (is postmenopausal). ? White (Caucasian) or of Asian descent.  Smoking or having a history of  smoking.  Not being physically active (being sedentary).  Having a small body frame. What actions can I take to prevent this?  Get enough calcium   Make sure you get enough calcium every day. Calcium is the most important mineral for bone health. Most people can get enough calcium from their diet, but supplements may be recommended for people who are at risk for osteoporosis. Follow these guidelines: ? If you are age 61 or younger, aim to get 1,000 mg of calcium every day. ? If you are older than age 43, aim to get 1,200 mg of calcium every day.  Good sources of calcium include: ? Dairy products, such as low-fat or nonfat milk, cheese, and yogurt. ? Dark green leafy vegetables, such as  bok choy and broccoli. ? Foods that have had calcium added to them (calcium-fortified foods), such as orange juice, cereal, bread, soy beverages, and tofu products. ? Nuts, such as almonds.  Check nutrition labels to see how much calcium is in a food or drink. Get enough vitamin D  Try to get enough vitamin D every day. Vitamin D is the most essential vitamin for bone health. It helps the body absorb calcium. Follow these guidelines for how much vitamin D to get from food: ? If you are age 71 or younger, aim to get at least 600 international units (IU) every day. Your health care provider may suggest more. ? If you are older than age 23, aim to get at least 800 international units every day. Your health care provider may suggest more.  Good sources of vitamin D in your diet include: ? Egg yolks. ? Oily fish, such as salmon, sardines, and tuna. ? Milk and cereal fortified with vitamin D.  Your body also makes vitamin D when you are out in the sun. Exposing the bare skin on your face, arms, legs, or back to the sun for no more than 30 minutes a day, 2 times a week is more than enough. Beyond that, make sure you use sunblock to protect your skin from sunburn, which increases your risk for skin  cancer. Exercise  Stay active and get exercise every day.  Ask your health care provider what types of exercise are best for you. Weight-bearing and strength-building activities are important for building and maintaining healthy bones. Some examples of these types of activities include: ? Walking and hiking. ? Jogging and running. ? Dancing. ? Gym exercises. ? Lifting weights. ? Tennis and racquetball. ? Climbing stairs. ? Aerobics. Make other lifestyle changes  Do not use any products that contain nicotine or tobacco, such as cigarettes, e-cigarettes, and chewing tobacco. If you need help quitting, ask your health care provider.  Lose weight if you are overweight.  If you drink alcohol: ? Limit how much you use to:  0-1 drink a day for nonpregnant women.  0-2 drinks a day for men. ? Be aware of how much alcohol is in your drink. In the [place] one drink equals one 12 oz bottle of beer (355 mL), one 5 oz glass of wine (148 mL), or one 1 oz glass of hard liquor (44 mL). Where to find support If you need help making changes to prevent osteoporosis, talk with your health care provider. You can ask for a referral to a diet and nutrition specialist (dietitian) and a physical therapist. Where to find more information Learn more about osteoporosis from:  NIH Osteoporosis and Related [place]: www.bones.SouthExposed.es  [place] Office on [employer]: [place]Signs.tn  [place]: EquipmentWeekly.com.ee Summary  Osteoporosis is a condition that causes weak bones that are more likely to break.  Eat a healthy diet, making sure you get enough calcium and vitamin D, and stay active by getting regular exercise to help prevent osteoporosis.  Other ways to reduce your risk of osteoporosis include maintaining a healthy weight and avoiding alcohol and products that contain nicotine or tobacco. This information is not intended to replace advice given to  you by your health care provider. Make sure you discuss any questions you have with your health care provider. Document Revised: 03/22/2019 Document Reviewed: 03/22/2019 Elsevier Patient Education  [place].

## 2020-01-07 ENCOUNTER — Other Ambulatory Visit: Payer: Self-pay | Admitting: Family Medicine

## 2020-01-07 DIAGNOSIS — M62838 Other muscle spasm: Secondary | ICD-10-CM

## 2020-01-08 NOTE — Telephone Encounter (Signed)
Did you figure out if pt has her meds? The Rx came back to me refused but without any answer?  Please advise, Thanks LT

## 2020-01-09 NOTE — Telephone Encounter (Signed)
The phamacy has

## 2020-01-21 ENCOUNTER — Telehealth: Payer: Self-pay | Admitting: Emergency Medicine

## 2020-01-21 NOTE — Telephone Encounter (Signed)
She can schedule a virtual or call urgent care

## 2020-01-21 NOTE — Telephone Encounter (Signed)
Copied from [place] [id number]. Topic: General - Inquiry >> Jan 20, 2020 11:25 AM Mathis Bud wrote: Reason for CRM: Patient called stating she is currently in [facility].  Patient believes she is getting bronchitis and is wondering if PCP can call in a zpac.  PHARMACY [id number] - [place], [place] - [address]  Phone:  [phone] Fax:  [phone]

## 2020-01-22 ENCOUNTER — Encounter: Payer: Self-pay | Admitting: Family Medicine

## 2020-01-22 ENCOUNTER — Telehealth (INDEPENDENT_AMBULATORY_CARE_PROVIDER_SITE_OTHER): Payer: Medicare Other | Admitting: Family Medicine

## 2020-01-22 VITALS — BP 120/70 | HR 86 | Ht 65.0 in | Wt 251.0 lb

## 2020-01-22 DIAGNOSIS — J441 Chronic obstructive pulmonary disease with (acute) exacerbation: Secondary | ICD-10-CM | POA: Diagnosis not present

## 2020-01-22 DIAGNOSIS — J449 Chronic obstructive pulmonary disease, unspecified: Secondary | ICD-10-CM | POA: Insufficient documentation

## 2020-01-22 MED ORDER — DOXYCYCLINE HYCLATE 100 MG PO TABS: 100.0000 mg | ORAL_TABLET | Freq: Two times a day (BID) | ORAL | 0 refills | Status: AC

## 2020-01-22 MED ORDER — PREDNISONE 20 MG PO TABS: ORAL_TABLET | ORAL | 0 refills | Status: DC

## 2020-01-22 MED ORDER — MUCINEX 600 MG PO TB12: 600.0000 mg | ORAL_TABLET | Freq: Two times a day (BID) | ORAL | 0 refills | Status: AC

## 2020-01-22 NOTE — Progress Notes (Signed)
Name: Patricia Fischer   MRN: [id number]    DOB: 10/26/1955   Date:01/22/2020       Progress Note  Subjective:    Chief Complaint  Chief Complaint  Patient presents with  . Cough    pt thinks it is bronchitis, chest tightness, insomnia, cough    I connected with  [name]  on 01/22/20 at 10:00 AM EDT by a video enabled telemedicine application and verified that I am speaking with the correct person using two identifiers.  I discussed the limitations of evaluation and management by telemedicine and the availability of in person appointments. The patient expressed understanding and agreed to proceed. Staff also discussed with the patient that there may be a patient responsible charge related to this service. Patient Location: house in [place] Provider Location: [facility] clinic Additional Individuals present: nond  HPI Pt with hx of recurrent bronchitis history of pneumonia also pulmonary hypertension, PE, CHF.  Presents for worsening cough congestion chest tightness and wheeze she states that this is still consistent with her normal exacerbations which tend to occur at least 3-4 times a year, usually worse in winter as previously noted with our last office visit for the same symptoms in January this year, she notes today that she will also have 1 or 2 more times having worsening breathing symptoms when weather changes from winter to spring.  She did start Symbicort daily and says that this is helped her breathing quite a lot.  She has not previously been evaluated by pulmonology, had PFTs or been on a maintenance inhaler per her report.  She does ask why she gets this so often, she does not have a smoking history.  Patient is newer to me I have seen her a few times in the past 6 months after her PCP left the clinic some of these visits have been virtual the patient does stay in [place] most of the year.   In January when she had acute exacerbation of COPD/acute bronchitis she was given a  Z-Pak a longer steroid taper some Tessalon Perles and she was instructed to use over-the-counter cough medicines and Mucinex.  I did start her on Symbicort at that time and she notes that she recovered quickly and I examined her shortly after that for her physical and her lungs were clear.  Patient denies any smoking history, though she grew up around secondhand smoke exposure.  She has not been around anyone sick.  She does note that the weather in [place] has been very windy.  She denies any fever sweats chills but she is slightly fatigued.  She is not having any near syncope, palpitations, chest pain, lower extremity edema. She is using her rescue inhaler -helps a little bit for short amount of time She does not have a nebulizer machine.  Is also tried [employer] that she had left over from January and they are also helping a little bit  Patient Active Problem List   Diagnosis Date Noted  . Stress incontinence 12/27/2019  . Hyperlipidemia 12/27/2019  . Muscle spasms of lower extremity 12/27/2019  . Postmenopausal estrogen deficiency 12/27/2019  . Preventative health care 08/08/2018  . Class 3 severe obesity with body mass index (BMI) of 40.0 to 44.9 in adult ([place]) 07/12/2017  . Allergic rhinitis 06/26/2017  . Hematuria 07/27/2016  . Medication monitoring encounter 06/08/2016  . Degenerative joint disease (DJD) of hip 03/31/2016  . Hx of breast cancer 03/22/2016  . GERD (gastroesophageal reflux disease)  10/07/2015  . HTN (hypertension) 10/07/2015  . Depression, major, recurrent, moderate ([place]) 06/11/2015  . Anemia 05/15/2015  . Do not resuscitate status 05/15/2015  . Chronic systolic CHF (congestive heart failure) ([place]) 05/05/2015  . Left leg DVT ([place]) 05/05/2015  . Pulmonary hypertension ([place])   . Saddle pulmonary embolus ([place])   . LBBB (left bundle branch block)-rate related  04/19/2015  . Chronic migraine without aura, with intractable migraine, so stated, with status  migrainosus 11/04/2014  . Subscapularis (muscle) sprain 11/22/2013    Social History   Tobacco Use  . Smoking status: Never Smoker  . Smokeless tobacco: Never Used  Substance Use Topics  . Alcohol use: Yes    Comment: occasional wine     Current Outpatient Medications:  .  albuterol (VENTOLIN HFA) 108 (90 Base) MCG/ACT inhaler, Inhale 2 puffs into the lungs every 6 (six) hours as needed for wheezing or shortness of breath., Disp: 18 g, Rfl: 2 .  budesonide-formoterol (SYMBICORT) 160-4.5 MCG/ACT inhaler, Inhale 2 puffs into the lungs 2 (two) times daily. Rinse out mouth and spit after each use, Disp: 1 Inhaler, Rfl: 3 .  carvedilol (COREG) 6.25 MG tablet, Take 1 tablet (6.25 mg total) by mouth 2 (two) times daily., Disp: 180 tablet, Rfl: 3 .  cetirizine (ZYRTEC) 10 MG tablet, Take 10 mg by mouth daily., Disp: , Rfl:  .  fluticasone (FLONASE) 50 MCG/ACT nasal spray, INSTILL 1 SPRAY INTO EACH NOSTRIL EVERY DAY AS NEEDED, Disp: 48 mL, Rfl: 3 .  losartan (COZAAR) 25 MG tablet, Take 1 tablet (25 mg total) by mouth daily., Disp: 90 tablet, Rfl: 3 .  spironolactone (ALDACTONE) 25 MG tablet, Take 1 tablet (25 mg total) by mouth daily. TAKE 1 TABLET BY MOUTH DAILY ON MONDAY,WEDNESDAY AND FRIDAYS., Disp: 45 tablet, Rfl: 3 .  tiZANidine (ZANAFLEX) 2 MG tablet, Take 1 tablet (2 mg total) by mouth every 6 (six) hours as needed for muscle spasms., Disp: 30 tablet, Rfl: 2 .  vitamin B-12 (CYANOCOBALAMIN) 500 MCG tablet, Take 2,500 mcg by mouth daily., Disp: , Rfl:  .  vitamin C (ASCORBIC ACID) 500 MG tablet, Take 500 mg by mouth daily., Disp: , Rfl:  .  XARELTO 20 MG TABS tablet, TAKE 1 TABLET BY MOUTH EVERY DAY, Disp: 90 tablet, Rfl: 1  Allergies  Allergen Reactions  . Gadolinium Derivatives Shortness Of Breath and Nausea And Vomiting  . Levaquin [Levofloxacin In D5w] Itching    Was giving with vancomycin at same time - unsure which one pt had a reaction to  . Sulfa Antibiotics Swelling    "Ears  swelled up like Dumbo"  . Vancomycin Itching    Was giving at same time as Levaquin - unsure which pt had reaction to  . Aripiprazole     Dry mouth with sores   . Codeine Itching and Rash  . Oxycodone Itching  . Penicillins Hives    Has patient had a PCN reaction causing immediate rash, facial/tongue/throat swelling, SOB or lightheadedness with hypotension: Yes Has patient had a PCN reaction causing severe rash involving mucus membranes or skin necrosis: No Has patient had a PCN reaction that required hospitalization No Has patient had a PCN reaction occurring within the last 10 years: No If all of the above answers are "NO", then may proceed with Cephalosporin use.    I personally reviewed active problem list, medication list, allergies, family history, social history, health maintenance, notes from last encounter, lab results, imaging with the patient/caregiver today.  Review of Systems  10 Systems reviewed and are negative for acute change except as noted in the HPI.   Objective:   Virtual encounter, vitals limited, only able to obtain the following Today's Vitals   01/22/20 1004  BP: 120/70  Pulse: 86  SpO2: 94%  Weight: 251 lb (113.9 kg)  Height: 5\' 5"  (1.651 m)   Body mass index is 41.77 kg/m. Nursing Note and Vital Signs reviewed.  Physical Exam Vitals and nursing note reviewed.  Pulmonary:     Comments: No audible respiratory distress, audible wheeze or stridor, intermittent coughing during the encounter Neurological:     Mental Status: She is alert.     PE limited by telephone encounter  No results found for this or any previous visit (from the past 72 hour(s)).  Assessment and Plan:     ICD-10-CM   1. Chronic obstructive pulmonary disease with acute exacerbation (HCC)  J44.1 guaiFENesin (MUCINEX) 600 MG 12 hr tablet    doxycycline (VIBRA-TABS) 100 MG tablet    predniSONE (DELTASONE) 20 MG tablet  Recurrent exacerbation, at least the second 1 so far  this year, patient is fairly new to me, reports that she has never had a pulmonology work-up before -she does seem to have chronic respiratory symptoms and I suspect chronic pulmonary disease although she would need PFTs and I believe a pulmonology evaluation would be very helpful.  She does have history of pneumonia and pulmonary hypertension, she obese and has truncal obesity and may have some aspect of obesity hypoventilation syndrome as well. I explained that I would like to refer her to pulmonology now also when she is back in town in a month or 2 she can get in and get evaluated.  She may need to reschedule this due to upcoming jury duty  She is started Symbicort this year, first exacerbation since starting the medication she does not like she is taking it daily.  He notes that it is help with her daily symptoms significantly.  Trelegy/triple therapy inhaler may be beneficial.  For now I encouraged her to treat with her Tessalon Perles, use Mucinex, we will do doxycycline this time instead of Z-Pak, and steroid taper.  Encouraged her to get tested for Covid, she does not believe she had an exposure, encouraged her to quarantine until she has results or feels much better for about a week We reviewed concerning signs and symptoms which she should seek immediate evaluation for locally and she verbalized understanding.   She does have hx of pulm htn, CHF, PE in the past - follows with cardiology - if any concerning or worsening exertional sx pt instructed to go to the ER. 2nd COPD exacerbation this year - may need to see pulm - likely has some OHS as well Advised to get tested for COVID locally and quarantine if fever, chills, sweats, body aches - would need to isolate if positive.     -Red flags and when to present for emergency care or RTC including fever >101.1F, chest pain, shortness of breath, new/worsening/un-resolving symptoms, reviewed with patient at time of visit. Follow up and care  instructions discussed and provided in AVS. - I discussed the assessment and treatment plan with the patient. The patient was provided an opportunity to ask questions and all were answered. The patient agreed with the plan and demonstrated an understanding of the instructions.  I provided 20+ minutes of non-face-to-face time during this encounter.  Delsa Grana, PA-C 01/22/20 10:36 AM

## 2020-05-10 ENCOUNTER — Other Ambulatory Visit: Payer: Self-pay | Admitting: Interventional Cardiology

## 2020-05-13 NOTE — Telephone Encounter (Signed)
Prescription refill request for Xarelto received.   Last office visit: Patricia Fischer 12/27/2019 Weight: 113.9 kg Age: 64 y.o. Scr:  0.74, 10/04/2019 CrCl: 136 ml/min   Prescription refill sent.

## 2020-06-13 ENCOUNTER — Other Ambulatory Visit: Payer: Self-pay | Admitting: Family Medicine

## 2020-06-13 DIAGNOSIS — M62838 Other muscle spasm: Secondary | ICD-10-CM

## 2020-07-07 LAB — HEMOGLOBIN A1C: Hemoglobin A1C: 5.6

## 2020-07-10 ENCOUNTER — Other Ambulatory Visit: Payer: Self-pay | Admitting: Interventional Cardiology

## 2020-07-19 ENCOUNTER — Other Ambulatory Visit: Payer: Self-pay | Admitting: Family Medicine

## 2020-07-19 DIAGNOSIS — M62838 Other muscle spasm: Secondary | ICD-10-CM

## 2020-07-29 ENCOUNTER — Encounter: Payer: Self-pay | Admitting: Family Medicine

## 2020-08-04 NOTE — Progress Notes (Deleted)
Cardiology Office Note   Date:  08/04/2020   ID:  TORIAN THOENNES, DOB November 11, 1955, MRN [id number]  PCP:  Delsa Grana, PA-C  Cardiologist:  Charlsie Quest, MD   No chief complaint on file.     History of Present Illness: Patricia Fischer is a 64 y.o. female who presents for follow up, seen for Dr. Tamala Julian.   Patricia Fischer has a hx of chronic systolic CHF with improved LVEF, non ischemic cardiomyopathy, recurrent PE on chronic anticoagulation with Rivaroxaban with secondary pulmonary hypertension, breast CA, HTN, and LBBB.   She underwent an echo in 08/2015 that showed an EF at 35-40%>>with improved LVEF per cMRI 08/21/2017 with EF at 52% and no LGE.   She was last seen in follow up 12/2019 at which time she was doing well from a CV standpoint. She would often visit [place] and do lots of walking without issues. She was noted to chronically sleep in 2 pillows without recent change.    1. Chronic systolic CHF (congestive heart failure) (HCC) Nonischemic cardiomyopathy.  NYHA II.  EF was previously 35-40% and has improved to 52% on cardiac MRI in 2018.  She is doing well.  Volume status is stable.  Continue current dose of carvedilol, losartan, spironolactone.  Recent creatinine, potassium normal.  2. History of pulmonary embolism 3. Chronic anticoagulation Recent hemoglobin, creatinine normal.  Continue long-term anticoagulation with Rivaroxaban.  4. Essential hypertension Diastolic reading is somewhat elevated today.  However, she just used her Symbicort prior to having her blood pressure checked.  She typically has blood pressure readings at home 120s/70s.  Blood pressure well controlled.  Continue current therapy.  5. LBBB (left bundle branch block)-rate related  Notes indicate that she has a history of left bundle branch block.  I reviewed several EKGs in the recent past which demonstrated a narrow QRS.  She likely has an intermittent, possibly rate related, left bundle branch  block.    Past Medical History:  Diagnosis Date  . Anxiety   . Asthma   . Blood transfusion [place]  . Breast cancer, left breast ([place]) 2011   DCIS  . Chronic systolic CHF w/ Improved LVF 05/05/2015   NICM // Dx 04/2015 - EF initially 25-30% with RV dysfunction // f/u echo 08/2015 technically difficult, EF 35-40%, anterior, anteroseptal, apical and inferoapical severe hypokinesis suggestive of LAD territory ischemia/infarct, grade 1 DD, mild MR, RV normal. // Echo 5/18: mild LVH, EF 30-35, Gr 1 DD // cMRI 12/18: EF 52   . Depression   . DVT (deep venous thrombosis) ([place]) Aug. 2016   a. Dx 04/2015 - patient returned 05/2015 after noncompliance with Xarelto.  . Environmental allergies   . Essential hypertension   . Family Hx of adverse reaction to anesthesia    " My Mother would get deathly sick "  . Gastroesophageal reflux disease   . Head injury 2011  . Hemorrhoids   . History of cardiac catheterization    Cath 10/2015: oLCx 50 (prob cath induced spasm), no sig CAD, EF 35-45  . LBBB (left bundle branch block)   . Migraine HAs 11/2014  . Obesity   . Orthostatic hypotension   . Pneumonia 2012  . Saddle pulmonary embolus ([place]) Aug. 2016   a. Dx 04/2015 - patient returned 05/2015 after noncompliance with Xarelto.  . Shortness of breath dyspnea   . Syncope    a. Recurrent syncope in 2016 felt 2/2 orthostasis in setting of PE.  Past Surgical History:  Procedure Laterality Date  . ABDOMINAL HYSTERECTOMY  1986  . ANAL FISSURE REPAIR    . ANTERIOR CERVICAL DECOMP/DISCECTOMY FUSION  01/23/2012   Procedure: ANTERIOR CERVICAL DECOMPRESSION/DISCECTOMY FUSION 1 LEVEL;  Surgeon: Elaina Hoops, MD;  Location: [facility];  Service: Neurosurgery;  Laterality: N/A;  Cervical five-six anterior cervical discectomy fusion with plating  . APPENDECTOMY    . BREAST BIOPSY Left 06/2016   US biopsy-PAPILLARY CYSTIC APOCRINE CHANGE   . BREAST SURGERY Left 2011   Breast lumpectomy  . CARDIAC  CATHETERIZATION  2010   [facility]; pt states it was "clear"  . CARDIAC CATHETERIZATION N/A 10/28/2015   Procedure: Right/Left Heart Cath and Coronary Angiography;  Surgeon: Belva Crome, MD;  Location: [facility];  Service: Cardiovascular;  Laterality: N/A;  . CARPAL TUNNEL RELEASE Bilateral   . CESAREAN SECTION  1982, 1979  . CHOLECYSTECTOMY    . COLONOSCOPY  01/16/2009   Dr Bary Castilla  . KNEE CARTILAGE SURGERY Left   . SHOULDER ARTHROSCOPY WITH SUBACROMIAL DECOMPRESSION, ROTATOR CUFF REPAIR AND BICEP TENDON REPAIR Right 11/22/2013   Procedure: RIGHT SHOULDER ATHROSCOPY OPEN SUBSCAP REPAIR DELTA-PECTORAL ;  Surgeon: Augustin Schooling, MD;  Location: [facility];  Service: Orthopedics;  Laterality: Right;  . TARSAL TUNNEL RELEASE Left 1990  . TONSILLECTOMY    . TUBAL LIGATION       Current Outpatient Medications  Medication Sig Dispense Refill  . albuterol (VENTOLIN HFA) 108 (90 Base) MCG/ACT inhaler Inhale 2 puffs into the lungs every 6 (six) hours as needed for wheezing or shortness of breath. 18 g 2  . budesonide-formoterol (SYMBICORT) 160-4.5 MCG/ACT inhaler Inhale 2 puffs into the lungs 2 (two) times daily. Rinse out mouth and spit after each use 1 Inhaler 3  . carvedilol (COREG) 6.25 MG tablet TAKE 1 TABLET BY MOUTH TWICE A DAY 180 tablet 2  . cetirizine (ZYRTEC) 10 MG tablet Take 10 mg by mouth daily.    . fluticasone (FLONASE) 50 MCG/ACT nasal spray INSTILL 1 SPRAY INTO EACH NOSTRIL EVERY DAY AS NEEDED 48 mL 3  . losartan (COZAAR) 25 MG tablet TAKE 1 TABLET BY MOUTH EVERY DAY 90 tablet 0  . predniSONE (DELTASONE) 20 MG tablet 3 tabs poqday 1-3, 2 tabs poqday 4-6, 1 tab poqday 7-9 18 tablet 0  . spironolactone (ALDACTONE) 25 MG tablet TAKE 1 TABLET BY MOUTH EVERY DAY ON MONDAY,WEDNESDAY AND FRIDAYS 36 tablet 0  . tiZANidine (ZANAFLEX) 2 MG tablet TAKE 1 TABLET BY MOUTH EVERY 8 HOURS AS NEEDED 90 tablet 0  . vitamin B-12 (CYANOCOBALAMIN) 500 MCG tablet Take 2,500 mcg by mouth  daily.    . vitamin C (ASCORBIC ACID) 500 MG tablet Take 500 mg by mouth daily.    Alveda Reasons 20 MG TABS tablet TAKE 1 TABLET BY MOUTH EVERY DAY 90 tablet 1   No current facility-administered medications for this visit.    Allergies:   Gadolinium derivatives, Levaquin [levofloxacin in d5w], Sulfa antibiotics, Vancomycin, Aripiprazole, Codeine, Oxycodone, and Penicillins    Social History:  The patient  reports that she has never smoked. She has never used smokeless tobacco. She reports current alcohol use. She reports that she does not use drugs.   Family History:  The patient's ***family history includes Aneurysm in her paternal grandfather; Breast cancer in her cousin, cousin, cousin, and paternal aunt; Breast cancer (age of onset: 71) in her mother; COPD in her father and paternal grandfather; Cancer in her  father and mother; Heart attack in her paternal grandmother; Heart disease in her paternal grandmother; Hypertension in her mother; Migraines in her mother; Rectal cancer in her father; Stroke in her paternal grandmother.    ROS:  Please see the history of present illness.   Otherwise, review of systems are positive for {NONE DEFAULTED:18576::"none"}.   All other systems are reviewed and negative.    PHYSICAL EXAM: VS:  There were no vitals taken for this visit. , BMI There is no height or weight on file to calculate BMI. GEN: Well nourished, well developed, in no acute distress HEENT: normal Neck: no JVD, carotid bruits, or masses Cardiac: ***RRR; no murmurs, rubs, or gallops,no edema  Respiratory:  clear to auscultation bilaterally, normal work of breathing GI: soft, nontender, nondistended, + BS MS: no deformity or atrophy Skin: warm and dry, no rash Neuro:  Strength and sensation are intact Psych: euthymic mood, full affect   EKG:  EKG {ACTION; IS/IS XTA:56979480} ordered today. The ekg ordered today demonstrates ***   Recent Labs: 09/30/2019: Hemoglobin 13.1; Platelets  185 10/04/2019: ALT 18; BUN 11; Creat 0.74; Potassium 4.2; Sodium 141; TSH 0.82    Lipid Panel    Component Value Date/Time   CHOL 193 09/30/2019 0810   TRIG 152 (H) 09/30/2019 0810   HDL 53 09/30/2019 0810   CHOLHDL 3.6 09/30/2019 0810   CHOLHDL 3.9 08/08/2018 0954   VLDL 18 05/17/2016 1454   LDLCALC 113 (H) 09/30/2019 0810   LDLCALC 104 (H) 08/08/2018 0954      Wt Readings from Last 3 Encounters:  01/22/20 251 lb (113.9 kg)  12/27/19 251 lb 6.4 oz (114 kg)  12/27/19 252 lb (114.3 kg)      Other studies Reviewed: Additional studies/ records that were reviewed today include: ***. Review of the above records demonstrates: ***  Cardiac MRI 08/21/17 EF 52; no LGE  Echocardiogram 01/09/17 Mild LVH, EF 30-35, diff HK, Gr 1 DD, trace MR/TR  Cardiac catheterization 10/28/15 LCx ost 50 (prob cath spasm) EF 35-45  Carotid US 10/07/15 No sig ICA stenosis  Echocardiogram 08/25/15 EF 35-40, ant/ant-sept/apical/inf-apical HK, Gr 1 DD, mild MR, RA/LA upper limits of NL  ASSESSMENT AND PLAN:  1.  ***   Current medicines are reviewed at length with the patient today.  The patient {ACTIONS; HAS/DOES NOT HAVE:19233} concerns regarding medicines.  The following changes have been made:  {PLAN; NO CHANGE:13088:s}  Labs/ tests ordered today include: *** No orders of the defined types were placed in this encounter.    Disposition:   FU with *** in {gen number 1-65:537482} {Days to years:10300}  Signed, Kathyrn Drown, NP  08/04/2020 10:36 AM    [facility] [address] [place], [place], [place]  [address] Phone: [phone]; Fax: [phone]

## 2020-08-09 NOTE — Progress Notes (Unsigned)
Virtual Visit via Telephone Note   This visit type was conducted due to national recommendations for restrictions regarding the COVID-19 Pandemic (e.g. social distancing) in an effort to limit this patient's exposure and mitigate transmission in our community.  Due to her co-morbid illnesses, this patient is at least at moderate risk for complications without adequate follow up.  This format is felt to be most appropriate for this patient at this time.  The patient did not have access to video technology/had technical difficulties with video requiring transitioning to audio format only (telephone).  All issues noted in this document were discussed and addressed.  No physical exam could be performed with this format.  Please refer to the patient's chart for her  consent to telehealth for [facility].    Date:  08/10/2020   ID:  Patricia Fischer, DOB 1956-07-08, MRN [id number] The patient was identified using 2 identifiers.  Patient Location: Home Provider Location: Home Office  PCP:  Delsa Grana, PA-C  Cardiologist:  Sinclair Grooms, MD  Electrophysiologist:  None   Evaluation Performed:  Follow-Up Visit  Chief Complaint:  Follow up   History of Present Illness:    Patricia Fischer is a 64 y.o. female with a hx of chronic systolic CHF with improved LVEF, non ischemic cardiomyopathy, recurrent PE on chronic anticoagulation with Rivaroxaban with secondary pulmonary hypertension, breast CA, HTN, and LBBB.   She underwent an echo in 08/2015 that showed an EF at 35-40%, follow up echo 01/2017 with EF at 30-35%. She underwent cMRI 08/21/2017 that showed an EF at 52% and no LGE.   She was last seen in follow up 12/2019 with Richardson Dopp, PA-C at which time she was doing well from a CV standpoint. She would often visit [place] and was walking [place] on the beach without issues.   Today she is see in telehealth visit and she states that she is doing very well from a CV standpoint. She has  been having issues with right leg pain that originates in her lower back and shoots down her leg. She discussed this with her health insurance NP recently who felt this is related to sciatic pain. She continues to walk frequently however this is somewhat limited by recent pain issues. She denies chest pain, palpitations, LE edema, orthopnea, SOB, PND or syncope. Overall she is very well. She is compliant with her medications. Recent HbA1c per NP was 5.6.    The patient does not have symptoms concerning for COVID-19 infection (fever, chills, cough, or new shortness of breath).    Past Medical History:  Diagnosis Date  . Anxiety   . Asthma   . Blood transfusion [place]  . Breast cancer, left breast ([place]) 2011   DCIS  . Chronic systolic CHF w/ Improved LVF 05/05/2015   NICM // Dx 04/2015 - EF initially 25-30% with RV dysfunction // f/u echo 08/2015 technically difficult, EF 35-40%, anterior, anteroseptal, apical and inferoapical severe hypokinesis suggestive of LAD territory ischemia/infarct, grade 1 DD, mild MR, RV normal. // Echo 5/18: mild LVH, EF 30-35, Gr 1 DD // cMRI 12/18: EF 52   . Depression   . DVT (deep venous thrombosis) ([place]) Aug. 2016   a. Dx 04/2015 - patient returned 05/2015 after noncompliance with Xarelto.  . Environmental allergies   . Essential hypertension   . Family Hx of adverse reaction to anesthesia    " My Mother would get deathly sick "  . Gastroesophageal  reflux disease   . Head injury 2011  . Hemorrhoids   . History of cardiac catheterization    Cath 10/2015: oLCx 50 (prob cath induced spasm), no sig CAD, EF 35-45  . LBBB (left bundle branch block)   . Migraine HAs 11/2014  . Obesity   . Orthostatic hypotension   . Pneumonia 2012  . Saddle pulmonary embolus ([place]) Aug. 2016   a. Dx 04/2015 - patient returned 05/2015 after noncompliance with Xarelto.  . Shortness of breath dyspnea   . Syncope    a. Recurrent syncope in 2016 felt 2/2 orthostasis in  setting of PE.   Past Surgical History:  Procedure Laterality Date  . ABDOMINAL HYSTERECTOMY  1986  . ANAL FISSURE REPAIR    . ANTERIOR CERVICAL DECOMP/DISCECTOMY FUSION  01/23/2012   Procedure: ANTERIOR CERVICAL DECOMPRESSION/DISCECTOMY FUSION 1 LEVEL;  Surgeon: Elaina Hoops, MD;  Location: [place] [facility]UR[facility] ORS;  Service: Neurosurgery;  Laterality: N/A;  Cervical five-six anterior cervical discectomy fusion with plating  . APPENDECTOMY    . BREAST BIOPSY Left 06/2016   US biopsy-PAPILLARY CYSTIC APOCRINE CHANGE   . BREAST SURGERY Left 2011   Breast lumpectomy  . CARDIAC CATHETERIZATION  2010   [facility]; pt states it was "clear"  . CARDIAC CATHETERIZATION N/A 10/28/2015   Procedure: Right/Left Heart Cath and Coronary Angiography;  Surgeon: Belva Crome, MD;  Location: [facility] CV LAB;  Service: Cardiovascular;  Laterality: N/A;  . CARPAL TUNNEL RELEASE Bilateral   . CESAREAN SECTION  1982, 1979  . CHOLECYSTECTOMY    . COLONOSCOPY  01/16/2009   Dr Bary Castilla  . KNEE CARTILAGE SURGERY Left   . SHOULDER ARTHROSCOPY WITH SUBACROMIAL DECOMPRESSION, ROTATOR CUFF REPAIR AND BICEP TENDON REPAIR Right 11/22/2013   Procedure: RIGHT SHOULDER ATHROSCOPY OPEN SUBSCAP REPAIR DELTA-PECTORAL ;  Surgeon: Augustin Schooling, MD;  Location: [facility];  Service: Orthopedics;  Laterality: Right;  . TARSAL TUNNEL RELEASE Left 1990  . TONSILLECTOMY    . TUBAL LIGATION       Current Meds  Medication Sig  . albuterol (VENTOLIN HFA) 108 (90 Base) MCG/ACT inhaler Inhale 2 puffs into the lungs every 6 (six) hours as needed for wheezing or shortness of breath.  . budesonide-formoterol (SYMBICORT) 160-4.5 MCG/ACT inhaler Inhale 2 puffs into the lungs 2 (two) times daily. Rinse out mouth and spit after each use  . carvedilol (COREG) 6.25 MG tablet Take 1 tablet (6.25 mg total) by mouth 2 (two) times daily.  . cetirizine (ZYRTEC) 10 MG tablet Take 10 mg by mouth daily.  . fluticasone (FLONASE) 50 MCG/ACT nasal spray  INSTILL 1 SPRAY INTO EACH NOSTRIL EVERY DAY AS NEEDED  . losartan (COZAAR) 25 MG tablet Take 1 tablet (25 mg total) by mouth daily.  . predniSONE (DELTASONE) 20 MG tablet 3 tabs poqday 1-3, 2 tabs poqday 4-6, 1 tab poqday 7-9  . rivaroxaban (XARELTO) 20 MG TABS tablet Take 1 tablet (20 mg total) by mouth daily.  Marland Kitchen spironolactone (ALDACTONE) 25 MG tablet TAKE 1 TABLET BY MOUTH EVERY DAY ON MONDAY,WEDNESDAY AND FRIDAYS  . tiZANidine (ZANAFLEX) 2 MG tablet TAKE 1 TABLET BY MOUTH EVERY 8 HOURS AS NEEDED  . vitamin B-12 (CYANOCOBALAMIN) 500 MCG tablet Take 2,500 mcg by mouth daily.  . vitamin C (ASCORBIC ACID) 500 MG tablet Take 500 mg by mouth daily.  . [DISCONTINUED] carvedilol (COREG) 6.25 MG tablet TAKE 1 TABLET BY MOUTH TWICE A DAY  . [DISCONTINUED] losartan (COZAAR) 25 MG tablet TAKE 1  TABLET BY MOUTH EVERY DAY  . [DISCONTINUED] spironolactone (ALDACTONE) 25 MG tablet TAKE 1 TABLET BY MOUTH EVERY DAY ON MONDAY,WEDNESDAY AND FRIDAYS  . [DISCONTINUED] XARELTO 20 MG TABS tablet TAKE 1 TABLET BY MOUTH EVERY DAY     Allergies:   Gadolinium derivatives, Levaquin [levofloxacin in d5w], Sulfa antibiotics, Vancomycin, Aripiprazole, Codeine, Oxycodone, and Penicillins   Social History   Tobacco Use  . Smoking status: Never Smoker  . Smokeless tobacco: Never Used  Vaping Use  . Vaping Use: Never used  Substance Use Topics  . Alcohol use: Yes    Comment: occasional wine  . Drug use: No     Family Hx: The patient's family history includes Aneurysm in her paternal grandfather; Breast cancer in her cousin, cousin, cousin, and paternal aunt; Breast cancer (age of onset: 23) in her mother; COPD in her father and paternal grandfather; Cancer in her father and mother; Heart attack in her paternal grandmother; Heart disease in her paternal grandmother; Hypertension in her mother; Migraines in her mother; Rectal cancer in her father; Stroke in her paternal grandmother. There is no history of Anesthesia  problems or Diabetes.  ROS:   Please see the history of present illness.     All other systems reviewed and are negative.  Prior CV studies:   The following studies were reviewed today:  Cardiac MRI 08/21/17 EF 52; no LGE  Echocardiogram 01/09/17 Mild LVH, EF 30-35, diff HK, Gr 1 DD, trace MR/TR  Cardiac catheterization 10/28/15 LCx ost 50 (prob cath spasm) EF 35-45  Carotid US 10/07/15 No sig ICA stenosis  Echocardiogram 08/25/15 EF 35-40, ant/ant-sept/apical/inf-apical HK, Gr 1 DD, mild MR, RA/LA upper limits of NL  Labs/Other Tests and Data Reviewed:    EKG:  No ECG reviewed.  Recent Labs: 09/30/2019: Hemoglobin 13.1; Platelets 185 10/04/2019: ALT 18; BUN 11; Creat 0.74; Potassium 4.2; Sodium 141; TSH 0.82   Recent Lipid Panel Lab Results  Component Value Date/Time   CHOL 193 09/30/2019 08:10 AM   TRIG 152 (H) 09/30/2019 08:10 AM   HDL 53 09/30/2019 08:10 AM   CHOLHDL 3.6 09/30/2019 08:10 AM   CHOLHDL 3.9 08/08/2018 09:54 AM   LDLCALC 113 (H) 09/30/2019 08:10 AM   LDLCALC 104 (H) 08/08/2018 09:54 AM    Wt Readings from Last 3 Encounters:  08/10/20 250 lb (113.4 kg)  01/22/20 251 lb (113.9 kg)  12/27/19 251 lb 6.4 oz (114 kg)     Objective:    Vital Signs:  BP 118/77   Pulse 77   Ht 5\' 4"  (1.626 m)   Wt 250 lb (113.4 kg)   SpO2 96%   BMI 42.91 kg/m    VITAL SIGNS:  reviewed GEN:  no acute distress NEURO:  alert and oriented x 3, no obvious focal deficit PSYCH:  normal affect  ASSESSMENT & PLAN:    1. Chronic systolic CHF/nonischemic cardiomyopathy: -Last echo 01/2017 with EF at 35% with cMRI follow up 08/2017 with EF at 52% -No HF symptoms on verbal assessment -Would plan to repeat echo prior to next OV with Dr. Tamala Julian  -No volume issues -Continue carvedilol, losartan, spironolactone. -Last labs with normal creatinine and K+ -Orders placed for labs prior to next OV  2. History of pulmonary embolism: -Continue with chronic AC with Xarelto   -No bleeding concerns today   3. Essential hypertension: -Very well controlled, continue current regimen -Plan labs at next OV -Currently the patient is in [place]   4. LBBB: -No EKG performed  today   Shared Decision Making/Informed Consent    COVID-19 Education: The signs and symptoms of COVID-19 were discussed with the patient and how to seek care for testing (follow up with PCP or arrange E-visit).  The importance of social distancing was discussed today.  Time:   Today, I have spent 15 minutes with the patient with telehealth technology discussing the above problems.     Medication Adjustments/Labs and Tests Ordered: Current medicines are reviewed at length with the patient today.  Concerns regarding medicines are outlined above.   Tests Ordered: Orders Placed This Encounter  Procedures  . Comprehensive metabolic panel  . CBC  . Lipid panel  . ECHOCARDIOGRAM COMPLETE    Medication Changes: Meds ordered this encounter  Medications  . carvedilol (COREG) 6.25 MG tablet    Sig: Take 1 tablet (6.25 mg total) by mouth 2 (two) times daily.    Dispense:  180 tablet    Refill:  3  . losartan (COZAAR) 25 MG tablet    Sig: Take 1 tablet (25 mg total) by mouth daily.    Dispense:  90 tablet    Refill:  3  . spironolactone (ALDACTONE) 25 MG tablet    Sig: TAKE 1 TABLET BY MOUTH EVERY DAY ON MONDAY,WEDNESDAY AND FRIDAYS    Dispense:  36 tablet    Refill:  3  . rivaroxaban (XARELTO) 20 MG TABS tablet    Sig: Take 1 tablet (20 mg total) by mouth daily.    Dispense:  90 tablet    Refill:  3    Follow Up:  In Person Dr. Faythe Casa in 5-6 months   Signed, Kathyrn Drown, NP  08/10/2020 11:55 AM    [place]

## 2020-08-10 ENCOUNTER — Encounter: Payer: Self-pay | Admitting: Cardiology

## 2020-08-10 ENCOUNTER — Other Ambulatory Visit: Payer: Self-pay

## 2020-08-10 ENCOUNTER — Ambulatory Visit: Payer: Medicare Other | Admitting: Cardiology

## 2020-08-10 ENCOUNTER — Telehealth (INDEPENDENT_AMBULATORY_CARE_PROVIDER_SITE_OTHER): Payer: Medicare Other | Admitting: Cardiology

## 2020-08-10 VITALS — BP 118/77 | HR 77 | Ht 64.0 in | Wt 250.0 lb

## 2020-08-10 DIAGNOSIS — I420 Dilated cardiomyopathy: Secondary | ICD-10-CM

## 2020-08-10 DIAGNOSIS — Z86711 Personal history of pulmonary embolism: Secondary | ICD-10-CM

## 2020-08-10 DIAGNOSIS — Z7901 Long term (current) use of anticoagulants: Secondary | ICD-10-CM | POA: Diagnosis not present

## 2020-08-10 DIAGNOSIS — I5022 Chronic systolic (congestive) heart failure: Secondary | ICD-10-CM | POA: Diagnosis not present

## 2020-08-10 DIAGNOSIS — I447 Left bundle-branch block, unspecified: Secondary | ICD-10-CM

## 2020-08-10 DIAGNOSIS — I10 Essential (primary) hypertension: Secondary | ICD-10-CM | POA: Diagnosis not present

## 2020-08-10 MED ORDER — RIVAROXABAN 20 MG PO TABS
20.0000 mg | ORAL_TABLET | Freq: Every day | ORAL | 3 refills | Status: DC
Start: 2020-08-10 — End: 2020-10-29

## 2020-08-10 MED ORDER — SPIRONOLACTONE 25 MG PO TABS
ORAL_TABLET | ORAL | 3 refills | Status: DC
Start: 2020-08-10 — End: 2021-02-12

## 2020-08-10 MED ORDER — LOSARTAN POTASSIUM 25 MG PO TABS: 25.0000 mg | ORAL_TABLET | Freq: Every day | ORAL | 3 refills | Status: DC

## 2020-08-10 MED ORDER — CARVEDILOL 6.25 MG PO TABS: 6.2500 mg | ORAL_TABLET | Freq: Two times a day (BID) | ORAL | 3 refills | Status: DC

## 2020-08-10 NOTE — Patient Instructions (Signed)
Medication Instructions:  Your physician recommends that you continue on your current medications as directed. Please refer to the Current Medication list given to you today.  *If you need a refill on your cardiac medications before your next appointment, please call your pharmacy*   Lab Work: TO BE DONE PRIOR TO 5-6 MONTH APPOINTMENT: CMET, CBC, LIPIDS If you have labs (blood work) drawn today and your tests are completely normal, you will receive your results only by: Marland Kitchen MyChart Message (if you have MyChart) OR . A paper copy in the mail If you have any lab test that is abnormal or we need to change your treatment, we will call you to review the results.   Testing/Procedures: Your physician has requested that you have an echocardiogram. Echocardiography is a painless test that uses sound waves to create images of your heart. It provides your doctor with information about the size and shape of your heart and how well your heart's chambers and valves are working. This procedure takes approximately one hour. There are no restrictions for this procedure.     Follow-Up: At [facility], you and your health needs are our priority.  As part of our continuing mission to provide you with exceptional heart care, we have created designated Provider Care Teams.  These Care Teams include your primary Cardiologist (physician) and Advanced Practice Providers (APPs -  Physician Assistants and Nurse Practitioners) who all work together to provide you with the care you need, when you need it.  We recommend signing up for the patient portal called "MyChart".  Sign up information is provided on this After Visit Summary.  MyChart is used to connect with patients for Virtual Visits (Telemedicine).  Patients are able to view lab/test results, encounter notes, upcoming appointments, etc.  Non-urgent messages can be sent to your provider as well.   To learn more about what you can do with MyChart, go to  [email or web address].    Your next appointment:   5-6 month(s)  The format for your next appointment:   In Person  Provider:   You may see Sinclair Grooms, MD or one of the following Advanced Practice Providers on your designated Care Team:    Truitt Merle, NP  Cecilie Kicks, NP  Kathyrn Drown, NP    Other Instructions  Echocardiogram An echocardiogram is a procedure that uses painless sound waves (ultrasound) to produce an image of the heart. Images from an echocardiogram can provide important information about:  Signs of coronary artery disease (CAD).  Aneurysm detection. An aneurysm is a weak or damaged part of an artery wall that bulges out from the normal force of blood pumping through the body.  Heart size and shape. Changes in the size or shape of the heart can be associated with certain conditions, including heart failure, aneurysm, and CAD.  Heart muscle function.  Heart valve function.  Signs of a past heart attack.  Fluid buildup around the heart.  Thickening of the heart muscle.  A tumor or infectious growth around the heart valves. Tell a health care provider about:  Any allergies you have.  All medicines you are taking, including vitamins, herbs, eye drops, creams, and over-the-counter medicines.  Any blood disorders you have.  Any surgeries you have had.  Any medical conditions you have.  Whether you are pregnant or may be pregnant. What are the risks? Generally, this is a safe procedure. However, problems may occur, including:  Allergic reaction to dye (contrast) that may be  used during the procedure. What happens before the procedure? No specific preparation is needed. You may eat and drink normally. What happens during the procedure?   An IV tube may be inserted into one of your veins.  You may receive contrast through this tube. A contrast is an injection that improves the quality of the pictures from your heart.  A gel will  be applied to your chest.  A wand-like tool (transducer) will be moved over your chest. The gel will help to transmit the sound waves from the transducer.  The sound waves will harmlessly bounce off of your heart to allow the heart images to be captured in real-time motion. The images will be recorded on a computer. The procedure may vary among health care providers and hospitals. What happens after the procedure?  You may return to your normal, everyday life, including diet, activities, and medicines, unless your health care provider tells you not to do that. Summary  An echocardiogram is a procedure that uses painless sound waves (ultrasound) to produce an image of the heart.  Images from an echocardiogram can provide important information about the size and shape of your heart, heart muscle function, heart valve function, and fluid buildup around your heart.  You do not need to do anything to prepare before this procedure. You may eat and drink normally.  After the echocardiogram is completed, you may return to your normal, everyday life, unless your health care provider tells you not to do that. This information is not intended to replace advice given to you by your health care provider. Make sure you discuss any questions you have with your health care provider. Document Revised: 12/13/2018 Document Reviewed: 09/24/2016 Elsevier Patient Education  [place].

## 2020-08-23 ENCOUNTER — Other Ambulatory Visit: Payer: Self-pay | Admitting: Family Medicine

## 2020-08-23 DIAGNOSIS — M62838 Other muscle spasm: Secondary | ICD-10-CM

## 2020-08-25 ENCOUNTER — Telehealth: Payer: Self-pay | Admitting: Family Medicine

## 2020-08-25 NOTE — Telephone Encounter (Signed)
Copied from [place] [id number]. Topic: Medicare AWV >> Aug 25, 2020  1:16 PM Cher Nakai R wrote: Reason for CRM:  Left message for patient to call back and schedule Medicare Annual Wellness Visit (AWV) in office.   If not able to come in office, please offer to do virtually.   No hx of AWV eligible as of 09/05/2009  Please schedule at anytime with [place].      40 Minutes appointment   Any questions, please call me at [phone]

## 2020-09-15 ENCOUNTER — Telehealth: Payer: Self-pay | Admitting: Family Medicine

## 2020-09-15 NOTE — Telephone Encounter (Signed)
Copied from [place] [id number]. Topic: Medicare AWV >> Sep 15, 2020  9:53 AM Cher Nakai R wrote: Reason for CRM:   Left message for patient to call back and schedule Medicare Annual Wellness Visit (AWV) in office.   If not able to come in office, please offer to do virtually.   No hx of AWV eligible as of  09/05/2009 for awvi  Please schedule at anytime with [facility].      40 Minutes appointment   Any questions, please call me at [phone]

## 2020-10-17 ENCOUNTER — Other Ambulatory Visit: Payer: Self-pay | Admitting: Interventional Cardiology

## 2020-10-18 ENCOUNTER — Other Ambulatory Visit: Payer: Self-pay | Admitting: Interventional Cardiology

## 2020-10-28 ENCOUNTER — Other Ambulatory Visit: Payer: Self-pay | Admitting: Interventional Cardiology

## 2020-10-28 ENCOUNTER — Other Ambulatory Visit: Payer: Self-pay | Admitting: Family Medicine

## 2020-10-28 ENCOUNTER — Telehealth: Payer: Self-pay | Admitting: Interventional Cardiology

## 2020-10-28 DIAGNOSIS — M62838 Other muscle spasm: Secondary | ICD-10-CM

## 2020-10-28 MED ORDER — LOSARTAN POTASSIUM 25 MG PO TABS: 25.0000 mg | ORAL_TABLET | Freq: Every day | ORAL | 3 refills | Status: DC

## 2020-10-28 NOTE — Telephone Encounter (Signed)
Attempted to call pt

## 2020-10-28 NOTE — Addendum Note (Signed)
Addended by: Carter Kitten D on: 10/28/2020 11:51 AM   Modules accepted: Orders

## 2020-10-28 NOTE — Telephone Encounter (Signed)
Prescription refill request for Xarelto received.   Hx: DVT Last office visit:08/10/2020, Mcdaniel Weight: 113.4 kg Age: 65 Scr: 0.74, 10/04/2019 CrCl: 180ml/min   Pt is overdue for blood work.

## 2020-10-28 NOTE — Telephone Encounter (Signed)
Pt has an appt scheduled with Kristeen Miss for June. She is currently in [place] and will not be back until then. Her last visit was virtual (May 2021) and she was in office for a physical for 4.2021.  She is asking for a virtual visit but per Elenor Quinones license only cover [place] virtual visits. Pt is completely out and want to know what can she due until then. Please advise

## 2020-10-28 NOTE — Telephone Encounter (Signed)
*  STAT* If patient is at the pharmacy, call can be transferred to refill team.   1. Which medications need to be refilled? (please list name of each medication and dose if known) losartan (COZAAR) 25 MG tablet  2. Which pharmacy/location (including street and city if local pharmacy) is medication to be sent to? [id number] [place], [place] - [address]  3. Do they need a 30 day or 90 day supply? 90 day supply    Pt states pharmacy advised her the refill has not been received and our office has it on hold. Please advise.

## 2020-10-28 NOTE — Telephone Encounter (Signed)
Medication Refill - Medication: tiZANidine (ZANAFLEX) 2 MG tablet  Pt will not have enough to last her until her scheduled appt time in June. She is out of state until then.   Has the patient contacted their pharmacy? Yes.   (Agent: If no, request that the patient contact the pharmacy for the refill.) (Agent: If yes, when and what did the pharmacy advise?)  Preferred Pharmacy (with phone number or street name):  [id number] [place], [address]  [place] [facility] [address]  Phone: [phone] Fax: [phone]     Agent: Please be advised that RX refills may take up to 3 business days. We ask that you follow-up with your pharmacy.

## 2020-10-28 NOTE — Telephone Encounter (Signed)
Requested medication (s) are due for refill today - yes  Requested medication (s) are on the active medication list - yes  Future visit scheduled -yes  Last refill: 2 months  Notes to clinic: Call to patient- patient states she has been on medication long term- she did have visit for bronchitis in December-(FYI- Inhaler has worker wonderfully) she states she is out of state until June and has an appointment scheduled. She wants to know if she has to have visit - can she do virtual visit since she is out of state? Patient informed I would send response into office for review. Request non delegated Rx Requested Prescriptions  Pending Prescriptions Disp Refills   tiZANidine (ZANAFLEX) 2 MG tablet 30 tablet 0    Sig: Take 1 tablet (2 mg total) by mouth every 8 (eight) hours as needed for muscle spasms.      Not Delegated - Cardiovascular:  Alpha-2 Agonists - tizanidine Failed - 10/28/2020 11:39 AM      Failed - This refill cannot be delegated      Failed - Valid encounter within last 6 months    Recent Outpatient Visits           9 months ago Chronic obstructive pulmonary disease with acute exacerbation [facility])   [facility] Delsa Grana, PA-C   10 months ago Adult general medical exam   [facility] Delsa Grana, PA-C   1 year ago Hypernatremia   [facility] Delsa Grana, PA-C   1 year ago COPD with acute exacerbation [facility])   [facility] Delsa Grana, PA-C   2 years ago Preventative health care   [facility] [place], Satira Anis, MD       Future Appointments             In 3 months Delsa Grana, PA-C [facility], [place]   In 3 months Belva Crome, MD [place], [facility]                 Requested Prescriptions  Pending Prescriptions Disp Refills   tiZANidine (ZANAFLEX) 2 MG tablet 30 tablet 0    Sig: Take 1 tablet (2 mg total) by  mouth every 8 (eight) hours as needed for muscle spasms.      Not Delegated - Cardiovascular:  Alpha-2 Agonists - tizanidine Failed - 10/28/2020 11:39 AM      Failed - This refill cannot be delegated      Failed - Valid encounter within last 6 months    Recent Outpatient Visits           9 months ago Chronic obstructive pulmonary disease with acute exacerbation [facility])   [facility] Delsa Grana, PA-C   10 months ago Adult general medical exam   [facility] Delsa Grana, PA-C   1 year ago Hypernatremia   [facility] Delsa Grana, PA-C   1 year ago COPD with acute exacerbation [facility])   [facility] Delsa Grana, PA-C   2 years ago Preventative health care   [place], Satira Anis, MD       Future Appointments             In 3 months Delsa Grana, PA-C [facility], [place]   In 3 months Tamala Julian, Lynnell Dike, MD [facility], [facility]

## 2020-10-28 NOTE — Telephone Encounter (Signed)
Returned call to pt.  She has been made aware that her refill for the Losartan has been sent to the pharmacy, per her request, 10/20/2020. Pt thanked me for the return call.

## 2020-10-28 NOTE — Telephone Encounter (Signed)
Patient is following up regarding her Losartan refill request. Per patient, the pharmacy made her aware that they are not able to refill her medication because the prescribing provider is not longer with our practice.  Looks like the medication was last prescribed by Kathyrn Drown, NP. The patient is requesting to have the Rx resubmitted with a different provider's name listed.  please assist.   *STAT* If patient is at the pharmacy, call can be transferred to refill team.   1. Which medications need to be refilled? (please list name of each medication and dose if known)  losartan (COZAAR) 25 MG tablet  2. Which pharmacy/location (including street and city if local pharmacy) is medication to be sent to? [id number] [place], [place] [facility] [place]  3. Do they need a 30 day or 90 day supply? 90 day supply

## 2020-10-29 NOTE — Telephone Encounter (Signed)
Called and spoke to pt who stated that she is in [place] and will be coming back in June when she is going to see Dr. Tamala Julian. Pt stated that a 1 month supply of Xarelto cost her the same amount as a 3 month supply. Pt's last CrCl was 113ml/min, will go ahead and send in refill. Will put on appointment note for pt to get blood work when she sees Dr. Tamala Julian.   Pt is on the correct dose of Xarelto per dosing criteria: Xarelto 20mg  daily, prescription refill sent.

## 2020-11-02 MED ORDER — TIZANIDINE HCL 2 MG PO TABS: 2.0000 mg | ORAL_TABLET | Freq: Three times a day (TID) | ORAL | 0 refills | Status: DC | PRN

## 2020-11-02 NOTE — Addendum Note (Signed)
Addended by: Loistine Chance on: 11/02/2020 06:08 PM   Modules accepted: Orders

## 2020-11-03 NOTE — Telephone Encounter (Signed)
lvm informing pt that prescription has been called into the pharmacy. Also informed that it should carry her until her next appt with [facility] and if she is needing another refill before then to go to an urgent care.

## 2020-11-12 ENCOUNTER — Telehealth: Payer: Self-pay

## 2020-11-12 ENCOUNTER — Ambulatory Visit: Payer: Medicare Other

## 2020-11-12 NOTE — Telephone Encounter (Signed)
erro  neous encounter

## 2020-11-19 ENCOUNTER — Ambulatory Visit (INDEPENDENT_AMBULATORY_CARE_PROVIDER_SITE_OTHER): Payer: Medicare Other

## 2020-11-19 DIAGNOSIS — Z0000 Encounter for general adult medical examination without abnormal findings: Secondary | ICD-10-CM

## 2020-11-19 NOTE — Progress Notes (Signed)
Subjective:   Patricia Fischer is a 65 y.o. female who presents for an Initial Medicare Annual Wellness Visit.  Virtual Visit via Telephone Note  I connected with  Patricia Fischer on 11/19/20 at  3:30 PM EDT by telephone and verified that I am speaking with the correct person using two identifiers.  Location: Patient: home Provider: [place] Persons participating in the virtual visit: [name]   I discussed the limitations, risks, security and privacy concerns of performing an evaluation and management service by telephone and the availability of in person appointments. The patient expressed understanding and agreed to proceed.  Interactive audio and video telecommunications were attempted between this nurse and patient, however failed, due to patient having technical difficulties OR patient did not have access to video capability.  We continued and completed visit with audio only.  Some vital signs may be absent or patient reported.   Clemetine Marker, LPN    Review of Systems     Cardiac Risk Factors include: advanced age (>81men, >61 women);dyslipidemia;hypertension;obesity (BMI >30kg/m2)     Objective:    There were no vitals filed for this visit. There is no height or weight on file to calculate BMI.  Advanced Directives 11/19/2020 09/07/2017 08/10/2017 06/26/2017 11/25/2016 08/04/2016 07/27/2016  Does Patient Have a Medical Advance Directive? No No No No No No No  Type of Advance Directive - - - - - - -  Copy of Healthcare Power of Attorney in Chart? - - - - - - -  Would patient like information on creating a medical advance directive? No - Patient declined No - Patient declined No - Patient declined - - - -  Pre-existing out of facility DNR order (yellow form or pink MOST form) - - - - - - -    Current Medications (verified) Outpatient Encounter Medications as of 11/19/2020  Medication Sig  . albuterol (VENTOLIN HFA) 108 (90 Base) MCG/ACT inhaler Inhale  2 puffs into the lungs every 6 (six) hours as needed for wheezing or shortness of breath.  . budesonide-formoterol (SYMBICORT) 160-4.5 MCG/ACT inhaler Inhale 2 puffs into the lungs 2 (two) times daily. Rinse out mouth and spit after each use  . carvedilol (COREG) 6.25 MG tablet Take 1 tablet (6.25 mg total) by mouth 2 (two) times daily.  . chlorpheniramine (CHLOR-TRIMETON) 4 MG tablet Take 4 mg by mouth daily as needed for allergies.  . fluticasone (FLONASE) 50 MCG/ACT nasal spray INSTILL 1 SPRAY INTO EACH NOSTRIL EVERY DAY AS NEEDED  . losartan (COZAAR) 25 MG tablet Take 1 tablet (25 mg total) by mouth daily.  Marland Kitchen spironolactone (ALDACTONE) 25 MG tablet TAKE 1 TABLET BY MOUTH EVERY DAY ON MONDAY,WEDNESDAY AND FRIDAYS  . tiZANidine (ZANAFLEX) 2 MG tablet Take 1 tablet (2 mg total) by mouth every 8 (eight) hours as needed for muscle spasms.  . vitamin B-12 (CYANOCOBALAMIN) 500 MCG tablet Take 2,500 mcg by mouth daily.  . vitamin C (ASCORBIC ACID) 500 MG tablet Take 500 mg by mouth daily.  Alveda Reasons 20 MG TABS tablet TAKE 1 TABLET BY MOUTH EVERY DAY  . cetirizine (ZYRTEC) 10 MG tablet Take 10 mg by mouth daily. (Patient not taking: Reported on 11/19/2020)  . [DISCONTINUED] predniSONE (DELTASONE) 20 MG tablet 3 tabs poqday 1-3, 2 tabs poqday 4-6, 1 tab poqday 7-9   No facility-administered encounter medications on file as of 11/19/2020.    Allergies (verified) Gadolinium derivatives, Levaquin [levofloxacin in d5w], Sulfa antibiotics, Vancomycin, Aripiprazole, Codeine, Oxycodone,  and Penicillins   History: Past Medical History:  Diagnosis Date  . Anxiety   . Asthma   . Blood transfusion [place]  . Breast cancer, left breast ([place]) 2011   DCIS  . Chronic systolic CHF w/ Improved LVF 05/05/2015   NICM // Dx 04/2015 - EF initially 25-30% with RV dysfunction // f/u echo 08/2015 technically difficult, EF 35-40%, anterior, anteroseptal, apical and inferoapical severe hypokinesis suggestive of  LAD territory ischemia/infarct, grade 1 DD, mild MR, RV normal. // Echo 5/18: mild LVH, EF 30-35, Gr 1 DD // cMRI 12/18: EF 52   . Depression   . DVT (deep venous thrombosis) ([place]) Aug. 2016   a. Dx 04/2015 - patient returned 05/2015 after noncompliance with Xarelto.  . Environmental allergies   . Essential hypertension   . Family Hx of adverse reaction to anesthesia    " My Mother would get deathly sick "  . Gastroesophageal reflux disease   . Head injury 2011  . Hemorrhoids   . History of cardiac catheterization    Cath 10/2015: oLCx 50 (prob cath induced spasm), no sig CAD, EF 35-45  . LBBB (left bundle branch block)   . Migraine HAs 11/2014  . Obesity   . Orthostatic hypotension   . Pneumonia 2012  . 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 2016   a. Dx 04/2015 - patient returned 05/2015 after noncompliance with Xarelto.  . Shortness of breath dyspnea   . Syncope    a. Recurrent syncope in 2016 felt 2/2 orthostasis in setting of PE.   Past Surgical History:  Procedure Laterality Date  . ABDOMINAL HYSTERECTOMY  1986  . ANAL FISSURE REPAIR    . ANTERIOR CERVICAL DECOMP/DISCECTOMY FUSION  01/23/2012   Procedure: ANTERIOR CERVICAL DECOMPRESSION/DISCECTOMY FUSION 1 LEVEL;  Surgeon: Elaina Hoops, MD;  Location: [facility];  Service: Neurosurgery;  Laterality: N/A;  Cervical five-six anterior cervical discectomy fusion with plating  . APPENDECTOMY    . BREAST BIOPSY Left 06/2016   US biopsy-PAPILLARY CYSTIC APOCRINE CHANGE   . BREAST SURGERY Left 2011   Breast lumpectomy  . CARDIAC CATHETERIZATION  2010   [facility]; pt states it was "clear"  . CARDIAC CATHETERIZATION N/A 10/28/2015   Procedure: Right/Left Heart Cath and Coronary Angiography;  Surgeon: Belva Crome, MD;  Location: [facility];  Service: Cardiovascular;  Laterality: N/A;  . CARPAL TUNNEL RELEASE Bilateral   . CESAREAN SECTION  1982, 1979  . CHOLECYSTECTOMY    . COLONOSCOPY  01/16/2009   Dr Bary Castilla  . KNEE  CARTILAGE SURGERY Left   . SHOULDER ARTHROSCOPY WITH SUBACROMIAL DECOMPRESSION, ROTATOR CUFF REPAIR AND BICEP TENDON REPAIR Right 11/22/2013   Procedure: RIGHT SHOULDER ATHROSCOPY OPEN SUBSCAP REPAIR DELTA-PECTORAL ;  Surgeon: Augustin Schooling, MD;  Location: [facility];  Service: Orthopedics;  Laterality: Right;  . TARSAL TUNNEL RELEASE Left 1990  . TONSILLECTOMY    . TUBAL LIGATION     Family History  Problem Relation Age of Onset  . Breast cancer Paternal Aunt   . Breast cancer Mother 84  . Migraines Mother   . Cancer Mother   . Hypertension Mother   . Rectal cancer Father   . Cancer Father   . COPD Father   . Heart disease Paternal Grandmother   . Stroke Paternal Grandmother   . Heart attack Paternal Grandmother   . Aneurysm Paternal Grandfather   . COPD Paternal Grandfather   . Breast cancer Cousin   .  Breast cancer Cousin   . Breast cancer Cousin   . Anesthesia problems Neg Hx   . Diabetes Neg Hx    Social History   Socioeconomic History  . Marital status: Divorced    Spouse name: Not on file  . Number of children: 2  . Years of education: 5  . Highest education level: Some college, no degree  Occupational History  . Occupation: retired  Tobacco Use  . Smoking status: Never Smoker  . Smokeless tobacco: Never Used  Vaping Use  . Vaping Use: Never used  Substance and Sexual Activity  . Alcohol use: Not Currently    Comment: occasional wine  . Drug use: No  . Sexual activity: Yes  Other Topics Concern  . Not on file  Social History Narrative   Independent at baseline. Boyfriend lives in [place] when she stays 3/4 of the time.    Social Determinants of Health   Financial Resource Strain: Low Risk   . Difficulty of Paying Living Expenses: Not hard at all  Food Insecurity: No Food Insecurity  . Worried About Charity fundraiser in the Last Year: Never true  . Ran Out of Food in the Last Year: Never true  Transportation Needs: No Transportation Needs  . Lack of  Transportation (Medical): No  . Lack of Transportation (Non-Medical): No  Physical Activity: Inactive  . Days of Exercise per Week: 0 days  . Minutes of Exercise per Session: 0 min  Stress: No Stress Concern Present  . Feeling of Stress : Not at all  Social Connections: Moderately Isolated  . Frequency of Communication with Friends and Family: More than three times a week  . Frequency of Social Gatherings with Friends and Family: Three times a week  . Attends Religious Services: More than 4 times per year  . Active Member of Clubs or Organizations: No  . Attends Archivist Meetings: Never  . Marital Status: Divorced    Tobacco Counseling Counseling given: Not Answered   Clinical Intake:  Pre-visit preparation completed: Yes  Pain : No/denies pain     Nutritional Risks: None Diabetes: No  How often do you need to have someone help you when you read instructions, pamphlets, or other written materials from your doctor or pharmacy?: 1 - Never    Interpreter Needed?: No  Information entered by :: Clemetine Marker LPN   Activities of Daily Living In your present state of health, do you have any difficulty performing the following activities: 11/19/2020 01/22/2020  Hearing? N N  Comment declines hearing aids -  Vision? N N  Difficulty concentrating or making decisions? N N  Walking or climbing stairs? Y N  Dressing or bathing? N N  Doing errands, shopping? N N  Preparing Food and eating ? N -  Using the Toilet? N -  In the past six months, have you accidently leaked urine? Y -  Comment wears pads for protection -  Do you have problems with loss of bowel control? N -  Managing your Medications? N -  Managing your Finances? N -  Housekeeping or managing your Housekeeping? N -  Some recent data might be hidden    Patient Care Team: Delsa Grana, PA-C as PCP - General (Family Medicine) Belva Crome, MD as PCP - Cardiology (Cardiology) Belva Crome, MD as  Consulting Physician (Cardiology)  Indicate any recent Medical Services you may have received from other than [facility] providers in the past year (date may  be approximate).     Assessment:   This is a routine wellness examination for [place].  Hearing/Vision screen  Hearing Screening   125Hz  250Hz  500Hz  1000Hz  2000Hz  3000Hz  4000Hz  6000Hz  8000Hz   Right ear:           Left ear:           Comments: Pt denies hearing difficulty  Vision Screening Comments: Past due for eye exam; established with [facility]  Dietary issues and exercise activities discussed: Current Exercise Habits: The patient does not participate in regular exercise at present, Exercise limited by: cardiac condition(s);respiratory conditions(s)  Goals    . Weight (lb) < 200 lb (90.7 kg)     Pt would like to lose weight over the year with healthy eating and physical activity       Depression Screen PHQ 2/9 Scores 11/19/2020 01/22/2020 12/27/2019 10/04/2019 09/18/2019 08/08/2018 07/11/2017  PHQ - 2 Score 0 0 0 0 0 0 0  PHQ- 9 Score - 0 0 1 0 0 -    Fall Risk Fall Risk  11/19/2020 01/22/2020 12/27/2019 10/04/2019 09/18/2019  Falls in the past year? 0 1 1 0 0  Comment - - - - -  Number falls in past yr: 0 1 1 0 0  Injury with Fall? 0 0 1 0 0  Risk Factor Category  - - - - -  Risk for fall due to : No Fall Risks - - - -  Risk for fall due to: Comment - - - - -  Follow up Falls prevention discussed - - - -    FALL RISK PREVENTION PERTAINING TO THE HOME:   Any stairs in or around the home? No  If so, are there any without handrails? No  Home free of loose throw rugs in walkways, pet beds, electrical cords, etc? Yes  Adequate lighting in your home to reduce risk of falls? Yes   ASSISTIVE DEVICES UTILIZED TO PREVENT FALLS:  Life alert? No  Use of a cane, walker or w/c? No  Grab bars in the bathroom? Yes  Shower chair or bench in shower? No  Elevated toilet seat or a handicapped toilet? No   TIMED UP AND GO:  Was  the test performed? No . Telephonic visit.    Cognitive Function: Normal cognitive status assessed by direct observation by this Nurse Health Advisor. No abnormalities found.          Immunizations Immunization History  Administered Date(s) Administered  . Influenza Inj Mdck Quad Pf 06/21/2019  . Influenza,inj,Quad PF,6+ Mos 07/01/2015, 05/17/2016, 06/26/2017, 08/08/2018  . Pneumococcal Polysaccharide-23 07/13/2007  . Td 09/05/2004  . Tdap 05/17/2016    TDAP status: Up to date  Flu Vaccine status: Due, Education has been provided regarding the importance of this vaccine. Advised may receive this vaccine at local pharmacy or Health Dept. Aware to provide a copy of the vaccination record if obtained from local pharmacy or Health Dept. Verbalized acceptance and understanding.  Pneumococcal vaccine status: Due, Education has been provided regarding the importance of this vaccine. Advised may receive this vaccine at local pharmacy or Health Dept. Aware to provide a copy of the vaccination record if obtained from local pharmacy or Health Dept. Verbalized acceptance and understanding.  Covid-19 vaccine status: Declined, Education has been provided regarding the importance of this vaccine but patient still declined. Advised may receive this vaccine at local pharmacy or Health Dept.or vaccine clinic. Aware to provide a copy of the vaccination record  if obtained from local pharmacy or Health Dept. Verbalized acceptance and understanding.  Qualifies for Shingles Vaccine? Yes   Zostavax completed No   Shingrix Completed?: No.    Education has been provided regarding the importance of this vaccine. Patient has been advised to call insurance company to determine out of pocket expense if they have not yet received this vaccine. Advised may also receive vaccine at local pharmacy or Health Dept. Verbalized acceptance and understanding.  Screening Tests Health Maintenance  Topic Date Due  . INFLUENZA  VACCINE  04/05/2020  . COVID-19 Vaccine (1) 12/05/2020 (Originally 10/28/1960)  . DEXA SCAN  12/26/2020 (Originally 10/28/2019)  . COLONOSCOPY (Pts 45-32yrs Insurance coverage will need to be confirmed)  12/26/2020 (Originally 01/16/2014)  . PNA vac Low Risk Adult (1 of 2 - PCV13) 12/26/2020 (Originally 10/28/2019)  . MAMMOGRAM  12/25/2020  . TETANUS/TDAP  05/17/2026  . Hepatitis C Screening  Completed  . HPV VACCINES  Aged Out    Health Maintenance  Health Maintenance Due  Topic Date Due  . INFLUENZA VACCINE  04/05/2020    Colorectal cancer screening: Type of screening: Colonoscopy. Completed 2010. Repeat every 10 years . Pt declines repeat screening colonoscopy.   Mammogram status: Completed 12/26/19. Repeat every year. Ordered today.   Bone Density status: Completed 2011. Results reflect: Bone density results: NORMAL. Repeat every as directed years. pt declines repeat screening at this time.   Lung Cancer Screening: (Low Dose CT Chest recommended if Age 23-80 years, 30 pack-year currently smoking OR have quit w/in 15years.) does not qualify.   Additional Screening:  Hepatitis C Screening: does qualify; Completed 2006  Vision Screening: Recommended annual ophthalmology exams for early detection of glaucoma and other disorders of the eye. Is the patient up to date with their annual eye exam?  No  Who is the provider or what is the name of the office in which the patient attends annual eye exams? [facility] Screening: Recommended annual dental exams for proper oral hygiene  Community Resource Referral / Chronic Care Management: CRR required this visit?  No   CCM required this visit?  No      Plan:     I have personally reviewed and noted the following in the patient's chart:   . Medical and social history . Use of alcohol, tobacco or illicit drugs  . Current medications and supplements . Functional ability and status . Nutritional status . Physical  activity . Advanced directives . List of other physicians . Hospitalizations, surgeries, and ER visits in previous 12 months . Vitals . Screenings to include cognitive, depression, and falls . Referrals and appointments  In addition, I have reviewed and discussed with patient certain preventive protocols, quality metrics, and best practice recommendations. A written personalized care plan for preventive services as well as general preventive health recommendations were provided to patient.     Clemetine Marker, LPN   [id number]   Nurse Notes: none

## 2020-11-19 NOTE — Patient Instructions (Signed)
Ms. Patricia Fischer , Thank you for taking time to come for your Medicare Wellness Visit. I appreciate your ongoing commitment to your health goals. Please review the following plan we discussed and let me know if I can assist you in the future.   Screening recommendations/referrals: Colonoscopy: done 2010. Due for repeat screening; declined Mammogram: done 12/26/19. Please call [phone] to schedule your mammogram.  Bone Density: done 2011. Due for repeat screening; declined.  Recommended yearly ophthalmology/optometry visit for glaucoma screening and checkup Recommended yearly dental visit for hygiene and checkup  Vaccinations: Influenza vaccine: due Pneumococcal vaccine: due Tdap vaccine: done 05/17/16 Shingles vaccine: Shingrix discussed. Please contact your pharmacy for coverage information.   Covid-19:declined  Advanced directives: Advance directive discussed with you today. Even though you declined this today please call our office should you change your mind and we can give you the proper paperwork for you to fill out.  Conditions/risks identified: Recommend healthy eating and physical activity as tolerated for desired weight loss.   Next appointment: Follow up in one year for your annual wellness visit    Preventive Care 65 Years and Older, Female Preventive care refers to lifestyle choices and visits with your health care provider that can promote health and wellness. What does preventive care include?  A yearly physical exam. This is also called an annual well check.  Dental exams once or twice a year.  Routine eye exams. Ask your health care provider how often you should have your eyes checked.  Personal lifestyle choices, including:  Daily care of your teeth and gums.  Regular physical activity.  Eating a healthy diet.  Avoiding tobacco and drug use.  Limiting alcohol use.  Practicing safe sex.  Taking low-dose aspirin every day.  Taking vitamin and mineral  supplements as recommended by your health care provider. What happens during an annual well check? The services and screenings done by your health care provider during your annual well check will depend on your age, overall health, lifestyle risk factors, and family history of disease. Counseling  Your health care provider may ask you questions about your:  Alcohol use.  Tobacco use.  Drug use.  Emotional well-being.  Home and relationship well-being.  Sexual activity.  Eating habits.  History of falls.  Memory and ability to understand (cognition).  Work and work Statistician.  Reproductive health. Screening  You may have the following tests or measurements:  Height, weight, and BMI.  Blood pressure.  Lipid and cholesterol levels. These may be checked every 5 years, or more frequently if you are over 71 years old.  Skin check.  Lung cancer screening. You may have this screening every year starting at age 57 if you have a 30-pack-year history of smoking and currently smoke or have quit within the past 15 years.  Fecal occult blood test (FOBT) of the stool. You may have this test every year starting at age 89.  Flexible sigmoidoscopy or colonoscopy. You may have a sigmoidoscopy every 5 years or a colonoscopy every 10 years starting at age 73.  Hepatitis C blood test.  Hepatitis B blood test.  Sexually transmitted disease (STD) testing.  Diabetes screening. This is done by checking your blood sugar (glucose) after you have not eaten for a while (fasting). You may have this done every 1-3 years.  Bone density scan. This is done to screen for osteoporosis. You may have this done starting at age 38.  Mammogram. This may be done every 1-2 years. Talk to  your health care provider about how often you should have regular mammograms. Talk with your health care provider about your test results, treatment options, and if necessary, the need for more tests. Vaccines  Your  health care provider may recommend certain vaccines, such as:  Influenza vaccine. This is recommended every year.  Tetanus, diphtheria, and acellular pertussis (Tdap, Td) vaccine. You may need a Td booster every 10 years.  Zoster vaccine. You may need this after age 11.  Pneumococcal 13-valent conjugate (PCV13) vaccine. One dose is recommended after age 10.  Pneumococcal polysaccharide (PPSV23) vaccine. One dose is recommended after age 27. Talk to your health care provider about which screenings and vaccines you need and how often you need them. This information is not intended to replace advice given to you by your health care provider. Make sure you discuss any questions you have with your health care provider. Document Released: 09/18/2015 Document Revised: 05/11/2016 Document Reviewed: 06/23/2015 Elsevier Interactive Patient Education  2017 [employer] Prevention in the Home Falls can cause injuries. They can happen to people of all ages. There are many things you can do to make your home safe and to help prevent falls. What can I do on the outside of my home?  Regularly fix the edges of walkways and driveways and fix any cracks.  Remove anything that might make you trip as you walk through a door, such as a raised step or threshold.  Trim any bushes or trees on the path to your home.  Use bright outdoor lighting.  Clear any walking paths of anything that might make someone trip, such as rocks or tools.  Regularly check to see if handrails are loose or broken. Make sure that both sides of any steps have handrails.  Any raised decks and porches should have guardrails on the edges.  Have any leaves, snow, or ice cleared regularly.  Use sand or salt on walking paths during winter.  Clean up any spills in your garage right away. This includes oil or grease spills. What can I do in the bathroom?  Use night lights.  Install grab bars by the toilet and in the tub and  shower. Do not use towel bars as grab bars.  Use non-skid mats or decals in the tub or shower.  If you need to sit down in the shower, use a plastic, non-slip stool.  Keep the floor dry. Clean up any water that spills on the floor as soon as it happens.  Remove soap buildup in the tub or shower regularly.  Attach bath mats securely with double-sided non-slip rug tape.  Do not have throw rugs and other things on the floor that can make you trip. What can I do in the bedroom?  Use night lights.  Make sure that you have a light by your bed that is easy to reach.  Do not use any sheets or blankets that are too big for your bed. They should not hang down onto the floor.  Have a firm chair that has side arms. You can use this for support while you get dressed.  Do not have throw rugs and other things on the floor that can make you trip. What can I do in the kitchen?  Clean up any spills right away.  Avoid walking on wet floors.  Keep items that you use a lot in easy-to-reach places.  If you need to reach something above you, use a strong step stool that has  a grab bar.  Keep electrical cords out of the way.  Do not use floor polish or wax that makes floors slippery. If you must use wax, use non-skid floor wax.  Do not have throw rugs and other things on the floor that can make you trip. What can I do with my stairs?  Do not leave any items on the stairs.  Make sure that there are handrails on both sides of the stairs and use them. Fix handrails that are broken or loose. Make sure that handrails are as long as the stairways.  Check any carpeting to make sure that it is firmly attached to the stairs. Fix any carpet that is loose or worn.  Avoid having throw rugs at the top or bottom of the stairs. If you do have throw rugs, attach them to the floor with carpet tape.  Make sure that you have a light switch at the top of the stairs and the bottom of the stairs. If you do not  have them, ask someone to add them for you. What else can I do to help prevent falls?  Wear shoes that:  Do not have high heels.  Have rubber bottoms.  Are comfortable and fit you well.  Are closed at the toe. Do not wear sandals.  If you use a stepladder:  Make sure that it is fully opened. Do not climb a closed stepladder.  Make sure that both sides of the stepladder are locked into place.  Ask someone to hold it for you, if possible.  Clearly mark and make sure that you can see:  Any grab bars or handrails.  First and last steps.  Where the edge of each step is.  Use tools that help you move around (mobility aids) if they are needed. These include:  Canes.  Walkers.  Scooters.  Crutches.  Turn on the lights when you go into a dark area. Replace any light bulbs as soon as they burn out.  Set up your furniture so you have a clear path. Avoid moving your furniture around.  If any of your floors are uneven, fix them.  If there are any pets around you, be aware of where they are.  Review your medicines with your doctor. Some medicines can make you feel dizzy. This can increase your chance of falling. Ask your doctor what other things that you can do to help prevent falls. This information is not intended to replace advice given to you by your health care provider. Make sure you discuss any questions you have with your health care provider. Document Released: 06/18/2009 Document Revised: 01/28/2016 Document Reviewed: 09/26/2014 Elsevier Interactive Patient Education  2017 [employer].

## 2020-12-04 ENCOUNTER — Other Ambulatory Visit: Payer: Self-pay | Admitting: Family Medicine

## 2020-12-04 DIAGNOSIS — M62838 Other muscle spasm: Secondary | ICD-10-CM

## 2020-12-04 DIAGNOSIS — J441 Chronic obstructive pulmonary disease with (acute) exacerbation: Secondary | ICD-10-CM

## 2020-12-08 ENCOUNTER — Other Ambulatory Visit: Payer: Self-pay | Admitting: Family Medicine

## 2020-12-08 DIAGNOSIS — J302 Other seasonal allergic rhinitis: Secondary | ICD-10-CM

## 2021-01-03 ENCOUNTER — Other Ambulatory Visit: Payer: Self-pay | Admitting: Family Medicine

## 2021-01-03 DIAGNOSIS — M62838 Other muscle spasm: Secondary | ICD-10-CM

## 2021-01-08 ENCOUNTER — Other Ambulatory Visit: Payer: Self-pay | Admitting: Family Medicine

## 2021-01-08 DIAGNOSIS — J441 Chronic obstructive pulmonary disease with (acute) exacerbation: Secondary | ICD-10-CM

## 2021-02-05 ENCOUNTER — Other Ambulatory Visit: Payer: Self-pay

## 2021-02-05 DIAGNOSIS — M62838 Other muscle spasm: Secondary | ICD-10-CM

## 2021-02-05 MED ORDER — TIZANIDINE HCL 2 MG PO TABS: ORAL_TABLET | ORAL | 0 refills | Status: DC

## 2021-02-08 ENCOUNTER — Encounter: Payer: Medicare Other | Admitting: Family Medicine

## 2021-02-09 ENCOUNTER — Other Ambulatory Visit (HOSPITAL_COMMUNITY): Payer: Medicare Other

## 2021-02-10 NOTE — Progress Notes (Signed)
Cardiology Office Note:    Date:  02/11/2021   ID:  [name], DOB 16-Sep-1955, MRN [id number]  PCP:  Delsa Grana, PA-C  Cardiologist:  Sinclair Grooms, MD   Referring MD: Delsa Grana, PA-C   No chief complaint on file.   History of Present Illness:    Patricia Fischer is a 65 y.o. female with a hx of  pulmonary embolism, chronic systolic heart failure, essential hypertension, and chronic anticoagulation therapy.  EF 52% by MRI (2018, December).  Recently, she has been more short of breath.  Feels that something is different.  Was in [facility] and could not walk 75 feet without having to stop and rest.  Since she had pulmonary emboli 6 years ago she has slept on 2 pillows.  That has not changed.  Lower extremity edema which was previously prevalent has resolved on diuretic therapy.  She denies lightheadedness and syncope.  In the past, diuretic therapy needed to be decreased in intensity because of hypotension.  She denies angina.  Past Medical History:  Diagnosis Date   Anxiety    Asthma    Blood transfusion 1979   Chidbirth   Breast cancer, left breast ([facility]) 2011   DCIS   Chronic systolic CHF w/ Improved LVF 05/05/2015   NICM // Dx 04/2015 - EF initially 25-30% with RV dysfunction // f/u echo 08/2015 technically difficult, EF 35-40%, anterior, anteroseptal, apical and inferoapical severe hypokinesis suggestive of LAD territory ischemia/infarct, grade 1 DD, mild MR, RV normal. // Echo 5/18: mild LVH, EF 30-35, Gr 1 DD // cMRI 12/18: EF 52    Depression    DVT (deep venous thrombosis) ([place]) Aug. 2016   a. Dx 04/2015 - patient returned 05/2015 after noncompliance with Xarelto.   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 2016   a. Dx 04/2015 - patient returned 05/2015 after noncompliance with Xarelto.   Shortness of breath dyspnea    Syncope    a. Recurrent syncope in 2016 felt 2/2 orthostasis in setting of PE.    Past Surgical History:  Procedure Laterality Date   ABDOMINAL HYSTERECTOMY  1986   ANAL FISSURE REPAIR     ANTERIOR CERVICAL DECOMP/DISCECTOMY FUSION  01/23/2012   Procedure: ANTERIOR CERVICAL DECOMPRESSION/DISCECTOMY FUSION 1 LEVEL;  Surgeon: Elaina Hoops, MD;  Location: [facility];  Service: Neurosurgery;  Laterality: N/A;  Cervical five-six anterior cervical discectomy fusion with plating   APPENDECTOMY     BREAST BIOPSY Left 06/2016   US biopsy-PAPILLARY CYSTIC APOCRINE CHANGE    BREAST SURGERY Left 2011   Breast lumpectomy   CARDIAC CATHETERIZATION  2010   [facility]; pt states it was "clear"   CARDIAC CATHETERIZATION N/A 10/28/2015   Procedure: Right/Left Heart Cath and Coronary Angiography;  Surgeon: Belva Crome, MD;  Location: [facility];  Service: Cardiovascular;  Laterality: N/A;   CARPAL TUNNEL RELEASE Bilateral    CESAREAN SECTION  1982,  1979   CHOLECYSTECTOMY     COLONOSCOPY  01/16/2009   Dr Bary Castilla   KNEE CARTILAGE SURGERY Left    SHOULDER ARTHROSCOPY WITH SUBACROMIAL DECOMPRESSION, ROTATOR CUFF REPAIR AND BICEP TENDON REPAIR Right 11/22/2013   Procedure: RIGHT SHOULDER ATHROSCOPY OPEN SUBSCAP REPAIR DELTA-PECTORAL ;  Surgeon: Augustin Schooling, MD;  Location: [facility];  Service: Orthopedics;  Laterality: Right;   TARSAL TUNNEL RELEASE Left 1990   TONSILLECTOMY     TUBAL LIGATION      Current Medications: Current Meds  Medication Sig   albuterol (VENTOLIN HFA) 108 (90 Base) MCG/ACT inhaler Inhale 2 puffs into the lungs every 6 (six) hours as needed for wheezing or shortness of breath.    carvedilol (COREG) 6.25 MG tablet Take 1 tablet (6.25 mg total) by mouth 2 (two) times daily.   cetirizine (ZYRTEC) 10 MG tablet Take 10 mg by mouth daily.   chlorpheniramine (CHLOR-TRIMETON) 4 MG tablet Take 4 mg by mouth daily as needed for allergies.   fluticasone (FLONASE) 50 MCG/ACT nasal spray INSTILL 1 SPRAY INTO EACH NOSTRIL EVERY DAY AS NEEDED   losartan (COZAAR) 25 MG tablet Take 1 tablet (25 mg total) by mouth daily.   spironolactone (ALDACTONE) 25 MG tablet TAKE 1 TABLET BY MOUTH EVERY DAY ON MONDAY,WEDNESDAY AND FRIDAYS   SYMBICORT 160-4.5 MCG/ACT inhaler INHALE 2 PUFFS INTO THE LUNGS 2 (TWO) TIMES DAILY. RINSE OUT MOUTH AND SPIT AFTER EACH USE   tiZANidine (ZANAFLEX) 2 MG tablet TAKE 1 TABLET BY MOUTH EVERY 8 HOURS AS NEEDED FOR MUSCLE SPASM   vitamin B-12 (CYANOCOBALAMIN) 500 MCG tablet Take 2,500 mcg by mouth daily.   vitamin C (ASCORBIC ACID) 500 MG tablet Take 500 mg by mouth daily.   XARELTO 20 MG TABS tablet TAKE 1 TABLET BY MOUTH EVERY DAY     Allergies:   Gadolinium derivatives, Levaquin [levofloxacin in d5w], Sulfa antibiotics, Vancomycin, Aripiprazole, Codeine, Oxycodone, and Penicillins   Social History   Socioeconomic History   Marital status: Divorced    Spouse name: Not on file   Number of children: 2   Years of education: 12   Highest education level: Some college, no degree  Occupational History   Occupation: retired  Tobacco Use   Smoking status: Never   Smokeless tobacco: Never  Scientific laboratory technician Use: Never used  Substance and Sexual Activity   Alcohol use: Not Currently    Comment: occasional wine   Drug use: No   Sexual activity: Yes  Other Topics Concern   Not on file  Social History Narrative   Independent at baseline. Boyfriend lives in [place] when she stays 3/4 of the time.    Social Determinants of Health   Financial Resource Strain: Low Risk    Difficulty of Paying Living Expenses: Not hard at all  Food Insecurity: No Food  Insecurity   Worried About Charity fundraiser in the Last Year: Never true   [place] in the Last Year: Never true  Transportation Needs: No Transportation Needs   Lack of Transportation (Medical): No   Lack of Transportation (Non-Medical): No  Physical Activity: Inactive   Days of Exercise per Week: 0 days   Minutes of Exercise per Session: 0 min  Stress: No Stress Concern Present   Feeling of Stress : Not at all  Social Connections: Moderately Isolated   Frequency of Communication with Friends and Family: More than three times a week   Frequency of Social Gatherings  with Friends and Family: Three times a week   Attends Religious Services: More than 4 times per year   Active Member of Clubs or Organizations: No   Attends Archivist Meetings: Never   Marital Status: Divorced     Family History: The patient's family history includes Aneurysm in her paternal grandfather; Breast cancer in her cousin, cousin, cousin, and paternal aunt; Breast cancer (age of onset: 104) in her mother; COPD in her father and paternal grandfather; Cancer in her father and mother; Heart attack in her paternal grandmother; Heart disease in her paternal grandmother; Hypertension in her mother; Migraines in her mother; Rectal cancer in her father; Stroke in her paternal grandmother. There is no history of Anesthesia problems or Diabetes.  ROS:   Please see the history of present illness.    Some wheezing, shortness of breath on exertion.  No chest pain.  No palpitations.  All other systems reviewed and are negative.  EKGs/Labs/Other Studies Reviewed:    The following studies were reviewed today: Last echo performed in 2018 with EF 30 to 35%.  EKG:  EKG left bundle branch block, sinus rhythm, with no change when compared to the tracing performed in April 2021.  Recent Labs: No results found for requested labs within last 8760 hours.  Recent Lipid Panel    Component Value Date/Time   CHOL  193 09/30/2019 0810   TRIG 152 (H) 09/30/2019 0810   HDL 53 09/30/2019 0810   CHOLHDL 3.6 09/30/2019 0810   CHOLHDL 3.9 08/08/2018 0954   VLDL 18 05/17/2016 1454   LDLCALC 113 (H) 09/30/2019 0810   LDLCALC 104 (H) 08/08/2018 0954    Physical Exam:    VS:  BP 126/88   Pulse 85   Ht 5' 4.5" (1.638 m)   Wt 243 lb (110.2 kg)   SpO2 94%   BMI 41.07 kg/m     Wt Readings from Last 3 Encounters:  02/11/21 243 lb (110.2 kg)  08/10/20 250 lb (113.4 kg)  01/22/20 251 lb (113.9 kg)     GEN: Obese.. No acute distress HEENT: Normal NECK: No JVD. LYMPHATICS: No lymphadenopathy CARDIAC: No murmur. RRR no gallop, or edema. VASCULAR:  Normal Pulses. No bruits. RESPIRATORY:  Clear to auscultation without rales, wheezing or rhonchi  ABDOMEN: Soft, non-tender, non-distended, No pulsatile mass, MUSCULOSKELETAL: No deformity  SKIN: Warm and dry NEUROLOGIC:  Alert and oriented x 3 PSYCHIATRIC:  Normal affect   ASSESSMENT:    1. Chronic systolic CHF (congestive heart failure) ([place])   2. Chronic anticoagulation   3. History of pulmonary embolism   4. Essential hypertension   5. LBBB (left bundle branch block)-rate related    6. Pulmonary hypertension ([place])   7. Morbid obesity ([place])   8. SOB (shortness of breath)    PLAN:    In order of problems listed above:  Continue current therapy eludes losartan, carvedilol, and spironolactone.  Because of increasing shortness of breath, will check a BNP today and hemoglobin.  2D Doppler echocardiogram. Continue Xarelto.  Check hemoglobin and creatinine today. Continue Xarelto. Current blood pressure is excellent. No change in BP. Will be assessed by echo Unchanged BNP  68-month follow-up with Richardson Dopp with my anticipated retirement.   Medication Adjustments/Labs and Tests Ordered: Current medicines are reviewed at length with the patient today.  Concerns regarding medicines are outlined above.  Orders Placed This Encounter   Procedures   CBC   Basic metabolic panel   Pro b natriuretic  peptide   EKG 12-Lead   No orders of the defined types were placed in this encounter.   Patient Instructions  Medication Instructions:  Your physician recommends that you continue on your current medications as directed. Please refer to the Current Medication list given to you today.   *If you need a refill on your cardiac medications before your next appointment, please call your pharmacy*   Lab Work: BMET, CBC and Pro BNP today  If you have labs (blood work) drawn today and your tests are completely normal, you will receive your results only by: [place] (if you have MyChart) OR A paper copy in the mail If you have any lab test that is abnormal or we need to change your treatment, we will call you to review the results.   Testing/Procedures: None   Follow-Up: At [facility], you and your health needs are our priority.  As part of our continuing mission to provide you with exceptional heart care, we have created designated Provider Care Teams.  These Care Teams include your primary Cardiologist (physician) and Advanced Practice Providers (APPs -  Physician Assistants and Nurse Practitioners) who all work together to provide you with the care you need, when you need it.  We recommend signing up for the patient portal called "MyChart".  Sign up information is provided on this After Visit Summary.  MyChart is used to connect with patients for Virtual Visits (Telemedicine).  Patients are able to view lab/test results, encounter notes, upcoming appointments, etc.  Non-urgent messages can be sent to your provider as well.   To learn more about what you can do with MyChart, go to [email or web address].    Your next appointment:   6 month(s)  The format for your next appointment:   In Person  Provider:   Richardson Dopp, PA-C   Other Instructions     Signed, Sinclair Grooms, MD  02/11/2021 4:22 PM     [place]

## 2021-02-11 ENCOUNTER — Encounter: Payer: Self-pay | Admitting: Interventional Cardiology

## 2021-02-11 ENCOUNTER — Ambulatory Visit: Payer: Medicare Other | Admitting: Interventional Cardiology

## 2021-02-11 ENCOUNTER — Other Ambulatory Visit: Payer: Self-pay

## 2021-02-11 VITALS — BP 126/88 | HR 85 | Ht 64.5 in | Wt 243.0 lb

## 2021-02-11 DIAGNOSIS — R0602 Shortness of breath: Secondary | ICD-10-CM

## 2021-02-11 DIAGNOSIS — Z86711 Personal history of pulmonary embolism: Secondary | ICD-10-CM | POA: Diagnosis not present

## 2021-02-11 DIAGNOSIS — I5022 Chronic systolic (congestive) heart failure: Secondary | ICD-10-CM | POA: Diagnosis not present

## 2021-02-11 DIAGNOSIS — I10 Essential (primary) hypertension: Secondary | ICD-10-CM | POA: Diagnosis not present

## 2021-02-11 DIAGNOSIS — I272 Pulmonary hypertension, unspecified: Secondary | ICD-10-CM | POA: Diagnosis not present

## 2021-02-11 DIAGNOSIS — Z7901 Long term (current) use of anticoagulants: Secondary | ICD-10-CM | POA: Diagnosis not present

## 2021-02-11 DIAGNOSIS — I447 Left bundle-branch block, unspecified: Secondary | ICD-10-CM

## 2021-02-11 NOTE — Patient Instructions (Signed)
Medication Instructions:  Your physician recommends that you continue on your current medications as directed. Please refer to the Current Medication list given to you today.   *If you need a refill on your cardiac medications before your next appointment, please call your pharmacy*   Lab Work: BMET, CBC and Pro BNP today  If you have labs (blood work) drawn today and your tests are completely normal, you will receive your results only by: [place] (if you have MyChart) OR A paper copy in the mail If you have any lab test that is abnormal or we need to change your treatment, we will call you to review the results.   Testing/Procedures: None   Follow-Up: At [facility], you and your health needs are our priority.  As part of our continuing mission to provide you with exceptional heart care, we have created designated Provider Care Teams.  These Care Teams include your primary Cardiologist (physician) and Advanced Practice Providers (APPs -  Physician Assistants and Nurse Practitioners) who all work together to provide you with the care you need, when you need it.  We recommend signing up for the patient portal called "MyChart".  Sign up information is provided on this After Visit Summary.  MyChart is used to connect with patients for Virtual Visits (Telemedicine).  Patients are able to view lab/test results, encounter notes, upcoming appointments, etc.  Non-urgent messages can be sent to your provider as well.   To learn more about what you can do with MyChart, go to [email or web address].    Your next appointment:   6 month(s)  The format for your next appointment:   In Person  Provider:   Richardson Dopp, PA-C   Other Instructions

## 2021-02-12 ENCOUNTER — Encounter: Payer: Self-pay | Admitting: *Deleted

## 2021-02-12 ENCOUNTER — Other Ambulatory Visit: Payer: Self-pay | Admitting: *Deleted

## 2021-02-12 ENCOUNTER — Ambulatory Visit (HOSPITAL_COMMUNITY): Payer: Medicare Other | Attending: Cardiology

## 2021-02-12 ENCOUNTER — Telehealth: Payer: Self-pay | Admitting: *Deleted

## 2021-02-12 DIAGNOSIS — I5022 Chronic systolic (congestive) heart failure: Secondary | ICD-10-CM | POA: Diagnosis not present

## 2021-02-12 DIAGNOSIS — I420 Dilated cardiomyopathy: Secondary | ICD-10-CM | POA: Diagnosis not present

## 2021-02-12 LAB — CBC
Hematocrit: 36.3 % (ref 34.0–46.6)
Hemoglobin: 12.6 g/dL (ref 11.1–15.9)
MCH: 28.8 pg (ref 26.6–33.0)
MCHC: 34.7 g/dL (ref 31.5–35.7)
MCV: 83 fL (ref 79–97)
Platelets: 208 10*3/uL (ref 150–450)
RBC: 4.37 x10E6/uL (ref 3.77–5.28)
RDW: 14.3 % (ref 11.7–15.4)
WBC: 8.5 10*3/uL (ref 3.4–10.8)

## 2021-02-12 LAB — PRO B NATRIURETIC PEPTIDE: NT-Pro BNP: 1112 pg/mL — ABNORMAL HIGH (ref 0–301)

## 2021-02-12 LAB — BASIC METABOLIC PANEL
BUN/Creatinine Ratio: 12 (ref 12–28)
BUN: 9 mg/dL (ref 8–27)
CO2: 27 mmol/L (ref 20–29)
Calcium: 9.2 mg/dL (ref 8.7–10.3)
Chloride: 103 mmol/L (ref 96–106)
Creatinine, Ser: 0.77 mg/dL (ref 0.57–1.00)
Glucose: 109 mg/dL — ABNORMAL HIGH (ref 65–99)
Potassium: 4.6 mmol/L (ref 3.5–5.2)
Sodium: 144 mmol/L (ref 134–144)
eGFR: 85 mL/min/{1.73_m2} (ref >59)

## 2021-02-12 LAB — ECHOCARDIOGRAM COMPLETE
Area-P 1/2: 3.99 cm2
S' Lateral: 5.2 cm

## 2021-02-12 MED ORDER — DAPAGLIFLOZIN PROPANEDIOL 10 MG PO TABS: 10.0000 mg | ORAL_TABLET | Freq: Every day | ORAL | 11 refills | Status: DC

## 2021-02-12 MED ORDER — SPIRONOLACTONE 25 MG PO TABS: 25.0000 mg | ORAL_TABLET | Freq: Once | ORAL | 3 refills | Status: DC

## 2021-02-12 NOTE — Telephone Encounter (Signed)
Spoke with pt and reviewed results and recommendations per Dr. Tamala Julian.  Pt agreeable to plan.  She will have labs drawn in [employer] [facility] [place].  Pt verbalized understanding and was appreciative for call.

## 2021-02-12 NOTE — Telephone Encounter (Signed)
-----   Message from Belva Crome, MD sent at 02/12/2021  8:07 AM EDT ----- Let the patient know increase spironolactone to 25 mg daily.  Start Farxiga 10 mg/day.  Basic metabolic panel in 1 week. A copy will be sent to Delsa Grana, PA-C

## 2021-02-15 ENCOUNTER — Other Ambulatory Visit: Payer: Self-pay | Admitting: *Deleted

## 2021-02-15 MED ORDER — SPIRONOLACTONE 25 MG PO TABS: 25.0000 mg | ORAL_TABLET | Freq: Every day | ORAL | 3 refills | Status: DC

## 2021-02-16 ENCOUNTER — Other Ambulatory Visit: Payer: Self-pay

## 2021-02-16 ENCOUNTER — Ambulatory Visit (INDEPENDENT_AMBULATORY_CARE_PROVIDER_SITE_OTHER): Payer: Medicare Other | Admitting: Unknown Physician Specialty

## 2021-02-16 ENCOUNTER — Encounter: Payer: Self-pay | Admitting: Unknown Physician Specialty

## 2021-02-16 VITALS — BP 128/80 | HR 94 | Temp 98.0°F | Resp 18 | Ht 65.0 in | Wt 243.6 lb

## 2021-02-16 DIAGNOSIS — I2782 Chronic pulmonary embolism: Secondary | ICD-10-CM

## 2021-02-16 DIAGNOSIS — Z0000 Encounter for general adult medical examination without abnormal findings: Secondary | ICD-10-CM

## 2021-02-16 DIAGNOSIS — Z23 Encounter for immunization: Secondary | ICD-10-CM | POA: Diagnosis not present

## 2021-02-16 DIAGNOSIS — I10 Essential (primary) hypertension: Secondary | ICD-10-CM | POA: Diagnosis not present

## 2021-02-16 DIAGNOSIS — Z6841 Body Mass Index (BMI) 40.0 and over, adult: Secondary | ICD-10-CM

## 2021-02-16 DIAGNOSIS — I5022 Chronic systolic (congestive) heart failure: Secondary | ICD-10-CM

## 2021-02-16 DIAGNOSIS — I2602 Saddle embolus of pulmonary artery with acute cor pulmonale: Secondary | ICD-10-CM | POA: Diagnosis not present

## 2021-02-16 DIAGNOSIS — M62838 Other muscle spasm: Secondary | ICD-10-CM

## 2021-02-16 DIAGNOSIS — E785 Hyperlipidemia, unspecified: Secondary | ICD-10-CM

## 2021-02-16 MED ORDER — TIZANIDINE HCL 2 MG PO TABS: 2.0000 mg | ORAL_TABLET | Freq: Three times a day (TID) | ORAL | 3 refills | Status: DC

## 2021-02-16 NOTE — Assessment & Plan Note (Signed)
Stable, continue present medications.   

## 2021-02-16 NOTE — Patient Instructions (Signed)
Preventive Care 66 Years and Older, Female Preventive care refers to lifestyle choices and visits with your health care provider that can promote health and wellness. This includes: A yearly physical exam. This is also called an annual wellness visit. Regular dental and eye exams. Immunizations. Screening for certain conditions. Healthy lifestyle choices, such as: Eating a healthy diet. Getting regular exercise. Not using drugs or products that contain nicotine and tobacco. Limiting alcohol use. What can I expect for my preventive care visit? Physical exam Your health care provider will check your: Height and weight. These may be used to calculate your BMI (body mass index). BMI is a measurement that tells if you are at a healthy weight. Heart rate and blood pressure. Body temperature. Skin for abnormal spots. Counseling Your health care provider may ask you questions about your: Past medical problems. Family's medical history. Alcohol, tobacco, and drug use. Emotional well-being. Home life and relationship well-being. Sexual activity. Diet, exercise, and sleep habits. History of falls. Memory and ability to understand (cognition). Work and work Statistician. Pregnancy and menstrual history. Access to firearms. What immunizations do I need?  Vaccines are usually given at various ages, according to a schedule. Your health care provider will recommend vaccines for you based on your age, medicalhistory, and lifestyle or other factors, such as travel or where you work. What tests do I need? Blood tests Lipid and cholesterol levels. These may be checked every 5 years, or more often depending on your overall health. Hepatitis C test. Hepatitis B test. Screening Lung cancer screening. You may have this screening every year starting at age 44 if you have a 30-pack-year history of smoking and currently smoke or have quit within the past 15 years. Colorectal cancer screening. All  adults should have this screening starting at age 39 and continuing until age 65. Your health care provider may recommend screening at age 61 if you are at increased risk. You will have tests every 1-10 years, depending on your results and the type of screening test. Diabetes screening. This is done by checking your blood sugar (glucose) after you have not eaten for a while (fasting). You may have this done every 1-3 years. Mammogram. This may be done every 1-2 years. Talk with your health care provider about how often you should have regular mammograms. Abdominal aortic aneurysm (AAA) screening. You may need this if you are a current or former smoker. BRCA-related cancer screening. This may be done if you have a family history of breast, ovarian, tubal, or peritoneal cancers. Other tests STD (sexually transmitted disease) testing, if you are at risk. Bone density scan. This is done to screen for osteoporosis. You may have this done starting at age 54. Talk with your health care provider about your test results, treatment options,and if necessary, the need for more tests. Follow these instructions at home: Eating and drinking  Eat a diet that includes fresh fruits and vegetables, whole grains, lean protein, and low-fat dairy products. Limit your intake of foods with high amounts of sugar, saturated fats, and salt. Take vitamin and mineral supplements as recommended by your health care provider. Do not drink alcohol if your health care provider tells you not to drink. If you drink alcohol: Limit how much you have to 0-1 drink a day. Be aware of how much alcohol is in your drink. In the [place] one drink equals one 12 oz bottle of beer (355 mL), one 5 oz glass of wine (148 mL), or one 1  oz glass of hard liquor (44 mL).  Lifestyle Take daily care of your teeth and gums. Brush your teeth every morning and night with fluoride toothpaste. Floss one time each day. Stay active. Exercise for at  least 30 minutes 5 or more days each week. Do not use any products that contain nicotine or tobacco, such as cigarettes, e-cigarettes, and chewing tobacco. If you need help quitting, ask your health care provider. Do not use drugs. If you are sexually active, practice safe sex. Use a condom or other form of protection in order to prevent STIs (sexually transmitted infections). Talk with your health care provider about taking a low-dose aspirin or statin. Find healthy ways to cope with stress, such as: Meditation, yoga, or listening to music. Journaling. Talking to a trusted person. Spending time with friends and family. Safety Always wear your seat belt while driving or riding in a vehicle. Do not drive: If you have been drinking alcohol. Do not ride with someone who has been drinking. When you are tired or distracted. While texting. Wear a helmet and other protective equipment during sports activities. If you have firearms in your house, make sure you follow all gun safety procedures. What's next? Visit your health care provider once a year for an annual wellness visit. Ask your health care provider how often you should have your eyes and teeth checked. Stay up to date on all vaccines. This information is not intended to replace advice given to you by your health care provider. Make sure you discuss any questions you have with your healthcare provider. Document Revised: 08/12/2020 Document Reviewed: 08/16/2018 Elsevier Patient Education  2022 [employer].

## 2021-02-16 NOTE — Assessment & Plan Note (Signed)
Following with Cardiology.  On Farxiga for heart failure.  Labs followed by cardiology

## 2021-02-16 NOTE — Assessment & Plan Note (Signed)
Rx for Tizanidine which she takes nightly

## 2021-02-16 NOTE — Assessment & Plan Note (Signed)
Would like to lose weight but having trouble with lack of exercise

## 2021-02-16 NOTE — Progress Notes (Signed)
BP 128/80   Pulse 94   Temp 98 F (36.7 C) (Oral)   Resp 18   Ht 5\' 5"  (1.651 m)   Wt 243 lb 9.6 oz (110.5 kg)   SpO2 97%   BMI 40.54 kg/m    Subjective:    Patient ID: Patricia Fischer, female    DOB: [birth year], 65 y.o.   MRN: [id number]  HPI: Patricia Fischer is a 65 y.o. female  Chief Complaint  Patient presents with   Annual Exam   Pt here for general history and physical.    COPD:  Pt is taking daily Symbicort. Once exacerbation in the past year  CHF: Following with cardiology.  Reviewed last echo showing EF 25-30%  Would like to walk more and still pushes herself.  Hgb A1C 7 months ago was 5.6.  Starting [place] for heart failure  Muscle spasms: Takes 2 Tizanidine QHS.  This seems to help her night-time cramps.  Needs refill of Tizanidine.      Depression screen [facility] 2/9 02/16/2021 11/19/2020 01/22/2020 12/27/2019 10/04/2019  Decreased Interest 0 0 0 0 0  Down, Depressed, Hopeless 0 0 0 0 0  PHQ - 2 Score 0 0 0 0 0  Altered sleeping 0 - 0 0 0  Tired, decreased energy 0 - 0 0 1  Change in appetite 0 - 0 0 0  Feeling bad or failure about yourself  0 - 0 0 0  Trouble concentrating 0 - 0 0 0  Moving slowly or fidgety/restless 0 - 0 0 0  Suicidal thoughts 0 - 0 0 0  PHQ-9 Score 0 - 0 0 1  Difficult doing work/chores Not difficult at all - Not difficult at all Not difficult at all Not difficult at all  Some recent data might be hidden   Fall Risk  02/16/2021 11/19/2020 01/22/2020 12/27/2019 10/04/2019  Falls in the past year? 0 0 1 1 0  Comment - - - - -  Number falls in past yr: 0 0 1 1 0  Injury with Fall? 0 0 0 1 0  Risk Factor Category  - - - - -  Risk for fall due to : - No Fall Risks - - -  Risk for fall due to: Comment - - - - -  Follow up Falls evaluation completed Falls prevention discussed - - -   Social History   Socioeconomic History   Marital status: Divorced    Spouse name: Not on file   Number of children: 2   Years of education: 12   Highest  education level: Some college, no degree  Occupational History   Occupation: retired  Tobacco Use   Smoking status: Never   Smokeless tobacco: Never  Vaping Use   Vaping Use: Never used  Substance and Sexual Activity   Alcohol use: Not Currently    Comment: occasional wine   Drug use: No   Sexual activity: Yes  Other Topics Concern   Not on file  Social History Narrative   Independent at baseline. Boyfriend lives in [place] when she stays 3/4 of the time.    Social Determinants of Health   Financial Resource Strain: Low Risk    Difficulty of Paying Living Expenses: Not hard at all  Food Insecurity: No Food Insecurity   Worried About Charity fundraiser in the Last Year: Never true   Ran Out of Food in the Last Year: Never true  Transportation Needs: No Transportation Needs  Lack of Transportation (Medical): No   Lack of Transportation (Non-Medical): No  Physical Activity: Insufficiently Active   Days of Exercise per Week: 3 days   Minutes of Exercise per Session: 20 min  Stress: No Stress Concern Present   Feeling of Stress : Not at all  Social Connections: Moderately Integrated   Frequency of Communication with Friends and Family: More than three times a week   Frequency of Social Gatherings with Friends and Family: More than three times a week   Attends Religious Services: More than 4 times per year   Active Member of Clubs or Organizations: Yes   Attends Music therapist: More than 4 times per year   Marital Status: Divorced  Human resources officer Violence: Not At Risk   Fear of Current or Ex-Partner: No   Emotionally Abused: No   Physically Abused: No   Sexually Abused: No   Family History  Problem Relation Age of Onset   Breast cancer Paternal 41    Breast cancer Mother 50   Migraines Mother    Cancer Mother    Hypertension Mother    Rectal cancer Father    Cancer Father    COPD Father    Heart disease Paternal Grandmother    Stroke Paternal  Grandmother    Heart attack Paternal Grandmother    Aneurysm Paternal Grandfather    COPD Paternal Grandfather    Breast cancer Cousin    Breast cancer Cousin    Breast cancer Cousin    Anesthesia problems Neg Hx    Diabetes Neg Hx    Past Medical History:  Diagnosis Date   Anxiety    Asthma    Blood transfusion 1979   Chidbirth   Breast cancer, left breast ([place]) 2011   DCIS   Chronic systolic CHF w/ Improved LVF 05/05/2015   NICM // Dx 04/2015 - EF initially 25-30% with RV dysfunction // f/u echo 08/2015 technically difficult, EF 35-40%, anterior, anteroseptal, apical and inferoapical severe hypokinesis suggestive of LAD territory ischemia/infarct, grade 1 DD, mild MR, RV normal. // Echo 5/18: mild LVH, EF 30-35, Gr 1 DD // cMRI 12/18: EF 52    Depression    DVT (deep venous thrombosis) ([place]) Aug. 2016   a. Dx 04/2015 - patient returned 05/2015 after noncompliance with Xarelto.   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 Dx 04/2015 - patient returned 05/2015 after noncompliance with Xarelto.   Shortness of breath dyspnea    Syncope    a. Recurrent syncope in 2016 felt 2/2 orthostasis in setting of PE.   Past Surgical History:  Procedure Laterality Date   ABDOMINAL HYSTERECTOMY  1986   ANAL FISSURE REPAIR     ANTERIOR CERVICAL DECOMP/DISCECTOMY FUSION  01/23/2012   Procedure: ANTERIOR CERVICAL DECOMPRESSION/DISCECTOMY FUSION 1 LEVEL;  Surgeon: Elaina Hoops, MD;  Location: [facility];  Service: Neurosurgery;  Laterality: N/A;  Cervical five-six  anterior cervical discectomy  fusion with plating   APPENDECTOMY     BREAST BIOPSY Left 06/2016   US biopsy-PAPILLARY CYSTIC APOCRINE CHANGE    BREAST SURGERY Left 2011   Breast lumpectomy   CARDIAC CATHETERIZATION  2010   [facility]; pt states it was "clear"   CARDIAC CATHETERIZATION N/A 10/28/2015   Procedure: Right/Left Heart Cath and Coronary Angiography;  Surgeon: Belva Crome, MD;  Location: [facility];  Service: Cardiovascular;  Laterality: N/A;   CARPAL TUNNEL RELEASE Bilateral    CESAREAN SECTION  1982, 1979   CHOLECYSTECTOMY     COLONOSCOPY  01/16/2009   Dr Bary Castilla   KNEE CARTILAGE SURGERY Left    SHOULDER ARTHROSCOPY WITH SUBACROMIAL DECOMPRESSION, ROTATOR CUFF REPAIR AND BICEP TENDON REPAIR Right 11/22/2013   Procedure: RIGHT SHOULDER ATHROSCOPY OPEN SUBSCAP REPAIR DELTA-PECTORAL ;  Surgeon: Augustin Schooling, MD;  Location: [facility];  Service: Orthopedics;  Laterality: Right;   TARSAL TUNNEL RELEASE Left 1990   TONSILLECTOMY     TUBAL LIGATION        Relevant past medical, surgical, family and social history reviewed and updated as indicated. Interim medical history since our last visit reviewed. Allergies and medications reviewed and updated.  Review of Systems  Constitutional:  Negative for unexpected weight change.       Would like to walk more and lose weight  HENT: Negative.    Respiratory:  Positive for shortness of breath.   Cardiovascular:  Negative for chest pain, palpitations and leg swelling.  Gastrointestinal: Negative.   Skin: Negative.    Per HPI unless specifically indicated above     Objective:    BP 128/80   Pulse 94   Temp 98 F (36.7 C) (Oral)   Resp 18   Ht 5\' 5"  (1.651 m)   Wt 243 lb 9.6 oz (110.5 kg)   SpO2 97%   BMI 40.54 kg/m   Wt Readings from Last 3 Encounters:  02/16/21 243 lb 9.6 oz (110.5 kg)  02/11/21 243 lb (110.2 kg)  08/10/20 250 lb (113.4 kg)    Physical Exam Constitutional:      Appearance: She is  well-developed.  HENT:     Head: Normocephalic and atraumatic.  Eyes:     General: No scleral icterus.       Right eye: No discharge.        Left eye: No discharge.     Pupils: Pupils are equal, round, and reactive to light.  Neck:     Thyroid: No thyromegaly.     Vascular: No carotid bruit.  Cardiovascular:     Rate and Rhythm: Normal rate and regular rhythm.     Heart sounds: Normal heart sounds. No murmur heard.   No friction rub. No gallop.  Pulmonary:     Effort: Pulmonary effort is normal. No respiratory distress.     Breath sounds: Normal breath sounds. No wheezing or rales.  Abdominal:     General: Bowel sounds are normal.     Palpations: Abdomen is soft.     Tenderness: There is no abdominal tenderness. There is no rebound.  Musculoskeletal:        General: Normal range of motion.     Cervical back: Normal range of motion and neck supple.  Lymphadenopathy:     Cervical: No cervical adenopathy.  Skin:    General: Skin is warm and dry.     Findings: No rash.  Neurological:     Mental Status: She is alert  and oriented to person, place, and time.  Psychiatric:        Speech: Speech normal.        Behavior: Behavior normal.        Thought Content: Thought content normal.        Judgment: Judgment normal.    Results for orders placed or performed in visit on 02/12/21  ECHOCARDIOGRAM COMPLETE  Result Value Ref Range   Area-P 1/2 3.99 cm2   S' Lateral 5.20 cm      Assessment & Plan:   Problem List Items Addressed This Visit       Unprioritized   Chronic systolic CHF (congestive heart failure) [facility])    Following with Cardiology.  On Farxiga for heart failure.  Labs followed by cardiology       Class 3 severe obesity with body mass index (BMI) of 40.0 to 44.9 in adult [facility])    Would like to lose weight but having trouble with lack of exercise       HTN (hypertension)    Stable, continue present medications.         Hyperlipidemia    Lipids followed  through cardiology       Muscle spasms of lower extremity    Rx for Tizanidine which she takes nightly       Relevant Medications   tiZANidine (ZANAFLEX) 2 MG tablet   Saddle pulmonary embolus ([place])    On Xaralto       Other Visit Diagnoses     Need for pneumococcal vaccine    -  Primary   Relevant Orders   Pneumococcal conjugate vaccine 20-valent (Prevnar 20) (Completed)   Routine general medical examination at a health care facility       HM items are updated       Mammogram scheduled  Follow up plan: Return in about 6 months (around 08/18/2021).

## 2021-02-16 NOTE — Assessment & Plan Note (Signed)
On Xaralto

## 2021-02-16 NOTE — Assessment & Plan Note (Signed)
Lipids followed through cardiology

## 2021-02-25 ENCOUNTER — Other Ambulatory Visit: Payer: Self-pay | Admitting: *Deleted

## 2021-02-25 DIAGNOSIS — I5022 Chronic systolic (congestive) heart failure: Secondary | ICD-10-CM | POA: Diagnosis not present

## 2021-02-25 LAB — BASIC METABOLIC PANEL
BUN/Creatinine Ratio: 18 (ref 12–28)
BUN: 13 mg/dL (ref 8–27)
CO2: 25 mmol/L (ref 20–29)
Calcium: 9.2 mg/dL (ref 8.7–10.3)
Chloride: 99 mmol/L (ref 96–106)
Creatinine, Ser: 0.72 mg/dL (ref 0.57–1.00)
Glucose: 114 mg/dL — ABNORMAL HIGH (ref 65–99)
Potassium: 3.7 mmol/L (ref 3.5–5.2)
Sodium: 138 mmol/L (ref 134–144)
eGFR: 92 mL/min/{1.73_m2} (ref >59)

## 2021-02-25 MED ORDER — RIVAROXABAN 20 MG PO TABS: 20.0000 mg | ORAL_TABLET | Freq: Every day | ORAL | 1 refills | Status: DC

## 2021-02-25 NOTE — Telephone Encounter (Signed)
Xarelto 20mg  refill request received. Pt is 65 years old, weight-110.5kg, Crea-0.77 on 02/11/2021, last seen by Dr. Tamala Julian on 02/11/2021, Diagnosis-DVT & PE, CrCl-125.19ml/min; Dose is appropriate based on dosing criteria. Will send in refill to requested pharmacy.

## 2021-03-01 ENCOUNTER — Other Ambulatory Visit: Payer: Self-pay | Admitting: Interventional Cardiology

## 2021-03-02 NOTE — Telephone Encounter (Signed)
Prescription refill request for Xarelto received.  Indication:hx DVT, PE Last office visit: 02/11/2021 Weight: 110.5 kg Age: 65 yo Scr: 0.72 02/25/2021 CrCl:  134 ml/min   Typically PE/Dvt prevention Xarelto dose is 10mg  daily. Spoke with Patricia Fischer and sent staff message to Dr Tamala Julian to advise.

## 2021-03-04 NOTE — Progress Notes (Signed)
Cardiology Office Note:    Date:  03/05/2021   ID:  Patricia Fischer, DOB Mar 11, 1956, MRN [id number]  PCP:  Delsa Grana, PA-C  Cardiologist:  Sinclair Grooms, MD   Referring MD: Delsa Grana, PA-C   Chief Complaint  Patient presents with   Congestive Heart Failure     History of Present Illness:    Patricia Fischer is a 65 y.o. female with a hx of pulmonary embolism, chronic systolic heart failure, essential hypertension, and chronic anticoagulation therapy.  EF 52% by MRI (2018, December) -->25-30%, 02/12/2021.  She has lost her husband since the office visit on 03-11-2023.  He died of hepatic encephalopathy.  We have made adjustments in her medication regimen including adding Farxiga 10 mg/day.  Since the last office visit she has lost 11 pounds.  She has been so distressed by the death of her husband that she cannot tell if she feels better or not.  After I forced her to consider how her breathing is doing, she feels it is better.  Past Medical History:  Diagnosis Date   Anxiety    Asthma    Blood transfusion 1979   Chidbirth   Breast cancer, left breast ([place]) 2011   DCIS   Chronic systolic CHF w/ Improved LVF 05/05/2015   NICM // Dx 04/2015 - EF initially 25-30% with RV dysfunction // f/u echo 08/2015 technically difficult, EF 35-40%, anterior, anteroseptal, apical and inferoapical severe hypokinesis suggestive of LAD territory ischemia/infarct, grade 1 DD, mild MR, RV normal. // Echo 5/18: mild LVH, EF 30-35, Gr 1 DD // cMRI 12/18: EF 52    Depression    DVT (deep venous thrombosis) ([place]) Aug. 2016   a. Dx 04/2015 - patient returned 05/2015 after noncompliance with Xarelto.   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 Dx 04/2015 - patient returned 05/2015 after noncompliance with Xarelto.   Shortness of breath dyspnea    Syncope    a. Recurrent syncope in 2016 felt 2/2 orthostasis in setting of PE.    Past Surgical History:  Procedure Laterality Date   ABDOMINAL HYSTERECTOMY  1986   ANAL FISSURE REPAIR     ANTERIOR CERVICAL DECOMP/DISCECTOMY FUSION  01/23/2012   Procedure: ANTERIOR CERVICAL DECOMPRESSION/DISCECTOMY FUSION 1 LEVEL;  Surgeon: Elaina Hoops, MD;  Location: [facility];  Service: Neurosurgery;  Laterality: N/A;  Cervical five-six anterior cervical discectomy fusion with plating   APPENDECTOMY     BREAST BIOPSY Left 06/2016   US biopsy-PAPILLARY CYSTIC APOCRINE CHANGE    BREAST SURGERY Left 2011   Breast lumpectomy   CARDIAC CATHETERIZATION  2010   [facility]; pt states it was "clear"   CARDIAC CATHETERIZATION N/A 10/28/2015   Procedure: Right/Left Heart Cath and Coronary Angiography;  Surgeon: Belva Crome, MD;  Location: [facility];  Service: Cardiovascular;  Laterality: N/A;   CARPAL TUNNEL RELEASE Bilateral    CESAREAN SECTION  [place]     COLONOSCOPY  01/16/2009   Dr Bary Castilla   KNEE CARTILAGE SURGERY Left    SHOULDER ARTHROSCOPY WITH SUBACROMIAL DECOMPRESSION, ROTATOR CUFF REPAIR AND BICEP TENDON REPAIR Right 11/22/2013   Procedure: RIGHT SHOULDER ATHROSCOPY OPEN SUBSCAP REPAIR DELTA-PECTORAL ;  Surgeon: Augustin Schooling, MD;  Location: [facility];  Service: Orthopedics;  Laterality: Right;   TARSAL TUNNEL RELEASE Left 1990   TONSILLECTOMY     TUBAL LIGATION      Current Medications: Current Meds  Medication Sig   albuterol (VENTOLIN HFA) 108 (90 Base) MCG/ACT inhaler Inhale 2 puffs into the lungs every 6 (six) hours as needed for wheezing or shortness of breath.    carvedilol (COREG) 6.25 MG tablet TAKE 1 TABLET BY MOUTH TWICE A DAY   cetirizine (ZYRTEC) 10 MG tablet Take 10 mg by mouth daily.   chlorpheniramine (CHLOR-TRIMETON) 4 MG tablet Take 4 mg by mouth daily as needed for allergies.   dapagliflozin propanediol (FARXIGA) 10 MG TABS tablet Take 1 tablet (10 mg total) by mouth daily before breakfast.   fluticasone (FLONASE) 50 MCG/ACT nasal spray INSTILL 1 SPRAY INTO EACH NOSTRIL EVERY DAY AS NEEDED   losartan (COZAAR) 25 MG tablet TAKE 1 TABLET (25 MG TOTAL) BY MOUTH DAILY.   rivaroxaban (XARELTO) 20 MG TABS tablet Take 1 tablet (20 mg total) by mouth daily.   spironolactone (ALDACTONE) 25 MG tablet Take 1 tablet (25 mg total) by mouth daily.   SYMBICORT 160-4.5 MCG/ACT inhaler INHALE 2 PUFFS INTO THE LUNGS 2 (TWO) TIMES DAILY. RINSE OUT MOUTH AND SPIT AFTER EACH USE   tiZANidine (ZANAFLEX) 2 MG tablet Take 1 tablet (2 mg total) by mouth 3 (three) times daily. TAKE 1 TABLET BY MOUTH EVERY 8 HOURS AS NEEDED FOR MUSCLE SPASM   vitamin B-12 (CYANOCOBALAMIN) 500 MCG tablet Take 2,500 mcg by mouth daily.   vitamin C (ASCORBIC ACID) 500 MG tablet Take 500 mg by mouth daily.     Allergies:   Gadolinium derivatives, Levaquin [levofloxacin in d5w], Sulfa antibiotics, Vancomycin, Aripiprazole, Codeine, Oxycodone, and Penicillins   Social History   Socioeconomic History   Marital status: Divorced    Spouse name: Not on file   Number of children: 2   Years of education: 12   Highest education level: Some college, no degree  Occupational History   Occupation: retired  Tobacco Use   Smoking status: Never   Smokeless tobacco: Never  Scientific laboratory technician Use: Never used  Substance and Sexual Activity   Alcohol use: Not Currently    Comment: occasional wine   Drug use: No   Sexual activity: Yes  Other Topics Concern   Not on file  Social History Narrative   Independent at baseline. Boyfriend lives in [place] when she stays 3/4 of the time.    Social  Determinants of Health   Financial Resource Strain: Low Risk    Difficulty of Paying Living Expenses: Not hard at all  Food Insecurity: No Food Insecurity   Worried About Charity fundraiser in the Last Year: Never true   [place] in the Last Year: Never true  Transportation Needs: No Transportation Needs   Lack of Transportation (Medical): No   Lack of Transportation (Non-Medical): No  Physical Activity: Insufficiently Active   Days of Exercise per Week: 3 days   Minutes of Exercise per Session: 20 min  Stress: No Stress Concern Present   Feeling of  Stress : Not at all  Social Connections: Moderately Integrated   Frequency of Communication with Friends and Family: More than three times a week   Frequency of Social Gatherings with Friends and Family: More than three times a week   Attends Religious Services: More than 4 times per year   Active Member of [employer] or Organizations: Yes   Attends Music therapist: More than 4 times per year   Marital Status: Divorced     Family History: The patient's family history includes Aneurysm in her paternal grandfather; Breast cancer in her cousin, cousin, cousin, and paternal aunt; Breast cancer (age of onset: 64) in her mother; COPD in her father and paternal grandfather; Cancer in her father and mother; Heart attack in her paternal grandmother; Heart disease in her paternal grandmother; Hypertension in her mother; Migraines in her mother; Rectal cancer in her father; Stroke in her paternal grandmother. There is no history of Anesthesia problems or Diabetes.  ROS:   Please see the history of present illness.    Depressed, tearful, and anxious reflecting her continued morning of the sudden loss of her husband who had cirrhosis and hepatic encephalopathy.  He was being worked up to receive a liver transplant.  All other systems reviewed and are negative.  EKGs/Labs/Other Studies Reviewed:    The following studies were reviewed  today:  2 D Doppler Echocardiogram 02/12/2021  IMPRESSIONS   1. Left ventricular ejection fraction, by estimation, is 25 to 30%. The  left ventricle has severely decreased function. The left ventricle  demonstrates global hypokinesis. The left ventricular internal cavity size  was moderately to severely dilated.  There is mild concentric left ventricular hypertrophy. Left ventricular  diastolic parameters are consistent with Grade II diastolic dysfunction  (pseudonormalization). Elevated left atrial pressure.   2. Right ventricular systolic function is normal. The right ventricular  size is normal. Tricuspid regurgitation signal is inadequate for assessing  PA pressure.   3. Left atrial size was mildly dilated.   4. The mitral valve is normal in structure. Mild mitral valve  regurgitation. No evidence of mitral stenosis.   5. The aortic valve is tricuspid. Aortic valve regurgitation is not  visualized. Mild aortic valve sclerosis is present, with no evidence of  aortic valve stenosis.   6. The inferior vena cava is normal in size with <50% respiratory  variability, suggesting right atrial pressure of 8 mmHg.   Comparison(s): Compared to prior TTE in 2018, the LVEF appears closer to  30% (previously 30-35%) and there is moderate-to-severe LV dilation with  LVIDd 6.2cm.   EKG:  EKG not repeated  Recent Labs: 02/11/2021: Hemoglobin 12.6; NT-Pro BNP 1,112; Platelets 208 02/25/2021: BUN 13; Creatinine, Ser 0.72; Potassium 3.7; Sodium 138  Recent Lipid Panel    Component Value Date/Time   CHOL 193 09/30/2019 0810   TRIG 152 (H) 09/30/2019 0810   HDL 53 09/30/2019 0810   CHOLHDL 3.6 09/30/2019 0810   CHOLHDL 3.9 08/08/2018 0954   VLDL 18 05/17/2016 1454   LDLCALC 113 (H) 09/30/2019 0810   LDLCALC 104 (H) 08/08/2018 0954    Physical Exam:    VS:  BP 98/78   Pulse 84   Ht 5\' 5"  (1.651 m)   Wt 232 lb 6.4 oz (105.4 kg)   SpO2 97%   BMI 38.67 kg/m     Wt Readings from Last 3  Encounters:  03/05/21 232 lb 6.4 oz (105.4 kg)  02/16/21 243 lb 9.6 oz (110.5 kg)  02/11/21 243 lb (110.2 kg)     GEN: Obese but has lost 11 pounds since last seen 4 weeks ago.. No acute distress HEENT: Normal NECK: No JVD. LYMPHATICS: No lymphadenopathy CARDIAC: No murmur. RRR summation gallop is audible.  Resolution of lower extremity edema. VASCULAR:  Normal Pulses. No bruits. RESPIRATORY:  Clear to auscultation without rales, wheezing or rhonchi  ABDOMEN: Soft, non-tender, non-distended, No pulsatile mass, MUSCULOSKELETAL: No deformity  SKIN: Warm and dry NEUROLOGIC:  Alert and oriented x 3 PSYCHIATRIC:  Normal affect   ASSESSMENT:    1. Chronic systolic CHF (congestive heart failure) ([place])   2. Chronic anticoagulation   3. History of pulmonary embolism   4. LBBB (left bundle branch block)-rate related    5. Essential hypertension   6. Morbid obesity ([place])    PLAN:    In order of problems listed above:  Refer to advanced heart failure clinic.  She seems euvolemic on exam today.  She does have left bundle branch block with QRSdD of 154 ms.  Resynchronization would be a consideration.  May need sleep apnea evaluation. Continue therapy as is. No recurrence. Consider resynchronization therapy. Blood pressure is low normal on quadruple heart failure therapy. Morbid obesity.  Rule out concomitant sleep apnea.   Medication Adjustments/Labs and Tests Ordered: Current medicines are reviewed at length with the patient today.  Concerns regarding medicines are outlined above.  No orders of the defined types were placed in this encounter.  No orders of the defined types were placed in this encounter.   There are no Patient Instructions on file for this visit.   Signed, Sinclair Grooms, MD  03/05/2021 11:20 AM    [place]

## 2021-03-05 ENCOUNTER — Other Ambulatory Visit: Payer: Self-pay

## 2021-03-05 ENCOUNTER — Ambulatory Visit: Payer: Medicare Other | Admitting: Interventional Cardiology

## 2021-03-05 ENCOUNTER — Encounter: Payer: Self-pay | Admitting: Interventional Cardiology

## 2021-03-05 ENCOUNTER — Other Ambulatory Visit: Payer: Self-pay | Admitting: *Deleted

## 2021-03-05 VITALS — BP 98/78 | HR 84 | Ht 65.0 in | Wt 232.4 lb

## 2021-03-05 DIAGNOSIS — I10 Essential (primary) hypertension: Secondary | ICD-10-CM | POA: Diagnosis not present

## 2021-03-05 DIAGNOSIS — I5022 Chronic systolic (congestive) heart failure: Secondary | ICD-10-CM

## 2021-03-05 DIAGNOSIS — Z7901 Long term (current) use of anticoagulants: Secondary | ICD-10-CM

## 2021-03-05 DIAGNOSIS — I447 Left bundle-branch block, unspecified: Secondary | ICD-10-CM

## 2021-03-05 DIAGNOSIS — Z86711 Personal history of pulmonary embolism: Secondary | ICD-10-CM | POA: Diagnosis not present

## 2021-03-05 NOTE — Patient Instructions (Signed)
Medication Instructions:  Your physician recommends that you continue on your current medications as directed. Please refer to the Current Medication list given to you today.  *If you need a refill on your cardiac medications before your next appointment, please call your pharmacy*   Lab Work: BMET in 1 month  If you have labs (blood work) drawn today and your tests are completely normal, you will receive your results only by: [place] (if you have MyChart) OR A paper copy in the mail If you have any lab test that is abnormal or we need to change your treatment, we will call you to review the results.   Testing/Procedures: None   Follow-Up: At [facility], you and your health needs are our priority.  As part of our continuing mission to provide you with exceptional heart care, we have created designated Provider Care Teams.  These Care Teams include your primary Cardiologist (physician) and Advanced Practice Providers (APPs -  Physician Assistants and Nurse Practitioners) who all work together to provide you with the care you need, when you need it.  We recommend signing up for the patient portal called "MyChart".  Sign up information is provided on this After Visit Summary.  MyChart is used to connect with patients for Virtual Visits (Telemedicine).  Patients are able to view lab/test results, encounter notes, upcoming appointments, etc.  Non-urgent messages can be sent to your provider as well.   To learn more about what you can do with MyChart, go to [email or web address].    Your next appointment:   6 month(s)  The format for your next appointment:   In Person  Provider:   You will see one of the following Advanced Practice Providers on your designated Care Team:   Richardson Dopp, PA-C    Other Instructions  You have been referred to the CHF clinic

## 2021-03-10 MED ORDER — RIVAROXABAN 10 MG PO TABS: 10.0000 mg | ORAL_TABLET | Freq: Every day | ORAL | 5 refills | Status: DC

## 2021-03-10 NOTE — Telephone Encounter (Signed)
Called and spoke to pt. Pt stated that she has never seen a hematologist and that Dr. Tamala Julian is the only physician who instructs her on her Xarelto. Pt stated that she has enough Xarelto 20mg  to last her through August. Spoke to [facility] D. Pt can finish her supply of Xarelto 20mg  daily  and then can switch to her Xarelto 10mg  daily. Updated pt and made her aware.   Pt also stated that she was in [place] and just buried her husband. Will send refill to [facility] in [place] where pt lives. [facility] in [place] and in [place] to make sure they had correct prescription of file Xarelto 10mg  daily.

## 2021-03-10 NOTE — Telephone Encounter (Signed)
Supple, Harlon Flor, Patricia Fischer  Patricia Feil, RN She was never evaluated by hematology per Epic or Care Everywhere notes. PCP didn't comment on dosing at all and her PCP retired a few  years ago, she's seen 2 different PAs at that practice since so they likely wouldn't have additional insight into her history. Can confirm with pt that she has not seen hematology and if not, can decrease Xarelto dose to 10mg  daily for prevention of recurrent VTE. Refills had been sent in by cardiology based on criteria for afib rather than recurrent VTE indication which is likely why she has remained on the 20mg  daily dosing for so long.         Previous Messages    ----- Message -----  From: Patricia Feil, RN  Sent: 03/09/2021   1:35 PM EDT  To: Leeroy Bock, Patricia Fischer    ----- Message -----  From: Belva Crome, MD  Sent: 03/05/2021   5:08 PM EDT  To: Patricia Feil, RN   I would say that it is okay to decrease the dose of Xarelto to 10 mg/day if no instruction to take 20 mg/day has been given by a hematologist for a physician who did a hypercoagulability work-up.  I am sure that I did not recommend 20 mg/day.  I would investigate this before changing the dose.  ----- Message -----  From: Patricia Feil, RN  Sent: 03/02/2021   2:18 PM EDT  To: Belva Crome, MD, Leeroy Bock, Patricia Fischer   Hi Dr. Tamala Julian,   Received a Xarelto 20mg  refill request for the pt. Per her chart it looks like she is on Xarelto for a hx of PE/DVT back in 2016. Typically Xarelto is 10 mg daily for DVT/PE prevention. Please advise.   Age: 65 yo  Scr: 0.72, 02/25/2021  Weight: 110.5 kg  CrCl: 134 ml/min   Thank you!  Ebony Hail RN

## 2021-03-30 DIAGNOSIS — I5022 Chronic systolic (congestive) heart failure: Secondary | ICD-10-CM | POA: Diagnosis not present

## 2021-03-31 LAB — BASIC METABOLIC PANEL
BUN/Creatinine Ratio: 14 (ref 12–28)
BUN: 11 mg/dL (ref 8–27)
CO2: 25 mmol/L (ref 20–29)
Calcium: 9.1 mg/dL (ref 8.7–10.3)
Chloride: 106 mmol/L (ref 96–106)
Creatinine, Ser: 0.79 mg/dL (ref 0.57–1.00)
Glucose: 105 mg/dL — ABNORMAL HIGH (ref 65–99)
Potassium: 4.2 mmol/L (ref 3.5–5.2)
Sodium: 144 mmol/L (ref 134–144)
eGFR: 82 mL/min/{1.73_m2} (ref >59)

## 2021-04-28 DIAGNOSIS — D0471 Carcinoma in situ of skin of right lower limb, including hip: Secondary | ICD-10-CM | POA: Diagnosis not present

## 2021-04-28 DIAGNOSIS — L918 Other hypertrophic disorders of the skin: Secondary | ICD-10-CM | POA: Diagnosis not present

## 2021-04-28 DIAGNOSIS — L300 Nummular dermatitis: Secondary | ICD-10-CM | POA: Diagnosis not present

## 2021-04-28 DIAGNOSIS — D485 Neoplasm of uncertain behavior of skin: Secondary | ICD-10-CM | POA: Diagnosis not present

## 2021-04-28 DIAGNOSIS — L821 Other seborrheic keratosis: Secondary | ICD-10-CM | POA: Diagnosis not present

## 2021-04-28 DIAGNOSIS — C44729 Squamous cell carcinoma of skin of left lower limb, including hip: Secondary | ICD-10-CM | POA: Diagnosis not present

## 2021-05-03 ENCOUNTER — Other Ambulatory Visit: Payer: Self-pay

## 2021-05-03 ENCOUNTER — Telehealth (INDEPENDENT_AMBULATORY_CARE_PROVIDER_SITE_OTHER): Payer: Medicare Other | Admitting: Nurse Practitioner

## 2021-05-03 ENCOUNTER — Encounter: Payer: Self-pay | Admitting: Nurse Practitioner

## 2021-05-03 DIAGNOSIS — R059 Cough, unspecified: Secondary | ICD-10-CM | POA: Diagnosis not present

## 2021-05-03 DIAGNOSIS — U071 COVID-19: Secondary | ICD-10-CM | POA: Diagnosis not present

## 2021-05-03 DIAGNOSIS — J029 Acute pharyngitis, unspecified: Secondary | ICD-10-CM

## 2021-05-03 MED ORDER — HYDROCOD POLST-CPM POLST ER 10-8 MG/5ML PO SUER: 5.0000 mL | Freq: Two times a day (BID) | ORAL | 0 refills | Status: DC | PRN

## 2021-05-03 NOTE — Addendum Note (Signed)
Addended by: Serafina Royals F on: 05/03/2021 09:57 AM   Modules accepted: Level of Service

## 2021-05-03 NOTE — Progress Notes (Addendum)
Name: Patricia Fischer   MRN: [id number]    DOB: 1956-02-04   Date:05/03/2021       Progress Note  Subjective  Chief Complaint  Chief Complaint  Patient presents with   Covid Positive    Hoarse, sore throat, cough, headache for 4 days    I connected with  [name]  on 05/03/21 at 10:40 AM EDT by a video enabled telemedicine application and verified that I am speaking with the correct person using two identifiers.  I discussed the limitations of evaluation and management by telemedicine and the availability of in person appointments. The patient expressed understanding and agreed to proceed with a virtual visit  Staff also discussed with the patient that there may be a patient responsible charge related to this service. Patient Location: home Provider Location: [facility] Additional Individuals present: alone  HPI  Covid 19: Symptoms started Friday Night.  Fatigue, headache and cough.  Tested positive for Covid-19 on Sunday.  She says she is now having a sore throat from coughing so much.  Denies currently having fever, shortness of breath or chest pain.    COPD: She denies feeling short of breath, monitoring oxygen saturation.  Encouraged to use ventolin as needed.   Patient Active Problem List   Diagnosis Date Noted   Chronic obstructive pulmonary disease ([place]) 01/22/2020   Stress incontinence 12/27/2019   Hyperlipidemia 12/27/2019   Muscle spasms of lower extremity 12/27/2019   Class 3 severe obesity with body mass index (BMI) of 40.0 to 44.9 in adult [facility]) 07/12/2017   Allergic rhinitis 06/26/2017   Hematuria 07/27/2016   Degenerative joint disease (DJD) of hip 03/31/2016   Hx of breast cancer 03/22/2016   HTN (hypertension) 10/07/2015   Depression, major, recurrent, moderate ([place]) 06/11/2015   Anemia 05/15/2015   Do not resuscitate status [id number]   Chronic systolic CHF (congestive heart failure) ([place]) 05/05/2015   Left leg DVT ([place]) 05/05/2015   Pulmonary  hypertension ([place])    Saddle pulmonary embolus ([facility])    LBBB (left bundle branch block)-rate related  04/19/2015   Chronic migraine without aura, with intractable migraine, so stated, with status migrainosus 11/04/2014   Subscapularis (muscle) sprain 11/22/2013    Social History   Tobacco Use   Smoking status: Never   Smokeless tobacco: Never  Substance Use Topics   Alcohol use: Not Currently    Comment: occasional wine     Current Outpatient Medications:    albuterol (VENTOLIN HFA) 108 (90 Base) MCG/ACT inhaler, Inhale 2 puffs into the lungs every 6 (six) hours as needed for wheezing or shortness of breath., Disp: 18 g, Rfl: 2   carvedilol (COREG) 6.25 MG tablet, TAKE 1 TABLET BY MOUTH TWICE A DAY, Disp: 180 tablet, Rfl: 3   cetirizine (ZYRTEC) 10 MG tablet, Take 10 mg by mouth daily., Disp: , Rfl:    chlorpheniramine (CHLOR-TRIMETON) 4 MG tablet, Take 4 mg by mouth daily as needed for allergies., Disp: , Rfl:    dapagliflozin propanediol (FARXIGA) 10 MG TABS tablet, Take 1 tablet (10 mg total) by mouth daily before breakfast., Disp: 30 tablet, Rfl: 11   fluticasone (FLONASE) 50 MCG/ACT nasal spray, INSTILL 1 SPRAY INTO EACH NOSTRIL EVERY DAY AS NEEDED, Disp: 48 mL, Rfl: 3   losartan (COZAAR) 25 MG tablet, TAKE 1 TABLET (25 MG TOTAL) BY MOUTH DAILY., Disp: 90 tablet, Rfl: 3   rivaroxaban (XARELTO) 10 MG TABS tablet, Take 1 tablet (10 mg total) by mouth daily., Disp:  30 tablet, Rfl: 5   spironolactone (ALDACTONE) 25 MG tablet, Take 1 tablet (25 mg total) by mouth daily., Disp: 90 tablet, Rfl: 3   SYMBICORT 160-4.5 MCG/ACT inhaler, INHALE 2 PUFFS INTO THE LUNGS 2 (TWO) TIMES DAILY. RINSE OUT MOUTH AND SPIT AFTER EACH USE, Disp: 10.2 each, Rfl: 0   tiZANidine (ZANAFLEX) 2 MG tablet, Take 1 tablet (2 mg total) by mouth 3 (three) times daily. TAKE 1 TABLET BY MOUTH EVERY 8 HOURS AS NEEDED FOR MUSCLE SPASM, Disp: 180 tablet, Rfl: 3   vitamin B-12 (CYANOCOBALAMIN) 500 MCG tablet, Take 2,500  mcg by mouth daily., Disp: , Rfl:    vitamin C (ASCORBIC ACID) 500 MG tablet, Take 500 mg by mouth daily., Disp: , Rfl:   Allergies  Allergen Reactions   Gadolinium Derivatives Shortness Of Breath and Nausea And Vomiting   Levaquin [Levofloxacin In D5w] Itching    Was giving with vancomycin at same time - unsure which one pt had a reaction to   Sulfa Antibiotics Swelling    "Ears swelled up like [place]"   Vancomycin Itching    Was giving at same time as Levaquin - unsure which pt had reaction to   Aripiprazole     Dry mouth with sores    Codeine Itching and Rash   Oxycodone Itching   Penicillins Hives    Has patient had a PCN reaction causing immediate rash, facial/tongue/throat swelling, SOB or lightheadedness with hypotension: Yes Has patient had a PCN reaction causing severe rash involving mucus membranes or skin necrosis: No Has patient had a PCN reaction that required hospitalization No Has patient had a PCN reaction occurring within the last 10 years: No If all of the above answers are "NO", then may proceed with Cephalosporin use.    I personally reviewed active problem list, medication list, allergies with the patient/caregiver today.  ROS  Constitutional: Negative for fever or weight change.  Respiratory: Positive for cough, no shortness of breath.   Cardiovascular: Negative for chest pain or palpitations.  Gastrointestinal: Negative for abdominal pain, no bowel changes.  Musculoskeletal: Negative for gait problem or joint swelling.  Skin: Negative for rash.  Neurological: Negative for dizziness, positive for headache.  No other specific complaints in a complete review of systems (except as listed in HPI above).   Objective  Virtual encounter, vitals not obtained.  There is no height or weight on file to calculate BMI.  Nursing Note and Vital Signs reviewed.  Physical Exam  Awake, alert and oriented.   No results found for this or any previous visit (from the  past 72 hour(s)).  Assessment & Plan  1. COVID-19  - chlorpheniramine-HYDROcodone (TUSSIONEX PENNKINETIC ER) 10-8 MG/5ML SUER; Take 5 mLs by mouth every 12 (twelve) hours as needed for cough.  Dispense: 115 mL; Refill: 0  - discussed Paxlovid, she declined  - discussed monitoring her pulse oximeter reading and going to the ER if oxygen is lower than 92%.  2. Cough  - chlorpheniramine-HYDROcodone (TUSSIONEX PENNKINETIC ER) 10-8 MG/5ML SUER; Take 5 mLs by mouth every 12 (twelve) hours as needed for cough.  Dispense: 115 mL; Refill: 0   3. Sore Throat  - salt water gargles  - staying hydrated   -Red flags and when to present for emergency care or RTC including fever >101.88F, chest pain, shortness of breath, new/worsening/un-resolving symptoms, reviewed with patient at time of visit. Follow up and care instructions discussed and provided in AVS. - I discussed the assessment and  treatment plan with the patient. The patient was provided an opportunity to ask questions and all were answered. The patient agreed with the plan and demonstrated an understanding of the instructions.  I provided 25 minutes of non-face-to-face time during this encounter.  Bo Merino, FNP

## 2021-06-03 ENCOUNTER — Encounter (HOSPITAL_COMMUNITY): Payer: Self-pay | Admitting: Internal Medicine

## 2021-06-03 ENCOUNTER — Other Ambulatory Visit: Payer: Self-pay

## 2021-06-03 ENCOUNTER — Ambulatory Visit (HOSPITAL_COMMUNITY)
Admission: RE | Admit: 2021-06-03 | Discharge: 2021-06-03 | Disposition: A | Discharge status: Home / Self Care | Payer: Medicare Other | Source: Ambulatory Visit | Attending: Internal Medicine | Admitting: Internal Medicine

## 2021-06-03 VITALS — BP 148/98 | HR 85 | Wt 224.8 lb

## 2021-06-03 DIAGNOSIS — Z7951 Long term (current) use of inhaled steroids: Secondary | ICD-10-CM | POA: Diagnosis not present

## 2021-06-03 DIAGNOSIS — Z885 Allergy status to narcotic agent status: Secondary | ICD-10-CM | POA: Insufficient documentation

## 2021-06-03 DIAGNOSIS — Z881 Allergy status to other antibiotic agents status: Secondary | ICD-10-CM | POA: Insufficient documentation

## 2021-06-03 DIAGNOSIS — Z853 Personal history of malignant neoplasm of breast: Secondary | ICD-10-CM | POA: Insufficient documentation

## 2021-06-03 DIAGNOSIS — I447 Left bundle-branch block, unspecified: Secondary | ICD-10-CM | POA: Diagnosis not present

## 2021-06-03 DIAGNOSIS — I10 Essential (primary) hypertension: Secondary | ICD-10-CM

## 2021-06-03 DIAGNOSIS — Z8249 Family history of ischemic heart disease and other diseases of the circulatory system: Secondary | ICD-10-CM | POA: Diagnosis not present

## 2021-06-03 DIAGNOSIS — Z7984 Long term (current) use of oral hypoglycemic drugs: Secondary | ICD-10-CM | POA: Insufficient documentation

## 2021-06-03 DIAGNOSIS — Z7901 Long term (current) use of anticoagulants: Secondary | ICD-10-CM | POA: Diagnosis not present

## 2021-06-03 DIAGNOSIS — I5022 Chronic systolic (congestive) heart failure: Secondary | ICD-10-CM

## 2021-06-03 DIAGNOSIS — I110 Hypertensive heart disease with heart failure: Secondary | ICD-10-CM | POA: Insufficient documentation

## 2021-06-03 DIAGNOSIS — Z880 Allergy status to penicillin: Secondary | ICD-10-CM | POA: Insufficient documentation

## 2021-06-03 DIAGNOSIS — Z79899 Other long term (current) drug therapy: Secondary | ICD-10-CM | POA: Diagnosis not present

## 2021-06-03 DIAGNOSIS — Z86711 Personal history of pulmonary embolism: Secondary | ICD-10-CM | POA: Insufficient documentation

## 2021-06-03 DIAGNOSIS — Z634 Disappearance and death of family member: Secondary | ICD-10-CM | POA: Diagnosis not present

## 2021-06-03 DIAGNOSIS — Z86718 Personal history of other venous thrombosis and embolism: Secondary | ICD-10-CM | POA: Insufficient documentation

## 2021-06-03 DIAGNOSIS — Z882 Allergy status to sulfonamides status: Secondary | ICD-10-CM | POA: Insufficient documentation

## 2021-06-03 DIAGNOSIS — I428 Other cardiomyopathies: Secondary | ICD-10-CM | POA: Insufficient documentation

## 2021-06-03 NOTE — Patient Instructions (Addendum)
EKG was done today  You have been scheduled for a Stress Test you have been given the information for this test.  Your physician recommends that you schedule a follow-up appointment in: as scheduled for January 2023  At the [facility], you and your health needs are our priority. As part of our continuing mission to provide you with exceptional heart care, we have created designated Provider Care Teams. These Care Teams include your primary Cardiologist (physician) and Advanced Practice Providers (APPs- Physician Assistants and Nurse Practitioners) who all work together to provide you with the care you need, when you need it.   You may see any of the following providers on your designated Care Team at your next follow up: Dr Glori Bickers Dr Loralie Champagne Dr Patrice Paradise, NP Lyda Jester, [place] Ginnie Smart Audry Riles, PharmD   Please be sure to bring in all your medications bottles to every appointment.    If you have any questions or concerns before your next appointment please send [place] a message through [place] or call our office at [phone].    TO LEAVE A MESSAGE FOR THE NURSE SELECT OPTION 2, PLEASE LEAVE A MESSAGE INCLUDING: YOUR NAME DATE OF BIRTH CALL BACK NUMBER REASON FOR CALL**this is important as we prioritize the call backs  YOU WILL RECEIVE A CALL BACK THE SAME DAY AS LONG AS YOU CALL BEFORE 4:00 PM

## 2021-06-03 NOTE — Progress Notes (Signed)
ADVANCED HF CLINIC CONSULT NOTE  Referring Physician: Dr. Tamala Julian, Cardiology  Primary Care: Delsa Grana, PA-Cn Primary Cardiologist: Dr. Tamala Julian   Reason for Referral: Chronic Systolic Heart Failure   HPI:  65 y/o female, referred by Dr. Tamala Julian, for further evaluation of systolic heart failure. Also w/ h/o unprovoked DVT/PE, HTN, LBBB, bilateral carpal tunnel s/p bilateral carpal tunnel release, and breast cancer treated in 2011 w/ lumpectomy. No chemo nor radiation.   HF dates back to 2016 when she was diagnosed w/ PE. Echo at that time showed Biventricular dysfunction, LVEF 20%, RV  was moderately to severely dilated w/ severely reduced reduced systolic function. Placed on Xarelto for PE. Had f/u echo 3 months later, 12/16. LVEF mildly improved up to 35-40%. RV function back to normal. Underwent [facility] 10/2015 showing widely patent coronaries w/ 50% ostial narrowing of the LCx, felt 2/2 catheter-induced spasm. Had elevated PCWP 22 and EDP of 18. CO ok. CI 2.4. Placed on Entresto but did not tolerate due to orthostatic hypotension. Later switched to Losartan. She has been followed by gen cards and started on GDMT. On Losartan, Farxiga, spiro and Coreg. Had cMRI in 2018 EF 52% and showed no LGE.   Most recent echo 6/22 showed EF 20-25%. RV ok. Continues to struggle w/ NYHA Class II- III symptoms. Subsequently, referred to [facility] for further management.   Tearful today. Recently lost her husband several months ago. Still grieving. Reports subsequent wt loss. Endorses NYHA Class II-III symptoms. Report full med compliance. Has family h/o CHF. Paternal GM and 2 paternal aunts had CHF. Unsure about mother's side of family as her mother was adopted.   BP mildly elevated today 148/98 but reports better control at home. Attributes to being nervous about appt today. EKG shows SR 90 bpm w/ 1st degree AVB and LBBB, QRS 158 ms     Cardiac Studies  Echo 04/2015: EF 20%, RV moderately-severely dilated w/  severely reduced systolic function (in setting of acute saddle PE) Echo 08/2015: EF 35-40%, RV normal  Echo 01/2017: EF 30-35%, RV normal  Echo 02/2021: EF 25-30%, RV normal, mild MR   [facility] 10/2015:  There is moderate to severe left ventricular systolic dysfunction. Ost Cx lesion, 50% stenosed.   Widely patent coronary arteries. There is ostial 50% narrowing of the circumflex that I feel represents catheter-induced spasm. No high-grade obstruction is noted in any major or branch coronary vessel. Left ventricular systolic dysfunction with estimated ejection fraction in the 35% range. Elevated pulmonary capillary wedge pressure of 22 mmHg an EDP of 18 mmHg. Overall findings suggest nonischemic cardiomyopathy   Fick Cardiac Output 4.8 L/min  Fick Cardiac Output Index 2.35 (L/min)/BSA    cMRI 2018  IMPRESSION: 1.  Normal LV size with mild diffuse hypokinesis, EF 52%.   2.  Normal RV size and systolic function.   3. No myocardial LGE, so no definitive evidence for prior MI, infiltrative disease, or myocarditis.     Review of Systems: [y] = yes, [ ]  = no   General: Weight gain [ ] ; Weight loss [ ] ; Anorexia [ ] ; Fatigue [ ] ; Fever [ ] ; Chills [ ] ; Weakness [ ]   Cardiac: Chest pain/pressure [ ] ; Resting SOB [ ] ; Exertional SOB [Y ]; Orthopnea [ ] ; Pedal Edema [ ] ; Palpitations [ ] ; Syncope [ ] ; Presyncope [ ] ; Paroxysmal nocturnal dyspnea[ ]   Pulmonary: Cough [ ] ; Wheezing[ ] ; Hemoptysis[ ] ; Sputum [ ] ; Snoring [ ]   GI: Vomiting[ ] ; Dysphagia[ ] ; Melena[ ] ;  Hematochezia [ ] ; Heartburn[ ] ; Abdominal pain [ ] ; Constipation [ ] ; Diarrhea [ ] ; BRBPR [ ]   GU: Hematuria[ ] ; Dysuria [ ] ; Nocturia[ ]   Vascular: Pain in legs with walking [ ] ; Pain in feet with lying flat [ ] ; Non-healing sores [ ] ; Stroke [ ] ; TIA [ ] ; Slurred speech [ ] ;  Neuro: Headaches[ ] ; Vertigo[ ] ; Seizures[ ] ; Paresthesias[ ] ;Blurred vision [ ] ; Diplopia [ ] ; Vision changes [ ]   Ortho/Skin: Arthritis [ Y]; Joint pain [ ] ;  Muscle pain [ ] ; Joint swelling [ ] ; Back Pain [Y ]; Rash [ ]   Psych: Depression[ Y]; Anxiety[Y ]  Heme: Bleeding problems [ ] ; Clotting disorders [ ] ; Anemia [ ]   Endocrine: Diabetes [ ] ; Thyroid dysfunction[ ]    Past Medical History:  Diagnosis Date   Anxiety    Asthma    Blood transfusion 1979   Chidbirth   Breast cancer, left breast ([place]) 2011   DCIS   Chronic systolic CHF w/ Improved LVF 05/05/2015   NICM // Dx 04/2015 - EF initially 25-30% with RV dysfunction // f/u echo 08/2015 technically difficult, EF 35-40%, anterior, anteroseptal, apical and inferoapical severe hypokinesis suggestive of LAD territory ischemia/infarct, grade 1 DD, mild MR, RV normal. // Echo 5/18: mild LVH, EF 30-35, Gr 1 DD // cMRI 12/18: EF 52    Depression    DVT (deep venous thrombosis) ([place]) Aug. 2016   a. Dx 04/2015 - patient returned 05/2015 after noncompliance with Xarelto.   Environmental allergies    Essential hypertension    Family Hx of adverse reaction to anesthesia    " My Mother would get deathly sick "   Gastroesophageal reflux disease    Head injury 2011   Hemorrhoids    History of cardiac catheterization    Cath 10/2015: oLCx 50 (prob cath induced spasm), no sig CAD, EF 35-45   LBBB (left bundle branch block)    Migraine HAs 11/2014   Obesity    Orthostatic hypotension    Pneumonia 2012   Saddle pulmonary embolus ([place]) Aug. 2016   a. Dx 04/2015 - patient returned 05/2015 after noncompliance with Xarelto.   Shortness of breath dyspnea    Syncope    a. Recurrent syncope in 2016 felt 2/2 orthostasis in setting of PE.    Current Outpatient Medications  Medication Sig Dispense Refill   albuterol (VENTOLIN HFA) 108 (90 Base) MCG/ACT inhaler Inhale 2 puffs into the lungs every 6 (six) hours as needed for wheezing or shortness of breath. 18 g 2   carvedilol (COREG) 6.25 MG tablet TAKE 1 TABLET BY MOUTH TWICE A DAY 180 tablet 3   cetirizine (ZYRTEC) 10 MG tablet Take 10 mg by mouth daily.      chlorpheniramine (CHLOR-TRIMETON) 4 MG tablet Take 4 mg by mouth daily as needed for allergies.     dapagliflozin propanediol (FARXIGA) 10 MG TABS tablet Take 1 tablet (10 mg total) by mouth daily before breakfast. 30 tablet 11   fluticasone (FLONASE) 50 MCG/ACT nasal spray INSTILL 1 SPRAY INTO EACH NOSTRIL EVERY DAY AS NEEDED 48 mL 3   losartan (COZAAR) 25 MG tablet TAKE 1 TABLET (25 MG TOTAL) BY MOUTH DAILY. 90 tablet 3   rivaroxaban (XARELTO) 10 MG TABS tablet Take 1 tablet (10 mg total) by mouth daily. 30 tablet 5   spironolactone (ALDACTONE) 25 MG tablet Take 1 tablet (25 mg total) by mouth daily. 90 tablet 3   SYMBICORT 160-4.5 MCG/ACT inhaler INHALE 2  PUFFS INTO THE LUNGS 2 (TWO) TIMES DAILY. RINSE OUT MOUTH AND SPIT AFTER EACH USE 10.2 each 0   tiZANidine (ZANAFLEX) 2 MG tablet Take 1 tablet (2 mg total) by mouth 3 (three) times daily. TAKE 1 TABLET BY MOUTH EVERY 8 HOURS AS NEEDED FOR MUSCLE SPASM 180 tablet 3   vitamin B-12 (CYANOCOBALAMIN) 500 MCG tablet Take 2,500 mcg by mouth daily.     vitamin C (ASCORBIC ACID) 500 MG tablet Take 500 mg by mouth daily.     No current facility-administered medications for this encounter.    Allergies  Allergen Reactions   Gadolinium Derivatives Shortness Of Breath and Nausea And Vomiting   Levaquin [Levofloxacin In D5w] Itching    Was giving with vancomycin at same time - unsure which one pt had a reaction to   Sulfa Antibiotics Swelling    "Ears swelled up like Dumbo"   Vancomycin Itching    Was giving at same time as Levaquin - unsure which pt had reaction to   Aripiprazole     Dry mouth with sores    Codeine Itching and Rash   Oxycodone Itching   Penicillins Hives    Has patient had a PCN reaction causing immediate rash, facial/tongue/throat swelling, SOB or lightheadedness with hypotension: Yes Has patient had a PCN reaction causing severe rash involving mucus membranes or skin necrosis: No Has patient had a PCN reaction that required  hospitalization No Has patient had a PCN reaction occurring within the last 10 years: No If all of the above answers are "NO", then may proceed with Cephalosporin use.      Social History   Socioeconomic History   Marital status: Widowed    Spouse name: Not on file   Number of children: 2   Years of education: 68   Highest education level: Some college, no degree  Occupational History   Occupation: retired  Tobacco Use   Smoking status: Never   Smokeless tobacco: Never  Vaping Use   Vaping Use: Never used  Substance and Sexual Activity   Alcohol use: Not Currently    Comment: occasional wine   Drug use: No   Sexual activity: Yes  Other Topics Concern   Not on file  Social History Narrative   Independent at baseline. Boyfriend lives in [place] when she stays 3/4 of the time.    Social Determinants of Health   Financial Resource Strain: Low Risk    Difficulty of Paying Living Expenses: Not hard at all  Food Insecurity: No Food Insecurity   Worried About Charity fundraiser in the Last Year: Never true   [place] in the Last Year: Never true  Transportation Needs: No Transportation Needs   Lack of Transportation (Medical): No   Lack of Transportation (Non-Medical): No  Physical Activity: Insufficiently Active   Days of Exercise per Week: 3 days   Minutes of Exercise per Session: 20 min  Stress: No Stress Concern Present   Feeling of Stress : Not at all  Social Connections: Moderately Integrated   Frequency of Communication with Friends and Family: More than three times a week   Frequency of Social Gatherings with Friends and Family: More than three times a week   Attends Religious Services: More than 4 times per year   Active Member of [employer] or Organizations: Yes   Attends Music therapist: More than 4 times per year   Marital Status: Divorced  Human resources officer Violence: Not At  Risk   Fear of Current or Ex-Partner: No   Emotionally Abused: No    Physically Abused: No   Sexually Abused: No      Family History  Problem Relation Age of Onset   Breast cancer Paternal 66    Breast cancer Mother 95   Migraines Mother    Cancer Mother    Hypertension Mother    Rectal cancer Father    Cancer Father    COPD Father    Heart disease Paternal Grandmother    Stroke Paternal Grandmother    Heart attack Paternal Grandmother    Aneurysm Paternal Grandfather    COPD Paternal Grandfather    Breast cancer Cousin    Breast cancer Cousin    Breast cancer Cousin    Anesthesia problems Neg Hx    Diabetes Neg Hx     Vitals:   06/03/21 1457  BP: (!) 148/98  Pulse: 85  SpO2: 98%  Weight: 102 kg (224 lb 12.8 oz)    PHYSICAL EXAM: ReDs Clip 26%  General:  Well appearing, tearful. No respiratory difficulty HEENT: normal Neck: supple. no JVD. Carotids 2+ bilat; no bruits. No lymphadenopathy or thryomegaly appreciated. Cor: PMI nondisplaced. Regular rate & rhythm. No rubs, gallops or murmurs. Lungs: clear Abdomen: soft, nontender, nondistended. No hepatosplenomegaly. No bruits or masses. Good bowel sounds. Extremities: no cyanosis, clubbing, rash, edema Neuro: alert & oriented x 3, cranial nerves grossly intact. moves all 4 extremities w/o difficulty. Affect pleasant.  ECG: NSR 90 bpm, 1st degree AVB, Chronic LBBB QRS 158 ms    ASSESSMENT & PLAN:   Chronic Systolic Heart Failure  - NICM, dating back to 2016 - Echo 04/2015 EF 20%, RV severely reduced (in setting of PE) - Echo 08/2015: EF 35-40%, RV normal  - Echo 01/2017: EF 30-35%, RV normal  - Echo 02/2021: EF 25-30%, RV normal, mild MR  - LHC 10/2015: widely patent cors  - cMRI 2018: EF 52%. RV normal  No LGE  - Chronically NYHA Class II-III. Volume status ok on exam and by ReDs Clip 26%   - Does not need loop diuretic currently  - Continue Losartan 25 mg daily - Did not tolerate Entresto due to orthostatic hypotension  - Continue Farxiga 10 mg  - Continue Spironolactone 25  mg daily  - Continue Coreg 6.25 mg bid  - We discussed further testing w/ CPX. Given fairly stable symptoms, euvolemic volume status, and fact that she is still grieving over loss of husband, will wait to do CPX test in 3 months. Will f/u after CPX for further recs. -Continue current med regimen.  - given LBBB w/ QRS >150 ms may benefit from CRT-D. Will discuss again at f/u visit.   2. H/o DVT/PE - 2016, unprovoked - on lifelong a/c w/ Xarelto   3. HTN: - mildly elevated today 140s/90s, historically well controlled - tearful/ nervous today  - continue current regimen  - advised to monitor at home.   F/u in 3 months after CPX test   Lyda Jester, PA-C  Patient seen and examined with the above-signed Advanced Practice Provider and/or Housestaff. I personally reviewed laboratory data, imaging studies and relevant notes. I independently examined the patient and formulated the important aspects of the plan. I have edited the note to reflect any of my changes or salient points. I have personally discussed the plan with the patient and/or family.  65 y/o with longstanding systolic HF due to NICM. Recent EF down to 25-30%.  Stable NYHA II-III. Recently struggling with death of her husband. GDMT limited by low BP  General:  Well appearing. No resp difficulty HEENT: normal Neck: supple. no JVD. Carotids 2+ bilat; no bruits. No lymphadenopathy or thryomegaly appreciated. Cor: PMI nondisplaced. Regular rate & rhythm. No rubs, gallops or murmurs. Lungs: clear Abdomen: obese soft, nontender, nondistended. No hepatosplenomegaly. No bruits or masses. Good bowel sounds. Extremities: no cyanosis, clubbing, rash, edema Neuro: alert & orientedx3, cranial nerves grossly intact. moves all 4 extremities w/o difficulty. Affect pleasant but tearful.   Currently stable NYHA II-III. Given severe LV dysfunction and intolerance of GDMT titration will plan CPX testing to risk stratify for advanced therapies.  Given LBBB will consider CRT-D  Glori Bickers, MD  10:42 AM

## 2021-06-04 NOTE — Progress Notes (Signed)
Reds 39.5 in B 26%

## 2021-06-21 ENCOUNTER — Other Ambulatory Visit: Payer: Self-pay | Admitting: Family Medicine

## 2021-06-21 DIAGNOSIS — Z1231 Encounter for screening mammogram for malignant neoplasm of breast: Secondary | ICD-10-CM

## 2021-06-30 DIAGNOSIS — D0472 Carcinoma in situ of skin of left lower limb, including hip: Secondary | ICD-10-CM | POA: Diagnosis not present

## 2021-06-30 DIAGNOSIS — D0471 Carcinoma in situ of skin of right lower limb, including hip: Secondary | ICD-10-CM | POA: Diagnosis not present

## 2021-07-08 ENCOUNTER — Other Ambulatory Visit: Payer: Self-pay

## 2021-07-08 ENCOUNTER — Ambulatory Visit
Admission: RE | Admit: 2021-07-08 | Discharge: 2021-07-08 | Disposition: A | Discharge status: Home / Self Care | Payer: Medicare Other | Source: Ambulatory Visit | Attending: Family Medicine | Admitting: Family Medicine

## 2021-07-08 DIAGNOSIS — Z1231 Encounter for screening mammogram for malignant neoplasm of breast: Secondary | ICD-10-CM | POA: Diagnosis not present

## 2021-07-15 ENCOUNTER — Ambulatory Visit: Payer: Self-pay | Admitting: *Deleted

## 2021-07-15 NOTE — Telephone Encounter (Signed)
I called back into [facility] to see if the pt made an appt however Cassandra said she still refused the virtual appt when Cassandra called the pt back.    I asked her to make sure my notes are seen by Delsa Grana because I was worried about this pt due to her history.   She said my notes have gone to the clinical team to see.    I thanked Cassandra for her help.

## 2021-07-15 NOTE — Telephone Encounter (Signed)
Reason for Disposition  Patient sounds very sick or weak to the triager  Answer Assessment - Initial Assessment Questions 1. ONSET: "When did the cough begin?"      I have bronchitis.   I'm coughing a lot and nothing is coming up.   I have a nasty taste in my mouth. Yesterday the cough started.   Tuesday I was coughing a little but yesterday it got worse.   I thought it was allergies.   I've been in [place] the last 3 yrs.   I get bronchitis a lot and so I usually take prednisone and an antibiotic. When I blow my nose it's clear.   Covid is negative.  This cough is awful.    My O2 level is 80% and pulse is 83.   2. SEVERITY: "How bad is the cough today?"      It's bad 3. SPUTUM: "Describe the color of your sputum" (none, dry cough; clear, white, yellow, green)     Nothing being coughed up. 4. HEMOPTYSIS: "Are you coughing up any blood?" If so ask: "How much?" (flecks, streaks, tablespoons, etc.)      5. DIFFICULTY BREATHING: "Are you having difficulty breathing?" If Yes, ask: "How bad is it?" (e.g., mild, moderate, severe)    - MILD: No SOB at rest, mild SOB with walking, speaks normally in sentences, can lie down, no retractions, pulse < 100.    - MODERATE: SOB at rest, SOB with minimal exertion and prefers to sit, cannot lie down flat, speaks in phrases, mild retractions, audible wheezing, pulse 100-120.    - SEVERE: Very SOB at rest, speaks in single words, struggling to breathe, sitting hunched forward, retractions, pulse > 120      I've had bronchitis so many times.   My O2 is 85%.   My normal is 95-100%   I'm wheezing. 6. FEVER: "Do you have a fever?" If Yes, ask: "What is your temperature, how was it measured, and when did it start?"     No fever 7. CARDIAC HISTORY: "Do you have any history of heart disease?" (e.g., heart attack, congestive heart failure)      I have heart problems.   I've had blood clots.  The left side doesn't pump as well.   8. LUNG HISTORY: "Do you have any history  of lung disease?"  (e.g., pulmonary embolus, asthma, emphysema)     Bronchitis.  I get pneumonia. 9. PE RISK FACTORS: "Do you have a history of blood clots?" (or: recent major surgery, recent prolonged travel, bedridden)     Yes I've had blood clots in my lungs and my heart 10. OTHER SYMPTOMS: "Do you have any other symptoms?" (e.g., runny nose, wheezing, chest pain)       Wheezing, coughing a lot.  My chest is hurting with the coughing. 11. PREGNANCY: "Is there any chance you are pregnant?" "When was your last menstrual period?"       N/A due to age 66. TRAVEL: "Have you traveled out of the country in the last month?" (e.g., travel history, exposures)  Protocols used: Cough - Acute Non-Productive-A-AH

## 2021-07-15 NOTE — Telephone Encounter (Signed)
Spoke with pt and was trying to schedule her a virtual visit (our protocol). She declined the visit and hung up

## 2021-07-15 NOTE — Telephone Encounter (Signed)
Pt called in c/o having bronchitis with a lot of non productive coughing and chest pain with the coughing.   "I get bronchitis a lot and these are the symptoms I have".   "I have this bad taste in my mouth I always get when I have bronchitis".   See triage notes.  I called into [facility] to get an appt with one of the providers [facility] doesn't schedule for.   Kathrine Haddock had an opening.   I was talking with Cassandra and then connected the pt with a conference call.   There was a virtual appt available today but pt wanted to come in.  Cassandra explained the policy was to be seen virtually due to her symptoms because of Covid.   Pt said she tested with a home test and was negative but due to policy they still needed to do a virtual visit.   Pt started talking loudly that she tested negative for Covid "You mean I can't come in and be seen".   Cassandra explained the reason that her symptoms could still be related to having Covid even though there home test was negative.   Pt hung up.    Cassandra said she would call the pt back and see if she would take the virtual appt.    I thanked Dentist for helping.  I did not get a chance to go over any care advice except some suggestions for the coughing.     I sent my notes to [facility].

## 2021-07-30 ENCOUNTER — Telehealth: Payer: Medicare Other | Admitting: Nurse Practitioner

## 2021-07-30 ENCOUNTER — Ambulatory Visit: Payer: Self-pay

## 2021-07-30 DIAGNOSIS — N3000 Acute cystitis without hematuria: Secondary | ICD-10-CM

## 2021-07-30 NOTE — Progress Notes (Signed)
Based on what you shared with me, I feel your condition warrants further evaluation and I recommend that you be seen in a face to face visit.   Based on your age and history of antibiotic allergies this visit is outside of the scope of e-visits. We apologize for the inconvenience.    NOTE: There will be NO CHARGE for this eVisit   If you are having a true medical emergency please call 911.      For an urgent face to face visit, [facility] has six urgent care centers for your convenience:     [facility] [place] Get Driving Directions [phone] [address] [place], [place] [address]    [facility]) Get Driving Directions [phone] [place], [place] [address]  [facility] [place] [facility]) Get Driving Directions [phone] [address] [place],  [place]  [address]  [facility] Get Driving Directions [phone] [address] [place], [place], [place] [address]   [facility] Get Driving Directions  [phone] [address] [place], [place] [address]   [facility] [place] Get Driving Directions [phone] [address], [place], [place] [address]  Your MyChart E-visit questionnaire answers were reviewed by a board certified advanced clinical practitioner to complete your personal care plan based on your specific symptoms.  Thank you for using e-Visits.

## 2021-07-30 NOTE — Telephone Encounter (Signed)
Patient called in and says since Wednesday afternoon she's been having urinary frequency, lower abdominal pain around to lower back, odor with urination, burning/itching with urination. She says the pain is a 4-5 when standing still, worst when moving around. She denies fever. I advised to go on MyChart to do a virtual urgent care visit or e-visit, care advice given, patient verbalized understanding.   Summary: urinary discomfort   The patient has been experiencing urinary discomfort since 07/28/21   The patient has experienced discomfort, frequent urination, back pain and odorous urine   The patient would like to be prescribed something to help with their symptoms   Please contact further when possible      Reason for Disposition  Urinating more frequently than usual (i.e., frequency)  Answer Assessment - Initial Assessment Questions 1. SYMPTOM: "What's the main symptom you're concerned about?" (e.g., frequency, incontinence)     Frequency, urgency 2. ONSET: "When did the symptoms start?"     07/28/21 3. PAIN: "Is there any pain?" If Yes, ask: "How bad is it?" (Scale: 1-10; mild, moderate, severe)     4-5 4. CAUSE: "What do you think is causing the symptoms?"     UTI 5. OTHER SYMPTOMS: "Do you have any other symptoms?" (e.g., fever, flank pain, blood in urine, pain with urination)     Flank pain, itching, burning with urination, urine odor 6. PREGNANCY: "Is there any chance you are pregnant?" "When was your last menstrual period?"     No  Protocols used: Urinary Symptoms-A-AH

## 2021-08-02 ENCOUNTER — Other Ambulatory Visit: Payer: Self-pay | Admitting: Nurse Practitioner

## 2021-08-02 ENCOUNTER — Telehealth (INDEPENDENT_AMBULATORY_CARE_PROVIDER_SITE_OTHER): Payer: Medicare Other | Admitting: Nurse Practitioner

## 2021-08-02 ENCOUNTER — Other Ambulatory Visit: Payer: Self-pay

## 2021-08-02 ENCOUNTER — Encounter: Payer: Self-pay | Admitting: Nurse Practitioner

## 2021-08-02 DIAGNOSIS — J441 Chronic obstructive pulmonary disease with (acute) exacerbation: Secondary | ICD-10-CM | POA: Diagnosis not present

## 2021-08-02 DIAGNOSIS — R350 Frequency of micturition: Secondary | ICD-10-CM | POA: Diagnosis not present

## 2021-08-02 DIAGNOSIS — F4321 Adjustment disorder with depressed mood: Secondary | ICD-10-CM

## 2021-08-02 LAB — POCT URINALYSIS DIPSTICK
Bilirubin, UA: NEGATIVE
Glucose, UA: POSITIVE — AB
Ketones, UA: NEGATIVE
Nitrite, UA: NEGATIVE
Protein, UA: POSITIVE — AB
Spec Grav, UA: 1.02 (ref 1.010–1.025)
Urobilinogen, UA: 0.2 E.U./dL
pH, UA: 5 (ref 5.0–8.0)

## 2021-08-02 MED ORDER — DOXYCYCLINE HYCLATE 100 MG PO TABS
100.0000 mg | ORAL_TABLET | Freq: Two times a day (BID) | ORAL | 0 refills | Status: AC
Start: 2021-08-02 — End: 2021-08-07

## 2021-08-02 MED ORDER — TRELEGY ELLIPTA 100-62.5-25 MCG/ACT IN AEPB: 1.0000 | INHALATION_SPRAY | Freq: Every day | RESPIRATORY_TRACT | 0 refills | Status: DC

## 2021-08-02 MED ORDER — PREDNISONE 10 MG PO TABS: ORAL_TABLET | ORAL | 0 refills | Status: DC

## 2021-08-02 NOTE — Progress Notes (Signed)
Name: Patricia Fischer   MRN: [id number]    DOB: February 12, 1956   Date:08/02/2021       Progress Note  Subjective  Chief Complaint  Chief Complaint  Patient presents with   Cough    For 5 weeks   Urinary Tract Infection    Burning, itch, low back pain, mild abdominal pain    I connected with  [name]  on 08/02/21 at  1:00 PM EST by a video enabled telemedicine application and verified that I am speaking with the correct person using two identifiers.  I discussed the limitations of evaluation and management by telemedicine and the availability of in person appointments. The patient expressed understanding and agreed to proceed with a virtual visit  Staff also discussed with the patient that there may be a patient responsible charge related to this service. Patient Location: [facility] parking lot Provider Location: [facility] Additional Individuals present: alone  HPI  COPD exacerbation/ Cough:  She says she has had a cough for five weeks.  She says it just won't go away.  She denies any fever or chills.  She says she just keeps coughing.  She is also wheezing.  She also reports shortness of breath with exertion.  She is currently using her albuterol inhaler two to three times a day.  She is also on Symbicort.  Discussed switching to a triple therapy.  She is going to stop taking the Symbicort at this time.  She will use Trelegy as a replacement at this time.  Will also give her a course of steroids and antibiotics.    Urinary frequency:  She says she started having urinary frequency yesterday.  She denies any dysuria, fever, or flank pain.  She provided urine sample and will send off for culture.  Encouraged her to keep drinking water.  She is on [place].  Grief:  She is currently in counseling for grief.  Her husband died five months ago and she is having a hard time with the holidays.  She is not interested in medication at this time.   Depression screen [facility] 2/9 08/02/2021 05/03/2021  02/16/2021 11/19/2020 01/22/2020  Decreased Interest 1 0 0 0 0  Down, Depressed, Hopeless 1 0 0 0 0  PHQ - 2 Score 2 0 0 0 0  Altered sleeping 1 - 0 - 0  Tired, decreased energy 1 - 0 - 0  Change in appetite 1 - 0 - 0  Feeling bad or failure about yourself  1 - 0 - 0  Trouble concentrating 1 - 0 - 0  Moving slowly or fidgety/restless 1 - 0 - 0  Suicidal thoughts 0 - 0 - 0  PHQ-9 Score 8 - 0 - 0  Difficult doing work/chores Somewhat difficult - Not difficult at all - Not difficult at all  Some recent data might be hidden     Patient Active Problem List   Diagnosis Date Noted   Chronic obstructive pulmonary disease ([place]) 01/22/2020   Stress incontinence 12/27/2019   Hyperlipidemia 12/27/2019   Muscle spasms of lower extremity 12/27/2019   Class 3 severe obesity with body mass index (BMI) of 40.0 to 44.9 in adult ([place]) 07/12/2017   Allergic rhinitis 06/26/2017   Hematuria 07/27/2016   Degenerative joint disease (DJD) of hip 03/31/2016   Hx of breast cancer 03/22/2016   HTN (hypertension) 10/07/2015   Depression, major, recurrent, moderate ([place]) 06/11/2015   Anemia 05/15/2015   Do not resuscitate status 05/15/2015  Chronic systolic CHF (congestive heart failure) (HCC) 05/05/2015   Left leg DVT ([place]) 05/05/2015   Pulmonary hypertension (HCC)    Saddle pulmonary embolus (HCC)    LBBB (left bundle branch block)-rate related  04/19/2015   Chronic migraine without aura, with intractable migraine, so stated, with status migrainosus 11/04/2014   Subscapularis (muscle) sprain 11/22/2013    Social History   Tobacco Use   Smoking status: Never   Smokeless tobacco: Never  Substance Use Topics   Alcohol use: Not Currently    Comment: occasional wine     Current Outpatient Medications:    albuterol (VENTOLIN HFA) 108 (90 Base) MCG/ACT inhaler, Inhale 2 puffs into the lungs every 6 (six) hours as needed for wheezing or shortness of breath., Disp: 18 g, Rfl: 2   carvedilol (COREG)  6.25 MG tablet, TAKE 1 TABLET BY MOUTH TWICE A DAY, Disp: 180 tablet, Rfl: 3   cetirizine (ZYRTEC) 10 MG tablet, Take 10 mg by mouth daily., Disp: , Rfl:    chlorpheniramine (CHLOR-TRIMETON) 4 MG tablet, Take 4 mg by mouth daily as needed for allergies., Disp: , Rfl:    dapagliflozin propanediol (FARXIGA) 10 MG TABS tablet, Take 1 tablet (10 mg total) by mouth daily before breakfast., Disp: 30 tablet, Rfl: 11   fluticasone (FLONASE) 50 MCG/ACT nasal spray, INSTILL 1 SPRAY INTO EACH NOSTRIL EVERY DAY AS NEEDED, Disp: 48 mL, Rfl: 3   losartan (COZAAR) 25 MG tablet, TAKE 1 TABLET (25 MG TOTAL) BY MOUTH DAILY., Disp: 90 tablet, Rfl: 3   rivaroxaban (XARELTO) 10 MG TABS tablet, Take 1 tablet (10 mg total) by mouth daily., Disp: 30 tablet, Rfl: 5   spironolactone (ALDACTONE) 25 MG tablet, Take 1 tablet (25 mg total) by mouth daily., Disp: 90 tablet, Rfl: 3   SYMBICORT 160-4.5 MCG/ACT inhaler, INHALE 2 PUFFS INTO THE LUNGS 2 (TWO) TIMES DAILY. RINSE OUT MOUTH AND SPIT AFTER EACH USE, Disp: 10.2 each, Rfl: 0   tiZANidine (ZANAFLEX) 2 MG tablet, Take 1 tablet (2 mg total) by mouth 3 (three) times daily. TAKE 1 TABLET BY MOUTH EVERY 8 HOURS AS NEEDED FOR MUSCLE SPASM, Disp: 180 tablet, Rfl: 3   vitamin B-12 (CYANOCOBALAMIN) 500 MCG tablet, Take 2,500 mcg by mouth daily., Disp: , Rfl:    vitamin C (ASCORBIC ACID) 500 MG tablet, Take 500 mg by mouth daily., Disp: , Rfl:   Allergies  Allergen Reactions   Gadolinium Derivatives Shortness Of Breath and Nausea And Vomiting   Levaquin [Levofloxacin In D5w] Itching    Was giving with vancomycin at same time - unsure which one pt had a reaction to   Sulfa Antibiotics Swelling    "Ears swelled up like [place]"   Vancomycin Itching    Was giving at same time as Levaquin - unsure which pt had reaction to   Aripiprazole     Dry mouth with sores    Codeine Itching and Rash   Oxycodone Itching   Penicillins Hives    Has patient had a PCN reaction causing immediate  rash, facial/tongue/throat swelling, SOB or lightheadedness with hypotension: Yes Has patient had a PCN reaction causing severe rash involving mucus membranes or skin necrosis: No Has patient had a PCN reaction that required hospitalization No Has patient had a PCN reaction occurring within the last 10 years: No If all of the above answers are "NO", then may proceed with Cephalosporin use.    I personally reviewed active problem list, medication list, allergies with the patient/caregiver  today.  ROS  Constitutional: Negative for fever or weight change.  Respiratory: Positive for cough and shortness of breath, and wheezing Cardiovascular: Negative for chest pain or palpitations.  Gastrointestinal: Negative for abdominal pain, no bowel changes Genitourinary: Positive for urinary frequency, negative for dysuria  Musculoskeletal: Negative for gait problem or joint swelling.  Skin: Negative for rash.  Neurological: Negative for dizziness or headache.  No other specific complaints in a complete review of systems (except as listed in HPI above).   Objective  Virtual encounter, vitals not obtained.  There is no height or weight on file to calculate BMI.  Nursing Note and Vital Signs reviewed.  Physical Exam  Awake, alert and oriented, speaking in complete sentences Lung sounds: scattered wheezing  Results for orders placed or performed in visit on 08/02/21 (from the past 72 hour(s))  POCT urinalysis dipstick     Status: Abnormal   Collection Time: 08/02/21  1:16 PM  Result Value Ref Range   Color, UA gold    Clarity, UA cloudy    Glucose, UA Positive (A) Negative    Comment: 2000+   Bilirubin, UA negative    Ketones, UA negative    Spec Grav, UA 1.020 1.010 - 1.025   Blood, UA trace    pH, UA 5.0 5.0 - 8.0   Protein, UA Positive (A) Negative   Urobilinogen, UA 0.2 0.2 or 1.0 E.U./dL   Nitrite, UA negative    Leukocytes, UA Trace (A) Negative   Appearance cloudy    Odor  none     Assessment & Plan  1. Chronic obstructive pulmonary disease with acute exacerbation (HCC) -stop using Symbicort while using Trelegy.   -continue using albuterol  - Fluticasone-Umeclidin-Vilant (TRELEGY ELLIPTA) 100-62.5-25 MCG/ACT AEPB; Inhale 1 puff into the lungs daily.  Dispense: 1 each; Refill: 0 - doxycycline (VIBRA-TABS) 100 MG tablet; Take 1 tablet (100 mg total) by mouth 2 (two) times daily for 5 days.  Dispense: 10 tablet; Refill: 0 - predniSONE (DELTASONE) 10 MG tablet; Day 1 take 6 pills, day 2 take 5 pills, day 3 take 4 pills, day 4 take 3 pills, day 5 take 2 pills, day 6 take a pill  Dispense: 21 tablet; Refill: 0  2. Urine frequency  - POCT urinalysis dipstick - Urine Culture  3. Grief -continue with counseling  -Red flags and when to present for emergency care or RTC including fever >101.4F, chest pain, shortness of breath, new/worsening/un-resolving symptoms,  reviewed with patient at time of visit. Follow up and care instructions discussed and provided in AVS. - I discussed the assessment and treatment plan with the patient. The patient was provided an opportunity to ask questions and all were answered. The patient agreed with the plan and demonstrated an understanding of the instructions.  I provided 25 minutes of non-face-to-face time during this encounter.  Bo Merino, FNP

## 2021-08-02 NOTE — Telephone Encounter (Signed)
Pt has a virtual today

## 2021-08-03 LAB — URINE CULTURE
MICRO NUMBER:: 12684320
Result:: NO GROWTH
SPECIMEN QUALITY:: ADEQUATE

## 2021-08-09 NOTE — Progress Notes (Signed)
Cardiology Office Note:    Date:  08/10/2021   ID:  [name], DOB 02-02-1956, MRN [id number]  PCP:  Delsa Grana, PA-C   [facility] Providers Cardiologist:  Sinclair Grooms, MD     Referring MD: Delsa Grana, PA-C   Chief Complaint:  F/u for CHF    Patient Profile:   Patricia Fischer is a 65 y.o. female with:  (HFrEF) heart failure with reduced ejection fraction  Non-ischemic cardiomyopathy Echocardiogram 12/16: EF 35-40 Echocardiogram 5/18: EF 30-35, Gr 1 DD cMRI 12/18: EF 52 Echocardiogram 6/22: EF 25-30 Intol of Entresto (ortho hypotension) Hx of recurrent pulmonary embolism  On chronic anticoagulation - Rx w/ Rivaroxaban Secondary pulmonary hypertension  Breast CA Hypertension  Left Bundle Branch Block Hx of DVT and pulmonary embolism in 2016 COPD   History of Present Illness: Patricia Fischer had worsening heart failure symptoms in June 2022.  Follow-up echocardiogram demonstrated that her ejection fraction was again reduced at 25-30%.  She was placed on dapagliflozin in addition to her other heart failure medications.  She was referred to the heart failure clinic and was seen by Dr. Haroldine Laws in September.   Plan is to obtain cardiopulmonary stress testing to rule stratify for advanced therapies.  She may be a candidate for CRT-D given LBBB.  She returns for follow-up.  She is here alone.  She remains quite upset about her husband's death earlier this year.  She is not sleeping very well.  She remains tired.  She sees a grief counselor every other week.  She has chronic shortness of breath with exertion.  This is unchanged.  She has not had chest pain, syncope, orthopnea.  She had a recent viral illness.  She continues with a cough.  She was recently placed on prednisone and doxycycline by primary care.  She also had her inhaled medication adjusted to Trelegy.  ASSESSMENT & PLAN:   HFrEF (heart failure with reduced ejection fraction) (HCC) Nonischemic  cardiomyopathy.  NYHA IIb.  Volume status currently stable.  She could not tolerate Entresto in the past due to orthostatic hypotension.  Her blood pressure will not currently tolerate further titration of GDMT.  Continue spironolactone 25 mg daily, losartan 25 mg daily, dapagliflozin 10 mg daily, carvedilol 6.25 mg twice daily.  She had a cardiopulmonary stress test later today and follow-up with Dr. Haroldine Laws in the heart failure clinic in January to discuss +/- advanced therapies.  We will see her back in general cardiology in 6 months.  History of pulmonary embolism Continue rivaroxaban 10 mg daily.          Dispo:  Return in about 6 months (around 02/08/2022) for Routine Follow Up with Dr. Tamala Julian, or Richardson Dopp, PA-C.    Prior CV studies: Echocardiogram 02/12/2021 EF 25-30, global HK, mild concentric LVH, GRII DD, normal RVSF, mild LAE, mild MR, mild AV sclerosis without stenosis  Cardiac MRI 08/21/17 EF 52; no LGE   Echocardiogram 01/09/17 Mild LVH, EF 30-35, diff HK, Gr 1 DD, trace MR/TR   Cardiac catheterization 10/28/15 LCx ost 50 (prob cath spasm) EF 35-45   Carotid US 10/07/15 No sig ICA stenosis   Echocardiogram 08/25/15 EF 35-40, ant/ant-sept/apical/inf-apical HK, Gr 1 DD, mild MR, RA/LA upper limits of NL     Past Medical History:  Diagnosis Date   Anxiety    Asthma    Blood transfusion 1979   Chidbirth   Breast cancer, left breast ([place]) 2011   DCIS  Chronic systolic CHF w/ Improved LVF 05/05/2015   NICM // Dx 04/2015 - EF initially 25-30% with RV dysfunction // f/u echo 08/2015 technically difficult, EF 35-40%, anterior, anteroseptal, apical and inferoapical severe hypokinesis suggestive of LAD territory ischemia/infarct, grade 1 DD, mild MR, RV normal. // Echo 5/18: mild LVH, EF 30-35, Gr 1 DD // cMRI 12/18: EF 52    Depression    DVT (deep venous thrombosis) ([place]) Aug. 2016   a. Dx 04/2015 - patient returned 05/2015 after noncompliance with Xarelto.   Environmental  allergies    Essential hypertension    Family Hx of adverse reaction to anesthesia    " My Mother would get deathly sick "   Gastroesophageal reflux disease    Head injury 2011   Hemorrhoids    History of cardiac catheterization    Cath 10/2015: oLCx 50 (prob cath induced spasm), no sig CAD, EF 35-45   LBBB (left bundle branch block)    Migraine HAs 11/2014   Obesity    Orthostatic hypotension    Pneumonia 2012   Saddle pulmonary embolus ([place]) Aug. 2016   a. Dx 04/2015 - patient returned 05/2015 after noncompliance with Xarelto.   Shortness of breath dyspnea    Syncope    a. Recurrent syncope in 2016 felt 2/2 orthostasis in setting of PE.   Current Medications: Current Meds  Medication Sig   albuterol (VENTOLIN HFA) 108 (90 Base) MCG/ACT inhaler Inhale 2 puffs into the lungs every 6 (six) hours as needed for wheezing or shortness of breath.   carvedilol (COREG) 6.25 MG tablet TAKE 1 TABLET BY MOUTH TWICE A DAY   cetirizine (ZYRTEC) 10 MG tablet Take 10 mg by mouth daily.   dapagliflozin propanediol (FARXIGA) 10 MG TABS tablet Take 1 tablet (10 mg total) by mouth daily before breakfast.   fluticasone (FLONASE) 50 MCG/ACT nasal spray INSTILL 1 SPRAY INTO EACH NOSTRIL EVERY DAY AS NEEDED   Fluticasone-Umeclidin-Vilant (TRELEGY ELLIPTA) 100-62.5-25 MCG/ACT AEPB Inhale 1 puff into the lungs daily.   losartan (COZAAR) 25 MG tablet TAKE 1 TABLET (25 MG TOTAL) BY MOUTH DAILY.   rivaroxaban (XARELTO) 10 MG TABS tablet Take 1 tablet (10 mg total) by mouth daily.   spironolactone (ALDACTONE) 25 MG tablet Take 1 tablet (25 mg total) by mouth daily.   tiZANidine (ZANAFLEX) 2 MG tablet Take 1 tablet (2 mg total) by mouth 3 (three) times daily. TAKE 1 TABLET BY MOUTH EVERY 8 HOURS AS NEEDED FOR MUSCLE SPASM   vitamin B-12 (CYANOCOBALAMIN) 500 MCG tablet Take 2,500 mcg by mouth daily.   vitamin C (ASCORBIC ACID) 500 MG tablet Take 500 mg by mouth daily.    Allergies:   Gadolinium derivatives,  Levaquin [levofloxacin in d5w], Sulfa antibiotics, Vancomycin, Aripiprazole, Codeine, Oxycodone, and Penicillins   Social History   Tobacco Use   Smoking status: Never   Smokeless tobacco: Never  Vaping Use   Vaping Use: Never used  Substance Use Topics   Alcohol use: Not Currently    Comment: occasional wine   Drug use: No    Family Hx: The patient's family history includes Aneurysm in her paternal grandfather; Breast cancer in her cousin, cousin, cousin, and paternal aunt; Breast cancer (age of onset: 58) in her mother; COPD in her father and paternal grandfather; Cancer in her father and mother; Heart attack in her paternal grandmother; Heart disease in her paternal grandmother; Hypertension in her mother; Migraines in her mother; Rectal cancer in her father; Stroke in  her paternal grandmother. There is no history of Anesthesia problems or Diabetes.  Review of Systems  Psychiatric/Behavioral:  Positive for depression (grief).   see HPI  EKGs/Labs/Other Test Reviewed:    EKG:  EKG is not ordered today.  The ekg ordered today demonstrates n/a  Recent Labs: 02/11/2021: Hemoglobin 12.6; NT-Pro BNP 1,112; Platelets 208 03/30/2021: BUN 11; Creatinine, Ser 0.79; Potassium 4.2; Sodium 144   Recent Lipid Panel Lab Results  Component Value Date/Time   CHOL 193 09/30/2019 08:10 AM   TRIG 152 (H) 09/30/2019 08:10 AM   HDL 53 09/30/2019 08:10 AM   LDLCALC 113 (H) 09/30/2019 08:10 AM   LDLCALC 104 (H) 08/08/2018 09:54 AM     Risk Assessment/Calculations:          Physical Exam:    VS:  BP 100/72 (BP Location: Left Arm, Patient Position: Sitting, Cuff Size: Normal)   Pulse 80   Ht 5\' 4"  (1.626 m)   Wt 225 lb 6.4 oz (102.2 kg)   SpO2 96%   BMI 38.69 kg/m     Wt Readings from Last 3 Encounters:  08/10/21 225 lb 6.4 oz (102.2 kg)  06/03/21 224 lb 12.8 oz (102 kg)  03/05/21 232 lb 6.4 oz (105.4 kg)    Constitutional:      Appearance: Healthy appearance. Not in distress.   Neck:     Vascular: No JVR. JVD normal.  Pulmonary:     Effort: Pulmonary effort is normal.     Breath sounds: Wheezing (faint insp wheezes) present. No rales.  Cardiovascular:     Normal rate. Regular rhythm. Normal S1. Normal S2.      Murmurs: There is no murmur.  Edema:    Peripheral edema absent.  Abdominal:     Palpations: Abdomen is soft.  Skin:    General: Skin is warm and dry.  Neurological:     Mental Status: Alert and oriented to person, place and time.     Cranial Nerves: Cranial nerves are intact.  Psychiatric:     Comments: tearful       Medication Adjustments/Labs and Tests Ordered: Current medicines are reviewed at length with the patient today.  Concerns regarding medicines are outlined above.  Tests Ordered: No orders of the defined types were placed in this encounter.  Medication Changes: No orders of the defined types were placed in this encounter.  Signed, Richardson Dopp, PA-C  08/10/2021 5:36 PM    [facility] [address], [place], [place]  [address] Phone: [phone]; Fax: [phone]

## 2021-08-10 ENCOUNTER — Encounter: Payer: Self-pay | Admitting: Physician Assistant

## 2021-08-10 ENCOUNTER — Ambulatory Visit (HOSPITAL_COMMUNITY): Payer: Medicare Other | Attending: Internal Medicine

## 2021-08-10 ENCOUNTER — Other Ambulatory Visit: Payer: Self-pay

## 2021-08-10 ENCOUNTER — Ambulatory Visit: Payer: Medicare Other | Admitting: Physician Assistant

## 2021-08-10 VITALS — BP 100/72 | HR 80 | Ht 64.0 in | Wt 225.4 lb

## 2021-08-10 DIAGNOSIS — I5020 Unspecified systolic (congestive) heart failure: Secondary | ICD-10-CM | POA: Diagnosis not present

## 2021-08-10 DIAGNOSIS — I447 Left bundle-branch block, unspecified: Secondary | ICD-10-CM

## 2021-08-10 DIAGNOSIS — I5022 Chronic systolic (congestive) heart failure: Secondary | ICD-10-CM | POA: Insufficient documentation

## 2021-08-10 DIAGNOSIS — Z86711 Personal history of pulmonary embolism: Secondary | ICD-10-CM | POA: Diagnosis not present

## 2021-08-10 DIAGNOSIS — I10 Essential (primary) hypertension: Secondary | ICD-10-CM

## 2021-08-10 NOTE — Assessment & Plan Note (Signed)
Continue rivaroxaban 10 mg daily.

## 2021-08-10 NOTE — Assessment & Plan Note (Addendum)
Nonischemic cardiomyopathy.  NYHA IIb.  Volume status currently stable.  She could not tolerate Entresto in the past due to orthostatic hypotension.  Her blood pressure will not currently tolerate further titration of GDMT.  Continue spironolactone 25 mg daily, losartan 25 mg daily, dapagliflozin 10 mg daily, carvedilol 6.25 mg twice daily.  She had a cardiopulmonary stress test later today and follow-up with Dr. Haroldine Laws in the heart failure clinic in January to discuss +/- advanced therapies.  We will see her back in general cardiology in 6 months.

## 2021-08-10 NOTE — Patient Instructions (Signed)
Medication Instructions:   Your physician recommends that you continue on your current medications as directed. Please refer to the Current Medication list given to you today.  *If you need a refill on your cardiac medications before your next appointment, please call your pharmacy*   Lab Work:  -NONE  If you have labs (blood work) drawn today and your tests are completely normal, you will receive your results only by: [place] (if you have MyChart) OR A paper copy in the mail If you have any lab test that is abnormal or we need to change your treatment, we will call you to review the results.   Testing/Procedures:  -NONE   Follow-Up: At [facility], you and your health needs are our priority.  As part of our continuing mission to provide you with exceptional heart care, we have created designated Provider Care Teams.  These Care Teams include your primary Cardiologist (physician) and Advanced Practice Providers (APPs -  Physician Assistants and Nurse Practitioners) who all work together to provide you with the care you need, when you need it.  We recommend signing up for the patient portal called "MyChart".  Sign up information is provided on this After Visit Summary.  MyChart is used to connect with patients for Virtual Visits (Telemedicine).  Patients are able to view lab/test results, encounter notes, upcoming appointments, etc.  Non-urgent messages can be sent to your provider as well.   To learn more about what you can do with MyChart, go to [email or web address].    Your next appointment:   6 month(s)  The format for your next appointment:   In Person  Provider:   Sinclair Grooms, MD     Other Instructions

## 2021-08-24 DIAGNOSIS — H2513 Age-related nuclear cataract, bilateral: Secondary | ICD-10-CM | POA: Diagnosis not present

## 2021-09-06 NOTE — Progress Notes (Addendum)
ADVANCED HF CLINIC NOTE  Referring Physician: Dr. Tamala Julian, Cardiology  Primary Care: Delsa Grana, PA-Cn Primary Cardiologist: Dr. Tamala Julian   Reason for Referral: Chronic Systolic Heart Failure   HPI:  Ms Lenoard Aden is a 66 y/o female with PMHX of unprovoked DVT/PE, HTN, LBBB, bilateral carpal tunnel s/p bilateral carpal tunnel release, and breast cancer treated in 2011 w/ lumpectomy. No chemo nor radiation.  Referred by Dr. Tamala Julian to [facility] in 9/22 for further evaluation of systolic heart failure.   HF dates back to 2016 when she was diagnosed w/ PE. Echo at that time showed Biventricular dysfunction, LVEF 20%, RV  was moderately to severely dilated w/ severely reduced reduced systolic function. Placed on Xarelto for PE. Had f/u echo 3 months later, 12/16. LVEF 35-40%. RV function back to normal.   Underwent [facility] 2/17 showing widely patent coronaries w/ 50% ostial narrowing of the LCx, felt 2/2 catheter-induced spasm. Had elevated PCWP 22 and EDP of 18. CO ok. CI 2.4.   Placed on Entresto but did not tolerate due to orthostatic hypotension. Later switched to Losartan.   cMRI in 2018 EF 52% and showed no LGE.   Most recent echo 6/22 EF 20-25%. RV ok. Referred to [facility] for further management.   We saw her in 9/22 for first time. At that time very upset about recent loss of her husband and not interested in discussing further options. Did undergo CPX 12/22 which showed mild limitation due to her obesity and corresponding restrictive lung physiology.   Here for f/u with her daughter, Janett Billow. Says she is doing ok. Able to do ADLs without too much problem.If she tries to do more gets SOB and has some chest burning. No edema, orthopnea or PND. Compliant with meds.    CPX 08/10/21  FVC 1.58 (53%)      FEV1 1.31 (56%)        FEV1/FVC 83 (104%)        MVV 52 (58%)   Resting HR: 78 Standing HR: 78 Peak HR: 128   (83% age predicted max HR)  BP rest: 108/68 Standing BP: 102/68 BP peak: 132/66    Peak VO2: 14.7 (93% predicted peak VO2) iBW corrected 23.7ml/kg/min VE/VCO2 slope:  33  Peak RER: 0.97  Ve/MVV: 86%   Cardiac Studies  Echo 04/2015: EF 20%, RV moderately-severely dilated w/ severely reduced systolic function (in setting of acute saddle PE) Echo 08/2015: EF 35-40%, RV normal  Echo 01/2017: EF 30-35%, RV normal  Echo 02/2021: EF 25-30%, RV normal, mild MR   [facility] 10/2015:  There is moderate to severe left ventricular systolic dysfunction. Ost Cx lesion, 50% stenosed.   Widely patent coronary arteries. There is ostial 50% narrowing of the circumflex that I feel represents catheter-induced spasm. No high-grade obstruction is noted in any major or branch coronary vessel. Left ventricular systolic dysfunction with estimated ejection fraction in the 35% range. Elevated pulmonary capillary wedge pressure of 22 mmHg an EDP of 18 mmHg. Overall findings suggest nonischemic cardiomyopathy   Fick Cardiac Output 4.8 L/min  Fick Cardiac Output Index 2.35 (L/min)/BSA    cMRI 2018  IMPRESSION: 1.  Normal LV size with mild diffuse hypokinesis, EF 52%.   2.  Normal RV size and systolic function.   3. No myocardial LGE, so no definitive evidence for prior MI, infiltrative disease, or myocarditis.     Past Medical History:  Diagnosis Date   Anxiety    Asthma    Blood transfusion 1979  Chidbirth   Breast cancer, left breast ([place]) 2011   DCIS   Chronic systolic CHF w/ Improved LVF 05/05/2015   NICM // Dx 04/2015 - EF initially 25-30% with RV dysfunction // f/u echo 08/2015 technically difficult, EF 35-40%, anterior, anteroseptal, apical and inferoapical severe hypokinesis suggestive of LAD territory ischemia/infarct, grade 1 DD, mild MR, RV normal. // Echo 5/18: mild LVH, EF 30-35, Gr 1 DD // cMRI 12/18: EF 52    Depression    DVT (deep venous thrombosis) ([place]) Aug. 2016   a. Dx 04/2015 - patient returned 05/2015 after noncompliance with Xarelto.   Environmental allergies     Essential hypertension    Family Hx of adverse reaction to anesthesia    " My Mother would get deathly sick "   Gastroesophageal reflux disease    Head injury 2011   Hemorrhoids    History of cardiac catheterization    Cath 10/2015: oLCx 50 (prob cath induced spasm), no sig CAD, EF 35-45   LBBB (left bundle branch block)    Migraine HAs 11/2014   Obesity    Orthostatic hypotension    Pneumonia 2012   Saddle pulmonary embolus ([place]) Aug. 2016   a. Dx 04/2015 - patient returned 05/2015 after noncompliance with Xarelto.   Shortness of breath dyspnea    Syncope    a. Recurrent syncope in 2016 felt 2/2 orthostasis in setting of PE.    Current Outpatient Medications  Medication Sig Dispense Refill   albuterol (VENTOLIN HFA) 108 (90 Base) MCG/ACT inhaler Inhale 2 puffs into the lungs every 6 (six) hours as needed for wheezing or shortness of breath. 18 g 2   carvedilol (COREG) 6.25 MG tablet TAKE 1 TABLET BY MOUTH TWICE A DAY 180 tablet 3   cetirizine (ZYRTEC) 10 MG tablet Take 10 mg by mouth daily.     dapagliflozin propanediol (FARXIGA) 10 MG TABS tablet Take 1 tablet (10 mg total) by mouth daily before breakfast. 30 tablet 11   fluticasone (FLONASE) 50 MCG/ACT nasal spray INSTILL 1 SPRAY INTO EACH NOSTRIL EVERY DAY AS NEEDED 48 mL 3   Fluticasone-Umeclidin-Vilant (TRELEGY ELLIPTA) 100-62.5-25 MCG/ACT AEPB Inhale 1 puff into the lungs daily. 1 each 0   losartan (COZAAR) 25 MG tablet TAKE 1 TABLET (25 MG TOTAL) BY MOUTH DAILY. 90 tablet 3   rivaroxaban (XARELTO) 10 MG TABS tablet Take 1 tablet (10 mg total) by mouth daily. 30 tablet 5   spironolactone (ALDACTONE) 25 MG tablet Take 1 tablet (25 mg total) by mouth daily. 90 tablet 3   tizanidine (ZANAFLEX) 2 MG capsule Take 2 mg by mouth 3 (three) times daily as needed for muscle spasms.     vitamin B-12 (CYANOCOBALAMIN) 500 MCG tablet Take 2,500 mcg by mouth daily.     vitamin C (ASCORBIC ACID) 500 MG tablet Take 500 mg by mouth daily.     No  current facility-administered medications for this encounter.    Allergies  Allergen Reactions   Gadolinium Derivatives Shortness Of Breath and Nausea And Vomiting   Levaquin [Levofloxacin In D5w] Itching    Was giving with vancomycin at same time - unsure which one pt had a reaction to   Sulfa Antibiotics Swelling    "Ears swelled up like Dumbo"   Vancomycin Itching    Was giving at same time as Levaquin - unsure which pt had reaction to   Aripiprazole     Dry mouth with sores    Codeine Itching and Rash  Oxycodone Itching   Penicillins Hives    Has patient had a PCN reaction causing immediate rash, facial/tongue/throat swelling, SOB or lightheadedness with hypotension: Yes Has patient had a PCN reaction causing severe rash involving mucus membranes or skin necrosis: No Has patient had a PCN reaction that required hospitalization No Has patient had a PCN reaction occurring within the last 10 years: No If all of the above answers are "NO", then may proceed with Cephalosporin use.      Social History   Socioeconomic History   Marital status: Widowed    Spouse name: Not on file   Number of children: 2   Years of education: 27   Highest education level: Some college, no degree  Occupational History   Occupation: retired  Tobacco Use   Smoking status: Never   Smokeless tobacco: Never  Vaping Use   Vaping Use: Never used  Substance and Sexual Activity   Alcohol use: Not Currently    Comment: occasional wine   Drug use: No   Sexual activity: Yes  Other Topics Concern   Not on file  Social History Narrative   Independent at baseline. Boyfriend lives in [place] when she stays 3/4 of the time.    Social Determinants of Health   Financial Resource Strain: Low Risk    Difficulty of Paying Living Expenses: Not hard at all  Food Insecurity: No Food Insecurity   Worried About Charity fundraiser in the Last Year: Never true   [place] in the Last Year: Never true   Transportation Needs: No Transportation Needs   Lack of Transportation (Medical): No   Lack of Transportation (Non-Medical): No  Physical Activity: Insufficiently Active   Days of Exercise per Week: 3 days   Minutes of Exercise per Session: 20 min  Stress: No Stress Concern Present   Feeling of Stress : Not at all  Social Connections: Moderately Integrated   Frequency of Communication with Friends and Family: More than three times a week   Frequency of Social Gatherings with Friends and Family: More than three times a week   Attends Religious Services: More than 4 times per year   Active Member of [employer] or Organizations: Yes   Attends Music therapist: More than 4 times per year   Marital Status: Divorced  Human resources officer Violence: Not At Risk   Fear of Current or Ex-Partner: No   Emotionally Abused: No   Physically Abused: No   Sexually Abused: No      Family History  Problem Relation Age of Onset   Breast cancer Paternal 74    Breast cancer Mother 49   Migraines Mother    Cancer Mother    Hypertension Mother    Rectal cancer Father    Cancer Father    COPD Father    Heart disease Paternal Grandmother    Stroke Paternal Grandmother    Heart attack Paternal Grandmother    Aneurysm Paternal Grandfather    COPD Paternal Grandfather    Breast cancer Cousin    Breast cancer Cousin    Breast cancer Cousin    Anesthesia problems Neg Hx    Diabetes Neg Hx     Vitals:   09/07/21 1504  BP: 118/80  Pulse: 84  SpO2: 94%  Weight: 102.9 kg (226 lb 12.8 oz)     PHYSICAL EXAM: General:  Well appearing. No resp difficulty HEENT: normal Neck: supple. no JVD. Carotids 2+ bilat; no bruits. No  lymphadenopathy or thryomegaly appreciated. Cor: PMI nondisplaced. Regular rate & rhythm. No rubs, gallops or murmurs. Lungs: clear Abdomen: soft, nontender, nondistended. No hepatosplenomegaly. No bruits or masses. Good bowel sounds. Extremities: no cyanosis,  clubbing, rash, edema Neuro: alert & orientedx3, cranial nerves grossly intact. moves all 4 extremities w/o difficulty. Affect pleasant   ECG: NSR 90 bpm, 1st degree AVB, Chronic LBBB QRS 158 ms    ASSESSMENT & PLAN:   Chronic Systolic Heart Failure  - NICM, dating back to 2016 - Echo 04/2015 EF 20%, RV severely reduced (in setting of PE) - Echo 08/2015: EF 35-40%, RV normal  - LHC 10/2015: widely patent cors  - Echo 01/2017: EF 30-35%, RV normal  - cMRI 2018: EF 52%. RV normal  No LGE  - Echo 02/2021: EF 25-30%, RV normal, mild MR  - CPX 12/22: pVO2: 14.7 (93% predicted peak VO2) iBW corrected 23.39ml/kg/min, slope 33, pRER: 0.97. Mild limitation due to obesity and restrictive lung physiology. Elevated VE/VCO2 slope suggests mild HF component - Remains NYHA Class III. However CPX reassuring from HF perspective and suggests primary limitation is not cardiac  - Volume status ok Does not need loop diuretic currently  - Continue Losartan 25 mg daily - Did not tolerate Entresto due to orthostatic hypotension  - Continue Farxiga 10 mg  - Continue Spironolactone 25 mg daily  - Continue Coreg 6.25 mg bid  - Stressed need for weight loss and attempts at gradual increase in activity level  - If EF remains <= 35% consider ICD. Given LBBB w/ QRS >150 ms may benefit from CRT-D. Repeat echo at next visit. - Labs today  2. H/o DVT/PE - 2016, unprovoked - on lifelong a/c w/ Xarelto  - No bleeding   3. HTN: - Blood pressure well controlled. Continue current regimen.   Glori Bickers, MD  3:28 PM

## 2021-09-07 ENCOUNTER — Encounter (HOSPITAL_COMMUNITY): Payer: Self-pay | Admitting: Internal Medicine

## 2021-09-07 ENCOUNTER — Other Ambulatory Visit: Payer: Self-pay

## 2021-09-07 ENCOUNTER — Ambulatory Visit (HOSPITAL_COMMUNITY)
Admission: RE | Admit: 2021-09-07 | Discharge: 2021-09-07 | Disposition: A | Discharge status: Home / Self Care | Payer: Medicare Other | Source: Ambulatory Visit | Attending: Internal Medicine | Admitting: Internal Medicine

## 2021-09-07 VITALS — BP 118/80 | HR 84 | Wt 226.8 lb

## 2021-09-07 DIAGNOSIS — I5022 Chronic systolic (congestive) heart failure: Secondary | ICD-10-CM

## 2021-09-07 DIAGNOSIS — I272 Pulmonary hypertension, unspecified: Secondary | ICD-10-CM | POA: Diagnosis not present

## 2021-09-07 DIAGNOSIS — Z79899 Other long term (current) drug therapy: Secondary | ICD-10-CM | POA: Diagnosis not present

## 2021-09-07 DIAGNOSIS — I110 Hypertensive heart disease with heart failure: Secondary | ICD-10-CM | POA: Diagnosis present

## 2021-09-07 DIAGNOSIS — E669 Obesity, unspecified: Secondary | ICD-10-CM | POA: Insufficient documentation

## 2021-09-07 DIAGNOSIS — I951 Orthostatic hypotension: Secondary | ICD-10-CM | POA: Diagnosis not present

## 2021-09-07 DIAGNOSIS — R0600 Dyspnea, unspecified: Secondary | ICD-10-CM | POA: Insufficient documentation

## 2021-09-07 DIAGNOSIS — I447 Left bundle-branch block, unspecified: Secondary | ICD-10-CM | POA: Diagnosis not present

## 2021-09-07 DIAGNOSIS — Z7182 Exercise counseling: Secondary | ICD-10-CM | POA: Diagnosis not present

## 2021-09-07 DIAGNOSIS — Z7901 Long term (current) use of anticoagulants: Secondary | ICD-10-CM | POA: Insufficient documentation

## 2021-09-07 DIAGNOSIS — Z86718 Personal history of other venous thrombosis and embolism: Secondary | ICD-10-CM | POA: Diagnosis not present

## 2021-09-07 DIAGNOSIS — I440 Atrioventricular block, first degree: Secondary | ICD-10-CM | POA: Insufficient documentation

## 2021-09-07 DIAGNOSIS — Z86711 Personal history of pulmonary embolism: Secondary | ICD-10-CM | POA: Diagnosis not present

## 2021-09-07 DIAGNOSIS — I428 Other cardiomyopathies: Secondary | ICD-10-CM | POA: Diagnosis not present

## 2021-09-07 DIAGNOSIS — Z7984 Long term (current) use of oral hypoglycemic drugs: Secondary | ICD-10-CM | POA: Diagnosis not present

## 2021-09-07 DIAGNOSIS — Z853 Personal history of malignant neoplasm of breast: Secondary | ICD-10-CM | POA: Insufficient documentation

## 2021-09-07 DIAGNOSIS — Z634 Disappearance and death of family member: Secondary | ICD-10-CM | POA: Insufficient documentation

## 2021-09-07 LAB — BASIC METABOLIC PANEL
Anion gap: 6 (ref 5–15)
BUN: 9 mg/dL (ref 8–23)
CO2: 29 mmol/L (ref 22–32)
Calcium: 9.3 mg/dL (ref 8.9–10.3)
Chloride: 102 mmol/L (ref 98–111)
Creatinine, Ser: 0.76 mg/dL (ref 0.44–1.00)
GFR, Estimated: >60 mL/min (ref >60)
Glucose, Bld: 90 mg/dL (ref 70–99)
Potassium: 3.9 mmol/L (ref 3.5–5.1)
Sodium: 137 mmol/L (ref 135–145)

## 2021-09-07 LAB — BRAIN NATRIURETIC PEPTIDE: B Natriuretic Peptide: 140.4 pg/mL — ABNORMAL HIGH (ref 0.0–100.0)

## 2021-09-07 NOTE — Patient Instructions (Signed)
Medication Changes:  No change  Lab Work:  Labs done today, your results will be available in MyChart, we will contact you for abnormal readings.   Testing/Procedures:  Your physician has requested that you have an echocardiogram. Echocardiography is a painless test that uses sound waves to create images of your heart. It provides your doctor with information about the size and shape of your heart and how well your hearts chambers and valves are working. This procedure takes approximately one hour. There are no restrictions for this procedure.   Referrals:  none  Special Instructions // Education:  none  Follow-Up in: 4-6 months ( April-June) ** Call in March for appointmemt  At the [facility], you and your health needs are our priority. We have a designated team specialized in the treatment of Heart Failure. This Care Team includes your primary Heart Failure Specialized Cardiologist (physician), Advanced Practice Providers (APPs- Physician Assistants and Nurse Practitioners), and Pharmacist who all work together to provide you with the care you need, when you need it.   You may see any of the following providers on your designated Care Team at your next follow up:  Dr Glori Bickers Dr Haynes Kerns, NP Lyda Jester, [place] [facility] [place], [place] Audry Riles, PharmD   Please be sure to bring in all your medications bottles to every appointment.   Need to Contact us:  If you have any questions or concerns before your next appointment please send [place] a message through [place] or call our office at [phone].    TO LEAVE A MESSAGE FOR THE NURSE SELECT OPTION 2, PLEASE LEAVE A MESSAGE INCLUDING: YOUR NAME DATE OF BIRTH CALL BACK NUMBER REASON FOR CALL**this is important as we prioritize the call backs  YOU WILL RECEIVE A CALL BACK THE SAME DAY AS LONG AS YOU CALL BEFORE 4:00 PM

## 2021-09-14 ENCOUNTER — Other Ambulatory Visit: Payer: Self-pay | Admitting: Nurse Practitioner

## 2021-09-14 DIAGNOSIS — J441 Chronic obstructive pulmonary disease with (acute) exacerbation: Secondary | ICD-10-CM

## 2021-09-14 NOTE — Telephone Encounter (Signed)
Requested medication (s) are due for refill today: yes  Requested medication (s) are on the active medication list: yes  Last refill:  08/02/21  Future visit scheduled: no  Notes to clinic:  Unable to refill per protocol, medication not assigned to the refill protocol.      Requested Prescriptions  Pending Prescriptions Disp [place] 100-62.5-25 MCG/ACT AEPB [Pharmacy Med Name: TRELEGY ELLIPTA 100-62.5-25] 60 each     Sig: TAKE 1 PUFF BY MOUTH EVERY DAY     There is no refill protocol information for this order

## 2021-10-01 ENCOUNTER — Encounter: Payer: Self-pay | Admitting: Emergency Medicine

## 2021-10-01 ENCOUNTER — Emergency Department: Payer: Medicare Other

## 2021-10-01 ENCOUNTER — Other Ambulatory Visit: Payer: Self-pay

## 2021-10-01 ENCOUNTER — Emergency Department
Admission: EM | Admit: 2021-10-01 | Discharge: 2021-10-01 | Disposition: A | Discharge status: Home / Self Care | Payer: Medicare Other | Attending: Emergency Medicine | Admitting: Emergency Medicine

## 2021-10-01 DIAGNOSIS — Z79899 Other long term (current) drug therapy: Secondary | ICD-10-CM | POA: Diagnosis not present

## 2021-10-01 DIAGNOSIS — Y92410 Unspecified street and highway as the place of occurrence of the external cause: Secondary | ICD-10-CM | POA: Insufficient documentation

## 2021-10-01 DIAGNOSIS — S20211A Contusion of right front wall of thorax, initial encounter: Secondary | ICD-10-CM | POA: Diagnosis not present

## 2021-10-01 DIAGNOSIS — J449 Chronic obstructive pulmonary disease, unspecified: Secondary | ICD-10-CM | POA: Diagnosis not present

## 2021-10-01 DIAGNOSIS — I10 Essential (primary) hypertension: Secondary | ICD-10-CM | POA: Insufficient documentation

## 2021-10-01 DIAGNOSIS — Z7901 Long term (current) use of anticoagulants: Secondary | ICD-10-CM | POA: Diagnosis not present

## 2021-10-01 DIAGNOSIS — Z853 Personal history of malignant neoplasm of breast: Secondary | ICD-10-CM | POA: Diagnosis not present

## 2021-10-01 DIAGNOSIS — S299XXA Unspecified injury of thorax, initial encounter: Secondary | ICD-10-CM | POA: Diagnosis present

## 2021-10-01 MED ORDER — ACETAMINOPHEN 500 MG PO TABS
1000.0000 mg | ORAL_TABLET | Freq: Once | ORAL | Status: AC
  Administered 2021-10-01: 1000 mg via ORAL
  Filled 2021-10-01: qty 2
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
## 2021-10-01 NOTE — Discharge Instructions (Signed)
Please apply ice to the right side of your chest 20 minutes every hour.  You may take Tylenol 1000 mg every 6 hours as needed.  Return to the ER for any shortness of breath, worsening symptoms or urgent changes in health
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
## 2021-10-09 ENCOUNTER — Other Ambulatory Visit: Payer: Self-pay | Admitting: Interventional Cardiology

## 2021-10-11 ENCOUNTER — Other Ambulatory Visit: Payer: Self-pay

## 2021-10-11 DIAGNOSIS — J441 Chronic obstructive pulmonary disease with (acute) exacerbation: Secondary | ICD-10-CM

## 2021-10-11 MED ORDER — ALBUTEROL SULFATE HFA 108 (90 BASE) MCG/ACT IN AERS: 2.0000 | INHALATION_SPRAY | Freq: Four times a day (QID) | RESPIRATORY_TRACT | 2 refills | Status: DC | PRN

## 2021-10-11 NOTE — Telephone Encounter (Signed)
Pt last saw Dr Haroldine Laws 09/07/21, last labs 09/07/21 Creat 0.76, age 66, weight 106.6kg, pt is on 10mg  Xarelto for prevention of recurrent VTE.  Will refill rx.

## 2021-10-13 ENCOUNTER — Other Ambulatory Visit (HOSPITAL_COMMUNITY)
Admission: RE | Admit: 2021-10-13 | Discharge: 2021-10-13 | Disposition: A | Discharge status: Home / Self Care | Payer: Medicare Other | Source: Ambulatory Visit | Attending: Internal Medicine | Admitting: Internal Medicine

## 2021-10-13 ENCOUNTER — Encounter: Payer: Self-pay | Admitting: Internal Medicine

## 2021-10-13 ENCOUNTER — Telehealth (INDEPENDENT_AMBULATORY_CARE_PROVIDER_SITE_OTHER): Payer: Medicare Other | Admitting: Internal Medicine

## 2021-10-13 VITALS — Resp 16 | Ht 65.0 in | Wt 235.0 lb

## 2021-10-13 DIAGNOSIS — N760 Acute vaginitis: Secondary | ICD-10-CM | POA: Diagnosis not present

## 2021-10-13 DIAGNOSIS — B3731 Acute candidiasis of vulva and vagina: Secondary | ICD-10-CM

## 2021-10-13 DIAGNOSIS — N898 Other specified noninflammatory disorders of vagina: Secondary | ICD-10-CM

## 2021-10-13 DIAGNOSIS — B9689 Other specified bacterial agents as the cause of diseases classified elsewhere: Secondary | ICD-10-CM

## 2021-10-13 DIAGNOSIS — R300 Dysuria: Secondary | ICD-10-CM | POA: Insufficient documentation

## 2021-10-13 LAB — POCT URINALYSIS DIPSTICK
Bilirubin, UA: NEGATIVE
Blood, UA: NEGATIVE
Glucose, UA: POSITIVE — AB
Ketones, UA: NEGATIVE
Leukocytes, UA: NEGATIVE
Nitrite, UA: NEGATIVE
Odor: NORMAL
Protein, UA: NEGATIVE
Spec Grav, UA: 1.02 (ref 1.010–1.025)
Urobilinogen, UA: 0.2 E.U./dL
pH, UA: 6.5 (ref 5.0–8.0)
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
## 2021-10-13 NOTE — Addendum Note (Signed)
Addended by: Docia Furl on: 10/13/2021 10:17 AM   Modules accepted: Orders

## 2021-10-14 LAB — CERVICOVAGINAL ANCILLARY ONLY
Bacterial Vaginitis (gardnerella): POSITIVE — AB
Candida Glabrata: NEGATIVE
Candida Vaginitis: POSITIVE — AB
Comment: NEGATIVE
Comment: NEGATIVE
Comment: NEGATIVE

## 2021-10-14 LAB — URINE CULTURE
MICRO NUMBER:: 12981714
SPECIMEN QUALITY:: ADEQUATE

## 2021-10-14 MED ORDER — FLUCONAZOLE 150 MG PO TABS: 150.0000 mg | ORAL_TABLET | Freq: Once | ORAL | 0 refills | Status: AC

## 2021-10-14 MED ORDER — METRONIDAZOLE 500 MG PO TABS: 500.0000 mg | ORAL_TABLET | Freq: Two times a day (BID) | ORAL | 0 refills | Status: AC

## 2021-10-14 NOTE — Addendum Note (Signed)
Addended by: [name] on: 10/14/2021 01:45 PM   Modules accepted: Orders

## 2021-10-15 ENCOUNTER — Other Ambulatory Visit: Payer: Self-pay | Admitting: Unknown Physician Specialty

## 2021-10-15 NOTE — Telephone Encounter (Signed)
Requested medications are due for refill today.  unsure  Requested medications are on the active medications list.  yes  Last refill. unknown  Future visit scheduled.   yes  Notes to clinic.  Medication not delegated. Listed as historical medication.    Requested Prescriptions  Pending Prescriptions Disp Refills   tiZANidine (ZANAFLEX) 2 MG tablet [Pharmacy Med Name: TIZANIDINE HCL 2 MG TABLET] 270 tablet 2    Sig: TAKE 1 TABLET BY MOUTH 3 TIMES A DAY EVERY 8 HOURS AS NEEDED FOR MUSCLE SPASMS     Not Delegated - Cardiovascular:  Alpha-2 Agonists - tizanidine Failed - 10/15/2021  1:39 AM      Failed - This refill cannot be delegated      Passed - Valid encounter within last 6 months    Recent Outpatient Visits           2 days ago Burning with urination   [facility] [name], DO   2 months ago Chronic obstructive pulmonary disease with acute exacerbation [facility])   [facility] Bo Merino, FNP   5 months ago COVID-19   [facility] Bo Merino, FNP   8 months ago Need for pneumococcal vaccine   [facility] Kathrine Haddock, NP   1 year ago Chronic obstructive pulmonary disease with acute exacerbation [facility])   [facility] Delsa Grana, PA-C       Future Appointments             In 1 month  [facility], [place]   In 3 months Tamala Julian, Lynnell Dike, MD [facility]

## 2021-10-18 ENCOUNTER — Ambulatory Visit (HOSPITAL_COMMUNITY)
Admission: RE | Admit: 2021-10-18 | Discharge: 2021-10-18 | Disposition: A | Discharge status: Home / Self Care | Payer: Medicare Other | Source: Ambulatory Visit | Attending: Family Medicine | Admitting: Family Medicine

## 2021-10-18 ENCOUNTER — Other Ambulatory Visit: Payer: Self-pay

## 2021-10-18 DIAGNOSIS — I5022 Chronic systolic (congestive) heart failure: Secondary | ICD-10-CM | POA: Diagnosis not present

## 2021-10-18 DIAGNOSIS — I447 Left bundle-branch block, unspecified: Secondary | ICD-10-CM | POA: Insufficient documentation

## 2021-10-18 DIAGNOSIS — I509 Heart failure, unspecified: Secondary | ICD-10-CM | POA: Diagnosis not present

## 2021-10-18 DIAGNOSIS — I110 Hypertensive heart disease with heart failure: Secondary | ICD-10-CM | POA: Insufficient documentation

## 2021-10-18 DIAGNOSIS — I77819 Aortic ectasia, unspecified site: Secondary | ICD-10-CM | POA: Diagnosis not present

## 2021-10-18 DIAGNOSIS — R0602 Shortness of breath: Secondary | ICD-10-CM | POA: Insufficient documentation

## 2021-10-18 DIAGNOSIS — I272 Pulmonary hypertension, unspecified: Secondary | ICD-10-CM | POA: Diagnosis not present

## 2021-10-18 DIAGNOSIS — R0600 Dyspnea, unspecified: Secondary | ICD-10-CM | POA: Diagnosis not present

## 2021-10-18 LAB — ECHOCARDIOGRAM COMPLETE
Area-P 1/2: 4.6 cm2
Calc EF: 32.5 %
S' Lateral: 4.8 cm
Single Plane A2C EF: 29.2 %
Single Plane A4C EF: 37.4 %

## 2021-10-18 NOTE — Progress Notes (Signed)
°  Echocardiogram 2D Echocardiogram has been performed.  Patricia Fischer 10/18/2021, 3:51 PM

## 2021-10-20 ENCOUNTER — Encounter: Payer: Self-pay | Admitting: Family Medicine

## 2021-10-22 ENCOUNTER — Ambulatory Visit: Payer: Medicare Other | Admitting: Internal Medicine

## 2021-11-17 ENCOUNTER — Other Ambulatory Visit: Payer: Self-pay | Admitting: Family Medicine

## 2021-11-17 DIAGNOSIS — J441 Chronic obstructive pulmonary disease with (acute) exacerbation: Secondary | ICD-10-CM

## 2021-11-17 NOTE — Telephone Encounter (Signed)
Requested Prescriptions  ?Pending Prescriptions Disp Refills  ?? TRELEGY ELLIPTA 100-62.5-25 MCG/ACT AEPB [Pharmacy Med Name: TRELEGY ELLIPTA 100-62.5-25] 60 each 1  ?  Sig: INHALE 1 PUFF BY MOUTH EVERY DAY  ?  ? There is no refill protocol information for this order  ?  ? ?

## 2021-11-23 ENCOUNTER — Ambulatory Visit (INDEPENDENT_AMBULATORY_CARE_PROVIDER_SITE_OTHER): Payer: Medicare Other

## 2021-11-23 DIAGNOSIS — Z0000 Encounter for general adult medical examination without abnormal findings: Secondary | ICD-10-CM | POA: Diagnosis not present

## 2021-11-23 NOTE — Patient Instructions (Signed)
Ms. Kruczek , ?Thank you for taking time to come for your Medicare Wellness Visit. I appreciate your ongoing commitment to your health goals. Please review the following plan we discussed and let me know if I can assist you in the future.  ? ?Screening recommendations/referrals: ?Colonoscopy: done 2010; please discuss with your PCP at your next visit ?Mammogram: done 07/08/21 ?Bone Density: Please call [phone] to schedule your bone density screening.  ?Recommended yearly ophthalmology/optometry visit for glaucoma screening and checkup ?Recommended yearly dental visit for hygiene and checkup ? ?Vaccinations: ?Influenza vaccine: due ?Pneumococcal vaccine: done 02/16/21 ?Tdap vaccine: done 05/17/16 ?Shingles vaccine: Shingrix discussed. Please contact your pharmacy for coverage information.  ?Covid-19: declined ? ?Advanced directives: Advance directive discussed with you today. Even though you declined this today please call our office should you change your mind and we can give you the proper paperwork for you to fill out.  ? ?Conditions/risks identified: Keep up the great work! ? ?Next appointment: Follow up in one year for your annual wellness visit  ? ? ?Preventive Care 75 Years and Older, Female ?Preventive care refers to lifestyle choices and visits with your health care provider that can promote health and wellness. ?What does preventive care include? ?A yearly physical exam. This is also called an annual well check. ?Dental exams once or twice a year. ?Routine eye exams. Ask your health care provider how often you should have your eyes checked. ?Personal lifestyle choices, including: ?Daily care of your teeth and gums. ?Regular physical activity. ?Eating a healthy diet. ?Avoiding tobacco and drug use. ?Limiting alcohol use. ?Practicing safe sex. ?Taking low-dose aspirin every day. ?Taking vitamin and mineral supplements as recommended by your health care provider. ?What happens during an annual well  check? ?The services and screenings done by your health care provider during your annual well check will depend on your age, overall health, lifestyle risk factors, and family history of disease. ?Counseling  ?Your health care provider may ask you questions about your: ?Alcohol use. ?Tobacco use. ?Drug use. ?Emotional well-being. ?Home and relationship well-being. ?Sexual activity. ?Eating habits. ?History of falls. ?Memory and ability to understand (cognition). ?Work and work Statistician. ?Reproductive health. ?Screening  ?You may have the following tests or measurements: ?Height, weight, and BMI. ?Blood pressure. ?Lipid and cholesterol levels. These may be checked every 5 years, or more frequently if you are over 35 years old. ?Skin check. ?Lung cancer screening. You may have this screening every year starting at age 99 if you have a 30-pack-year history of smoking and currently smoke or have quit within the past 15 years. ?Fecal occult blood test (FOBT) of the stool. You may have this test every year starting at age 58. ?Flexible sigmoidoscopy or colonoscopy. You may have a sigmoidoscopy every 5 years or a colonoscopy every 10 years starting at age 80. ?Hepatitis C blood test. ?Hepatitis B blood test. ?Sexually transmitted disease (STD) testing. ?Diabetes screening. This is done by checking your blood sugar (glucose) after you have not eaten for a while (fasting). You may have this done every 1-3 years. ?Bone density scan. This is done to screen for osteoporosis. You may have this done starting at age 76. ?Mammogram. This may be done every 1-2 years. Talk to your health care provider about how often you should have regular mammograms. ?Talk with your health care provider about your test results, treatment options, and if necessary, the need for more tests. ?Vaccines  ?Your health care provider may recommend certain vaccines, such as: ?  Influenza vaccine. This is recommended every year. ?Tetanus, diphtheria, and  acellular pertussis (Tdap, Td) vaccine. You may need a Td booster every 10 years. ?Zoster vaccine. You may need this after age 37. ?Pneumococcal 13-valent conjugate (PCV13) vaccine. One dose is recommended after age 54. ?Pneumococcal polysaccharide (PPSV23) vaccine. One dose is recommended after age 84. ?Talk to your health care provider about which screenings and vaccines you need and how often you need them. ?This information is not intended to replace advice given to you by your health care provider. Make sure you discuss any questions you have with your health care provider. ?Document Released: 09/18/2015 Document Revised: 05/11/2016 Document Reviewed: 06/23/2015 ?Elsevier Interactive Patient Education ? 2017 [place]. ? ?Fall Prevention in the Home ?Falls can cause injuries. They can happen to people of all ages. There are many things you can do to make your home safe and to help prevent falls. ?What can I do on the outside of my home? ?Regularly fix the edges of walkways and driveways and fix any cracks. ?Remove anything that might make you trip as you walk through a door, such as a raised step or threshold. ?Trim any bushes or trees on the path to your home. ?Use bright outdoor lighting. ?Clear any walking paths of anything that might make someone trip, such as rocks or tools. ?Regularly check to see if handrails are loose or broken. Make sure that both sides of any steps have handrails. ?Any raised decks and porches should have guardrails on the edges. ?Have any leaves, snow, or ice cleared regularly. ?Use sand or salt on walking paths during winter. ?Clean up any spills in your garage right away. This includes oil or grease spills. ?What can I do in the bathroom? ?Use night lights. ?Install grab bars by the toilet and in the tub and shower. Do not use towel bars as grab bars. ?Use non-skid mats or decals in the tub or shower. ?If you need to sit down in the shower, use a plastic, non-slip stool. ?Keep  the floor dry. Clean up any water that spills on the floor as soon as it happens. ?Remove soap buildup in the tub or shower regularly. ?Attach bath mats securely with double-sided non-slip rug tape. ?Do not have throw rugs and other things on the floor that can make you trip. ?What can I do in the bedroom? ?Use night lights. ?Make sure that you have a light by your bed that is easy to reach. ?Do not use any sheets or blankets that are too big for your bed. They should not hang down onto the floor. ?Have a firm chair that has side arms. You can use this for support while you get dressed. ?Do not have throw rugs and other things on the floor that can make you trip. ?What can I do in the kitchen? ?Clean up any spills right away. ?Avoid walking on wet floors. ?Keep items that you use a lot in easy-to-reach places. ?If you need to reach something above you, use a strong step stool that has a grab bar. ?Keep electrical cords out of the way. ?Do not use floor polish or wax that makes floors slippery. If you must use wax, use non-skid floor wax. ?Do not have throw rugs and other things on the floor that can make you trip. ?What can I do with my stairs? ?Do not leave any items on the stairs. ?Make sure that there are handrails on both sides of the stairs and use them.  Fix handrails that are broken or loose. Make sure that handrails are as long as the stairways. ?Check any carpeting to make sure that it is firmly attached to the stairs. Fix any carpet that is loose or worn. ?Avoid having throw rugs at the top or bottom of the stairs. If you do have throw rugs, attach them to the floor with carpet tape. ?Make sure that you have a light switch at the top of the stairs and the bottom of the stairs. If you do not have them, ask someone to add them for you. ?What else can I do to help prevent falls? ?Wear shoes that: ?Do not have high heels. ?Have rubber bottoms. ?Are comfortable and fit you well. ?Are closed at the toe. Do not  wear sandals. ?If you use a stepladder: ?Make sure that it is fully opened. Do not climb a closed stepladder. ?Make sure that both sides of the stepladder are locked into place. ?Ask someone to hold it for you, if

## 2021-11-23 NOTE — Progress Notes (Signed)
? ?Subjective:  ? Patricia Fischer is a 66 y.o. female who presents for Medicare Annual (Subsequent) preventive examination. ? ?Virtual Visit via Telephone Note ? ?I connected with  Patricia Fischer on 11/23/21 at  3:30 PM EDT by telephone and verified that I am speaking with the correct person using two identifiers. ? ?Location: ?Patient: home ?Provider: [place] ?Persons participating in the virtual visit: patient/Nurse Health Advisor ?  ?I discussed the limitations, risks, security and privacy concerns of performing an evaluation and management service by telephone and the availability of in person appointments. The patient expressed understanding and agreed to proceed. ? ?Interactive audio and video telecommunications were attempted between this nurse and patient, however failed, due to patient having technical difficulties OR patient did not have access to video capability.  We continued and completed visit with audio only. ? ?Some vital signs may be absent or patient reported.  ? ?Patricia Marker, LPN ? ? ?Review of Systems    ? ?Cardiac Risk Factors include: advanced age (>15mn, >>84women);hypertension;obesity (BMI >30kg/m2) ? ?   ?Objective:  ?  ?There were no vitals filed for this visit. ?There is no height or weight on file to calculate BMI. ? ?Advanced Directives 11/23/2021 10/01/2021 11/19/2020 09/07/2017 08/10/2017 06/26/2017 11/25/2016  ?Does Patient Have a Medical Advance Directive? No No No No No No No  ?Type of Advance Directive - - - - - - -  ?Copy of [place] Chart? - - - - - - -  ?Would patient like information on creating a medical advance directive? No - Patient declined - No - Patient declined No - Patient declined No - Patient declined - -  ?Pre-existing out of facility DNR order (yellow form or pink MOST form) - - - - - - -  ? ? ?Current Medications (verified) ?Outpatient Encounter Medications as of 11/23/2021  ?Medication Sig  ? albuterol (VENTOLIN HFA) 108 (90 Base) MCG/ACT  inhaler Inhale 2 puffs into the lungs every 6 (six) hours as needed for wheezing or shortness of breath.  ? carvedilol (COREG) 6.25 MG tablet TAKE 1 TABLET BY MOUTH TWICE A DAY  ? cetirizine (ZYRTEC) 10 MG tablet Take 10 mg by mouth daily.  ? dapagliflozin propanediol (FARXIGA) 10 MG TABS tablet Take 1 tablet (10 mg total) by mouth daily before breakfast.  ? fluticasone (FLONASE) 50 MCG/ACT nasal spray INSTILL 1 SPRAY INTO EACH NOSTRIL EVERY DAY AS NEEDED  ? losartan (COZAAR) 25 MG tablet TAKE 1 TABLET (25 MG TOTAL) BY MOUTH DAILY.  ? spironolactone (ALDACTONE) 25 MG tablet Take 1 tablet (25 mg total) by mouth daily.  ? tizanidine (ZANAFLEX) 2 MG capsule Take 2 mg by mouth 3 (three) times daily as needed for muscle spasms.  ? TRELEGY ELLIPTA 100-62.5-25 MCG/ACT AEPB INHALE 1 PUFF BY MOUTH EVERY DAY  ? vitamin B-12 (CYANOCOBALAMIN) 500 MCG tablet Take 2,500 mcg by mouth daily.  ? vitamin C (ASCORBIC ACID) 500 MG tablet Take 500 mg by mouth daily.  ? XARELTO 10 MG TABS tablet TAKE 1 TABLET BY MOUTH EVERY DAY  ? ?No facility-administered encounter medications on file as of 11/23/2021.  ? ? ?Allergies (verified) ?Gadolinium derivatives, Levaquin [levofloxacin in d5w], Sulfa antibiotics, Vancomycin, Aripiprazole, Codeine, Oxycodone, and Penicillins  ? ?History: ?Past Medical History:  ?Diagnosis Date  ? Allergy   ? Anxiety   ? Asthma   ? Blood transfusion 1979  ? Chidbirth  ? Breast cancer, left breast [place]  ? DCIS  ?  Chronic systolic CHF w/ Improved LVF 05/05/2015  ? NICM // Dx 04/2015 - EF initially 25-30% with RV dysfunction // f/u echo 08/2015 technically difficult, EF 35-40%, anterior, anteroseptal, apical and inferoapical severe hypokinesis suggestive of LAD territory ischemia/infarct, grade 1 DD, mild MR, RV normal. // Echo 5/18: mild LVH, EF 30-35, Gr 1 DD // cMRI 12/18: EF 52   ? COPD (chronic obstructive pulmonary disease) ([place])   ? Depression   ? DVT (deep venous thrombosis) ([place]) 04/2015  ? a. Dx 04/2015 -  patient returned 05/2015 after noncompliance with Xarelto.  ? Environmental allergies   ? Essential hypertension   ? Family Hx of adverse reaction to anesthesia   ? " My Mother would get deathly sick "  ? Gastroesophageal reflux disease   ? Head injury 2011  ? Hemorrhoids   ? History of cardiac catheterization   ? Cath 10/2015: oLCx 50 (prob cath induced spasm), no sig CAD, EF 35-45  ? LBBB (left bundle branch block)   ? Migraine HAs 11/2014  ? Obesity   ? Orthostatic hypotension   ? Pneumonia 2012  ? 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 a. Dx 04/2015 - patient returned 05/2015 after noncompliance with Xarelto.  ? Shortness of breath dyspnea   ? Syncope   ? a. Recurrent syncope in 2016 felt 2/2 orthostasis in setting of PE.  ? ?Past Surgical History:  ?Procedure Laterality Date  ? ABDOMINAL HYSTERECTOMY  1986  ? ANAL FISSURE REPAIR    ? ANTERIOR CERVICAL DECOMP/DISCECTOMY FUSION  01/23/2012  ? Procedure: ANTERIOR CERVICAL DECOMPRESSION/DISCECTOMY FUSION 1 LEVEL;  Surgeon: Elaina Hoops, MD;  Location: [facility];  Service: Neurosurgery;  Laterality: N/A;  Cervical five-six anterior cervical discectomy fusion with plating  ? APPENDECTOMY    ? BREAST BIOPSY Left 06/2016  ? US biopsy-PAPILLARY CYSTIC APOCRINE CHANGE   ? BREAST SURGERY Left 2011  ? Breast lumpectomy  ? CARDIAC CATHETERIZATION  2010  ?  Regional; pt states it was "clear"  ? CARDIAC CATHETERIZATION N/A 10/28/2015  ? Procedure: Right/Left Heart Cath and Coronary Angiography;  Surgeon: Belva Crome, MD;  Location: [place] [facility];  Service: Cardiovascular;  Laterality: N/A;  ? CARPAL TUNNEL RELEASE Bilateral   ? CESAREAN SECTION  1982, 1979  ? CHOLECYSTECTOMY    ? COLONOSCOPY  01/16/2009  ? Dr Bary Castilla  ? KNEE CARTILAGE SURGERY Left   ? SHOULDER ARTHROSCOPY WITH SUBACROMIAL DECOMPRESSION, ROTATOR CUFF REPAIR AND BICEP TENDON REPAIR Right 11/22/2013  ? Procedure: RIGHT SHOULDER ATHROSCOPY OPEN SUBSCAP REPAIR DELTA-PECTORAL ;  Surgeon: Augustin Schooling, MD;  Location: [facility];  Service: Orthopedics;  Laterality: Right;  ? TARSAL TUNNEL RELEASE Left 1990  ? TONSILLECTOMY    ? TUBAL LIGATION    ? ?Family History  ?Problem Relation Age of Onset  ? Breast cancer Paternal Aunt   ? Breast cancer Mother 10  ? Migraines Mother   ? Cancer Mother   ? Hypertension Mother   ? Diabetes Mother   ? Rectal cancer Father   ? Cancer Father   ? COPD Father   ? Alcohol abuse Father   ? Heart disease Paternal Grandmother   ? Stroke Paternal Grandmother   ? Heart attack Paternal Grandmother   ? Aneurysm Paternal Grandfather   ? COPD Paternal Grandfather   ? Breast cancer Cousin   ? Breast cancer Cousin   ? Breast cancer Cousin   ? Diabetes Sister   ? Anesthesia problems Neg  Hx   ? ?Social History  ? ?Socioeconomic History  ? Marital status: Widowed  ?  Spouse name: Not on file  ? Number of children: 2  ? Years of education: 40  ? Highest education level: Some college, no degree  ?Occupational History  ? Occupation: retired  ?Tobacco Use  ? Smoking status: Never  ? Smokeless tobacco: Never  ?Vaping Use  ? Vaping Use: Never used  ?Substance and Sexual Activity  ? Alcohol use: Not Currently  ?  Comment: occasional wine  ? Drug use: Never  ? Sexual activity: Not Currently  ?  Birth control/protection: None  ?Other Topics Concern  ? Not on file  ?Social History Narrative  ? Not on file  ? ?Social Determinants of Health  ? ?Financial Resource Strain: Low Risk   ? Difficulty of Paying Living Expenses: Not hard at all  ?Food Insecurity: No Food Insecurity  ? Worried About Charity fundraiser in the Last Year: Never true  ? Ran Out of Food in the Last Year: Never true  ?Transportation Needs: No Transportation Needs  ? Lack of Transportation (Medical): No  ? Lack of Transportation (Non-Medical): No  ?Physical Activity: Sufficiently Active  ? Days of Exercise per Week: 3 days  ? Minutes of Exercise per Session: 60 min  ?Stress: No Stress Concern Present  ? Feeling of Stress : Only a little   ?Social Connections: Moderately Integrated  ? Frequency of Communication with Friends and Family: More than three times a week  ? Frequency of Social Gatherings with Friends and Family: More than three t

## 2021-11-26 ENCOUNTER — Ambulatory Visit: Payer: Self-pay | Admitting: *Deleted

## 2021-11-26 ENCOUNTER — Encounter: Payer: Self-pay | Admitting: Family Medicine

## 2021-11-26 ENCOUNTER — Ambulatory Visit (INDEPENDENT_AMBULATORY_CARE_PROVIDER_SITE_OTHER): Payer: Medicare Other | Admitting: Family Medicine

## 2021-11-26 ENCOUNTER — Other Ambulatory Visit (HOSPITAL_COMMUNITY)
Admission: RE | Admit: 2021-11-26 | Discharge: 2021-11-26 | Disposition: A | Discharge status: Home / Self Care | Payer: Medicare Other | Source: Ambulatory Visit | Attending: Family Medicine | Admitting: Family Medicine

## 2021-11-26 VITALS — BP 128/78 | HR 78 | Temp 97.8°F | Resp 16 | Ht 64.0 in | Wt 232.5 lb

## 2021-11-26 DIAGNOSIS — F4321 Adjustment disorder with depressed mood: Secondary | ICD-10-CM

## 2021-11-26 DIAGNOSIS — B3731 Acute candidiasis of vulva and vagina: Secondary | ICD-10-CM | POA: Insufficient documentation

## 2021-11-26 DIAGNOSIS — R300 Dysuria: Secondary | ICD-10-CM | POA: Diagnosis not present

## 2021-11-26 DIAGNOSIS — M25562 Pain in left knee: Secondary | ICD-10-CM

## 2021-11-26 DIAGNOSIS — H60503 Unspecified acute noninfective otitis externa, bilateral: Secondary | ICD-10-CM

## 2021-11-26 DIAGNOSIS — R350 Frequency of micturition: Secondary | ICD-10-CM

## 2021-11-26 DIAGNOSIS — F410 Panic disorder [episodic paroxysmal anxiety] without agoraphobia: Secondary | ICD-10-CM

## 2021-11-26 LAB — POCT URINALYSIS DIPSTICK
Bilirubin, UA: NEGATIVE
Glucose, UA: POSITIVE — AB
Ketones, UA: NEGATIVE
Nitrite, UA: NEGATIVE
Protein, UA: NEGATIVE
Spec Grav, UA: 1.02 (ref 1.010–1.025)
Urobilinogen, UA: 0.2 E.U./dL
pH, UA: 5 (ref 5.0–8.0)

## 2021-11-26 MED ORDER — LORAZEPAM 1 MG PO TABS: 1.0000 mg | ORAL_TABLET | Freq: Once | ORAL | 0 refills | Status: DC | PRN

## 2021-11-26 MED ORDER — FLUCONAZOLE 150 MG PO TABS: 150.0000 mg | ORAL_TABLET | ORAL | 1 refills | Status: DC | PRN

## 2021-11-26 MED ORDER — NEOMYCIN-POLYMYXIN-HC 3.5-10000-1 OT SOLN
4.0000 [drp] | Freq: Three times a day (TID) | OTIC | 0 refills | Status: AC
Start: 2021-11-26 — End: 2021-12-03

## 2021-11-26 MED ORDER — CEPHALEXIN 500 MG PO CAPS: 500.0000 mg | ORAL_CAPSULE | Freq: Three times a day (TID) | ORAL | 0 refills | Status: AC

## 2021-11-26 NOTE — Telephone Encounter (Signed)
Reason for Disposition ? Age > 50 years ? ?Answer Assessment - Initial Assessment Questions ?1. SEVERITY: "How bad is the pain?"  (e.g., Scale 1-10; mild, moderate, or severe) ?  - MILD (1-3): complains slightly about urination hurting ?  - MODERATE (4-7): interferes with normal activities   ?  - SEVERE (8-10): excruciating, unwilling or unable to urinate because of the pain  ?    Burning with urination.   On Sun or Mon it started.    Last month I had a bacterial infection.    My husband died 11 months ago so I'm having a lot of stress. ?It's burning to urinate and I'm irritated on the outside.    I saw a nurse for my wellness check on Tues and told her about it.   I'm having lower abd pain too. ?2. FREQUENCY: "How many times have you had painful urination today?"  ?    I have to go quickly or I go on myself.   ?3. PATTERN: "Is pain present every time you urinate or just sometimes?"  ?    Yes ?4. ONSET: "When did the painful urination start?"  ?    Sun. Or Mon. ?5. FEVER: "Do you have a fever?" If Yes, ask: "What is your temperature, how was it measured, and when did it start?" ?    *No Answer* ?6. PAST UTI: "Have you had a urine infection before?" If Yes, ask: "When was the last time?" and "What happened that time?"  ?    yes ?7. CAUSE: "What do you think is causing the painful urination?"  (e.g., UTI, scratch, Herpes sore) ?    UTI ?8. OTHER SYMPTOMS: "Do you have any other symptoms?" (e.g., flank pain, vaginal discharge, genital sores, urgency, blood in urine) ?    Lower abd pain ?9. PREGNANCY: "Is there any chance you are pregnant?" "When was your last menstrual period?" ?    N/A ? ?Protocols used: Urination Pain - Female-A-AH ? ?

## 2021-11-26 NOTE — Telephone Encounter (Signed)
?  Chief Complaint: burning with urination, lower abd pain ?Symptoms: above ?Frequency: Since Sun. Or Mon. ?Pertinent Negatives: Patient denies blood in urine ?Disposition: '[]'$ ED /'[]'$ Urgent Care (no appt availability in office) / '[x]'$ Appointment(In office/virtual)/ '[]'$  [facility]/ '[]'$ Home Care/ '[]'$ Refused Recommended Disposition /'[]'$ [facility]/ '[]'$  Follow-up with PCP ?Additional Notes:   ?

## 2021-11-26 NOTE — Progress Notes (Signed)
? ? ?Patient ID: Patricia Fischer, female    DOB: 08/20/1956, 66 y.o.   MRN: [id number] ? ?PCP: Delsa Grana, PA-C ? ?Chief Complaint  ?Patient presents with  ? Dysuria  ?  Also itches, bad odor since Sunday.  ? Urinary Frequency  ?  Pt has had incidents where she does not make it to the restroom. It has happened several times  ? ? ?Subjective:  ? ?Patricia Fischer is a 66 y.o. female, presents to clinic with CC of the following: ? ?Multiple acute complaints - left knee pain x 1 month, UTI/vaginal sx, grief/anxiety severe reaction at dental office, ear itching  ? ?UTI sx with BV and yeast yaginitis treated a month ago, she finished meds and sx only improved for about a week before returning ? ?Dysuria frequency constant urethral irritation/pain  ?Urine odor ?Tried azo, cranberry juice eating yogurt pushing fluids, urgency to go is about every 10 min ?No vaginal sx currently - only urethral pain, no vaginal discharge, itching lesions rash swelling ?No abd pain, N, V, flank pain ?UA reviewed from today as well as last OV and results and tx - had yeast and BV tx with flagyl and diflucan, Urine culture was negative ?On farxiga since last year ?Results for orders placed or performed in visit on 11/26/21  ?POCT Urinalysis Dipstick  ?Result Value Ref Range  ? Color, UA Yellow   ? Clarity, UA Cloudy   ? Glucose, UA Positive (A) Negative  ? Bilirubin, UA Negative   ? Ketones, UA Negative   ? Spec Grav, UA 1.020 1.010 - 1.025  ? Blood, UA Trace   ? pH, UA 5.0 5.0 - 8.0  ? Protein, UA Negative Negative  ? Urobilinogen, UA 0.2 0.2 or 1.0 E.U./dL  ? Nitrite, UA Negative   ? Leukocytes, UA Trace (A) Negative  ? Appearance Yellow   ? Odor Foul   ? ? ? ? ?Patient Active Problem List  ? Diagnosis Date Noted  ? Chronic obstructive pulmonary disease ([place]) 01/22/2020  ? Stress incontinence 12/27/2019  ? Hyperlipidemia 12/27/2019  ? Muscle spasms of lower extremity 12/27/2019  ? Class 3 severe obesity with body mass index (BMI) of  40.0 to 44.9 in adult [facility]) 07/12/2017  ? Allergic rhinitis 06/26/2017  ? Hematuria 07/27/2016  ? Degenerative joint disease (DJD) of hip 03/31/2016  ? Hx of breast cancer 03/22/2016  ? History of pulmonary embolism 10/27/2015  ? HTN (hypertension) 10/07/2015  ? Depression, major, recurrent, moderate ([place]) 06/11/2015  ? Anemia 05/15/2015  ? Do not resuscitate status 05/15/2015  ? HFrEF (heart failure with reduced ejection fraction) ([place]) 05/05/2015  ? Left leg DVT ([place]) 05/05/2015  ? Pulmonary hypertension ([place])   ? Saddle pulmonary embolus (H[facility])   ? LBBB (left bundle branch block)-rate related  04/19/2015  ? Chronic migraine without aura, with intractable migraine, so stated, with status migrainosus 11/04/2014  ? Subscapularis (muscle) sprain 11/22/2013  ? ? ? ? ?Current Outpatient Medications:  ?  albuterol (VENTOLIN HFA) 108 (90 Base) MCG/ACT inhaler, Inhale 2 puffs into the lungs every 6 (six) hours as needed for wheezing or shortness of breath., Disp: 18 g, Rfl: 2 ?  carvedilol (COREG) 6.25 MG tablet, TAKE 1 TABLET BY MOUTH TWICE A DAY, Disp: 180 tablet, Rfl: 3 ?  cetirizine (ZYRTEC) 10 MG tablet, Take 10 mg by mouth daily., Disp: , Rfl:  ?  dapagliflozin propanediol (FARXIGA) 10 MG TABS tablet, Take 1 tablet (10 mg total) by mouth  daily before breakfast., Disp: 30 tablet, Rfl: 11 ?  fluticasone (FLONASE) 50 MCG/ACT nasal spray, INSTILL 1 SPRAY INTO EACH NOSTRIL EVERY DAY AS NEEDED, Disp: 48 mL, Rfl: 3 ?  losartan (COZAAR) 25 MG tablet, TAKE 1 TABLET (25 MG TOTAL) BY MOUTH DAILY., Disp: 90 tablet, Rfl: 3 ?  spironolactone (ALDACTONE) 25 MG tablet, Take 1 tablet (25 mg total) by mouth daily., Disp: 90 tablet, Rfl: 3 ?  tizanidine (ZANAFLEX) 2 MG capsule, Take 2 mg by mouth 3 (three) times daily as needed for muscle spasms., Disp: , Rfl:  ?  TRELEGY ELLIPTA 100-62.5-25 MCG/ACT AEPB, INHALE 1 PUFF BY MOUTH EVERY DAY, Disp: 60 each, Rfl: 1 ?  vitamin B-12 (CYANOCOBALAMIN) 500 MCG tablet, Take 2,500 mcg by mouth  daily., Disp: , Rfl:  ?  vitamin C (ASCORBIC ACID) 500 MG tablet, Take 500 mg by mouth daily., Disp: , Rfl:  ?  XARELTO 10 MG TABS tablet, TAKE 1 TABLET BY MOUTH EVERY DAY, Disp: 90 tablet, Rfl: 1 ? ? ?Allergies  ?Allergen Reactions  ? Gadolinium Derivatives Shortness Of Breath and Nausea And Vomiting  ? Levaquin [Levofloxacin In D5w] Itching  ?  Was giving with vancomycin at same time - unsure which one pt had a reaction to  ? Sulfa Antibiotics Swelling  ?  "Ears swelled up like Dumbo"  ? Vancomycin Itching  ?  Was giving at same time as Levaquin - unsure which pt had reaction to  ? Aripiprazole   ?  Dry mouth with sores ?  ? Codeine Itching and Rash  ? Oxycodone Itching  ? Penicillins Hives  ?  Has patient had a PCN reaction causing immediate rash, facial/tongue/throat swelling, SOB or lightheadedness with hypotension: Yes ?Has patient had a PCN reaction causing severe rash involving mucus membranes or skin necrosis: No ?Has patient had a PCN reaction that required hospitalization No ?Has patient had a PCN reaction occurring within the last 10 years: No ?If all of the above answers are "NO", then may proceed with Cephalosporin use.  ? ? ? ?Social History  ? ?Tobacco Use  ? Smoking status: Never  ? Smokeless tobacco: Never  ?Vaping Use  ? Vaping Use: Never used  ?Substance Use Topics  ? Alcohol use: Not Currently  ?  Comment: occasional wine  ? Drug use: Never  ?  ? ? ?Chart Review Today: ?I personally reviewed active problem list, medication list, allergies, family history, social history, health maintenance, notes from last encounter, lab results, imaging with the patient/caregiver today. ? ?Review of Systems  ?Constitutional: Negative.   ?HENT: Negative.    ?Eyes: Negative.   ?Respiratory: Negative.    ?Cardiovascular: Negative.   ?Gastrointestinal: Negative.   ?Endocrine: Negative.   ?Genitourinary: Negative.   ?Musculoskeletal: Negative.   ?Skin: Negative.   ?Allergic/Immunologic: Negative.   ?Neurological:  Negative.   ?Hematological: Negative.   ?Psychiatric/Behavioral: Negative.    ?All other systems reviewed and are negative. ? ?   ?Objective:  ? ?Vitals:  ? 11/26/21 1046  ?BP: 128/78  ?Pulse: 78  ?Resp: 16  ?Temp: 97.8 ?F (36.6 ?C)  ?TempSrc: Oral  ?SpO2: 96%  ?Weight: 232 lb 8 oz (105.5 kg)  ?Height: '5\' 4"'$  (1.626 m)  ?  ?Body mass index is 39.91 kg/m?. ? ?Physical Exam ?Vitals and nursing note reviewed.  ?Constitutional:   ?   General: She is not in acute distress. ?   Appearance: Normal appearance. She is well-developed. She is obese. She is not ill-appearing,  toxic-appearing or diaphoretic.  ?   Interventions: Face mask in place.  ?HENT:  ?   Head: Normocephalic and atraumatic.  ?   Right Ear: Hearing, tympanic membrane and external ear normal. No drainage or tenderness. No middle ear effusion. There is no impacted cerumen.  ?   Left Ear: Hearing, tympanic membrane and external ear normal. No drainage or tenderness.  No middle ear effusion. There is no impacted cerumen.  ?   Ears:  ?   Comments: B/l canals mildly swollen and red, no discharge ?Eyes:  ?   General: Lids are normal. No scleral icterus.    ?   Right eye: No discharge.     ?   Left eye: No discharge.  ?   Conjunctiva/sclera: Conjunctivae normal.  ?Neck:  ?   Trachea: Phonation normal. No tracheal deviation.  ?Cardiovascular:  ?   Rate and Rhythm: Normal rate and regular rhythm.  ?   Pulses: Normal pulses.     ?     Radial pulses are 2+ on the right side and 2+ on the left side.  ?     Posterior tibial pulses are 2+ on the right side and 2+ on the left side.  ?   Heart sounds: Normal heart sounds. No murmur heard. ?  No friction rub. No gallop.  ?Pulmonary:  ?   Effort: Pulmonary effort is normal. No respiratory distress.  ?   Breath sounds: Normal breath sounds. No stridor. No wheezing, rhonchi or rales.  ?Chest:  ?   Chest wall: No tenderness.  ?Abdominal:  ?   General: Bowel sounds are normal. There is no distension.  ?   Palpations: Abdomen is  soft.  ?   Tenderness: There is no abdominal tenderness. There is no right CVA tenderness, left CVA tenderness, guarding or rebound.  ?Musculoskeletal:  ?   Left knee: No swelling, deformity, effusion, bony ten

## 2021-11-26 NOTE — Patient Instructions (Signed)
Can take 1 ativan about 60-90 minutes before your dentist appointment - you will not be able to drive so you must have someone take you there and pick you up and you must inform your dentist about the ativan. ? ?Please let me know if you need to see the ortho specialists for your knee ? ? ?

## 2021-11-27 LAB — URINE CULTURE
MICRO NUMBER:: 13177129
SPECIMEN QUALITY:: ADEQUATE

## 2021-11-29 LAB — CERVICOVAGINAL ANCILLARY ONLY
Bacterial Vaginitis (gardnerella): POSITIVE — AB
Candida Glabrata: NEGATIVE
Candida Vaginitis: POSITIVE — AB
Chlamydia: NEGATIVE
Comment: NEGATIVE
Comment: NEGATIVE
Comment: NEGATIVE
Comment: NEGATIVE
Comment: NEGATIVE
Comment: NORMAL
Neisseria Gonorrhea: NEGATIVE
Trichomonas: NEGATIVE

## 2021-11-29 MED ORDER — NITROGLYCERIN 0.4 MG SL SUBL
SUBLINGUAL_TABLET | SUBLINGUAL | Status: AC
  Filled 2021-11-29: qty 2

## 2021-11-30 MED ORDER — METRONIDAZOLE 0.75 % VA GEL: 1.0000 | Freq: Every day | VAGINAL | 0 refills | Status: AC

## 2021-11-30 NOTE — Addendum Note (Signed)
Addended by: Delsa Grana on: 11/30/2021 02:52 PM ? ? Modules accepted: Orders ? ?

## 2021-12-16 ENCOUNTER — Encounter: Payer: Self-pay | Admitting: Family Medicine

## 2021-12-21 DIAGNOSIS — M2392 Unspecified internal derangement of left knee: Secondary | ICD-10-CM | POA: Insufficient documentation

## 2021-12-21 DIAGNOSIS — M25562 Pain in left knee: Secondary | ICD-10-CM | POA: Insufficient documentation

## 2021-12-30 ENCOUNTER — Other Ambulatory Visit: Payer: Self-pay | Admitting: Family Medicine

## 2021-12-30 DIAGNOSIS — J302 Other seasonal allergic rhinitis: Secondary | ICD-10-CM

## 2022-01-14 ENCOUNTER — Other Ambulatory Visit: Payer: Self-pay | Admitting: Interventional Cardiology

## 2022-01-19 ENCOUNTER — Other Ambulatory Visit: Payer: Self-pay | Admitting: Family Medicine

## 2022-01-19 DIAGNOSIS — J441 Chronic obstructive pulmonary disease with (acute) exacerbation: Secondary | ICD-10-CM

## 2022-01-19 NOTE — Telephone Encounter (Signed)
Requested medication (s) are due for refill today:   Yes ? ?Requested medication (s) are on the active medication list:   Yes ? ?Future visit scheduled:    ? ? ?Last ordered: 11/17/2021, 60 each, 1 refill  ? ?Returned because no protocol assigned to this medication ? ?Requested Prescriptions  ?Pending Prescriptions Disp Refills  ? TRELEGY ELLIPTA 100-62.5-25 MCG/ACT AEPB [Pharmacy Med Name: TRELEGY ELLIPTA 100-62.5-25] 60 each 1  ?  Sig: INHALE 1 PUFF BY MOUTH EVERY DAY  ?  ? There is no refill protocol information for this order  ?  ? ?

## 2022-01-26 ENCOUNTER — Ambulatory Visit (INDEPENDENT_AMBULATORY_CARE_PROVIDER_SITE_OTHER): Payer: Medicare Other | Admitting: Family Medicine

## 2022-01-26 ENCOUNTER — Encounter: Payer: Self-pay | Admitting: Family Medicine

## 2022-01-26 VITALS — BP 132/78 | HR 98 | Temp 98.2°F | Resp 16 | Ht 64.5 in | Wt 231.0 lb

## 2022-01-26 DIAGNOSIS — B3731 Acute candidiasis of vulva and vagina: Secondary | ICD-10-CM

## 2022-01-26 DIAGNOSIS — F4321 Adjustment disorder with depressed mood: Secondary | ICD-10-CM | POA: Diagnosis not present

## 2022-01-26 DIAGNOSIS — F410 Panic disorder [episodic paroxysmal anxiety] without agoraphobia: Secondary | ICD-10-CM | POA: Diagnosis not present

## 2022-01-26 DIAGNOSIS — Z6839 Body mass index (BMI) 39.0-39.9, adult: Secondary | ICD-10-CM | POA: Diagnosis not present

## 2022-01-26 DIAGNOSIS — E669 Obesity, unspecified: Secondary | ICD-10-CM | POA: Diagnosis not present

## 2022-01-26 MED ORDER — FLUOXETINE HCL 10 MG PO CAPS: 10.0000 mg | ORAL_CAPSULE | Freq: Every day | ORAL | 3 refills | Status: DC

## 2022-01-26 MED ORDER — HYDROXYZINE PAMOATE 25 MG PO CAPS: 25.0000 mg | ORAL_CAPSULE | Freq: Three times a day (TID) | ORAL | 2 refills | Status: DC | PRN

## 2022-01-26 NOTE — Progress Notes (Signed)
Patient ID: [name], female    DOB: 1956/05/04, 66 y.o.   MRN: [id number]  PCP: Delsa Grana, PA-C  Chief Complaint  Patient presents with   Anxiety   Depression    Grieving    Weight Loss    Medication inquiry    Subjective:   Patricia Fischer is a 66 y.o. female, presents to clinic with CC of the following:  HPI  Upset today, once year anniversary of husbands death Crying all day     2022/02/22    3:14 PM 11/26/2021   10:48 AM 11/23/2021    4:05 PM  Depression screen PHQ 2/9  Decreased Interest '1 2 1  '$ Down, Depressed, Hopeless '1 2 1  '$ PHQ - 2 Score '2 4 2  '$ Altered sleeping 3 2 0  Tired, decreased energy '3 1 1  '$ Change in appetite 3 0 0  Feeling bad or failure about yourself  1 0 0  Trouble concentrating 1 0 0  Moving slowly or fidgety/restless 0 0 0  Suicidal thoughts 0 0 0  PHQ-9 Score '13 7 3  '$ Difficult doing work/chores Not difficult at all Somewhat difficult Not difficult at all      02-22-2022    3:16 PM  GAD 7 : Generalized Anxiety Score  Nervous, Anxious, on Edge 1  Control/stop worrying 1  Worry too much - different things 3  Trouble relaxing 2  Restless 0  Easily annoyed or irritable 0  Afraid - awful might happen 0  Total GAD 7 Score 7  Anxiety Difficulty Not difficult at all   She has had a lot of life changes in the past year, moved [place] not sure about daily medications  Also inquires about weight loss meds Wt Readings from Last 5 Encounters:  02/03/22 233 lb 9.6 oz (106 kg)  February 22, 2022 231 lb (104.8 kg)  11/26/21 232 lb 8 oz (105.5 kg)  10/13/21 235 lb (106.6 kg)  10/01/21 235 lb (106.6 kg)   BMI Readings from Last 5 Encounters:  02/03/22 39.48 kg/m  Feb 22, 2022 39.04 kg/m  11/26/21 39.91 kg/m  10/13/21 39.11 kg/m  10/01/21 40.34 kg/m      Patient Active Problem List   Diagnosis Date Noted   Pain in joint of left knee 12/21/2021   Derangement of left knee 12/21/2021   Chronic obstructive pulmonary disease ([place])  01/22/2020   Stress incontinence 12/27/2019   Hyperlipidemia 12/27/2019   Muscle spasms of lower extremity 12/27/2019   Class 3 severe obesity with body mass index (BMI) of 40.0 to 44.9 in adult ([place]) 07/12/2017   Allergic rhinitis 06/26/2017   Hematuria 07/27/2016   Degenerative joint disease (DJD) of hip 03/31/2016   Hx of breast cancer 03/22/2016   History of pulmonary embolism 10/27/2015   HTN (hypertension) 10/07/2015   Depression, major, recurrent, moderate ([place]) 06/11/2015   Anemia 05/15/2015   Do not resuscitate status 05/15/2015   HFrEF (heart failure with reduced ejection fraction) ([place]) 05/05/2015   Left leg DVT ([place]) 05/05/2015   Pulmonary hypertension ([facility])    Saddle pulmonary embolus ([facility])    LBBB (left bundle branch block)-rate related  04/19/2015   Chronic migraine without aura, with intractable migraine, so stated, with status migrainosus 11/04/2014   Subscapularis (muscle) sprain 11/22/2013      Current Outpatient Medications:    albuterol (VENTOLIN HFA) 108 (90 Base) MCG/ACT inhaler, Inhale 2 puffs into the lungs every 6 (six) hours as needed for wheezing or shortness of breath.,  Disp: 18 g, Rfl: 2   carvedilol (COREG) 6.25 MG tablet, TAKE 1 TABLET BY MOUTH TWICE A DAY, Disp: 180 tablet, Rfl: 3   cetirizine (ZYRTEC) 10 MG tablet, Take 10 mg by mouth daily., Disp: , Rfl:    fluticasone (FLONASE) 50 MCG/ACT nasal spray, INSTILL 1 SPRAY INTO EACH NOSTRIL EVERY DAY AS NEEDED, Disp: 48 mL, Rfl: 1   losartan (COZAAR) 25 MG tablet, TAKE 1 TABLET (25 MG TOTAL) BY MOUTH DAILY., Disp: 90 tablet, Rfl: 3   losartan (COZAAR) 25 MG tablet, losartan 25 mg tablet  Take 1 tablet every day by oral route., Disp: , Rfl:    spironolactone (ALDACTONE) 25 MG tablet, Take 1 tablet (25 mg total) by mouth daily., Disp: 90 tablet, Rfl: 3   tizanidine (ZANAFLEX) 2 MG capsule, tizanidine 2 mg capsule  Take 1 capsule every 6 hours by oral route., Disp: , Rfl:    TRELEGY ELLIPTA 100-62.5-25  MCG/ACT AEPB, INHALE 1 PUFF BY MOUTH EVERY DAY, Disp: 84 each, Rfl: 1   vitamin B-12 (CYANOCOBALAMIN) 500 MCG tablet, Take 2,500 mcg by mouth daily., Disp: , Rfl:    vitamin C (ASCORBIC ACID) 500 MG tablet, Take 500 mg by mouth daily., Disp: , Rfl:    XARELTO 10 MG TABS tablet, TAKE 1 TABLET BY MOUTH EVERY DAY, Disp: 90 tablet, Rfl: 1   FARXIGA 10 MG TABS tablet, TAKE 1 TABLET BY MOUTH DAILY BEFORE BREAKFAST. (Patient not taking: Reported on 01/26/2022), Disp: 90 tablet, Rfl: 3   fluconazole (DIFLUCAN) 150 MG tablet, Take 1 tablet (150 mg total) by mouth every 3 (three) days as needed (for vaginal itching/yeast infection sx). May repeat dose after finishing antibiotics (Patient not taking: Reported on 01/26/2022), Disp: 2 tablet, Rfl: 1   LORazepam (ATIVAN) 1 MG tablet, Take 1 tablet (1 mg total) by mouth once as needed for up to 1 dose for anxiety (for procedural anxiety - take one hour prior to appt). (Patient not taking: Reported on 01/26/2022), Disp: 2 tablet, Rfl: 0   tizanidine (ZANAFLEX) 2 MG capsule, Take 2 mg by mouth 3 (three) times daily as needed for muscle spasms. (Patient not taking: Reported on 01/26/2022), Disp: , Rfl:    Allergies  Allergen Reactions   Gadolinium Derivatives Shortness Of Breath and Nausea And Vomiting   Levaquin [Levofloxacin In D5w] Itching    Was giving with vancomycin at same time - unsure which one pt had a reaction to   Sulfa Antibiotics Swelling    "Ears swelled up like Dumbo"   Vancomycin Itching    Was giving at same time as Levaquin - unsure which pt had reaction to   Aripiprazole Other (See Comments)    Dry mouth with sores    Levofloxacin Itching   Codeine Itching and Rash   Oxycodone Itching   Penicillins Hives    Has patient had a PCN reaction causing immediate rash, facial/tongue/throat swelling, SOB or lightheadedness with hypotension: Yes Has patient had a PCN reaction causing severe rash involving mucus membranes or skin necrosis: No Has  patient had a PCN reaction that required hospitalization No Has patient had a PCN reaction occurring within the last 10 years: No If all of the above answers are "NO", then may proceed with Cephalosporin use.     Social History   Tobacco Use   Smoking status: Never   Smokeless tobacco: Never  Vaping Use   Vaping Use: Never used  Substance Use Topics   Alcohol use: Not Currently  Comment: occasional wine   Drug use: Never      Chart Review Today: I personally reviewed active problem list, medication list, allergies, family history, social history, health maintenance, notes from last encounter, lab results, imaging with the patient/caregiver today.   Review of Systems  Constitutional: Negative.   HENT: Negative.    Eyes: Negative.   Respiratory: Negative.    Cardiovascular: Negative.   Gastrointestinal: Negative.   Endocrine: Negative.   Genitourinary: Negative.   Musculoskeletal: Negative.   Skin: Negative.   Allergic/Immunologic: Negative.   Neurological: Negative.   Hematological: Negative.   Psychiatric/Behavioral: Negative.    All other systems reviewed and are negative.     Objective:   There were no vitals filed for this visit.  There is no height or weight on file to calculate BMI.  Physical Exam Vitals and nursing note reviewed.  Constitutional:      General: She is not in acute distress.    Appearance: Normal appearance. She is well-developed and well-groomed. She is obese. She is not ill-appearing, toxic-appearing or diaphoretic.  HENT:     Head: Normocephalic and atraumatic.     Right Ear: External ear normal.     Left Ear: External ear normal.  Eyes:     General: No scleral icterus.       Right eye: No discharge.        Left eye: No discharge.     Conjunctiva/sclera: Conjunctivae normal.  Cardiovascular:     Rate and Rhythm: Normal rate and regular rhythm.     Pulses: Normal pulses.     Heart sounds: Normal heart sounds.  Pulmonary:      Effort: Pulmonary effort is normal. No respiratory distress.  Skin:    General: Skin is warm and dry.  Neurological:     Mental Status: She is alert. Mental status is at baseline.  Psychiatric:        Attention and Perception: Attention normal.        Mood and Affect: Mood is depressed. Mood is not anxious. Affect is tearful. Affect is not labile or inappropriate.        Speech: Speech normal.        Behavior: Behavior normal. Behavior is cooperative.        Thought Content: Thought content does not include homicidal or suicidal ideation. Thought content does not include homicidal or suicidal plan.     Results for orders placed or performed in visit on 11/26/21  Urine Culture   Specimen: Urine  Result Value Ref Range   MICRO NUMBER: [id number]    SPECIMEN QUALITY: Adequate    Sample Source URINE    STATUS: FINAL    Result:      Mixed genital flora isolated. These superficial bacteria are not indicative of a urinary tract infection. No further organism identification is warranted on this specimen. If clinically indicated, recollect clean-catch, mid-stream urine and transfer  immediately to Urine Culture Transport Tube.   POCT Urinalysis Dipstick  Result Value Ref Range   Color, UA Yellow    Clarity, UA Cloudy    Glucose, UA Positive (A) Negative   Bilirubin, UA Negative    Ketones, UA Negative    Spec Grav, UA 1.020 1.010 - 1.025   Blood, UA Trace    pH, UA 5.0 5.0 - 8.0   Protein, UA Negative Negative   Urobilinogen, UA 0.2 0.2 or 1.0 E.U./dL   Nitrite, UA Negative    Leukocytes, UA Trace (A)  Negative   Appearance Yellow    Odor Foul   Cervicovaginal ancillary only  Result Value Ref Range   Neisseria Gonorrhea Negative    Chlamydia Negative    Trichomonas Negative    Bacterial Vaginitis (gardnerella) Positive (A)    Candida Vaginitis Positive (A)    Candida Glabrata Negative    Comment      Normal Reference Range Bacterial Vaginosis - Negative   Comment Normal  Reference Range Candida Species - Negative    Comment Normal Reference Range Candida Galbrata - Negative    Comment Normal Reference Range Trichomonas - Negative    Comment Normal Reference [place] Chlamydia - Negative    Comment      Normal Reference Range Neisseria Gonorrhea - Negative       Assessment & Plan:      ICD-10-CM   1. Grief reaction  F43.21 hydrOXYzine (VISTARIL) 25 MG capsule    FLUoxetine (PROZAC) 10 MG capsule   supportive listening, discussed therapy, groups, grief counseling, meds    2. Anxiety attack  F41.0 hydrOXYzine (VISTARIL) 25 MG capsule    FLUoxetine (PROZAC) 10 MG capsule   trial of hydroxyzine for insomnia and anxiety, avoid benzos, with prolonged sx and depressed mood and + PHQ screening SSRI and therapy discussed    3. Class 2 obesity with body mass index (BMI) of 39.0 to 39.9 in adult, unspecified obesity type, unspecified whether serious comorbidity present  E66.9    Z68.39    weight has decreased, encouraged pt to contact insurance to see if they cover meds for weight loss, f/up, encouraged healthy diet and exercise      F/up in ~ 2-3 months for med check      Delsa Grana, PA-C 01/26/22 3:49 PM
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
## 2022-02-03 ENCOUNTER — Ambulatory Visit: Payer: Medicare Other | Admitting: Interventional Cardiology

## 2022-02-03 ENCOUNTER — Encounter: Payer: Self-pay | Admitting: Interventional Cardiology

## 2022-02-03 VITALS — BP 124/78 | HR 68 | Ht 64.5 in | Wt 233.6 lb

## 2022-02-03 DIAGNOSIS — I10 Essential (primary) hypertension: Secondary | ICD-10-CM | POA: Diagnosis not present

## 2022-02-03 DIAGNOSIS — I272 Pulmonary hypertension, unspecified: Secondary | ICD-10-CM | POA: Diagnosis not present

## 2022-02-03 DIAGNOSIS — I5022 Chronic systolic (congestive) heart failure: Secondary | ICD-10-CM | POA: Diagnosis not present

## 2022-02-03 DIAGNOSIS — I447 Left bundle-branch block, unspecified: Secondary | ICD-10-CM

## 2022-02-03 DIAGNOSIS — Z7901 Long term (current) use of anticoagulants: Secondary | ICD-10-CM

## 2022-02-03 MED ORDER — SACUBITRIL-VALSARTAN 24-26 MG PO TABS: 1.0000 | ORAL_TABLET | Freq: Two times a day (BID) | ORAL | 11 refills | Status: DC

## 2022-02-03 NOTE — Patient Instructions (Signed)
Medication Instructions:  Your physician has recommended you make the following change in your medication:   1) STOP Losartan 2) START Entresto 24-'26mg'$  twice daily  *If you need a refill on your cardiac medications before your next appointment, please call your pharmacy*  Lab Work: In 2 weeks: BMET If you have labs (blood work) drawn today and your tests are completely normal, you will receive your results only by: [place] (if you have MyChart) OR A paper copy in the mail If you have any lab test that is abnormal or we need to change your treatment, we will call you to review the results.  Testing/Procedures: NONE  Follow-Up: At [facility], you and your health needs are our priority.  As part of our continuing mission to provide you with exceptional heart care, we have created designated Provider Care Teams.  These Care Teams include your primary Cardiologist (physician) and Advanced Practice Providers (APPs -  Physician Assistants and Nurse Practitioners) who all work together to provide you with the care you need, when you need it.  Your next appointment:   6-8 week(s)  The format for your next appointment:   In Person  Provider:   Sinclair Grooms, MD {   Important Information About Sugar

## 2022-02-04 ENCOUNTER — Encounter: Payer: Self-pay | Admitting: Family Medicine

## 2022-02-04 DIAGNOSIS — E669 Obesity, unspecified: Secondary | ICD-10-CM

## 2022-02-04 DIAGNOSIS — Z7689 Persons encountering health services in other specified circumstances: Secondary | ICD-10-CM

## 2022-02-05 ENCOUNTER — Other Ambulatory Visit: Payer: Self-pay | Admitting: Interventional Cardiology

## 2022-02-07 MED ORDER — SEMAGLUTIDE-WEIGHT MANAGEMENT 0.25 MG/0.5ML ~~LOC~~ SOAJ: 0.2500 mg | SUBCUTANEOUS | 0 refills | Status: AC

## 2022-02-09 DIAGNOSIS — F40240 Claustrophobia: Secondary | ICD-10-CM | POA: Insufficient documentation

## 2022-02-18 ENCOUNTER — Encounter: Payer: Medicare Other | Admitting: Family Medicine

## 2022-02-22 ENCOUNTER — Other Ambulatory Visit: Payer: Medicare Other

## 2022-02-22 DIAGNOSIS — I10 Essential (primary) hypertension: Secondary | ICD-10-CM

## 2022-02-22 DIAGNOSIS — I5022 Chronic systolic (congestive) heart failure: Secondary | ICD-10-CM

## 2022-02-23 LAB — BASIC METABOLIC PANEL
BUN/Creatinine Ratio: 11 — ABNORMAL LOW (ref 12–28)
BUN: 10 mg/dL (ref 8–27)
CO2: 26 mmol/L (ref 20–29)
Calcium: 9 mg/dL (ref 8.7–10.3)
Chloride: 101 mmol/L (ref 96–106)
Creatinine, Ser: 0.9 mg/dL (ref 0.57–1.00)
Glucose: 99 mg/dL (ref 70–99)
Potassium: 4.1 mmol/L (ref 3.5–5.2)
Sodium: 140 mmol/L (ref 134–144)
eGFR: 70 mL/min/{1.73_m2} (ref >59)

## 2022-03-04 ENCOUNTER — Encounter: Payer: Medicare Other | Admitting: Physician Assistant

## 2022-03-10 ENCOUNTER — Encounter (HOSPITAL_COMMUNITY): Payer: Medicare Other | Admitting: Internal Medicine

## 2022-03-14 NOTE — Patient Instructions (Incomplete)
Preventive Care 65 Years and Older, Female Preventive care refers to lifestyle choices and visits with your health care provider that can promote health and wellness. Preventive care visits are also called wellness exams. What can I expect for my preventive care visit? Counseling Your health care provider may ask you questions about your: Medical history, including: Past medical problems. Family medical history. Pregnancy and menstrual history. History of falls. Current health, including: Memory and ability to understand (cognition). Emotional well-being. Home life and relationship well-being. Sexual activity and sexual health. Lifestyle, including: Alcohol, nicotine or tobacco, and drug use. Access to firearms. Diet, exercise, and sleep habits. Work and work environment. Sunscreen use. Safety issues such as seatbelt and bike helmet use. Physical exam Your health care provider will check your: Height and weight. These may be used to calculate your BMI (body mass index). BMI is a measurement that tells if you are at a healthy weight. Waist circumference. This measures the distance around your waistline. This measurement also tells if you are at a healthy weight and may help predict your risk of certain diseases, such as type 2 diabetes and high blood pressure. Heart rate and blood pressure. Body temperature. Skin for abnormal spots. What immunizations do I need?  Vaccines are usually given at various ages, according to a schedule. Your health care provider will recommend vaccines for you based on your age, medical history, and lifestyle or other factors, such as travel or where you work. What tests do I need? Screening Your health care provider may recommend screening tests for certain conditions. This may include: Lipid and cholesterol levels. Hepatitis C test. Hepatitis B test. HIV (human immunodeficiency virus) test. STI (sexually transmitted infection) testing, if you are at  risk. Lung cancer screening. Colorectal cancer screening. Diabetes screening. This is done by checking your blood sugar (glucose) after you have not eaten for a while (fasting). Mammogram. Talk with your health care provider about how often you should have regular mammograms. BRCA-related cancer screening. This may be done if you have a family history of breast, ovarian, tubal, or peritoneal cancers. Bone density scan. This is done to screen for osteoporosis. Talk with your health care provider about your test results, treatment options, and if necessary, the need for more tests. Follow these instructions at home: Eating and drinking  Eat a diet that includes fresh fruits and vegetables, whole grains, lean protein, and low-fat dairy products. Limit your intake of foods with high amounts of sugar, saturated fats, and salt. Take vitamin and mineral supplements as recommended by your health care provider. Do not drink alcohol if your health care provider tells you not to drink. If you drink alcohol: Limit how much you have to 0-1 drink a day. Know how much alcohol is in your drink. In the [place] one drink equals one 12 oz bottle of beer (355 mL), one 5 oz glass of wine (148 mL), or one 1 oz glass of hard liquor (44 mL). Lifestyle Brush your teeth every morning and night with fluoride toothpaste. Floss one time each day. Exercise for at least 30 minutes 5 or more days each week. Do not use any products that contain nicotine or tobacco. These products include cigarettes, chewing tobacco, and vaping devices, such as e-cigarettes. If you need help quitting, ask your health care provider. Do not use drugs. If you are sexually active, practice safe sex. Use a condom or other form of protection in order to prevent STIs. Take aspirin only as told by   your health care provider. Make sure that you understand how much to take and what form to take. Work with your health care provider to find out whether it  is safe and beneficial for you to take aspirin daily. Ask your health care provider if you need to take a cholesterol-lowering medicine (statin). Find healthy ways to manage stress, such as: Meditation, yoga, or listening to music. Journaling. Talking to a trusted person. Spending time with friends and family. Minimize exposure to UV radiation to reduce your risk of skin cancer. Safety Always wear your seat belt while driving or riding in a vehicle. Do not drive: If you have been drinking alcohol. Do not ride with someone who has been drinking. When you are tired or distracted. While texting. If you have been using any mind-altering substances or drugs. Wear a helmet and other protective equipment during sports activities. If you have firearms in your house, make sure you follow all gun safety procedures. What's next? Visit your health care provider once a year for an annual wellness visit. Ask your health care provider how often you should have your eyes and teeth checked. Stay up to date on all vaccines. This information is not intended to replace advice given to you by your health care provider. Make sure you discuss any questions you have with your health care provider. Document Revised: 02/17/2021 Document Reviewed: 02/17/2021 Elsevier Patient Education  2023 [employer]  

## 2022-03-15 ENCOUNTER — Encounter: Payer: Self-pay | Admitting: Family Medicine

## 2022-03-15 ENCOUNTER — Ambulatory Visit (INDEPENDENT_AMBULATORY_CARE_PROVIDER_SITE_OTHER): Payer: Medicare Other | Admitting: Family Medicine

## 2022-03-15 VITALS — BP 124/68 | HR 87 | Temp 98.0°F | Resp 16 | Ht 64.5 in | Wt 230.5 lb

## 2022-03-15 DIAGNOSIS — I10 Essential (primary) hypertension: Secondary | ICD-10-CM

## 2022-03-15 DIAGNOSIS — E785 Hyperlipidemia, unspecified: Secondary | ICD-10-CM

## 2022-03-15 DIAGNOSIS — Z23 Encounter for immunization: Secondary | ICD-10-CM

## 2022-03-15 DIAGNOSIS — F331 Major depressive disorder, recurrent, moderate: Secondary | ICD-10-CM

## 2022-03-15 DIAGNOSIS — Z0000 Encounter for general adult medical examination without abnormal findings: Secondary | ICD-10-CM

## 2022-03-15 DIAGNOSIS — R195 Other fecal abnormalities: Secondary | ICD-10-CM

## 2022-03-15 DIAGNOSIS — Z780 Asymptomatic menopausal state: Secondary | ICD-10-CM | POA: Diagnosis not present

## 2022-03-15 DIAGNOSIS — Z1211 Encounter for screening for malignant neoplasm of colon: Secondary | ICD-10-CM

## 2022-03-15 DIAGNOSIS — F410 Panic disorder [episodic paroxysmal anxiety] without agoraphobia: Secondary | ICD-10-CM

## 2022-03-15 DIAGNOSIS — Z6841 Body Mass Index (BMI) 40.0 and over, adult: Secondary | ICD-10-CM

## 2022-03-15 DIAGNOSIS — I2699 Other pulmonary embolism without acute cor pulmonale: Secondary | ICD-10-CM

## 2022-03-15 DIAGNOSIS — J302 Other seasonal allergic rhinitis: Secondary | ICD-10-CM

## 2022-03-15 DIAGNOSIS — J449 Chronic obstructive pulmonary disease, unspecified: Secondary | ICD-10-CM

## 2022-03-15 LAB — CBC WITH DIFFERENTIAL/PLATELET
Absolute Monocytes: 567 cells/uL (ref 200–950)
Basophils Absolute: 41 cells/uL (ref 0–200)
Basophils Relative: 0.5 %
Eosinophils Absolute: 138 cells/uL (ref 15–500)
Eosinophils Relative: 1.7 %
HCT: 43.4 % (ref 35.0–45.0)
Hemoglobin: 14.4 g/dL (ref 11.7–15.5)
Lymphs Abs: 1750 cells/uL (ref 850–3900)
MCH: 29.9 pg (ref 27.0–33.0)
MCHC: 33.2 g/dL (ref 32.0–36.0)
MCV: 90.2 fL (ref 80.0–100.0)
MPV: 11.3 fL (ref 7.5–12.5)
Monocytes Relative: 7 %
Neutro Abs: 5605 cells/uL (ref 1500–7800)
Neutrophils Relative %: 69.2 %
Platelets: 192 10*3/uL (ref 140–400)
RBC: 4.81 10*6/uL (ref 3.80–5.10)
RDW: 13.3 % (ref 11.0–15.0)
Total Lymphocyte: 21.6 %
WBC: 8.1 10*3/uL (ref 3.8–10.8)

## 2022-03-15 LAB — COMPLETE METABOLIC PANEL WITH GFR
AG Ratio: 1.7 (calc) (ref 1.0–2.5)
ALT: 13 U/L (ref 6–29)
AST: 17 U/L (ref 10–35)
Albumin: 4.3 g/dL (ref 3.6–5.1)
Alkaline phosphatase (APISO): 78 U/L (ref 37–153)
BUN: 11 mg/dL (ref 7–25)
CO2: 29 mmol/L (ref 20–32)
Calcium: 9.5 mg/dL (ref 8.6–10.4)
Chloride: 103 mmol/L (ref 98–110)
Creat: 0.88 mg/dL (ref 0.50–1.05)
Globulin: 2.6 g/dL (calc) (ref 1.9–3.7)
Glucose, Bld: 104 mg/dL — ABNORMAL HIGH (ref 65–99)
Potassium: 4.6 mmol/L (ref 3.5–5.3)
Sodium: 139 mmol/L (ref 135–146)
Total Bilirubin: 0.7 mg/dL (ref 0.2–1.2)
Total Protein: 6.9 g/dL (ref 6.1–8.1)
eGFR: 72 mL/min/{1.73_m2} (ref >=60)

## 2022-03-15 LAB — LIPID PANEL
Cholesterol: 190 mg/dL (ref <200)
HDL: 48 mg/dL — ABNORMAL LOW (ref >=50)
LDL Cholesterol (Calc): 120 mg/dL (calc) — ABNORMAL HIGH
Non-HDL Cholesterol (Calc): 142 mg/dL (calc) — ABNORMAL HIGH (ref <130)
Total CHOL/HDL Ratio: 4 (calc) (ref <5.0)
Triglycerides: 116 mg/dL (ref <150)

## 2022-03-15 MED ORDER — ZOSTER VAC RECOMB ADJUVANTED 50 MCG/0.5ML IM SUSR: 0.5000 mL | Freq: Once | INTRAMUSCULAR | 1 refills | Status: AC

## 2022-03-15 MED ORDER — FLUTICASONE PROPIONATE 50 MCG/ACT NA SUSP: 2.0000 | Freq: Every day | NASAL | 1 refills | Status: DC

## 2022-03-15 MED ORDER — LORAZEPAM 1 MG PO TABS: 1.0000 mg | ORAL_TABLET | Freq: Once | ORAL | 0 refills | Status: DC | PRN

## 2022-03-15 NOTE — Progress Notes (Signed)
  Patient: Patricia Fischer, Female    DOB: 07/17/1956, 66 y.o.   MRN: [id number] , , PA-C Visit Date: 03/15/2022  Today's Provider:  , PA-C   Chief Complaint  Patient presents with   Annual Exam   Subjective:   Annual physical exam:  Patricia Fischer is a 66 y.o. female who presents today for complete physical exam:  Exercise/Activity:  exercising 3 d a week - ortho with shots to left knee trying to prevent surgery  Diet/nutrition:  healthy as much as possible Sleep:  poor - stopped all meds for sleep and mood  SDOH Screenings   Alcohol Screen: Low Risk  (03/15/2022)   Alcohol Screen    Last Alcohol Screening Score (AUDIT): 0  Depression (PHQ2-9): Medium Risk (03/15/2022)   Depression (PHQ2-9)    PHQ-2 Score: 9  Financial Resource Strain: Low Risk  (03/15/2022)   Overall Financial Resource Strain (CARDIA)    Difficulty of Paying Living Expenses: Not hard at all  Food Insecurity: No Food Insecurity (03/15/2022)   Hunger Vital Sign    Worried About Running Out of Food in the Last Year: Never true    Ran Out of Food in the Last Year: Never true  Housing: Low Risk  (03/15/2022)   Housing    Last Housing Risk Score: 0  Physical Activity: Sufficiently Active (03/15/2022)   Exercise Vital Sign    Days of Exercise per Week: 3 days    Minutes of Exercise per Session: 50 min  Social Connections: Moderately Integrated (03/15/2022)   Social Connection and Isolation Panel [NHANES]    Frequency of Communication with Friends and Family: Three times a week    Frequency of Social Gatherings with Friends and Family: Three times a week    Attends Religious Services: More than 4 times per year    Active Member of Clubs or Organizations: Yes    Attends Club or Organization Meetings: More than 4 times per year    Marital Status: Widowed  Stress: Stress Concern Present (03/15/2022)   [employer] - Occupational Stress Questionnaire     Feeling of Stress : To some extent  Tobacco Use: Low Risk  (03/15/2022)   Patient History    Smoking Tobacco Use: Never    Smokeless Tobacco Use: Never    Passive Exposure: Not on file  Transportation Needs: No Transportation Needs (03/15/2022)   PRAPARE - Transportation    Lack of Transportation (Medical): No    Lack of Transportation (Non-Medical): No     USPSTF grade A and B recommendations - reviewed and addressed today  Depression:  Phq 9 completed today by patient, was reviewed by me with patient in the room PHQ score is positive, pt feels still having depression and grief - stopped meds and working with therapist     03/15/2022   10:24 AM 01/26/2022    3:14 PM 11/26/2021   10:48 AM 11/23/2021    4:05 PM  PHQ 2/9 Scores  PHQ - 2 Score 2 2 4 2  PHQ- 9 Score 9 13 7 3      03/15/2022   10:24 AM 01/26/2022    3:14 PM 11/26/2021   10:48 AM 11/23/2021    4:05 PM 10/13/2021    9:10 AM  Depression screen PHQ 2/9  Decreased Interest 1 1 2 1 1  Down, Depressed, Hopeless 1 1 2 1 1  PHQ - 2 Score 2 2 4 2 2  Altered sleeping 3   3 2 0 0  Tired, decreased energy _0 0  Change in appetite 0 3 0 0 0  Feeling bad or failure about yourself  0 1 0 0 0  Trouble concentrating 1 1 0 0 0  Moving slowly or fidgety/restless 0 0 0 0 0  Suicidal thoughts 0 0 0 0 0  PHQ-9 Score _1 Difficult doing work/chores Somewhat difficult Not difficult at all Somewhat difficult Not difficult at all Not difficult at all   Anxiety with dental procedures - [facility]    Alcohol screening: [place] Office Visit from [facility]/15/20[facility]  AUDIT-C Score 0       Immunizations and Health Maintenance: Health Maintenance  Topic Date Due   Zoster Vaccines- Shingrix (1 of 2) Never done   COLONOSCOPY (Pts 45-17yr Insurance coverage will need to be confirmed)  01/16/2014   DEXA SCAN  Never done   INFLUENZA VACCINE  04/05/2022   MAMMOGRAM  07/08/2022    TETANUS/TDAP  05/17/2026   Pneumonia Vaccine 66 Years old  Completed   Hepatitis C Screening  Completed   HPV VACCINES  Aged Out   COVID-19 Vaccine  Discontinued     Hep C Screening: done  STD testing and prevention (HIV/chl/gon/syphilis):  see above, no additional testing desired by pt today   Intimate partner violence:denies  Sexual History/Pain during Intercourse: Widowed  Menstrual History/LMP/Abnormal Bleeding: denies AUB No LMP recorded. Patient has had a hysterectomy.  Incontinence Symptoms: none  Breast cancer: done Last Mammogram: *see HM list above BRCA gene screening:   Cervical cancer screening: aged out Pt  family hx of cancers - breast, ovarian, uterine, colon:     Osteoporosis:  due Discussion on osteoporosis per age, including high calcium and vitamin D supplementation, weight bearing exercises Pt is NOT supplementing with daily calcium/Vit D. Bone scan/dexa - due   Skin cancer:  Hx of skin CA -  NO Discussed atypical lesions   Colorectal cancer:   Colonoscopy is overdue - agrees to do cologuard Discussed concerning signs and sx of CRC, pt denies   Lung cancer:   Low Dose CT Chest recommended if Age 67-80years, 20 pack-year currently smoking OR have quit w/in 15years. Patient does not qualify.    Social History   Tobacco Use   Smoking status: Never   Smokeless tobacco: Never  Vaping Use   Vaping Use: Never used  Substance Use Topics   Alcohol use: Not Currently    Comment: occasional wine   Drug use: Never     [place] 0       Family History  Problem Relation Age of Onset   Breast cancer Paternal A33   Breast cancer Mother 436  Migraines Mother    Cancer Mother    Hypertension Mother    Diabetes Mother    Rectal cancer Father    Cancer Father    COPD Father    Alcohol abuse Father    Heart disease Paternal Grandmother    Stroke Paternal  Grandmother    Heart attack Paternal Grandmother    Aneurysm Paternal Grandfather    COPD Paternal Grandfather    Breast cancer Cousin    Breast cancer Cousin    Breast cancer Cousin    Diabetes Sister    Anesthesia problems Neg Hx  Blood pressure/Hypertension: BP Readings from Last 3 Encounters:  03/15/22 124/68  02/03/22 124/78  01/26/22 132/78    Weight/Obesity: Wt Readings from Last 3 Encounters:  03/15/22 230 lb 8 oz (104.6 kg)  02/03/22 233 lb 9.6 oz (106 kg)  01/26/22 231 lb (104.8 kg)   BMI Readings from Last 3 Encounters:  03/15/22 38.95 kg/m  02/03/22 39.48 kg/m  01/26/22 39.04 kg/m     Lipids:  Lab Results  Component Value Date   CHOL 190 03/15/2022   CHOL 193 09/30/2019   CHOL 169 08/08/2018   Lab Results  Component Value Date   HDL 48 (L) 03/15/2022   HDL 53 09/30/2019   HDL 43 (L) 08/08/2018   Lab Results  Component Value Date   LDLCALC 120 (H) 03/15/2022   LDLCALC 113 (H) 09/30/2019   LDLCALC 104 (H) 08/08/2018   Lab Results  Component Value Date   TRIG 116 03/15/2022   TRIG 152 (H) 09/30/2019   TRIG 120 08/08/2018   Lab Results  Component Value Date   CHOLHDL 4.0 03/15/2022   CHOLHDL 3.6 09/30/2019   CHOLHDL 3.9 08/08/2018   No results found for: "LDLDIRECT" Based on the results of lipid panel his/her cardiovascular risk factor ( using Aristes )  in the next 10 years is: The 10-year ASCVD risk score (Arnett DK, et al., 2019) is: 8.8%   Values used to calculate the score:     Age: 70 years     Sex: Female     Is Non-Hispanic African American: No     Diabetic: No     Tobacco smoker: No     Systolic Blood Pressure: 778 mmHg     Is BP treated: Yes     HDL Cholesterol: 48 mg/dL     Total Cholesterol: 190 mg/dL  Glucose:  Glucose  Date Value Ref Range Status  02/22/2022 99 70 - 99 mg/dL Final  03/30/2021 105 (H) 65 - 99 mg/dL Final  02/25/2021 114 (H) 65 - 99 mg/dL Final   Glucose, Bld  Date Value Ref Range  Status  03/15/2022 104 (H) 65 - 99 mg/dL Final    Comment:    .            Fasting reference interval . For someone without known diabetes, a glucose value between 100 and 125 mg/dL is consistent with prediabetes and should be confirmed with a follow-up test. .   09/07/2021 90 70 - 99 mg/dL Final    Comment:    Glucose reference range applies only to samples taken after fasting for at least 8 hours.  10/04/2019 112 (H) 65 - 99 mg/dL Final    Comment:    .            Fasting reference interval . For someone without known diabetes, a glucose value between 100 and 125 mg/dL is consistent with prediabetes and should be confirmed with a follow-up test. .     Advanced Care Planning:  A voluntary discussion about advance care planning including the explanation and discussion of advance directives.   Discussed health care proxy and Living will, and the patient was able to identify a health care proxy as Eneida Evers - her son.   Patient does not have a living will at present time.   Social History       Social History   Socioeconomic History   Marital status: Widowed    Spouse name: Not on file   Number of  children: 2   Years of education: 12   Highest education level: Some college, no degree  Occupational History   Occupation: retired  Tobacco Use   Smoking status: Never   Smokeless tobacco: Never  Vaping Use   Vaping Use: Never used  Substance and Sexual Activity   Alcohol use: Not Currently    Comment: occasional wine   Drug use: Never   Sexual activity: Not Currently    Birth control/protection: None  Other Topics Concern   Not on file  Social History Narrative   Not on file   Social Determinants of Health   Financial Resource Strain: Low Risk  (03/15/2022)   Overall Financial Resource Strain (CARDIA)    Difficulty of Paying Living Expenses: Not hard at all  Food Insecurity: No Food Insecurity (03/15/2022)   Hunger Vital Sign    Worried About Running Out  of Food in the Last Year: Never true    Ran Out of Food in the Last Year: Never true  Transportation Needs: No Transportation Needs (03/15/2022)   PRAPARE - Transportation    Lack of Transportation (Medical): No    Lack of Transportation (Non-Medical): No  Physical Activity: Sufficiently Active (03/15/2022)   Exercise Vital Sign    Days of Exercise per Week: 3 days    Minutes of Exercise per Session: 50 min  Stress: Stress Concern Present (03/15/2022)   [employer] - Occupational Stress Questionnaire    Feeling of Stress : To some extent  Social Connections: Moderately Integrated (03/15/2022)   Social Connection and Isolation Panel [NHANES]    Frequency of Communication with Friends and Family: Three times a week    Frequency of Social Gatherings with Friends and Family: Three times a week    Attends Religious Services: More than 4 times per year    Active Member of Clubs or Organizations: Yes    Attends Club or Organization Meetings: More than 4 times per year    Marital Status: Widowed    Family History        Family History  Problem Relation Age of Onset   Breast cancer Paternal Aunt    Breast cancer Mother 40   Migraines Mother    Cancer Mother    Hypertension Mother    Diabetes Mother    Rectal cancer Father    Cancer Father    COPD Father    Alcohol abuse Father    Heart disease Paternal Grandmother    Stroke Paternal Grandmother    Heart attack Paternal Grandmother    Aneurysm Paternal Grandfather    COPD Paternal Grandfather    Breast cancer Cousin    Breast cancer Cousin    Breast cancer Cousin    Diabetes Sister    Anesthesia problems Neg Hx     Patient Active Problem List   Diagnosis Date Noted   Pain in joint of left knee 12/21/2021   Derangement of left knee 12/21/2021   Chronic obstructive pulmonary disease (HCC) 01/22/2020   Stress incontinence 12/27/2019   Hyperlipidemia 12/27/2019   Muscle spasms of lower extremity  12/27/2019   Class 3 severe obesity with body mass index (BMI) of 40.0 to 44.9 in adult (HCC) 07/12/2017   Allergic rhinitis 06/26/2017   Hematuria 07/27/2016   Degenerative joint disease (DJD) of hip 03/31/2016   Hx of breast cancer 03/22/2016   History of pulmonary embolism 10/27/2015   HTN (hypertension) 10/07/2015   Depression, major, recurrent, moderate (HCC) 06/11/2015     Anemia 05/15/2015   Do not resuscitate status 05/15/2015   HFrEF (heart failure with reduced ejection fraction) ([place]) 05/05/2015   Left leg DVT ([place]) 05/05/2015   Pulmonary hypertension (HCC)    Saddle pulmonary embolus (HCC)    LBBB (left bundle branch block)-rate related  04/19/2015   Chronic migraine without aura, with intractable migraine, so stated, with status migrainosus 11/04/2014   Subscapularis (muscle) sprain 11/22/2013    Past Surgical History:  Procedure Laterality Date   ABDOMINAL HYSTERECTOMY  1986   ANAL FISSURE REPAIR     ANTERIOR CERVICAL DECOMP/DISCECTOMY FUSION  01/23/2012   Procedure: ANTERIOR CERVICAL DECOMPRESSION/DISCECTOMY FUSION 1 LEVEL;  Surgeon: Elaina Hoops, MD;  Location: [place] [facility];  Service: Neurosurgery;  Laterality: N/A;  Cervical five-six anterior cervical discectomy fusion with plating   APPENDECTOMY     BREAST BIOPSY Left 06/2016   US biopsy-PAPILLARY CYSTIC APOCRINE CHANGE    BREAST SURGERY Left 2011   Breast lumpectomy   CARDIAC CATHETERIZATION  2010   [facility]; pt states it was "clear"   CARDIAC CATHETERIZATION N/A 10/28/2015   Procedure: Right/Left Heart Cath and Coronary Angiography;  Surgeon: Belva Crome, MD;  Location: [place] [facility];  Service: Cardiovascular;  Laterality: N/A;   CARPAL TUNNEL RELEASE Bilateral    [place], 1979   CHOLECYSTECTOMY     COLONOSCOPY  01/16/2009   Dr Bary Castilla   KNEE CARTILAGE SURGERY Left    SHOULDER ARTHROSCOPY WITH SUBACROMIAL DECOMPRESSION, ROTATOR CUFF REPAIR AND BICEP TENDON REPAIR Right 11/22/2013    Procedure: RIGHT SHOULDER ATHROSCOPY OPEN SUBSCAP REPAIR DELTA-PECTORAL ;  Surgeon: Augustin Schooling, MD;  Location: [facility];  Service: Orthopedics;  Laterality: Right;   TARSAL TUNNEL RELEASE Left 1990   TONSILLECTOMY     TUBAL LIGATION       Current Outpatient Medications:    albuterol (VENTOLIN HFA) 108 (90 Base) MCG/ACT inhaler, Inhale 2 puffs into the lungs every 6 (six) hours as needed for wheezing or shortness of breath., Disp: 18 g, Rfl: 2   carvedilol (COREG) 6.25 MG tablet, TAKE 1 TABLET BY MOUTH TWICE A DAY, Disp: 180 tablet, Rfl: 3   cetirizine (ZYRTEC) 10 MG tablet, Take 10 mg by mouth daily., Disp: , Rfl:    sacubitril-valsartan (ENTRESTO) 24-26 MG, Take 1 tablet by mouth 2 (two) times daily., Disp: 60 tablet, Rfl: 11   spironolactone (ALDACTONE) 25 MG tablet, Take 1 tablet (25 mg total) by mouth daily., Disp: 90 tablet, Rfl: 3   tizanidine (ZANAFLEX) 2 MG capsule, Take 2 mg by mouth 3 (three) times daily as needed for muscle spasms., Disp: , Rfl:    traMADol (ULTRAM) 50 MG tablet, Take 50 mg by mouth every 4 (four) hours as needed., Disp: , Rfl:    TRELEGY ELLIPTA 100-62.5-25 MCG/ACT AEPB, INHALE 1 PUFF BY MOUTH EVERY DAY, Disp: 84 each, Rfl: 1   vitamin B-12 (CYANOCOBALAMIN) 500 MCG tablet, Take 2,500 mcg by mouth daily., Disp: , Rfl:    vitamin C (ASCORBIC ACID) 500 MG tablet, Take 500 mg by mouth daily., Disp: , Rfl:    XARELTO 10 MG TABS tablet, TAKE 1 TABLET BY MOUTH EVERY DAY, Disp: 90 tablet, Rfl: 1   Zoster Vaccine Adjuvanted (SHINGRIX) injection, Inject 0.5 mLs into the muscle once for 1 dose. And repeat once more in 2-6 months, Disp: 0.5 mL, Rfl: 1   fluticasone (FLONASE) 50 MCG/ACT nasal spray, Place 2 sprays into both nostrils daily., Disp: 48 mL, Rfl:  1   LORazepam (ATIVAN) 1 MG tablet, Take 1 tablet (1 mg total) by mouth once as needed for up to 1 dose for anxiety (for procedural anxiety - take one hour prior to appt)., Disp: 5 tablet, Rfl: 0  Allergies  Allergen  Reactions   Gadolinium Derivatives Shortness Of Breath and Nausea And Vomiting   Levaquin [Levofloxacin In D5w] Itching    Was giving with vancomycin at same time - unsure which one pt had a reaction to   Sulfa Antibiotics Swelling    "Ears swelled up like [place]"   Vancomycin Itching    Was giving at same time as Levaquin - unsure which pt had reaction to   Aripiprazole Other (See Comments)    Dry mouth with sores    Levofloxacin Itching   Codeine Itching and Rash   Oxycodone Itching   Penicillins Hives    Has patient had a PCN reaction causing immediate rash, facial/tongue/throat swelling, SOB or lightheadedness with hypotension: Yes Has patient had a PCN reaction causing severe rash involving mucus membranes or skin necrosis: No Has patient had a PCN reaction that required hospitalization No Has patient had a PCN reaction occurring within the last 10 years: No If all of the above answers are "NO", then may proceed with Cephalosporin use.    Patient Care Team: , , PA-C as PCP - General (Family Medicine) Smith, Henry W, MD as PCP - Cardiology (Cardiology) Smith, Henry W, MD as Consulting Physician (Cardiology) Bensimhon, Daniel R, MD as Consulting Physician (Cardiology)   Chart Review: I personally reviewed active problem list, medication list, allergies, family history, social history, health maintenance, notes from last encounter, lab results, imaging with the patient/caregiver today.   Review of Systems  Constitutional: Negative.   HENT: Negative.    Eyes: Negative.   Respiratory: Negative.    Cardiovascular: Negative.   Gastrointestinal: Negative.   Endocrine: Negative.   Genitourinary: Negative.   Musculoskeletal: Negative.   Skin: Negative.   Allergic/Immunologic: Negative.   Neurological: Negative.   Hematological: Negative.   Psychiatric/Behavioral: Negative.    All other systems reviewed and are negative.         Objective:   Vitals:  Vitals:    03/15/22 1029  BP: 124/68  Pulse: 87  Resp: 16  Temp: 98 F (36.7 C)  TempSrc: Oral  SpO2: 95%  Weight: 230 lb 8 oz (104.6 kg)  Height: 5' 4.5" (1.638 m)    Body mass index is 38.95 kg/m.  Physical Exam Vitals and nursing note reviewed.  Constitutional:      General: She is not in acute distress.    Appearance: Normal appearance. She is well-developed. She is obese. She is not ill-appearing, toxic-appearing or diaphoretic.     Interventions: Face mask in place.  HENT:     Head: Normocephalic and atraumatic.     Right Ear: External ear normal.     Left Ear: External ear normal.  Eyes:     General: Lids are normal. No scleral icterus.       Right eye: No discharge.        Left eye: No discharge.     Conjunctiva/sclera: Conjunctivae normal.  Neck:     Trachea: Phonation normal. No tracheal deviation.  Cardiovascular:     Rate and Rhythm: Normal rate and regular rhythm.     Pulses: Normal pulses.          Radial pulses are 2+ on the right side and 2+ on   the left side.       Posterior tibial pulses are 2+ on the right side and 2+ on the left side.     Heart sounds: Normal heart sounds. No murmur heard.    No friction rub. No gallop.  Pulmonary:     Effort: Pulmonary effort is normal. No respiratory distress.     Breath sounds: Normal breath sounds. No stridor. No wheezing, rhonchi or rales.  Chest:     Chest wall: No tenderness.  Abdominal:     General: Bowel sounds are normal. There is no distension.     Palpations: Abdomen is soft.  Musculoskeletal:     Right lower leg: No edema.     Left lower leg: No edema.  Skin:    General: Skin is warm and dry.     Coloration: Skin is not jaundiced or pale.     Findings: No rash.  Neurological:     Mental Status: She is alert.     Motor: No abnormal muscle tone.     Gait: Gait normal.  Psychiatric:        Attention and Perception: Attention normal.        Mood and Affect: Mood is depressed. Affect is tearful.         Speech: Speech normal.        Behavior: Behavior normal. Behavior is cooperative.       Fall Risk:    03/15/2022   10:24 AM 01/26/2022    3:12 PM 11/26/2021   10:48 AM 11/23/2021    4:13 PM 10/13/2021    9:10 AM  Fall Risk   Falls in the past year? 0 0 0 0 0  Number falls in past yr: 0  0 0 0  Injury with Fall? 0  0 0 0  Risk for fall due to : No Fall Risks No Fall Risks No Fall Risks No Fall Risks   Follow up Falls prevention discussed;Education provided Falls prevention discussed Falls prevention discussed Falls prevention discussed     Functional Status Survey: Is the patient deaf or have difficulty hearing?: No Does the patient have difficulty seeing, even when wearing glasses/contacts?: No Does the patient have difficulty concentrating, remembering, or making decisions?: No Does the patient have difficulty walking or climbing stairs?: No Does the patient have difficulty dressing or bathing?: No Does the patient have difficulty doing errands alone such as visiting a doctor's office or shopping?: No   Assessment & Plan:    CPE completed today  USPSTF grade A and B recommendations reviewed with patient; age-appropriate recommendations, preventive care, screening tests, etc discussed and encouraged; healthy living encouraged; see AVS for patient education given to patient  Discussed importance of 150 minutes of physical activity weekly, AHA exercise recommendations given to pt in AVS/handout  Discussed importance of healthy diet:  eating lean meats and proteins, avoiding trans fats and saturated fats, avoid simple sugars and excessive carbs in diet, eat 6 servings of fruit/vegetables daily and drink plenty of water and avoid sweet beverages.    Recommended pt to do annual eye exam and routine dental exams/cleanings  Depression, alcohol, fall screening completed as documented above and per flowsheets  Advance Care planning information and packet discussed and offered today,  encouraged pt to discuss with family members/spouse/partner/friends and complete Advanced directive packet and bring copy to office   Reviewed Health Maintenance: Health Maintenance  Topic Date Due   Zoster Vaccines- Shingrix (1 of 2) Never done     COLONOSCOPY (Pts 45-49yrs Insurance coverage will need to be confirmed)  01/16/2014   DEXA SCAN  Never done   INFLUENZA VACCINE  04/05/2022   MAMMOGRAM  07/08/2022   TETANUS/TDAP  05/17/2026   Pneumonia Vaccine 65+ Years old  Completed   Hepatitis C Screening  Completed   HPV VACCINES  Aged Out   COVID-19 Vaccine  Discontinued    Immunizations: Immunization History  Administered Date(s) Administered   Influenza Inj Mdck Quad Pf 06/21/2019   Influenza,inj,Quad PF,6+ Mos 07/01/2015, 05/17/2016, 06/26/2017, 08/08/2018   PNEUMOCOCCAL CONJUGATE-20 02/16/2021   Pneumococcal Polysaccharide-23 07/13/2007   Td 09/05/2004   Tdap 05/17/2016   Vaccines:  HPV: up to at age 26 , ask insurance if age between 27-45  Shingrix: 50-64 yo and ask insurance if covered when patient above 65 yo Pneumonia: complete Flu: out of season     ICD-10-CM   1. Annual physical exam  Z00.00 CBC with Differential/Platelet    COMPLETE METABOLIC PANEL WITH GFR    Lipid panel    2. Hyperlipidemia, unspecified hyperlipidemia type  E78.5 COMPLETE METABOLIC PANEL WITH GFR    Lipid panel   on statin    3. Primary hypertension  I10 COMPLETE METABOLIC PANEL WITH GFR   BP controlled, stable    4. Postmenopausal estrogen deficiency  Z78.0 DG Bone Density    5. Class 3 severe obesity due to excess calories with serious comorbidity and body mass index (BMI) of 40.0 to 44.9 in adult (HCC)  E66.01 CBC with Differential/Platelet   Z68.41 COMPLETE METABOLIC PANEL WITH GFR    Lipid panel    6. Recurrent pulmonary emboli (HCC) Chronic I26.99    on NOAC    7. Chronic obstructive pulmonary disease, unspecified COPD type (HCC) Chronic J44.9    stable sx    8. Depression,  major, recurrent, moderate (HCC) Chronic F33.1    MDD/grief and increased anxiety, she wishes to stop all meds, nothing is helping she is working with a therapist    9. Seasonal allergic rhinitis, unspecified trigger  J30.2 fluticasone (FLONASE) 50 MCG/ACT nasal spray   med refill    10. Screening for colon cancer  Z12.11 Cologuard    CANCELED: Ambulatory referral to Gastroenterology    11. Anxiety attack  F41.0 LORazepam (ATIVAN) 1 MG tablet   controlled substance for procedural anxiety - only for dentist visit/procedure, with ride     12. Need for shingles vaccine  Z23 Zoster Vaccine Adjuvanted (SHINGRIX) injection      Obesity - previously discussed - meds sent into pharmacy - pt is going to check with them for supply and insurance coverage and let us know if able to start meds - advised a 1-2 month f/up after starting meds to help with dose increase and monitoring SE, and then 4 month f/up to assess affectiveness - she verbalized understanding     , PA-C 03/15/22 7:28 PM  [facility] 

## 2022-03-17 DIAGNOSIS — M179 Osteoarthritis of knee, unspecified: Secondary | ICD-10-CM | POA: Insufficient documentation

## 2022-03-22 NOTE — Progress Notes (Signed)
Cardiology Office Note:    Date:  03/24/2022   ID:  Patricia Fischer, DOB Jan 25, 1956, MRN [id number]  PCP:  Delsa Grana, PA-C  Cardiologist:  Sinclair Grooms, MD   Referring MD: Delsa Grana, PA-C   Chief Complaint  Patient presents with   Congestive Heart Failure   Hyperlipidemia   Coronary Artery Disease    History of Present Illness:    Patricia Fischer is a 66 y.o. female with a hx of  pulmonary embolism, chronic systolic heart failure, EF 52% by MRI (2018, December) -->25-30%, 02/12/2021 --> 20-25%, 10/2021), primary hypertension, nonobstructive coronary atherosclerosis by Cardiac catheterization 2017, and chronic anticoagulation therapy.     She feels somewhat better.  She had urinary tract infection on Farxiga.  She is now on 3 drug therapy including spironolactone, Entresto, and beta-blocker.  She has lost weight.  She is not eating properly, choosing the convenience of fast food as opposed to home prepared vegetables of lean low carbohydrate dishes.  Past Medical History:  Diagnosis Date   Allergy    Anxiety    Asthma    Blood transfusion 1979   Chidbirth   Breast cancer, left breast ([place]) 2011   DCIS   Chronic systolic CHF w/ Improved LVF 05/05/2015   NICM // Dx 04/2015 - EF initially 25-30% with RV dysfunction // f/u echo 08/2015 technically difficult, EF 35-40%, anterior, anteroseptal, apical and inferoapical severe hypokinesis suggestive of LAD territory ischemia/infarct, grade 1 DD, mild MR, RV normal. // Echo 5/18: mild LVH, EF 30-35, Gr 1 DD // cMRI 12/18: EF 52    COPD (chronic obstructive pulmonary disease) (HCC)    Depression    DVT (deep venous thrombosis) ([place]) 04/2015   a. Dx 04/2015 - patient returned 05/2015 after noncompliance with Xarelto.   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 Dx 04/2015 - patient returned 05/2015 after noncompliance with Xarelto.   Shortness of breath dyspnea    Syncope    a. Recurrent syncope in 2016 felt 2/2 orthostasis in setting of PE.    Past Surgical History:  Procedure Laterality Date   ABDOMINAL HYSTERECTOMY  1986   ANAL FISSURE REPAIR     ANTERIOR CERVICAL DECOMP/DISCECTOMY FUSION  01/23/2012   Procedure: ANTERIOR CERVICAL DECOMPRESSION/DISCECTOMY FUSION 1 LEVEL;  Surgeon: Elaina Hoops, MD;  Location: [facility];  Service: Neurosurgery;  Laterality: N/A;  Cervical five-six anterior cervical discectomy fusion with plating   APPENDECTOMY     BREAST BIOPSY Left 06/2016   US biopsy-PAPILLARY CYSTIC APOCRINE CHANGE    BREAST SURGERY Left 2011   Breast lumpectomy   CARDIAC CATHETERIZATION  2010   [facility]; pt states it was "clear"   CARDIAC CATHETERIZATION N/A 10/28/2015   Procedure: Right/Left Heart Cath and Coronary Angiography;  Surgeon: Belva Crome, MD;  Location: [facility];  Service: Cardiovascular;  Laterality: N/A;   CARPAL TUNNEL RELEASE Bilateral  [place]   CHOLECYSTECTOMY     COLONOSCOPY  01/16/2009   Dr Bary Castilla   KNEE CARTILAGE SURGERY Left    SHOULDER ARTHROSCOPY WITH SUBACROMIAL DECOMPRESSION, ROTATOR CUFF REPAIR AND BICEP TENDON REPAIR Right 11/22/2013   Procedure: RIGHT SHOULDER ATHROSCOPY OPEN SUBSCAP REPAIR DELTA-PECTORAL ;  Surgeon: [place] Schooling, MD;  Location: [facility];  Service: Orthopedics;  Laterality: Right;   TARSAL TUNNEL RELEASE Left 1990   TONSILLECTOMY     TUBAL LIGATION      Current Medications: Current Meds  Medication Sig   albuterol (VENTOLIN HFA) 108 (90 Base) MCG/ACT inhaler Inhale 2  puffs into the lungs every 6 (six) hours as needed for wheezing or shortness of breath.   carvedilol (COREG) 6.25 MG tablet TAKE 1 TABLET BY MOUTH TWICE A DAY   cetirizine (ZYRTEC) 10 MG tablet Take 10 mg by mouth daily.   fluticasone (FLONASE) 50 MCG/ACT nasal spray Place 2 sprays into both nostrils daily.   LORazepam (ATIVAN) 1 MG tablet Take 1 tablet (1 mg total) by mouth once as needed for up to 1 dose for anxiety (for procedural anxiety - take one hour prior to appt).   rosuvastatin (CRESTOR) 20 MG tablet Take 1 tablet (20 mg total) by mouth daily.   spironolactone (ALDACTONE) 25 MG tablet Take 1 tablet (25 mg total) by mouth daily.   tizanidine (ZANAFLEX) 2 MG capsule Take 2 mg by mouth 3 (three) times daily as needed for muscle spasms.   traMADol (ULTRAM) 50 MG tablet Take 50 mg by mouth every 4 (four) hours as needed.   TRELEGY ELLIPTA 100-62.5-25 MCG/ACT AEPB INHALE 1 PUFF BY MOUTH EVERY DAY   vitamin B-12 (CYANOCOBALAMIN) 500 MCG tablet Take 2,500 mcg by mouth daily.   vitamin C (ASCORBIC ACID) 500 MG tablet Take 500 mg by mouth daily.   XARELTO 10 MG TABS tablet TAKE 1 TABLET BY MOUTH EVERY DAY   [DISCONTINUED] sacubitril-valsartan (ENTRESTO) 24-26 MG Take 1 tablet by mouth 2 (two) times daily.     Allergies:   Gadolinium derivatives, Levaquin [levofloxacin in d5w], Sulfa antibiotics, Vancomycin, Aripiprazole, Levofloxacin, Codeine, Oxycodone, and Penicillins   Social History   Socioeconomic History   Marital status: Widowed    Spouse name: Not on file   Number of children: 2   Years of education: 82   Highest education level: Some college, no degree  Occupational History   Occupation: retired  Tobacco Use   Smoking status: Never   Smokeless tobacco: Never  Vaping Use   Vaping Use: Never used  Substance and Sexual Activity   Alcohol use: Not Currently    Comment: occasional wine   Drug use: Never   Sexual activity: Not Currently    Birth control/protection: None   Other Topics Concern   Not on file  Social History Narrative   Not on file   Social Determinants of Health   Financial Resource Strain: Low Risk  (03/15/2022)   Overall Financial Resource Strain (CARDIA)    Difficulty of Paying Living Expenses: Not hard at all  Food Insecurity: No Food Insecurity (03/15/2022)   Hunger Vital Sign    Worried About Running Out of Food in the Last Year: Never true    [place] in the Last Year: Never true  Transportation Needs: No Transportation Needs (03/15/2022)   PRAPARE - Hydrologist (Medical): No    Lack of Transportation (Non-Medical): No  Physical Activity: Sufficiently  Active (03/15/2022)   Exercise Vital Sign    Days of Exercise per Week: 3 days    Minutes of Exercise per Session: 50 min  Stress: Stress Concern Present (03/15/2022)   [place]    Feeling of Stress : To some extent  Social Connections: Moderately Integrated (03/15/2022)   Social Connection and Isolation Panel [NHANES]    Frequency of Communication with Friends and Family: Three times a week    Frequency of Social Gatherings with Friends and Family: Three times a week    Attends Religious Services: More than 4 times per year    Active Member of Clubs or Organizations: Yes    Attends Archivist Meetings: More than 4 times per year    Marital Status: Widowed     Family History: The patient's family history includes Alcohol abuse in her father; Aneurysm in her paternal grandfather; Breast cancer in her cousin, cousin, cousin, and paternal aunt; Breast cancer (age of onset: 52) in her mother; COPD in her father and paternal grandfather; Cancer in her father and mother; Diabetes in her mother and sister; Heart attack in her paternal grandmother; Heart disease in her paternal grandmother; Hypertension in her mother; Migraines in her mother; Rectal cancer in her father; Stroke  in her paternal grandmother. There is no history of Anesthesia problems.  ROS:   Please see the history of present illness.    Concerned about her cardiac mortality.  Wonders if she should be treated for elevated lipids.  All other systems reviewed and are negative.  EKGs/Labs/Other Studies Reviewed:    The following studies were reviewed today: ECHOCARDIOGRAM 2023 IMPRESSIONS     1. Basal to mid inferoseptal akinesis. Left ventricular ejection  fraction, by estimation, is 20 to 25%. Left ventricular ejection fraction  by PLAX is 42 %. The left ventricle has severely decreased function. The  left ventricle demonstrates regional wall   motion abnormalities (see scoring diagram/findings for description). The  left ventricular internal cavity size was moderately dilated. Left  ventricular diastolic parameters are consistent with Grade I diastolic  dysfunction (impaired relaxation).  Elevated left ventricular end-diastolic pressure.   2. Right ventricular systolic function is normal. The right ventricular  size is normal.   3. The mitral valve is normal in structure. Trivial mitral valve  regurgitation. No evidence of mitral stenosis.   4. The aortic valve is normal in structure. Aortic valve regurgitation is  not visualized. No aortic stenosis is present.   5. Aortic dilatation noted. There is mild dilatation of the ascending  aorta, measuring 38 mm.   6. The inferior vena cava is normal in size with greater than 50%  respiratory variability, suggesting right atrial pressure of 3 mmHg.   EKG:  EKG not repeated  Recent Labs: 09/07/2021: B Natriuretic Peptide 140.4 03/15/2022: ALT 13; BUN 11; Creat 0.88; Hemoglobin 14.4; Platelets 192; Potassium 4.6; Sodium 139  Recent Lipid Panel    Component Value Date/Time   CHOL 190 03/15/2022 1112   CHOL 193 09/30/2019 0810   TRIG 116 03/15/2022 1112   HDL 48 (L) 03/15/2022 1112   HDL 53 09/30/2019 0810   CHOLHDL 4.0 03/15/2022 1112   VLDL  18 05/17/2016 1454   LDLCALC 120 (H) 03/15/2022 1112    Physical Exam:    VS:  BP 118/68   Pulse 87   Ht 5' 4.5" (1.638 m)   Wt 232 lb (105.2 kg)   SpO2 97%  BMI 39.21 kg/m     Wt Readings from Last 3 Encounters:  03/24/22 232 lb (105.2 kg)  03/15/22 230 lb 8 oz (104.6 kg)  02/03/22 233 lb 9.6 oz (106 kg)     GEN: Morbid. No acute distress HEENT: Normal NECK: No JVD. LYMPHATICS: No lymphadenopathy CARDIAC: No murmur. RRR no gallop, or edema. VASCULAR: Normal Pulses. No bruits. RESPIRATORY:  Clear to auscultation without rales, wheezing or rhonchi  ABDOMEN: Soft, non-tender, non-distended, No pulsatile mass, MUSCULOSKELETAL: No deformity  SKIN: Warm and dry NEUROLOGIC:  Alert and oriented x 3 PSYCHIATRIC:  Normal affect   ASSESSMENT:    1. Chronic systolic heart failure ([place])   2. LBBB (left bundle branch block)-rate related    3. Essential hypertension   4. Pulmonary hypertension ([place])   5. Chronic anticoagulation   6. Morbid obesity ([place])   7. Hyperlipidemia, unspecified hyperlipidemia type    PLAN:    In order of problems listed above:  She is on 3 drug therapy including Entresto, spironolactone, and carvedilol.  Relatively low blood pressures have been an issue.  Patricia Fischer caused urinary tract infection.  2D Doppler echocardiogram in February 2023.  Clinical follow-up in March. Was not reassessed today. Continue current therapy that includes her heart failure regimen Lifelong Xarelto therapy because of saddle pulmonary emboli Xarelto Continue efforts to lose weight Coronary angiography in 2017 revealed mild nonobstructive coronary disease.  She therefore does need management of lipids to LDL less than 70.  He is starting rosuvastatin 20 mg/day.  Lipid panel and c-Met in 6 weeks.  Guideline directed therapy for left ventricular systolic dysfunction: Angiotensin receptor-neprilysin inhibitor (ARNI)-Entresto; beta-blocker therapy - carvedilol, metoprolol  succinate, or bisoprolol; mineralocorticoid receptor antagonist (MRA) therapy -spironolactone or eplerenone.  SGLT-2 agents -  Dapagliflozin Patricia Fischer) or Empagliflozin ([place]).These therapies have been shown to improve clinical outcomes including reduction of rehospitalization, survival, and acute heart failure.  Will turn her care over to Dr. Candee Furbish who will see her 1 year from now.  Medication Adjustments/Labs and Tests Ordered: Current medicines are reviewed at length with the patient today.  Concerns regarding medicines are outlined above.  Orders Placed This Encounter  Procedures   Lipid panel   Comprehensive metabolic panel   ECHOCARDIOGRAM COMPLETE   Meds ordered this encounter  Medications   sacubitril-valsartan (ENTRESTO) 24-26 MG    Sig: Take 1 tablet by mouth 2 (two) times daily.    Dispense:  180 tablet    Refill:  3    Please Honor Card patient is presenting for Carmie Kanner: [id number]; Juanna Cao: [id number]; QPYPP: OHS; JKDT: [id number] Pt. Requests 90 days supply.   rosuvastatin (CRESTOR) 20 MG tablet    Sig: Take 1 tablet (20 mg total) by mouth daily.    Dispense:  90 tablet    Refill:  3    Patient Instructions  Medication Instructions:  Your physician has recommended you make the following change in your medication:   1) START Rosuvastatin (Crestor) 65m daily  *If you need a refill on your cardiac medications before your next appointment, please call your pharmacy*  Lab Work: In 3 months: fasting Lipid panel, CMET If you have labs (blood work) drawn today and your tests are completely normal, you will receive your results only by: [place](if you have MyChart) OR A paper copy in the mail If you have any lab test that is abnormal or we need to change your treatment, we will call you to review the results.  Testing/Procedures: Your physician has requested that you have an echocardiogram in February 2024. Echocardiography is a painless test that  uses sound waves to create images of your heart. It provides your doctor with information about the size and shape of your heart and how well your heart's chambers and valves are working. This procedure takes approximately one hour. There are no restrictions for this procedure.\  Follow-Up: At [facility], you and your health needs are our priority.  As part of our continuing mission to provide you with exceptional heart care, we have created designated Provider Care Teams.  These Care Teams include your primary Cardiologist (physician) and Advanced Practice Providers (APPs -  Physician Assistants and Nurse Practitioners) who all work together to provide you with the care you need, when you need it.  Your next appointment:   7-8 month(s) (after echocardiogram) 1 year with Dr. Candee Furbish  The format for your next appointment:   In Person  Provider:   Sinclair Grooms, MD {  Important Information About Sugar         Signed, Sinclair Grooms, MD  03/24/2022 12:09 PM    [place]

## 2022-03-24 ENCOUNTER — Other Ambulatory Visit: Payer: Self-pay

## 2022-03-24 ENCOUNTER — Encounter: Payer: Self-pay | Admitting: Interventional Cardiology

## 2022-03-24 ENCOUNTER — Ambulatory Visit: Payer: Medicare Other | Admitting: Interventional Cardiology

## 2022-03-24 VITALS — BP 118/68 | HR 87 | Ht 64.5 in | Wt 232.0 lb

## 2022-03-24 DIAGNOSIS — I272 Pulmonary hypertension, unspecified: Secondary | ICD-10-CM | POA: Diagnosis not present

## 2022-03-24 DIAGNOSIS — I5022 Chronic systolic (congestive) heart failure: Secondary | ICD-10-CM

## 2022-03-24 DIAGNOSIS — I10 Essential (primary) hypertension: Secondary | ICD-10-CM | POA: Diagnosis not present

## 2022-03-24 DIAGNOSIS — E785 Hyperlipidemia, unspecified: Secondary | ICD-10-CM

## 2022-03-24 DIAGNOSIS — I447 Left bundle-branch block, unspecified: Secondary | ICD-10-CM

## 2022-03-24 DIAGNOSIS — Z7901 Long term (current) use of anticoagulants: Secondary | ICD-10-CM

## 2022-03-24 MED ORDER — ROSUVASTATIN CALCIUM 20 MG PO TABS: 20.0000 mg | ORAL_TABLET | Freq: Every day | ORAL | 3 refills | Status: DC

## 2022-03-24 MED ORDER — SACUBITRIL-VALSARTAN 24-26 MG PO TABS: 1.0000 | ORAL_TABLET | Freq: Two times a day (BID) | ORAL | 3 refills | Status: DC

## 2022-03-24 NOTE — Patient Instructions (Addendum)
Medication Instructions:  Your physician has recommended you make the following change in your medication:   1) START Rosuvastatin (Crestor) '20mg'$  daily  *If you need a refill on your cardiac medications before your next appointment, please call your pharmacy*  Lab Work: In 3 months: fasting Lipid panel, CMET If you have labs (blood work) drawn today and your tests are completely normal, you will receive your results only by: [place] (if you have MyChart) OR A paper copy in the mail If you have any lab test that is abnormal or we need to change your treatment, we will call you to review the results.  Testing/Procedures: Your physician has requested that you have an echocardiogram in February 2024. Echocardiography is a painless test that uses sound waves to create images of your heart. It provides your doctor with information about the size and shape of your heart and how well your heart's chambers and valves are working. This procedure takes approximately one hour. There are no restrictions for this procedure.\  Follow-Up: At [facility], you and your health needs are our priority.  As part of our continuing mission to provide you with exceptional heart care, we have created designated Provider Care Teams.  These Care Teams include your primary Cardiologist (physician) and Advanced Practice Providers (APPs -  Physician Assistants and Nurse Practitioners) who all work together to provide you with the care you need, when you need it.  Your next appointment:   7-8 month(s) (after echocardiogram) 1 year with Dr. Candee Furbish  The format for your next appointment:   In Person  Provider:   Sinclair Grooms, MD {  Important Information About Sugar

## 2022-03-25 ENCOUNTER — Other Ambulatory Visit: Payer: Self-pay | Admitting: Interventional Cardiology

## 2022-03-30 ENCOUNTER — Other Ambulatory Visit: Payer: Self-pay | Admitting: Family Medicine

## 2022-03-30 DIAGNOSIS — J441 Chronic obstructive pulmonary disease with (acute) exacerbation: Secondary | ICD-10-CM

## 2022-04-05 LAB — COLOGUARD: COLOGUARD: POSITIVE — AB

## 2022-04-06 NOTE — Addendum Note (Signed)
Addended by: Delsa Grana on: 04/06/2022 10:26 AM   Modules accepted: Orders

## 2022-04-13 ENCOUNTER — Telehealth: Payer: Self-pay

## 2022-04-13 NOTE — Telephone Encounter (Signed)
Patients call has been returned in regards to her colonoscopy referral.  She stated that she may need to have knee replacement surgery and has an appt scheduled to see the orthopedist next week.  She plans to let her ortho doctor know that she needs to have a colonoscopy and see which he thinks should be done first.  I asked her to call the office back when she is ready to get her colonoscopy scheduled.  She is on Xarelto and her cardiologist is Dr. Tamala Julian.  Thanks,  [place], [place]

## 2022-04-14 ENCOUNTER — Ambulatory Visit
Admission: RE | Admit: 2022-04-14 | Discharge: 2022-04-14 | Disposition: A | Discharge status: Home / Self Care | Payer: Medicare Other | Source: Ambulatory Visit | Attending: Family Medicine | Admitting: Family Medicine

## 2022-04-14 DIAGNOSIS — Z780 Asymptomatic menopausal state: Secondary | ICD-10-CM | POA: Diagnosis present

## 2022-05-02 ENCOUNTER — Other Ambulatory Visit: Payer: Self-pay | Admitting: Interventional Cardiology

## 2022-05-03 NOTE — Telephone Encounter (Signed)
Prescription refill request for Xarelto received.  Indication:DVT Last office visit:7/23 Weight:105.2 kg Age:66 Scr:0.8 CrCl:113.33 ml/min  Prescription refilled

## 2022-06-08 ENCOUNTER — Telehealth: Payer: Self-pay | Admitting: Interventional Cardiology

## 2022-06-08 NOTE — Telephone Encounter (Signed)
Pt c/o swelling: STAT is pt has developed SOB within 24 hours  If swelling, where is the swelling located? Feet and leg  How much weight have you gained and in what time span? she does nott think so  Have you gained 3 pounds in a day or 5 pounds in a week?   Do you have a log of your daily weights (if so, list)?   Are you currently taking a fluid pill? yes  Are you currently SOB? She is always short of breath,  Have you traveled recently? She is in [place] at this time

## 2022-06-08 NOTE — Telephone Encounter (Signed)
Returned call to patient.  She states her feet and legs began swelling on Monday 06/06/22. Denies any pain, no discoloration. States she has chronic SOB, no worse since her last OV in July. She reports left leg is more swollen than the right. She reports swelling goes down overnight when sleeping.  Patient states she is currently at [facility] this week. Denies increased salt in diet. States she has been drinking more tea and coffee vs water.  She reports urine output is "not as good as I think it should be." Currently taking spironolactone '25mg'$  QD.  Patient also states Dr. Tamala Julian wanted her to have lab work done in October to check kidney function. Per AVS for last OV note on 03/24/2022: patient to have fasting lipid panel and CMET drawn. Orders were released to [place] to have drawn in [place].  Will forward message to Dr. Tamala Julian to review and advise on swelling.

## 2022-06-10 NOTE — Telephone Encounter (Signed)
Take furosemide 40 mg po once. Give her 10 tablets because she may need more doses.

## 2022-06-14 ENCOUNTER — Ambulatory Visit: Payer: Self-pay

## 2022-06-14 ENCOUNTER — Encounter: Payer: Self-pay | Admitting: Family Medicine

## 2022-06-14 ENCOUNTER — Ambulatory Visit (INDEPENDENT_AMBULATORY_CARE_PROVIDER_SITE_OTHER): Payer: Medicare Other | Admitting: Family Medicine

## 2022-06-14 VITALS — BP 128/80 | HR 95 | Temp 98.2°F | Resp 16 | Ht 64.5 in | Wt 239.9 lb

## 2022-06-14 DIAGNOSIS — R600 Localized edema: Secondary | ICD-10-CM

## 2022-06-14 DIAGNOSIS — Z7901 Long term (current) use of anticoagulants: Secondary | ICD-10-CM

## 2022-06-14 DIAGNOSIS — I5020 Unspecified systolic (congestive) heart failure: Secondary | ICD-10-CM | POA: Diagnosis not present

## 2022-06-14 DIAGNOSIS — U071 COVID-19: Secondary | ICD-10-CM

## 2022-06-14 DIAGNOSIS — Z86711 Personal history of pulmonary embolism: Secondary | ICD-10-CM

## 2022-06-14 DIAGNOSIS — J441 Chronic obstructive pulmonary disease with (acute) exacerbation: Secondary | ICD-10-CM | POA: Diagnosis not present

## 2022-06-14 DIAGNOSIS — Z86718 Personal history of other venous thrombosis and embolism: Secondary | ICD-10-CM

## 2022-06-14 DIAGNOSIS — J069 Acute upper respiratory infection, unspecified: Secondary | ICD-10-CM

## 2022-06-14 MED ORDER — FUROSEMIDE 40 MG PO TABS: 40.0000 mg | ORAL_TABLET | Freq: Every day | ORAL | 0 refills | Status: DC

## 2022-06-14 MED ORDER — PREDNISONE 20 MG PO TABS: 40.0000 mg | ORAL_TABLET | Freq: Every day | ORAL | 0 refills | Status: AC

## 2022-06-14 MED ORDER — NIRMATRELVIR/RITONAVIR (PAXLOVID)TABLET: 3.0000 | ORAL_TABLET | Freq: Two times a day (BID) | ORAL | 0 refills | Status: AC

## 2022-06-14 NOTE — Telephone Encounter (Signed)
  Chief Complaint: COVID + , Also very swollen ankles, red blotchy legs Symptoms: ibid Frequency: 06/10/2022 Pertinent Negatives: Patient denies  Disposition: '[]'$ ED /'[]'$ Urgent Care (no appt availability in office) / '[x]'$ Appointment(In office/virtual)/ '[]'$  [facility]/ '[]'$ Home Care/ '[]'$ Refused Recommended Disposition /'[]'$ [facility]/ '[]'$  Follow-up with PCP Additional Notes: PT has multiple complaints.  [place] and ankles started to swell. PT called Cardiologist. Per pt she was not contacted back. Note from cardiologist, has Dr. Tamala Julian prescribing '40mg'$  of lasix for swelling. Pt was to get 10 tables to have on hand. Pt states that her ankles are red and blotchy, pt has hx. Of blood clots.  Pt has also tested + for COVID.  PT is experiencing a bad cough, chills, BA fatigue. Pt has asthma.  Reason for Disposition  [1] HIGH RISK patient (e.g., weak immune system, age > 64 years, obesity with BMI 30 or higher, pregnant, chronic lung disease or other chronic medical condition) AND [2] COVID symptoms (e.g., cough, fever)  (Exceptions: Already seen by PCP and no new or worsening symptoms.)  Answer Assessment - Initial Assessment Questions 1. COVID-19 DIAGNOSIS: "How do you know that you have COVID?" (e.g., positive lab test or self-test, diagnosed by doctor or NP/PA, symptoms after exposure).     Last night COVID + 2. COVID-19 EXPOSURE: "Was there any known exposure to COVID before the symptoms began?" CDC Definition of close contact: within 6 feet (2 meters) for a total of 15 minutes or more over a 24-hour period.      None 3. ONSET: "When did the COVID-19 symptoms start?"      Friday 4. WORST SYMPTOM: "What is your worst symptom?" (e.g., cough, fever, shortness of breath, muscle aches)     Cough 5. COUGH: "Do you have a cough?" If Yes, ask: "How bad is the cough?"       Yes -  6. FEVER: "Do you have a fever?" If Yes, ask: "What is your temperature, how was it  measured, and when did it start?"     Chills-  could not get warm 7. RESPIRATORY STATUS: "Describe your breathing?" (e.g., normal; shortness of breath, wheezing, unable to speak)      Cough 8. BETTER-SAME-WORSE: "Are you getting better, staying the same or getting worse compared to yesterday?"  If getting worse, ask, "In what way?"     Worse 9. OTHER SYMPTOMS: "Do you have any other symptoms?"  (e.g., chills, fatigue, headache, loss of smell or taste, muscle pain, sore throat)     Headache, Body aches, Fatigue, sinus drainage 10. HIGH RISK DISEASE: "Do you have any chronic medical problems?" (e.g., asthma, heart or lung disease, weak immune system, obesity, etc.)       Asthma.  11. VACCINE: "Have you had the COVID-19 vaccine?" If Yes, ask: "Which one, how many shots, when did you get it?"        12. PREGNANCY: "Is there any chance you are pregnant?" "When was your last menstrual period?"       no 13. O2 SATURATION MONITOR:  "Do you use an oxygen saturation monitor (pulse oximeter) at home?" If Yes, ask "What is your reading (oxygen level) today?" "What is your usual oxygen saturation reading?" (e.g., 95%)  Protocols used: Coronavirus (COVID-19) Diagnosed or Suspected-A-AH

## 2022-06-14 NOTE — Progress Notes (Signed)
Patient ID: Patricia Fischer, female    DOB: 1956-03-04, 66 y.o.   MRN: [id number]  PCP: Delsa Grana, PA-C  Chief Complaint  Patient presents with   Foot Swelling    Bilateral since 06/06/22. It looks a little better but still swollen compared when it started.    Cough    Started yesterday pt believes its bronchitis    Subjective:   Patricia Fischer is a 66 y.o. female, presents to clinic with CC of the following:  HPI  COVID positive yesterday with a few symptoms x 2-3 d - since the weekend She has COPD and CHF, she has cough with some mild bronchitis sx, is using her Trelegy and rescue, she has more bothersome sinus congestion, drainage, fatigue, HA   She also presents for swollen legs bilaterally which occurred the first day she was at the beach this weekend at the end of the day she looked down and saw her legs were very swollen, she also got a rash on both of her lower legs which is not itchy.  She called her cardiologist with her history of CHF and the nurse notes and MD documentation states he sent in Lasix 40 mg for her to try but the prescription was not at the pharmacy and I cannot find in the chart She has had some improvement to her lower extremity edema, L>R, with prior history of left DVT  After the first onset of her lower extremity edema she noticed some mild improvement overnight, and it has not worsened since she denies any leg pain she does note an associated weight increase 7 lbs No worse DOE or orthopnea (she sleeps elevated normally and this is not changed from her baseline) Cough/bronchitis has made breathing a little worse -not currently having severe shortness of breath or wheeze she is using the rescue inhaler and it helped a little bit, over-the-counter cough medicines and Tessalon Perles do not help her very much when she gets a COPD exacerbation and typically only Tussionex is helpful but she cannot afford it    Patient Active Problem List    Diagnosis Date Noted   Pain in joint of left knee 12/21/2021   Derangement of left knee 12/21/2021   Chronic obstructive pulmonary disease ([place]) 01/22/2020   Stress incontinence 12/27/2019   Hyperlipidemia 12/27/2019   Muscle spasms of lower extremity 12/27/2019   Class 3 severe obesity with body mass index (BMI) of 40.0 to 44.9 in adult ([place]) 07/12/2017   Allergic rhinitis 06/26/2017   Hematuria 07/27/2016   Degenerative joint disease (DJD) of hip 03/31/2016   Hx of breast cancer 03/22/2016   History of pulmonary embolism 10/27/2015   HTN (hypertension) 10/07/2015   Depression, major, recurrent, moderate ([place]) 06/11/2015   Anemia 05/15/2015   Do not resuscitate status 05/15/2015   HFrEF (heart failure with reduced ejection fraction) ([place]) 05/05/2015   Left leg DVT ([place]) 05/05/2015   Pulmonary hypertension ([place])    Saddle pulmonary embolus (HCC)    LBBB (left bundle branch block)-rate related  04/19/2015   Chronic migraine without aura, with intractable migraine, so stated, with status migrainosus 11/04/2014   Subscapularis (muscle) sprain 11/22/2013      Current Outpatient Medications:    albuterol (VENTOLIN HFA) 108 (90 Base) MCG/ACT inhaler, Inhale 2 puffs into the lungs every 6 (six) hours as needed for wheezing or shortness of breath., Disp: 18 g, Rfl: 2   carvedilol (COREG) 6.25 MG tablet, TAKE 1 TABLET BY MOUTH  TWICE A DAY, Disp: 180 tablet, Rfl: 3   cetirizine (ZYRTEC) 10 MG tablet, Take 10 mg by mouth daily., Disp: , Rfl:    fluticasone (FLONASE) 50 MCG/ACT nasal spray, Place 2 sprays into both nostrils daily., Disp: 48 mL, Rfl: 1   LORazepam (ATIVAN) 1 MG tablet, Take 1 tablet (1 mg total) by mouth once as needed for up to 1 dose for anxiety (for procedural anxiety - take one hour prior to appt)., Disp: 5 tablet, Rfl: 0   rivaroxaban (XARELTO) 10 MG TABS tablet, TAKE 1 TABLET BY MOUTH EVERY DAY, Disp: 90 tablet, Rfl: 1   rosuvastatin (CRESTOR) 20 MG tablet, Take 1 tablet  (20 mg total) by mouth daily., Disp: 90 tablet, Rfl: 3   sacubitril-valsartan (ENTRESTO) 24-26 MG, Take 1 tablet by mouth 2 (two) times daily., Disp: 180 tablet, Rfl: 3   spironolactone (ALDACTONE) 25 MG tablet, TAKE 1 TABLET (25 MG TOTAL) BY MOUTH DAILY., Disp: 90 tablet, Rfl: 3   tizanidine (ZANAFLEX) 2 MG capsule, Take 2 mg by mouth 3 (three) times daily as needed for muscle spasms., Disp: , Rfl:    traMADol (ULTRAM) 50 MG tablet, Take 50 mg by mouth every 4 (four) hours as needed., Disp: , Rfl:    TRELEGY ELLIPTA 100-62.5-25 MCG/ACT AEPB, INHALE 1 PUFF BY MOUTH EVERY DAY, Disp: 60 each, Rfl: 1   vitamin B-12 (CYANOCOBALAMIN) 500 MCG tablet, Take 2,500 mcg by mouth daily., Disp: , Rfl:    vitamin C (ASCORBIC ACID) 500 MG tablet, Take 500 mg by mouth daily., Disp: , Rfl:    Allergies  Allergen Reactions   Gadolinium Derivatives Shortness Of Breath and Nausea And Vomiting   Levaquin [Levofloxacin In D5w] Itching    Was giving with vancomycin at same time - unsure which one pt had a reaction to   Sulfa Antibiotics Swelling    "Ears swelled up like [place]"   Vancomycin Itching    Was giving at same time as Levaquin - unsure which pt had reaction to   Aripiprazole Other (See Comments)    Dry mouth with sores    Levofloxacin Itching   Codeine Itching and Rash   Oxycodone Itching   Penicillins Hives    Has patient had a PCN reaction causing immediate rash, facial/tongue/throat swelling, SOB or lightheadedness with hypotension: Yes Has patient had a PCN reaction causing severe rash involving mucus membranes or skin necrosis: No Has patient had a PCN reaction that required hospitalization No Has patient had a PCN reaction occurring within the last 10 years: No If all of the above answers are "NO", then may proceed with Cephalosporin use.     Social History   Tobacco Use   Smoking status: Never   Smokeless tobacco: Never  Vaping Use   Vaping Use: Never used  Substance Use Topics    Alcohol use: Not Currently    Comment: occasional wine   Drug use: Never      Chart Review Today: I personally reviewed active problem list, medication list, allergies, family history, social history, health maintenance, notes from last encounter, lab results, imaging with the patient/caregiver today.   Review of Systems  Constitutional: Negative.   HENT: Negative.    Eyes: Negative.   Respiratory: Negative.    Cardiovascular: Negative.   Gastrointestinal: Negative.   Endocrine: Negative.   Genitourinary: Negative.   Musculoskeletal: Negative.   Skin: Negative.   Allergic/Immunologic: Negative.   Neurological: Negative.   Hematological: Negative.   Psychiatric/Behavioral: Negative.  All other systems reviewed and are negative.      Objective:   Vitals:   06/14/22 1508  BP: 128/80  Pulse: 95  Resp: 16  Temp: 98.2 F (36.8 C)  TempSrc: Oral  SpO2: 96%  Weight: 239 lb 14.4 oz (108.8 kg)  Height: 5' 4.5" (1.638 m)    Body mass index is 40.54 kg/m.  Physical Exam Constitutional:      General: She is not in acute distress.    Appearance: Normal appearance. She is well-developed and well-groomed. She is obese. She is not ill-appearing, toxic-appearing or diaphoretic.     Comments: Appears tired, NAD  HENT:     Head: Normocephalic and atraumatic.     Right Ear: External ear normal.     Left Ear: External ear normal.     Nose: Mucosal edema, congestion and rhinorrhea present. Rhinorrhea is clear.     Right Turbinates: Enlarged.     Left Turbinates: Enlarged.     Right Sinus: Maxillary sinus tenderness present. No frontal sinus tenderness.     Left Sinus: Maxillary sinus tenderness present. No frontal sinus tenderness.     Mouth/Throat:     Pharynx: Oropharynx is clear. Uvula midline. No pharyngeal swelling, oropharyngeal exudate, posterior oropharyngeal erythema or uvula swelling.  Eyes:     General: No scleral icterus.       Right eye: No discharge.         Left eye: No discharge.     Conjunctiva/sclera: Conjunctivae normal.  Cardiovascular:     Rate and Rhythm: Normal rate and regular rhythm.     Pulses: Normal pulses.     Heart sounds: Normal heart sounds.     Comments: LLE edema >RLE - asymmetrical LE and pedal edema Pulmonary:     Effort: Pulmonary effort is normal. No tachypnea, accessory muscle usage, respiratory distress or retractions.     Breath sounds: Normal breath sounds. Transmitted upper airway sounds present. No decreased breath sounds, wheezing, rhonchi or rales.     Comments: Intermittent coughing during exam/encounter Abdominal:     General: Bowel sounds are normal.     Palpations: Abdomen is soft.  Musculoskeletal:     Right lower leg: No tenderness. Edema present.     Left lower leg: No tenderness. Edema present.  Skin:    Findings: Rash (to bilateral LE) present.  Neurological:     Mental Status: She is alert.  Psychiatric:        Mood and Affect: Mood normal.        Behavior: Behavior normal. Behavior is cooperative.      Results for orders placed or performed in visit on 03/15/22  CBC with Differential/Platelet  Result Value Ref Range   WBC 8.1 3.8 - 10.8 Thousand/uL   RBC 4.81 3.80 - 5.10 Million/uL   Hemoglobin 14.4 11.7 - 15.5 g/dL   HCT 43.4 35.0 - 45.0 %   MCV 90.2 80.0 - 100.0 fL   MCH 29.9 27.0 - 33.0 pg   MCHC 33.2 32.0 - 36.0 g/dL   RDW 13.3 11.0 - 15.0 %   Platelets 192 140 - 400 Thousand/uL   MPV 11.3 7.5 - 12.5 fL   Neutro Abs 5,605 1,500 - 7,800 cells/uL   Lymphs Abs 1,750 850 - 3,900 cells/uL   Absolute Monocytes 567 200 - 950 cells/uL   Eosinophils Absolute 138 15 - 500 cells/uL   Basophils Absolute 41 0 - 200 cells/uL   Neutrophils Relative % 69.2 %  Total Lymphocyte 21.6 %   Monocytes Relative 7.0 %   Eosinophils Relative 1.7 %   Basophils Relative 0.5 %  COMPLETE METABOLIC PANEL WITH GFR  Result Value Ref Range   Glucose, Bld 104 (H) 65 - 99 mg/dL   BUN 11 7 - 25 mg/dL    Creat 0.88 0.50 - 1.05 mg/dL   eGFR 72 > OR = 60 mL/min/1.11m   BUN/Creatinine Ratio NOT APPLICABLE 6 - 22 (calc)   Sodium 139 135 - 146 mmol/L   Potassium 4.6 3.5 - 5.3 mmol/L   Chloride 103 98 - 110 mmol/L   CO2 29 20 - 32 mmol/L   Calcium 9.5 8.6 - 10.4 mg/dL   Total Protein 6.9 6.1 - 8.1 g/dL   Albumin 4.3 3.6 - 5.1 g/dL   Globulin 2.6 1.9 - 3.7 g/dL (calc)   AG Ratio 1.7 1.0 - 2.5 (calc)   Total Bilirubin 0.7 0.2 - 1.2 mg/dL   Alkaline phosphatase (APISO) 78 37 - 153 U/L   AST 17 10 - 35 U/L   ALT 13 6 - 29 U/L  Lipid panel  Result Value Ref Range   Cholesterol 190 <200 mg/dL   HDL 48 (L) > OR = 50 mg/dL   Triglycerides 116 <150 mg/dL   LDL Cholesterol (Calc) 120 (H) mg/dL (calc)   Total CHOL/HDL Ratio 4.0 <5.0 (calc)   Non-HDL Cholesterol (Calc) 142 (H) <130 mg/dL (calc)  Cologuard  Result Value Ref Range   COLOGUARD Positive (A) Negative       Assessment & Plan:    1. Upper respiratory tract infection due to COVID-19 virus Sx onset 2-3 d ago with positive test yesterday - sinus/URI sx with generalized fatigue and malaise patient is nontoxic-appearing, very high risk discussed Paxlovid, reviewed recent renal function Other symptomatic and supportive meds over-the-counter as safe for the patient to take encouraged her to try nasal sprays, antihistamines, Coricidin or other heart safe cough or cold medicine - nirmatrelvir/ritonavir EUA (PAXLOVID) 20 x 150 MG & 10 x 100MG TABS; Take 3 tablets by mouth 2 (two) times daily for 5 days. Patient GFR is 72 Take nirmatrelvir (150 mg) two tablets twice daily for 5 days and ritonavir (100 mg) one tablet twice daily for 5 days.  Dispense: 30 tablet; Refill: 0  2. [place]heart [place] cardiologist patient was to receive 40 mg furosemide and take 1 dose to see if it improved her symptoms - furosemide (LASIX) 40 MG tablet; Take 1 tablet (40 mg total) by mouth daily.  Dispense: 10 tablet;  Refill: 0  3. Lower extremity edema Patient traveled to the beach over the weekend and walked along the beach and at the end of the first day noted worsened lower extremity edema bilaterally with left slightly worse than the right She reports 7 pounds weight gain, but no increased dyspnea on exertion or orthopnea no chest pain Focal to say if it is dependent edema from travel or CHF exacerbation she did reach out to her cardiologist and the treatment was sent in for her by me today after reviewing his directions She did have improvement of her edema with just elevating legs and sleeping overnight and there has been no worsening since that time And urged her to do the first dose as directed by Dr. STamala Julianand follow-up with him - furosemide (LASIX) 40 MG tablet; Take 1 tablet (40 mg total) by mouth daily.  Dispense: 10 tablet; Refill: 0 -  US Venous Img Lower Unilateral Left (DVT)  4. Chronic obstructive pulmonary disease with acute exacerbation (HCC) COPD with upper respiratory symptoms in the setting of COVID-positive She is having mild cough currently lungs are without wheeze rales or rhonchi she is taking a diuretic for CHF and increased lower extremity edema, she is doing Paxlovid for COVID, has managed her cough symptoms fairly well with her rescue inhaler she will continue to do this but if it worsens with chest tightness or wheeze she will start the prednisone - predniSONE (DELTASONE) 20 MG tablet; Take 2 tablets (40 mg total) by mouth daily with breakfast for 5 days.  Dispense: 10 tablet; Refill: 0  Patient does have a complicated medical history unprovoked DVT/PE, she is currently on Xarelto Her lower extremity edema occurred bilaterally but her left leg appeared worse than her right In the setting of COVID illness I do not feel the d-dimer would be helpful to r/o She has improvement in the edema, but L is still > R, there is no pain and she is still currently on tx for DVT US ordered but  if any worsening she was directed to go to the ER for eval.   5. History of DVT (deep vein thrombosis) - US Venous Img Lower Unilateral Left (DVT)  6. History of pulmonary embolus (PE) - US Venous Img Lower Unilateral Left (DVT)  7. Anticoagulant long-term use Currently on tx for DVT/PE  ER precautions reviewed and pt verbalized understanding   Delsa Grana, PA-C 06/14/22 3:30 PM

## 2022-06-16 ENCOUNTER — Telehealth: Payer: Self-pay | Admitting: Family Medicine

## 2022-06-16 MED ORDER — HYDROCOD POLI-CHLORPHE POLI ER 10-8 MG/5ML PO SUER: 2.5000 mL | Freq: Two times a day (BID) | ORAL | 0 refills | Status: DC | PRN

## 2022-06-16 NOTE — Telephone Encounter (Signed)
Pt notified- verbalized understanding.

## 2022-06-16 NOTE — Telephone Encounter (Signed)
Pt states she was seen on 10-10 for covid and bronchitis  Pt states provider informed her if she needed cough meds to call the office   Pt states otc cough meds are not helping and requesting a rx  Please assist further   [id number] [address]:  [phone] Fax:  [phone]

## 2022-08-04 ENCOUNTER — Other Ambulatory Visit: Payer: Self-pay | Admitting: Interventional Cardiology

## 2022-08-04 DIAGNOSIS — I82592 Chronic embolism and thrombosis of other specified deep vein of left lower extremity: Secondary | ICD-10-CM

## 2022-08-04 DIAGNOSIS — M25511 Pain in right shoulder: Secondary | ICD-10-CM | POA: Insufficient documentation

## 2022-08-04 NOTE — Telephone Encounter (Signed)
Xarelto '10mg'$  refill request received. Pt is 66 years old, weight-108.8kg, Crea-0.88 on 03/15/2022, last seen by Dr. Tamala Julian on 03/24/2022 (will follow up with Dr. Marlou Porch), Diagnosis-DVT & PE, CrCl-106.55 mL/min; Dose is appropriate based on dosing criteria. Will send in refill to requested pharmacy.

## 2022-09-12 ENCOUNTER — Other Ambulatory Visit: Payer: Self-pay | Admitting: Physician Assistant

## 2022-09-12 DIAGNOSIS — J441 Chronic obstructive pulmonary disease with (acute) exacerbation: Secondary | ICD-10-CM

## 2022-09-12 NOTE — Telephone Encounter (Signed)
Requested Prescriptions  Pending Prescriptions Disp Refills   albuterol (VENTOLIN HFA) 108 (90 Base) MCG/ACT inhaler [Pharmacy Med Name: ALBUTEROL HFA (PROAIR) INHALER] 8.5 each 2    Sig: TAKE 2 PUFFS BY MOUTH EVERY 6 HOURS AS NEEDED FOR WHEEZE OR SHORTNESS OF BREATH     Pulmonology:  [facility] 2 Passed - 09/12/2022  1:25 AM      Passed - Last BP in normal range    BP Readings from Last 1 Encounters:  06/14/22 128/80         Passed - Last Heart Rate in normal range    Pulse Readings from Last 1 Encounters:  06/14/22 95         Passed - Valid encounter within last 12 months    Recent Outpatient Visits           3 months ago Upper respiratory tract infection due to COVID-19 virus   [facility] Delsa Grana, PA-C   6 months ago Annual physical exam   [facility] Delsa Grana, PA-C   7 months ago Grief reaction   [facility] Delsa Grana, PA-C   9 months ago [facility] Delsa Grana, PA-C   11 months ago Burning with urination   [facility] [name], DO       Future Appointments             In 6 months Delsa Grana, PA-C [facility]

## 2022-09-14 DIAGNOSIS — M1712 Unilateral primary osteoarthritis, left knee: Secondary | ICD-10-CM | POA: Diagnosis not present

## 2022-09-20 DIAGNOSIS — L821 Other seborrheic keratosis: Secondary | ICD-10-CM | POA: Diagnosis not present

## 2022-09-20 DIAGNOSIS — D225 Melanocytic nevi of trunk: Secondary | ICD-10-CM | POA: Diagnosis not present

## 2022-09-20 DIAGNOSIS — Z859 Personal history of malignant neoplasm, unspecified: Secondary | ICD-10-CM | POA: Diagnosis not present

## 2022-09-20 DIAGNOSIS — L570 Actinic keratosis: Secondary | ICD-10-CM | POA: Diagnosis not present

## 2022-09-20 DIAGNOSIS — D485 Neoplasm of uncertain behavior of skin: Secondary | ICD-10-CM | POA: Diagnosis not present

## 2022-09-20 DIAGNOSIS — L578 Other skin changes due to chronic exposure to nonionizing radiation: Secondary | ICD-10-CM | POA: Diagnosis not present

## 2022-09-28 DIAGNOSIS — H353131 Nonexudative age-related macular degeneration, bilateral, early dry stage: Secondary | ICD-10-CM | POA: Diagnosis not present

## 2022-09-28 DIAGNOSIS — H2513 Age-related nuclear cataract, bilateral: Secondary | ICD-10-CM | POA: Diagnosis not present

## 2022-10-03 ENCOUNTER — Telehealth: Payer: Self-pay | Admitting: Family Medicine

## 2022-10-03 NOTE — Telephone Encounter (Signed)
LVM informing patient that,I have to reschedule her awv on 11/29/22 to 11/25/22.

## 2022-10-06 ENCOUNTER — Ambulatory Visit: Payer: Medicare Other

## 2022-10-06 ENCOUNTER — Ambulatory Visit (HOSPITAL_COMMUNITY): Payer: Medicare Other | Attending: Interventional Cardiology

## 2022-10-06 DIAGNOSIS — I5022 Chronic systolic (congestive) heart failure: Secondary | ICD-10-CM | POA: Insufficient documentation

## 2022-10-06 DIAGNOSIS — I10 Essential (primary) hypertension: Secondary | ICD-10-CM | POA: Diagnosis not present

## 2022-10-06 DIAGNOSIS — E7800 Pure hypercholesterolemia, unspecified: Secondary | ICD-10-CM | POA: Insufficient documentation

## 2022-10-06 DIAGNOSIS — E785 Hyperlipidemia, unspecified: Secondary | ICD-10-CM | POA: Diagnosis not present

## 2022-10-06 LAB — ECHOCARDIOGRAM COMPLETE
Area-P 1/2: 2.93 cm2
Est EF: 40
S' Lateral: 4 cm

## 2022-10-06 MED ORDER — PERFLUTREN LIPID MICROSPHERE
1.0000 mL | INTRAVENOUS | Status: AC | PRN
  Administered 2022-10-06: 2 mL via INTRAVENOUS

## 2022-10-10 LAB — COMPREHENSIVE METABOLIC PANEL
ALT: 18 IU/L (ref 0–32)
AST: 15 IU/L (ref 0–40)
Albumin/Globulin Ratio: 2 (ref 1.2–2.2)
Albumin: 4.2 g/dL (ref 3.9–4.9)
Alkaline Phosphatase: 79 IU/L (ref 44–121)
BUN/Creatinine Ratio: 17 (ref 12–28)
BUN: 13 mg/dL (ref 8–27)
Bilirubin Total: 0.6 mg/dL (ref 0.0–1.2)
CO2: 27 mmol/L (ref 20–29)
Calcium: 9.3 mg/dL (ref 8.7–10.3)
Chloride: 103 mmol/L (ref 96–106)
Creatinine, Ser: 0.78 mg/dL (ref 0.57–1.00)
Globulin, Total: 2.1 g/dL (ref 1.5–4.5)
Glucose: 111 mg/dL — ABNORMAL HIGH (ref 70–99)
Potassium: 4.2 mmol/L (ref 3.5–5.2)
Sodium: 142 mmol/L (ref 134–144)
Total Protein: 6.3 g/dL (ref 6.0–8.5)
eGFR: 83 mL/min/{1.73_m2} (ref >59)

## 2022-10-13 LAB — LIPID PANEL
Chol/HDL Ratio: 2 ratio (ref 0.0–4.4)
Cholesterol, Total: 100 mg/dL (ref 100–199)
HDL: 49 mg/dL (ref >39)
LDL Chol Calc (NIH): 37 mg/dL (ref 0–99)
Triglycerides: 59 mg/dL (ref 0–149)
VLDL Cholesterol Cal: 14 mg/dL (ref 5–40)

## 2022-10-30 ENCOUNTER — Other Ambulatory Visit: Payer: Self-pay | Admitting: Family Medicine

## 2022-10-30 DIAGNOSIS — J302 Other seasonal allergic rhinitis: Secondary | ICD-10-CM

## 2022-11-01 ENCOUNTER — Ambulatory Visit (INDEPENDENT_AMBULATORY_CARE_PROVIDER_SITE_OTHER): Payer: Medicare Other | Admitting: Family Medicine

## 2022-11-01 ENCOUNTER — Encounter: Payer: Self-pay | Admitting: Family Medicine

## 2022-11-01 ENCOUNTER — Ambulatory Visit: Payer: Self-pay | Admitting: *Deleted

## 2022-11-01 VITALS — BP 122/74 | HR 79 | Temp 97.9°F | Resp 16 | Ht 64.5 in | Wt 243.2 lb

## 2022-11-01 DIAGNOSIS — T3695XA Adverse effect of unspecified systemic antibiotic, initial encounter: Secondary | ICD-10-CM | POA: Diagnosis not present

## 2022-11-01 DIAGNOSIS — B379 Candidiasis, unspecified: Secondary | ICD-10-CM | POA: Diagnosis not present

## 2022-11-01 DIAGNOSIS — R319 Hematuria, unspecified: Secondary | ICD-10-CM

## 2022-11-01 DIAGNOSIS — Z1231 Encounter for screening mammogram for malignant neoplasm of breast: Secondary | ICD-10-CM

## 2022-11-01 DIAGNOSIS — N390 Urinary tract infection, site not specified: Secondary | ICD-10-CM

## 2022-11-01 DIAGNOSIS — Z1211 Encounter for screening for malignant neoplasm of colon: Secondary | ICD-10-CM | POA: Diagnosis not present

## 2022-11-01 DIAGNOSIS — R300 Dysuria: Secondary | ICD-10-CM | POA: Diagnosis not present

## 2022-11-01 LAB — POCT URINALYSIS DIPSTICK (MANUAL)
Nitrite, UA: POSITIVE — AB
Poct Bilirubin: NEGATIVE
Poct Blood: 250 — AB
Poct Glucose: NORMAL mg/dL
Poct Urobilinogen: NORMAL mg/dL — AB
Spec Grav, UA: 1.02 (ref 1.010–1.025)
pH, UA: 5 (ref 5.0–8.0)

## 2022-11-01 MED ORDER — PHENAZOPYRIDINE HCL 100 MG PO TABS: 100.0000 mg | ORAL_TABLET | Freq: Three times a day (TID) | ORAL | 0 refills | Status: DC | PRN

## 2022-11-01 MED ORDER — FLUCONAZOLE 150 MG PO TABS: 150.0000 mg | ORAL_TABLET | ORAL | 0 refills | Status: DC | PRN

## 2022-11-01 MED ORDER — CEPHALEXIN 500 MG PO CAPS: 500.0000 mg | ORAL_CAPSULE | Freq: Three times a day (TID) | ORAL | 0 refills | Status: AC

## 2022-11-01 NOTE — Telephone Encounter (Signed)
  Chief Complaint: UTI Symptoms: burning, frequency, dribbling, flank pain, blood in urine and lower abd pain Frequency: Since Sat.   Noticed blood this morning for first time. Pertinent Negatives: Patient denies fever Disposition: []$ ED /[]$ Urgent Care (no appt availability in office) / [x]$ Appointment(In office/virtual)/ []$  [facility]/ []$ Home Care/ []$ Refused Recommended Disposition /[]$ [facility]/ []$  Follow-up with PCP Additional Notes: Has appt. For today with Delsa Grana, PA-C for 11:40.

## 2022-11-01 NOTE — Telephone Encounter (Signed)
Reason for Disposition  [1] Pain or burning with passing urine AND [2] side (flank) or back pain present  Answer Assessment - Initial Assessment Questions 1. COLOR of URINE: "Describe the color of the urine."  (e.g., tea-colored, pink, red, bloody) "Do you have blood clots in your urine?" (e.g., none, pea, grape, small coin)     Feeling bad since Saturday.   Burning and hurting in my stomach started this morning.   I'm peeing frequently and I'm dribbling.     I've had UTIs before.   I see blood in my urine this morning.    2. ONSET: "When did the bleeding start?"      This morning the bleeding started.    I'm having pain right flank pain.   3. EPISODES: "How many times has there been blood in the urine?" or "How many times today?"     Noticed this morning.    Urine has been dark. 4. PAIN with URINATION: "Is there any pain with passing your urine?" If Yes, ask: "How bad is the pain?"  (Scale 1-10; or mild, moderate, severe)    - MILD: Complains slightly about urination hurting.    - MODERATE: Interferes with normal activities.      - SEVERE: Excruciating, unwilling or unable to urinate because of the pain.      Yes burning and lower abd pain. 5. FEVER: "Do you have a fever?" If Yes, ask: "What is your temperature, how was it measured, and when did it start?"     No 6. ASSOCIATED SYMPTOMS: "Are you passing urine more frequently than usual?"     Yes   tired    see above 7. OTHER SYMPTOMS: "Do you have any other symptoms?" (e.g., back/flank pain, abdomen pain, vomiting)     Lower abd pain 8. PREGNANCY: "Is there any chance you are pregnant?" "When was your last menstrual period?"     N/A  Protocols used: Urine - Blood In-A-AH

## 2022-11-01 NOTE — Patient Instructions (Signed)
Start the antibiotics as soon as you get them and try to take 2 doses today You can take the pyridium or get AZO over the counter to help with the pain.  If you have no improvement or if you have any worsening after about 2-3 days- you need to get rechecked If you have any confusion or vomiting don't wait to get seen right away please  We will call you in a few days with your urine culture results

## 2022-11-01 NOTE — Progress Notes (Signed)
Patient ID: Patricia Fischer, female    DOB: August 06, 1956, 67 y.o.   MRN: [id number]  PCP: Delsa Grana, PA-C  Chief Complaint  Patient presents with   Urinary Tract Infection   Back Pain    Lower x 1day   Abdominal Pain    X 1day    Subjective:   Patricia Fischer is a 67 y.o. female, presents to clinic with CC of the following:  HPI  She presents for UTI sx, back pain, frequency, urgency, fatigue, abdominal pain started and then she had gross hematuria the last couple days so she came in to get checked Dip positive Results for orders placed or performed in visit on 11/01/22  POCT Urinalysis Dip Manual  Result Value Ref Range   Spec Grav, UA 1.020 1.010 - 1.025   pH, UA 5.0 5.0 - 8.0   Leukocytes, UA Large (3+) (A) Negative   Nitrite, UA Positive (A) Negative   Poct Protein ++100 (A) Negative, trace mg/dL   Poct Glucose Normal Normal mg/dL   Poct Ketones + small (A) Negative   Poct Urobilinogen Normal (A) Normal mg/dL   Poct Bilirubin Negative Negative   Poct Blood =250 (A) Negative, trace   She denies any fever chills sweats nausea vomiting and overall endorses generally feeling tired  Last micro reviewed, march 2023 - neg, urinary sx recurrent, they were worse when she was on jardiance Not sexually active   Patient Active Problem List   Diagnosis Date Noted   Pain of right shoulder joint on movement 08/04/2022   Osteoarthritis of knee 03/17/2022   Claustrophobia 02/09/2022   Pain in joint of left knee 12/21/2021   Derangement of left knee 12/21/2021   Chronic obstructive pulmonary disease ([place]) 01/22/2020   Stress incontinence 12/27/2019   Hyperlipidemia 12/27/2019   Muscle spasms of lower extremity 12/27/2019   Class 3 severe obesity with body mass index (BMI) of 40.0 to 44.9 in adult (HCC) 07/12/2017   Allergic rhinitis 06/26/2017   Hematuria 07/27/2016   Degenerative joint disease (DJD) of hip 03/31/2016   Hx of breast cancer 03/22/2016   History of  pulmonary embolism 10/27/2015   HTN (hypertension) 10/07/2015   Depression, major, recurrent, moderate (HCC) 06/11/2015   Anemia 05/15/2015   Do not resuscitate status 05/15/2015   HFrEF (heart failure with reduced ejection fraction) (HCC) 05/05/2015   Left leg DVT ([place]) 05/05/2015   Pulmonary hypertension (HCC)    Saddle pulmonary embolus (HCC)    LBBB (left bundle branch block)-rate related  04/19/2015   Chronic migraine without aura, with intractable migraine, so stated, with status migrainosus 11/04/2014   Subscapularis (muscle) sprain 11/22/2013      Current Outpatient Medications:    albuterol (VENTOLIN HFA) 108 (90 Base) MCG/ACT inhaler, TAKE 2 PUFFS BY MOUTH EVERY 6 HOURS AS NEEDED FOR WHEEZE OR SHORTNESS OF BREATH, Disp: 8.5 each, Rfl: 2   carvedilol (COREG) 6.25 MG tablet, TAKE 1 TABLET BY MOUTH TWICE A DAY, Disp: 180 tablet, Rfl: 3   cetirizine (ZYRTEC) 10 MG tablet, Take 10 mg by mouth daily., Disp: , Rfl:    fluticasone (FLONASE) 50 MCG/ACT nasal spray, SPRAY 2 SPRAYS INTO EACH NOSTRIL EVERY DAY, Disp: 48 mL, Rfl: 1   furosemide (LASIX) 40 MG tablet, Take 1 tablet (40 mg total) by mouth daily., Disp: 10 tablet, Rfl: 0   rosuvastatin (CRESTOR) 20 MG tablet, Take 1 tablet (20 mg total) by mouth daily., Disp: 90 tablet, Rfl: 3  sacubitril-valsartan (ENTRESTO) 24-26 MG, Take 1 tablet by mouth 2 (two) times daily., Disp: 180 tablet, Rfl: 3   spironolactone (ALDACTONE) 25 MG tablet, TAKE 1 TABLET (25 MG TOTAL) BY MOUTH DAILY., Disp: 90 tablet, Rfl: 3   tizanidine (ZANAFLEX) 2 MG capsule, Take 2 mg by mouth 3 (three) times daily as needed for muscle spasms., Disp: , Rfl:    traMADol (ULTRAM) 50 MG tablet, Take 50 mg by mouth every 4 (four) hours as needed., Disp: , Rfl:    TRELEGY ELLIPTA 100-62.5-25 MCG/ACT AEPB, INHALE 1 PUFF BY MOUTH EVERY DAY, Disp: 60 each, Rfl: 1   vitamin B-12 (CYANOCOBALAMIN) 500 MCG tablet, Take 2,500 mcg by mouth daily., Disp: , Rfl:    vitamin C  (ASCORBIC ACID) 500 MG tablet, Take 500 mg by mouth daily., Disp: , Rfl:    XARELTO 10 MG TABS tablet, TAKE 1 TABLET BY MOUTH EVERY DAY, Disp: 90 tablet, Rfl: 1   Allergies  Allergen Reactions   Gadolinium Derivatives Shortness Of Breath and Nausea And Vomiting   Levaquin [Levofloxacin In D5w] Itching    Was giving with vancomycin at same time - unsure which one pt had a reaction to   Sulfa Antibiotics Swelling    "Ears swelled up like [place]"   Vancomycin Itching    Was giving at same time as Levaquin - unsure which pt had reaction to   Aripiprazole Other (See Comments)    Dry mouth with sores    Levofloxacin Itching   Codeine Itching and Rash   Oxycodone Itching   Penicillins Hives    Has patient had a PCN reaction causing immediate rash, facial/tongue/throat swelling, SOB or lightheadedness with hypotension: Yes Has patient had a PCN reaction causing severe rash involving mucus membranes or skin necrosis: No Has patient had a PCN reaction that required hospitalization No Has patient had a PCN reaction occurring within the last 10 years: No If all of the above answers are "NO", then may proceed with Cephalosporin use.     Social History   Tobacco Use   Smoking status: Never   Smokeless tobacco: Never  Vaping Use   Vaping Use: Never used  Substance Use Topics   Alcohol use: Not Currently    Comment: occasional wine   Drug use: Never      Chart Review Today: I personally reviewed active problem list, medication list, allergies, family history, social history, health maintenance, notes from last encounter, lab results, imaging with the patient/caregiver today.   Review of Systems  Constitutional: Negative.   HENT: Negative.    Eyes: Negative.   Respiratory: Negative.    Cardiovascular: Negative.   Gastrointestinal: Negative.   Endocrine: Negative.   Genitourinary: Negative.   Musculoskeletal: Negative.   Skin: Negative.   Allergic/Immunologic: Negative.    Neurological: Negative.   Hematological: Negative.   Psychiatric/Behavioral: Negative.    All other systems reviewed and are negative.      Objective:   Vitals:   11/01/22 1152  BP: 122/74  Pulse: 79  Resp: 16  Temp: 97.9 F (36.6 C)  TempSrc: Oral  SpO2: 95%  Weight: 243 lb 3.2 oz (110.3 kg)  Height: 5' 4.5" (1.638 m)    Body mass index is 41.1 kg/m.  Physical Exam Vitals and nursing note reviewed.  Constitutional:      General: She is not in acute distress.    Appearance: Normal appearance. She is well-developed and well-groomed. She is obese. She is not ill-appearing, toxic-appearing or  diaphoretic.  HENT:     Head: Normocephalic and atraumatic.     Nose: Nose normal.  Eyes:     General:        Right eye: No discharge.        Left eye: No discharge.     Conjunctiva/sclera: Conjunctivae normal.  Neck:     Trachea: No tracheal deviation.  Cardiovascular:     Rate and Rhythm: Normal rate and regular rhythm.  Pulmonary:     Effort: Pulmonary effort is normal. No respiratory distress.     Breath sounds: No stridor.  Abdominal:     General: Abdomen is protuberant. Bowel sounds are normal.     Palpations: Abdomen is soft.     Tenderness: There is abdominal tenderness in the suprapubic area. There is right CVA tenderness. There is no left CVA tenderness or rebound.  Musculoskeletal:        General: Normal range of motion.  Skin:    General: Skin is warm and dry.     Findings: No rash.  Neurological:     Mental Status: She is alert.     Motor: No abnormal muscle tone.     Coordination: Coordination normal.  Psychiatric:        Attention and Perception: Attention normal.        Mood and Affect: Mood is depressed.        Behavior: Behavior normal. Behavior is cooperative.      Results for orders placed or performed in visit on 11/01/22  POCT Urinalysis Dip Manual  Result Value Ref Range   Spec Grav, UA 1.020 1.010 - 1.025   pH, UA 5.0 5.0 - 8.0    Leukocytes, UA Large (3+) (A) Negative   Nitrite, UA Positive (A) Negative   Poct Protein ++100 (A) Negative, trace mg/dL   Poct Glucose Normal Normal mg/dL   Poct Ketones + small (A) Negative   Poct Urobilinogen Normal (A) Normal mg/dL   Poct Bilirubin Negative Negative   Poct Blood =250 (A) Negative, trace       Assessment & Plan:     ICD-10-CM   1. Urinary tract infection with hematuria, site unspecified  N39.0 POCT Urinalysis Dip Manual   R31.9 Urine Culture    cephALEXin (KEFLEX) 500 MG capsule    phenazopyridine (PYRIDIUM) 100 MG tablet   Suprapubic ttp and mild right CVA tenderness, but tolerating PO's, will try outpt PO tx coverage empiric with keflex, she has tolerated before    2. Screening for colon cancer  Z12.11 Ambulatory referral to Gastroenterology    3. Breast cancer screening by mammogram  Z12.31 MM Digital Screening    4. Antibiotic-induced yeast infection  B37.9 fluconazole (DIFLUCAN) 150 MG tablet   T36.95XA    diflucan sent in for yeast infection sx          Delsa Grana, PA-C 11/01/22 12:08 PM

## 2022-11-02 LAB — URINE CULTURE
MICRO NUMBER:: 14621503
Result:: NO GROWTH
SPECIMEN QUALITY:: ADEQUATE

## 2022-11-03 ENCOUNTER — Encounter: Payer: Self-pay | Admitting: Family Medicine

## 2022-11-03 DIAGNOSIS — R109 Unspecified abdominal pain: Secondary | ICD-10-CM

## 2022-11-03 DIAGNOSIS — R1030 Lower abdominal pain, unspecified: Secondary | ICD-10-CM

## 2022-11-03 DIAGNOSIS — Z87442 Personal history of urinary calculi: Secondary | ICD-10-CM

## 2022-11-03 DIAGNOSIS — R300 Dysuria: Secondary | ICD-10-CM

## 2022-11-03 DIAGNOSIS — R319 Hematuria, unspecified: Secondary | ICD-10-CM

## 2022-11-05 ENCOUNTER — Encounter: Payer: Self-pay | Admitting: Family Medicine

## 2022-11-07 ENCOUNTER — Encounter: Payer: Self-pay | Admitting: Cardiology

## 2022-11-07 ENCOUNTER — Ambulatory Visit: Payer: Medicare Other | Attending: Cardiology | Admitting: Cardiology

## 2022-11-07 ENCOUNTER — Encounter: Payer: Self-pay | Admitting: Family Medicine

## 2022-11-07 VITALS — BP 119/77 | HR 78 | Ht 64.0 in | Wt 240.0 lb

## 2022-11-07 DIAGNOSIS — I10 Essential (primary) hypertension: Secondary | ICD-10-CM

## 2022-11-07 DIAGNOSIS — I5022 Chronic systolic (congestive) heart failure: Secondary | ICD-10-CM | POA: Diagnosis not present

## 2022-11-07 MED ORDER — TAMSULOSIN HCL 0.4 MG PO CAPS: 0.4000 mg | ORAL_CAPSULE | Freq: Every day | ORAL | 3 refills | Status: DC

## 2022-11-07 NOTE — Progress Notes (Signed)
Cardiology Office Note:    Date:  11/07/2022   ID:  Patricia Fischer, DOB 07/14/1956, MRN [id number]  PCP:  Patricia Fischer, [facility] Cardiologist:  Candee Furbish, MD     Referring MD: Patricia Grana, PA-C    History of Present Illness:    Patricia Fischer is a 67 y.o. female former patient of Dr. Daneen Schick with history of pulmonary embolism, chronic systolic heart failure with EF 52% by MRI in 2018, 25 to 30% in February 2023, hypertension, nonobstructive coronary artery disease by catheterization in 2017, nonischemic cardiomyopathy, chronic anticoagulation here for follow-up.  Had urinary tract infection on Farxiga.  Now on 3 drug therapy including spironolactone Entresto and beta-blocker.  Kidney stones.   Still feeling some shortness of breath with activity.  She is also still grieving the loss of her husband approximately 2 years ago, 2022.  She lives with her son on his property and a new camper.  She does go to Bible study.   Past Medical History:  Diagnosis Date   Allergy    Anxiety    Asthma    Blood transfusion 1979   Chidbirth   Breast cancer, left breast ([place]) 2011   DCIS   Chronic systolic CHF w/ Improved LVF 05/05/2015   NICM // Dx 04/2015 - EF initially 25-30% with RV dysfunction // f/u echo 08/2015 technically difficult, EF 35-40%, anterior, anteroseptal, apical and inferoapical severe hypokinesis suggestive of LAD territory ischemia/infarct, grade 1 DD, mild MR, RV normal. // Echo 5/18: mild LVH, EF 30-35, Gr 1 DD // cMRI 12/18: EF 52    COPD (chronic obstructive pulmonary disease) (HCC)    Depression    DVT (deep venous thrombosis) ([place]) 04/2015   a. Dx 04/2015 - patient returned 05/2015 after noncompliance with Xarelto.   Environmental allergies    Essential hypertension    Family Hx of adverse reaction to anesthesia    " My Mother would get deathly sick "   Gastroesophageal reflux disease    Head injury 2011   Hemorrhoids     History of cardiac catheterization    Cath 10/2015: oLCx 50 (prob cath induced spasm), no sig CAD, EF 35-45   LBBB (left bundle branch block)    Migraine HAs 11/2014   Obesity    Orthostatic hypotension    Pneumonia 2012   Saddle pulmonary embolus ([place]) 04/2015   a. Dx 04/2015 - patient returned 05/2015 after noncompliance with Xarelto.   Shortness of breath dyspnea    Syncope    a. Recurrent syncope in 2016 felt 2/2 orthostasis in setting of PE.    Past Surgical History:  Procedure Laterality Date   ABDOMINAL HYSTERECTOMY  1986   ANAL FISSURE REPAIR     ANTERIOR CERVICAL DECOMP/DISCECTOMY FUSION  01/23/2012   Procedure: ANTERIOR CERVICAL DECOMPRESSION/DISCECTOMY FUSION 1 LEVEL;  Surgeon: Elaina Hoops, MD;  Location: [facility];  Service: Neurosurgery;  Laterality: N/A;  Cervical five-six anterior cervical discectomy fusion with plating   APPENDECTOMY     BREAST BIOPSY Left 06/2016   US biopsy-PAPILLARY CYSTIC APOCRINE CHANGE    BREAST SURGERY Left 2011   Breast lumpectomy   CARDIAC CATHETERIZATION  2010   [facility]; pt states it was "clear"   CARDIAC CATHETERIZATION N/A 10/28/2015   Procedure: Right/Left Heart Cath and Coronary Angiography;  Surgeon: Belva Crome, MD;  Location: [facility];  Service: Cardiovascular;  Laterality: N/A;   CARPAL TUNNEL RELEASE  Bilateral    [facility]     COLONOSCOPY  01/16/2009   Dr Bary Castilla   KNEE CARTILAGE SURGERY Left    SHOULDER ARTHROSCOPY WITH SUBACROMIAL DECOMPRESSION, ROTATOR CUFF REPAIR AND BICEP TENDON REPAIR Right 11/22/2013   Procedure: RIGHT SHOULDER ATHROSCOPY OPEN SUBSCAP REPAIR DELTA-PECTORAL ;  Surgeon: Augustin Schooling, MD;  Location: [facility];  Service: Orthopedics;  Laterality: Right;   TARSAL TUNNEL RELEASE Left 1990   TONSILLECTOMY     TUBAL LIGATION      Current Medications: Current Meds  Medication Sig   albuterol (VENTOLIN HFA) 108 (90 Base) MCG/ACT inhaler TAKE 2 PUFFS  BY MOUTH EVERY 6 HOURS AS NEEDED FOR WHEEZE OR SHORTNESS OF BREATH   carvedilol (COREG) 6.25 MG tablet TAKE 1 TABLET BY MOUTH TWICE A DAY   cephALEXin (KEFLEX) 500 MG capsule Take 1 capsule (500 mg total) by mouth 3 (three) times daily for 7 days.   cetirizine (ZYRTEC) 10 MG tablet Take 10 mg by mouth daily.   fluconazole (DIFLUCAN) 150 MG tablet Take 1 tablet (150 mg total) by mouth every 3 (three) days as needed (for vaginal itching/yeast infection sx).   fluticasone (FLONASE) 50 MCG/ACT nasal spray SPRAY 2 SPRAYS INTO EACH NOSTRIL EVERY DAY   furosemide (LASIX) 40 MG tablet Take 1 tablet (40 mg total) by mouth daily.   phenazopyridine (PYRIDIUM) 100 MG tablet Take 1-2 tablets (100-200 mg total) by mouth 3 (three) times daily as needed for pain.   rosuvastatin (CRESTOR) 20 MG tablet Take 1 tablet (20 mg total) by mouth daily.   sacubitril-valsartan (ENTRESTO) 24-26 MG Take 1 tablet by mouth 2 (two) times daily.   spironolactone (ALDACTONE) 25 MG tablet TAKE 1 TABLET (25 MG TOTAL) BY MOUTH DAILY.   tizanidine (ZANAFLEX) 2 MG capsule Take 2 mg by mouth 3 (three) times daily as needed for muscle spasms.   traMADol (ULTRAM) 50 MG tablet Take 50 mg by mouth every 4 (four) hours as needed.   TRELEGY ELLIPTA 100-62.5-25 MCG/ACT AEPB INHALE 1 PUFF BY MOUTH EVERY DAY   vitamin B-12 (CYANOCOBALAMIN) 500 MCG tablet Take 2,500 mcg by mouth daily.   vitamin C (ASCORBIC ACID) 500 MG tablet Take 500 mg by mouth daily.   XARELTO 10 MG TABS tablet TAKE 1 TABLET BY MOUTH EVERY DAY     Allergies:   Gadolinium derivatives, Levaquin [levofloxacin in d5w], Sulfa antibiotics, Vancomycin, Aripiprazole, Levofloxacin, Codeine, Oxycodone, and Penicillins   Social History   Socioeconomic History   Marital status: Widowed    Spouse name: Not on file   Number of children: 2   Years of education: 74   Highest education level: Some college, no degree  Occupational History   Occupation: retired  Tobacco Use    Smoking status: Never   Smokeless tobacco: Never  Vaping Use   Vaping Use: Never used  Substance and Sexual Activity   Alcohol use: Not Currently    Comment: occasional wine   Drug use: Never   Sexual activity: Not Currently    Birth control/protection: None  Other Topics Concern   Not on file  Social History Narrative   Not on file   Social Determinants of Health   Financial Resource Strain: Low Risk  (03/15/2022)   Overall Financial Resource Strain (CARDIA)    Difficulty of Paying Living Expenses: Not hard at all  Food Insecurity: No Food Insecurity (03/15/2022)   Hunger Vital Sign    Worried About Running  Out of Food in the Last Year: Never true    Ran Out of Food in the Last Year: Never true  Transportation Needs: No Transportation Needs (03/15/2022)   PRAPARE - Hydrologist (Medical): No    Lack of Transportation (Non-Medical): No  Physical Activity: Sufficiently Active (03/15/2022)   Exercise Vital Sign    Days of Exercise per Week: 3 days    Minutes of Exercise per Session: 50 min  Stress: Stress Concern Present (03/15/2022)   [place]    Feeling of Stress : To some extent  Social Connections: Moderately Integrated (03/15/2022)   Social Connection and Isolation Panel [NHANES]    Frequency of Communication with Friends and Family: Three times a week    Frequency of Social Gatherings with Friends and Family: Three times a week    Attends Religious Services: More than 4 times per year    Active Member of Clubs or Organizations: Yes    Attends Archivist Meetings: More than 4 times per year    Marital Status: Widowed     Family History: The patient's family history includes Alcohol abuse in her father; Aneurysm in her paternal grandfather; Breast cancer in her cousin, cousin, cousin, and paternal aunt; Breast cancer (age of onset: 55) in her mother; COPD in her father  and paternal grandfather; Cancer in her father and mother; Diabetes in her mother and sister; Heart attack in her paternal grandmother; Heart disease in her paternal grandmother; Hypertension in her mother; Migraines in her mother; Rectal cancer in her father; Stroke in her paternal grandmother. There is no history of Anesthesia problems.  ROS:   Please see the history of present illness.     All other systems reviewed and are negative.  EKGs/Labs/Other Studies Reviewed:    The following studies were reviewed today: Cardiac Studies & Procedures   CARDIAC CATHETERIZATION  CARDIAC CATHETERIZATION 10/28/2015  Narrative 1. There is moderate to severe left ventricular systolic dysfunction. 2. Ost Cx lesion, 50% stenosed.   Widely patent coronary arteries. There is ostial 50% narrowing of the circumflex that I feel represents catheter-induced spasm. No high-grade obstruction is noted in any major or branch coronary vessel.  Left ventricular systolic dysfunction with estimated ejection fraction in the 35% range. Elevated pulmonary capillary wedge pressure of 22 mmHg an EDP of 18 mmHg.  Overall findings suggest nonischemic cardiomyopathy  RECOMMENDATIONS:   Resume Xarelto  Start Entresto 24/26 mg BID dose  Optimize beta blocker therapy  Findings Coronary Findings Diagnostic  Dominance: Right  Left Anterior Descending  First Septal Branch The vessel is small in size.  Left Circumflex  Intervention  No interventions have been documented.     ECHOCARDIOGRAM  ECHOCARDIOGRAM COMPLETE 10/06/2022  Narrative ECHOCARDIOGRAM REPORT    Patient Name:   ROMOLA WORKS Date of Exam: 10/06/2022 Medical Rec #:  [id number]          Height:       64.5 in Accession #:    [id number]         Weight:       239.9 lb Date of Birth:  May 16, 1956          BSA:          2.125 m Patient Age:    10 years           BP:           128/80 mmHg Patient  Gender: F                  HR:           70  bpm. Exam Location:  [facility]  Procedure: 2D Echo, Cardiac Doppler, Color Doppler and Intracardiac Opacification Agent  Indications:    I50.22 CHF  History:        Patient has prior history of Echocardiogram examinations, most recent 10/18/2021. CHF, COPD, Arrythmias:LBBB, Signs/Symptoms:Syncope and Shortness of Breath; Risk Factors:Family History of Coronary Artery Disease and Hypertension. Left Breast Cancer status post Lumpectomy (2011), DVT with Saddle Pulmonary Embolism (2016), Obesity.  Sonographer:    Deliah Boston RDCS Referring Phys: [place]   1. Left ventricular ejection fraction, by estimation, is 40%. The left ventricle has mildly decreased function. There is septal-lateral dyssynchrony in the setting of LBBB, however wall motion difficult to assess due to incomplete visualization of LV endocardium. The left ventricular cavity size was mildly dilated. Left ventricular diastolic parameters are consistent with Grade I diastolic dysfunction (impaired relaxation). 2. Right ventricular systolic function is normal. The right ventricular size is normal. Tricuspid regurgitation signal is inadequate for assessing PA pressure. 3. The mitral valve is grossly normal. Trivial mitral valve regurgitation. 4. The aortic valve is tricuspid. Aortic valve regurgitation is not visualized. 5. The inferior vena cava is normal in size with greater than 50% respiratory variability, suggesting right atrial pressure of 3 mmHg.  Comparison(s): Compared to prior TTE, the LVEF has improved from 20-25% to 40%.  FINDINGS Left Ventricle: Left ventricular ejection fraction, by estimation, is 40%. The left ventricle has mildly decreased function. Definity contrast agent was given IV to delineate the left ventricular endocardial borders. The left ventricular internal cavity size was mildly dilated. There is no left ventricular hypertrophy. Abnormal (paradoxical) septal motion,  consistent with left bundle branch block. Left ventricular diastolic parameters are consistent with Grade I diastolic dysfunction (impaired relaxation).  Right Ventricle: The right ventricular size is normal. No increase in right ventricular wall thickness. Right ventricular systolic function is normal. Tricuspid regurgitation signal is inadequate for assessing PA pressure.  Left Atrium: Left atrial size was normal in size.  Right Atrium: Right atrial size was normal in size.  Pericardium: There is no evidence of pericardial effusion.  Mitral Valve: The mitral valve is grossly normal. Trivial mitral valve regurgitation.  Tricuspid Valve: The tricuspid valve is normal in structure. Tricuspid valve regurgitation is not demonstrated.  Aortic Valve: The aortic valve is tricuspid. Aortic valve regurgitation is not visualized.  Pulmonic Valve: The pulmonic valve was normal in structure. Pulmonic valve regurgitation is trivial.  Aorta: The aortic root and ascending aorta are structurally normal, with no evidence of dilitation.  Venous: The inferior vena cava is normal in size with greater than 50% respiratory variability, suggesting right atrial pressure of 3 mmHg.  IAS/Shunts: The atrial septum is grossly normal.   LEFT VENTRICLE PLAX 2D LVIDd:         5.35 cm   Diastology LVIDs:         4.00 cm   LV e' medial:    4.57 cm/s LV PW:         1.05 cm   LV E/e' medial:  8.7 LV IVS:        0.95 cm   LV e' lateral:   4.46 cm/s LVOT diam:     2.70 cm   LV E/e' lateral: 8.9 LV SV:  58 LV SV Index:   27 LVOT Area:     5.73 cm   RIGHT VENTRICLE RV Basal diam:  3.30 cm RV S prime:     14.60 cm/s TAPSE (M-mode): 2.0 cm  LEFT ATRIUM             Index        RIGHT ATRIUM           Index LA diam:        5.10 cm 2.40 cm/m   RA Area:     11.10 cm LA Vol (A2C):   14.9 ml 7.01 ml/m   RA Volume:   28.50 ml  13.41 ml/m LA Vol (A4C):   67.7 ml 31.86 ml/m LA Biplane Vol: 35.1 ml 16.52  ml/m AORTIC VALVE LVOT Vmax:   51.05 cm/s LVOT Vmean:  33.850 cm/s LVOT VTI:    0.100 m  AORTA Ao Root diam: 3.00 cm Ao Asc diam:  3.50 cm  MITRAL VALVE MV Area (PHT): cm         SHUNTS MV Decel Time: 259 msec    Systemic VTI:  0.10 m MV E velocity: 39.85 cm/s  Systemic Diam: 2.70 cm MV A velocity: 78.35 cm/s MV E/A ratio:  0.51  Gwyndolyn Kaufman MD Electronically signed by Gwyndolyn Kaufman MD Signature Date/Time: 10/06/2022/1:42:01 PM    Final      CARDIAC MRI  MR CARDIAC MORPHOLOGY W WO CONTRAST 08/21/2017  Narrative CLINICAL DATA:  Cardiomyopathy of uncertain etiology  EXAM: CARDIAC MRI  TECHNIQUE: The patient was scanned on a 1.5 Tesla GE magnet. A dedicated cardiac coil was used. Functional imaging was done using Fiesta sequences. 2,3, and 4 chamber views were done to assess for RWMA's. Modified Simpson's rule using a short axis stack was used to calculate an ejection fraction on a dedicated work Conservation officer, nature. The patient received 36 cc of Multihance. After 10 minutes inversion recovery sequences were used to assess for infiltration and scar tissue.  CONTRAST:  36 cc Multihance contrast  FINDINGS: Limited images of the lung fields showed no gross abnormalities.  Normal left ventricular size and wall thickness. EF 52% with mild diffuse hypokinesis. Normal right ventricular size and systolic function. Normal left atrial size. Normal right atrial size. Lipomatous atrial septal hypertrophy. Trileaflet aortic valve with no stenosis or significant regurgitation. There appeared to be mild mitral regurgitation.  On delayed enhancement imaging, there was no myocardial late gadolinium enhancement (LGE).  MEASUREMENTS: MEASUREMENTS LVEDV 141 mL  LVSV 74 mL  LVEF 52%  IMPRESSION: 1.  Normal LV size with mild diffuse hypokinesis, EF 52%.  2.  Normal RV size and systolic function.  3. No myocardial LGE, so no definitive evidence for  prior MI, infiltrative disease, or myocarditis.  Dalton Mclean   Electronically Signed By: Loralie Champagne M.D. On: 08/21/2017 13:54          EKG: 11/07/2022: Sinus rhythm 78 first-degree AV block left bundle branch block QRS duration 137m.  Recent Labs: 03/15/2022: Hemoglobin 14.4; Platelets 192 10/06/2022: ALT 18; BUN 13; Creatinine, Ser 0.78; Potassium 4.2; Sodium 142  Recent Lipid Panel    Component Value Date/Time   CHOL 100 10/06/2022 0000   TRIG 59 10/06/2022 0000   HDL 49 10/06/2022 0000   CHOLHDL 2.0 10/06/2022 0000   CHOLHDL 4.0 03/15/2022 1112   VLDL 18 05/17/2016 1454   LDLCALC 37 10/06/2022 0000   LDLCALC 120 (H) 03/15/2022 1112     Risk Assessment/Calculations:  Physical Exam:    VS:  BP 119/77   Pulse 78   Ht '5\' 4"'$  (1.626 m)   Wt 240 lb (108.9 kg)   SpO2 95%   BMI 41.20 kg/m     Wt Readings from Last 3 Encounters:  11/07/22 240 lb (108.9 kg)  11/01/22 243 lb 3.2 oz (110.3 kg)  06/14/22 239 lb 14.4 oz (108.8 kg)     GEN:  Well nourished, well developed in no acute distress HEENT: Normal NECK: No JVD; No carotid bruits LYMPHATICS: No lymphadenopathy CARDIAC: RRR, no murmurs, rubs, gallops RESPIRATORY:  Clear to auscultation without rales, wheezing or rhonchi  ABDOMEN: Soft, non-tender, non-distended MUSCULOSKELETAL:  No edema; No deformity  SKIN: Warm and dry NEUROLOGIC:  Alert and oriented x 3 PSYCHIATRIC:  Normal affect   ASSESSMENT:    1. Chronic systolic heart failure ([place])   2. Essential hypertension    PLAN:    In order of problems listed above:  Prior history of pulmonary embolism - On lifelong Xarelto therapy.  Chronic systolic heart failure secondary to nonischemic cardiomyopathy - Prior EF 25% on Entresto spironolactone and carvedilol.  Most recent echocardiogram January 2024 showed EF of 40%.  Does not need an ICD based upon her ejection fraction [place] caused UTI. -Has seen Dr. Haroldine Laws in the past.   No changes made to medications.  A1c 5.6 creatinine 0.7 LDL 37   Morbid obesity - Continuing to encourage weight loss.  She is no longer drinking sweet tea.  She does drink flavored water.  Continue to encourage exercise.    Knee osteoarthritis - She may end up having knee surgery soon.  I do think that she can proceed with this with moderate overall cardiac risk given her ejection fraction of 40%.  She is well compensated from a heart failure perspective.  NYHA class II.  Coronary artery disease - Nonobstructive CAD in 2017 catheterization.  On rosuvastatin 20 mg a day.  LDL 37.          Medication Adjustments/Labs and Tests Ordered: Current medicines are reviewed at length with the patient today.  Concerns regarding medicines are outlined above.  Orders Placed This Encounter  Procedures   EKG 12-Lead   No orders of the defined types were placed in this encounter.   Patient Instructions  Medication Instructions:  The current medical regimen is effective;  continue present plan and medications.  *If you need a refill on your cardiac medications before your next appointment, please call your pharmacy*  Follow-Up: At [facility], you and your health needs are our priority.  As part of our continuing mission to provide you with exceptional heart care, we have created designated Provider Care Teams.  These Care Teams include your primary Cardiologist (physician) and Advanced Practice Providers (APPs -  Physician Assistants and Nurse Practitioners) who all work together to provide you with the care you need, when you need it.  We recommend signing up for the patient portal called "MyChart".  Sign up information is provided on this After Visit Summary.  MyChart is used to connect with patients for Virtual Visits (Telemedicine).  Patients are able to view lab/test results, encounter notes, upcoming appointments, etc.  Non-urgent messages can be sent to your provider as well.    To learn more about what you can do with MyChart, go to [email or web address].    Your next appointment:   6 month(s)  Provider:   Richardson Dopp, PA-C      Then,Dr  Candee Furbish will plan to see you again in 1 year(s).       Signed, Candee Furbish, MD  11/07/2022 12:20 PM    [place]

## 2022-11-07 NOTE — Patient Instructions (Signed)
Medication Instructions:  The current medical regimen is effective;  continue present plan and medications.  *If you need a refill on your cardiac medications before your next appointment, please call your pharmacy*  Follow-Up: At [facility], you and your health needs are our priority.  As part of our continuing mission to provide you with exceptional heart care, we have created designated Provider Care Teams.  These Care Teams include your primary Cardiologist (physician) and Advanced Practice Providers (APPs -  Physician Assistants and Nurse Practitioners) who all work together to provide you with the care you need, when you need it.  We recommend signing up for the patient portal called "MyChart".  Sign up information is provided on this After Visit Summary.  MyChart is used to connect with patients for Virtual Visits (Telemedicine).  Patients are able to view lab/test results, encounter notes, upcoming appointments, etc.  Non-urgent messages can be sent to your provider as well.   To learn more about what you can do with MyChart, go to [email or web address].    Your next appointment:   6 month(s)  Provider:   Richardson Dopp, PA-C      Then,Dr Candee Furbish will plan to see you again in 1 year(s).

## 2022-11-10 ENCOUNTER — Ambulatory Visit
Admission: RE | Admit: 2022-11-10 | Discharge: 2022-11-10 | Disposition: A | Discharge status: Home / Self Care | Payer: Medicare Other | Source: Ambulatory Visit | Attending: Family Medicine | Admitting: Family Medicine

## 2022-11-10 DIAGNOSIS — R1030 Lower abdominal pain, unspecified: Secondary | ICD-10-CM | POA: Insufficient documentation

## 2022-11-10 DIAGNOSIS — R109 Unspecified abdominal pain: Secondary | ICD-10-CM | POA: Insufficient documentation

## 2022-11-10 DIAGNOSIS — I700 Atherosclerosis of aorta: Secondary | ICD-10-CM | POA: Diagnosis not present

## 2022-11-10 DIAGNOSIS — Z87442 Personal history of urinary calculi: Secondary | ICD-10-CM | POA: Insufficient documentation

## 2022-11-10 DIAGNOSIS — R300 Dysuria: Secondary | ICD-10-CM | POA: Diagnosis not present

## 2022-11-10 DIAGNOSIS — R319 Hematuria, unspecified: Secondary | ICD-10-CM | POA: Diagnosis not present

## 2022-11-15 ENCOUNTER — Ambulatory Visit (INDEPENDENT_AMBULATORY_CARE_PROVIDER_SITE_OTHER): Payer: Medicare Other | Admitting: Family Medicine

## 2022-11-15 ENCOUNTER — Encounter: Payer: Self-pay | Admitting: Family Medicine

## 2022-11-15 VITALS — BP 108/72 | HR 91 | Temp 98.8°F | Resp 16 | Ht 64.0 in | Wt 239.5 lb

## 2022-11-15 DIAGNOSIS — R9341 Abnormal radiologic findings on diagnostic imaging of renal pelvis, ureter, or bladder: Secondary | ICD-10-CM | POA: Diagnosis not present

## 2022-11-15 DIAGNOSIS — J0100 Acute maxillary sinusitis, unspecified: Secondary | ICD-10-CM

## 2022-11-15 DIAGNOSIS — R109 Unspecified abdominal pain: Secondary | ICD-10-CM

## 2022-11-15 DIAGNOSIS — R1030 Lower abdominal pain, unspecified: Secondary | ICD-10-CM | POA: Diagnosis not present

## 2022-11-15 DIAGNOSIS — Z87442 Personal history of urinary calculi: Secondary | ICD-10-CM

## 2022-11-15 DIAGNOSIS — M545 Low back pain, unspecified: Secondary | ICD-10-CM

## 2022-11-15 DIAGNOSIS — R319 Hematuria, unspecified: Secondary | ICD-10-CM | POA: Diagnosis not present

## 2022-11-15 DIAGNOSIS — R300 Dysuria: Secondary | ICD-10-CM | POA: Diagnosis not present

## 2022-11-15 MED ORDER — DOXYCYCLINE HYCLATE 100 MG PO TABS: 100.0000 mg | ORAL_TABLET | Freq: Two times a day (BID) | ORAL | 0 refills | Status: AC

## 2022-11-15 NOTE — Progress Notes (Signed)
Patient ID: [name], female    DOB: Nov 05, 1955, 67 y.o.   MRN: [id number]  PCP: Delsa Grana, PA-C  Chief Complaint  Patient presents with   Urinary Frequency    Still having frequency   Results    Discuss urine/CT results    Subjective:   CAPRI HUPPERT is a 67 y.o. female, presents to clinic with CC of the following:  HPI  Pt here for recheck Recent OV with dysuria, urinary frequency, abd pain and back pain with neg urine culture No improvement with abx prior to urine culture And CT abd/pelvis which did not show kidney stone  She continues to have urinary Freq, dysuria is a little better,she describes low pelvic pressure that feels like when she was in labor having back labor. She also has low back pain with some radiation to right buttock  Nasal/sinus congestion/allergies with facial/sinus pain, teeth pain, feeling really exhausted and run down, she feels like she's getting sinus infection despite being on her allergy meds, she does have worse sx in spring She is compliant with trelegy inhaler- some more coughing and scratchy sore throat, w/o fever   Patient Active Problem List   Diagnosis Date Noted   Pain of right shoulder joint on movement 08/04/2022   Osteoarthritis of knee 03/17/2022   Claustrophobia 02/09/2022   Pain in joint of left knee 12/21/2021   Derangement of left knee 12/21/2021   Chronic obstructive pulmonary disease ([place]) 01/22/2020   Stress incontinence 12/27/2019   Hyperlipidemia 12/27/2019   Muscle spasms of lower extremity 12/27/2019   Class 3 severe obesity with body mass index (BMI) of 40.0 to 44.9 in adult ([place]) 07/12/2017   Allergic rhinitis 06/26/2017   Hematuria 07/27/2016   Degenerative joint disease (DJD) of hip 03/31/2016   Hx of breast cancer 03/22/2016   History of pulmonary embolism 10/27/2015   HTN (hypertension) 10/07/2015   Depression, major, recurrent, moderate ([place]) 06/11/2015   Anemia 05/15/2015   Do not  resuscitate status 05/15/2015   HFrEF (heart failure with reduced ejection fraction) ([place]) 05/05/2015   Left leg DVT ([place]) 05/05/2015   Pulmonary hypertension ([facility])    Saddle pulmonary embolus ([facility])    LBBB (left bundle branch block)-rate related  04/19/2015   Chronic migraine without aura, with intractable migraine, so stated, with status migrainosus 11/04/2014   Subscapularis (muscle) sprain 11/22/2013      Current Outpatient Medications:    albuterol (VENTOLIN HFA) 108 (90 Base) MCG/ACT inhaler, TAKE 2 PUFFS BY MOUTH EVERY 6 HOURS AS NEEDED FOR WHEEZE OR SHORTNESS OF BREATH, Disp: 8.5 each, Rfl: 2   carvedilol (COREG) 6.25 MG tablet, TAKE 1 TABLET BY MOUTH TWICE A DAY, Disp: 180 tablet, Rfl: 3   cetirizine (ZYRTEC) 10 MG tablet, Take 10 mg by mouth daily., Disp: , Rfl:    fluconazole (DIFLUCAN) 150 MG tablet, Take 1 tablet (150 mg total) by mouth every 3 (three) days as needed (for vaginal itching/yeast infection sx)., Disp: 2 tablet, Rfl: 0   fluticasone (FLONASE) 50 MCG/ACT nasal spray, SPRAY 2 SPRAYS INTO EACH NOSTRIL EVERY DAY, Disp: 48 mL, Rfl: 1   furosemide (LASIX) 40 MG tablet, Take 1 tablet (40 mg total) by mouth daily., Disp: 10 tablet, Rfl: 0   phenazopyridine (PYRIDIUM) 100 MG tablet, Take 1-2 tablets (100-200 mg total) by mouth 3 (three) times daily as needed for pain., Disp: 10 tablet, Rfl: 0   rosuvastatin (CRESTOR) 20 MG tablet, Take 1 tablet (20 mg total)  by mouth daily., Disp: 90 tablet, Rfl: 3   sacubitril-valsartan (ENTRESTO) 24-26 MG, Take 1 tablet by mouth 2 (two) times daily., Disp: 180 tablet, Rfl: 3   spironolactone (ALDACTONE) 25 MG tablet, TAKE 1 TABLET (25 MG TOTAL) BY MOUTH DAILY., Disp: 90 tablet, Rfl: 3   tamsulosin (FLOMAX) 0.4 MG CAPS capsule, Take 1 capsule (0.4 mg total) by mouth daily., Disp: 30 capsule, Rfl: 3   tizanidine (ZANAFLEX) 2 MG capsule, Take 2 mg by mouth 3 (three) times daily as needed for muscle spasms., Disp: , Rfl:    traMADol (ULTRAM)  50 MG tablet, Take 50 mg by mouth every 4 (four) hours as needed., Disp: , Rfl:    TRELEGY ELLIPTA 100-62.5-25 MCG/ACT AEPB, INHALE 1 PUFF BY MOUTH EVERY DAY, Disp: 60 each, Rfl: 1   vitamin B-12 (CYANOCOBALAMIN) 500 MCG tablet, Take 2,500 mcg by mouth daily., Disp: , Rfl:    vitamin C (ASCORBIC ACID) 500 MG tablet, Take 500 mg by mouth daily., Disp: , Rfl:    XARELTO 10 MG TABS tablet, TAKE 1 TABLET BY MOUTH EVERY DAY, Disp: 90 tablet, Rfl: 1   Allergies  Allergen Reactions   Gadolinium Derivatives Shortness Of Breath and Nausea And Vomiting   Levaquin [Levofloxacin In D5w] Itching    Was giving with vancomycin at same time - unsure which one pt had a reaction to   Sulfa Antibiotics Swelling    "Ears swelled up like [place]"   Vancomycin Itching    Was giving at same time as Levaquin - unsure which pt had reaction to   Aripiprazole Other (See Comments)    Dry mouth with sores    Levofloxacin Itching   Codeine Itching and Rash   Oxycodone Itching   Penicillins Hives    Has patient had a PCN reaction causing immediate rash, facial/tongue/throat swelling, SOB or lightheadedness with hypotension: Yes Has patient had a PCN reaction causing severe rash involving mucus membranes or skin necrosis: No Has patient had a PCN reaction that required hospitalization No Has patient had a PCN reaction occurring within the last 10 years: No If all of the above answers are "NO", then may proceed with Cephalosporin use.     Social History   Tobacco Use   Smoking status: Never   Smokeless tobacco: Never  Vaping Use   Vaping Use: Never used  Substance Use Topics   Alcohol use: Not Currently    Comment: occasional wine   Drug use: Never      Chart Review Today: I personally reviewed active problem list, medication list, allergies, family history, social history, health maintenance, notes from last encounter, lab results, imaging with the patient/caregiver today.   Review of Systems   Constitutional: Negative.   HENT: Negative.    Eyes: Negative.   Respiratory: Negative.    Cardiovascular: Negative.   Gastrointestinal: Negative.   Endocrine: Negative.   Genitourinary: Negative.   Musculoskeletal: Negative.   Skin: Negative.   Allergic/Immunologic: Negative.   Neurological: Negative.   Hematological: Negative.   Psychiatric/Behavioral: Negative.    All other systems reviewed and are negative.      Objective:   Vitals:   11/15/22 1419  BP: 108/72  Pulse: 91  Resp: 16  Temp: 98.8 F (37.1 C)  TempSrc: Oral  SpO2: 94%  Weight: 239 lb 8 oz (108.6 kg)  Height: '5\' 4"'$  (1.626 m)    Body mass index is 41.11 kg/m.  Physical Exam Vitals and nursing note reviewed.  Constitutional:  General: She is not in acute distress.    Appearance: She is well-developed. She is obese. She is not ill-appearing, toxic-appearing or diaphoretic.  HENT:     Head: Normocephalic and atraumatic.     Right Ear: Tympanic membrane, ear canal and external ear normal. There is no impacted cerumen.     Left Ear: Tympanic membrane, ear canal and external ear normal. There is no impacted cerumen.     Nose: Congestion and rhinorrhea present.     Mouth/Throat:     Pharynx: Oropharynx is clear. Posterior oropharyngeal erythema present. No oropharyngeal exudate.  Eyes:     General: No scleral icterus.       Right eye: No discharge.        Left eye: No discharge.     Conjunctiva/sclera: Conjunctivae normal.  Neck:     Trachea: No tracheal deviation.  Cardiovascular:     Rate and Rhythm: Normal rate and regular rhythm.     Pulses: Normal pulses.     Heart sounds: Normal heart sounds.  Pulmonary:     Effort: Pulmonary effort is normal. No respiratory distress.     Breath sounds: Normal breath sounds. No stridor. No wheezing, rhonchi or rales.  Abdominal:     General: Bowel sounds are normal.     Palpations: Abdomen is soft.     Tenderness: There is no abdominal tenderness.  There is no right CVA tenderness or left CVA tenderness.  Musculoskeletal:     Comments: Low back pain - right lumbar paraspinal to right SI joint area  Skin:    General: Skin is warm and dry.     Findings: No rash.  Neurological:     Mental Status: She is alert.     Motor: No abnormal muscle tone.     Coordination: Coordination normal.  Psychiatric:        Mood and Affect: Mood normal.        Behavior: Behavior normal.      Results for orders placed or performed in visit on 11/01/22  Urine Culture   Specimen: Urine  Result Value Ref Range   MICRO NUMBER: [id number]    SPECIMEN QUALITY: Adequate    Sample Source URINE    STATUS: FINAL    Result: No Growth   POCT Urinalysis Dip Manual  Result Value Ref Range   Spec Grav, UA 1.020 1.010 - 1.025   pH, UA 5.0 5.0 - 8.0   Leukocytes, UA Large (3+) (A) Negative   Nitrite, UA Positive (A) Negative   Poct Protein ++100 (A) Negative, trace mg/dL   Poct Glucose Normal Normal mg/dL   Poct Ketones + small (A) Negative   Poct Urobilinogen Normal (A) Normal mg/dL   Poct Bilirubin Negative Negative   Poct Blood =250 (A) Negative, trace       Assessment & Plan:     ICD-10-CM   1. Dysuria  R30.0 Urine Culture    Urinalysis, Routine w reflex microscopic    Microalbumin / creatinine urine ratio    Ambulatory referral to Urology   continued discomfort, however not as severe, recent culture was neg, retest urine, recommend urology eval    2. History of kidney stones  Z87.442 Urinalysis, Routine w reflex microscopic    Ambulatory referral to Urology   none on CT scan    3. Hematuria, unspecified type  R31.9 Urine Culture    Urinalysis, Routine w reflex microscopic    Microalbumin / creatinine urine ratio  Ambulatory referral to Urology   gross hematuria on prior urine sample with + nitrites, leuk, protein, ketones with neg urine culture, retesting urine today    4. Lower abdominal pain  R10.30 Urine Culture    Urinalysis,  Routine w reflex microscopic    Ambulatory referral to Urology   suprapubic pressure - pt did not want pelvic exam today - recommended for complete evaluation, hx of hysterectomy, CT was unremarkable except bladder gas    5. Right flank pain  R10.9 Urine Culture    Urinalysis, Routine w reflex microscopic    Ambulatory referral to Urology   some improvement, may be MSK    6. Acute maxillary sinusitis, recurrence not specified  J01.00 doxycycline (VIBRA-TABS) 100 MG tablet   ttp on exam, sx for 1-2 weeks worsening despite allergy meds, tx with doxy, advised pt to change/double up on allergy meds ie claritin in am, zyrtec or xyzal pm    7. Bilateral low back pain without sciatica, unspecified chronicity  M54.50    all back pain may not be urinary, CT shows spondylosis, DDD - recommended ortho/pt eval    8. Abnormal CT scan, bladder  R93.41    gas in bladder - no recent catheterization - weeks of urinary sx with neg culture - ref to urology          Delsa Grana, PA-C 11/15/22 2:30 PM

## 2022-11-16 ENCOUNTER — Encounter: Payer: Self-pay | Admitting: Urology

## 2022-11-16 ENCOUNTER — Ambulatory Visit: Payer: Medicare Other | Admitting: Urology

## 2022-11-16 VITALS — BP 138/78 | HR 74 | Ht 64.0 in | Wt 239.0 lb

## 2022-11-16 DIAGNOSIS — R399 Unspecified symptoms and signs involving the genitourinary system: Secondary | ICD-10-CM

## 2022-11-16 DIAGNOSIS — R310 Gross hematuria: Secondary | ICD-10-CM

## 2022-11-16 DIAGNOSIS — R319 Hematuria, unspecified: Secondary | ICD-10-CM

## 2022-11-16 LAB — URINALYSIS, ROUTINE W REFLEX MICROSCOPIC
Bilirubin Urine: NEGATIVE
Glucose, UA: NEGATIVE
Hgb urine dipstick: NEGATIVE
Ketones, ur: NEGATIVE
Leukocytes,Ua: NEGATIVE
Nitrite: NEGATIVE
Protein, ur: NEGATIVE
Specific Gravity, Urine: 1.002 (ref 1.001–1.035)
pH: 7 (ref 5.0–8.0)

## 2022-11-16 LAB — MICROALBUMIN / CREATININE URINE RATIO
Creatinine, Urine: 21 mg/dL (ref 20–275)
Microalb Creat Ratio: 14 mcg/mg creat (ref <30)
Microalb, Ur: 0.3 mg/dL

## 2022-11-16 LAB — URINE CULTURE
MICRO NUMBER:: 14682185
SPECIMEN QUALITY:: ADEQUATE

## 2022-11-16 MED ORDER — GEMTESA 75 MG PO TABS: 75.0000 mg | ORAL_TABLET | Freq: Every day | ORAL | 0 refills | Status: DC

## 2022-11-16 NOTE — Progress Notes (Signed)
I, Patricia Fischer,acting as a scribe for Patricia Sons, MD.,have documented all relevant documentation on the behalf of Patricia Sons, MD,as directed by  Patricia Sons, MD while in the presence of Patricia Sons, MD.   11/16/22 2:57 PM   Patricia Fischer 1956/08/17 [id number]  Referring provider: Delsa Grana, PA-C [address] [place],  [place] [address]  Chief Complaint  Patient presents with   Hematuria    HPI: Patricia Fischer is a 67 y.o. female who presents for a further evaluation of gross hematuria and lower urinary symptoms.   She reports significant pain, urinary frequency, urgency, and occasional dribbling necessitating the use of a pad. These symptoms began approximately two weeks prior to the visit. Initially, she sought care from her primary care physician, suspecting a UTI. Despite the absence of a UTI, as indicated by negative test results, she was initially placed on medication, which was subsequently discontinued due to a lack of improvement. She also reports a history of pain in the lower pelvis, lower back, and up the left side. She notes nocturia every hour which is possibly related to diuretics.  The last occurrence of blood in the urine was approximately a week prior to the visit. A urine culture performed on 11/15/22 was negative. She reports a slight improvement in pain.  CT on 11/10/22 showed no signs of kidney stones but indicated the presence of gas in the bladder.   PMH: Past Medical History:  Diagnosis Date   Allergy    Anxiety    Asthma    Blood transfusion 1979   Chidbirth   Breast cancer, left breast ([facility]) 2011   DCIS   Chronic systolic CHF w/ Improved LVF 05/05/2015   NICM // Dx 04/2015 - EF initially 25-30% with RV dysfunction // f/u echo 08/2015 technically difficult, EF 35-40%, anterior, anteroseptal, apical and inferoapical severe hypokinesis suggestive of LAD territory ischemia/infarct, grade 1 DD, mild MR, RV normal. //  Echo 5/18: mild LVH, EF 30-35, Gr 1 DD // cMRI 12/18: EF 52    COPD (chronic obstructive pulmonary disease) (HCC)    Depression    DVT (deep venous thrombosis) ([facility]) 04/2015   a. Dx 04/2015 - patient returned 05/2015 after noncompliance with Xarelto.   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 Dx 04/2015 - patient returned 05/2015 after noncompliance with Xarelto.   Shortness of breath dyspnea    Syncope    a. Recurrent syncope in 2016 felt 2/2 orthostasis in setting of PE.    Surgical History: Past Surgical History:  Procedure Laterality Date   ABDOMINAL HYSTERECTOMY  1986   ANAL FISSURE REPAIR     ANTERIOR CERVICAL DECOMP/DISCECTOMY FUSION  01/23/2012   Procedure: ANTERIOR CERVICAL DECOMPRESSION/DISCECTOMY FUSION 1 LEVEL;  Surgeon: Elaina Hoops, MD;  Location: [facility];  Service: Neurosurgery;  Laterality: N/A;  Cervical five-six anterior cervical discectomy fusion with plating   APPENDECTOMY  BREAST BIOPSY Left 06/2016   US biopsy-PAPILLARY CYSTIC APOCRINE CHANGE    BREAST SURGERY Left 2011   Breast lumpectomy   CARDIAC CATHETERIZATION  2010   [facility]; pt states it was "clear"   CARDIAC CATHETERIZATION N/A 10/28/2015   Procedure: Right/Left Heart Cath and Coronary Angiography;  Surgeon: Belva Crome, MD;  Location: [facility];  Service: Cardiovascular;  Laterality: N/A;   CARPAL TUNNEL RELEASE Bilateral    [place], 1979   CHOLECYSTECTOMY     COLONOSCOPY  01/16/2009   Dr Bary Castilla   KNEE CARTILAGE SURGERY Left     SHOULDER ARTHROSCOPY WITH SUBACROMIAL DECOMPRESSION, ROTATOR CUFF REPAIR AND BICEP TENDON REPAIR Right 11/22/2013   Procedure: RIGHT SHOULDER ATHROSCOPY OPEN SUBSCAP REPAIR DELTA-PECTORAL ;  Surgeon: Augustin Schooling, MD;  Location: [facility];  Service: Orthopedics;  Laterality: Right;   TARSAL TUNNEL RELEASE Left 1990   TONSILLECTOMY     TUBAL LIGATION      Home Medications:  Allergies as of 11/16/2022       Reactions   Gadolinium Derivatives Shortness Of Breath, Nausea And Vomiting   Levaquin [levofloxacin In D5w] Itching   Was giving with vancomycin at same time - unsure which one pt had a reaction to   Sulfa Antibiotics Swelling   "Ears swelled up like Dumbo"   Vancomycin Itching   Was giving at same time as Levaquin - unsure which pt had reaction to   Aripiprazole Other (See Comments)   Dry mouth with sores   Levofloxacin Itching   Codeine Itching, Rash   Oxycodone Itching   Penicillins Hives   Has patient had a PCN reaction causing immediate rash, facial/tongue/throat swelling, SOB or lightheadedness with hypotension: Yes Has patient had a PCN reaction causing severe rash involving mucus membranes or skin necrosis: No Has patient had a PCN reaction that required hospitalization No Has patient had a PCN reaction occurring within the last 10 years: No If all of the above answers are "NO", then may proceed with Cephalosporin use.        Medication List        Accurate as of November 16, 2022  2:57 PM. If you have any questions, ask your nurse or doctor.          albuterol 108 (90 Base) MCG/ACT inhaler Commonly known as: VENTOLIN HFA TAKE 2 PUFFS BY MOUTH EVERY 6 HOURS AS NEEDED FOR WHEEZE OR SHORTNESS OF BREATH   ascorbic acid 500 MG tablet Commonly known as: VITAMIN C Take 500 mg by mouth daily.   carvedilol 6.25 MG tablet Commonly known as: COREG TAKE 1 TABLET BY MOUTH TWICE A DAY   cetirizine 10 MG tablet Commonly known as: ZYRTEC Take 10 mg by mouth daily.    doxycycline 100 MG tablet Commonly known as: VIBRA-TABS Take 1 tablet (100 mg total) by mouth 2 (two) times daily for 7 days.   fluconazole 150 MG tablet Commonly known as: DIFLUCAN Take 1 tablet (150 mg total) by mouth every 3 (three) days as needed (for vaginal itching/yeast infection sx).   fluticasone 50 MCG/ACT nasal spray Commonly known as: FLONASE SPRAY 2 SPRAYS INTO EACH NOSTRIL EVERY DAY   furosemide 40 MG tablet Commonly known as: LASIX Take 1 tablet (40 mg total) by mouth daily.   Gemtesa 75 MG Tabs Generic drug: Vibegron Take 1 tablet (75 mg total) by mouth daily. Started by: Patricia Sons, MD   phenazopyridine 100 MG tablet Commonly known  as: Pyridium Take 1-2 tablets (100-200 mg total) by mouth 3 (three) times daily as needed for pain.   rosuvastatin 20 MG tablet Commonly known as: CRESTOR Take 1 tablet (20 mg total) by mouth daily.   sacubitril-valsartan 24-26 MG Commonly known as: ENTRESTO Take 1 tablet by mouth 2 (two) times daily.   spironolactone 25 MG tablet Commonly known as: ALDACTONE TAKE 1 TABLET (25 MG TOTAL) BY MOUTH DAILY.   tamsulosin 0.4 MG Caps capsule Commonly known as: FLOMAX Take 1 capsule (0.4 mg total) by mouth daily.   tizanidine 2 MG capsule Commonly known as: ZANAFLEX Take 2 mg by mouth 3 (three) times daily as needed for muscle spasms.   traMADol 50 MG tablet Commonly known as: ULTRAM Take 50 mg by mouth every 4 (four) hours as needed.   Trelegy Ellipta 100-62.5-25 MCG/ACT Aepb Generic drug: Fluticasone-Umeclidin-Vilant INHALE 1 PUFF BY MOUTH EVERY DAY   vitamin B-12 500 MCG tablet Commonly known as: CYANOCOBALAMIN Take 2,500 mcg by mouth daily.   Xarelto 10 MG Tabs tablet Generic drug: rivaroxaban TAKE 1 TABLET BY MOUTH EVERY DAY        Allergies:  Allergies  Allergen Reactions   Gadolinium Derivatives Shortness Of Breath and Nausea And Vomiting   Levaquin [Levofloxacin In D5w] Itching    Was giving with  vancomycin at same time - unsure which one pt had a reaction to   Sulfa Antibiotics Swelling    "Ears swelled up like Dumbo"   Vancomycin Itching    Was giving at same time as Levaquin - unsure which pt had reaction to   Aripiprazole Other (See Comments)    Dry mouth with sores    Levofloxacin Itching   Codeine Itching and Rash   Oxycodone Itching   Penicillins Hives    Has patient had a PCN reaction causing immediate rash, facial/tongue/throat swelling, SOB or lightheadedness with hypotension: Yes Has patient had a PCN reaction causing severe rash involving mucus membranes or skin necrosis: No Has patient had a PCN reaction that required hospitalization No Has patient had a PCN reaction occurring within the last 10 years: No If all of the above answers are "NO", then may proceed with Cephalosporin use.    Family History: Family History  Problem Relation Age of Onset   Breast cancer Paternal 57    Breast cancer Mother 64   Migraines Mother    Cancer Mother    Hypertension Mother    Diabetes Mother    Rectal cancer Father    Cancer Father    COPD Father    Alcohol abuse Father    Heart disease Paternal Grandmother    Stroke Paternal Grandmother    Heart attack Paternal Grandmother    Aneurysm Paternal Grandfather    COPD Paternal Grandfather    Breast cancer Cousin    Breast cancer Cousin    Breast cancer Cousin    Diabetes Sister    Anesthesia problems Neg Hx     Social History:  reports that she has never smoked. She has never used smokeless tobacco. She reports that she does not currently use alcohol. She reports that she does not use drugs.   Physical Exam: BP 138/78   Pulse 74   Ht '5\' 4"'$  (1.626 m)   Wt 239 lb (108.4 kg)   BMI 41.02 kg/m   Constitutional:  Alert and oriented, No acute distress. HEENT: [place] AT, moist mucus membranes.  Trachea midline, no masses. Cardiovascular: No clubbing, cyanosis, or  edema. Respiratory: Normal respiratory effort, no  increased work of breathing. GI: Abdomen is soft, nontender, nondistended, no abdominal masses Skin: No rashes, bruises or suspicious lesions. Neurologic: Grossly intact, no focal deficits, moving all 4 extremities. Psychiatric: Normal mood and affect.  Laboratory Data: Lab Results  Component Value Date   WBC 8.1 03/15/2022   HGB 14.4 03/15/2022   HCT 43.4 03/15/2022   MCV 90.2 03/15/2022   PLT 192 03/15/2022    Lab Results  Component Value Date   CREATININE 0.78 10/06/2022    Lab Results  Component Value Date   HGBA1C 5.6 07/07/2020    Urinalysis    Component Value Date/Time   COLORURINE YELLOW 11/15/2022 1501   APPEARANCEUR CLEAR 11/15/2022 1501   LABSPEC 1.002 11/15/2022 1501   PHURINE 7.0 11/15/2022 1501   GLUCOSEU NEGATIVE 11/15/2022 1501   HGBUR NEGATIVE 11/15/2022 1501   BILIRUBINUR Negative 11/26/2021 1057   KETONESUR NEGATIVE 11/15/2022 1501   PROTEINUR NEGATIVE 11/15/2022 1501   UROBILINOGEN 0.2 11/26/2021 1057   UROBILINOGEN 0.2 05/04/2015 1745   NITRITE NEGATIVE 11/15/2022 1501   LEUKOCYTESUR NEGATIVE 11/15/2022 1501    Lab Results  Component Value Date   BACTERIA NONE SEEN 08/08/2018    Pertinent Imaging:  EXAM: CT ABDOMEN AND PELVIS WITHOUT CONTRAST   TECHNIQUE: Multidetector CT imaging of the abdomen and pelvis was performed following the standard protocol without IV contrast.   RADIATION DOSE REDUCTION: This exam was performed according to the departmental dose-optimization program which includes automated exposure control, adjustment of the mA and/or kV according to patient size and/or use of iterative reconstruction technique.   COMPARISON:  08/10/2017   FINDINGS: Lower chest: Elevated right hemidiaphragm with mild resulting atelectasis in the right lower lobe and right middle lobe.   Mild cardiomegaly.   Hepatobiliary: Cholecystectomy.  Otherwise unremarkable.   Pancreas: Unremarkable   Spleen: Unremarkable    Adrenals/Urinary Tract: Trace amount of gas in the otherwise empty urinary bladder, query recent catheterization. Noncontrast CT appearance of the kidneys and adrenal glands is normal. No urinary tract calculi observed. No hydronephrosis or hydroureter.   Stomach/Bowel: Sigmoid colon diverticulosis. Mild fatty prominence of the ileocecal valve. Focal fatty lesion along the medial wall of the ascending colon measuring 2.2 by 1.4 by 0.6 cm, probably a small lipoma.   Vascular/Lymphatic: Mild aortoiliac atherosclerotic vascular calcification.   Reproductive: Uterus absent.  Adnexa unremarkable.   Other: No supplemental non-categorized findings.   Musculoskeletal: Thoracolumbar spondylosis. Schmorl's node along the inferior endplate of L2 with surrounding sclerosis, subacute Schmorl's node not excluded given the lack of corticated margins. Nitrogen gas phenomenon in each intervertebral disc between T10 and L3. Lumbar spondylosis and degenerative disc disease results in bilateral foraminal impingement at all levels between L1 and L5.   IMPRESSION: 1. A specific cause for the patient's hematuria is not identified on today's noncontrast CT. 2. A trace amount of gas in the otherwise empty urinary bladder is likely from recent catheterization. 3. Elevated right hemidiaphragm with mild resulting atelectasis in the right lower lobe and right middle lobe. 4. Mild cardiomegaly. 5. Sigmoid colon diverticulosis. 6. Thoracolumbar spondylosis and degenerative disc disease causing bilateral foraminal impingement at all levels between L1 and L5. 7. Schmorl's node along the inferior endplate of L2 with surrounding marrow sclerosis but without a thin sclerotic rim, subacute Schmorl's node not excluded. 8. Small lipoma along the medial wall of the ascending colon. 9. Aortic atherosclerosis.   Aortic Atherosclerosis (ICD10-I70.0).     Electronically Signed  By: Van Clines M.D.   On:  11/10/2022 12:21   Assessment & Plan:    Gross hematuria Schedule cystoscopy  Lower urinary tract symptoms Cystoscopy as above Samples of Gemtesa 75 mg daily  Return for cystoscopy.   [facility] [address], [place], [place] [address] [phone]

## 2022-11-17 ENCOUNTER — Encounter: Payer: Self-pay | Admitting: Urology

## 2022-11-21 ENCOUNTER — Encounter: Payer: Self-pay | Admitting: Family Medicine

## 2022-11-22 ENCOUNTER — Telehealth: Payer: Self-pay

## 2022-11-22 ENCOUNTER — Telehealth: Payer: Medicare Other | Admitting: Physician Assistant

## 2022-11-22 NOTE — Telephone Encounter (Signed)
Called patient to inform her she can not do virtual due to not feeling better after the antibiotic she as given by Kristeen Miss. Erin, Adrian stated she rather see her in person to evaluate her. Pt denied to come in today so appointment was cancelled, she will call back to schedule when she is ready. Patient states she only feels everything in her head/face. I advised patient per Junie Panning PA request she had to be seen in person. Pt stated ok will cancel today.

## 2022-11-25 ENCOUNTER — Ambulatory Visit (INDEPENDENT_AMBULATORY_CARE_PROVIDER_SITE_OTHER): Payer: Medicare Other

## 2022-11-25 VITALS — Ht 64.5 in | Wt 239.0 lb

## 2022-11-25 DIAGNOSIS — Z0000 Encounter for general adult medical examination without abnormal findings: Secondary | ICD-10-CM | POA: Diagnosis not present

## 2022-11-25 NOTE — Progress Notes (Signed)
I connected with  [name] on 11/25/22 by a audio enabled telemedicine application and verified that I am speaking with the correct person using two identifiers.  Patient Location: Home  Provider Location: Office/Clinic  I discussed the limitations of evaluation and management by telemedicine. The patient expressed understanding and agreed to proceed.  Subjective:   Patricia Fischer is a 67 y.o. female who presents for Medicare Annual (Subsequent) preventive examination.  Review of Systems    Cardiac Risk Factors include: advanced age (>75men, >17 women);dyslipidemia;hypertension;obesity (BMI >30kg/m2)     Objective:    Today's Vitals   11/25/22 1532 11/25/22 1551  Weight: 239 lb (108.4 kg)   Height: 5' 4.5" (1.638 m)   PainSc:  5    Body mass index is 40.39 kg/m.     11/25/2022    3:38 PM 11/23/2021    4:11 PM 10/01/2021    6:29 PM 11/19/2020    3:10 PM 09/07/2017    6:40 PM 08/10/2017    3:17 PM 06/26/2017    2:10 PM  Advanced Directives  Does Patient Have a Medical Advance Directive? No No No No No No No  Would patient like information on creating a medical advance directive?  No - Patient declined  No - Patient declined No - Patient declined No - Patient declined     Current Medications (verified) Outpatient Encounter Medications as of 11/25/2022  Medication Sig   albuterol (VENTOLIN HFA) 108 (90 Base) MCG/ACT inhaler TAKE 2 PUFFS BY MOUTH EVERY 6 HOURS AS NEEDED FOR WHEEZE OR SHORTNESS OF BREATH   carvedilol (COREG) 6.25 MG tablet TAKE 1 TABLET BY MOUTH TWICE A DAY   cetirizine (ZYRTEC) 10 MG tablet Take 10 mg by mouth daily.   fluconazole (DIFLUCAN) 150 MG tablet Take 1 tablet (150 mg total) by mouth every 3 (three) days as needed (for vaginal itching/yeast infection sx).   fluticasone (FLONASE) 50 MCG/ACT nasal spray SPRAY 2 SPRAYS INTO EACH NOSTRIL EVERY DAY   furosemide (LASIX) 40 MG tablet Take 1 tablet (40 mg total) by mouth daily.   phenazopyridine  (PYRIDIUM) 100 MG tablet Take 1-2 tablets (100-200 mg total) by mouth 3 (three) times daily as needed for pain.   rosuvastatin (CRESTOR) 20 MG tablet Take 1 tablet (20 mg total) by mouth daily.   sacubitril-valsartan (ENTRESTO) 24-26 MG Take 1 tablet by mouth 2 (two) times daily.   spironolactone (ALDACTONE) 25 MG tablet TAKE 1 TABLET (25 MG TOTAL) BY MOUTH DAILY.   tamsulosin (FLOMAX) 0.4 MG CAPS capsule Take 1 capsule (0.4 mg total) by mouth daily.   tizanidine (ZANAFLEX) 2 MG capsule Take 2 mg by mouth 3 (three) times daily as needed for muscle spasms.   traMADol (ULTRAM) 50 MG tablet Take 50 mg by mouth every 4 (four) hours as needed.   TRELEGY ELLIPTA 100-62.5-25 MCG/ACT AEPB INHALE 1 PUFF BY MOUTH EVERY DAY   Vibegron (GEMTESA) 75 MG TABS Take 1 tablet (75 mg total) by mouth daily.   vitamin B-12 (CYANOCOBALAMIN) 500 MCG tablet Take 2,500 mcg by mouth daily.   vitamin C (ASCORBIC ACID) 500 MG tablet Take 500 mg by mouth daily.   XARELTO 10 MG TABS tablet TAKE 1 TABLET BY MOUTH EVERY DAY   No facility-administered encounter medications on file as of 11/25/2022.    Allergies (verified) Gadolinium derivatives, Levaquin [levofloxacin in d5w], Sulfa antibiotics, Vancomycin, Aripiprazole, Levofloxacin, Codeine, Oxycodone, and Penicillins   History: Past Medical History:  Diagnosis Date   Allergy  Anxiety    Asthma    Blood transfusion 1979   Chidbirth   Breast cancer, left breast ([place]) 2011   DCIS   Chronic systolic CHF w/ Improved LVF 05/05/2015   NICM // Dx 04/2015 - EF initially 25-30% with RV dysfunction // f/u echo 08/2015 technically difficult, EF 35-40%, anterior, anteroseptal, apical and inferoapical severe hypokinesis suggestive of LAD territory ischemia/infarct, grade 1 DD, mild MR, RV normal. // Echo 5/18: mild LVH, EF 30-35, Gr 1 DD // cMRI 12/18: EF 52    COPD (chronic obstructive pulmonary disease) (HCC)    Depression    DVT (deep venous thrombosis) ([place]) 04/2015   a.  Dx 04/2015 - patient returned 05/2015 after noncompliance with Xarelto.   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 Dx 04/2015 - patient returned 05/2015 after noncompliance with Xarelto.   Shortness of breath dyspnea    Syncope    a. Recurrent syncope in 2016 felt 2/2 orthostasis in setting of PE.   Past Surgical History:  Procedure Laterality Date   ABDOMINAL HYSTERECTOMY  1986   ANAL FISSURE REPAIR     ANTERIOR CERVICAL DECOMP/DISCECTOMY FUSION  01/23/2012   Procedure: ANTERIOR CERVICAL DECOMPRESSION/DISCECTOMY FUSION 1 LEVEL;  Surgeon: Elaina Hoops, MD;  Location: [facility];  Service: Neurosurgery;  Laterality: N/A;  Cervical five-six anterior cervical discectomy fusion with plating   APPENDECTOMY     BREAST BIOPSY Left 06/2016   US biopsy-PAPILLARY CYSTIC APOCRINE CHANGE    BREAST SURGERY Left 2011   Breast lumpectomy   CARDIAC CATHETERIZATION  2010   [facility]; pt states it was "clear"   CARDIAC CATHETERIZATION N/A 10/28/2015   Procedure: Right/Left Heart Cath and Coronary Angiography;  Surgeon: Belva Crome, MD;  Location: [facility];  Service: Cardiovascular;  Laterality: N/A;   CARPAL TUNNEL RELEASE Bilateral    [place], 1979   CHOLECYSTECTOMY     COLONOSCOPY  01/16/2009   Dr Bary Castilla   KNEE CARTILAGE SURGERY Left    SHOULDER ARTHROSCOPY WITH SUBACROMIAL DECOMPRESSION, ROTATOR CUFF REPAIR AND BICEP TENDON REPAIR Right 11/22/2013   Procedure: RIGHT SHOULDER ATHROSCOPY OPEN SUBSCAP REPAIR DELTA-PECTORAL ;  Surgeon:  Augustin Schooling, MD;  Location: [facility];  Service: Orthopedics;  Laterality: Right;   TARSAL TUNNEL RELEASE Left 1990   TONSILLECTOMY     TUBAL LIGATION     Family History  Problem Relation Age of Onset   Breast cancer Paternal 30    Breast cancer Mother 58   Migraines Mother    Cancer Mother    Hypertension Mother    Diabetes Mother    Rectal cancer Father    Cancer Father    COPD Father    Alcohol abuse Father    Heart disease Paternal Grandmother    Stroke Paternal Grandmother    Heart attack Paternal Grandmother    Aneurysm Paternal Grandfather    COPD Paternal Grandfather  Breast cancer Cousin    Breast cancer Cousin    Breast cancer Cousin    Diabetes Sister    Anesthesia problems Neg Hx    Social History   Socioeconomic History   Marital status: Widowed    Spouse name: Not on file   Number of children: 2   Years of education: 44   Highest education level: Some college, no degree  Occupational History   Occupation: retired  Tobacco Use   Smoking status: Never   Smokeless tobacco: Never  Vaping Use   Vaping Use: Never used  Substance and Sexual Activity   Alcohol use: Not Currently    Comment: occasional wine   Drug use: Never   Sexual activity: Not Currently    Birth control/protection: None  Other Topics Concern   Not on file  Social History Narrative   Not on file   Social Determinants of Health   Financial Resource Strain: Low Risk  (11/22/2022)   Overall Financial Resource Strain (CARDIA)    Difficulty of Paying Living Expenses: Not hard at all  Food Insecurity: No Food Insecurity (11/22/2022)   Hunger Vital Sign    Worried About Running Out of Food in the Last Year: Never true    [place] in the Last Year: Never true  Transportation Needs: No Transportation Needs (11/22/2022)   PRAPARE - Hydrologist (Medical): No    Lack of Transportation (Non-Medical): No  Physical Activity: Sufficiently Active  (11/25/2022)   Exercise Vital Sign    Days of Exercise per Week: 3 days    Minutes of Exercise per Session: 50 min  Stress: Stress Concern Present (11/25/2022)   [place]    Feeling of Stress : Rather much  Social Connections: Moderately Integrated (11/22/2022)   Social Connection and Isolation Panel [NHANES]    Frequency of Communication with Friends and Family: More than three times a week    Frequency of Social Gatherings with Friends and Family: More than three times a week    Attends Religious Services: More than 4 times per year    Active Member of [employer] or Organizations: Yes    Attends Archivist Meetings: More than 4 times per year    Marital Status: Widowed    Tobacco Counseling Counseling given: Not Answered   Clinical Intake:  Pre-visit preparation completed: Yes  Pain : 0-10 Pain Score: 5  Pain Type: Chronic pain Pain Location: Back Pain Descriptors / Indicators: Aching Pain Relieving Factors: medication  Pain Relieving Factors: medication  BMI - recorded: 40.39 Nutritional Risks: None Diabetes: No  How often do you need to have someone help you when you read instructions, pamphlets, or other written materials from your doctor or pharmacy?: 1 - Never  Diabetic?no  Interpreter Needed?: No  Information entered by :: B.Lyndee Herbst,LPN   Activities of Daily Living    11/25/2022    3:40 PM 11/22/2022   12:28 PM  In your present state of health, do you have any difficulty performing the following activities:  Hearing? 0 0  Vision? 0 0  Difficulty concentrating or making decisions? 0 0  Walking or climbing stairs? 0   Dressing or bathing? 0 0  Doing errands, shopping? 0 0  Preparing Food and eating ? N N  Using the Toilet? N N  In the past six months, have you accidently leaked urine? Y   Do you have problems  with loss of bowel control? N N  Managing your Medications? N N   Managing your Finances? N N  Housekeeping or managing your Housekeeping? N N    Patient Care Team: Delsa Grana, PA-C as PCP - General (Family Medicine) Jerline Pain, MD as PCP - Cardiology (Cardiology) Belva Crome, MD (Inactive) as Consulting Physician (Cardiology) Bensimhon, Shaune Pascal, MD as Consulting Physician (Cardiology)  Indicate any recent Medical Services you may have received from other than [facility] providers in the past year (date may be approximate).     Assessment:   This is a routine wellness examination for Patricia Fischer.  Hearing/Vision screen Hearing Screening - Comments:: Adequate hearing Vision Screening - Comments:: Adequate vision readers only Dr Graciella Belton   Dietary issues and exercise activities discussed: Current Exercise Habits: Home exercise routine, Type of exercise: walking, Time (Minutes): 30, Frequency (Times/Week): 3, Weekly Exercise (Minutes/Week): 90, Intensity: Mild, Exercise limited by: orthopedic condition(s);cardiac condition(s)   Goals Addressed   None    Depression Screen    11/25/2022    3:36 PM 11/15/2022    2:19 PM 11/01/2022   11:58 AM 06/14/2022    3:02 PM 03/15/2022   10:24 AM 01/26/2022    3:14 PM 11/26/2021   10:48 AM  PHQ 2/9 Scores  PHQ - 2 Score 0 0 0 0 2 2 4   PHQ- 9 Score 0 0 0 0 9 13 7     Fall Risk    11/22/2022   12:28 PM 11/15/2022    2:19 PM 11/01/2022   11:57 AM 06/14/2022    3:02 PM 03/15/2022   10:24 AM  Fall Risk   Falls in the past year? 0 0 0 0 0  Number falls in past yr:  0  0 0  Injury with Fall?  0  0 0  Risk for fall due to : No Fall Risks No Fall Risks No Fall Risks No Fall Risks No Fall Risks  Follow up Falls prevention discussed;Education provided Falls prevention discussed;Education provided;Falls evaluation completed Falls prevention discussed Falls prevention discussed;Education provided Falls prevention discussed;Education provided    FALL RISK PREVENTION PERTAINING TO THE HOME:  Any stairs in  or around the home? Yes  If so, are there any without handrails? Yes  Home free of loose throw rugs in walkways, pet beds, electrical cords, etc? Yes  Adequate lighting in your home to reduce risk of falls? Yes   ASSISTIVE DEVICES UTILIZED TO PREVENT FALLS:  Life alert? No  Use of a cane, walker or w/c? No  Grab bars in the bathroom? No  Shower chair or bench in shower? No  Elevated toilet seat or a handicapped toilet? No    Cognitive Function:        11/25/2022    3:51 PM  6CIT Screen  What Year? 0 points  What month? 0 points  What time? 0 points  Count back from 20 0 points  Months in reverse 0 points  Repeat phrase 2 points  Total Score 2 points    Immunizations Immunization History  Administered Date(s) Administered   Influenza Inj Mdck Quad Pf 06/21/2019   Influenza,inj,Quad PF,6+ Mos 07/01/2015, 05/17/2016, 06/26/2017, 08/08/2018   PNEUMOCOCCAL CONJUGATE-20 02/16/2021   Pneumococcal Polysaccharide-23 07/13/2007   Td 09/05/2004   Tdap 05/17/2016    TDAP status: Up to date  Flu Vaccine status: Up to date  Pneumococcal vaccine status: Up to date  Covid-19 vaccine status: Declined, Education has been provided regarding the importance of this  vaccine but patient still declined. Advised may receive this vaccine at local pharmacy or Health Dept.or vaccine clinic. Aware to provide a copy of the vaccination record if obtained from local pharmacy or Health Dept. Verbalized acceptance and understanding.  Qualifies for Shingles Vaccine? Yes   Zostavax completed No   Shingrix Completed?: No.    Education has been provided regarding the importance of this vaccine. Patient has been advised to call insurance company to determine out of pocket expense if they have not yet received this vaccine. Advised may also receive vaccine at local pharmacy or Health Dept. Verbalized acceptance and understanding.  Screening Tests Health Maintenance  Topic Date Due   COLONOSCOPY (Pts  45-83yrs Insurance coverage will need to be confirmed)  01/16/2014   MAMMOGRAM  07/08/2022   INFLUENZA VACCINE  12/04/2022 (Originally 04/05/2022)   Zoster Vaccines- Shingrix (1 of 2) 01/30/2023 (Originally 10/27/2004)   Medicare Annual Wellness (AWV)  11/25/2023   DTaP/Tdap/Td (3 - Td or Tdap) 05/17/2026   Pneumonia Vaccine 57+ Years old  Completed   DEXA SCAN  Completed   Hepatitis C Screening  Completed   HPV VACCINES  Aged Out   COVID-19 Vaccine  Discontinued    Health Maintenance  Health Maintenance Due  Topic Date Due   COLONOSCOPY (Pts 45-66yrs Insurance coverage will need to be confirmed)  01/16/2014   MAMMOGRAM  07/08/2022    Colorectal cancer screening: Type of screening: Colonoscopy. Completed yes. Repeat every 10 years  Mammogram status: Completed yes. Repeat every year  Bone Density status: Completed yes. Results reflect: Bone density results: NORMAL. Repeat every 5 years.  Lung Cancer Screening: (Low Dose CT Chest recommended if Age 48-80 years, 30 pack-year currently smoking OR have quit w/in 15years.) does not qualify.   Lung Cancer Screening Referral: no  Additional Screening:  Hepatitis C Screening: does not qualify; Completed yes  Vision Screening: Recommended annual ophthalmology exams for early detection of glaucoma and other disorders of the eye. Is the patient up to date with their annual eye exam?  Yes  Who is the provider or what is the name of the office in which the patient attends annual eye exams? Dr Ellin Mayhew If pt is not established with a provider, would they like to be referred to a provider to establish care? No .   Dental Screening: Recommended annual dental exams for proper oral hygiene  Community Resource Referral / Chronic Care Management: CRR required this visit?  No   CCM required this visit?  No      Plan:     I have personally reviewed and noted the following in the patient's chart:   Medical and social history Use of  alcohol, tobacco or illicit drugs  Current medications and supplements including opioid prescriptions. Patient is not currently taking opioid prescriptions. Functional ability and status Nutritional status Physical activity Advanced directives List of other physicians Hospitalizations, surgeries, and ER visits in previous 12 months Vitals Screenings to include cognitive, depression, and falls Referrals and appointments  In addition, I have reviewed and discussed with patient certain preventive protocols, quality metrics, and best practice recommendations. A written personalized care plan for preventive services as well as general preventive health recommendations were provided to patient.     Roger Shelter, LPN   [id number]   Nurse Notes: pt has no concerns or questions this visit

## 2022-11-25 NOTE — Patient Instructions (Addendum)
Ms. Patricia Fischer , Thank you for taking time to come for your Medicare Wellness Visit. I appreciate your ongoing commitment to your health goals. Please review the following plan we discussed and let me know if I can assist you in the future.   These are the goals we discussed:  Goals      Weight (lb) < 200 lb (90.7 kg)     Pt would like to lose weight over the year with healthy eating and physical activity         This is a list of the screening recommended for you and due dates:  Health Maintenance  Topic Date Due   Colon Cancer Screening  01/16/2014   Mammogram  07/08/2022   Flu Shot  12/04/2022*   Zoster (Shingles) Vaccine (1 of 2) 01/30/2023*   Medicare Annual Wellness Visit  11/25/2023   DTaP/Tdap/Td vaccine (3 - Td or Tdap) 05/17/2026   Pneumonia Vaccine  Completed   DEXA scan (bone density measurement)  Completed   Hepatitis C Screening: USPSTF Recommendation to screen - Ages 54-79 yo.  Completed   HPV Vaccine  Aged Out   COVID-19 Vaccine  Discontinued  *Topic was postponed. The date shown is not the original due date.    Advanced directives: no  Conditions/risks identified: none  Next appointment: Follow up in one year for your annual wellness visit 12/01/2023 @ 2pm telephone   Preventive Care 65 Years and Older, Female Preventive care refers to lifestyle choices and visits with your health care provider that can promote health and wellness. What does preventive care include? A yearly physical exam. This is also called an annual well check. Dental exams once or twice a year. Routine eye exams. Ask your health care provider how often you should have your eyes checked. Personal lifestyle choices, including: Daily care of your teeth and gums. Regular physical activity. Eating a healthy diet. Avoiding tobacco and drug use. Limiting alcohol use. Practicing safe sex. Taking low-dose aspirin every day. Taking vitamin and mineral supplements as recommended by your  health care provider. What happens during an annual well check? The services and screenings done by your health care provider during your annual well check will depend on your age, overall health, lifestyle risk factors, and family history of disease. Counseling  Your health care provider may ask you questions about your: Alcohol use. Tobacco use. Drug use. Emotional well-being. Home and relationship well-being. Sexual activity. Eating habits. History of falls. Memory and ability to understand (cognition). Work and work Statistician. Reproductive health. Screening  You may have the following tests or measurements: Height, weight, and BMI. Blood pressure. Lipid and cholesterol levels. These may be checked every 5 years, or more frequently if you are over 82 years old. Skin check. Lung cancer screening. You may have this screening every year starting at age 79 if you have a 30-pack-year history of smoking and currently smoke or have quit within the past 15 years. Fecal occult blood test (FOBT) of the stool. You may have this test every year starting at age 17. Flexible sigmoidoscopy or colonoscopy. You may have a sigmoidoscopy every 5 years or a colonoscopy every 10 years starting at age 30. Hepatitis C blood test. Hepatitis B blood test. Sexually transmitted disease (STD) testing. Diabetes screening. This is done by checking your blood sugar (glucose) after you have not eaten for a while (fasting). You may have this done every 1-3 years. Bone density scan. This is done to screen for osteoporosis. You  may have this done starting at age 109. Mammogram. This may be done every 1-2 years. Talk to your health care provider about how often you should have regular mammograms. Talk with your health care provider about your test results, treatment options, and if necessary, the need for more tests. Vaccines  Your health care provider may recommend certain vaccines, such as: Influenza vaccine.  This is recommended every year. Tetanus, diphtheria, and acellular pertussis (Tdap, Td) vaccine. You may need a Td booster every 10 years. Zoster vaccine. You may need this after age 54. Pneumococcal 13-valent conjugate (PCV13) vaccine. One dose is recommended after age 44. Pneumococcal polysaccharide (PPSV23) vaccine. One dose is recommended after age 90. Talk to your health care provider about which screenings and vaccines you need and how often you need them. This information is not intended to replace advice given to you by your health care provider. Make sure you discuss any questions you have with your health care provider. Document Released: 09/18/2015 Document Revised: 05/11/2016 Document Reviewed: 06/23/2015 Elsevier Interactive Patient Education  2017 [place] Prevention in the Home Falls can cause injuries. They can happen to people of all ages. There are many things you can do to make your home safe and to help prevent falls. What can I do on the outside of my home? Regularly fix the edges of walkways and driveways and fix any cracks. Remove anything that might make you trip as you walk through a door, such as a raised step or threshold. Trim any bushes or trees on the path to your home. Use bright outdoor lighting. Clear any walking paths of anything that might make someone trip, such as rocks or tools. Regularly check to see if handrails are loose or broken. Make sure that both sides of any steps have handrails. Any raised decks and porches should have guardrails on the edges. Have any leaves, snow, or ice cleared regularly. Use sand or salt on walking paths during winter. Clean up any spills in your garage right away. This includes oil or grease spills. What can I do in the bathroom? Use night lights. Install grab bars by the toilet and in the tub and shower. Do not use towel bars as grab bars. Use non-skid mats or decals in the tub or shower. If you need to sit  down in the shower, use a plastic, non-slip stool. Keep the floor dry. Clean up any water that spills on the floor as soon as it happens. Remove soap buildup in the tub or shower regularly. Attach bath mats securely with double-sided non-slip rug tape. Do not have throw rugs and other things on the floor that can make you trip. What can I do in the bedroom? Use night lights. Make sure that you have a light by your bed that is easy to reach. Do not use any sheets or blankets that are too big for your bed. They should not hang down onto the floor. Have a firm chair that has side arms. You can use this for support while you get dressed. Do not have throw rugs and other things on the floor that can make you trip. What can I do in the kitchen? Clean up any spills right away. Avoid walking on wet floors. Keep items that you use a lot in easy-to-reach places. If you need to reach something above you, use a strong step stool that has a grab bar. Keep electrical cords out of the way. Do not use floor  polish or wax that makes floors slippery. If you must use wax, use non-skid floor wax. Do not have throw rugs and other things on the floor that can make you trip. What can I do with my stairs? Do not leave any items on the stairs. Make sure that there are handrails on both sides of the stairs and use them. Fix handrails that are broken or loose. Make sure that handrails are as long as the stairways. Check any carpeting to make sure that it is firmly attached to the stairs. Fix any carpet that is loose or worn. Avoid having throw rugs at the top or bottom of the stairs. If you do have throw rugs, attach them to the floor with carpet tape. Make sure that you have a light switch at the top of the stairs and the bottom of the stairs. If you do not have them, ask someone to add them for you. What else can I do to help prevent falls? Wear shoes that: Do not have high heels. Have rubber bottoms. Are  comfortable and fit you well. Are closed at the toe. Do not wear sandals. If you use a stepladder: Make sure that it is fully opened. Do not climb a closed stepladder. Make sure that both sides of the stepladder are locked into place. Ask someone to hold it for you, if possible. Clearly mark and make sure that you can see: Any grab bars or handrails. First and last steps. Where the edge of each step is. Use tools that help you move around (mobility aids) if they are needed. These include: Canes. Walkers. Scooters. Crutches. Turn on the lights when you go into a dark area. Replace any light bulbs as soon as they burn out. Set up your furniture so you have a clear path. Avoid moving your furniture around. If any of your floors are uneven, fix them. If there are any pets around you, be aware of where they are. Review your medicines with your doctor. Some medicines can make you feel dizzy. This can increase your chance of falling. Ask your doctor what other things that you can do to help prevent falls. This information is not intended to replace advice given to you by your health care provider. Make sure you discuss any questions you have with your health care provider. Document Released: 06/18/2009 Document Revised: 01/28/2016 Document Reviewed: 09/26/2014 Elsevier Interactive Patient Education  2017 [employer].

## 2022-12-08 ENCOUNTER — Encounter: Payer: Self-pay | Admitting: Urology

## 2022-12-08 ENCOUNTER — Ambulatory Visit: Payer: Medicare Other | Admitting: Urology

## 2022-12-08 VITALS — BP 145/86 | HR 91 | Ht 64.0 in | Wt 239.0 lb

## 2022-12-08 DIAGNOSIS — R310 Gross hematuria: Secondary | ICD-10-CM | POA: Diagnosis not present

## 2022-12-08 LAB — URINALYSIS, COMPLETE
Bilirubin, UA: NEGATIVE
Glucose, UA: NEGATIVE
Ketones, UA: NEGATIVE
Leukocytes,UA: NEGATIVE
Nitrite, UA: NEGATIVE
Protein,UA: NEGATIVE
RBC, UA: NEGATIVE
Specific Gravity, UA: 1.02 (ref 1.005–1.030)
Urobilinogen, Ur: 1 mg/dL (ref 0.2–1.0)
pH, UA: 7 (ref 5.0–7.5)

## 2022-12-08 LAB — MICROSCOPIC EXAMINATION: Epithelial Cells (non renal): >10 /hpf — AB (ref 0–10)

## 2022-12-08 NOTE — Progress Notes (Signed)
   12/08/22  CC:  Chief Complaint  Patient presents with   Cysto    HPI: Refer to my previous note 11/16/2022.  She did not take the Gemtesa samples as she read in the insert that it could cause a UTI.  UA today negative WBCs/RBCs; >10 epis  Blood pressure (!) 145/86, pulse 91, height 5\' 4"  (1.626 m), weight 239 lb (108.4 kg). NED. A&Ox3.   No respiratory distress   Abd soft, NT, ND Normal external genitalia with patent urethral meatus  Cystoscopy Procedure Note  Patient identification was confirmed, informed consent was obtained, and patient was prepped using Betadine solution.  Lidocaine jelly was administered per urethral meatus.    Procedure: - Flexible cystoscope introduced, without any difficulty.   - Thorough search of the bladder revealed:    normal urethral meatus    normal urothelium    no stones    no ulcers     no tumors    no urethral polyps    no trabeculation  - Ureteral orifices were normal in position and appearance.  Post-Procedure: - Patient tolerated the procedure well  Assessment/ Plan: No abnormalities on cystoscopy UA today clear We discussed her voiding symptoms are most likely secondary to overactive bladder She will take the Gemtesa samples as we discussed there is no strong evidence that would an etiology for recurrent UTIs She instructed to call for recurrent symptoms or hematuria If she has recurrent documented hematuria will schedule CT urogram Follow-up with me 1 month with a repeat UA   [name], MD

## 2022-12-08 NOTE — Patient Instructions (Signed)

## 2022-12-22 ENCOUNTER — Other Ambulatory Visit: Payer: Self-pay

## 2022-12-22 MED ORDER — CARVEDILOL 6.25 MG PO TABS: 6.2500 mg | ORAL_TABLET | Freq: Two times a day (BID) | ORAL | 3 refills | Status: DC

## 2023-01-11 ENCOUNTER — Encounter: Payer: Self-pay | Admitting: Urology

## 2023-01-11 ENCOUNTER — Ambulatory Visit: Payer: Medicare Other | Admitting: Urology

## 2023-01-11 VITALS — BP 117/81 | HR 86 | Ht 65.0 in | Wt 239.0 lb

## 2023-01-11 DIAGNOSIS — R310 Gross hematuria: Secondary | ICD-10-CM | POA: Diagnosis not present

## 2023-01-11 DIAGNOSIS — N3281 Overactive bladder: Secondary | ICD-10-CM | POA: Diagnosis not present

## 2023-01-11 NOTE — Progress Notes (Signed)
I, Patricia Fischer, acting as a Neurosurgeon for Patricia Altes, MD., have documented all relevant documentation on the behalf of Patricia Altes, MD, as directed by  Patricia Altes, MD while in the presence of Patricia Altes, MD.   01/11/2023 2:30 PM   Patricia Fischer 1956/06/01 [id number]  Referring provider: Danelle Berry, PA-C [address] [place],  [place] [address]  Chief Complaint  Patient presents with   Over Active Bladder    Urologic History:  Gross hematuria -CT on 11/10/22 showed no signs of kidney stones but indicated a trace of gas in the bladder.  Lower urinary tract symptoms -Controlled with Gemtesa.  HPI: Patricia Fischer is a 67 y.o. female who presents for 1 month follow up.  Initially seen March 2024 for storage related avoiding symptoms and gross hematuria. Prior non-contrast CT was negative. Underwent cystoscopy 12/08/2022 which showed no abnormalities. She was given a trial of Gemtesa for her storage related voiding symptoms. She is unclear if Patricia Fischer has helped her symptoms, but did note resolution of bladder pressure and pressure after voiding. Denies gross hematuria.   PMH: Past Medical History:  Diagnosis Date   Allergy    Anxiety    Asthma    Blood transfusion 1979   Chidbirth   Breast cancer, left breast (HCC) 2011   DCIS   Chronic systolic CHF w/ Improved LVF 05/05/2015   NICM // Dx 04/2015 - EF initially 25-30% with RV dysfunction // f/u echo 08/2015 technically difficult, EF 35-40%, anterior, anteroseptal, apical and inferoapical severe hypokinesis suggestive of LAD territory ischemia/infarct, grade 1 DD, mild MR, RV normal. // Echo 5/18: mild LVH, EF 30-35, Gr 1 DD // cMRI 12/18: EF 52    COPD (chronic obstructive pulmonary disease) (HCC)    Depression    DVT (deep venous thrombosis) (HCC) 04/2015   a. Dx 04/2015 - patient returned 05/2015 after noncompliance with Xarelto.   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 Dx 04/2015 - patient returned 05/2015 after noncompliance with Xarelto.   Shortness of breath dyspnea    Syncope    a. Recurrent syncope in 2016 felt 2/2 orthostasis in setting of PE.    Surgical History: Past Surgical History:  Procedure Laterality Date   ABDOMINAL HYSTERECTOMY  1986   ANAL FISSURE REPAIR     ANTERIOR CERVICAL DECOMP/DISCECTOMY FUSION  01/23/2012   Procedure: ANTERIOR CERVICAL DECOMPRESSION/DISCECTOMY FUSION 1 LEVEL;  Surgeon: Mariam Dollar, MD;  Location: [facility];  Service: Neurosurgery;  Laterality: N/A;  Cervical five-six anterior cervical discectomy fusion with plating   APPENDECTOMY     BREAST BIOPSY Left 06/2016   US biopsy-PAPILLARY CYSTIC APOCRINE CHANGE    BREAST SURGERY Left 2011   Breast lumpectomy   CARDIAC CATHETERIZATION  2010   [facility]; pt states it was "clear"   CARDIAC CATHETERIZATION N/A 10/28/2015   Procedure: Right/Left Heart Cath and Coronary Angiography;  Surgeon: Lyn Records, MD;  Location: [facility];  Service: Cardiovascular;  Laterality: N/A;   CARPAL TUNNEL RELEASE Bilateral    CESAREAN SECTION  1982, 1979   CHOLECYSTECTOMY     COLONOSCOPY  01/16/2009   Dr Lemar Livings   KNEE CARTILAGE SURGERY Left    SHOULDER ARTHROSCOPY WITH SUBACROMIAL DECOMPRESSION, ROTATOR CUFF REPAIR AND BICEP TENDON REPAIR Right 11/22/2013   Procedure: RIGHT SHOULDER ATHROSCOPY OPEN SUBSCAP REPAIR DELTA-PECTORAL ;  Surgeon: Verlee Rossetti, MD;  Location: [facility];  Service: Orthopedics;  Laterality: Right;   TARSAL TUNNEL RELEASE  Left 1990   TONSILLECTOMY     TUBAL LIGATION      Home Medications:  Allergies as of 01/11/2023       Reactions   Gadolinium Derivatives Shortness Of Breath, Nausea And Vomiting   Levaquin [levofloxacin In D5w] Itching   Was giving with vancomycin at same time - unsure which one pt had a reaction to   Sulfa Antibiotics Swelling   "Ears swelled up like Dumbo"   Vancomycin Itching   Was giving at same time as Levaquin - unsure which pt had reaction to   Aripiprazole Other (See Comments)   Dry mouth with sores   Levofloxacin Itching   Codeine Itching, Rash   Oxycodone Itching   Penicillins Hives   Has patient had a PCN reaction causing immediate rash, facial/tongue/throat swelling, SOB or lightheadedness with hypotension: Yes Has patient had a PCN reaction causing severe rash involving mucus membranes or skin necrosis: No Has patient had a PCN reaction that required hospitalization No Has patient had a PCN reaction occurring within the last 10 years: No If all of the above answers are "NO", then may proceed with Cephalosporin use.        Medication List        Accurate as of Jan 11, 2023  2:30 PM. If you have any questions, ask your nurse or doctor.          albuterol 108 (90 Base) MCG/ACT inhaler Commonly known as: VENTOLIN HFA TAKE 2 PUFFS BY MOUTH EVERY 6 HOURS AS NEEDED FOR WHEEZE OR SHORTNESS OF BREATH   ascorbic acid 500 MG tablet Commonly known as: VITAMIN C Take 500 mg by mouth daily.   carvedilol 6.25 MG tablet Commonly known as: COREG Take 1 tablet (6.25 mg total) by mouth 2 (two) times daily.   cetirizine 10 MG tablet Commonly known as: ZYRTEC Take 10 mg by mouth daily.   fluticasone 50 MCG/ACT nasal spray Commonly known as: FLONASE SPRAY 2 SPRAYS INTO EACH NOSTRIL EVERY DAY   furosemide 40 MG tablet Commonly known as: LASIX Take 1 tablet (40 mg total) by mouth daily.   rosuvastatin 20 MG tablet Commonly known as: CRESTOR Take 1 tablet (20 mg  total) by mouth daily.   sacubitril-valsartan 24-26 MG Commonly known as: ENTRESTO Take 1 tablet by mouth 2 (two) times daily.   spironolactone 25 MG tablet Commonly known as: ALDACTONE TAKE 1 TABLET (25 MG TOTAL) BY MOUTH DAILY.   tizanidine 2 MG capsule Commonly known as: ZANAFLEX Take 2 mg by mouth 3 (three) times daily as needed for muscle spasms.   traMADol 50 MG tablet Commonly known as: ULTRAM Take 50 mg by mouth every 4 (four) hours as needed.   Trelegy Ellipta 100-62.5-25 MCG/ACT Aepb Generic drug: Fluticasone-Umeclidin-Vilant INHALE 1 PUFF BY MOUTH EVERY DAY   vitamin B-12 500 MCG tablet Commonly known as: CYANOCOBALAMIN Take 2,500 mcg by mouth daily.  Xarelto 10 MG Tabs tablet Generic drug: rivaroxaban TAKE 1 TABLET BY MOUTH EVERY DAY        Allergies:  Allergies  Allergen Reactions   Gadolinium Derivatives Shortness Of Breath and Nausea And Vomiting   Levaquin [Levofloxacin In D5w] Itching    Was giving with vancomycin at same time - unsure which one pt had a reaction to   Sulfa Antibiotics Swelling    "Ears swelled up like Dumbo"   Vancomycin Itching    Was giving at same time as Levaquin - unsure which pt had reaction to   Aripiprazole Other (See Comments)    Dry mouth with sores    Levofloxacin Itching   Codeine Itching and Rash   Oxycodone Itching   Penicillins Hives    Has patient had a PCN reaction causing immediate rash, facial/tongue/throat swelling, SOB or lightheadedness with hypotension: Yes Has patient had a PCN reaction causing severe rash involving mucus membranes or skin necrosis: No Has patient had a PCN reaction that required hospitalization No Has patient had a PCN reaction occurring within the last 10 years: No If all of the above answers are "NO", then may proceed with Cephalosporin use.    Family History: Family History  Problem Relation Age of Onset   Breast cancer Paternal Aunt    Breast cancer Mother 80   Migraines  Mother    Cancer Mother    Hypertension Mother    Diabetes Mother    Rectal cancer Father    Cancer Father    COPD Father    Alcohol abuse Father    Heart disease Paternal Grandmother    Stroke Paternal Grandmother    Heart attack Paternal Grandmother    Aneurysm Paternal Grandfather    COPD Paternal Grandfather    Breast cancer Cousin    Breast cancer Cousin    Breast cancer Cousin    Diabetes Sister    Anesthesia problems Neg Hx     Social History:  reports that she has never smoked. She has never used smokeless tobacco. She reports that she does not currently use alcohol. She reports that she does not use drugs.   Physical Exam: BP 117/81   Pulse 86   Ht 5\' 5"  (1.651 m)   Wt 239 lb (108.4 kg)   BMI 39.77 kg/m   Constitutional:  Alert and oriented, No acute distress. HEENT: [place] AT Respiratory: Normal respiratory effort, no increased work of breathing. Psychiatric: Normal mood and affect.   Assessment & Plan:    1. Overactive bladder Will have her stop Gemtesa and if voiding symptoms return, she will call in for an RX. 3 month follow up with repeat UA; call earlier for worsening symptoms or recurrent hematuria.   I have reviewed the above documentation for accuracy and completeness, and I agree with the above.   Patricia Altes, MD  [facility] [address] [place], [place] [address] [phone]

## 2023-01-13 DIAGNOSIS — M1712 Unilateral primary osteoarthritis, left knee: Secondary | ICD-10-CM | POA: Diagnosis not present

## 2023-01-31 ENCOUNTER — Encounter: Payer: Self-pay | Admitting: Family Medicine

## 2023-01-31 ENCOUNTER — Ambulatory Visit: Payer: Self-pay | Admitting: *Deleted

## 2023-01-31 NOTE — Telephone Encounter (Signed)
Appointment till 02/06/23

## 2023-01-31 NOTE — Telephone Encounter (Signed)
This type of problem would need in person assessment and likely some lab work.  Not appropriate for virtual assessment. Recommend she go to a local UC

## 2023-01-31 NOTE — Telephone Encounter (Signed)
  Chief Complaint: leg/ankle cramping Symptoms: patient is not sleeping- waking every hour with cramping- sometimes in the abdomen Frequency: 3 weeks Pertinent Negatives: Patient denies no swelling, no chest pain, breathing problems Disposition: [] ED /[] Urgent Care (no appt availability in office) / [x] Appointment(In office/virtual)/ []  [facility]/ [] Home Care/ [] Refused Recommended Disposition /[] [facility]/ []  Follow-up with PCP Additional Notes: Patient is presently out of town- she is requesting appointment for her return.  Patient advised make sure she is not getting dehydrated- get plenty of fluids- replace electrolytes. Be seen if she gets worse- she states her family will make sure of that. Advised contact urology if she feels she is waking to urinate and not just cramping.

## 2023-01-31 NOTE — Telephone Encounter (Signed)
Summary: Pt having really bad leg cramps, in ankles and stomach   Pt states she has tried to send a message thru mychart and will not go thru, she is trying to get PCP to call her in meds for cramps in her legs, ankles and stomach. [place] beach and [place]. She says she is drinking plenty of fluid, she wants a fu call from a nurse but states she is on the beach and she may not here the call and to leave a message. [phone]. Offer virtual appt but declines.     Reason for Disposition  [1] MODERATE pain (e.g., interferes with normal activities, limping) AND [2] present > 3 days  Answer Assessment - Initial Assessment Questions 1. ONSET: "When did the pain start?"      3 weeks  2. LOCATION: "Where is the pain located?"      Cramps in legs -Legs- ankle, stomach 3. PAIN: "How bad is the pain?"    (Scale 1-10; or mild, moderate, severe)   -  MILD (1-3): doesn't interfere with normal activities    -  MODERATE (4-7): interferes with normal activities (e.g., work or school) or awakens from sleep, limping    -  SEVERE (8-10): excruciating pain, unable to do any normal activities, unable to walk     Unable to sleep- or stretch legs 4. WORK OR EXERCISE: "Has there been any recent work or exercise that involved this part of the body?"      Chronic problems 5. CAUSE: "What do you think is causing the leg pain?"     Not sure- patient states she has had them since she had DVT- she takes Zanaflex at night  6. OTHER SYMPTOMS: "Do you have any other symptoms?" (e.g., chest pain, back pain, breathing difficulty, swelling, rash, fever, numbness, weakness)     Back pain with walking  Protocols used: Leg Pain-A-AH

## 2023-01-31 NOTE — Telephone Encounter (Signed)
Pt refused urgent care at this time and will go if things get worse. She will keep the in office appt that is already scheduled for next week.

## 2023-02-03 ENCOUNTER — Other Ambulatory Visit: Payer: Self-pay | Admitting: Family Medicine

## 2023-02-03 DIAGNOSIS — Z87442 Personal history of urinary calculi: Secondary | ICD-10-CM

## 2023-02-06 ENCOUNTER — Ambulatory Visit (INDEPENDENT_AMBULATORY_CARE_PROVIDER_SITE_OTHER): Payer: Medicare Other | Admitting: Family Medicine

## 2023-02-06 ENCOUNTER — Encounter: Payer: Self-pay | Admitting: Family Medicine

## 2023-02-06 VITALS — BP 108/72 | HR 97 | Temp 98.5°F | Resp 16 | Ht 65.0 in | Wt 234.4 lb

## 2023-02-06 DIAGNOSIS — R290 Tetany: Secondary | ICD-10-CM | POA: Diagnosis not present

## 2023-02-06 DIAGNOSIS — M545 Low back pain, unspecified: Secondary | ICD-10-CM | POA: Diagnosis not present

## 2023-02-06 DIAGNOSIS — M62838 Other muscle spasm: Secondary | ICD-10-CM

## 2023-02-06 LAB — POCT URINALYSIS DIPSTICK
Bilirubin, UA: NEGATIVE
Glucose, UA: NEGATIVE
Ketones, UA: NEGATIVE
Nitrite, UA: NEGATIVE
Protein, UA: NEGATIVE
Spec Grav, UA: 1.01 (ref 1.010–1.025)
Urobilinogen, UA: 0.2 E.U./dL
pH, UA: 5 (ref 5.0–8.0)

## 2023-02-06 NOTE — Progress Notes (Signed)
Patient ID: Patricia Fischer, female    DOB: November 25, 1955, 67 y.o.   MRN: [id number]  PCP: Danelle Berry, PA-C  Chief Complaint  Patient presents with   Leg Pain    Both leg cramping onset for 3 weeks unable to sleep   Back Pain    Bilateral but mainly low right side    Subjective:   Patricia Fischer is a 67 y.o. female, presents to clinic with CC of the following:  HPI  Muscle cramps and spasms in legs, feet and hands, cramping waking her up at night, even her ab muscles can spasm.  She does have muscle relaxers and when she takes them it helps her sleep better but she still wakes up multiple times at night to use the bathroom and wakes up due to muscle spasms and cramps  She is on entresto, spironolactone and lasix prn-rarely uses Lasix and does not have a potassium supplement, she drinks ample water throughout the day Not on potassium with lasix, no taking any vit d or calcium   She also complains of low back pain that is achy and sore today she refuses a referral to Ortho or to physical therapy  Patient Active Problem List   Diagnosis Date Noted   Pain of right shoulder joint on movement 08/04/2022   Osteoarthritis of knee 03/17/2022   Claustrophobia 02/09/2022   Pain in joint of left knee 12/21/2021   Derangement of left knee 12/21/2021   Chronic obstructive pulmonary disease (HCC) 01/22/2020   Stress incontinence 12/27/2019   Hyperlipidemia 12/27/2019   Muscle spasms of lower extremity 12/27/2019   Class 3 severe obesity with body mass index (BMI) of 40.0 to 44.9 in adult (HCC) 07/12/2017   Allergic rhinitis 06/26/2017   Hematuria 07/27/2016   Degenerative joint disease (DJD) of hip 03/31/2016   Hx of breast cancer 03/22/2016   History of pulmonary embolism 10/27/2015   HTN (hypertension) 10/07/2015   Depression, major, recurrent, moderate (HCC) 06/11/2015   Anemia 05/15/2015   Do not resuscitate status 05/15/2015   HFrEF (heart failure with reduced ejection  fraction) (HCC) 05/05/2015   Left leg DVT (HCC) 05/05/2015   Pulmonary hypertension (HCC)    Saddle pulmonary embolus (HCC)    LBBB (left bundle branch block)-rate related  04/19/2015   Chronic migraine without aura, with intractable migraine, so stated, with status migrainosus 11/04/2014   Subscapularis (muscle) sprain 11/22/2013      Current Outpatient Medications:    albuterol (VENTOLIN HFA) 108 (90 Base) MCG/ACT inhaler, TAKE 2 PUFFS BY MOUTH EVERY 6 HOURS AS NEEDED FOR WHEEZE OR SHORTNESS OF BREATH, Disp: 8.5 each, Rfl: 2   carvedilol (COREG) 6.25 MG tablet, Take 1 tablet (6.25 mg total) by mouth 2 (two) times daily., Disp: 180 tablet, Rfl: 3   cetirizine (ZYRTEC) 10 MG tablet, Take 10 mg by mouth daily., Disp: , Rfl:    fluticasone (FLONASE) 50 MCG/ACT nasal spray, SPRAY 2 SPRAYS INTO EACH NOSTRIL EVERY DAY, Disp: 48 mL, Rfl: 1   furosemide (LASIX) 40 MG tablet, Take 1 tablet (40 mg total) by mouth daily., Disp: 10 tablet, Rfl: 0   rosuvastatin (CRESTOR) 20 MG tablet, Take 1 tablet (20 mg total) by mouth daily., Disp: 90 tablet, Rfl: 3   sacubitril-valsartan (ENTRESTO) 24-26 MG, Take 1 tablet by mouth 2 (two) times daily., Disp: 180 tablet, Rfl: 3   spironolactone (ALDACTONE) 25 MG tablet, TAKE 1 TABLET (25 MG TOTAL) BY MOUTH DAILY., Disp: 90 tablet, Rfl:  3   tizanidine (ZANAFLEX) 2 MG capsule, Take 2 mg by mouth 3 (three) times daily as needed for muscle spasms., Disp: , Rfl:    traMADol (ULTRAM) 50 MG tablet, Take 50 mg by mouth every 4 (four) hours as needed., Disp: , Rfl:    TRELEGY ELLIPTA 100-62.5-25 MCG/ACT AEPB, INHALE 1 PUFF BY MOUTH EVERY DAY, Disp: 60 each, Rfl: 1   vitamin B-12 (CYANOCOBALAMIN) 500 MCG tablet, Take 2,500 mcg by mouth daily., Disp: , Rfl:    vitamin C (ASCORBIC ACID) 500 MG tablet, Take 500 mg by mouth daily., Disp: , Rfl:    XARELTO 10 MG TABS tablet, TAKE 1 TABLET BY MOUTH EVERY DAY, Disp: 90 tablet, Rfl: 1   Allergies  Allergen Reactions   Gadolinium  Derivatives Shortness Of Breath and Nausea And Vomiting   Levaquin [Levofloxacin In D5w] Itching    Was giving with vancomycin at same time - unsure which one pt had a reaction to   Sulfa Antibiotics Swelling    "Ears swelled up like [place]"   Vancomycin Itching    Was giving at same time as Levaquin - unsure which pt had reaction to   Aripiprazole Other (See Comments)    Dry mouth with sores    Levofloxacin Itching   Codeine Itching and Rash   Oxycodone Itching   Penicillins Hives    Has patient had a PCN reaction causing immediate rash, facial/tongue/throat swelling, SOB or lightheadedness with hypotension: Yes Has patient had a PCN reaction causing severe rash involving mucus membranes or skin necrosis: No Has patient had a PCN reaction that required hospitalization No Has patient had a PCN reaction occurring within the last 10 years: No If all of the above answers are "NO", then may proceed with Cephalosporin use.     Social History   Tobacco Use   Smoking status: Never   Smokeless tobacco: Never  Vaping Use   Vaping Use: Never used  Substance Use Topics   Alcohol use: Not Currently    Comment: occasional wine   Drug use: Never      Chart Review Today: I have reviewed the patient's medical history in detail and updated the computerized patient record.   Review of Systems  Constitutional: Negative.   HENT: Negative.    Eyes: Negative.   Respiratory: Negative.    Cardiovascular: Negative.   Gastrointestinal: Negative.   Endocrine: Negative.   Genitourinary: Negative.   Musculoskeletal: Negative.   Skin: Negative.   Allergic/Immunologic: Negative.   Neurological: Negative.   Hematological: Negative.   Psychiatric/Behavioral: Negative.    All other systems reviewed and are negative.      Objective:   Vitals:   02/06/23 1400  BP: 108/72  Pulse: 97  Resp: 16  Temp: 98.5 F (36.9 C)  TempSrc: Oral  SpO2: 94%  Weight: 234 lb 6.4 oz (106.3 kg)  Height:  5\' 5"  (1.651 m)    Body mass index is 39.01 kg/m.  Physical Exam Vitals and nursing note reviewed.  Constitutional:      General: She is not in acute distress.    Appearance: Normal appearance. She is well-developed. She is obese. She is not ill-appearing, toxic-appearing or diaphoretic.  HENT:     Head: Normocephalic and atraumatic.     Nose: Nose normal.  Eyes:     General:        Right eye: No discharge.        Left eye: No discharge.  Conjunctiva/sclera: Conjunctivae normal.  Neck:     Trachea: No tracheal deviation.  Cardiovascular:     Rate and Rhythm: Normal rate and regular rhythm.     Comments: Nonpitting LE edema, symmetrical Pulmonary:     Effort: Pulmonary effort is normal. No respiratory distress.     Breath sounds: No stridor.  Musculoskeletal:        General: Normal range of motion.     Lumbar back: Tenderness (paraspinal muscle ttp) present.     Right lower leg: Edema present.     Left lower leg: Edema present.  Skin:    General: Skin is warm and dry.     Findings: No rash.  Neurological:     Mental Status: She is alert.     Motor: No abnormal muscle tone.     Coordination: Coordination normal.  Psychiatric:        Behavior: Behavior normal.      Results for orders placed or performed in visit on 02/06/23  POCT urinalysis dipstick  Result Value Ref Range   Color, UA Yellow    Clarity, UA Clear    Glucose, UA Negative Negative   Bilirubin, UA Negative    Ketones, UA Negative    Spec Grav, UA 1.010 1.010 - 1.025   Blood, UA Trace    pH, UA 5.0 5.0 - 8.0   Protein, UA Negative Negative   Urobilinogen, UA 0.2 0.2 or 1.0 E.U./dL   Nitrite, UA Negative    Leukocytes, UA Large (3+) (A) Negative   Appearance Yellow    Odor N/A        Assessment & Plan:   1. Muscle spasms of both lower extremities Sx sound very frequent and severe.  Not able to induce on exam today, no visible fasciculations Will check electrolytes Pt may be able to take  magnesium supplement or hold some plain water and add some fluids with some electrolytes but we would need to monitor her BP and CHF  She rarely uses lasix - fluid status/Chf has been managed by Sherryll Burger and spironolactone for many years and she is at her dry weight.  - COMPLETE METABOLIC PANEL WITH GFR - Magnesium  2. [place] Will check labs  - COMPLETE METABOLIC PANEL WITH GFR - Magnesium  3. Bilateral low back pain, unspecified chronicity, unspecified whether sciatica present Some lumbar paraspinal muscle ttp She had some imaging not that long ago which showed arthritic changes in her back, I offered to connect her with orthopedic or spine and she declined we discussed conservative management with muscle relaxers and over-the-counter medicines.  I strongly encouraged her to consider physical therapy eval and treatment but she also declined  - POCT urinalysis dipstick Urine was unremarkable  tx for muscle spasms pending labs - we did discuss possible magnesium supplements - she has tried some in the past and it caused GI upset    Danelle Berry, PA-C 02/06/23 2:28 PM

## 2023-02-07 LAB — COMPLETE METABOLIC PANEL WITH GFR
AG Ratio: 1.8 (calc) (ref 1.0–2.5)
ALT: 19 U/L (ref 6–29)
AST: 19 U/L (ref 10–35)
Albumin: 4.3 g/dL (ref 3.6–5.1)
Alkaline phosphatase (APISO): 57 U/L (ref 37–153)
BUN: 21 mg/dL (ref 7–25)
CO2: 28 mmol/L (ref 20–32)
Calcium: 9.5 mg/dL (ref 8.6–10.4)
Chloride: 101 mmol/L (ref 98–110)
Creat: 0.97 mg/dL (ref 0.50–1.05)
Globulin: 2.4 g/dL (calc) (ref 1.9–3.7)
Glucose, Bld: 94 mg/dL (ref 65–99)
Potassium: 4.6 mmol/L (ref 3.5–5.3)
Sodium: 138 mmol/L (ref 135–146)
Total Bilirubin: 0.6 mg/dL (ref 0.2–1.2)
Total Protein: 6.7 g/dL (ref 6.1–8.1)
eGFR: 64 mL/min/{1.73_m2} (ref >=60)

## 2023-02-07 LAB — MAGNESIUM: Magnesium: 1.7 mg/dL (ref 1.5–2.5)

## 2023-03-15 NOTE — Patient Instructions (Incomplete)
Preventive Care 52 Years and Older, Female Preventive care refers to lifestyle choices and visits with your health care provider that can promote health and wellness. Preventive care visits are also called wellness exams. What can I expect for my preventive care visit? Counseling Your health care provider may ask you questions about your: Medical history, including: Past medical problems. Family medical history. Pregnancy and menstrual history. History of falls. Current health, including: Memory and ability to understand (cognition). Emotional well-being. Home life and relationship well-being. Sexual activity and sexual health. Lifestyle, including: Alcohol, nicotine or tobacco, and drug use. Access to firearms. Diet, exercise, and sleep habits. Work and work Astronomer. Sunscreen use. Safety issues such as seatbelt and bike helmet use. Physical exam Your health care provider will check your: Height and weight. These may be used to calculate your BMI (body mass index). BMI is a measurement that tells if you are at a healthy weight. Waist circumference. This measures the distance around your waistline. This measurement also tells if you are at a healthy weight and may help predict your risk of certain diseases, such as type 2 diabetes and high blood pressure. Heart rate and blood pressure. Body temperature. Skin for abnormal spots. What immunizations do I need?  Vaccines are usually given at various ages, according to a schedule. Your health care provider will recommend vaccines for you based on your age, medical history, and lifestyle or other factors, such as travel or where you work. What tests do I need? Screening Your health care provider may recommend screening tests for certain conditions. This may include: Lipid and cholesterol levels. Hepatitis C test. Hepatitis B test. HIV (human immunodeficiency virus) test. STI (sexually transmitted infection) testing, if you are at  risk. Lung cancer screening. Colorectal cancer screening. Diabetes screening. This is done by checking your blood sugar (glucose) after you have not eaten for a while (fasting). Mammogram. Talk with your health care provider about how often you should have regular mammograms. BRCA-related cancer screening. This may be done if you have a family history of breast, ovarian, tubal, or peritoneal cancers. Bone density scan. This is done to screen for osteoporosis. Talk with your health care provider about your test results, treatment options, and if necessary, the need for more tests. Follow these instructions at home: Eating and drinking  Eat a diet that includes fresh fruits and vegetables, whole grains, lean protein, and low-fat dairy products. Limit your intake of foods with high amounts of sugar, saturated fats, and salt. Take vitamin and mineral supplements as recommended by your health care provider. Do not drink alcohol if your health care provider tells you not to drink. If you drink alcohol: Limit how much you have to 0-1 drink a day. Know how much alcohol is in your drink. In the [place] one drink equals one 12 oz bottle of beer (355 mL), one 5 oz glass of wine (148 mL), or one 1 oz glass of hard liquor (44 mL). Lifestyle Brush your teeth every morning and night with fluoride toothpaste. Floss one time each day. Exercise for at least 30 minutes 5 or more days each week. Do not use any products that contain nicotine or tobacco. These products include cigarettes, chewing tobacco, and vaping devices, such as e-cigarettes. If you need help quitting, ask your health care provider. Do not use drugs. If you are sexually active, practice safe sex. Use a condom or other form of protection in order to prevent STIs. Take aspirin only as told by  your health care provider. Make sure that you understand how much to take and what form to take. Work with your health care provider to find out whether it  is safe and beneficial for you to take aspirin daily. Ask your health care provider if you need to take a cholesterol-lowering medicine (statin). Find healthy ways to manage stress, such as: Meditation, yoga, or listening to music. Journaling. Talking to a trusted person. Spending time with friends and family. Minimize exposure to UV radiation to reduce your risk of skin cancer. Safety Always wear your seat belt while driving or riding in a vehicle. Do not drive: If you have been drinking alcohol. Do not ride with someone who has been drinking. When you are tired or distracted. While texting. If you have been using any mind-altering substances or drugs. Wear a helmet and other protective equipment during sports activities. If you have firearms in your house, make sure you follow all gun safety procedures. What's next? Visit your health care provider once a year for an annual wellness visit. Ask your health care provider how often you should have your eyes and teeth checked. Stay up to date on all vaccines. This information is not intended to replace advice given to you by your health care provider. Make sure you discuss any questions you have with your health care provider. Document Revised: 02/17/2021 Document Reviewed: 02/17/2021 Elsevier Patient Education  2024 [employer].

## 2023-03-16 ENCOUNTER — Ambulatory Visit (INDEPENDENT_AMBULATORY_CARE_PROVIDER_SITE_OTHER): Payer: Medicare Other | Admitting: Family Medicine

## 2023-03-16 ENCOUNTER — Encounter: Payer: Self-pay | Admitting: Family Medicine

## 2023-03-16 VITALS — BP 104/70 | HR 98 | Temp 97.8°F | Resp 16 | Ht 64.0 in | Wt 236.3 lb

## 2023-03-16 DIAGNOSIS — L989 Disorder of the skin and subcutaneous tissue, unspecified: Secondary | ICD-10-CM | POA: Diagnosis not present

## 2023-03-16 DIAGNOSIS — Z23 Encounter for immunization: Secondary | ICD-10-CM

## 2023-03-16 DIAGNOSIS — I2699 Other pulmonary embolism without acute cor pulmonale: Secondary | ICD-10-CM

## 2023-03-16 DIAGNOSIS — G4700 Insomnia, unspecified: Secondary | ICD-10-CM | POA: Diagnosis not present

## 2023-03-16 DIAGNOSIS — G2581 Restless legs syndrome: Secondary | ICD-10-CM | POA: Diagnosis not present

## 2023-03-16 DIAGNOSIS — R195 Other fecal abnormalities: Secondary | ICD-10-CM

## 2023-03-16 DIAGNOSIS — Z0000 Encounter for general adult medical examination without abnormal findings: Secondary | ICD-10-CM | POA: Diagnosis not present

## 2023-03-16 DIAGNOSIS — Z6841 Body Mass Index (BMI) 40.0 and over, adult: Secondary | ICD-10-CM

## 2023-03-16 DIAGNOSIS — E785 Hyperlipidemia, unspecified: Secondary | ICD-10-CM | POA: Diagnosis not present

## 2023-03-16 DIAGNOSIS — F331 Major depressive disorder, recurrent, moderate: Secondary | ICD-10-CM

## 2023-03-16 DIAGNOSIS — Z1231 Encounter for screening mammogram for malignant neoplasm of breast: Secondary | ICD-10-CM

## 2023-03-16 DIAGNOSIS — J441 Chronic obstructive pulmonary disease with (acute) exacerbation: Secondary | ICD-10-CM | POA: Diagnosis not present

## 2023-03-16 DIAGNOSIS — Z1211 Encounter for screening for malignant neoplasm of colon: Secondary | ICD-10-CM

## 2023-03-16 DIAGNOSIS — D649 Anemia, unspecified: Secondary | ICD-10-CM | POA: Diagnosis not present

## 2023-03-16 DIAGNOSIS — I10 Essential (primary) hypertension: Secondary | ICD-10-CM | POA: Diagnosis not present

## 2023-03-16 MED ORDER — QUETIAPINE FUMARATE 25 MG PO TABS
25.0000 mg | ORAL_TABLET | Freq: Every evening | ORAL | 0 refills | Status: DC | PRN
Start: 2023-03-16 — End: 2023-05-30

## 2023-03-16 MED ORDER — TRAZODONE HCL 50 MG PO TABS
25.0000 mg | ORAL_TABLET | Freq: Every evening | ORAL | 0 refills | Status: DC | PRN
Start: 2023-03-16 — End: 2023-05-02

## 2023-03-16 NOTE — Progress Notes (Signed)
Patient: Patricia Fischer, Female    DOB: 04-06-1956, 67 y.o.   MRN: [id number] Danelle Berry, PA-C Visit Date: 03/16/2023  Today's Provider: Danelle Berry, PA-C   Chief Complaint  Patient presents with   Annual Exam   Subjective:   Annual physical exam:  Patricia Fischer is a 67 y.o. female who presents today for complete physical exam:  Exercise/Activity:   exercises 3x a week as able to ortho issues and pain Diet/nutrition:  tries to eat healthy Sleep: poor sleep  SDOH Screenings   Food Insecurity: No Food Insecurity (02/02/2023)  Housing: Low Risk  (02/02/2023)  Transportation Needs: No Transportation Needs (02/02/2023)  Utilities: Not At Risk (11/22/2022)  Alcohol Screen: Low Risk  (11/25/2022)  Depression (PHQ2-9): Medium Risk (03/16/2023)  Financial Resource Strain: Low Risk  (02/02/2023)  Physical Activity: Unknown (02/02/2023)  Social Connections: Unknown (02/02/2023)  Stress: Patient Declined (02/02/2023)  Recent Concern: Stress - Stress Concern Present (11/25/2022)  Tobacco Use: Low Risk  (03/16/2023)  Health Literacy: Adequate Health Literacy (03/16/2023)      USPSTF grade A and B recommendations - reviewed and addressed today  Depression:  Phq 9 completed today by patient, was reviewed by me with patient in the room PHQ score is positive Pt has refused meds in the past Hx of anxiety, worse mood after death of her husband last year Offered referral to psych     03/16/2023   10:20 AM 02/06/2023    1:59 PM 11/25/2022    3:36 PM 11/15/2022    2:19 PM  PHQ 2/9 Scores  PHQ - 2 Score 4 0 0 0  PHQ- 9 Score 10 0 0 0      03/16/2023   10:20 AM 02/06/2023    1:59 PM 11/25/2022    3:36 PM 11/15/2022    2:19 PM 11/01/2022   11:58 AM  Depression screen PHQ 2/9  Decreased Interest 2 0 0 0 0  Down, Depressed, Hopeless 2 0 0 0 0  PHQ - 2 Score 4 0 0 0 0  Altered sleeping 2 0 0 0 0  Tired, decreased energy 2 0 0 0 0  Change in appetite 0 0 0 0 0  Feeling bad or failure  about yourself  0 0 0 0 0  Trouble concentrating 2 0 0 0 0  Moving slowly or fidgety/restless 0 0 0 0 0  Suicidal thoughts 0 0 0 0 0  PHQ-9 Score 10 0 0 0 0  Difficult doing work/chores Somewhat difficult Not difficult at all Not difficult at all Not difficult at all     Alcohol screening: Flowsheet Row Clinical Support from 11/25/2022 in [facility]  AUDIT-C Score 0       Immunizations and Health Maintenance: Health Maintenance  Topic Date Due   Colonoscopy  01/16/2014   MAMMOGRAM  07/08/2022   Zoster Vaccines- Shingrix (1 of 2) 05/09/2023 (Originally 10/27/2004)   INFLUENZA VACCINE  04/06/2023   Medicare Annual Wellness (AWV)  11/25/2023   DEXA SCAN  04/14/2024   DTaP/Tdap/Td (3 - Td or Tdap) 05/17/2026   Pneumonia Vaccine 59+ Years old  Completed   Hepatitis C Screening  Completed   HPV VACCINES  Aged Out   COVID-19 Vaccine  Discontinued     Hep C Screening: done  STD testing and prevention (HIV/chl/gon/syphilis):  see above, no additional testing desired by pt today- none needed  Intimate partner violence: denies  Sexual History/Pain during Intercourse: Widowed  Menstrual History/LMP/Abnormal Bleeding: none - s/p hysterectomy No LMP recorded. Patient has had a hysterectomy.  Incontinence Symptoms: seeing urology   Breast cancer: due - ordered Last Mammogram: *see HM list above BRCA gene screening:   Cervical cancer screening: n/a   Osteoporosis:  last dexa last year normal bone density - recommend screening every 2-3 years Discussion on osteoporosis per age, including high calcium and vitamin D supplementation, weight bearing exercises Pt is supplementing with daily calcium/Vit D.   Skin cancer:  Hx of skin CA Seeing dermatology  Discussed atypical lesions   Colorectal cancer:   Colonoscopy is OVERDUE - positive cologuard - referred to GI but hasn't gone to do Discussed concerning signs and sx of CRC  Lung cancer:   Low Dose  CT Chest recommended if Age 83-80 years, 20 pack-year currently smoking OR have quit w/in 15years. Patient does not qualify.    Social History   Tobacco Use   Smoking status: Never   Smokeless tobacco: Never  Vaping Use   Vaping status: Never Used  Substance Use Topics   Alcohol use: Not Currently    Comment: occasional wine   Drug use: Never     Flowsheet Row Clinical Support from 11/25/2022 in [facility]  AUDIT-C Score 0       Family History  Problem Relation Age of Onset   Breast cancer Paternal Aunt    Breast cancer Mother 63   Migraines Mother    Cancer Mother    Hypertension Mother    Diabetes Mother    Rectal cancer Father    Cancer Father    COPD Father    Alcohol abuse Father    Heart disease Paternal Grandmother    Stroke Paternal Grandmother    Heart attack Paternal Grandmother    Aneurysm Paternal Grandfather    COPD Paternal Grandfather    Breast cancer Cousin    Breast cancer Cousin    Breast cancer Cousin    Diabetes Sister    Hearing loss Son    Anesthesia problems Neg Hx      Blood pressure/Hypertension: BP Readings from Last 3 Encounters:  03/16/23 104/70  02/06/23 108/72  01/11/23 117/81    Weight/Obesity: Wt Readings from Last 3 Encounters:  03/16/23 236 lb 4.8 oz (107.2 kg)  02/06/23 234 lb 6.4 oz (106.3 kg)  01/11/23 239 lb (108.4 kg)   BMI Readings from Last 3 Encounters:  03/16/23 40.56 kg/m  02/06/23 39.01 kg/m  01/11/23 39.77 kg/m     Lipids:  Lab Results  Component Value Date   CHOL 100 10/06/2022   CHOL 190 03/15/2022   CHOL 193 09/30/2019   Lab Results  Component Value Date   HDL 49 10/06/2022   HDL 48 (L) 03/15/2022   HDL 53 09/30/2019   Lab Results  Component Value Date   LDLCALC 37 10/06/2022   LDLCALC 120 (H) 03/15/2022   LDLCALC 113 (H) 09/30/2019   Lab Results  Component Value Date   TRIG 59 10/06/2022   TRIG 116 03/15/2022   TRIG 152 (H) 09/30/2019   Lab Results   Component Value Date   CHOLHDL 2.0 10/06/2022   CHOLHDL 4.0 03/15/2022   CHOLHDL 3.6 09/30/2019   No results found for: "LDLDIRECT" Based on the results of lipid panel his/her cardiovascular risk factor ( using [place] Cohort )  in the next 10 years is: The ASCVD Risk score (Arnett DK, et al., 2019) failed to calculate for the following  reasons:   The valid total cholesterol range is 130 to 320 mg/dL  Glucose:  Glucose  Date Value Ref Range Status  10/06/2022 111 (H) 70 - 99 mg/dL Final  45/40/9811 99 70 - 99 mg/dL Final   Glucose, Bld  Date Value Ref Range Status  02/06/2023 94 65 - 99 mg/dL Final    Comment:    .            Fasting reference interval .   03/15/2022 104 (H) 65 - 99 mg/dL Final    Comment:    .            Fasting reference interval . For someone without known diabetes, a glucose value between 100 and 125 mg/dL is consistent with prediabetes and should be confirmed with a follow-up test. .   09/07/2021 90 70 - 99 mg/dL Final    Comment:    Glucose reference range applies only to samples taken after fasting for at least 8 hours.    Advanced Care Planning:  A voluntary discussion about advance care planning including the explanation and discussion of advance directives.   Discussed health care proxy and Living will, and the patient was able to identify a health care proxy as sone Camelia Phenes Patient does not have a living will at present time.   Social History       Social History   Socioeconomic History   Marital status: Widowed    Spouse name: Not on file   Number of children: 2   Years of education: 22   Highest education level: Associate degree: academic program  Occupational History   Occupation: retired  Tobacco Use   Smoking status: Never   Smokeless tobacco: Never  Vaping Use   Vaping status: Never Used  Substance and Sexual Activity   Alcohol use: Not Currently    Comment: occasional wine   Drug use: Never   Sexual activity:  Not Currently    Birth control/protection: None  Other Topics Concern   Not on file  Social History Narrative   Not on file   Social Determinants of Health   Financial Resource Strain: Low Risk  (02/02/2023)   Overall Financial Resource Strain (CARDIA)    Difficulty of Paying Living Expenses: Not hard at all  Food Insecurity: No Food Insecurity (02/02/2023)   Hunger Vital Sign    Worried About Running Out of Food in the Last Year: Never true    Ran Out of Food in the Last Year: Never true  Transportation Needs: No Transportation Needs (02/02/2023)   PRAPARE - Administrator, Civil Service (Medical): No    Lack of Transportation (Non-Medical): No  Physical Activity: Unknown (02/02/2023)   Exercise Vital Sign    Days of Exercise per Week: 2 days    Minutes of Exercise per Session: Patient declined  Stress: Patient Declined (02/02/2023)   [employer] of Occupational Health - Occupational Stress Questionnaire    Feeling of Stress : Patient declined  Recent Concern: Stress - Stress Concern Present (11/25/2022)   [employer] of Occupational Health - Occupational Stress Questionnaire    Feeling of Stress : Rather much  Social Connections: Unknown (02/02/2023)   Social Connection and Isolation Panel [NHANES]    Frequency of Communication with Friends and Family: More than three times a week    Frequency of Social Gatherings with Friends and Family: More than three times a week    Attends Religious Services: Patient declined  Active Member of Clubs or Organizations: Yes    Attends Banker Meetings: More than 4 times per year    Marital Status: Widowed    Family History        Family History  Problem Relation Age of Onset   Breast cancer Paternal Aunt    Breast cancer Mother 56   Migraines Mother    Cancer Mother    Hypertension Mother    Diabetes Mother    Rectal cancer Father    Cancer Father    COPD Father    Alcohol abuse Father    Heart  disease Paternal Grandmother    Stroke Paternal Grandmother    Heart attack Paternal Grandmother    Aneurysm Paternal Grandfather    COPD Paternal Grandfather    Breast cancer Cousin    Breast cancer Cousin    Breast cancer Cousin    Diabetes Sister    Hearing loss Son    Anesthesia problems Neg Hx     Patient Active Problem List   Diagnosis Date Noted   Positive colorectal cancer screening using Cologuard test 03/16/2023   Pain of right shoulder joint on movement 08/04/2022   Osteoarthritis of knee 03/17/2022   Claustrophobia 02/09/2022   Pain in joint of left knee 12/21/2021   Derangement of left knee 12/21/2021   Chronic obstructive pulmonary disease (HCC) 01/22/2020   Stress incontinence 12/27/2019   Hyperlipidemia 12/27/2019   Muscle spasms of lower extremity 12/27/2019   Class 3 severe obesity with body mass index (BMI) of 40.0 to 44.9 in adult (HCC) 07/12/2017   Allergic rhinitis 06/26/2017   Hematuria 07/27/2016   Degenerative joint disease (DJD) of hip 03/31/2016   Hx of breast cancer 03/22/2016   History of pulmonary embolism 10/27/2015   HTN (hypertension) 10/07/2015   Depression, major, recurrent, moderate (HCC) 06/11/2015   Anemia 05/15/2015   Do not resuscitate status 05/15/2015   HFrEF (heart failure with reduced ejection fraction) (HCC) 05/05/2015   Left leg DVT (HCC) 05/05/2015   Pulmonary hypertension (HCC)    Saddle pulmonary embolus (HCC)    LBBB (left bundle branch block)-rate related  04/19/2015   Chronic migraine without aura, with intractable migraine, so stated, with status migrainosus 11/04/2014   Subscapularis (muscle) sprain 11/22/2013    Past Surgical History:  Procedure Laterality Date   ABDOMINAL HYSTERECTOMY  1986   ANAL FISSURE REPAIR     ANTERIOR CERVICAL DECOMP/DISCECTOMY FUSION  01/23/2012   Procedure: ANTERIOR CERVICAL DECOMPRESSION/DISCECTOMY FUSION 1 LEVEL;  Surgeon: Mariam Dollar, MD;  Location: [facility]UR[facility] OR[facility];  Service:  Neurosurgery;  Laterality: N/A;  Cervical five-six anterior cervical discectomy fusion with plating   APPENDECTOMY     BREAST BIOPSY Left 06/2016   US biopsy-PAPILLARY CYSTIC APOCRINE CHANGE    BREAST SURGERY Left 2011   Breast lumpectomy   CARDIAC CATHETERIZATION  2010   [facility]; pt states it was "clear"   CARDIAC CATHETERIZATION N/A 10/28/2015   Procedure: Right/Left Heart Cath and Coronary Angiography;  Surgeon: Lyn Records, MD;  Location: [facility] INVASIVE CV LAB;  Service: Cardiovascular;  Laterality: N/A;   CARPAL TUNNEL RELEASE Bilateral    CESAREAN SECTION  1982, 1979   CHOLECYSTECTOMY     COLONOSCOPY  01/16/2009   Dr Lemar Livings   KNEE CARTILAGE SURGERY Left    SHOULDER ARTHROSCOPY WITH SUBACROMIAL DECOMPRESSION, ROTATOR CUFF REPAIR AND BICEP TENDON REPAIR Right 11/22/2013   Procedure: RIGHT SHOULDER ATHROSCOPY OPEN SUBSCAP REPAIR DELTA-PECTORAL ;  Surgeon: Viviann Spare  Russ Halo, MD;  Location: [facility] OR;  Service: Orthopedics;  Laterality: Right;   TARSAL TUNNEL RELEASE Left 1990   TONSILLECTOMY     TUBAL LIGATION       Current Outpatient Medications:    albuterol (VENTOLIN HFA) 108 (90 Base) MCG/ACT inhaler, TAKE 2 PUFFS BY MOUTH EVERY 6 HOURS AS NEEDED FOR WHEEZE OR SHORTNESS OF BREATH, Disp: 8.5 each, Rfl: 2   carvedilol (COREG) 6.25 MG tablet, Take 1 tablet (6.25 mg total) by mouth 2 (two) times daily., Disp: 180 tablet, Rfl: 3   cetirizine (ZYRTEC) 10 MG tablet, Take 10 mg by mouth daily., Disp: , Rfl:    fluticasone (FLONASE) 50 MCG/ACT nasal spray, SPRAY 2 SPRAYS INTO EACH NOSTRIL EVERY DAY, Disp: 48 mL, Rfl: 1   furosemide (LASIX) 40 MG tablet, Take 1 tablet (40 mg total) by mouth daily. (Patient taking differently: Take 40 mg by mouth daily. PRN), Disp: 10 tablet, Rfl: 0   rosuvastatin (CRESTOR) 20 MG tablet, Take 1 tablet (20 mg total) by mouth daily., Disp: 90 tablet, Rfl: 3   sacubitril-valsartan (ENTRESTO) 24-26 MG, Take 1 tablet by mouth 2 (two) times daily., Disp: 180  tablet, Rfl: 3   spironolactone (ALDACTONE) 25 MG tablet, TAKE 1 TABLET (25 MG TOTAL) BY MOUTH DAILY., Disp: 90 tablet, Rfl: 3   tizanidine (ZANAFLEX) 2 MG capsule, Take 2 mg by mouth 3 (three) times daily as needed for muscle spasms., Disp: , Rfl:    traMADol (ULTRAM) 50 MG tablet, Take 50 mg by mouth every 4 (four) hours as needed., Disp: , Rfl:    TRELEGY ELLIPTA 100-62.5-25 MCG/ACT AEPB, INHALE 1 PUFF BY MOUTH EVERY DAY, Disp: 60 each, Rfl: 1   vitamin B-12 (CYANOCOBALAMIN) 500 MCG tablet, Take 2,500 mcg by mouth daily., Disp: , Rfl:    vitamin C (ASCORBIC ACID) 500 MG tablet, Take 500 mg by mouth daily., Disp: , Rfl:    XARELTO 10 MG TABS tablet, TAKE 1 TABLET BY MOUTH EVERY DAY, Disp: 90 tablet, Rfl: 1  Allergies  Allergen Reactions   Gadolinium Derivatives Shortness Of Breath and Nausea And Vomiting   Levaquin [Levofloxacin In D5w] Itching    Was giving with vancomycin at same time - unsure which one pt had a reaction to   Sulfa Antibiotics Swelling    "Ears swelled up like Dumbo"   Vancomycin Itching    Was giving at same time as Levaquin - unsure which pt had reaction to   Aripiprazole Other (See Comments)    Dry mouth with sores    Levofloxacin Itching   Codeine Itching and Rash   Oxycodone Itching   Penicillins Hives    Has patient had a PCN reaction causing immediate rash, facial/tongue/throat swelling, SOB or lightheadedness with hypotension: Yes Has patient had a PCN reaction causing severe rash involving mucus membranes or skin necrosis: No Has patient had a PCN reaction that required hospitalization No Has patient had a PCN reaction occurring within the last 10 years: No If all of the above answers are "NO", then may proceed with Cephalosporin use.    Patient Care Team: Danelle Berry, PA-C as PCP - General (Family Medicine) Jake Bathe, MD as PCP - Cardiology (Cardiology) Lyn Records, MD (Inactive) as Consulting Physician (Cardiology) Bensimhon, Bevelyn Buckles, MD  as Consulting Physician (Cardiology)   Chart Review: I personally reviewed active problem list, medication list, allergies, family history, social history, health maintenance, notes from last encounter, lab results, imaging with the patient/caregiver  today.   Review of Systems  Constitutional: Negative.   HENT: Negative.    Eyes: Negative.   Respiratory: Negative.    Cardiovascular: Negative.   Gastrointestinal: Negative.   Endocrine: Negative.   Genitourinary: Negative.   Musculoskeletal: Negative.   Skin: Negative.   Allergic/Immunologic: Negative.   Neurological: Negative.   Hematological: Negative.   Psychiatric/Behavioral: Negative.    All other systems reviewed and are negative.         Objective:   Vitals:  Vitals:   03/16/23 1023  BP: 104/70  Pulse: 98  Resp: 16  Temp: 97.8 F (36.6 C)  TempSrc: Oral  SpO2: 96%  Weight: 236 lb 4.8 oz (107.2 kg)  Height: 5\' 4"  (1.626 m)    Body mass index is 40.56 kg/m.  Physical Exam Vitals and nursing note reviewed.  Constitutional:      General: She is not in acute distress.    Appearance: Normal appearance. She is well-developed and well-groomed. She is obese. She is not ill-appearing, toxic-appearing or diaphoretic.  HENT:     Head: Normocephalic and atraumatic.     Right Ear: Tympanic membrane, ear canal and external ear normal. There is no impacted cerumen.     Left Ear: Tympanic membrane, ear canal and external ear normal. There is no impacted cerumen.     Nose: Nose normal. No congestion or rhinorrhea.     Mouth/Throat:     Mouth: Mucous membranes are moist.     Pharynx: Oropharynx is clear. Uvula midline. No oropharyngeal exudate or posterior oropharyngeal erythema.  Eyes:     General: Lids are normal. No scleral icterus.       Right eye: No discharge.        Left eye: No discharge.     Conjunctiva/sclera: Conjunctivae normal.  Neck:     Trachea: Phonation normal. No tracheal deviation.   Cardiovascular:     Rate and Rhythm: Normal rate and regular rhythm.     Pulses: Normal pulses.          Radial pulses are 2+ on the right side and 2+ on the left side.       Posterior tibial pulses are 2+ on the right side and 2+ on the left side.     Heart sounds: Normal heart sounds. No murmur heard.    No friction rub. No gallop.  Pulmonary:     Effort: Pulmonary effort is normal. No respiratory distress.     Breath sounds: Normal breath sounds. No stridor. No wheezing, rhonchi or rales.  Chest:     Chest wall: No tenderness.  Abdominal:     General: Bowel sounds are normal. There is no distension.     Palpations: Abdomen is soft.     Tenderness: There is no abdominal tenderness. There is no guarding or rebound.  Musculoskeletal:        General: No deformity.     Cervical back: Normal range of motion and neck supple.     Right lower leg: No edema.     Left lower leg: No edema.  Lymphadenopathy:     Cervical: No cervical adenopathy.  Skin:    General: Skin is warm and dry.     Capillary Refill: Capillary refill takes less than 2 seconds.     Coloration: Skin is not pale.     Findings: No lesion or rash.  Neurological:     Mental Status: She is alert. Mental status is at baseline.  Motor: No abnormal muscle tone.     Gait: Gait normal.  Psychiatric:        Attention and Perception: Attention normal.        Mood and Affect: Mood is depressed. Mood is not anxious. Affect is tearful.        Speech: Speech normal.        Behavior: Behavior is cooperative.        Thought Content: Thought content normal. Thought content does not include homicidal or suicidal ideation. Thought content does not include homicidal or suicidal plan.       Fall Risk:    03/16/2023   10:20 AM 02/06/2023    1:59 PM 11/22/2022   12:28 PM 11/15/2022    2:19 PM 11/01/2022   11:57 AM  Fall Risk   Falls in the past year? 0 1 0 0 0  Number falls in past yr: 0 1  0   Injury with Fall? 0 0  0   Risk  for fall due to : No Fall Risks Impaired balance/gait No Fall Risks No Fall Risks No Fall Risks  Follow up Falls prevention discussed;Education provided;Falls evaluation completed Falls prevention discussed;Education provided;Falls evaluation completed Falls prevention discussed;Education provided Falls prevention discussed;Education provided;Falls evaluation completed Falls prevention discussed    Functional Status Survey: Is the patient deaf or have difficulty hearing?: No Does the patient have difficulty seeing, even when wearing glasses/contacts?: No Does the patient have difficulty concentrating, remembering, or making decisions?: No Does the patient have difficulty walking or climbing stairs?: Yes Does the patient have difficulty dressing or bathing?: No Does the patient have difficulty doing errands alone such as visiting a doctor's office or shopping?: No   Assessment & Plan:    CPE completed today  USPSTF grade A and B recommendations reviewed with patient; age-appropriate recommendations, preventive care, screening tests, etc discussed and encouraged; healthy living encouraged; see AVS for patient education given to patient  Discussed importance of 150 minutes of physical activity weekly, AHA exercise recommendations given to pt in AVS/handout  Discussed importance of healthy diet:  eating lean meats and proteins, avoiding trans fats and saturated fats, avoid simple sugars and excessive carbs in diet, eat 6 servings of fruit/vegetables daily and drink plenty of water and avoid sweet beverages.    Recommended pt to do annual eye exam and routine dental exams/cleanings  Depression, alcohol, fall screening completed as documented above and per flowsheets  Advance Care planning information and packet discussed and offered today, encouraged pt to discuss with family members/spouse/partner/friends and complete Advanced directive packet and bring copy to office   Reviewed Health  Maintenance: Health Maintenance  Topic Date Due   Colonoscopy  01/16/2014   MAMMOGRAM  07/08/2022   Zoster Vaccines- Shingrix (1 of 2) 05/09/2023 (Originally 10/27/2004)   INFLUENZA VACCINE  04/06/2023   Medicare Annual Wellness (AWV)  11/25/2023   DEXA SCAN  04/14/2024   DTaP/Tdap/Td (3 - Td or Tdap) 05/17/2026   Pneumonia Vaccine 89+ Years old  Completed   Hepatitis C Screening  Completed   HPV VACCINES  Aged Out   COVID-19 Vaccine  Discontinued    Immunizations: Immunization History  Administered Date(s) Administered   Influenza Inj Mdck Quad Pf 06/21/2019   Influenza,inj,Quad PF,6+ Mos 07/01/2015, 05/17/2016, 06/26/2017, 08/08/2018   PNEUMOCOCCAL CONJUGATE-20 02/16/2021   Pneumococcal Polysaccharide-23 07/13/2007   Td 09/05/2004   Tdap 05/17/2016   Vaccines:  Shingrix: due/recommended - 44-64 yo and ask insurance if covered when patient  above 9 yo Pneumonia: completed Flu: recommended annually - currently out of season educated and discussed with patient.     ICD-10-CM   1. Annual physical exam  Z00.00 CBC with Differential/Platelet    COMPLETE METABOLIC PANEL WITH GFR   completed today    2. Encounter for screening mammogram for malignant neoplasm of breast  Z12.31 MM 3D SCREENING MAMMOGRAM BILATERAL BREAST    3. Need for shingles vaccine  Z23     4. Recurrent pulmonary emboli (HCC) Chronic I26.99    per specialists, no current sx or concerns    5. Chronic obstructive pulmonary disease with acute exacerbation (HCC) Chronic J44.1    sx currently controlled with inhalers    6. Depression, major, recurrent, moderate (HCC) Chronic F33.1    she stopped meds, offered referrals, phq 9 positive    7. Positive colorectal cancer screening using Cologuard test  R19.5 Ambulatory referral to Gastroenterology   referred to GI for colonoscopy and f/up on + cologuard from 2023, not done yet    8. Class 3 severe obesity due to excess calories with serious comorbidity and body  mass index (BMI) of 40.0 to 44.9 in adult [facility])  E66.01    Z68.41    with multiple associated comorbitidies, HTN, HLD, CHF, OA    9. Screening for malignant neoplasm of colon  Z12.11 Ambulatory referral to Gastroenterology    10. Insomnia, unspecified type  G47.00 traZODone (DESYREL) 50 MG tablet    QUEtiapine (SEROQUEL) 25 MG tablet    11. RLS (restless legs syndrome)  G25.81    pt asked to return to discuss sx or work up sx/CC    12. Skin lesion of lower extremity  L98.9 Ambulatory referral to Dermatology   she needs to est with different dermatology - was seeing derm monitoring and biopsying dry raised lesions scattered to extermities, LE lesion noncancerous          Danelle Berry, PA-C 03/16/23 10:29 AM  [facility]

## 2023-03-17 LAB — COMPLETE METABOLIC PANEL WITHOUT GFR
AG Ratio: 1.6 (calc) (ref 1.0–2.5)
ALT: 13 U/L (ref 6–29)
AST: 17 U/L (ref 10–35)
Albumin: 3.9 g/dL (ref 3.6–5.1)
Alkaline phosphatase (APISO): 68 U/L (ref 37–153)
BUN: 11 mg/dL (ref 7–25)
CO2: 31 mmol/L (ref 20–32)
Calcium: 9.4 mg/dL (ref 8.6–10.4)
Chloride: 103 mmol/L (ref 98–110)
Creat: 0.86 mg/dL (ref 0.50–1.05)
Globulin: 2.4 g/dL (ref 1.9–3.7)
Glucose, Bld: 107 mg/dL — ABNORMAL HIGH (ref 65–99)
Potassium: 4.2 mmol/L (ref 3.5–5.3)
Sodium: 141 mmol/L (ref 135–146)
Total Bilirubin: 0.7 mg/dL (ref 0.2–1.2)
Total Protein: 6.3 g/dL (ref 6.1–8.1)
eGFR: 74 mL/min/1.73m2

## 2023-03-17 LAB — CBC WITH DIFFERENTIAL/PLATELET
Absolute Monocytes: 559 {cells}/uL (ref 200–950)
Basophils Absolute: 21 {cells}/uL (ref 0–200)
Basophils Relative: 0.3 %
Eosinophils Absolute: 152 {cells}/uL (ref 15–500)
Eosinophils Relative: 2.2 %
HCT: 39.8 % (ref 35.0–45.0)
Hemoglobin: 13 g/dL (ref 11.7–15.5)
Lymphs Abs: 1608 {cells}/uL (ref 850–3900)
MCH: 29.1 pg (ref 27.0–33.0)
MCHC: 32.7 g/dL (ref 32.0–36.0)
MCV: 89 fL (ref 80.0–100.0)
MPV: 11.5 fL (ref 7.5–12.5)
Monocytes Relative: 8.1 %
Neutro Abs: 4561 {cells}/uL (ref 1500–7800)
Neutrophils Relative %: 66.1 %
Platelets: 163 Thousand/uL (ref 140–400)
RBC: 4.47 Million/uL (ref 3.80–5.10)
RDW: 13.5 % (ref 11.0–15.0)
Total Lymphocyte: 23.3 %
WBC: 6.9 Thousand/uL (ref 3.8–10.8)

## 2023-03-22 ENCOUNTER — Encounter: Payer: Self-pay | Admitting: *Deleted

## 2023-03-23 ENCOUNTER — Other Ambulatory Visit: Payer: Self-pay

## 2023-03-23 DIAGNOSIS — I82592 Chronic embolism and thrombosis of other specified deep vein of left lower extremity: Secondary | ICD-10-CM

## 2023-03-23 MED ORDER — RIVAROXABAN 10 MG PO TABS
10.0000 mg | ORAL_TABLET | Freq: Every day | ORAL | 1 refills | Status: DC
Start: 2023-03-23 — End: 2023-06-01

## 2023-03-23 MED ORDER — ROSUVASTATIN CALCIUM 20 MG PO TABS: 20.0000 mg | ORAL_TABLET | Freq: Every day | ORAL | 2 refills | Status: DC

## 2023-03-23 NOTE — Telephone Encounter (Signed)
Xarelto 10mg  refill request received. Pt is 67 years old, weight-107.2kg, Crea-0.86 on 03/16/23, last seen by Dr. Anne Fu on 11/07/22, Diagnosis-DVT & PE (lifelong therapy), CrCl-124.65 mL/min; Dose is appropriate based on dosing criteria. Will send in refill to requested pharmacy.

## 2023-03-24 ENCOUNTER — Telehealth: Payer: Self-pay | Admitting: Family Medicine

## 2023-03-24 NOTE — Telephone Encounter (Unsigned)
Copied from CRM [id number]. Topic: Appointment Scheduling - Scheduling Inquiry for Clinic >> Mar 24, 2023 12:54 PM Marlow Baars wrote: Reason for CRM: The patient called in stating she talked to the place where her provider is sending her to have a mammogram but has new symptoms since she saw her provider 8 days ago. She said she has been having pain on the side of her left breast and because of this they are wanting her to get an order for diagnostic as well as ultrasound. She is needing a new order for this from her provider to add this to the mammogram. Please assist patient further.

## 2023-03-29 NOTE — Telephone Encounter (Signed)
Please schedule

## 2023-03-29 NOTE — Telephone Encounter (Signed)
Pt declined appt at this time. Stated that she does not have time for a visit and will call back if need.

## 2023-04-03 DIAGNOSIS — H1045 Other chronic allergic conjunctivitis: Secondary | ICD-10-CM | POA: Diagnosis not present

## 2023-04-03 DIAGNOSIS — H353122 Nonexudative age-related macular degeneration, left eye, intermediate dry stage: Secondary | ICD-10-CM | POA: Diagnosis not present

## 2023-04-03 DIAGNOSIS — H353111 Nonexudative age-related macular degeneration, right eye, early dry stage: Secondary | ICD-10-CM | POA: Diagnosis not present

## 2023-04-03 DIAGNOSIS — H04123 Dry eye syndrome of bilateral lacrimal glands: Secondary | ICD-10-CM | POA: Diagnosis not present

## 2023-04-03 DIAGNOSIS — H2513 Age-related nuclear cataract, bilateral: Secondary | ICD-10-CM | POA: Diagnosis not present

## 2023-04-13 ENCOUNTER — Ambulatory Visit: Payer: Medicare Other | Admitting: Urology

## 2023-04-18 ENCOUNTER — Other Ambulatory Visit: Payer: Self-pay

## 2023-04-18 MED ORDER — SACUBITRIL-VALSARTAN 24-26 MG PO TABS: 1.0000 | ORAL_TABLET | Freq: Two times a day (BID) | ORAL | 2 refills | Status: DC

## 2023-04-18 NOTE — Telephone Encounter (Signed)
Pt's medication was sent to pt's pharmacy as requested. Confirmation received.  °

## 2023-04-24 DIAGNOSIS — M170 Bilateral primary osteoarthritis of knee: Secondary | ICD-10-CM | POA: Diagnosis not present

## 2023-04-30 ENCOUNTER — Other Ambulatory Visit: Payer: Self-pay | Admitting: Family Medicine

## 2023-04-30 DIAGNOSIS — G4700 Insomnia, unspecified: Secondary | ICD-10-CM

## 2023-05-09 ENCOUNTER — Ambulatory Visit: Payer: Medicare Other | Admitting: Physician Assistant

## 2023-05-22 DIAGNOSIS — H1031 Unspecified acute conjunctivitis, right eye: Secondary | ICD-10-CM | POA: Diagnosis not present

## 2023-05-22 DIAGNOSIS — C44722 Squamous cell carcinoma of skin of right lower limb, including hip: Secondary | ICD-10-CM | POA: Diagnosis not present

## 2023-05-22 DIAGNOSIS — D485 Neoplasm of uncertain behavior of skin: Secondary | ICD-10-CM | POA: Diagnosis not present

## 2023-05-22 DIAGNOSIS — L538 Other specified erythematous conditions: Secondary | ICD-10-CM | POA: Diagnosis not present

## 2023-05-22 DIAGNOSIS — D0471 Carcinoma in situ of skin of right lower limb, including hip: Secondary | ICD-10-CM | POA: Diagnosis not present

## 2023-05-22 DIAGNOSIS — L820 Inflamed seborrheic keratosis: Secondary | ICD-10-CM | POA: Diagnosis not present

## 2023-05-25 ENCOUNTER — Other Ambulatory Visit: Payer: Self-pay | Admitting: Family Medicine

## 2023-05-25 DIAGNOSIS — G4700 Insomnia, unspecified: Secondary | ICD-10-CM

## 2023-06-01 ENCOUNTER — Ambulatory Visit: Payer: Medicare Other | Attending: Physician Assistant | Admitting: Cardiology

## 2023-06-01 ENCOUNTER — Encounter: Payer: Self-pay | Admitting: Cardiology

## 2023-06-01 VITALS — BP 116/70 | HR 91 | Ht 64.0 in | Wt 235.2 lb

## 2023-06-01 DIAGNOSIS — I447 Left bundle-branch block, unspecified: Secondary | ICD-10-CM | POA: Diagnosis not present

## 2023-06-01 DIAGNOSIS — R079 Chest pain, unspecified: Secondary | ICD-10-CM

## 2023-06-01 DIAGNOSIS — I82592 Chronic embolism and thrombosis of other specified deep vein of left lower extremity: Secondary | ICD-10-CM | POA: Diagnosis not present

## 2023-06-01 DIAGNOSIS — I5022 Chronic systolic (congestive) heart failure: Secondary | ICD-10-CM | POA: Diagnosis not present

## 2023-06-01 DIAGNOSIS — R600 Localized edema: Secondary | ICD-10-CM | POA: Diagnosis not present

## 2023-06-01 DIAGNOSIS — I5020 Unspecified systolic (congestive) heart failure: Secondary | ICD-10-CM

## 2023-06-01 MED ORDER — FUROSEMIDE 40 MG PO TABS
40.0000 mg | ORAL_TABLET | Freq: Every day | ORAL | 3 refills | Status: DC
Start: 2023-06-01 — End: 2024-01-23

## 2023-06-01 MED ORDER — ROSUVASTATIN CALCIUM 20 MG PO TABS: 20.0000 mg | ORAL_TABLET | Freq: Every day | ORAL | 3 refills | Status: DC

## 2023-06-01 MED ORDER — SPIRONOLACTONE 25 MG PO TABS: 25.0000 mg | ORAL_TABLET | Freq: Every day | ORAL | 3 refills | Status: DC

## 2023-06-01 MED ORDER — CARVEDILOL 6.25 MG PO TABS: 6.2500 mg | ORAL_TABLET | Freq: Two times a day (BID) | ORAL | 3 refills | Status: DC

## 2023-06-01 MED ORDER — SACUBITRIL-VALSARTAN 24-26 MG PO TABS: 1.0000 | ORAL_TABLET | Freq: Two times a day (BID) | ORAL | 3 refills | Status: DC

## 2023-06-01 MED ORDER — RIVAROXABAN 10 MG PO TABS
10.0000 mg | ORAL_TABLET | Freq: Every day | ORAL | 3 refills | Status: DC
Start: 2023-06-01 — End: 2024-07-19

## 2023-06-01 NOTE — Patient Instructions (Signed)
Medication Instructions:  Your physician recommends that you continue on your current medications as directed. Please refer to the Current Medication list given to you today.  *If you need a refill on your cardiac medications before your next appointment, please call your pharmacy*   Lab Work: None.  If you have labs (blood work) drawn today and your tests are completely normal, you will receive your results only by: MyChart Message (if you have MyChart) OR A paper copy in the mail If you have any lab test that is abnormal or we need to change your treatment, we will call you to review the results.   Testing/Procedures: How to Prepare for Your Cardiac PET/CT Stress Test:  1. Please do not take these medications before your test:   Medications that may interfere with the cardiac pharmacological stress agent (ex. nitrates - including erectile dysfunction medications, isosorbide mononitrate, tamulosin or beta-blockers) the day of the exam. (Erectile dysfunction medication should be held for at least 72 hrs prior to test) Theophylline containing medications for 12 hours. Dipyridamole 48 hours prior to the test. Your remaining medications may be taken with water.  2. Nothing to eat or drink, except water, 3 hours prior to arrival time.   NO caffeine/decaffeinated products, or chocolate 12 hours prior to arrival.  3. NO perfume, cologne or lotion on chest or abdomen area.          - FEMALES - Please avoid wearing dresses to this appointment.  4. Total time is 1 to 2 hours; you may want to bring reading material for the waiting time.  5. Please report to Radiology at the [facility] Main Entrance 30 minutes early for your test.  [address] [place], [place] [address]  6. Please report to Radiology at [facility] Main Entrance, medical mall, 30 mins prior to your test.  [address]  [place], [place]  [phone]   IF YOU THINK YOU MAY  BE PREGNANT, OR ARE NURSING PLEASE INFORM THE TECHNOLOGIST.  In preparation for your appointment, medication and supplies will be purchased.  Appointment availability is limited, so if you need to cancel or reschedule, please call the Radiology Department at [phone] [facility]) OR [phone] [facility])  24 hours in advance to avoid a cancellation fee of $100.00  What to Expect After you Arrive:  Once you arrive and check in for your appointment, you will be taken to a preparation room within the Radiology Department.  A technologist or Nurse will obtain your medical history, verify that you are correctly prepped for the exam, and explain the procedure.  Afterwards,  an IV will be started in your arm and electrodes will be placed on your skin for EKG monitoring during the stress portion of the exam. Then you will be escorted to the PET/CT scanner.  There, staff will get you positioned on the scanner and obtain a blood pressure and EKG.  During the exam, you will continue to be connected to the EKG and blood pressure machines.  A small, safe amount of a radioactive tracer will be injected in your IV to obtain a series of pictures of your heart along with an injection of a stress agent.    After your Exam:  It is recommended that you eat a meal and drink a caffeinated beverage to counter act any effects of the stress agent.  Drink plenty of fluids for the remainder of the day and urinate frequently for the first  couple of hours after the exam.  Your doctor will inform you of your test results within 7-10 business days.  For more information and frequently asked questions, please visit our website : [email or web address]  For questions about your test or how to prepare for your test, please call: Cardiac Imaging Nurse Navigators Office: [phone]    Follow-Up: At [facility], you and your health needs are our priority.  As part of our continuing mission to provide you  with exceptional heart care, we have created designated Provider Care Teams.  These Care Teams include your primary Cardiologist (physician) and Advanced Practice Providers (APPs -  Physician Assistants and Nurse Practitioners) who all work together to provide you with the care you need, when you need it.  We recommend signing up for the patient portal called "MyChart".  Sign up information is provided on this After Visit Summary.  MyChart is used to connect with patients for Virtual Visits (Telemedicine).  Patients are able to view lab/test results, encounter notes, upcoming appointments, etc.  Non-urgent messages can be sent to your provider as well.   To learn more about what you can do with MyChart, go to [email or web address].    Your next appointment:   6 month(s)  Provider:   Tereso Newcomer, PA-C     Then, Donato Schultz, MD will plan to see you again in 1 year(s).

## 2023-06-12 DIAGNOSIS — C44722 Squamous cell carcinoma of skin of right lower limb, including hip: Secondary | ICD-10-CM | POA: Diagnosis not present

## 2023-06-16 ENCOUNTER — Ambulatory Visit: Payer: Medicare Other | Admitting: Family Medicine

## 2023-06-20 ENCOUNTER — Telehealth (HOSPITAL_COMMUNITY): Payer: Self-pay | Admitting: Emergency Medicine

## 2023-06-20 ENCOUNTER — Encounter (HOSPITAL_COMMUNITY): Payer: Self-pay

## 2023-06-20 LAB — MICROALBUMIN / CREATININE URINE RATIO: Microalb Creat Ratio: HIGH

## 2023-06-20 NOTE — Telephone Encounter (Signed)
Reaching out to patient to offer assistance regarding upcoming cardiac imaging study; pt verbalizes understanding of appt date/time, parking situation and where to check in, pre-test NPO status and medications ordered, and verified current allergies; name and call back number provided for further questions should they arise Cayne Yom RN Navigator Cardiac Imaging [facility] [phone] office [phone] cell 

## 2023-06-21 ENCOUNTER — Ambulatory Visit: Payer: Medicare Other | Admitting: Family Medicine

## 2023-06-22 ENCOUNTER — Ambulatory Visit
Admission: RE | Admit: 2023-06-22 | Discharge: 2023-06-22 | Disposition: A | Discharge status: Home / Self Care | Payer: Medicare Other | Source: Ambulatory Visit | Attending: Cardiology | Admitting: Cardiology

## 2023-06-22 DIAGNOSIS — R079 Chest pain, unspecified: Secondary | ICD-10-CM

## 2023-06-23 ENCOUNTER — Encounter (HOSPITAL_COMMUNITY): Payer: Self-pay

## 2023-06-26 ENCOUNTER — Encounter: Payer: Self-pay | Admitting: Physician Assistant

## 2023-06-26 ENCOUNTER — Other Ambulatory Visit: Payer: Self-pay | Admitting: Physician Assistant

## 2023-06-26 ENCOUNTER — Ambulatory Visit (INDEPENDENT_AMBULATORY_CARE_PROVIDER_SITE_OTHER): Payer: Medicare Other | Admitting: Physician Assistant

## 2023-06-26 ENCOUNTER — Encounter: Payer: Self-pay | Admitting: Family Medicine

## 2023-06-26 VITALS — BP 106/70 | HR 89 | Temp 98.2°F | Resp 16 | Ht 64.0 in | Wt 235.3 lb

## 2023-06-26 DIAGNOSIS — E66813 Obesity, class 3: Secondary | ICD-10-CM

## 2023-06-26 DIAGNOSIS — Z23 Encounter for immunization: Secondary | ICD-10-CM

## 2023-06-26 DIAGNOSIS — Z6841 Body Mass Index (BMI) 40.0 and over, adult: Secondary | ICD-10-CM

## 2023-06-26 DIAGNOSIS — D485 Neoplasm of uncertain behavior of skin: Secondary | ICD-10-CM | POA: Diagnosis not present

## 2023-06-26 DIAGNOSIS — R809 Proteinuria, unspecified: Secondary | ICD-10-CM

## 2023-06-26 DIAGNOSIS — D0472 Carcinoma in situ of skin of left lower limb, including hip: Secondary | ICD-10-CM | POA: Diagnosis not present

## 2023-06-26 DIAGNOSIS — G4700 Insomnia, unspecified: Secondary | ICD-10-CM | POA: Diagnosis not present

## 2023-06-26 DIAGNOSIS — D0471 Carcinoma in situ of skin of right lower limb, including hip: Secondary | ICD-10-CM | POA: Diagnosis not present

## 2023-06-26 DIAGNOSIS — Z1231 Encounter for screening mammogram for malignant neoplasm of breast: Secondary | ICD-10-CM

## 2023-06-26 MED ORDER — SEMAGLUTIDE-WEIGHT MANAGEMENT 0.25 MG/0.5ML ~~LOC~~ SOAJ
0.2500 mg | SUBCUTANEOUS | 0 refills | Status: AC
Start: 2023-06-26 — End: 2023-07-24

## 2023-06-26 MED ORDER — SEMAGLUTIDE-WEIGHT MANAGEMENT 0.5 MG/0.5ML ~~LOC~~ SOAJ: 0.5000 mg | SUBCUTANEOUS | 0 refills | Status: DC

## 2023-06-26 MED ORDER — SEMAGLUTIDE-WEIGHT MANAGEMENT 1.7 MG/0.75ML ~~LOC~~ SOAJ: 1.7000 mg | SUBCUTANEOUS | 0 refills | Status: DC

## 2023-06-26 MED ORDER — SEMAGLUTIDE-WEIGHT MANAGEMENT 2.4 MG/0.75ML ~~LOC~~ SOAJ: 2.4000 mg | SUBCUTANEOUS | 0 refills | Status: DC

## 2023-06-26 MED ORDER — SEMAGLUTIDE-WEIGHT MANAGEMENT 1 MG/0.5ML ~~LOC~~ SOAJ: 1.0000 mg | SUBCUTANEOUS | 0 refills | Status: DC

## 2023-06-26 NOTE — Assessment & Plan Note (Signed)
Appears chronic, ongoing She reports sleep initiation issues due to racing thoughts She did not like Trazodone or Seroquel which was started at her last apt in July - removed from her med list per request She reports PTSD-like symptoms  She is not in therapy or taking anxiolytic/antidepressant per med list  She would like to hold off on trying anything new for now  Will follow up in about 3 months or sooner if concerns arise

## 2023-06-26 NOTE — Patient Instructions (Addendum)
Sleep hygiene (Practices to help improve sleep)  *Try to go to bed at the same time every night *Make your room as dark and quiet as possible for sleep (ambient noises or sleep machine is okay too) *Try to establish a bedtime routine before bed (shower, reading for a little bit, etc.) *No TVs or phone for at least to an hour before bed. (The blue light from these devices can affect your sleep-wake cycle and make it harder to go to sleep) *Avoid caffeine in the afternoon and especially before bedtime.  The goal is to establish a relaxing environment and routine that signals your body that it is time to go to sleep.    Please remember that the [facility] will change the way your body moves food through your GI tract. It will slow it down and make you feel less hungry. Try to eat small meals throughout the day with protein, fat, carbohydrates in that order Make sure you are drinking plenty of water and getting in enough fiber as this will help with constipation If you are feeling constipated, using Dulcolax or Miralax can help provide relief If you have further questions or concerns please let us know.
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
## 2023-06-26 NOTE — Assessment & Plan Note (Signed)
Chronic, ongoing  She is trying to track calories - reports she is trying to eat 2 meals per day and not eat after 8 pm She has cut out sodas and sweet drinks/juices, she is drinking flavored waters instead She is trying to exercise- she has a [employer] to do water aerobics but has had recent skin surgery that is preventing her from total immersion  She is trying to walk but this is limited by her CHF  Will send int script for [facility] med panel and initiate prior authorization today Reviewed side effects and contraindications as well as recommended changes to current diet to help with medication action and efficacy Follow up in 3 months or sooner if concerns arise

## 2023-06-27 LAB — MICROALBUMIN / CREATININE URINE RATIO
Creatinine, Urine: 182 mg/dL (ref 20–275)
Microalb Creat Ratio: 9 mg/g{creat} (ref <30)
Microalb, Ur: 1.6 mg/dL

## 2023-06-27 LAB — CBC WITH DIFFERENTIAL/PLATELET
Absolute Lymphocytes: 1833 {cells}/uL (ref 850–3900)
Absolute Monocytes: 601 {cells}/uL (ref 200–950)
Basophils Absolute: 31 {cells}/uL (ref 0–200)
Basophils Relative: 0.4 %
Eosinophils Absolute: 123 {cells}/uL (ref 15–500)
Eosinophils Relative: 1.6 %
HCT: 41.5 % (ref 35.0–45.0)
Hemoglobin: 13.5 g/dL (ref 11.7–15.5)
MCH: 29.7 pg (ref 27.0–33.0)
MCHC: 32.5 g/dL (ref 32.0–36.0)
MCV: 91.4 fL (ref 80.0–100.0)
MPV: 11.5 fL (ref 7.5–12.5)
Monocytes Relative: 7.8 %
Neutro Abs: 5113 {cells}/uL (ref 1500–7800)
Neutrophils Relative %: 66.4 %
Platelets: 170 10*3/uL (ref 140–400)
RBC: 4.54 10*6/uL (ref 3.80–5.10)
RDW: 12.1 % (ref 11.0–15.0)
Total Lymphocyte: 23.8 %
WBC: 7.7 10*3/uL (ref 3.8–10.8)

## 2023-06-27 LAB — COMPLETE METABOLIC PANEL WITH GFR
AG Ratio: 1.6 (calc) (ref 1.0–2.5)
ALT: 13 U/L (ref 6–29)
AST: 17 U/L (ref 10–35)
Albumin: 4 g/dL (ref 3.6–5.1)
Alkaline phosphatase (APISO): 69 U/L (ref 37–153)
BUN: 16 mg/dL (ref 7–25)
CO2: 32 mmol/L (ref 20–32)
Calcium: 9.6 mg/dL (ref 8.6–10.4)
Chloride: 100 mmol/L (ref 98–110)
Creat: 0.81 mg/dL (ref 0.50–1.05)
Globulin: 2.5 g/dL (ref 1.9–3.7)
Glucose, Bld: 95 mg/dL (ref 65–99)
Potassium: 4.6 mmol/L (ref 3.5–5.3)
Sodium: 139 mmol/L (ref 135–146)
Total Bilirubin: 0.6 mg/dL (ref 0.2–1.2)
Total Protein: 6.5 g/dL (ref 6.1–8.1)
eGFR: 79 mL/min/{1.73_m2} (ref >=60)

## 2023-06-27 LAB — HEMOGLOBIN A1C
Hgb A1c MFr Bld: 6.4 %{Hb} — ABNORMAL HIGH (ref <5.7)
Mean Plasma Glucose: 137 mg/dL
eAG (mmol/L): 7.6 mmol/L

## 2023-06-27 NOTE — Progress Notes (Signed)
Your labs are back Your A1c was 6.4 which is in the prediabetic range and at the upper limit before passing over into prediabetes. No medications are indicated at this time but I do recommend reducing your sugar and carb intake and making sure you are exercising regularly Your electrolytes, liver and kidney function were overall normal at this time Your cbc was normal- no signs of anemia Your urine microalbumin/creatinine testing was in normal range. (This was the repeat test of your home testing that we discussed)  For now I recommend the above indications. If you are approved for the  [facility], this should also help with the prediabetes but I do strongly recommend lifestyle changes as well to be more successful with your goals.  Please let us know if you have further questions or concerns.

## 2023-06-27 NOTE — Telephone Encounter (Signed)
Requested medications are due for refill today.  yes  Requested medications are on the active medications list.  yes  Last refill. 06/26/2023 - ordered not filled  Future visit scheduled.   no  Notes to clinic.  Pharmacy comment: Alternative Requested:NOT COVERED - NOT FORMULARY.     Requested Prescriptions  Pending Prescriptions Disp Refills   WEGOVY 0.25 MG/0.5ML SOAJ [Pharmacy Med Name: WEGOVY 0.25 MG/0.5 ML PEN]  0    Sig: INJECT 0.25 MG INTO THE SKIN ONCE A WEEK FOR 28 DAYS     Endocrinology:  Diabetes - GLP-1 Receptor Agonists - semaglutide Failed - 06/26/2023  3:50 PM      Failed - HBA1C in normal range and within 180 days    Hemoglobin A1C  Date Value Ref Range Status  07/07/2020 5.6  Final   Hgb A1c MFr Bld  Date Value Ref Range Status  06/26/2023 6.4 (H) <5.7 % of total Hgb Final    Comment:    For someone without known diabetes, a hemoglobin  A1c value between 5.7% and 6.4% is consistent with prediabetes and should be confirmed with a  follow-up test. . For someone with known diabetes, a value <7% indicates that their diabetes is well controlled. A1c targets should be individualized based on duration of diabetes, age, comorbid conditions, and other considerations. . This assay result is consistent with an increased risk of diabetes. . Currently, no consensus exists regarding use of hemoglobin A1c for diagnosis of diabetes for children. .          Passed - Cr in normal range and within 360 days    Creat  Date Value Ref Range Status  06/26/2023 0.81 0.50 - 1.05 mg/dL Final   Creatinine, Urine  Date Value Ref Range Status  06/26/2023 182 20 - 275 mg/dL Final         Passed - Valid encounter within last 6 months    Recent Outpatient Visits           Yesterday Class 3 severe obesity due to excess calories with serious comorbidity and body mass index (BMI) of 40.0 to 44.9 in adult [facility])   [facility] Mecum, Oswaldo Conroy, PA-C    3 months ago Annual physical exam   [facility] Danelle Berry, PA-C   4 months ago Muscle spasms of both lower extremities   [facility] Danelle Berry, PA-C   7 months ago Dysuria   [facility] Danelle Berry, PA-C   7 months ago Urinary tract infection with hematuria, site unspecified   [facility] Danelle Berry, PA-C       Future Appointments             In 4 months Alben Spittle, Evern Bio, PA-C [facility] [employer], [employer]

## 2023-06-28 NOTE — Telephone Encounter (Signed)
Just did PA today on it to see for insurance determination

## 2023-06-29 ENCOUNTER — Telehealth: Payer: Self-pay | Admitting: *Deleted

## 2023-06-29 ENCOUNTER — Encounter: Payer: Self-pay | Admitting: *Deleted

## 2023-06-29 DIAGNOSIS — R079 Chest pain, unspecified: Secondary | ICD-10-CM

## 2023-06-29 NOTE — Telephone Encounter (Signed)
Agree. Lexiscan NUC as below  Donato Schultz, MD   Spoke with pt who is agreeable to try to complete the Lexiscan.  I explained the stress procedure and she states as long as she is not made to lay down and be strapped in she should be fine.  Advised the testing is completed in a chair and she will be sitting up.  She is agreeable to try it.  Instructions to be sent to pt via MyChart and she is aware she will be contacted to be scheduled.
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
## 2023-07-03 ENCOUNTER — Encounter: Payer: Self-pay | Admitting: Physician Assistant

## 2023-07-03 DIAGNOSIS — D0472 Carcinoma in situ of skin of left lower limb, including hip: Secondary | ICD-10-CM | POA: Diagnosis not present

## 2023-07-03 DIAGNOSIS — L538 Other specified erythematous conditions: Secondary | ICD-10-CM | POA: Diagnosis not present

## 2023-07-03 DIAGNOSIS — Z48817 Encounter for surgical aftercare following surgery on the skin and subcutaneous tissue: Secondary | ICD-10-CM | POA: Diagnosis not present

## 2023-07-03 DIAGNOSIS — L820 Inflamed seborrheic keratosis: Secondary | ICD-10-CM | POA: Diagnosis not present

## 2023-07-03 DIAGNOSIS — R208 Other disturbances of skin sensation: Secondary | ICD-10-CM | POA: Diagnosis not present

## 2023-07-03 NOTE — Telephone Encounter (Signed)
PA Denied

## 2023-07-03 NOTE — Telephone Encounter (Signed)
Called pt no answer left detailed vm.

## 2023-07-05 ENCOUNTER — Ambulatory Visit
Admission: RE | Admit: 2023-07-05 | Discharge: 2023-07-05 | Disposition: A | Discharge status: Home / Self Care | Payer: Medicare Other | Source: Ambulatory Visit | Attending: Physician Assistant | Admitting: Physician Assistant

## 2023-07-05 DIAGNOSIS — H02882 Meibomian gland dysfunction right lower eyelid: Secondary | ICD-10-CM | POA: Diagnosis not present

## 2023-07-05 DIAGNOSIS — Z1231 Encounter for screening mammogram for malignant neoplasm of breast: Secondary | ICD-10-CM | POA: Diagnosis not present

## 2023-07-05 DIAGNOSIS — H02885 Meibomian gland dysfunction left lower eyelid: Secondary | ICD-10-CM | POA: Diagnosis not present

## 2023-07-05 DIAGNOSIS — H1045 Other chronic allergic conjunctivitis: Secondary | ICD-10-CM | POA: Diagnosis not present

## 2023-07-10 NOTE — Progress Notes (Signed)
Mammogram did not find evidence of malignancy Recommended repeat screening mammogram in one year.

## 2023-07-13 ENCOUNTER — Telehealth (HOSPITAL_COMMUNITY): Payer: Self-pay | Admitting: *Deleted

## 2023-07-13 NOTE — Telephone Encounter (Signed)
Patient given detailed instructions per Myocardial Perfusion Study Information Sheet for the test on 07/17/2023 at 11:00. Patient notified to arrive 15 minutes early and that it is imperative to arrive on time for appointment to keep from having the test rescheduled.  If you need to cancel or reschedule your appointment, please call the office within 24 hours of your appointment. . Patient verbalized understanding.Patricia Fischer

## 2023-07-17 ENCOUNTER — Ambulatory Visit (HOSPITAL_COMMUNITY): Payer: Medicare Other | Attending: Cardiology

## 2023-07-17 DIAGNOSIS — R079 Chest pain, unspecified: Secondary | ICD-10-CM | POA: Insufficient documentation

## 2023-07-17 MED ORDER — TECHNETIUM TC 99M TETROFOSMIN IV KIT
28.3000 | PACK | Freq: Once | INTRAVENOUS | Status: AC | PRN
  Administered 2023-07-17: 28.3 via INTRAVENOUS

## 2023-07-17 MED ORDER — REGADENOSON 0.4 MG/5ML IV SOLN
0.4000 mg | Freq: Once | INTRAVENOUS | Status: AC
  Administered 2023-07-17: 0.4 mg via INTRAVENOUS

## 2023-07-17 MED ORDER — AMINOPHYLLINE 25 MG/ML IV SOLN
75.0000 mg | Freq: Once | INTRAVENOUS | Status: AC
  Administered 2023-07-17: 75 mg via INTRAVENOUS

## 2023-07-19 ENCOUNTER — Ambulatory Visit (HOSPITAL_COMMUNITY): Payer: Medicare Other | Attending: Cardiology

## 2023-07-19 LAB — MYOCARDIAL PERFUSION IMAGING
LV dias vol: 129 mL (ref 46–106)
LV sys vol: 98 mL
Nuc Stress EF: 25 %
Peak HR: 106 {beats}/min
Rest HR: 75 {beats}/min
Rest Nuclear Isotope Dose: 31.7 mCi
SDS: 1
SRS: 0
SSS: 1
ST Depression (mm): 0 mm
Stress Nuclear Isotope Dose: 28.3 mCi
TID: 0.96

## 2023-07-19 MED ORDER — TECHNETIUM TC 99M TETROFOSMIN IV KIT
31.7000 | PACK | Freq: Once | INTRAVENOUS | Status: AC | PRN
  Administered 2023-07-19: 31.7 via INTRAVENOUS

## 2023-07-21 DIAGNOSIS — M170 Bilateral primary osteoarthritis of knee: Secondary | ICD-10-CM | POA: Diagnosis not present

## 2023-07-21 DIAGNOSIS — H1045 Other chronic allergic conjunctivitis: Secondary | ICD-10-CM | POA: Diagnosis not present

## 2023-07-21 DIAGNOSIS — H02885 Meibomian gland dysfunction left lower eyelid: Secondary | ICD-10-CM | POA: Diagnosis not present

## 2023-07-21 DIAGNOSIS — H02882 Meibomian gland dysfunction right lower eyelid: Secondary | ICD-10-CM | POA: Diagnosis not present

## 2023-07-31 ENCOUNTER — Encounter: Payer: Self-pay | Admitting: Cardiology

## 2023-07-31 ENCOUNTER — Ambulatory Visit: Payer: Medicare Other | Attending: Cardiology | Admitting: Cardiology

## 2023-07-31 VITALS — BP 114/76 | HR 76 | Ht 64.0 in | Wt 238.0 lb

## 2023-07-31 DIAGNOSIS — I447 Left bundle-branch block, unspecified: Secondary | ICD-10-CM

## 2023-07-31 DIAGNOSIS — R079 Chest pain, unspecified: Secondary | ICD-10-CM

## 2023-07-31 DIAGNOSIS — I5022 Chronic systolic (congestive) heart failure: Secondary | ICD-10-CM

## 2023-07-31 NOTE — Progress Notes (Signed)
Cardiology Office Note:  .   Date:  07/31/2023  ID:  Patricia Fischer, DOB 04-07-1956, MRN [id number] PCP: Danelle Berry, PA-C  [facility] Providers Cardiologist:  Donato Schultz, MD     History of Present Illness: .   Patricia Fischer is a 67 y.o. female Discussed the use of AI scribe software for clinical note transcription with the patient, who gave verbal consent to proceed.  History of Present Illness   The patient, a 67 year old with a history of non-ischemic cardiomyopathy, pulmonary embolism, and chest pain, presents for follow-up after a nuclear stress test. The test revealed an ejection fraction (EF) of 25%, a significant decrease from the previous EF of 40% noted in 2024. The patient has a large defect from the apical to base anteroceptal, inferior anteroceptal, which is partially reversible. A left frontal branch block was also noted. The patient's cardiac catheterization in 2017 showed widely patent coronary arteries with a 50% narrowing of the circumflex, which may have been catheter-induced spasm. The EF at that time was 35%.  The patient reports feeling increasingly tired, which she attributes to the decreased EF. She also describes a strange chest pain that is triggered by physical contact, similar to running into a door. The patient also reports shortness of breath, particularly when initiating physical activity. However, once she gets going, she feels fine. The patient also mentions difficulty keeping up with others during a recent trip due to fatigue.  The patient is currently on Entresto 24/26, Spironolactone 25, and Carvedilol 6.25 twice a day. She has not had a defibrillator in place and has previously been seen in an advanced heart failure clinic. The patient also reports having anxiety, which is exacerbated by medical procedures. She has had two previous heart catheterizations, one through the groin and one through the hand.  The patient also mentions having dry  eyes and needing to take something before going to the dentist due to anxiety. She has been trying to increase her physical activity by walking to the end of the road and back every day, but reports feeling winded and needing to push herself to continue.          Studies Reviewed: Marland Kitchen   EKG Interpretation Date/Time:  Monday July 31 2023 14:28:33 EST Ventricular Rate:  75 PR Interval:  216 QRS Duration:  148 QT Interval:  426 QTC Calculation: 475 R Axis:   218  Text Interpretation: Sinus rhythm with 1st degree A-V block Non-specific intra-ventricular conduction block Possible Lateral infarct , age undetermined When compared with ECG of 03-Jun-2021 14:55, QRS axis Shifted left Confirmed by Donato Schultz ([id number]) on 07/31/2023 2:36:57 PM    Results DIAGNOSTIC Nuclear stress test: EF 25%, large defect from apical to base anteroseptal, inferior anteroseptal, partially reversible Cardiac catheterization: Widely patent coronary arteries with ostial 50% narrowing of circumflex, LVEF 35% (2017) Echocardiogram: EF 40%, left bundle branch block asynchrony (2024) EKG: Normal rhythm, left bundle branch block, first degree AV block (07/31/2023)  Risk Assessment/Calculations:            Physical Exam:   VS:  BP 114/76   Pulse 76   Ht 5\' 4"  (1.626 m)   Wt 238 lb (108 kg)   SpO2 95%   BMI 40.85 kg/m    Wt Readings from Last 3 Encounters:  07/31/23 238 lb (108 kg)  07/17/23 235 lb (106.6 kg)  06/26/23 235 lb 4.8 oz (106.7 kg)    GEN: Well nourished, well developed in no  acute distress NECK: No JVD; No carotid bruits CARDIAC: RRR, no murmurs, no rubs, no gallops RESPIRATORY:  Clear to auscultation without rales, wheezing or rhonchi  ABDOMEN: Soft, non-tender, non-distended EXTREMITIES:  No edema; No deformity   ASSESSMENT AND PLAN: .    Assessment and Plan    Heart Failure with Reduced Ejection Fraction (HFrEF) Non-ischemic cardiomyopathy with recent nuclear stress test showing EF  of 25%, down from 40% in 2024. Reports increased fatigue, dyspnea, and intermittent chest pain. Stress test indicated large defect from apical to base anteroceptal, inferior anteroceptical, partially reversible, suggesting possible ischemia. Prior cardiac catheterization in 2017 showed widely patent coronary arteries with osteo 50% narrowing of circumflex, possibly catheter-induced spasm. Currently on Entresto, spironolactone, and carvedilol. Discussed potential benefits of CRT-D device to improve heart function and monitor for arrhythmias. Patient expressed anxiety and reluctance towards invasive procedures. Discussed alternative of regular exercise to improve symptoms. - Continue Entresto 24/26 mg, spironolactone 25 mg, carvedilol 6.25 mg BID - Encourage regular exercise, such as using a stationary bike for 30 minutes daily - Follow up with Lorin Picket in February to reassess symptoms and treatment plan. Consider repeat ECHO to assess EF once again.   Left Bundle Branch Block (LBBB) Confirmed on most recent EKG showing normal rhythm with no changes from previous assessments. - Monitor for any changes in symptoms or EKG findings  Pulmonary Embolism (History) Mentioned in context of overall cardiac history. No current symptoms or treatment changes discussed.  General Health Maintenance Expressed concerns about weight and mentioned previous attempt to get on Wegovy, denied by insurance. Emphasized importance of regular exercise to improve overall health and potentially alleviate some symptoms of heart failure. - Encourage regular exercise, such as walking or using a stationary bike - Reassess weight management options during follow-up visits  Follow-up - Ensure follow-up appointment with Lorin Picket in February is kept.              Signed, Donato Schultz, MD

## 2023-07-31 NOTE — Patient Instructions (Signed)
Medication Instructions:  The current medical regimen is effective;  continue present plan and medications.  *If you need a refill on your cardiac medications before your next appointment, please call your pharmacy*  Follow-Up: At [facility], you and your health needs are our priority.  As part of our continuing mission to provide you with exceptional heart care, we have created designated Provider Care Teams.  These Care Teams include your primary Cardiologist (physician) and Advanced Practice Providers (APPs -  Physician Assistants and Nurse Practitioners) who all work together to provide you with the care you need, when you need it.  We recommend signing up for the patient portal called "MyChart".  Sign up information is provided on this After Visit Summary.  MyChart is used to connect with patients for Virtual Visits (Telemedicine).  Patients are able to view lab/test results, encounter notes, upcoming appointments, etc.  Non-urgent messages can be sent to your provider as well.   To learn more about what you can do with MyChart, go to [email or web address].    Your next appointment:   Follow up as scheduled.

## 2023-08-16 ENCOUNTER — Other Ambulatory Visit: Payer: Self-pay | Admitting: Family Medicine

## 2023-08-16 DIAGNOSIS — Z87442 Personal history of urinary calculi: Secondary | ICD-10-CM

## 2023-08-22 ENCOUNTER — Other Ambulatory Visit: Payer: Self-pay | Admitting: Nurse Practitioner

## 2023-08-22 DIAGNOSIS — G4700 Insomnia, unspecified: Secondary | ICD-10-CM

## 2023-08-22 NOTE — Telephone Encounter (Signed)
Requested medication (s) are due for refill today: No  Requested medication (s) are on the active medication list: NO    Last refill: NA  Future visit scheduled NA  Notes to clinic:Med discontinued 06/26/23.   Requested Prescriptions  Pending Prescriptions Disp Refills   QUEtiapine (SEROQUEL) 25 MG tablet [Pharmacy Med Name: QUETIAPINE FUMARATE 25 MG TAB] 90 tablet 0    Sig: TAKE 1 TABLET BY MOUTH AT BEDTIME AS NEEDED (INSOMNIA (USE SEROQEL OR TRAZODONE - DO NOT USE BOTH)).     Not Delegated - Psychiatry:  Antipsychotics - Second Generation (Atypical) - quetiapine Failed - 08/22/2023  5:29 PM      Failed - This refill cannot be delegated      Failed - TSH in normal range and within 360 days    TSH  Date Value Ref Range Status  10/04/2019 0.82 0.40 - 4.50 mIU/L Final         Failed - Lipid Panel in normal range within the last 12 months    Cholesterol, Total  Date Value Ref Range Status  10/06/2022 100 100 - 199 mg/dL Final   LDL Cholesterol (Calc)  Date Value Ref Range Status  03/15/2022 120 (H) mg/dL (calc) Final    Comment:    Reference range: <100 . Desirable range <100 mg/dL for primary prevention;   <70 mg/dL for patients with CHD or diabetic patients  with > or = 2 CHD risk factors. Marland Kitchen LDL-C is now calculated using the Martin-Hopkins  calculation, which is a validated novel method providing  better accuracy than the Friedewald equation in the  estimation of LDL-C.  Horald Pollen et al. Lenox Ahr. 1610;960(45): 2061-2068  (http://education.[employer].com/faq/FAQ164)    LDL Chol Calc (NIH)  Date Value Ref Range Status  10/06/2022 37 0 - 99 mg/dL Final   HDL  Date Value Ref Range Status  10/06/2022 49 >39 mg/dL Final   Triglycerides  Date Value Ref Range Status  10/06/2022 59 0 - 149 mg/dL Final         Passed - Completed PHQ-2 or PHQ-9 in the last 360 days      Passed - Last BP in normal range    BP Readings from Last 1 Encounters:  07/31/23 114/76          Passed - Last Heart Rate in normal range    Pulse Readings from Last 1 Encounters:  07/31/23 76         Passed - Valid encounter within last 6 months    Recent Outpatient Visits           1 month ago Class 3 severe obesity due to excess calories with serious comorbidity and body mass index (BMI) of 40.0 to 44.9 in adult [facility])   [facility] Mecum, Oswaldo Conroy, PA-C   5 months ago Annual physical exam   [facility] Danelle Berry, PA-C   6 months ago Muscle spasms of both lower extremities   [facility] Danelle Berry, PA-C   9 months ago Dysuria   [facility] Danelle Berry, PA-C   9 months ago Urinary tract infection with hematuria, site unspecified   [facility] Danelle Berry, PA-C       Future Appointments             In 2 months Alben Spittle, Evern Bio, PA-C [facility] [employer], [id number]  Passed - CBC within normal limits and completed in the last 12 months    WBC  Date Value Ref Range Status  06/26/2023 7.7 3.8 - 10.8 Thousand/uL Final   RBC  Date Value Ref Range Status  06/26/2023 4.54 3.80 - 5.10 Million/uL Final   Hemoglobin  Date Value Ref Range Status  06/26/2023 13.5 11.7 - 15.5 g/dL Final  16/06/9603 54.0 11.1 - 15.9 g/dL Final   HCT  Date Value Ref Range Status  06/26/2023 41.5 35.0 - 45.0 % Final   Hematocrit  Date Value Ref Range Status  02/11/2021 36.3 34.0 - 46.6 % Final   MCHC  Date Value Ref Range Status  06/26/2023 32.5 32.0 - 36.0 g/dL Final    Comment:    For adults, a slight decrease in the calculated MCHC value (in the range of 30 to 32 g/dL) is most likely not clinically significant; however, it should be interpreted with caution in correlation with other red cell parameters and the patient's clinical condition.    [facility]  Date Value Ref Range Status  06/26/2023 29.7 27.0 -  33.0 pg Final   MCV  Date Value Ref Range Status  06/26/2023 91.4 80.0 - 100.0 fL Final  02/11/2021 83 79 - 97 fL Final   No results found for: "PLTCOUNTKUC", "LABPLAT", "POCPLA" RDW  Date Value Ref Range Status  06/26/2023 12.1 11.0 - 15.0 % Final  02/11/2021 14.3 11.7 - 15.4 % Final         Passed - CMP within normal limits and completed in the last 12 months    Albumin  Date Value Ref Range Status  10/06/2022 4.2 3.9 - 4.9 g/dL Final   Alkaline Phosphatase  Date Value Ref Range Status  10/06/2022 79 44 - 121 IU/L Final   Alkaline phosphatase (APISO)  Date Value Ref Range Status  06/26/2023 69 37 - 153 U/L Final   ALT  Date Value Ref Range Status  06/26/2023 13 6 - 29 U/L Final   AST  Date Value Ref Range Status  06/26/2023 17 10 - 35 U/L Final   BUN  Date Value Ref Range Status  06/26/2023 16 7 - 25 mg/dL Final  98/07/9146 13 8 - 27 mg/dL Final   Calcium  Date Value Ref Range Status  06/26/2023 9.6 8.6 - 10.4 mg/dL Final   CO2  Date Value Ref Range Status  06/26/2023 32 20 - 32 mmol/L Final   Bicarbonate  Date Value Ref Range Status  10/28/2015 23.1 20.0 - 24.0 mEq/L Final   TCO2  Date Value Ref Range Status  10/28/2015 24 0 - 100 mmol/L Final   Creat  Date Value Ref Range Status  06/26/2023 0.81 0.50 - 1.05 mg/dL Final   Creatinine, Urine  Date Value Ref Range Status  06/26/2023 182 20 - 275 mg/dL Final   Glucose, Bld  Date Value Ref Range Status  06/26/2023 95 65 - 99 mg/dL Final    Comment:    .            Fasting reference interval .    Potassium  Date Value Ref Range Status  06/26/2023 4.6 3.5 - 5.3 mmol/L Final   Sodium  Date Value Ref Range Status  06/26/2023 139 135 - 146 mmol/L Final  10/06/2022 142 134 - 144 mmol/L Final   Total Bilirubin  Date Value Ref Range Status  06/26/2023 0.6 0.2 - 1.2 mg/dL Final   Bilirubin Total  Date Value Ref Range Status  10/06/2022 0.6 0.0 - 1.2 mg/dL Final   Bilirubin, Direct  Date  Value Ref Range Status  09/30/2019 0.05 0.00 - 0.40 mg/dL Final   Indirect Bilirubin  Date Value Ref Range Status  08/25/2015 0.3 0.2 - 1.2 mg/dL Final   Protein, ur  Date Value Ref Range Status  11/15/2022 NEGATIVE NEGATIVE Final   Protein,UA  Date Value Ref Range Status  12/08/2022 Negative Negative/Trace Final   Protein, UA  Date Value Ref Range Status  02/06/2023 Negative Negative Final   Total Protein  Date Value Ref Range Status  06/26/2023 6.5 6.1 - 8.1 g/dL Final  74/25/9563 6.3 6.0 - 8.5 g/dL Final   GFR, Est African American  Date Value Ref Range Status  10/04/2019 99 > OR = 60 mL/min/1.59m2 Final   eGFR  Date Value Ref Range Status  06/26/2023 79 > OR = 60 mL/min/1.10m2 Final  10/06/2022 83 >59 mL/min/1.73 Final   GFR, Est Non African American  Date Value Ref Range Status  10/04/2019 86 > OR = 60 mL/min/1.50m2 Final   GFR, Estimated  Date Value Ref Range Status  09/07/2021 >60 >60 mL/min Final    Comment:    (NOTE) Calculated using the CKD-EPI Creatinine Equation (2021)

## 2023-09-03 ENCOUNTER — Other Ambulatory Visit: Payer: Self-pay | Admitting: Family Medicine

## 2023-09-03 DIAGNOSIS — J441 Chronic obstructive pulmonary disease with (acute) exacerbation: Secondary | ICD-10-CM

## 2023-09-21 ENCOUNTER — Other Ambulatory Visit: Payer: Self-pay | Admitting: Family Medicine

## 2023-09-21 DIAGNOSIS — J441 Chronic obstructive pulmonary disease with (acute) exacerbation: Secondary | ICD-10-CM

## 2023-10-09 DIAGNOSIS — H1045 Other chronic allergic conjunctivitis: Secondary | ICD-10-CM | POA: Diagnosis not present

## 2023-10-09 DIAGNOSIS — H353111 Nonexudative age-related macular degeneration, right eye, early dry stage: Secondary | ICD-10-CM | POA: Diagnosis not present

## 2023-10-09 DIAGNOSIS — H02882 Meibomian gland dysfunction right lower eyelid: Secondary | ICD-10-CM | POA: Diagnosis not present

## 2023-10-09 DIAGNOSIS — H353221 Exudative age-related macular degeneration, left eye, with active choroidal neovascularization: Secondary | ICD-10-CM | POA: Diagnosis not present

## 2023-10-09 DIAGNOSIS — H2513 Age-related nuclear cataract, bilateral: Secondary | ICD-10-CM | POA: Diagnosis not present

## 2023-10-09 DIAGNOSIS — H02885 Meibomian gland dysfunction left lower eyelid: Secondary | ICD-10-CM | POA: Diagnosis not present

## 2023-10-09 DIAGNOSIS — M170 Bilateral primary osteoarthritis of knee: Secondary | ICD-10-CM | POA: Diagnosis not present

## 2023-10-10 DIAGNOSIS — H35033 Hypertensive retinopathy, bilateral: Secondary | ICD-10-CM | POA: Diagnosis not present

## 2023-10-10 DIAGNOSIS — H2513 Age-related nuclear cataract, bilateral: Secondary | ICD-10-CM | POA: Diagnosis not present

## 2023-10-10 DIAGNOSIS — H353122 Nonexudative age-related macular degeneration, left eye, intermediate dry stage: Secondary | ICD-10-CM | POA: Diagnosis not present

## 2023-10-10 DIAGNOSIS — H43823 Vitreomacular adhesion, bilateral: Secondary | ICD-10-CM | POA: Diagnosis not present

## 2023-10-27 DIAGNOSIS — M170 Bilateral primary osteoarthritis of knee: Secondary | ICD-10-CM | POA: Diagnosis not present

## 2023-10-29 NOTE — Progress Notes (Signed)
 Cardiology Office Note:    Date:  10/30/2023  ID:  Patricia Fischer, DOB 03/24/1956, MRN [id number] PCP: Danelle Berry, PA-C  [facility] Providers Cardiologist:  Donato Schultz, MD       Patient Profile:      (HFrEF) heart failure with reduced ejection fraction  Non-ischemic cardiomyopathy Cath 10/28/15: oLCx 50 - prob spasm  Hx of intol of Entresto (ortho hypotension) Intol of SGLT2i 2/2 UTIs Previously followed by Dr. Gala Romney in [facility] Echocardiogram 12/16: EF 35-40 Echocardiogram 5/18: EF 30-35, Gr 1 DD cMRI 12/18: EF 52, no LGE Echocardiogram 6/22: EF 25-30 CPET 08/10/2021: Main limitation ventilatory related to obesity and restrictive lung physiology; elevated VE/VCO2 suggest mild HF limitation TTE 10/18/2021: EF 20-25 TTE 10/06/2022: EF 40, GR 1 DD, normal RVSF, trivial MR, RAP 3 Myoview 07/17/2023: Inferior and apical infarct with apical peri-infarct ischemia, EF 25, intermediate risk Hx of recurrent pulmonary embolism  On chronic anticoagulation - Rx w/ Rivaroxaban Secondary pulmonary hypertension  Breast CA Hypertension  Left Bundle Branch Block Hx of DVT and pulmonary embolism in 2016 COPD         Discussed the use of AI scribe software for clinical note transcription with the patient, who gave verbal consent to proceed. History of Present Illness Patricia Fischer is a 68 year old female with heart failure with reduced ejection fraction who presents for follow-up of CHF. She is a prior pt of Dr. Katrinka Blazing. She was last seen in clinic by Dr. Anne Fu in 07/2023. Cardiac catheterization had been considered prior to that visit b/c of a recent Myoview which showed infarct with peri-infarct ischemia. However, it was felt that did not need a cardiac catheterization. CRT-D was discussed but she preferred to avoid procedures.   She is here alone. She has experienced progressively worsening shortness of breath over the last few months, significantly affecting her ability  to walk and climb stairs, necessitating frequent stops. She is unable to lie flat and uses three pillows to sleep, a practice ongoing for about a year. No recent chest pain, palpitations, or peripheral edema. Her weight and oxygen levels have remained stable. Approximately three weeks ago, she experienced a syncopal episode upon standing quickly from a seated position. She does not recall any preceding dizziness or the fall itself, only realizing she was on the floor afterward. No similar episodes have occurred since, and she denies dizziness or lightheadedness. She reports a recent episode of L neck pain that began a couple of weeks ago, characterized by a bulging sensation on the left side of her neck radiating to her shoulder blades and chest. The area was tender and sore but has since resolved. No associated cold symptoms or recent infections. She has a history of knee pain and arthritis, for which she recently started receiving gel injections after insurance approval. She is considering cataract surgery and inquires about her heart's condition for the procedure.  Review of Systems  Musculoskeletal:  Positive for neck pain.  -See HPI    Studies Reviewed:       Results LABS - Chart Review K: 4.6 (06/26/2023) Cr: 0.81 (06/26/2023) ALT: 17 (06/26/2023) Total cholesterol: 100 (10/06/2022) HDL: 49 (10/06/2022) Triglycerides: 59 (10/06/2022) LDL: 37 (10/06/2022) Hb: 13.5 (06/26/2023) A1c: 6.4 (06/26/2023)   Risk Assessment/Calculations:             Physical Exam:   VS:  BP 117/70   Pulse 74   Ht 5' 4.5" (1.638 m)   Wt 240 lb  6.4 oz (109 kg)   SpO2 94%   BMI 40.63 kg/m    Wt Readings from Last 3 Encounters:  10/30/23 240 lb 6.4 oz (109 kg)  07/31/23 238 lb (108 kg)  07/17/23 235 lb (106.6 kg)    Constitutional:      Appearance: Healthy appearance. Not in distress.  Neck:     Vascular: JVD normal.  Pulmonary:     Breath sounds: Normal breath sounds. No wheezing. No rales.   Cardiovascular:     Normal rate. Regular rhythm.     Murmurs: There is no murmur.  Edema:    Peripheral edema absent.  Abdominal:     Palpations: Abdomen is soft.      Assessment and Plan:   Assessment & Plan HFrEF (heart failure with reduced ejection fraction) (HCC) Non-ischemic cardiomyopathy. No CAD on cath in 2017. EF as low as 20-25 in 10/2021. She was followed in the [facility]F Clinic in the past and CPET in 2022 showed main ventilatory limitation due to obesity. She did have mild HF limitation. EF improved to 40 by TTE in 10/2022. Recent nuclear stress test with EF 25. She did have inferior infarct with peri-infarct ischemia. It was decided to hold off on cardiac catheterization at last visit in 07/2023. She notes recent worsening of dyspnea on exertion. She is NYHA III. Knee pain limits her mobility as well. Volume appears stable on physical exam. We discussed the rationale, benefits of CRT-D again today.  -Continue Carvedilol 6.25mg  twice daily, Entresto 24/26mg  twice daily, Furosemide 40mg  daily as needed, and Spironolactone 25mg  daily. -She is intol of SGLT2i 2/2 frequent UTIs -Order BNP and basic metabolic panel to assess fluid status and kidney function. -Order follow-up echocardiogram to re-assess ejection fraction. -If ejection fraction remains low, consider referral to electrophysiology for CRT-D discussion. Syncope and collapse Likely vasovagal syncope related to orthostatic hypotension. There has been no recurrence. However, given history of cardiomyopathy, I would like her to wear a monitor to rule out arrhythmia. -Order Zio patch AT monitor for 2 weeks. Preoperative cardiovascular examination She needs cataract surgery. This is a low risk procedure and she does not need specific clearance. She does not need to hold any anticoagulation or antiplatelet medications for this.  Pulmonary hypertension (HCC) Related to prior pulmonary embolism. -She remains on Xarelto 10mg  daily for  anticoagulation. LBBB (left bundle branch block)-rate related  As noted, if EF remains low, would refer to EP for consideration of CRT-D.  Pure hypercholesterolemia LDL optimal. -Continue Crestor 20mg  daily. History of pulmonary embolism She is on lifelong anticoagulation.  Neck pain By her description, this was likely lymphadenopathy. It has since resolved. -No further action required at this time.        Dispo:  Return in about 3 months (around 01/27/2024) for Routine Follow Up, w/ Dr. Anne Fu, or Tereso Newcomer, PA-C.  Signed, Tereso Newcomer, PA-C

## 2023-10-30 ENCOUNTER — Other Ambulatory Visit (INDEPENDENT_AMBULATORY_CARE_PROVIDER_SITE_OTHER): Payer: Medicare Other

## 2023-10-30 ENCOUNTER — Other Ambulatory Visit: Payer: Self-pay | Admitting: Physician Assistant

## 2023-10-30 ENCOUNTER — Encounter: Payer: Self-pay | Admitting: Physician Assistant

## 2023-10-30 ENCOUNTER — Ambulatory Visit: Payer: Medicare Other | Attending: Physician Assistant | Admitting: Physician Assistant

## 2023-10-30 VITALS — BP 117/70 | HR 74 | Ht 64.5 in | Wt 240.4 lb

## 2023-10-30 DIAGNOSIS — I447 Left bundle-branch block, unspecified: Secondary | ICD-10-CM | POA: Diagnosis not present

## 2023-10-30 DIAGNOSIS — I272 Pulmonary hypertension, unspecified: Secondary | ICD-10-CM | POA: Diagnosis not present

## 2023-10-30 DIAGNOSIS — Z01810 Encounter for preprocedural cardiovascular examination: Secondary | ICD-10-CM

## 2023-10-30 DIAGNOSIS — I10 Essential (primary) hypertension: Secondary | ICD-10-CM | POA: Diagnosis not present

## 2023-10-30 DIAGNOSIS — R55 Syncope and collapse: Secondary | ICD-10-CM

## 2023-10-30 DIAGNOSIS — Z79899 Other long term (current) drug therapy: Secondary | ICD-10-CM

## 2023-10-30 DIAGNOSIS — I5020 Unspecified systolic (congestive) heart failure: Secondary | ICD-10-CM | POA: Diagnosis not present

## 2023-10-30 DIAGNOSIS — Z86711 Personal history of pulmonary embolism: Secondary | ICD-10-CM | POA: Diagnosis not present

## 2023-10-30 DIAGNOSIS — R0602 Shortness of breath: Secondary | ICD-10-CM | POA: Diagnosis not present

## 2023-10-30 DIAGNOSIS — E7800 Pure hypercholesterolemia, unspecified: Secondary | ICD-10-CM | POA: Diagnosis not present

## 2023-10-30 DIAGNOSIS — M542 Cervicalgia: Secondary | ICD-10-CM | POA: Diagnosis not present

## 2023-10-30 NOTE — Patient Instructions (Signed)
 Medication Instructions:  The current medical regimen is effective;  continue present plan and medications.  *If you need a refill on your cardiac medications before your next appointment, please call your pharmacy*   Lab Work: Please have blood work today at [facility] (BMP, pro-BNP)  If you have labs (blood work) drawn today and your tests are completely normal, you will receive your results only by: MyChart Message (if you have MyChart) OR A paper copy in the mail If you have any lab test that is abnormal or we need to change your treatment, we will call you to review the results.   Testing/Procedures: Your physician has requested that you have an echocardiogram. Echocardiography is a painless test that uses sound waves to create images of your heart. It provides your doctor with information about the size and shape of your heart and how well your heart's chambers and valves are working. This procedure takes approximately one hour. There are no restrictions for this procedure. Please do NOT wear cologne, perfume, aftershave, or lotions (deodorant is allowed). Please arrive 15 minutes prior to your appointment time.  Please note: We ask at that you not bring children with you during ultrasound (echo/ vascular) testing. Due to room size and safety concerns, children are not allowed in the ultrasound rooms during exams. Our front office staff cannot provide observation of children in our lobby area while testing is being conducted. An adult accompanying a patient to their appointment will only be allowed in the ultrasound room at the discretion of the ultrasound technician under special circumstances. We apologize for any inconvenience.  ZIO AT Long term monitor-Live Telemetry  Your physician has requested you wear a ZIO patch monitor for 14 days.  This is a single patch monitor. [employer] supplies one patch monitor per enrollment. Additional  stickers are not available.  Please do not apply  patch if you will be having a Nuclear Stress Test, Echocardiogram, Cardiac CT, MRI,  or Chest Xray during the period you would be wearing the monitor. The patch cannot be worn during  these tests. You cannot remove and re-apply the ZIO AT patch monitor.  Your ZIO patch monitor will be mailed 3 day USPS to your address on file. It may take 3-5 days to  receive your monitor after you have been enrolled.  Once you have received your monitor, please review the enclosed instructions. Your monitor has  already been registered assigning a specific monitor serial # to you.   Billing and Patient Assistance Program information  Meredeth Ide has been supplied with any insurance information on record for billing. [employer] offers a sliding scale Patient Assistance Program for patients without insurance, or whose  insurance does not completely cover the cost of the ZIO patch monitor. You must apply for the  Patient Assistance Program to qualify for the discounted rate. To apply, call [employer] at [phone],  select option 4, select option 2 , ask to apply for the Patient Assistance Program, (you can request an  interpreter if needed). [employer] will ask your household income and how many people are in your  household. [employer] will quote your out-of-pocket cost based on this information. They will also be able  to set up a 12 month interest free payment plan if needed.  Applying the monitor   Shave hair from upper left chest.  Hold the abrader disc by orange tab. Rub the abrader in 40 strokes over left upper chest as indicated in  your monitor instructions.  Clean area with 4 enclosed alcohol pads. Use all pads to ensure the area is cleaned thoroughly. Let  dry.  Apply patch as indicated in monitor instructions. Patch will be placed under collarbone on left side of  chest with arrow pointing upward.  Rub patch adhesive wings for 2 minutes. Remove the white label marked "1". Remove the white label  marked "2".  Rub patch adhesive wings for 2 additional minutes.  While looking in a mirror, press and release button in center of patch. A small green light will flash 3-4  times. This will be your only indicator that the monitor has been turned on.  Do not shower for the first 24 hours. You may shower after the first 24 hours.  Press the button if you feel a symptom. You will hear a small click. Record Date, Time and Symptom in  the Patient Log.   Starting the Gateway  In your kit there is a Audiological scientist box the size of a cellphone. This is Buyer, retail. It transmits all your  recorded data to [facility]. This box must always stay within 10 feet of you. Open the box and push the *  button. There will be a light that blinks orange and then green a few times. When the light stops  blinking, the Gateway is connected to the ZIO patch. Call [employer] at [phone] to confirm your monitor is transmitting.  Returning your monitor  Remove your patch and place it inside the Gateway. In the lower half of the Gateway there is a white  bag with prepaid postage on it. Place Gateway in bag and seal. Mail package back to [place] as soon as  possible. Your physician should have your final report approximately 7 days after you have mailed back  your monitor. Call [facility] Customer Care at [phone] if you have questions regarding your ZIO AT  patch monitor. Call them immediately if you see an orange light blinking on your monitor.  If your monitor falls off in less than 4 days, contact our Monitor department at [phone]. If your  monitor becomes loose or falls off after 4 days call [employer] at [phone] for suggestions on  securing your monitor   Follow-Up: At [facility], you and your health needs are our priority.  As part of our continuing mission to provide you with exceptional heart care, we have created designated Provider Care Teams.  These Care Teams include your primary  Cardiologist (physician) and Advanced Practice Providers (APPs -  Physician Assistants and Nurse Practitioners) who all work together to provide you with the care you need, when you need it.  We recommend signing up for the patient portal called "MyChart".  Sign up information is provided on this After Visit Summary.  MyChart is used to connect with patients for Virtual Visits (Telemedicine).  Patients are able to view lab/test results, encounter notes, upcoming appointments, etc.  Non-urgent messages can be sent to your provider as well.   To learn more about what you can do with MyChart, go to [email or web address].    Your next appointment:   3 month(s)  Provider:   Donato Schultz, MD  or Lilian Coma.       1st Floor: - Lobby - Registration  - Pharmacy  - Lab - Cafe  2nd Floor: - PV Lab - Diagnostic Testing (echo, CT, nuclear med)  3rd Floor: - Vacant  4th Floor: - TCTS (cardiothoracic surgery) - AFib Clinic - Structural Heart Clinic -  Vascular Surgery  - Vascular Ultrasound  5th Floor: - HeartCare Cardiology (general and EP) - Clinical Pharmacy for coumadin, hypertension, lipid, weight-loss medications, and med management appointments    Valet parking services will be available as well.

## 2023-10-30 NOTE — Assessment & Plan Note (Signed)
 As noted, if EF remains low, would refer to EP for consideration of CRT-D.

## 2023-10-30 NOTE — Assessment & Plan Note (Signed)
 She is on lifelong anticoagulation.

## 2023-10-30 NOTE — Progress Notes (Unsigned)
 Enrolled for [employer] to mail a ZIO AT Live Telemetry monitor to patients address on file.   Dr. Marlou Porch to read.

## 2023-10-30 NOTE — Assessment & Plan Note (Signed)
 LDL optimal. -Continue Crestor 20mg  daily.

## 2023-10-30 NOTE — Assessment & Plan Note (Signed)
 Non-ischemic cardiomyopathy. No CAD on cath in 2017. EF as low as 20-25 in 10/2021. She was followed in the [facility]F Clinic in the past and CPET in 2022 showed main ventilatory limitation due to obesity. She did have mild HF limitation. EF improved to 40 by TTE in 10/2022. Recent nuclear stress test with EF 25. She did have inferior infarct with peri-infarct ischemia. It was decided to hold off on cardiac catheterization at last visit in 07/2023. She notes recent worsening of dyspnea on exertion. She is NYHA III. Knee pain limits her mobility as well. Volume appears stable on physical exam. We discussed the rationale, benefits of CRT-D again today.  -Continue Carvedilol 6.25mg  twice daily, Entresto 24/26mg  twice daily, Furosemide 40mg  daily as needed, and Spironolactone 25mg  daily. -She is intol of SGLT2i 2/2 frequent UTIs -Order BNP and basic metabolic panel to assess fluid status and kidney function. -Order follow-up echocardiogram to re-assess ejection fraction. -If ejection fraction remains low, consider referral to electrophysiology for CRT-D discussion.

## 2023-10-30 NOTE — Assessment & Plan Note (Signed)
 Related to prior pulmonary embolism. -She remains on Xarelto 10mg  daily for anticoagulation.

## 2023-10-31 LAB — BASIC METABOLIC PANEL
BUN/Creatinine Ratio: 19 (ref 12–28)
BUN: 14 mg/dL (ref 8–27)
CO2: 28 mmol/L (ref 20–29)
Calcium: 9.3 mg/dL (ref 8.7–10.3)
Chloride: 101 mmol/L (ref 96–106)
Creatinine, Ser: 0.75 mg/dL (ref 0.57–1.00)
Glucose: 91 mg/dL (ref 70–99)
Potassium: 4.3 mmol/L (ref 3.5–5.2)
Sodium: 144 mmol/L (ref 134–144)
eGFR: 86 mL/min/{1.73_m2} (ref >59)

## 2023-10-31 LAB — PRO B NATRIURETIC PEPTIDE: NT-Pro BNP: 280 pg/mL (ref 0–301)

## 2023-11-03 DIAGNOSIS — M170 Bilateral primary osteoarthritis of knee: Secondary | ICD-10-CM | POA: Diagnosis not present

## 2023-11-09 ENCOUNTER — Other Ambulatory Visit: Payer: Self-pay | Admitting: *Deleted

## 2023-11-09 DIAGNOSIS — N200 Calculus of kidney: Secondary | ICD-10-CM

## 2023-11-10 ENCOUNTER — Ambulatory Visit
Admission: RE | Admit: 2023-11-10 | Discharge: 2023-11-10 | Disposition: A | Discharge status: Home / Self Care | Attending: Urology | Admitting: Urology

## 2023-11-10 ENCOUNTER — Ambulatory Visit: Admitting: Urology

## 2023-11-10 ENCOUNTER — Encounter: Payer: Self-pay | Admitting: Urology

## 2023-11-10 ENCOUNTER — Ambulatory Visit
Admission: RE | Admit: 2023-11-10 | Discharge: 2023-11-10 | Disposition: A | Discharge status: Home / Self Care | Source: Ambulatory Visit | Attending: Urology | Admitting: Urology

## 2023-11-10 VITALS — BP 113/74 | HR 87 | Temp 98.0°F | Ht 64.5 in

## 2023-11-10 DIAGNOSIS — R3915 Urgency of urination: Secondary | ICD-10-CM | POA: Diagnosis not present

## 2023-11-10 DIAGNOSIS — N200 Calculus of kidney: Secondary | ICD-10-CM

## 2023-11-10 DIAGNOSIS — R310 Gross hematuria: Secondary | ICD-10-CM

## 2023-11-10 DIAGNOSIS — N3001 Acute cystitis with hematuria: Secondary | ICD-10-CM

## 2023-11-10 LAB — MICROSCOPIC EXAMINATION: WBC, UA: >30 /HPF — AB (ref 0–5)

## 2023-11-10 LAB — URINALYSIS, COMPLETE
Bilirubin, UA: NEGATIVE
Glucose, UA: NEGATIVE
Ketones, UA: NEGATIVE
Nitrite, UA: NEGATIVE
Specific Gravity, UA: >1.03 — ABNORMAL HIGH (ref 1.005–1.030)
Urobilinogen, Ur: 0.2 mg/dL (ref 0.2–1.0)
pH, UA: 5.5 (ref 5.0–7.5)

## 2023-11-10 LAB — BLADDER SCAN AMB NON-IMAGING

## 2023-11-10 MED ORDER — FLUCONAZOLE 150 MG PO TABS
150.0000 mg | ORAL_TABLET | Freq: Once | ORAL | 1 refills | Status: AC
Start: 2023-11-10 — End: 2023-11-10

## 2023-11-10 MED ORDER — CEFUROXIME AXETIL 250 MG PO TABS: 250.0000 mg | ORAL_TABLET | Freq: Two times a day (BID) | ORAL | 0 refills | Status: AC

## 2023-11-10 NOTE — Progress Notes (Signed)
 In and Out Catheterization  Patient is present today for a I & O catheterization due to inability to void. Patient was cleaned and prepped in a sterile fashion with betadine . A 14FR cath was inserted no complications were noted , 15 ml of urine return was noted, urine was clear/cloudy in color. A clean urine sample was collected for UA/cx. Bladder was drained  And catheter was removed with out difficulty.    Performed by: Dorita Fray, CMA

## 2023-11-10 NOTE — Progress Notes (Signed)
 I, Maysun Anabel Bene, acting as a scribe for [name], MD., have documented all relevant documentation on the behalf of [name], MD, as directed by [name], MD while in the presence of [name], MD.  11/10/2023 2:50 PM   Patricia Fischer 09/17/1955 [id number]  Referring provider: Danelle Berry, PA-C [address] [place],  [place] [address]  Chief Complaint  Patient presents with   Hematuria    HPI: Patricia Fischer is a 68 y.o. female presents before evaluation of gross hematuria.  On 11/05/23 she had onset of increasing urinary urgency, bladder pressure, and pelvic/low back pain.  She states yesterday morning, she had onset of a fever gross hemauria which persisted for approximately 6 hours then resolved.  She has pelvic pressure and feels full but wasn't able to give a urine specimen on arrival. Patient was unable to void and a bladder scan was performed with an estimated urine volume of 23 mL. She was catheterized for urinalysis. At the time of catheterization there was no evidence of external vaginal or urethral bleeding. Her urine was grossly clear but cloudy.   PMH: Past Medical History:  Diagnosis Date   Allergy    Anxiety    Arthritis    Asthma    Blood transfusion 1979   Chidbirth   Breast cancer, left breast (HCC) 2011   DCIS   Chronic systolic CHF w/ Improved LVF 05/05/2015   NICM // Dx 04/2015 - EF initially 25-30% with RV dysfunction // f/u echo 08/2015 technically difficult, EF 35-40%, anterior, anteroseptal, apical and inferoapical severe hypokinesis suggestive of LAD territory ischemia/infarct, grade 1 DD, mild MR, RV normal. // Echo 5/18: mild LVH, EF 30-35, Gr 1 DD // cMRI 12/18: EF 52    COPD (chronic obstructive pulmonary disease) (HCC)    Depression    DVT (deep venous thrombosis) (HCC) 04/2015   a. Dx 04/2015 - patient returned 05/2015 after noncompliance with Xarelto.   Environmental allergies    Essential  hypertension    Family Hx of adverse reaction to anesthesia    " My Mother would get deathly sick "   Gastroesophageal reflux disease    Head injury 2011   Hemorrhoids    History of cardiac catheterization    Cath 10/2015: oLCx 50 (prob cath induced spasm), no sig CAD, EF 35-45   LBBB (left bundle branch block)    Migraine HAs 11/2014   Obesity    Orthostatic hypotension    Pneumonia 2012   Saddle pulmonary embolus (HCC) 04/2015   a. Dx 04/2015 - patient returned 05/2015 after noncompliance with Xarelto.   Shortness of breath dyspnea    Subscapularis (muscle) sprain 11/22/2013   Syncope    a. Recurrent syncope in 2016 felt 2/2 orthostasis in setting of PE.    Surgical History: Past Surgical History:  Procedure Laterality Date   ABDOMINAL HYSTERECTOMY  1986   ANAL FISSURE REPAIR     ANTERIOR CERVICAL DECOMP/DISCECTOMY FUSION  01/23/2012   Procedure: ANTERIOR CERVICAL DECOMPRESSION/DISCECTOMY FUSION 1 LEVEL;  Surgeon: Mariam Dollar, MD;  Location: [facility];  Service: Neurosurgery;  Laterality: N/A;  Cervical five-six anterior cervical discectomy fusion with plating   APPENDECTOMY     BREAST BIOPSY Left 06/2016   US biopsy-PAPILLARY CYSTIC APOCRINE CHANGE    BREAST SURGERY Left 2011   Breast lumpectomy   CARDIAC CATHETERIZATION  2010   [facility]; pt states it was "clear"  CARDIAC CATHETERIZATION N/A 10/28/2015   Procedure: Right/Left Heart Cath and Coronary Angiography;  Surgeon: Lyn Records, MD;  Location: [facility] INVASIVE CV LAB;  Service: Cardiovascular;  Laterality: N/A;   CARPAL TUNNEL RELEASE Bilateral    CESAREAN SECTION  1982, 1979   CHOLECYSTECTOMY     COLONOSCOPY  01/16/2009   Dr Lemar Livings   KNEE CARTILAGE SURGERY Left    SHOULDER ARTHROSCOPY WITH SUBACROMIAL DECOMPRESSION, ROTATOR CUFF REPAIR AND BICEP TENDON REPAIR Right 11/22/2013   Procedure: RIGHT SHOULDER ATHROSCOPY OPEN SUBSCAP REPAIR DELTA-PECTORAL ;  Surgeon: Verlee Rossetti, MD;  Location: [facility] OR;   Service: Orthopedics;  Laterality: Right;   TARSAL TUNNEL RELEASE Left 1990   TONSILLECTOMY     TUBAL LIGATION      Home Medications:  Allergies as of 11/10/2023       Reactions   Gadolinium Derivatives Shortness Of Breath, Nausea And Vomiting   Levaquin [levofloxacin In D5w] Itching   Was giving with vancomycin at same time - unsure which one pt had a reaction to   Sulfa Antibiotics Swelling   "Ears swelled up like Dumbo"   Vancomycin Itching   Was giving at same time as Levaquin - unsure which pt had reaction to   Aripiprazole Other (See Comments)   Dry mouth with sores   Levofloxacin Itching   Codeine Itching, Rash   Oxycodone Itching   Penicillins Hives   Has patient had a PCN reaction causing immediate rash, facial/tongue/throat swelling, SOB or lightheadedness with hypotension: Yes Has patient had a PCN reaction causing severe rash involving mucus membranes or skin necrosis: No Has patient had a PCN reaction that required hospitalization No Has patient had a PCN reaction occurring within the last 10 years: No If all of the above answers are "NO", then may proceed with Cephalosporin use.        Medication List        Accurate as of November 10, 2023  2:50 PM. If you have any questions, ask your nurse or doctor.          albuterol 108 (90 Base) MCG/ACT inhaler Commonly known as: VENTOLIN HFA TAKE 2 PUFFS BY MOUTH EVERY 6 HOURS AS NEEDED FOR WHEEZE OR SHORTNESS OF BREATH   ascorbic acid 500 MG tablet Commonly known as: VITAMIN C Take 500 mg by mouth daily.   carvedilol 6.25 MG tablet Commonly known as: COREG Take 1 tablet (6.25 mg total) by mouth 2 (two) times daily.   cefUROXime 250 MG tablet Commonly known as: CEFTIN Take 1 tablet (250 mg total) by mouth 2 (two) times daily with a meal for 7 days. Started by: [name]   cetirizine 10 MG tablet Commonly known as: ZYRTEC Take 10 mg by mouth daily.   fluconazole 150 MG tablet Commonly known as:  DIFLUCAN Take 1 tablet (150 mg total) by mouth once for 1 dose. Started by: [name]   fluticasone 50 MCG/ACT nasal spray Commonly known as: FLONASE SPRAY 2 SPRAYS INTO EACH NOSTRIL EVERY DAY   furosemide 40 MG tablet Commonly known as: LASIX Take 1 tablet (40 mg total) by mouth daily.   gabapentin 300 MG capsule Commonly known as: NEURONTIN Take 300 mg by mouth 3 (three) times daily as needed.   rivaroxaban 10 MG Tabs tablet Commonly known as: Xarelto Take 1 tablet (10 mg total) by mouth daily.   rosuvastatin 20 MG tablet Commonly known as: CRESTOR Take 1 tablet (20 mg total) by mouth daily.  sacubitril-valsartan 24-26 MG Commonly known as: ENTRESTO Take 1 tablet by mouth 2 (two) times daily.   spironolactone 25 MG tablet Commonly known as: ALDACTONE Take 1 tablet (25 mg total) by mouth daily.   tizanidine 2 MG capsule Commonly known as: ZANAFLEX Take 2 mg by mouth 3 (three) times daily as needed for muscle spasms.   traMADol 50 MG tablet Commonly known as: ULTRAM Take 50 mg by mouth every 4 (four) hours as needed.   Trelegy Ellipta 100-62.5-25 MCG/ACT Aepb Generic drug: Fluticasone-Umeclidin-Vilant INHALE 1 PUFF BY MOUTH EVERY DAY   vitamin B-12 500 MCG tablet Commonly known as: CYANOCOBALAMIN Take 2,500 mcg by mouth daily.        Allergies:  Allergies  Allergen Reactions   Gadolinium Derivatives Shortness Of Breath and Nausea And Vomiting   Levaquin [Levofloxacin In D5w] Itching    Was giving with vancomycin at same time - unsure which one pt had a reaction to   Sulfa Antibiotics Swelling    "Ears swelled up like Dumbo"   Vancomycin Itching    Was giving at same time as Levaquin - unsure which pt had reaction to   Aripiprazole Other (See Comments)    Dry mouth with sores    Levofloxacin Itching   Codeine Itching and Rash   Oxycodone Itching   Penicillins Hives    Has patient had a PCN reaction causing immediate rash, facial/tongue/throat  swelling, SOB or lightheadedness with hypotension: Yes Has patient had a PCN reaction causing severe rash involving mucus membranes or skin necrosis: No Has patient had a PCN reaction that required hospitalization No Has patient had a PCN reaction occurring within the last 10 years: No If all of the above answers are "NO", then may proceed with Cephalosporin use.    Family History: Family History  Problem Relation Age of Onset   Breast cancer Paternal Aunt    Breast cancer Mother 78   Migraines Mother    Cancer Mother    Hypertension Mother    Diabetes Mother    Rectal cancer Father    Cancer Father    COPD Father    Alcohol abuse Father    Heart disease Paternal Grandmother    Stroke Paternal Grandmother    Heart attack Paternal Grandmother    Aneurysm Paternal Grandfather    COPD Paternal Grandfather    Breast cancer Cousin    Breast cancer Cousin    Breast cancer Cousin    Diabetes Sister    Hearing loss Son    Anesthesia problems Neg Hx     Social History:  reports that she has never smoked. She has never used smokeless tobacco. She reports that she does not currently use alcohol. She reports that she does not use drugs.   Physical Exam: BP 113/74 (BP Location: Right Arm, Patient Position: Sitting, Cuff Size: Large)   Pulse 87   Temp 98 F (36.7 C) (Oral)   Ht 5' 4.5" (1.638 m)   BMI 40.63 kg/m   Constitutional:  Alert and oriented, No acute distress. HEENT: [place] AT Respiratory: Normal respiratory effort, no increased work of breathing. Psychiatric: Normal mood and affect.  Urinalysis Microscopy >30 WBCs/3-10 RBC.   Assessment & Plan:    1. Acute cystitis Urine culture ordered Start cefuroxime 250 mg twice daily x7 days (PCN allergy but has taken in the past w/o problems).   2. Gross hematuria Secondary to above; recent negative cystoscopy.  Repeat urinalysis 2 weeks.  I have reviewed  the above documentation for accuracy and completeness, and I agree  with the above.   [name], MD  [facility] [address] [place], [place] [address] [phone]

## 2023-11-10 NOTE — Patient Instructions (Signed)

## 2023-11-14 LAB — CULTURE, URINE COMPREHENSIVE

## 2023-11-15 ENCOUNTER — Encounter: Payer: Self-pay | Admitting: Urology

## 2023-11-23 ENCOUNTER — Ambulatory Visit (HOSPITAL_COMMUNITY): Payer: Medicare Other | Attending: Cardiovascular Disease

## 2023-11-23 DIAGNOSIS — R55 Syncope and collapse: Secondary | ICD-10-CM | POA: Diagnosis not present

## 2023-11-23 DIAGNOSIS — I5020 Unspecified systolic (congestive) heart failure: Secondary | ICD-10-CM | POA: Insufficient documentation

## 2023-11-23 LAB — ECHOCARDIOGRAM COMPLETE
Area-P 1/2: 4.26 cm2
S' Lateral: 3.7 cm

## 2023-11-24 ENCOUNTER — Other Ambulatory Visit

## 2023-11-24 ENCOUNTER — Other Ambulatory Visit: Payer: Self-pay

## 2023-11-24 DIAGNOSIS — R310 Gross hematuria: Secondary | ICD-10-CM

## 2023-11-24 DIAGNOSIS — I5020 Unspecified systolic (congestive) heart failure: Secondary | ICD-10-CM

## 2023-11-24 LAB — URINALYSIS, COMPLETE
Bilirubin, UA: NEGATIVE
Glucose, UA: NEGATIVE
Ketones, UA: NEGATIVE
Leukocytes,UA: NEGATIVE
Nitrite, UA: NEGATIVE
RBC, UA: NEGATIVE
Specific Gravity, UA: 1.02 (ref 1.005–1.030)
Urobilinogen, Ur: 0.2 mg/dL (ref 0.2–1.0)
pH, UA: 7 (ref 5.0–7.5)

## 2023-11-24 LAB — MICROSCOPIC EXAMINATION: RBC, Urine: NONE SEEN /HPF (ref 0–2)

## 2023-11-27 ENCOUNTER — Encounter: Payer: Self-pay | Admitting: *Deleted

## 2023-12-08 DIAGNOSIS — R55 Syncope and collapse: Secondary | ICD-10-CM

## 2023-12-24 ENCOUNTER — Other Ambulatory Visit: Payer: Self-pay | Admitting: Family Medicine

## 2023-12-24 DIAGNOSIS — J441 Chronic obstructive pulmonary disease with (acute) exacerbation: Secondary | ICD-10-CM

## 2023-12-25 NOTE — Telephone Encounter (Signed)
 Requested Prescriptions  Pending Prescriptions Disp Refills   albuterol  (VENTOLIN  HFA) 108 (90 Base) MCG/ACT inhaler [Pharmacy Med Name: ALBUTEROL  HFA (PROAIR ) INHALER] 8.5 each 2    Sig: TAKE 2 PUFFS BY MOUTH EVERY 6 HOURS AS NEEDED FOR WHEEZE OR SHORTNESS OF BREATH     Pulmonology:  Beta Agonists 2 Failed - 12/25/2023  1:39 PM      Failed - Valid encounter within last 12 months    Recent Outpatient Visits   None     Future Appointments             In 4 weeks Gabino Joe, PA-C [facility]            Passed - Last BP in normal range    BP Readings from Last 1 Encounters:  11/10/23 113/74         Passed - Last Heart Rate in normal range    Pulse Readings from Last 1 Encounters:  11/10/23 87

## 2023-12-28 ENCOUNTER — Ambulatory Visit: Attending: Cardiology | Admitting: Cardiology

## 2023-12-28 ENCOUNTER — Encounter: Payer: Self-pay | Admitting: Cardiology

## 2023-12-28 VITALS — BP 118/82 | HR 75 | Ht 64.5 in | Wt 237.0 lb

## 2023-12-28 DIAGNOSIS — I5022 Chronic systolic (congestive) heart failure: Secondary | ICD-10-CM | POA: Diagnosis not present

## 2023-12-28 DIAGNOSIS — I454 Nonspecific intraventricular block: Secondary | ICD-10-CM | POA: Diagnosis not present

## 2023-12-28 DIAGNOSIS — Z86711 Personal history of pulmonary embolism: Secondary | ICD-10-CM

## 2023-12-28 NOTE — Patient Instructions (Signed)
 Medication Instructions:  Your physician recommends that you continue on your current medications as directed. Please refer to the Current Medication list given to you today.  *If you need a refill on your cardiac medications before your next appointment, please call your pharmacy*   Testing/Procedures: CRT (Cardiac Resynchronization Therapy)  Follow-Up: At [facility], you and your health needs are our priority.  As part of our continuing mission to provide you with exceptional heart care, our providers are all part of one team.  This team includes your primary Cardiologist (physician) and Advanced Practice Providers or APPs (Physician Assistants and Nurse Practitioners) who all work together to provide you with the care you need, when you need it.  Your next appointment:   Call us  back if you decided that you would like to schedule the procedure      1st Floor: - Lobby - Registration  - Pharmacy  - Lab - Cafe  2nd Floor: - PV Lab - Diagnostic Testing (echo, CT, nuclear med)  3rd Floor: - Vacant  4th Floor: - TCTS (cardiothoracic surgery) - AFib Clinic - Structural Heart Clinic - Vascular Surgery  - Vascular Ultrasound  5th Floor: - HeartCare Cardiology (general and EP) - Clinical Pharmacy for coumadin, hypertension, lipid, weight-loss medications, and med management appointments    Valet parking services will be available as well.

## 2023-12-28 NOTE — Progress Notes (Unsigned)
 Electrophysiology Office Note:   Date:  12/29/2023  ID:  Patricia Fischer, DOB June 29, 1956, MRN [id number]  Primary Cardiologist: Dorothye Gathers, MD Electrophysiologist: Ardeen Kohler, MD      History of Present Illness:   Patricia Fischer is a 68 y.o. female with h/o chronic systolic heart failure, left bundle branch block, pulmonary embolus who is being seen today for evaluation for CRT-D.  Discussed the use of AI scribe software for clinical note transcription with the patient, who gave verbal consent to proceed.  History of Present Illness She experiences symptoms of shortness of breath, which she attributes to her heart's decreased function. She has difficulty breathing when moving around and sometimes while lying flat, requiring her to sleep propped up with pillows. She also notes fatigue and difficulty with physical activities such as walking up steps. Her EKG from 2019 showed normal QRS duration, but by 2022, there was an IVCD pattern on her EKG. This change seems to have coincided with a decrease in her LVEF. She is currently on a regimen of GDMT, which she endorses compliance with.  Review of systems complete and found to be negative unless listed in HPI.   EP Information / Studies Reviewed:    EKG is not ordered today. EKG from 07/31/23 reviewed which showed sinus rhythm with IVCD, QRS .      Zio 12/2023:    Sinus rhythm with bundle branch block.  Average heart rate 78 bpm   Brief nonsustained ventricular tachycardia, 7 beats duration-no symptoms   Rare PACs, occasional PVCs, 2.1%.  On monitor 2, 5% PVCs were noted.  Still occasional.   No atrial fibrillation, no pauses  Echo 11/23/23:   1. Septal-lateral dyssynchrony. Left ventricular ejection fraction, by  estimation, is 25 to 30%. The left ventricle has severely decreased  function. The left ventricle demonstrates regional wall motion  abnormalities (see scoring diagram/findings for  description). Left ventricular  diastolic parameters are consistent with  Grade I diastolic dysfunction (impaired relaxation).   2. Right ventricular systolic function is normal. The right ventricular  size is normal. There is normal pulmonary artery systolic pressure.   3. The mitral valve is normal in structure. Mild mitral valve  regurgitation. No evidence of mitral stenosis.   4. The aortic valve is tricuspid. Aortic valve regurgitation is not  visualized. No aortic stenosis is present.   5. The inferior vena cava is normal in size with greater than 50%  respiratory variability, suggesting right atrial pressure of 3 mmHg.   Nuclear Stress 07/19/23:    Findings are consistent with inferior and apical infarction with apical peri-infarct ischemia. The study is intermediate risk due to reduced EF and primarily fixed perfusion defect.   Baseline ECG rhythm shows normal sinus rhythm. ECG demonstrates left bundle branch block.   No ST deviation was noted. Arrhythmias during stress: occasional PVCs. Arrhythmias during recovery: occasional PVCs.   LV perfusion is abnormal. There is evidence of ischemia. There is evidence of infarction. Defect 1: There is a large defect with moderate reduction in uptake present in the apical to basal anteroseptal, inferior, inferoseptal and apex location(s) that is partially reversible. There is abnormal wall motion in the defect area. Consistent with infarction and peri-infarct ischemia.   Left ventricular function is abnormal. Global function is severely reduced. Nuclear stress EF: 25%. The left ventricular ejection fraction is severely decreased (<30%). End diastolic cavity size is mildly enlarged. End systolic cavity size is mildly enlarged. No evidence of transient ischemic dilation (  TID) noted.   Prior study not available for comparison.   Physical Exam:   VS:  BP 118/82   Pulse 75   Ht 5' 4.5" (1.638 m)   Wt 237 lb (107.5 kg)   SpO2 94%   BMI 40.05 kg/m    Wt Readings from Last 3  Encounters:  12/28/23 237 lb (107.5 kg)  10/30/23 240 lb 6.4 oz (109 kg)  07/31/23 238 lb (108 kg)     GEN: Well nourished, well developed in no acute distress NECK: No JVD CARDIAC: Normal rate, regular RESPIRATORY:  Clear to auscultation without rales, wheezing or rhonchi  ABDOMEN: Soft, non-distended EXTREMITIES:  Trace edema; No deformity   ASSESSMENT AND PLAN:    #. Chronic systolic heart failure: NYHA class II/III symptoms. LVEF 25-30%.  #. IVCD: Left bundle like pattern in septal precordial leads, but negative in V6, I and aVL. QRS duration . - Patient has LVEF less than 35% despite greater than 90 days of guideline directed medical therapy.  She meets criteria for primary prevention ICD.  Given that she has low LVEF and wide QRS, IVCD/left bundle pattern, would recommend CRT-D if she chooses to pursue defibrillator implant.  I also think CRT-P would be appropriate, if defibrillator was not within her goals of care, based on low LVEF despite medications, NYHA class II/III symptoms and wide QRS.  We extensively discussed risk and potential benefits of CRT therapy, patient would like to think about her options and will let us  know how she would like to proceed. - Continue GDMT regimen of carvedilol  6.25 mg twice daily, Entresto  24-26 mg twice daily, spironolactone  25 mg daily.   #. History of pulmonary embolus:  - Continue Xarelto  10 mg once daily.  Will need to hold if she decides to pursue device implant.  Will try to minimize time off anticoagulation.  Follow up with Dr. Daneil Dunker in 4 months.   Total time of encounter: 63 minutes total time of encounter, including chart review, face-to-face patient care, coordination of care and counseling regarding high complexity medical decision making.  Signed, Ardeen Kohler, MD

## 2024-01-03 ENCOUNTER — Ambulatory Visit: Admitting: Family Medicine

## 2024-01-03 ENCOUNTER — Encounter: Payer: Self-pay | Admitting: Family Medicine

## 2024-01-03 VITALS — BP 134/82 | HR 110 | Temp 99.2°F | Resp 20 | Ht 64.5 in | Wt 231.7 lb

## 2024-01-03 DIAGNOSIS — E66813 Obesity, class 3: Secondary | ICD-10-CM

## 2024-01-03 DIAGNOSIS — F331 Major depressive disorder, recurrent, moderate: Secondary | ICD-10-CM | POA: Diagnosis not present

## 2024-01-03 DIAGNOSIS — J069 Acute upper respiratory infection, unspecified: Secondary | ICD-10-CM

## 2024-01-03 DIAGNOSIS — J22 Unspecified acute lower respiratory infection: Secondary | ICD-10-CM

## 2024-01-03 DIAGNOSIS — J441 Chronic obstructive pulmonary disease with (acute) exacerbation: Secondary | ICD-10-CM | POA: Diagnosis not present

## 2024-01-03 DIAGNOSIS — Z6841 Body Mass Index (BMI) 40.0 and over, adult: Secondary | ICD-10-CM

## 2024-01-03 DIAGNOSIS — E0849 Diabetes mellitus due to underlying condition with other diabetic neurological complication: Secondary | ICD-10-CM | POA: Insufficient documentation

## 2024-01-03 LAB — POC COVID19/FLU A&B COMBO
Covid Antigen, POC: NEGATIVE
Influenza A Antigen, POC: NEGATIVE
Influenza B Antigen, POC: NEGATIVE

## 2024-01-03 MED ORDER — NEBULIZER/TUBING/MOUTHPIECE KIT: PACK | 0 refills | Status: DC

## 2024-01-03 MED ORDER — HYDROCOD POLI-CHLORPHE POLI ER 10-8 MG/5ML PO SUER: 5.0000 mL | Freq: Every evening | ORAL | 0 refills | Status: DC | PRN

## 2024-01-03 MED ORDER — IPRATROPIUM-ALBUTEROL 0.5-2.5 (3) MG/3ML IN SOLN
3.0000 mL | Freq: Four times a day (QID) | RESPIRATORY_TRACT | 1 refills | Status: DC | PRN
Start: 2024-01-03 — End: 2024-01-23

## 2024-01-03 MED ORDER — PREDNISONE 20 MG PO TABS: ORAL_TABLET | ORAL | 0 refills | Status: DC

## 2024-01-03 MED ORDER — ALBUTEROL SULFATE (2.5 MG/3ML) 0.083% IN NEBU
2.5000 mg | INHALATION_SOLUTION | Freq: Once | RESPIRATORY_TRACT | Status: AC
Start: 2024-01-03 — End: ?

## 2024-01-03 NOTE — Progress Notes (Signed)
 Patient ID: Patricia Fischer, female    DOB: April 03, 1956, 68 y.o.   MRN: [id number]  PCP: Adeline Hone, PA-C  Chief Complaint  Patient presents with   URI    Bad cough, headache and body aches.  Pt thinks its bronchitis    Subjective:   Patricia Fischer is a 68 y.o. female, presents to clinic with CC of the following:  HPI  Sick since Monday (2 d ago) URI sx, cough, body aches, hot/cold, coughing and SOB more COPD, new heart issues/rhythm working with EP/Dr. Renna Cary, she hasn't been aroudn anyone sick  Patient Active Problem List   Diagnosis Date Noted   Positive colorectal cancer screening using Cologuard test 03/16/2023   Pain of right shoulder joint on movement 08/04/2022   Osteoarthritis of knee 03/17/2022   Claustrophobia 02/09/2022   Pain in joint of left knee 12/21/2021   Derangement of left knee 12/21/2021   Chronic obstructive pulmonary disease (HCC) 01/22/2020   Stress incontinence 12/27/2019   Hyperlipidemia 12/27/2019   Muscle spasms of lower extremity 12/27/2019   Class 3 severe obesity with body mass index (BMI) of 40.0 to 44.9 in adult [facility]) 07/12/2017   Allergic rhinitis 06/26/2017   Insomnia 06/26/2017   Hematuria 07/27/2016   Degenerative joint disease (DJD) of hip 03/31/2016   Hx of breast cancer 03/22/2016   History of pulmonary embolism 10/27/2015   HTN (hypertension) 10/07/2015   Depression, major, recurrent, moderate (HCC) 06/11/2015   Anemia 05/15/2015   Do not resuscitate status 05/15/2015   HFrEF (heart failure with reduced ejection fraction) (HCC) 05/05/2015   Left leg DVT (HCC) 05/05/2015   Pulmonary hypertension (HCC)    Saddle pulmonary embolus (HCC)    LBBB (left bundle branch block)-rate related  04/19/2015   Chronic migraine without aura, with intractable migraine, so stated, with status migrainosus 11/04/2014      Current Outpatient Medications:    albuterol  (VENTOLIN  HFA) 108 (90 Base) MCG/ACT inhaler, TAKE 2 PUFFS BY MOUTH  EVERY 6 HOURS AS NEEDED FOR WHEEZE OR SHORTNESS OF BREATH, Disp: 8.5 each, Rfl: 2   carvedilol  (COREG ) 6.25 MG tablet, Take 1 tablet (6.25 mg total) by mouth 2 (two) times daily., Disp: 180 tablet, Rfl: 3   cetirizine (ZYRTEC) 10 MG tablet, Take 10 mg by mouth daily., Disp: , Rfl:    fluticasone  (FLONASE ) 50 MCG/ACT nasal spray, SPRAY 2 SPRAYS INTO EACH NOSTRIL EVERY DAY, Disp: 48 mL, Rfl: 1   Fluticasone -Umeclidin-Vilant (TRELEGY ELLIPTA ) 100-62.5-25 MCG/ACT AEPB, INHALE 1 PUFF BY MOUTH EVERY DAY, Disp: 60 each, Rfl: 1   furosemide  (LASIX ) 40 MG tablet, Take 1 tablet (40 mg total) by mouth daily. (Patient taking differently: Take 40 mg by mouth as needed for fluid.), Disp: 90 tablet, Rfl: 3   gabapentin  (NEURONTIN ) 300 MG capsule, Take 300 mg by mouth 3 (three) times daily as needed., Disp: , Rfl:    rivaroxaban  (XARELTO ) 10 MG TABS tablet, Take 1 tablet (10 mg total) by mouth daily., Disp: 90 tablet, Rfl: 3   rosuvastatin  (CRESTOR ) 20 MG tablet, Take 1 tablet (20 mg total) by mouth daily., Disp: 90 tablet, Rfl: 3   sacubitril -valsartan  (ENTRESTO ) 24-26 MG, Take 1 tablet by mouth 2 (two) times daily., Disp: 180 tablet, Rfl: 3   spironolactone  (ALDACTONE ) 25 MG tablet, Take 1 tablet (25 mg total) by mouth daily., Disp: 90 tablet, Rfl: 3   tizanidine  (ZANAFLEX ) 2 MG capsule, Take 2 mg by mouth 3 (three) times daily as needed for  muscle spasms., Disp: , Rfl:    traMADol  (ULTRAM ) 50 MG tablet, Take 50 mg by mouth every 4 (four) hours as needed., Disp: , Rfl:    vitamin B-12 (CYANOCOBALAMIN ) 500 MCG tablet, Take 2,500 mcg by mouth daily., Disp: , Rfl:    vitamin C (ASCORBIC ACID) 500 MG tablet, Take 500 mg by mouth daily., Disp: , Rfl:    Allergies  Allergen Reactions   Gadolinium Derivatives Shortness Of Breath and Nausea And Vomiting   Levaquin  [Levofloxacin  In D5w] Itching    Was giving with vancomycin  at same time - unsure which one pt had a reaction to   Sulfa Antibiotics Swelling    "Ears  swelled up like Dumbo"   Vancomycin  Itching    Was giving at same time as Levaquin  - unsure which pt had reaction to   Aripiprazole  Other (See Comments)    Dry mouth with sores    Levofloxacin  Itching   Codeine Itching and Rash   Oxycodone  Itching   Penicillins Hives    Has patient had a PCN reaction causing immediate rash, facial/tongue/throat swelling, SOB or lightheadedness with hypotension: Yes Has patient had a PCN reaction causing severe rash involving mucus membranes or skin necrosis: No Has patient had a PCN reaction that required hospitalization No Has patient had a PCN reaction occurring within the last 10 years: No If all of the above answers are "NO", then may proceed with Cephalosporin use.     Social History   Tobacco Use   Smoking status: Never   Smokeless tobacco: Never  Vaping Use   Vaping status: Never Used  Substance Use Topics   Alcohol use: Not Currently    Comment: occasional wine   Drug use: Never      Chart Review Today: I personally reviewed active problem list, medication list, allergies, family history, social history, health maintenance, notes from last encounter, lab results, imaging with the patient/caregiver today.   Review of Systems  Constitutional: Negative.   HENT: Negative.    Eyes: Negative.   Respiratory: Negative.    Cardiovascular: Negative.   Gastrointestinal: Negative.   Endocrine: Negative.   Genitourinary: Negative.   Musculoskeletal: Negative.   Skin: Negative.   Allergic/Immunologic: Negative.   Neurological: Negative.   Hematological: Negative.   Psychiatric/Behavioral: Negative.    All other systems reviewed and are negative.      Objective:   Vitals:   01/03/24 1118  BP: 134/82  Pulse: (!) 110  Resp: 20  Temp: 99.2 F (37.3 C)  SpO2: 93%  Weight: 231 lb 11.2 oz (105.1 kg)  Height: 5' 4.5" (1.638 m)    Body mass index is 39.16 kg/m.  Physical Exam Vitals and nursing note reviewed.  Constitutional:       Appearance: She is well-developed. She is obese. She is not toxic-appearing or diaphoretic.     Comments: Appears tired and congested, NAD, non-toxic, no respiratory distress  HENT:     Head: Normocephalic and atraumatic.     Right Ear: External ear normal.     Left Ear: External ear normal.     Nose: Congestion and rhinorrhea present.     Mouth/Throat:     Mouth: Mucous membranes are moist.     Pharynx: Oropharynx is clear. No oropharyngeal exudate or posterior oropharyngeal erythema.  Eyes:     General: No scleral icterus.       Right eye: No discharge.        Left eye: No discharge.  Conjunctiva/sclera: Conjunctivae normal.  Neck:     Trachea: No tracheal deviation.  Cardiovascular:     Rate and Rhythm: Normal rate and regular rhythm.  Pulmonary:     Effort: Pulmonary effort is normal. No tachypnea, accessory muscle usage, respiratory distress or retractions.     Breath sounds: Decreased air movement present. No stridor. Examination of the right-lower field reveals decreased breath sounds. Decreased breath sounds, wheezing and rhonchi present.  Musculoskeletal:        General: Normal range of motion.  Skin:    General: Skin is warm and dry.     Findings: No rash.  Neurological:     Mental Status: She is alert.     Motor: No abnormal muscle tone.     Coordination: Coordination normal.  Psychiatric:        Behavior: Behavior normal.      Results for orders placed or performed in visit on 11/24/23  Microscopic Examination   Collection Time: 11/24/23 12:49 PM   Urine  Result Value Ref Range   WBC, UA 0-5 0 - 5 /hpf   RBC, Urine None seen 0 - 2 /hpf   Epithelial Cells (non renal) 0-10 0 - 10 /hpf   Casts Present (A) None seen /lpf   Cast Type Hyaline casts N/A   Bacteria, UA Few None seen/Few  Urinalysis, Complete   Collection Time: 11/24/23 12:49 PM  Result Value Ref Range   Specific Gravity, UA 1.020 1.005 - 1.030   pH, UA 7.0 5.0 - 7.5   Color, UA Yellow  Yellow   Appearance Ur Clear Clear   Leukocytes,UA Negative Negative   Protein,UA 1+ (A) Negative/Trace   Glucose, UA Negative Negative   Ketones, UA Negative Negative   RBC, UA Negative Negative   Bilirubin, UA Negative Negative   Urobilinogen, Ur 0.2 0.2 - 1.0 mg/dL   Nitrite, UA Negative Negative   Microscopic Examination See below:        Assessment & Plan:   1. Lower respiratory infection (Primary) Onset of viral URI sx 2 d ago, generalized sx, nasal discharge, more coughing SOB, flu/covid test here negative, no sick contacts For viral illness recommended other supportive measures - sleep, push fluids, OTC meds as able - DG Chest 2 View - POC Covid19/Flu A&B Antigen  2. Chronic obstructive pulmonary disease with acute exacerbation (HCC) Very wheezy and tight Steroids - pt usually needs more than 5 d Recommend good compliance and use of home inhalers, we will try to get her neb machine to do nebs at home - DG Chest 2 View - ipratropium-albuterol  (DUONEB) 0.5-2.5 (3) MG/3ML SOLN; Take 3 mLs by nebulization every 6 (six) hours as needed.  Dispense: 180 mL; Refill: 1 - POC Covid19/Flu A&B Antigen - chlorpheniramine-HYDROcodone  (TUSSIONEX) 10-8 MG/5ML; Take 5 mLs by mouth at bedtime as needed for cough.  Dispense: 120 mL; Refill: 0 - albuterol  (PROVENTIL ) (2.5 MG/3ML) 0.083% nebulizer solution 2.5 mg  3. Viral URI See #1  4. Depression, major, recurrent, moderate (HCC)    01/03/2024   11:19 AM 06/26/2023    1:55 PM 03/16/2023   10:20 AM  Depression screen PHQ 2/9  Decreased Interest 0 0 2  Down, Depressed, Hopeless 0 0 2  PHQ - 2 Score 0 0 4  Altered sleeping 0 0 2  Tired, decreased energy 0 0 2  Change in appetite 0 0 0  Feeling bad or failure about yourself  0 0 0  Trouble concentrating 0 0  2  Moving slowly or fidgety/restless 0 0 0  Suicidal thoughts 0 0 0  PHQ-9 Score 0 0 10  Difficult doing work/chores Not difficult at all Not difficult at all Somewhat  difficult  Not currently on meds, doing well, phq 9 reviewed and neg today and last OV, very situational   5. Class 3 severe obesity due to excess calories with serious comorbidity and body mass index (BMI) of 40.0 to 44.9 in adult [facility]) Multiple comorbidities HTN, HLD, neuopathy, MDD, pulm HTN, CHF, oa Weight down a little over the past ~6 months Wt Readings from Last 5 Encounters:  01/03/24 231 lb 11.2 oz (105.1 kg)  12/28/23 237 lb (107.5 kg)  10/30/23 240 lb 6.4 oz (109 kg)  07/31/23 238 lb (108 kg)  07/17/23 235 lb (106.6 kg)   BMI Readings from Last 5 Encounters:  01/03/24 39.16 kg/m  12/28/23 40.05 kg/m  11/10/23 40.63 kg/m  10/30/23 40.63 kg/m  07/31/23 40.85 kg/m     Pt given a neb and then reevaluated in clinic Lower lung sounds improved some but insp/exp wheeze throughout still persisted No increased WOB at rest or with walking Strongly encouraged her to do CXR but she declines and wants to go home and get in bed She will do it tomorrow if still not feeling any better CXR would be helpful to eval/ruleout CAP, pulm edema (though this does seem to be acutely infectious/viral) For COPD feel neb at home is extremely appropriate and order will be sent to medical supply store to arrange - she has rx for neb meds and took tubing from today with her to use at home if needed        Adeline Hone, PA-C 01/03/24 11:38 AM

## 2024-01-03 NOTE — Addendum Note (Signed)
 Addended by: Jawara Latorre on: 01/03/2024 12:15 PM   Modules accepted: Level of Service

## 2024-01-04 ENCOUNTER — Telehealth: Payer: Self-pay

## 2024-01-04 NOTE — Telephone Encounter (Signed)
 Copied from CRM [id number]. Topic: General - Other >> Jan 04, 2024 10:02 AM Hassie Lint wrote: Reason for CRM: Samantha from [employer] called requesting to have the patients demographics faxed over to their company. States she faxed a request to office for a nebulizer but needs the information to complete the request.  Ova Bloomer can be reached at [phone] Fax: [phone]

## 2024-01-04 NOTE — Telephone Encounter (Signed)
 Demographics faxed.

## 2024-01-08 ENCOUNTER — Encounter: Payer: Self-pay | Admitting: Cardiology

## 2024-01-08 DIAGNOSIS — I447 Left bundle-branch block, unspecified: Secondary | ICD-10-CM

## 2024-01-08 DIAGNOSIS — I5022 Chronic systolic (congestive) heart failure: Secondary | ICD-10-CM

## 2024-01-08 NOTE — Telephone Encounter (Signed)
 Spoke with the patient who states that she would like to proceed with CRT-D implant. Patient will be scheduled for June 16th. She is going to talk with her daughter this evening to confirm the date and let me know.

## 2024-01-09 ENCOUNTER — Encounter: Payer: Self-pay | Admitting: Family Medicine

## 2024-01-09 ENCOUNTER — Ambulatory Visit
Admission: RE | Admit: 2024-01-09 | Discharge: 2024-01-09 | Disposition: A | Discharge status: Home / Self Care | Attending: Family Medicine | Admitting: Family Medicine

## 2024-01-09 ENCOUNTER — Ambulatory Visit: Payer: Self-pay

## 2024-01-09 ENCOUNTER — Ambulatory Visit
Admission: RE | Admit: 2024-01-09 | Discharge: 2024-01-09 | Disposition: A | Discharge status: Home / Self Care | Source: Ambulatory Visit | Attending: Family Medicine | Admitting: Family Medicine

## 2024-01-09 DIAGNOSIS — J22 Unspecified acute lower respiratory infection: Secondary | ICD-10-CM | POA: Insufficient documentation

## 2024-01-09 DIAGNOSIS — J441 Chronic obstructive pulmonary disease with (acute) exacerbation: Secondary | ICD-10-CM | POA: Insufficient documentation

## 2024-01-09 NOTE — Telephone Encounter (Signed)
 Patient called in stating she was offered Chest X Ray at appt but declined due to lack of energy, however; she wants to move forward with CXR asap now. Patient states she doesn't have energy, lack of appetite, cough, and might have fever due to sweating yesterday. Patient states she has been taking prednisone  but doesn't believe she was given the full amount of pills as needed. Patient states she only has two days left (today and tomorrow). Please contact patient in regard to getting CXR (patient states she can do it today after 12 pm). Please contact patient if she needs to come back in for appt or orders for CXR. [phone]  Copied from CRM [id number]. Topic: Clinical - Medical Advice >> Jan 09, 2024  9:05 AM Crispin Dolphin wrote: Reason for CRM: Patient called. Was seen last week but was too weak to have x-ray. Would like to see if she can have the x-ray now. Is not feeling any better. Would also like to see if provider can send more predniSONE  (DELTASONE ) 20 MG tablet or if she wants her to come back in. Thank You Reason for Disposition  [1] Follow-up call from patient regarding patient's clinical status AND [2] information NON-URGENT  Answer Assessment - Initial Assessment Questions 1. REASON FOR CALL or QUESTION: "What is your reason for calling today?" or "How can I best help you?" or "What question do you have that I can help answer?"     Please see notes 2. CALLER: Document the source of call. (e.g., laboratory, patient).     Patient  Protocols used: PCP Call - No Triage-A-AH

## 2024-01-09 NOTE — Telephone Encounter (Signed)
 Spoke with pt and appointment scheduled for 5.7.25 at 11:20 with Leisa

## 2024-01-09 NOTE — Telephone Encounter (Signed)
 Pt notified, already ordered

## 2024-01-10 ENCOUNTER — Encounter: Payer: Self-pay | Admitting: Family Medicine

## 2024-01-10 ENCOUNTER — Ambulatory Visit (INDEPENDENT_AMBULATORY_CARE_PROVIDER_SITE_OTHER): Admitting: Family Medicine

## 2024-01-10 VITALS — BP 122/78 | HR 99 | Resp 16 | Ht 64.0 in | Wt 232.0 lb

## 2024-01-10 DIAGNOSIS — J441 Chronic obstructive pulmonary disease with (acute) exacerbation: Secondary | ICD-10-CM

## 2024-01-10 DIAGNOSIS — J22 Unspecified acute lower respiratory infection: Secondary | ICD-10-CM | POA: Diagnosis not present

## 2024-01-10 DIAGNOSIS — T3695XA Adverse effect of unspecified systemic antibiotic, initial encounter: Secondary | ICD-10-CM | POA: Diagnosis not present

## 2024-01-10 DIAGNOSIS — B379 Candidiasis, unspecified: Secondary | ICD-10-CM | POA: Diagnosis not present

## 2024-01-10 MED ORDER — TRELEGY ELLIPTA 100-62.5-25 MCG/ACT IN AEPB: 1.0000 | INHALATION_SPRAY | Freq: Every day | RESPIRATORY_TRACT | 5 refills | Status: DC

## 2024-01-10 MED ORDER — FLUCONAZOLE 150 MG PO TABS: 150.0000 mg | ORAL_TABLET | ORAL | 1 refills | Status: DC | PRN

## 2024-01-10 MED ORDER — PREDNISONE 20 MG PO TABS: ORAL_TABLET | ORAL | 0 refills | Status: DC

## 2024-01-10 MED ORDER — FAMOTIDINE 20 MG PO TABS: 20.0000 mg | ORAL_TABLET | Freq: Two times a day (BID) | ORAL | 1 refills | Status: DC

## 2024-01-10 MED ORDER — DOXYCYCLINE HYCLATE 100 MG PO TABS: 100.0000 mg | ORAL_TABLET | Freq: Two times a day (BID) | ORAL | 0 refills | Status: AC

## 2024-01-10 NOTE — Progress Notes (Signed)
 Patient ID: Coletta Davidson, female    DOB: 05-Sep-1956, 68 y.o.   MRN: [id number]  PCP: Adeline Hone, PA-C  Chief Complaint  Patient presents with   Follow-up    Doesn't feel better. Just got call today about getting machine, but since it's been a week she declined. Using daughters.   Cough    Non-productive    Subjective:   KASHAUNA SWANICK is a 68 y.o. female, presents to clinic with CC of the following:  HPI  F/up on COPD exacerbation, she did steroids, cough meds, nebs She is still very fatigued, coughing, wheezy Cough is non productive She did the CXR yesterday She feels a little better than last week  Patient Active Problem List   Diagnosis Date Noted   Positive colorectal cancer screening using Cologuard test 03/16/2023   Pain of right shoulder joint on movement 08/04/2022   Osteoarthritis of knee 03/17/2022   Claustrophobia 02/09/2022   Pain in joint of left knee 12/21/2021   Derangement of left knee 12/21/2021   Chronic obstructive pulmonary disease (HCC) 01/22/2020   Stress incontinence 12/27/2019   Hyperlipidemia 12/27/2019   Muscle spasms of lower extremity 12/27/2019   Class 3 severe obesity with body mass index (BMI) of 40.0 to 44.9 in adult 07/12/2017   Allergic rhinitis 06/26/2017   Insomnia 06/26/2017   Hematuria 07/27/2016   Degenerative joint disease (DJD) of hip 03/31/2016   Hx of breast cancer 03/22/2016   History of pulmonary embolism 10/27/2015   HTN (hypertension) 10/07/2015   Depression, major, recurrent, moderate (HCC) 06/11/2015   Anemia 05/15/2015   Do not resuscitate status 05/15/2015   HFrEF (heart failure with reduced ejection fraction) (HCC) 05/05/2015   Left leg DVT (HCC) 05/05/2015   Pulmonary hypertension (HCC)    Saddle pulmonary embolus (HCC)    LBBB (left bundle branch block)-rate related  04/19/2015   Chronic migraine without aura, with intractable migraine, so stated, with status migrainosus 11/04/2014       Current Outpatient Medications:    albuterol  (VENTOLIN  HFA) 108 (90 Base) MCG/ACT inhaler, TAKE 2 PUFFS BY MOUTH EVERY 6 HOURS AS NEEDED FOR WHEEZE OR SHORTNESS OF BREATH, Disp: 8.5 each, Rfl: 2   carvedilol  (COREG ) 6.25 MG tablet, Take 1 tablet (6.25 mg total) by mouth 2 (two) times daily., Disp: 180 tablet, Rfl: 3   cetirizine (ZYRTEC) 10 MG tablet, Take 10 mg by mouth daily., Disp: , Rfl:    chlorpheniramine-HYDROcodone  (TUSSIONEX) 10-8 MG/5ML, Take 5 mLs by mouth at bedtime as needed for cough., Disp: 120 mL, Rfl: 0   fluticasone  (FLONASE ) 50 MCG/ACT nasal spray, SPRAY 2 SPRAYS INTO EACH NOSTRIL EVERY DAY, Disp: 48 mL, Rfl: 1   Fluticasone -Umeclidin-Vilant (TRELEGY ELLIPTA ) 100-62.5-25 MCG/ACT AEPB, INHALE 1 PUFF BY MOUTH EVERY DAY, Disp: 60 each, Rfl: 1   furosemide  (LASIX ) 40 MG tablet, Take 1 tablet (40 mg total) by mouth daily. (Patient taking differently: Take 40 mg by mouth as needed for fluid.), Disp: 90 tablet, Rfl: 3   gabapentin  (NEURONTIN ) 300 MG capsule, Take 300 mg by mouth 3 (three) times daily as needed., Disp: , Rfl:    ipratropium-albuterol  (DUONEB) 0.5-2.5 (3) MG/3ML SOLN, Take 3 mLs by nebulization every 6 (six) hours as needed., Disp: 180 mL, Rfl: 1   predniSONE  (DELTASONE ) 20 MG tablet, 3 tabs poqday 1-3, 2 tabs poqday 4-6, 1 tab poqday 7-9, Disp: 18 tablet, Rfl: 0   rivaroxaban  (XARELTO ) 10 MG TABS tablet, Take 1 tablet (10 mg total)  by mouth daily., Disp: 90 tablet, Rfl: 3   rosuvastatin  (CRESTOR ) 20 MG tablet, Take 1 tablet (20 mg total) by mouth daily., Disp: 90 tablet, Rfl: 3   sacubitril -valsartan  (ENTRESTO ) 24-26 MG, Take 1 tablet by mouth 2 (two) times daily., Disp: 180 tablet, Rfl: 3   spironolactone  (ALDACTONE ) 25 MG tablet, Take 1 tablet (25 mg total) by mouth daily., Disp: 90 tablet, Rfl: 3   tizanidine  (ZANAFLEX ) 2 MG capsule, Take 2 mg by mouth 3 (three) times daily as needed for muscle spasms., Disp: , Rfl:    traMADol  (ULTRAM ) 50 MG tablet, Take 50  mg by mouth every 4 (four) hours as needed., Disp: , Rfl:    vitamin B-12 (CYANOCOBALAMIN ) 500 MCG tablet, Take 2,500 mcg by mouth daily., Disp: , Rfl:    vitamin C (ASCORBIC ACID) 500 MG tablet, Take 500 mg by mouth daily., Disp: , Rfl:    Respiratory Therapy Supplies (NEBULIZER/TUBING/MOUTHPIECE) KIT, Disp one nebulizer machine, tubing set and mouthpiece kit (Patient not taking: Reported on 01/10/2024), Disp: 1 kit, Rfl: 0  Current Facility-Administered Medications:    albuterol  (PROVENTIL ) (2.5 MG/3ML) 0.083% nebulizer solution 2.5 mg, 2.5 mg, Nebulization, Once,    Allergies  Allergen Reactions   Gadolinium Derivatives Shortness Of Breath and Nausea And Vomiting   Levaquin  [Levofloxacin  In D5w] Itching    Was giving with vancomycin  at same time - unsure which one pt had a reaction to   Sulfa Antibiotics Swelling    "Ears swelled up like Dumbo"   Vancomycin  Itching    Was giving at same time as Levaquin  - unsure which pt had reaction to   Aripiprazole  Other (See Comments)    Dry mouth with sores    Levofloxacin  Itching   Codeine Itching and Rash   Oxycodone  Itching   Penicillins Hives    Has patient had a PCN reaction causing immediate rash, facial/tongue/throat swelling, SOB or lightheadedness with hypotension: Yes Has patient had a PCN reaction causing severe rash involving mucus membranes or skin necrosis: No Has patient had a PCN reaction that required hospitalization No Has patient had a PCN reaction occurring within the last 10 years: No If all of the above answers are "NO", then may proceed with Cephalosporin use.     Social History   Tobacco Use   Smoking status: Never   Smokeless tobacco: Never  Vaping Use   Vaping status: Never Used  Substance Use Topics   Alcohol use: Not Currently    Comment: occasional wine   Drug use: Never      Chart Review Today: I personally reviewed active problem list, medication list, allergies, family history, social history,  health maintenance, notes from last encounter, lab results, imaging with the patient/caregiver today.   Review of Systems  Constitutional: Negative.   HENT: Negative.    Eyes: Negative.   Respiratory: Negative.    Cardiovascular: Negative.   Gastrointestinal: Negative.   Endocrine: Negative.   Genitourinary: Negative.   Musculoskeletal: Negative.   Skin: Negative.   Allergic/Immunologic: Negative.   Neurological: Negative.   Hematological: Negative.   Psychiatric/Behavioral: Negative.    All other systems reviewed and are negative.      Objective:   Vitals:   01/10/24 1118  BP: 122/78  Pulse: 99  Resp: 16  SpO2: 98%  Weight: 232 lb (105.2 kg)  Height: 5\' 4"  (1.626 m)    Body mass index is 39.82 kg/m.  Physical Exam Vitals and nursing note reviewed.  Constitutional:  General: She is not in acute distress.    Appearance: She is obese. She is not toxic-appearing or diaphoretic.  HENT:     Head: Normocephalic and atraumatic.     Right Ear: External ear normal.     Left Ear: External ear normal.  Cardiovascular:     Rate and Rhythm: Normal rate and regular rhythm.  Pulmonary:     Effort: Tachypnea (mild) present. No accessory muscle usage, respiratory distress or retractions.     Breath sounds: Wheezing and rhonchi present. No decreased breath sounds or rales.     Comments: Insp and exp wheeze throughout, rhonchi and some congestion to right upper to mid lung fields, improved BS overall and aeration compared to last week exam Skin:    General: Skin is warm.  Neurological:     Mental Status: She is alert.  Psychiatric:        Mood and Affect: Mood normal.        Behavior: Behavior normal.      Results for orders placed or performed in visit on 01/03/24  POC Covid19/Flu A&B Antigen   Collection Time: 01/03/24 11:46 AM  Result Value Ref Range   Influenza A Antigen, POC Negative Negative   Influenza B Antigen, POC Negative Negative   Covid Antigen, POC  Negative Negative       Assessment & Plan:   1. Chronic obstructive pulmonary disease with acute exacerbation (HCC) (Primary) Tx with steroids nebs OTC meds mucinex , rx cough meds and still very symptomatic, only very slight improvement, still coughing, SOB, very fatigued, CXR yesterday neg for pneumonia, extend steroids, ensure she is doing frequent nebs and add doxycycline  (cannot do zpak due to prolonged Qtc and multiple other abx allergies) Close f/up ER precautions reviewed, pt verbalized understanding Just barely got the call for her neb machine - she was using a family members Feel pt need to est with pulm with freq exacerbations - Fluticasone -Umeclidin-Vilant (TRELEGY ELLIPTA ) 100-62.5-25 MCG/ACT AEPB; Inhale 1 puff into the lungs daily.  Dispense: 60 each; Refill: 5 - Ambulatory referral to Pulmonology - predniSONE  (DELTASONE ) 20 MG tablet; Take 40 mg by mouth daily for 5 days, then 20 mg by mouth daily for 5 days, then 10 (half tab) mg daily for 4 days  Dispense: 17 tablet; Refill: 0  2. Lower respiratory tract infection Adding abx since sx ongoing and severe sx for more than a week - hopefully will cover any atypical infections - doxycycline  (VIBRA -TABS) 100 MG tablet; Take 1 tablet (100 mg total) by mouth 2 (two) times daily for 10 days.  Dispense: 20 tablet; Refill: 0  3. Antibiotic-induced yeast infection Prone to yeast infections - fluconazole  (DIFLUCAN ) 150 MG tablet; Take 1 tablet (150 mg total) by mouth every 3 (three) days as needed (for vaginal itching/yeast infection sx).  Dispense: 2 tablet; Refill: 1   also sent in pepcid  for GI protection with both prednisone  and doxy which I explained to pt can upset stomach/cause gastritis  Ambulatory pulseoximetry done prior to pt leaving and she was able to walk three laps around clinic and SpO2 was 93-94% on RA, safe to still continue with outpt tx   01/10/24 1200  Resting  Resting Heart Rate 75  Resting Sp02 98  Lap 1  (250 feet)  HR 91  02 Sat 94  Lap 2 (250 feet)  HR 94  02 Sat 93      Daekwon Beswick, PA-C 01/10/24 11:37 AM

## 2024-01-10 NOTE — Patient Instructions (Signed)
 Please call 911 or go to the ER if your breathing gets any worse - if you cannot walk and breath, speak a short sentence cause you are too short of breath, or if you are struggling to breath and a nebulizer treatment does not help you breath better. Recommend doing the duonebs three times a day and then start to decrease and then only do as needed once you start to have less coughing, shortness of breath and fatigue

## 2024-01-23 ENCOUNTER — Encounter: Payer: Self-pay | Admitting: Physician Assistant

## 2024-01-23 ENCOUNTER — Ambulatory Visit: Payer: Medicare Other | Attending: Physician Assistant | Admitting: Physician Assistant

## 2024-01-23 ENCOUNTER — Telehealth: Payer: Self-pay

## 2024-01-23 VITALS — BP 118/62 | HR 90 | Ht 64.5 in | Wt 231.4 lb

## 2024-01-23 DIAGNOSIS — Z86711 Personal history of pulmonary embolism: Secondary | ICD-10-CM | POA: Diagnosis not present

## 2024-01-23 DIAGNOSIS — I10 Essential (primary) hypertension: Secondary | ICD-10-CM

## 2024-01-23 DIAGNOSIS — I5020 Unspecified systolic (congestive) heart failure: Secondary | ICD-10-CM

## 2024-01-23 DIAGNOSIS — I5022 Chronic systolic (congestive) heart failure: Secondary | ICD-10-CM

## 2024-01-23 NOTE — Assessment & Plan Note (Signed)
 On chronic anticoagulation with Xarelto  due to pulmonary embolism and DVT. Reports excessive bruising. Discussed potential switch to Eliquis, but she prefers Xarelto  due to current stability and dosing convenience. Acknowledged that some patients experience less bleeding with Eliquis, but current management remains effective. - Continue Xarelto  10 mg daily

## 2024-01-23 NOTE — Telephone Encounter (Signed)
 Called and spoke with patient.   Patient is scheduled for CRT-D implant on 02/26/24.  Patient will go to have labs drawn between 6/8-6/15.   Labs ordered and released.   Patient prefers instructions through MyChart. Sent

## 2024-01-23 NOTE — Patient Instructions (Signed)
 Medication Instructions:  Your physician recommends that you continue on your current medications as directed. Please refer to the Current Medication list given to you today.  *If you need a refill on your cardiac medications before your next appointment, please call your pharmacy*  Lab Work: None ordered  If you have labs (blood work) drawn today and your tests are completely normal, you will receive your results only by: MyChart Message (if you have MyChart) OR A paper copy in the mail If you have any lab test that is abnormal or we need to change your treatment, we will call you to review the results.  Testing/Procedures: None ordered  Follow-Up: At [facility], you and your health needs are our priority.  As part of our continuing mission to provide you with exceptional heart care, our providers are all part of one team.  This team includes your primary Cardiologist (physician) and Advanced Practice Providers or APPs (Physician Assistants and Nurse Practitioners) who all work together to provide you with the care you need, when you need it.  Your next appointment:   6 month(s)  Provider:   Marlyse Single, PA-C          We recommend signing up for the patient portal called "MyChart".  Sign up information is provided on this After Visit Summary.  MyChart is used to connect with patients for Virtual Visits (Telemedicine).  Patients are able to view lab/test results, encounter notes, upcoming appointments, etc.  Non-urgent messages can be sent to your provider as well.   To learn more about what you can do with MyChart, go to [email or web address].   Other Instructions

## 2024-01-23 NOTE — Progress Notes (Signed)
 Cardiology Office Note:    Date:  01/23/2024  ID:  ELNORE COSENS, DOB 1956/02/06, MRN [id number] PCP: Adeline Hone, PA-C  [facility] Providers Cardiologist:  Dorothye Gathers, MD Electrophysiologist:  Ardeen Kohler, MD       Patient Profile:         (HFrEF) heart failure with reduced ejection fraction  Non-ischemic cardiomyopathy Cath 10/28/15: oLCx 50 - prob spasm  Hx of intol of Entresto  (ortho hypotension) Intol of SGLT2i 2/2 UTIs Previously followed by Dr. Julane Ny [facility] Echocardiogram 12/16: EF 35-40 Echocardiogram 5/18: EF 30-35, Gr 1 DD cMRI 12/18: EF 52, no LGE Echocardiogram 6/22: EF 25-30 CPET 08/10/2021: Main limitation ventilatory related to obesity and restrictive lung physiology; elevated VE/VCO2 suggest mild HF limitation TTE 10/18/2021: EF 20-25 TTE 10/06/2022: EF 40, GR 1 DD, normal RVSF, trivial MR, RAP 3 Myoview  07/17/2023: Inferior and apical infarct with apical peri-infarct ischemia, EF 25, intermediate risk TTE 11/23/23: EF 25-30, inf septum DK, ant septum AK, lat, inf, apical HK; Gr 1 DD, NL RVSF, NL PASP, mild MR, RAP 3  Monitor 12/2023: Sinus rhythm with bundle branch block.  Average heart rate 78 bpm; Brief nonsustained ventricular tachycardia, 7 beats duration-no symptoms; Rare PACs, occasional PVCs, 2.1%.  On monitor 2, 5% PVCs were noted.  Still occasional. No atrial fibrillation, no pauses Hx of recurrent pulmonary embolism  On chronic anticoagulation - Rx w/ Rivaroxaban  Secondary pulmonary hypertension  Breast CA Hypertension  Left Bundle Branch Block Hx of DVT and pulmonary embolism in 2016 COPD               Discussed the use of AI scribe software for clinical note transcription with the patient, who gave verbal consent to proceed.  History of Present Illness Patricia Fischer is a 68 y.o. female who presents for follow up of CHF. She was last seen in 10/2023. She noted progressively worsening shortness of breath. She also noted  a syncopal episode. BNP was normal. TTE showed EF 25-30. Monitor showed NSVT, no AFib or pauses. I referred her to EP to consider CRT-D. She saw Dr. Daneil Dunker in 12/2023. She is scheduled for CRT-D in June 2025.   She experiences ongoing fatigue and dyspnea, which she attributes to her heart condition. She recently recovered from pneumonia, leaving her extremely tired and unable to function normally. She wants to improve her physical activity, specifically aiming to walk to her son's house and climb church steps without becoming breathless. She reports excessive bruising, which she attributes to her medication. She has not experienced significant bleeding issues but notes that her skin bruises easily with minor trauma. She has not had chest pain, orthopnea, leg edema.    ROS-See HPI       Studies Reviewed:         Risk Assessment/Calculations:             Physical Exam:   VS:  BP 118/62   Pulse 90   Ht 5' 4.5" (1.638 m)   Wt 231 lb 6.4 oz (105 kg)   SpO2 94%   BMI 39.11 kg/m    Wt Readings from Last 3 Encounters:  01/23/24 231 lb 6.4 oz (105 kg)  01/10/24 232 lb (105.2 kg)  01/03/24 231 lb 11.2 oz (105.1 kg)    Constitutional:      Appearance: Healthy appearance. Not in distress.  Neck:     Vascular: JVD normal.  Pulmonary:     Breath sounds: Normal breath  sounds. No wheezing. No rales.  Cardiovascular:     Normal rate. Regular rhythm.     Murmurs: There is no murmur.  Edema:    Peripheral edema absent.  Abdominal:     Palpations: Abdomen is soft.        Assessment and Plan:   Assessment & Plan HFrEF (heart failure with reduced ejection fraction) (HCC) EF 25-30%, NYHA class III. Volume status is currently stable. She is intolerant of SGLT2 inhibitors due to UTIs. Scheduled for CRT-D implantation with Dr. Daneil Dunker in June - Continue carvedilol  6.25 mg twice daily - Continue Entresto  24/26 mg twice daily - Continue spironolactone  25 mg daily - Continue furosemide  40 mg daily  as needed - Proceed with CRT-D implantation in June - Follow up with electrophysiology post-procedure - Follow up with general cardiology in six months History of pulmonary embolism On chronic anticoagulation with Xarelto  due to pulmonary embolism and DVT. Reports excessive bruising. Discussed potential switch to Eliquis, but she prefers Xarelto  due to current stability and dosing convenience. Acknowledged that some patients experience less bleeding with Eliquis, but current management remains effective. - Continue Xarelto  10 mg daily Essential hypertension Blood pressure is well-controlled with current medication regimen. - Continue carvedilol  6.25 mg twice daily - Continue Entresto  24/26 mg twice daily - Continue Spironolactone  25 mg once daily          Dispo:  Return in about 6 months (around 07/25/2024) for Routine Follow Up, w/ Marlyse Single, PA-C.  Signed, Marlyse Single, PA-C

## 2024-01-23 NOTE — Assessment & Plan Note (Signed)
 EF 25-30%, NYHA class III. Volume status is currently stable. She is intolerant of SGLT2 inhibitors due to UTIs. Scheduled for CRT-D implantation with Dr. Daneil Dunker in June - Continue carvedilol  6.25 mg twice daily - Continue Entresto  24/26 mg twice daily - Continue spironolactone  25 mg daily - Continue furosemide  40 mg daily as needed - Proceed with CRT-D implantation in June - Follow up with electrophysiology post-procedure - Follow up with general cardiology in six months

## 2024-02-15 LAB — BASIC METABOLIC PANEL WITH GFR
BUN: 13 mg/dL (ref 8–27)
CO2: 25 mmol/L (ref 20–29)
Calcium: 9.3 mg/dL (ref 8.7–10.3)
Chloride: 100 mmol/L (ref 96–106)
Glucose: 191 mg/dL — ABNORMAL HIGH (ref 70–99)
Potassium: 4.2 mmol/L (ref 3.5–5.2)
Sodium: 144 mmol/L (ref 134–144)

## 2024-02-15 LAB — CBC

## 2024-02-17 ENCOUNTER — Ambulatory Visit
Admission: EM | Admit: 2024-02-17 | Discharge: 2024-02-17 | Disposition: A | Discharge status: Home / Self Care | Attending: Emergency Medicine | Admitting: Emergency Medicine

## 2024-02-17 DIAGNOSIS — N390 Urinary tract infection, site not specified: Secondary | ICD-10-CM | POA: Diagnosis present

## 2024-02-17 DIAGNOSIS — R319 Hematuria, unspecified: Secondary | ICD-10-CM | POA: Diagnosis present

## 2024-02-17 DIAGNOSIS — Z8619 Personal history of other infectious and parasitic diseases: Secondary | ICD-10-CM | POA: Diagnosis present

## 2024-02-17 LAB — POCT URINALYSIS DIP (MANUAL ENTRY)
Glucose, UA: NEGATIVE mg/dL
Nitrite, UA: POSITIVE — AB
Protein Ur, POC: >=300 mg/dL — AB
Spec Grav, UA: 1.02 (ref 1.010–1.025)
Urobilinogen, UA: 1 U/dL
pH, UA: 7 (ref 5.0–8.0)

## 2024-02-17 MED ORDER — CEPHALEXIN 500 MG PO CAPS: 500.0000 mg | ORAL_CAPSULE | Freq: Three times a day (TID) | ORAL | 0 refills | Status: AC

## 2024-02-17 MED ORDER — FLUCONAZOLE 150 MG PO TABS: 150.0000 mg | ORAL_TABLET | Freq: Once | ORAL | 0 refills | Status: AC

## 2024-02-17 NOTE — ED Triage Notes (Signed)
 Patient to Urgent Care with complaints of suprapubic pressure/ dysuria/ hematuria. Denies any fevers.   Symptoms started yesterday.   No otc meds.

## 2024-02-17 NOTE — ED Provider Notes (Signed)
 Arlander Bellman    CSN: [id number] Arrival date & time: 02/17/24  1054      History   Chief Complaint Chief Complaint  Patient presents with   Urinary Frequency    HPI Patricia Fischer is a 68 y.o. female.  Accompanied by her daughter-in-law, patient presents with 1 day history of dysuria, bladder pressure, hematuria.  No fever, abdominal pain, flank pain.  Her medical history includes cystitis and hematuria.  She is followed by urology; last seen on 11/10/2023.  The history is provided by the patient, a relative and medical records.    Past Medical History:  Diagnosis Date   Allergy    Anxiety    Arthritis    Asthma    Blood transfusion 1979   Chidbirth   Breast cancer, left breast (HCC) 2011   DCIS   Chronic systolic CHF w/ Improved LVF 05/05/2015   NICM // Dx 04/2015 - EF initially 25-30% with RV dysfunction // f/u echo 08/2015 technically difficult, EF 35-40%, anterior, anteroseptal, apical and inferoapical severe hypokinesis suggestive of LAD territory ischemia/infarct, grade 1 DD, mild MR, RV normal. // Echo 5/18: mild LVH, EF 30-35, Gr 1 DD // cMRI 12/18: EF 52    COPD (chronic obstructive pulmonary disease) (HCC)    Depression    DVT (deep venous thrombosis) (HCC) 04/2015   a. Dx 04/2015 - patient returned 05/2015 after noncompliance with Xarelto .   Environmental allergies    Essential hypertension    Family Hx of adverse reaction to anesthesia     My Mother would get deathly sick    Gastroesophageal reflux disease    Head injury 2011   Hemorrhoids    History of cardiac catheterization    Cath 10/2015: oLCx 50 (prob cath induced spasm), no sig CAD, EF 35-45   LBBB (left bundle branch block)    Migraine HAs 11/2014   Obesity    Orthostatic hypotension    Pneumonia 2012   Saddle pulmonary embolus (HCC) 04/2015   a. Dx 04/2015 - patient returned 05/2015 after noncompliance with Xarelto .   Shortness of breath dyspnea    Subscapularis (muscle) sprain  11/22/2013   Syncope    a. Recurrent syncope in 2016 felt 2/2 orthostasis in setting of PE.    Patient Active Problem List   Diagnosis Date Noted   Positive colorectal cancer screening using Cologuard test 03/16/2023   Pain of right shoulder joint on movement 08/04/2022   Osteoarthritis of knee 03/17/2022   Claustrophobia 02/09/2022   Pain in joint of left knee 12/21/2021   Derangement of left knee 12/21/2021   Chronic obstructive pulmonary disease (HCC) 01/22/2020   Stress incontinence 12/27/2019   Hyperlipidemia 12/27/2019   Muscle spasms of lower extremity 12/27/2019   Class 3 severe obesity with body mass index (BMI) of 40.0 to 44.9 in adult 07/12/2017   Allergic rhinitis 06/26/2017   Insomnia 06/26/2017   Hematuria 07/27/2016   Degenerative joint disease (DJD) of hip 03/31/2016   Hx of breast cancer 03/22/2016   History of pulmonary embolism 10/27/2015   HTN (hypertension) 10/07/2015   Depression, major, recurrent, moderate (HCC) 06/11/2015   Anemia 05/15/2015   Do not resuscitate status 05/15/2015   HFrEF (heart failure with reduced ejection fraction) (HCC) 05/05/2015   Left leg DVT (HCC) 05/05/2015   Pulmonary hypertension (HCC)    Saddle pulmonary embolus (HCC)    LBBB (left bundle branch block)-rate related  04/19/2015   Chronic migraine without aura, with intractable migraine,  so stated, with status migrainosus 11/04/2014    Past Surgical History:  Procedure Laterality Date   ABDOMINAL HYSTERECTOMY  1986   ANAL FISSURE REPAIR     ANTERIOR CERVICAL DECOMP/DISCECTOMY FUSION  01/23/2012   Procedure: ANTERIOR CERVICAL DECOMPRESSION/DISCECTOMY FUSION 1 LEVEL;  Surgeon: Ferris Hua, MD;  Location: [facility];  Service: Neurosurgery;  Laterality: N/A;  Cervical five-six anterior cervical discectomy fusion with plating   APPENDECTOMY     BREAST BIOPSY Left 06/2016   US  biopsy-PAPILLARY CYSTIC APOCRINE CHANGE    BREAST SURGERY Left 2011   Breast lumpectomy   CARDIAC  CATHETERIZATION  2010   [facility]; pt states it was clear   CARDIAC CATHETERIZATION N/A 10/28/2015   Procedure: Right/Left Heart Cath and Coronary Angiography;  Surgeon: Arty Binning, MD;  Location: [facility] INVASIVE CV LAB;  Service: Cardiovascular;  Laterality: N/A;   CARPAL TUNNEL RELEASE Bilateral    CESAREAN SECTION  1982, 1979   CHOLECYSTECTOMY     COLONOSCOPY  01/16/2009   Dr Marquita Situ   KNEE CARTILAGE SURGERY Left    SHOULDER ARTHROSCOPY WITH SUBACROMIAL DECOMPRESSION, ROTATOR CUFF REPAIR AND BICEP TENDON REPAIR Right 11/22/2013   Procedure: RIGHT SHOULDER ATHROSCOPY OPEN SUBSCAP REPAIR DELTA-PECTORAL ;  Surgeon: Lorriane Rote, MD;  Location: [facility] OR;  Service: Orthopedics;  Laterality: Right;   TARSAL TUNNEL RELEASE Left 1990   TONSILLECTOMY     TUBAL LIGATION      OB History   No obstetric history on file.      Home Medications    Prior to Admission medications   Medication Sig Start Date End Date Taking? Authorizing Provider  cephALEXin  (KEFLEX ) 500 MG capsule Take 1 capsule (500 mg total) by mouth 3 (three) times daily for 7 days. 02/17/24 02/24/24 Yes Wellington Half, NP  fluconazole  (DIFLUCAN ) 150 MG tablet Take 1 tablet (150 mg total) by mouth once for 1 dose. 02/17/24 02/17/24 Yes Wellington Half, NP  albuterol  (VENTOLIN  HFA) 108 (90 Base) MCG/ACT inhaler TAKE 2 PUFFS BY MOUTH EVERY 6 HOURS AS NEEDED FOR WHEEZE OR SHORTNESS OF BREATH 12/25/23   Tapia, Leisa, PA-C  carvedilol  (COREG ) 6.25 MG tablet Take 1 tablet (6.25 mg total) by mouth 2 (two) times daily. 06/01/23   Hugh Madura, MD  cetirizine (ZYRTEC) 10 MG tablet Take 10 mg by mouth daily.    [provider]  famotidine  (PEPCID ) 20 MG tablet Take 20 mg by mouth 2 (two) times daily.    [provider]  fluticasone  (FLONASE ) 50 MCG/ACT nasal spray SPRAY 2 SPRAYS INTO EACH NOSTRIL EVERY DAY 10/31/22   Tapia, Leisa, PA-C  Fluticasone -Umeclidin-Vilant (TRELEGY ELLIPTA ) 100-62.5-25 MCG/ACT AEPB Inhale 1  puff into the lungs daily. 01/10/24   Tapia, Leisa, PA-C  furosemide  (LASIX ) 40 MG tablet Take 40 mg by mouth as needed for fluid or edema.    [provider]  gabapentin  (NEURONTIN ) 300 MG capsule Take 300 mg by mouth 3 (three) times daily as needed.    [provider]  predniSONE  (DELTASONE ) 20 MG tablet Take 40 mg by mouth daily for 5 days, then 20 mg by mouth daily for 5 days, then 10 (half tab) mg daily for 4 days Patient not taking: Reported on 02/17/2024 01/10/24   Tapia, Leisa, PA-C  rivaroxaban  (XARELTO ) 10 MG TABS tablet Take 1 tablet (10 mg total) by mouth daily. 06/01/23   Hugh Madura, MD  rosuvastatin  (CRESTOR ) 20 MG tablet Take 1 tablet (20 mg total) by  mouth daily. 06/01/23   Hugh Madura, MD  sacubitril -valsartan  (ENTRESTO ) 24-26 MG Take 1 tablet by mouth 2 (two) times daily. 06/01/23   Hugh Madura, MD  spironolactone  (ALDACTONE ) 25 MG tablet Take 1 tablet (25 mg total) by mouth daily. 06/01/23   Hugh Madura, MD  tizanidine  (ZANAFLEX ) 2 MG capsule Take 2 mg by mouth 3 (three) times daily as needed for muscle spasms.    [provider]  traMADol  (ULTRAM ) 50 MG tablet Take 50 mg by mouth every 4 (four) hours as needed. 02/15/22   [provider]  vitamin B-12 (CYANOCOBALAMIN ) 500 MCG tablet Take 2,500 mcg by mouth daily.    [provider]  vitamin C (ASCORBIC ACID) 500 MG tablet Take 500 mg by mouth daily.    [provider]    Family History Family History  Problem Relation Age of Onset   Breast cancer Paternal Aunt    Breast cancer Mother 29   Migraines Mother    Cancer Mother    Hypertension Mother    Diabetes Mother    Rectal cancer Father    Cancer Father    COPD Father    Alcohol abuse Father    Heart disease Paternal Grandmother    Stroke Paternal Grandmother    Heart attack Paternal Grandmother    Aneurysm Paternal Grandfather    COPD Paternal Grandfather    Breast cancer Cousin    Breast cancer Cousin     Breast cancer Cousin    Diabetes Sister    Hearing loss Son    Anesthesia problems Neg Hx     Social History Social History   Tobacco Use   Smoking status: Never   Smokeless tobacco: Never  Vaping Use   Vaping status: Never Used  Substance Use Topics   Alcohol use: Not Currently    Comment: occasional wine   Drug use: Never     Allergies   Gadolinium derivatives, Levaquin  [levofloxacin  in d5w], Sulfa antibiotics, Vancomycin , Aripiprazole , Levofloxacin , Codeine, Oxycodone , and Penicillins   Review of Systems Review of Systems  Constitutional:  Negative for chills and fever.  Gastrointestinal:  Negative for abdominal pain.  Genitourinary:  Positive for dysuria and hematuria. Negative for flank pain.     Physical Exam Triage Vital Signs ED Triage Vitals  Encounter Vitals Group     BP      Girls Systolic BP Percentile      Girls Diastolic BP Percentile      Boys Systolic BP Percentile      Boys Diastolic BP Percentile      Pulse      Resp      Temp      Temp src      SpO2      Weight      Height      Head Circumference      Peak Flow      Pain Score      Pain Loc      Pain Education      Exclude from Growth Chart    No data found.  Updated Vital Signs BP 136/89   Pulse 99   Temp 98.3 F (36.8 C)   Resp 18   SpO2 95%   Visual Acuity Right Eye Distance:   Left Eye Distance:   Bilateral Distance:    Right Eye Near:   Left Eye Near:    Bilateral Near:     Physical Exam Constitutional:  General: She is not in acute distress. HENT:     Mouth/Throat:     Mouth: Mucous membranes are moist.   Cardiovascular:     Rate and Rhythm: Normal rate and regular rhythm.  Pulmonary:     Effort: Pulmonary effort is normal. No respiratory distress.  Abdominal:     Palpations: Abdomen is soft.     Tenderness: There is no abdominal tenderness. There is no right CVA tenderness, left CVA tenderness, guarding or rebound.   Neurological:     Mental  Status: She is alert.      UC Treatments / Results  Labs (all labs ordered are listed, but only abnormal results are displayed) Labs Reviewed  POCT URINALYSIS DIP (MANUAL ENTRY) - Abnormal; Notable for the following components:      Result Value   Color, UA red (*)    Clarity, UA cloudy (*)    Bilirubin, UA moderate (*)    Ketones, POC UA trace (5) (*)    Blood, UA large (*)    Protein Ur, POC >=300 (*)    Nitrite, UA Positive (*)    Leukocytes, UA Trace (*)    All other components within normal limits  URINE CULTURE    EKG   Radiology No results found.  Procedures Procedures (including critical care time)  Medications Ordered in UC Medications - No data to display  Initial Impression / Assessment and Plan / UC Course  I have reviewed the triage vital signs and the nursing notes.  Pertinent labs & imaging results that were available during my care of the patient were reviewed by me and considered in my medical decision making (see chart for details).   Urinary tract infection with hematuria, history of vaginal candidiasis with antibiotic use.  Afebrile and vital signs are stable.  Treating today with cephalexin .  Also 1 tablet of Diflucan  prescribed as patient reports history of vaginal yeast infection when she takes antibiotics.  Instructed patient to follow-up with her urologist.  Education provided on urinary tract infection.  Patient and her daughter-in-law agreed to plan of care. Final Clinical Impressions(s) / UC Diagnoses   Final diagnoses:  Urinary tract infection with hematuria, site unspecified  History of candidiasis of vagina     Discharge Instructions      Take the antibiotic as directed.  The urine culture is pending.  We will call you if it shows the need to change or discontinue your antibiotic.    Follow up with your urologist.      ED Prescriptions     Medication Sig Dispense Auth. Provider   cephALEXin  (KEFLEX ) 500 MG capsule Take 1  capsule (500 mg total) by mouth 3 (three) times daily for 7 days. 21 capsule Wellington Half, NP   fluconazole  (DIFLUCAN ) 150 MG tablet Take 1 tablet (150 mg total) by mouth once for 1 dose. 1 tablet Wellington Half, NP      PDMP not reviewed this encounter.   Wellington Half, NP 02/17/24 1136

## 2024-02-17 NOTE — Discharge Instructions (Addendum)
 Take the antibiotic as directed.  The urine culture is pending.  We will call you if it shows the need to change or discontinue your antibiotic.    Follow up with your urologist.

## 2024-02-18 ENCOUNTER — Ambulatory Visit: Payer: Self-pay | Admitting: Cardiology

## 2024-02-19 ENCOUNTER — Ambulatory Visit (HOSPITAL_COMMUNITY): Payer: Self-pay

## 2024-02-19 ENCOUNTER — Encounter: Payer: Self-pay | Admitting: Cardiology

## 2024-02-19 LAB — URINE CULTURE: Culture: 30000 — AB

## 2024-02-22 ENCOUNTER — Encounter: Payer: Self-pay | Admitting: Emergency Medicine

## 2024-02-23 NOTE — Pre-Procedure Instructions (Addendum)
 Attempted to call patient regarding procedure instructions.  Left voicemail on the following  items: Arrival time 0800 Nothing to eat or drink after midnight Ok to take AM medication morning of procedure, hold fluids pill Lasix  and Spirolactone. Responsible person to drive you home and stay with you for 24 hrs Wash with special soap night before and morning of procedure If on anti-coagulant drug instructions Xarleto- last dose should be 02/23/24.

## 2024-02-26 ENCOUNTER — Encounter (HOSPITAL_COMMUNITY)
Admission: RE | Disposition: A | Discharge status: Home / Self Care | Payer: Self-pay | Source: Home / Self Care | Attending: Cardiology

## 2024-02-26 ENCOUNTER — Encounter (HOSPITAL_COMMUNITY): Payer: Self-pay | Admitting: Cardiology

## 2024-02-26 ENCOUNTER — Ambulatory Visit (HOSPITAL_COMMUNITY): Admitting: Anesthesiology

## 2024-02-26 ENCOUNTER — Observation Stay (HOSPITAL_COMMUNITY)
Admission: RE | Admit: 2024-02-26 | Discharge: 2024-02-29 | Disposition: A | Discharge status: Home / Self Care | Attending: Cardiology | Admitting: Cardiology

## 2024-02-26 ENCOUNTER — Other Ambulatory Visit: Payer: Self-pay

## 2024-02-26 DIAGNOSIS — Z79899 Other long term (current) drug therapy: Secondary | ICD-10-CM | POA: Diagnosis not present

## 2024-02-26 DIAGNOSIS — D696 Thrombocytopenia, unspecified: Secondary | ICD-10-CM | POA: Insufficient documentation

## 2024-02-26 DIAGNOSIS — Z86711 Personal history of pulmonary embolism: Secondary | ICD-10-CM | POA: Diagnosis not present

## 2024-02-26 DIAGNOSIS — I110 Hypertensive heart disease with heart failure: Secondary | ICD-10-CM | POA: Diagnosis not present

## 2024-02-26 DIAGNOSIS — J9601 Acute respiratory failure with hypoxia: Secondary | ICD-10-CM | POA: Insufficient documentation

## 2024-02-26 DIAGNOSIS — I447 Left bundle-branch block, unspecified: Secondary | ICD-10-CM | POA: Insufficient documentation

## 2024-02-26 DIAGNOSIS — I5022 Chronic systolic (congestive) heart failure: Secondary | ICD-10-CM

## 2024-02-26 DIAGNOSIS — F418 Other specified anxiety disorders: Secondary | ICD-10-CM

## 2024-02-26 DIAGNOSIS — J449 Chronic obstructive pulmonary disease, unspecified: Secondary | ICD-10-CM | POA: Insufficient documentation

## 2024-02-26 DIAGNOSIS — Z853 Personal history of malignant neoplasm of breast: Secondary | ICD-10-CM | POA: Insufficient documentation

## 2024-02-26 DIAGNOSIS — Z7901 Long term (current) use of anticoagulants: Secondary | ICD-10-CM | POA: Insufficient documentation

## 2024-02-26 DIAGNOSIS — I5020 Unspecified systolic (congestive) heart failure: Secondary | ICD-10-CM | POA: Diagnosis present

## 2024-02-26 HISTORY — PX: BIV ICD INSERTION CRT-D: EP1195

## 2024-02-26 HISTORY — PX: LEAD INSERTION: EP1212

## 2024-02-26 SURGERY — BIV ICD INSERTION CRT-D
Anesthesia: Monitor Anesthesia Care

## 2024-02-26 MED ORDER — PROPOFOL 10 MG/ML IV BOLUS
INTRAVENOUS | Status: DC | PRN
  Administered 2024-02-26: 70 mg via INTRAVENOUS
  Administered 2024-02-26: 30 mg via INTRAVENOUS

## 2024-02-26 MED ORDER — ALBUTEROL SULFATE (2.5 MG/3ML) 0.083% IN NEBU: 2.5000 mg | INHALATION_SOLUTION | RESPIRATORY_TRACT | Status: DC | PRN

## 2024-02-26 MED ORDER — LIDOCAINE HCL (PF) 1 % IJ SOLN
INTRAMUSCULAR | Status: AC
Start: 2024-02-26 — End: 2024-02-26
  Filled 2024-02-26: qty 60

## 2024-02-26 MED ORDER — ONDANSETRON HCL 4 MG/2ML IJ SOLN
4.0000 mg | Freq: Four times a day (QID) | INTRAMUSCULAR | Status: DC | PRN
  Administered 2024-02-26: 4 mg via INTRAVENOUS
  Filled 2024-02-26: qty 2

## 2024-02-26 MED ORDER — BUDESON-GLYCOPYRROL-FORMOTEROL 160-9-4.8 MCG/ACT IN AERO
2.0000 | INHALATION_SPRAY | Freq: Two times a day (BID) | RESPIRATORY_TRACT | Status: DC
  Administered 2024-02-26 – 2024-02-29 (×6): 2 via RESPIRATORY_TRACT
  Filled 2024-02-26: qty 5.9

## 2024-02-26 MED ORDER — MIDAZOLAM HCL 2 MG/2ML IJ SOLN
INTRAMUSCULAR | Status: AC
  Filled 2024-02-26: qty 2

## 2024-02-26 MED ORDER — FLUTICASONE PROPIONATE 50 MCG/ACT NA SUSP: 2.0000 | Freq: Every day | NASAL | Status: DC | PRN

## 2024-02-26 MED ORDER — SODIUM CHLORIDE 0.9 % IV SOLN
INTRAVENOUS | Status: AC
  Filled 2024-02-26: qty 2

## 2024-02-26 MED ORDER — DEXAMETHASONE SODIUM PHOSPHATE 10 MG/ML IJ SOLN
INTRAMUSCULAR | Status: DC | PRN
  Administered 2024-02-26: 8 mg via INTRAVENOUS

## 2024-02-26 MED ORDER — ACETAMINOPHEN 325 MG PO TABS
ORAL_TABLET | ORAL | Status: AC
  Filled 2024-02-26: qty 2

## 2024-02-26 MED ORDER — HEPARIN (PORCINE) IN NACL 1000-0.9 UT/500ML-% IV SOLN
INTRAVENOUS | Status: DC | PRN
Start: 2024-02-26 — End: 2024-02-26
  Administered 2024-02-26: 500 mL

## 2024-02-26 MED ORDER — LIDOCAINE 2% (20 MG/ML) 5 ML SYRINGE
INTRAMUSCULAR | Status: DC | PRN
Start: 2024-02-26 — End: 2024-02-26
  Administered 2024-02-26: 50 mg via INTRAVENOUS

## 2024-02-26 MED ORDER — SACUBITRIL-VALSARTAN 24-26 MG PO TABS
1.0000 | ORAL_TABLET | Freq: Two times a day (BID) | ORAL | Status: DC
  Administered 2024-02-27 – 2024-02-28 (×2): 1 via ORAL
  Filled 2024-02-26 (×3): qty 1

## 2024-02-26 MED ORDER — CHLORHEXIDINE GLUCONATE 4 % EX SOLN
4.0000 | Freq: Once | CUTANEOUS | Status: DC
Start: 2024-02-26 — End: 2024-02-26
  Filled 2024-02-26: qty 60

## 2024-02-26 MED ORDER — SUGAMMADEX SODIUM 200 MG/2ML IV SOLN
INTRAVENOUS | Status: DC | PRN
  Administered 2024-02-26: 200 mg via INTRAVENOUS

## 2024-02-26 MED ORDER — CEFAZOLIN SODIUM-DEXTROSE 2-4 GM/100ML-% IV SOLN
INTRAVENOUS | Status: AC
  Filled 2024-02-26: qty 100

## 2024-02-26 MED ORDER — DEXMEDETOMIDINE HCL IN NACL 200 MCG/50ML IV SOLN
INTRAVENOUS | Status: DC | PRN
Start: 2024-02-26 — End: 2024-02-26
  Administered 2024-02-26 (×2): 20 ug via INTRAVENOUS

## 2024-02-26 MED ORDER — VITAMIN B-12 500 MCG PO TABS: 2500.0000 ug | ORAL_TABLET | Freq: Every day | ORAL | Status: DC

## 2024-02-26 MED ORDER — ACETAMINOPHEN 500 MG PO TABS
1000.0000 mg | ORAL_TABLET | Freq: Once | ORAL | Status: AC
  Administered 2024-02-26: 1000 mg via ORAL
  Filled 2024-02-26: qty 2

## 2024-02-26 MED ORDER — IOHEXOL 350 MG/ML SOLN
INTRAVENOUS | Status: DC | PRN
Start: 2024-02-26 — End: 2024-02-26
  Administered 2024-02-26: 30 mL

## 2024-02-26 MED ORDER — SODIUM CHLORIDE 0.9 % IV SOLN
80.0000 mg | INTRAVENOUS | Status: AC
  Administered 2024-02-26: 80 mg

## 2024-02-26 MED ORDER — SODIUM CHLORIDE 0.9 % IV SOLN: INTRAVENOUS | Status: DC

## 2024-02-26 MED ORDER — ACETAMINOPHEN 325 MG PO TABS
325.0000 mg | ORAL_TABLET | ORAL | Status: DC | PRN
  Administered 2024-02-26 – 2024-02-28 (×6): 650 mg via ORAL
  Administered 2024-02-28: 325 mg via ORAL
  Administered 2024-02-28 – 2024-02-29 (×3): 650 mg via ORAL
  Filled 2024-02-26 (×4): qty 2
  Filled 2024-02-26: qty 1
  Filled 2024-02-26 (×5): qty 2

## 2024-02-26 MED ORDER — LORATADINE 10 MG PO TABS
10.0000 mg | ORAL_TABLET | Freq: Every day | ORAL | Status: DC
  Administered 2024-02-26 – 2024-02-29 (×4): 10 mg via ORAL
  Filled 2024-02-26 (×4): qty 1

## 2024-02-26 MED ORDER — POVIDONE-IODINE 10 % EX SWAB
2.0000 | Freq: Once | CUTANEOUS | Status: AC
  Administered 2024-02-26: 2 via TOPICAL

## 2024-02-26 MED ORDER — CARVEDILOL 6.25 MG PO TABS
6.2500 mg | ORAL_TABLET | Freq: Two times a day (BID) | ORAL | Status: DC
  Administered 2024-02-26 – 2024-02-28 (×3): 6.25 mg via ORAL
  Filled 2024-02-26 (×4): qty 1

## 2024-02-26 MED ORDER — SODIUM CHLORIDE 0.9 % IV SOLN: INTRAVENOUS | Status: DC | PRN

## 2024-02-26 MED ORDER — SUCCINYLCHOLINE CHLORIDE 200 MG/10ML IV SOSY
PREFILLED_SYRINGE | INTRAVENOUS | Status: DC | PRN
Start: 2024-02-26 — End: 2024-02-26
  Administered 2024-02-26: 100 mg via INTRAVENOUS

## 2024-02-26 MED ORDER — ALBUTEROL SULFATE HFA 108 (90 BASE) MCG/ACT IN AERS: 2.0000 | INHALATION_SPRAY | Freq: Four times a day (QID) | RESPIRATORY_TRACT | Status: DC | PRN

## 2024-02-26 MED ORDER — SACUBITRIL-VALSARTAN 24-26 MG PO TABS
1.0000 | ORAL_TABLET | Freq: Two times a day (BID) | ORAL | Status: DC
Start: 2024-02-26 — End: 2024-02-26

## 2024-02-26 MED ORDER — VITAMIN B-12 1000 MCG PO TABS
2500.0000 ug | ORAL_TABLET | Freq: Every day | ORAL | Status: DC
  Administered 2024-02-26 – 2024-02-29 (×4): 2500 ug via ORAL
  Filled 2024-02-26 (×4): qty 5

## 2024-02-26 MED ORDER — FENTANYL CITRATE (PF) 100 MCG/2ML IJ SOLN
25.0000 ug | INTRAMUSCULAR | Status: DC | PRN
  Administered 2024-02-26: 25 ug via INTRAVENOUS

## 2024-02-26 MED ORDER — FENTANYL CITRATE (PF) 100 MCG/2ML IJ SOLN
INTRAMUSCULAR | Status: DC | PRN
  Administered 2024-02-26 (×2): 50 ug via INTRAVENOUS

## 2024-02-26 MED ORDER — FENTANYL CITRATE (PF) 100 MCG/2ML IJ SOLN
INTRAMUSCULAR | Status: AC
  Filled 2024-02-26: qty 2

## 2024-02-26 MED ORDER — ROCURONIUM BROMIDE 10 MG/ML (PF) SYRINGE
PREFILLED_SYRINGE | INTRAVENOUS | Status: DC | PRN
Start: 2024-02-26 — End: 2024-02-26
  Administered 2024-02-26: 30 mg via INTRAVENOUS
  Administered 2024-02-26: 20 mg via INTRAVENOUS

## 2024-02-26 MED ORDER — MIDAZOLAM HCL 2 MG/2ML IJ SOLN
INTRAMUSCULAR | Status: DC | PRN
  Administered 2024-02-26: 2 mg via INTRAVENOUS

## 2024-02-26 MED ORDER — NOREPINEPHRINE 4 MG/250ML-% IV SOLN
INTRAVENOUS | Status: DC | PRN
Start: 2024-02-26 — End: 2024-02-26
  Administered 2024-02-26: 8 ug/min via INTRAVENOUS

## 2024-02-26 MED ORDER — TRAMADOL HCL 50 MG PO TABS
50.0000 mg | ORAL_TABLET | ORAL | Status: DC | PRN
  Administered 2024-02-26: 50 mg via ORAL
  Filled 2024-02-26 (×2): qty 1

## 2024-02-26 MED ORDER — ROSUVASTATIN CALCIUM 20 MG PO TABS
20.0000 mg | ORAL_TABLET | Freq: Every day | ORAL | Status: DC
  Administered 2024-02-26 – 2024-02-29 (×4): 20 mg via ORAL
  Filled 2024-02-26 (×4): qty 1

## 2024-02-26 MED ORDER — CEFAZOLIN SODIUM-DEXTROSE 2-4 GM/100ML-% IV SOLN
2.0000 g | INTRAVENOUS | Status: AC
  Administered 2024-02-26: 2 g via INTRAVENOUS

## 2024-02-26 MED ORDER — GABAPENTIN 300 MG PO CAPS
300.0000 mg | ORAL_CAPSULE | Freq: Three times a day (TID) | ORAL | Status: DC | PRN
  Administered 2024-02-26 – 2024-02-29 (×4): 300 mg via ORAL
  Filled 2024-02-26 (×4): qty 1

## 2024-02-26 MED ORDER — VITAMIN C 500 MG PO TABS
500.0000 mg | ORAL_TABLET | Freq: Every day | ORAL | Status: DC
  Administered 2024-02-27 – 2024-02-29 (×3): 500 mg via ORAL
  Filled 2024-02-26 (×3): qty 1

## 2024-02-26 MED ORDER — METHOCARBAMOL 500 MG PO TABS
500.0000 mg | ORAL_TABLET | Freq: Three times a day (TID) | ORAL | Status: DC | PRN
  Administered 2024-02-26 – 2024-02-28 (×4): 500 mg via ORAL
  Filled 2024-02-26 (×4): qty 1

## 2024-02-26 MED ORDER — SPIRONOLACTONE 25 MG PO TABS
25.0000 mg | ORAL_TABLET | Freq: Every day | ORAL | Status: DC
  Administered 2024-02-27 – 2024-02-29 (×3): 25 mg via ORAL
  Filled 2024-02-26 (×3): qty 1

## 2024-02-26 MED ORDER — EPHEDRINE SULFATE-NACL 50-0.9 MG/10ML-% IV SOSY
PREFILLED_SYRINGE | INTRAVENOUS | Status: DC | PRN
  Administered 2024-02-26: 5 mg via INTRAVENOUS

## 2024-02-26 MED ORDER — ONDANSETRON HCL 4 MG/2ML IJ SOLN
INTRAMUSCULAR | Status: DC | PRN
  Administered 2024-02-26: 4 mg via INTRAVENOUS

## 2024-02-26 MED ORDER — LIDOCAINE HCL (PF) 1 % IJ SOLN
INTRAMUSCULAR | Status: DC | PRN
Start: 2024-02-26 — End: 2024-02-26
  Administered 2024-02-26: 60 mL

## 2024-02-26 SURGICAL SUPPLY — 20 items
BALLOON ATTAIN 80 (BALLOONS) IMPLANT
CABLE SURGICAL S-101-97-12 (CABLE) ×1 IMPLANT
CATH ATTAIN COM SURV 6250V-EH (CATHETERS) IMPLANT
CATH ATTAIN SEL SURV 6248V-90 (CATHETERS) IMPLANT
ICD COBALT XT QUAD CRT DTPA2QQ (ICD Generator) IMPLANT
KIT ESSENTIALS PG (KITS) IMPLANT
LEAD ATTAIN PERFORMA S 4598-88 (Lead) IMPLANT
LEAD CAPSURE NOVUS 5076-52CM (Lead) IMPLANT
LEAD SPRINT QUAT SEC 6935M-62 (Lead) IMPLANT
MAT PREVALON FULL STRYKER (MISCELLANEOUS) IMPLANT
PAD DEFIB RADIO PHYSIO CONN (PAD) ×1 IMPLANT
POUCH AIGIS-R ANTIBACT ICD (Mesh General) ×1 IMPLANT
POUCH AIGIS-R ANTIBACT ICD LRG (Mesh General) IMPLANT
SHEATH 7FR PRELUDE SNAP 13 (SHEATH) IMPLANT
SHEATH 9.5FR PRELUDE SNAP 13 (SHEATH) IMPLANT
SHEATH 9FR PRELUDE SNAP 13 (SHEATH) IMPLANT
SLITTER 6232ADJ (MISCELLANEOUS) IMPLANT
TRAY PACEMAKER INSERTION (PACKS) ×1 IMPLANT
WIRE ACUITY WHISPER EDS 4648 (WIRE) IMPLANT
WIRE HI TORQ VERSACORE-J 145CM (WIRE) IMPLANT

## 2024-02-26 NOTE — TOC CM/SW Note (Signed)
 Transition of Care [facility]) - Inpatient Brief Assessment   Patient Details  Name: NASHONDA LIMBERG MRN: [id number] Date of Birth: 04/27/1956  Transition of Care [facility]) CM/SW Contact:    Lauraine FORBES Saa, LCSW Phone Number: 02/26/2024, 4:32 PM   Clinical Narrative:  4:32 PM Per chart review, patient resides at home alone. Patient has a PCP and insurance. Patient does not have SNF/HH/DME history. Patient's preferred pharmacy is [facility] [id number]. No TOC needs were identified at this time. TOC will continue to follow and be available to asisst.  Transition of Care Asessment: Insurance and Status: Insurance coverage has been reviewed Patient has primary care physician: Yes Home environment has been reviewed: Private Residence Prior level of function:: N/A Prior/Current Home Services: No current home services Social Drivers of Health Review: SDOH reviewed no interventions necessary Readmission risk has been reviewed: Yes Transition of care needs: no transition of care needs at this time

## 2024-02-26 NOTE — Anesthesia Preprocedure Evaluation (Deleted)
 Anesthesia Evaluation    Reviewed: Allergy & Precautions, Patient's Chart, lab work & pertinent test results  History of Anesthesia Complications Negative for: history of anesthetic complications  Airway        Dental   Pulmonary asthma , COPD, PE          Cardiovascular hypertension, Pt. on medications +CHF and + DVT     '25 TTE - Septal-lateral dyssynchrony. Left ventricular ejection fraction, by estimation, is 25 to 30%. The left ventricle demonstrates regional wall motion abnormalities (see scoring diagram/findings for description). Grade I diastolic dysfunction (impaired relaxation). Mild mitral valve regurgitation.     Neuro/Psych  Headaches PSYCHIATRIC DISORDERS Anxiety Depression     Claustrophobia     GI/Hepatic Neg liver ROS,GERD  ,,  Endo/Other    Class 3 obesity  Renal/GU negative Renal ROS  Female GU complaint     Musculoskeletal  (+) Arthritis , Osteoarthritis,    Abdominal   Peds  Hematology  On xarelto     Anesthesia Other Findings   Reproductive/Obstetrics  Breast cancer                               Anesthesia Physical Anesthesia Plan  ASA: 3  Anesthesia Plan: General   Post-op Pain Management: Tylenol  PO (pre-op)* and Minimal or no pain anticipated   Induction: Intravenous  PONV Risk Score and Plan: 3 and Treatment may vary due to age or medical condition, Ondansetron  and Dexamethasone   Airway Management Planned: Oral ETT  Additional Equipment: Arterial line  Intra-op Plan:   Post-operative Plan: Extubation in OR  Informed Consent:   Plan Discussed with: CRNA and Anesthesiologist  Anesthesia Plan Comments:          Anesthesia Quick Evaluation

## 2024-02-26 NOTE — H&P (Signed)
 Electrophysiology Note:   Date:  02/26/24  ID:  Patricia Fischer, DOB [birth year], MRN [id number]   Primary Cardiologist: Oneil Parchment, MD Electrophysiologist: Fonda Kitty, MD       History of Present Illness:   Patricia Fischer is a 68 y.o. female with h/o chronic systolic heart failure, left bundle branch block, pulmonary embolus who is being seen today for evaluation for CRT-D.   Discussed the use of AI scribe software for clinical note transcription with the patient, who gave verbal consent to proceed.   History of Present Illness She experiences symptoms of shortness of breath, which she attributes to her heart's decreased function. She has difficulty breathing when moving around and sometimes while lying flat, requiring her to sleep propped up with pillows. She also notes fatigue and difficulty with physical activities such as walking up steps. Her EKG from 2019 showed normal QRS duration, but by 2022, there was an IVCD pattern on her EKG. This change seems to have coincided with a decrease in her LVEF. She is currently on a regimen of GDMT, which she endorses compliance with.  Interval: Patient presents today for scheduled CRT-D implant. Reports feeling similar to baseline. Chronic SOB and orthopnea. Anxious about procedure.    Review of systems complete and found to be negative unless listed in HPI.    EP Information / Studies Reviewed:     EKG is not ordered today. EKG from 07/31/23 reviewed which showed sinus rhythm with IVCD, QRS .        [facility] 12/2023:    Sinus rhythm with bundle branch block.  Average heart rate 78 bpm   Brief nonsustained ventricular tachycardia, 7 beats duration-no symptoms   Rare PACs, occasional PVCs, 2.1%.  On monitor 2, 5% PVCs were noted.  Still occasional.   No atrial fibrillation, no pauses   Echo 11/23/23:   1. Septal-lateral dyssynchrony. Left ventricular ejection fraction, by  estimation, is 25 to 30%. The left ventricle has severely  decreased  function. The left ventricle demonstrates regional wall motion  abnormalities (see scoring diagram/findings for  description). Left ventricular diastolic parameters are consistent with  Grade I diastolic dysfunction (impaired relaxation).   2. Right ventricular systolic function is normal. The right ventricular  size is normal. There is normal pulmonary artery systolic pressure.   3. The mitral valve is normal in structure. Mild mitral valve  regurgitation. No evidence of mitral stenosis.   4. The aortic valve is tricuspid. Aortic valve regurgitation is not  visualized. No aortic stenosis is present.   5. The inferior vena cava is normal in size with greater than 50%  respiratory variability, suggesting right atrial pressure of 3 mmHg.    Nuclear Stress 07/19/23:    Findings are consistent with inferior and apical infarction with apical peri-infarct ischemia. The study is intermediate risk due to reduced EF and primarily fixed perfusion defect.   Baseline ECG rhythm shows normal sinus rhythm. ECG demonstrates left bundle branch block.   No ST deviation was noted. Arrhythmias during stress: occasional PVCs. Arrhythmias during recovery: occasional PVCs.   LV perfusion is abnormal. There is evidence of ischemia. There is evidence of infarction. Defect 1: There is a large defect with moderate reduction in uptake present in the apical to basal anteroseptal, inferior, inferoseptal and apex location(s) that is partially reversible. There is abnormal wall motion in the defect area. Consistent with infarction and peri-infarct ischemia.   Left ventricular function is abnormal. Global function is severely reduced.  Nuclear stress EF: 25%. The left ventricular ejection fraction is severely decreased (<30%). End diastolic cavity size is mildly enlarged. End systolic cavity size is mildly enlarged. No evidence of transient ischemic dilation (TID) noted.   Prior study not available for comparison.      Physical Exam:    Today's Vitals   02/26/24 0824  BP: (!) 128/92  Pulse: 95  Resp: (!) 25  Temp: 98.9 F (37.2 C)  TempSrc: Oral  SpO2: 92%  Weight: 106.6 kg  Height: 5' 4.5 (1.638 m)   Body mass index is 39.71 kg/m.   GEN: Well nourished, well developed in no acute distress NECK: No JVD CARDIAC: Normal rate, regular RESPIRATORY:  Clear to auscultation without rales, wheezing or rhonchi  ABDOMEN: Soft, non-distended EXTREMITIES:  Trace edema; No deformity    ASSESSMENT AND PLAN:     #. Chronic systolic heart failure: NYHA class II/III symptoms. LVEF 25-30%.  #. IVCD: Left bundle like pattern in septal precordial leads, but negative in V6, I and aVL. QRS duration . - Patient has LVEF less than 35% despite greater than 90 days of guideline directed medical therapy.  She meets criteria for primary prevention ICD.  Given that she has low LVEF and wide QRS, IVCD/left bundle pattern, we will pursue CRT-D implant. NYHA class II/III symptoms and wide QRS, .   -Explained risks, benefits, and alternatives to ICD implantation, including but not limited to bleeding, infection, damage to heart or lungs, heart attack, stroke, or death.  Pt verbalized understanding and agrees to proceed if indicated.  - Continue GDMT regimen of carvedilol  6.25 mg twice daily, Entresto  24-26 mg twice daily, spironolactone  25 mg daily.    #. History of pulmonary embolus:  - Plan to resume in 48-72 hours.   Signed, Fonda Kitty, MD

## 2024-02-26 NOTE — Transfer of Care (Signed)
 Immediate Anesthesia Transfer of Care Note  Patient: Patricia Fischer  Procedure(s) Performed: BIV ICD INSERTION CRT-D LEAD INSERTION  Patient Location: Cath Lab  Anesthesia Type:General  Level of Consciousness: awake, drowsy, and patient cooperative  Airway & Oxygen  Therapy: Patient Spontanous Breathing and Patient connected to face mask oxygen   Post-op Assessment: Report given to RN and Post -op Vital signs reviewed and stable  Post vital signs: Reviewed and stable  Last Vitals:  Vitals Value Taken Time  BP 135/72 1444  Temp    Pulse 89 02/26/24 14:44  Resp 27 02/26/24 14:44  SpO2 95 % 02/26/24 14:44  Vitals shown include unfiled device data.  Last Pain:  Vitals:   02/26/24 0824  TempSrc: Oral         Complications: There were no known notable events for this encounter.

## 2024-02-26 NOTE — Progress Notes (Addendum)
 Nurse paged as patient did not have home meds entered, including home pain medication. Updated BP 110/76. Had not taken any oral meds today. PDMP reviewed, no inappropriate fills. Home meds re-entered as per plan. Hold parameters entered on carvedilol  and Entresto . Will start spironolactone  tomorrow given late nature of the day and softer BP earlier, now improved. Per Dr. Kennyth, plan to resume anticoagulation in 48-72 hours. Has SCDs ordered by MD.

## 2024-02-26 NOTE — Anesthesia Preprocedure Evaluation (Addendum)
 Anesthesia Evaluation  Patient identified by MRN, date of birth, ID band Patient awake    Reviewed: Allergy & Precautions, NPO status , Patient's Chart, lab work & pertinent test results  History of Anesthesia Complications Negative for: history of anesthetic complications  Airway Mallampati: III  TM Distance: >3 FB Neck ROM: Full    Dental  (+) Dental Advisory Given   Pulmonary asthma , COPD, PE   Pulmonary exam normal        Cardiovascular hypertension, Pt. on medications +CHF   Rhythm:Regular Rate:Tachycardia   '25 TTE - Septal-lateral dyssynchrony. Left ventricular ejection fraction, by estimation, is 25 to 30%. The left ventricle demonstrates regional wall motion abnormalities (see scoring diagram/findings for description). Grade I diastolic dysfunction (impaired relaxation). Mild mitral valve regurgitation.     Neuro/Psych  Headaches PSYCHIATRIC DISORDERS Anxiety Depression     PTSD  Claustrophobia     GI/Hepatic Neg liver ROS,GERD  Controlled,,  Endo/Other   Obesity   Renal/GU negative Renal ROS     Musculoskeletal  (+) Arthritis ,    Abdominal  (+) + obese  Peds  Hematology  On xarelto     Anesthesia Other Findings   Reproductive/Obstetrics                             Anesthesia Physical Anesthesia Plan  ASA: 3  Anesthesia Plan: MAC   Post-op Pain Management: Tylenol  PO (pre-op)* and Minimal or no pain anticipated   Induction:   PONV Risk Score and Plan: 2 and Propofol  infusion and Treatment may vary due to age or medical condition  Airway Management Planned: Nasal Cannula and Natural Airway  Additional Equipment: None  Intra-op Plan:   Post-operative Plan:   Informed Consent: I have reviewed the patients History and Physical, chart, labs and discussed the procedure including the risks, benefits and alternatives for the proposed anesthesia with the patient or  authorized representative who has indicated his/her understanding and acceptance.       Plan Discussed with: CRNA, Anesthesiologist and Surgeon  Anesthesia Plan Comments: (Patient extremely anxious about procedure and ability to lie flat related to PTSD (and not CHF symptoms). Will attempt to sedate patient for procedure, but discussed GA as backup if patient unable to tolerate MAC)       Anesthesia Quick Evaluation

## 2024-02-26 NOTE — Anesthesia Procedure Notes (Signed)
 Procedure Name: Intubation Date/Time: 02/26/2024 11:26 AM  Performed by: Cindie Donald CROME, CRNAPre-anesthesia Checklist: Patient identified, Emergency Drugs available, Suction available and Patient being monitored Patient Re-evaluated:Patient Re-evaluated prior to induction Oxygen  Delivery Method: Circle System Utilized Preoxygenation: Pre-oxygenation with 100% oxygen  Induction Type: IV induction Ventilation: Mask ventilation without difficulty and Oral airway inserted - appropriate to patient size Laryngoscope Size: Cleotilde and 2 Grade View: Grade II Tube type: Oral Tube size: 7.5 mm Number of attempts: 1 Airway Equipment and Method: Stylet and Oral airway Placement Confirmation: ETT inserted through vocal cords under direct vision, positive ETCO2 and breath sounds checked- equal and bilateral Secured at: 22 cm Tube secured with: Tape Dental Injury: Teeth and Oropharynx as per pre-operative assessment

## 2024-02-27 ENCOUNTER — Ambulatory Visit (HOSPITAL_COMMUNITY)

## 2024-02-27 ENCOUNTER — Encounter (HOSPITAL_COMMUNITY): Payer: Self-pay | Admitting: Cardiology

## 2024-02-27 DIAGNOSIS — I447 Left bundle-branch block, unspecified: Secondary | ICD-10-CM | POA: Diagnosis not present

## 2024-02-27 DIAGNOSIS — I5022 Chronic systolic (congestive) heart failure: Secondary | ICD-10-CM | POA: Diagnosis not present

## 2024-02-27 DIAGNOSIS — J810 Acute pulmonary edema: Secondary | ICD-10-CM | POA: Diagnosis not present

## 2024-02-27 DIAGNOSIS — J9601 Acute respiratory failure with hypoxia: Secondary | ICD-10-CM

## 2024-02-27 DIAGNOSIS — I5020 Unspecified systolic (congestive) heart failure: Secondary | ICD-10-CM | POA: Diagnosis present

## 2024-02-27 DIAGNOSIS — D696 Thrombocytopenia, unspecified: Secondary | ICD-10-CM | POA: Diagnosis not present

## 2024-02-27 DIAGNOSIS — I110 Hypertensive heart disease with heart failure: Secondary | ICD-10-CM | POA: Diagnosis not present

## 2024-02-27 MED ORDER — FUROSEMIDE 10 MG/ML IJ SOLN
40.0000 mg | Freq: Once | INTRAMUSCULAR | Status: AC
  Administered 2024-02-27: 40 mg via INTRAVENOUS
  Filled 2024-02-27: qty 4

## 2024-02-27 MED ORDER — SODIUM CHLORIDE 0.9 % IV SOLN: 2.0000 g | INTRAVENOUS | Status: DC

## 2024-02-27 MED ORDER — FUROSEMIDE 10 MG/ML IJ SOLN: 40.0000 mg | Freq: Once | INTRAMUSCULAR | Status: DC

## 2024-02-27 MED ORDER — POTASSIUM CHLORIDE 20 MEQ PO PACK
20.0000 meq | PACK | Freq: Once | ORAL | Status: AC
  Administered 2024-02-27: 20 meq via ORAL
  Filled 2024-02-27: qty 1

## 2024-02-27 NOTE — Plan of Care (Signed)
   Problem: Education: Goal: Knowledge of General Education information will improve Description: Including pain rating scale, medication(s)/side effects and non-pharmacologic comfort measures Outcome: Progressing   Problem: Pain Managment: Goal: General experience of comfort will improve and/or be controlled Outcome: Progressing   Problem: Safety: Goal: Ability to remain free from injury will improve Outcome: Progressing

## 2024-02-27 NOTE — Discharge Summary (Incomplete)
 ELECTROPHYSIOLOGY PROCEDURE DISCHARGE SUMMARY    Patient ID: Patricia Fischer,  MRN: [id number], DOB/AGE: 69/16/1957 69 y.o.  Admit date: 02/26/2024 Discharge date: 02/29/2024  Primary Care Physician: Leavy Mole, PA-C  Primary Cardiologist: Oneil Parchment, MD  Electrophysiologist: Dr. Kennyth    Primary Diagnosis:  Chronic Systolic CHF  LBBB  Secondary Diagnosis: History of PE 2016 Thrombocytopenia -no reduction during current admission, concern for stress response  Allergies  Allergen Reactions   Gadolinium Derivatives Shortness Of Breath and Nausea And Vomiting   Levaquin  [Levofloxacin  In D5w] Itching    Was giving with vancomycin  at same time - unsure which one pt had a reaction to   Sulfa Antibiotics Swelling    Ears swelled up like Dumbo   Vancomycin  Itching    Was giving at same time as Levaquin  - unsure which pt had reaction to   Abilify  [Aripiprazole ] Other (See Comments)    Dry mouth with sores    Levofloxacin  Itching   Codeine Itching and Rash   Oxycodone  Itching   Penicillins Hives    Has patient had a PCN reaction causing immediate rash, facial/tongue/throat swelling, SOB or lightheadedness with hypotension: Yes Has patient had a PCN reaction causing severe rash involving mucus membranes or skin necrosis: No Has patient had a PCN reaction that required hospitalization No Has patient had a PCN reaction occurring within the last 10 years: No If all of the above answers are NO, then may proceed with Cephalosporin use.     Procedures This Admission:  1.  Implantation of a Medtronic BiV ICD on 02/26/24 by Dr. Kennyth.  The patient received a Medtronic Cobalt XT Quad CRT [id number] with Medtronic CapSureFix Novus [id number] right atrial lead and Medtronic Sprint Quattro Secure [id number] right ventricular lead. Pt received a Medtronic Attain Perform S MRI Surescan [id number] LV lead. DFTs were deferred at time of implant There were no post procedure complications 2.  CXR  on 02/27/24 demonstrated no pneumothorax status post device implantation.      Brief HPI: Patricia Fischer is a 68 y.o. female with PMH of HFrEF, LBBB, HTN, DVT/PE in 2016, GERD, left breast cancer, depression, was referred to electrophysiology in the outpatient setting  for consideration of ICD implantation.  Past medical history includes above.  The patient has persistent LV dysfunction despite guideline directed therapy.  Risks, benefits, and alternatives to ICD implantation were reviewed with the patient who wished to proceed.   Hospital Course:  The patient was admitted and underwent implantation of a Medtronic BiV ICD with details as outlined above. They were monitored on telemetry overnight which demonstrated appropriate pacing .  Left chest has ecchymosis and small hematoma.  This was monitored over the course of several days in the hospital and is unchanged at discharge.  The device was interrogated and found to be functioning normally.  CXR was obtained and demonstrated no pneumothorax status post device implantation..  Patient's hospital course was complicated by hypoxia requiring oxygen .  She was diuresed and aggressive pulmonary hygiene was instituted.  The patient was able to be weaned off O2.  She had an elevated D-dimer greater than 20.  Workup per [facility] included CTA PE which was negative but did show bibasilar consolidation.  Patient did not have fever or other systemic evidence of infection.  [facility] elected not to treat her for infectious process.  Bibasilar findings thought to be dependent atelectasis.  Post diuresis, the patient had soft blood pressures and she was kept an  additional night.  Entresto  and beta-blocker were held x1 dose. BP improved.  CR remained stable.  The patient's Coreg  was changed to Toprol  to achieve better control of PVCs as there were concerns that this would reduce BiV pacing.  Wound care, arm mobility, and restrictions were reviewed with the patient.  The patient was  examined and considered stable for discharge to home.   The patient's discharge medications include Entresto , metoprolol , spironolactone .  Anticoagulation resumption This patient should resume their Xarelto  on 6/26 PM.  Patient is on low-dose Xarelto  for DVT prophylaxis.  Physical Exam: Vitals:   02/29/24 0825 02/29/24 1015 02/29/24 1016 02/29/24 1215  BP: 111/68 (!) 117/52 (!) 117/52 96/81  Pulse:   (!) 107   Resp:    18  Temp:    97.8 F (36.6 C)  TempSrc:    Oral  SpO2:      Weight:      Height:        GEN- NAD. A&O x 3.  HEENT: Normocephalic, atraumatic Lungs- CTAB, normal effort.  Heart- RRR. No M/G/R.  GI- Soft, NT, ND.  Extremities- No clubbing, cyanosis, or edema Skin- Warm and dry, no rash or lesion. ICD site with ecchymosis on lateral borders, small hematoma over device which is soft/no tension  Discharge Medications:  Allergies as of 02/29/2024       Reactions   Gadolinium Derivatives Shortness Of Breath, Nausea And Vomiting   Levaquin  [levofloxacin  In D5w] Itching   Was giving with vancomycin  at same time - unsure which one pt had a reaction to   Sulfa Antibiotics Swelling   Ears swelled up like Dumbo   Vancomycin  Itching   Was giving at same time as Levaquin  - unsure which pt had reaction to   Abilify  [aripiprazole ] Other (See Comments)   Dry mouth with sores   Levofloxacin  Itching   Codeine Itching, Rash   Oxycodone  Itching   Penicillins Hives   Has patient had a PCN reaction causing immediate rash, facial/tongue/throat swelling, SOB or lightheadedness with hypotension: Yes Has patient had a PCN reaction causing severe rash involving mucus membranes or skin necrosis: No Has patient had a PCN reaction that required hospitalization No Has patient had a PCN reaction occurring within the last 10 years: No If all of the above answers are NO, then may proceed with Cephalosporin use.        Medication List     STOP taking these medications     carvedilol  6.25 MG tablet Commonly known as: COREG        TAKE these medications    albuterol  108 (90 Base) MCG/ACT inhaler Commonly known as: VENTOLIN  HFA TAKE 2 PUFFS BY MOUTH EVERY 6 HOURS AS NEEDED FOR WHEEZE OR SHORTNESS OF BREATH   ascorbic acid 500 MG tablet Commonly known as: VITAMIN C Take 500 mg by mouth daily.   cetirizine 10 MG tablet Commonly known as: ZYRTEC Take 10 mg by mouth daily.   fluticasone  50 MCG/ACT nasal spray Commonly known as: FLONASE  SPRAY 2 SPRAYS INTO EACH NOSTRIL EVERY DAY What changed: See the new instructions.   furosemide  40 MG tablet Commonly known as: LASIX  Take 40 mg by mouth daily as needed for fluid or edema.   gabapentin  300 MG capsule Commonly known as: NEURONTIN  Take 300 mg by mouth 3 (three) times daily as needed (nerve pain).   methocarbamol  500 MG tablet Commonly known as: ROBAXIN  Take 500 mg by mouth every 8 (eight) hours as needed for muscle spasms.  metoprolol  succinate 50 MG 24 hr tablet Commonly known as: TOPROL -XL Take 1 tablet (50 mg total) by mouth 2 (two) times daily. Take with or immediately following a meal.   rivaroxaban  10 MG Tabs tablet Commonly known as: Xarelto  Take 1 tablet (10 mg total) by mouth daily.   rosuvastatin  20 MG tablet Commonly known as: CRESTOR  Take 1 tablet (20 mg total) by mouth daily.   sacubitril -valsartan  24-26 MG Commonly known as: ENTRESTO  Take 1 tablet by mouth 2 (two) times daily.   spironolactone  25 MG tablet Commonly known as: ALDACTONE  Take 1 tablet (25 mg total) by mouth daily.   tizanidine  2 MG capsule Commonly known as: ZANAFLEX  Take 2 mg by mouth 3 (three) times daily as needed for muscle spasms.   traMADol  50 MG tablet Commonly known as: ULTRAM  Take 50 mg by mouth every 4 (four) hours as needed for severe pain (pain score 7-10).   Trelegy Ellipta  100-62.5-25 MCG/ACT Aepb Generic drug: Fluticasone -Umeclidin-Vilant Inhale 1 puff into the lungs daily.    vitamin B-12 500 MCG tablet Commonly known as: CYANOCOBALAMIN  Take 2,500 mcg by mouth daily.               Discharge Care Instructions  (From admission, onward)           Start     Ordered   02/29/24 0000  Discharge wound care:       Comments: See attached instructions for wound care.   02/29/24 1702            Disposition: Home with usual follow up as in AVS  Signed, Daphne Barrack, NP-C, AGACNP-BC [facility] - Electrophysiology  02/29/2024, 5:10 PM   I have seen, examined the patient, and reviewed the above assessment and plan.    Hospital Course: Patient presented on 6/23 for planned CRT-D implant. She underwent successful CRT-D implant but general anesthesia was required for patient comfort. She was kept overnight for routine monitoring. Found to some hypoxia after procedure, likely multifactorial from sedation, atelectasis, OSA/OHS, and mild pulmonary edema. She was diuresed. Ambulation encouraged. Oxygen  was able to be weaned off. CT scan was done and ruled out PE. Today, she reports feeling similar to baseline. No new or acute complaints.   General: Well developed, in no acute distress.  Neck: No JVD.  Cardiac: Normal rate, regular rhythm. Left chest defibrillator pocket with bruising but without hematoma or bleeding.  Resp: Normal work of breathing.  Ext: No edema.  Neuro: No gross focal deficits.  Psych: Normal affect.   Assessment and Plan: #. Chronic systolic heat failure #. IVCD/LBBB #. S/p CRT-D #. PVCs - CXR with appropriate lead position - Bedside device interrogation with appropriate device function and stable lead parameters. Shortened AV delays and pre-excited CS lead by 20ms.  - Continue home regimen of Entresto  24-26mg  BID and spironolactone  25mg  daily. Change carvedilol  to metoprolol  XL - uptitration of carvedilol  for PVC limited by hypotension. Hopefully will be able to get on more beta blockade with metoprolol . - Monitor PVC  burden with CRT-D. - Resume Xarelto  10mg  daily today.  - Usual discharge instructions regarding wound care and activity restrictions were provided. - Follow up in Device Clinic in ~2 weeks.  Duration of Discharge Encounter: 60 minutes  Fonda Kitty, MD 02/29/2024 9:23 PM

## 2024-02-27 NOTE — Anesthesia Postprocedure Evaluation (Signed)
 Anesthesia Post Note  Patient: Patricia Fischer  Procedure(s) Performed: BIV ICD INSERTION CRT-D LEAD INSERTION     Patient location during evaluation: PACU Anesthesia Type: General Level of consciousness: awake and alert Pain management: pain level controlled Vital Signs Assessment: post-procedure vital signs reviewed and stable Respiratory status: spontaneous breathing, nonlabored ventilation, respiratory function stable and patient connected to nasal cannula oxygen  Cardiovascular status: blood pressure returned to baseline and stable Postop Assessment: no apparent nausea or vomiting Anesthetic complications: no   There were no known notable events for this encounter.  Last Vitals:  Vitals:   02/27/24 1459 02/27/24 1626  BP:  (!) 101/58  Pulse:  79  Resp:  (!) 24  Temp:  36.6 C  SpO2: 93% 94%    Last Pain:  Vitals:   02/27/24 1626  TempSrc: Oral  PainSc:    Pain Goal: Patients Stated Pain Goal: 0 (02/27/24 1420)                 Patricia Fischer Like

## 2024-02-27 NOTE — Consult Note (Signed)
 Initial Consultation Note   Patient: Patricia Fischer FMW:[id number] DOB: 21-Mar-1956 PCP: Leavy Mole, PA-C DOA: 02/26/2024 DOS: the patient was seen and examined on 02/27/2024 Primary service: Kennyth Chew, MD  Referring physician: Daphne Barrack, NP Reason for consult: Hypoxia  Assessment/Plan: Assessment and Plan: 1F h/o COPD, HTN, HFrEF (LVEF 25-30%), LBBB, prior PE, and s/p CRT-D on 6/23 now with SOB/DOE c/b hypoxia requiring 4-->1L .  Hypoxia Presumed flash pulmonary edema iso relative hypertension iso HFrEF (pt with HFrEF and documented SBP 140s) Pt with elevated SBP while off home meds and likely experienced flash pulmonary edema in this setting. -IV lasix  40mg  BID for now; goal net neg 1-2L/d; strict I/Os; daily standing weights; K>4/Mg>2 -Continue pta GDMT: Coreg , and Entresto  -Strict BP control w/ SBP goal 100-110s given HFrEF -F/u BNP -F/u Ddimer to exclude PE (low likelihood); if abnl, pt will need CTA chest PE protocol -If no improvement in SOB/DOE with diuresis consider non-con CT and infectious w/u  COPD Low suspicion for AECOPD -PTA inhalers -F/u RVP to eval for URI and possibel AECOPD   TRH will sign off at present, please call us  again when needed.  HPI: Patricia Fischer is a 68 y.o. female with past medical history of COPD, HTN, HFrEF (LVEF 25-30%), LBBB, prior PE, and s/p CRT-D on 6/23 now with SOB/DOE c/b hypoxia requiring 4-->1L . Pt with O2 requirement following relative HTN and missed home meds likely causing flash pulmonary edema.  Review of Systems: As mentioned in the history of present illness. All other systems reviewed and are negative. Past Medical History:  Diagnosis Date   Allergy    Anxiety    Arthritis    Asthma    Blood transfusion 1979   Chidbirth   Breast cancer, left breast (HCC) 2011   DCIS   Chronic systolic CHF w/ Improved LVF 05/05/2015   NICM // Dx 04/2015 - EF initially 25-30% with RV dysfunction // f/u echo 08/2015  technically difficult, EF 35-40%, anterior, anteroseptal, apical and inferoapical severe hypokinesis suggestive of LAD territory ischemia/infarct, grade 1 DD, mild MR, RV normal. // Echo 5/18: mild LVH, EF 30-35, Gr 1 DD // cMRI 12/18: EF 52    COPD (chronic obstructive pulmonary disease) (HCC)    Depression    DVT (deep venous thrombosis) (HCC) 04/2015   a. Dx 04/2015 - patient returned 05/2015 after noncompliance with Xarelto .   Environmental allergies    Essential hypertension    Family Hx of adverse reaction to anesthesia     My Mother would get deathly sick    Gastroesophageal reflux disease    Head injury 2011   Hemorrhoids    History of cardiac catheterization    Cath 10/2015: oLCx 50 (prob cath induced spasm), no sig CAD, EF 35-45   LBBB (left bundle branch block)    Migraine HAs 11/2014   Obesity    Orthostatic hypotension    Pneumonia 2012   Saddle pulmonary embolus (HCC) 04/2015   a. Dx 04/2015 - patient returned 05/2015 after noncompliance with Xarelto .   Shortness of breath dyspnea    Subscapularis (muscle) sprain 11/22/2013   Syncope    a. Recurrent syncope in 2016 felt 2/2 orthostasis in setting of PE.   Past Surgical History:  Procedure Laterality Date   ABDOMINAL HYSTERECTOMY  1986   ANAL FISSURE REPAIR     ANTERIOR CERVICAL DECOMP/DISCECTOMY FUSION  01/23/2012   Procedure: ANTERIOR CERVICAL DECOMPRESSION/DISCECTOMY FUSION 1 LEVEL;  Surgeon: Arley SHAUNNA Helling, MD;  Location: [facility];  Service: Neurosurgery;  Laterality: N/A;  Cervical five-six anterior cervical discectomy fusion with plating   APPENDECTOMY     BIV ICD INSERTION CRT-D N/A 02/26/2024   Procedure: BIV ICD INSERTION CRT-D;  Surgeon: Kennyth Chew, MD;  Location: [facility] INVASIVE CV LAB;  Service: Cardiovascular;  Laterality: N/A;   BREAST BIOPSY Left 06/2016   US  biopsy-PAPILLARY CYSTIC APOCRINE CHANGE    BREAST SURGERY Left 2011   Breast lumpectomy   CARDIAC CATHETERIZATION  2010   [facility]; pt  states it was clear   CARDIAC CATHETERIZATION N/A 10/28/2015   Procedure: Right/Left Heart Cath and Coronary Angiography;  Surgeon: Victory LELON Sharps, MD;  Location: [facility] INVASIVE CV LAB;  Service: Cardiovascular;  Laterality: N/A;   CARPAL TUNNEL RELEASE Bilateral    CESAREAN SECTION  1982, 1979   CHOLECYSTECTOMY     COLONOSCOPY  01/16/2009   Dr Dessa   KNEE CARTILAGE SURGERY Left    LEAD INSERTION N/A 02/26/2024   Procedure: LEAD INSERTION;  Surgeon: Kennyth Chew, MD;  Location: [facility] INVASIVE CV LAB;  Service: Cardiovascular;  Laterality: N/A;   SHOULDER ARTHROSCOPY WITH SUBACROMIAL DECOMPRESSION, ROTATOR CUFF REPAIR AND BICEP TENDON REPAIR Right 11/22/2013   Procedure: RIGHT SHOULDER ATHROSCOPY OPEN SUBSCAP REPAIR DELTA-PECTORAL ;  Surgeon: Elspeth JONELLE Her, MD;  Location: [facility] OR;  Service: Orthopedics;  Laterality: Right;   TARSAL TUNNEL RELEASE Left 1990   TONSILLECTOMY     TUBAL LIGATION     Social History:  reports that she has never smoked. She has never used smokeless tobacco. She reports that she does not currently use alcohol. She reports that she does not use drugs.  Allergies  Allergen Reactions   Gadolinium Derivatives Shortness Of Breath and Nausea And Vomiting   Levaquin  [Levofloxacin  In D5w] Itching    Was giving with vancomycin  at same time - unsure which one pt had a reaction to   Sulfa Antibiotics Swelling    Ears swelled up like [place]   Vancomycin  Itching    Was giving at same time as Levaquin  - unsure which pt had reaction to   Abilify  [Aripiprazole ] Other (See Comments)    Dry mouth with sores    Levofloxacin  Itching   Codeine Itching and Rash   Oxycodone  Itching   Penicillins Hives    Has patient had a PCN reaction causing immediate rash, facial/tongue/throat swelling, SOB or lightheadedness with hypotension: Yes Has patient had a PCN reaction causing severe rash involving mucus membranes or skin necrosis: No Has patient had a PCN reaction that required  hospitalization No Has patient had a PCN reaction occurring within the last 10 years: No If all of the above answers are NO, then may proceed with Cephalosporin use.    Family History  Problem Relation Age of Onset   Breast cancer Paternal Aunt    Breast cancer Mother 52   Migraines Mother    Cancer Mother    Hypertension Mother    Diabetes Mother    Rectal cancer Father    Cancer Father    COPD Father    Alcohol abuse Father    Heart disease Paternal Grandmother    Stroke Paternal Grandmother    Heart attack Paternal Grandmother    Aneurysm Paternal Grandfather    COPD Paternal Grandfather    Breast cancer Cousin    Breast cancer Cousin    Breast cancer Cousin    Diabetes Sister    Hearing loss Son    Anesthesia  problems Neg Hx     Prior to Admission medications   Medication Sig Start Date End Date Taking? Authorizing Provider  albuterol  (VENTOLIN  HFA) 108 (90 Base) MCG/ACT inhaler TAKE 2 PUFFS BY MOUTH EVERY 6 HOURS AS NEEDED FOR WHEEZE OR SHORTNESS OF BREATH 12/25/23  Yes Tapia, Leisa, PA-C  carvedilol  (COREG ) 6.25 MG tablet Take 1 tablet (6.25 mg total) by mouth 2 (two) times daily. 06/01/23  Yes Jeffrie Oneil BROCKS, MD  cetirizine (ZYRTEC) 10 MG tablet Take 10 mg by mouth daily.   Yes [provider]  fluticasone  (FLONASE ) 50 MCG/ACT nasal spray SPRAY 2 SPRAYS INTO EACH NOSTRIL EVERY DAY Patient taking differently: Place 2 sprays into both nostrils daily as needed for allergies. 10/31/22  Yes Tapia, Leisa, PA-C  Fluticasone -Umeclidin-Vilant (TRELEGY ELLIPTA ) 100-62.5-25 MCG/ACT AEPB Inhale 1 puff into the lungs daily. 01/10/24  Yes Tapia, Leisa, PA-C  furosemide  (LASIX ) 40 MG tablet Take 40 mg by mouth daily as needed for fluid or edema.   Yes [provider]  gabapentin  (NEURONTIN ) 300 MG capsule Take 300 mg by mouth 3 (three) times daily as needed (nerve pain).   Yes [provider]  methocarbamol  (ROBAXIN ) 500 MG tablet Take 500 mg by mouth every 8  (eight) hours as needed for muscle spasms. 01/26/24  Yes [provider]  rivaroxaban  (XARELTO ) 10 MG TABS tablet Take 1 tablet (10 mg total) by mouth daily. 06/01/23  Yes Jeffrie Oneil BROCKS, MD  rosuvastatin  (CRESTOR ) 20 MG tablet Take 1 tablet (20 mg total) by mouth daily. 06/01/23  Yes Jeffrie Oneil BROCKS, MD  sacubitril -valsartan  (ENTRESTO ) 24-26 MG Take 1 tablet by mouth 2 (two) times daily. 06/01/23  Yes Jeffrie Oneil BROCKS, MD  spironolactone  (ALDACTONE ) 25 MG tablet Take 1 tablet (25 mg total) by mouth daily. 06/01/23  Yes Jeffrie Oneil BROCKS, MD  tizanidine  (ZANAFLEX ) 2 MG capsule Take 2 mg by mouth 3 (three) times daily as needed for muscle spasms.   Yes [provider]  traMADol  (ULTRAM ) 50 MG tablet Take 50 mg by mouth every 4 (four) hours as needed for severe pain (pain score 7-10). 02/15/22  Yes [provider]  vitamin B-12 (CYANOCOBALAMIN ) 500 MCG tablet Take 2,500 mcg by mouth daily.   Yes [provider]  vitamin C (ASCORBIC ACID) 500 MG tablet Take 500 mg by mouth daily.   Yes [provider]    Physical Exam: Vitals:   02/27/24 0819 02/27/24 1225 02/27/24 1459 02/27/24 1626  BP: 120/68 105/65  (!) 101/58  Pulse: 91 82  79  Resp: 18 20  (!) 24  Temp: (!) 97.5 F (36.4 C) 97.7 F (36.5 C)  97.9 F (36.6 C)  TempSrc: Oral Oral  Oral  SpO2: 94% 95% 93% 94%  Weight:      Height:       Data Reviewed:   Lab Results  Component Value Date   WBC 8.9 02/15/2024   HGB 12.9 02/15/2024   HCT 41.3 02/15/2024   MCV 97 02/15/2024   PLT 143 (L) 02/15/2024   Lab Results  Component Value Date   GLUCOSE 191 (H) 02/15/2024   CALCIUM  9.3 02/15/2024   NA 144 02/15/2024   K 4.2 02/15/2024   CO2 25 02/15/2024   CL 100 02/15/2024   BUN 13 02/15/2024   CREATININE 0.91 02/15/2024   Lab Results  Component Value Date   ALT 13 06/26/2023   AST 17 06/26/2023   ALKPHOS 79 10/06/2022   BILITOT 0.6 06/26/2023  Lab Results  Component Value Date   INR 1.12  09/07/2017   INR 1.09 10/27/2015   INR 1.10 04/19/2015    Radiology: DG Chest 2 View Result Date: 02/27/2024 CLINICAL DATA:  Cardiac device in situ. EXAM: CHEST - 2 VIEW COMPARISON:  Jan 09, 2024 FINDINGS: Stable cardiomegaly. Interval placement of left-sided defibrillator with leads in grossly good position. No pneumothorax is noted. Hypoinflation of the lungs is noted with minimal bibasilar subsegmental atelectasis and possible small pleural effusions. Bony thorax is unremarkable. IMPRESSION: Interval placement of left-sided fibrillator with leads in grossly good position. Hypoinflation of the lungs with minimal bibasilar subsegmental atelectasis and possible small pleural effusions. Electronically Signed   By: Lynwood Landy Raddle M.D.   On: 02/27/2024 10:08      Family Communication: N/A Primary team communication: Daphne Barrack  Thank you very much for involving us  in the care of your patient.  Author: Marsha Ada, MD 02/27/2024 6:15 PM  For on call review [email or web address].

## 2024-02-27 NOTE — Progress Notes (Addendum)
 Patient Name: Patricia Fischer Date of Encounter: 02/27/2024  Primary Cardiologist: Oneil Parchment, MD Electrophysiologist: Fonda Kitty, MD  Interval Summary   RN reports pt continues to require 1-3L O2   Pt up to chair all day, ambulated and desaturated to 82%.  She does not wear CPAP at night. Notes she has urinated all day.  Does not feel she is holding fluid  Vital Signs    Vitals:   02/27/24 0802 02/27/24 0819 02/27/24 1225 02/27/24 1459  BP:  120/68 105/65   Pulse: 62 91 82   Resp: 17 18 20    Temp:  (!) 97.5 F (36.4 C) 97.7 F (36.5 C)   TempSrc:  Oral Oral   SpO2: 97% 94% 95% 93%  Weight:      Height:        Intake/Output Summary (Last 24 hours) at 02/27/2024 1618 Last data filed at 02/27/2024 1400 Gross per 24 hour  Intake 720 ml  Output 1100 ml  Net -380 ml   Filed Weights   02/26/24 0824  Weight: 106.6 kg    Physical Exam    GEN- adult female sitting up in bed in NAD, alert and oriented x 3 today.   Lungs- Clear to ausculation bilaterally, normal work of breathing Cardiac- Regular rate and rhythm (VP), no murmurs, rubs or gallops. L chest sit with small amt ecchymosis, small hematoma, steri strips intact GI- soft, NT, ND, + BS Extremities- no clubbing or cyanosis. Trace LE edema  Telemetry    VP 70-80's with PVC's (personally reviewed)  Hospital Course    Patricia Fischer is a 68 y.o. female with PMH of HFrEF (LVEF 25-30%), LBBB, and prior PE admitted for planned placement of CRT-D.  EF did not recover despite GDMT.   Assessment & Plan    Chronic Systolic Heart Failure  NYHA II/III, LVEF 25-30% -continue GDMT  -s/p placement of CRT-D > reprogrammed this am to shorten AVD and pre-excite LV by 20 ms -lasix  40 mg IV x1 now + 20 mEq KCL  -strict I/O's   Acute vs Chronic Hypoxic Respiratory Failure  Suspect some element of chronic hypoxia with volume, +/- component of sleep disordered breathing, post anesthesia & atelectasis  -pulmonary  hygiene  -IS / mobilize  -CXR reviewed with low lung volumes but no significant volume -have asked medicine to consult for evaluation for hypoxia, appreciate input -plan to keep over night for observation  -SCD's   Hx PE / DVT  -plan to resume Eliquis post procedure on 6/27   For questions or updates, please contact [facility] Please consult [email or web address] for contact info under     Signed, Daphne Barrack, NP-C, AGACNP-BC  [facility] - Electrophysiology  02/27/2024, 4:21 PM  I have seen, examined the patient, and reviewed the above assessment and plan.    Interval:  Patient underwent CRT-D overnight. Had some left shoulder soreness, otherwise no complaints. Some low level O2 requirements since extubation.   General: Well developed, in no acute distress.  Neck: No JVD.  Cardiac: Normal rate, regular rhythm. Left chest pacer pocket without bleeding or hematoma.  Resp: Normal work of breathing.  Ext: No edema.  Neuro: No gross focal deficits.  Psych: Normal affect.   Assessment Plan:  #. Chronic systolic heat failure #. IVCD/LBBB #. S/p CRT-D - CXR with appropriate lead position - Bedside device interrogation with appropriate device function and stable lead parameters. We will shorten AV delays and pre-excite CS lead by 20ms.  -  Continue home regimen.  -Tentatively planning to resume Xarelto  tomorrow.   #. Acute hypoxic respiratory failure: Intubated for procedure yesterday. Low level O2 requirement since. Likely multifactorial - atelectasis, OSA, and mild pulmonary edema.  - IV lasix  40mg  x 1, assess UOP and titrate accordingly.  - Incentive spirometry.  - Encouraged ambulation.    Fonda Kitty, MD 02/27/2024 8:55 PM

## 2024-02-28 ENCOUNTER — Observation Stay (HOSPITAL_COMMUNITY)

## 2024-02-28 DIAGNOSIS — R0902 Hypoxemia: Secondary | ICD-10-CM | POA: Diagnosis not present

## 2024-02-28 DIAGNOSIS — I110 Hypertensive heart disease with heart failure: Secondary | ICD-10-CM | POA: Diagnosis not present

## 2024-02-28 DIAGNOSIS — J9601 Acute respiratory failure with hypoxia: Secondary | ICD-10-CM | POA: Diagnosis not present

## 2024-02-28 DIAGNOSIS — I5022 Chronic systolic (congestive) heart failure: Secondary | ICD-10-CM

## 2024-02-28 LAB — BASIC METABOLIC PANEL WITH GFR
Anion gap: 5 (ref 5–15)
BUN: 13 mg/dL (ref 8–23)
CO2: 36 mmol/L — ABNORMAL HIGH (ref 22–32)
Calcium: 9.3 mg/dL (ref 8.9–10.3)
Chloride: 96 mmol/L — ABNORMAL LOW (ref 98–111)
Creatinine, Ser: 0.83 mg/dL (ref 0.44–1.00)
GFR, Estimated: >60 mL/min (ref >60)
Glucose, Bld: 112 mg/dL — ABNORMAL HIGH (ref 70–99)
Potassium: 4.9 mmol/L (ref 3.5–5.1)
Sodium: 137 mmol/L (ref 135–145)

## 2024-02-28 LAB — D-DIMER, QUANTITATIVE: D-Dimer, Quant: >20 ug{FEU}/mL — ABNORMAL HIGH (ref 0.00–0.50)

## 2024-02-28 LAB — MAGNESIUM: Magnesium: 1.8 mg/dL (ref 1.7–2.4)

## 2024-02-28 MED ORDER — SACUBITRIL-VALSARTAN 24-26 MG PO TABS
1.0000 | ORAL_TABLET | Freq: Two times a day (BID) | ORAL | Status: DC
  Administered 2024-02-29: 1 via ORAL
  Filled 2024-02-28: qty 1

## 2024-02-28 MED ORDER — IOHEXOL 350 MG/ML SOLN
75.0000 mL | Freq: Once | INTRAVENOUS | Status: AC | PRN
  Administered 2024-02-28: 75 mL via INTRAVENOUS

## 2024-02-28 MED ORDER — CARVEDILOL 6.25 MG PO TABS: 6.2500 mg | ORAL_TABLET | Freq: Two times a day (BID) | ORAL | Status: DC

## 2024-02-28 MED ORDER — FUROSEMIDE 10 MG/ML IJ SOLN
40.0000 mg | Freq: Once | INTRAMUSCULAR | Status: AC
  Administered 2024-02-28: 40 mg via INTRAVENOUS
  Filled 2024-02-28: qty 4

## 2024-02-28 NOTE — Progress Notes (Addendum)
 Patient Name: Patricia Fischer Date of Encounter: 02/28/2024  Primary Cardiologist: Oneil Parchment, MD Electrophysiologist: Fonda Kitty, MD  Interval Summary   The pt reports she had good response to lasix  overnight. Notes soreness in left chest. O2 remains at 2L.  Vital Signs    Vitals:   02/27/24 1626 02/27/24 2011 02/27/24 2325 02/28/24 0633  BP: (!) 101/58 104/63 112/74 116/66  Pulse: 79 82 86 79  Resp: (!) 24 20 19 16   Temp: 97.9 F (36.6 C) 97.9 F (36.6 C) 97.9 F (36.6 C) 98 F (36.7 C)  TempSrc: Oral Oral Oral Oral  SpO2: 94% 93% 92% 90%  Weight:      Height:        Intake/Output Summary (Last 24 hours) at 02/28/2024 0743 Last data filed at 02/27/2024 2325 Gross per 24 hour  Intake 720 ml  Output 2585 ml  Net -1865 ml   Filed Weights   02/26/24 0824  Weight: 106.6 kg    Physical Exam    GEN- The patient is well appearing, alert and oriented x 3 today.   Lungs- Clear to ausculation bilaterally, normal work of breathing Cardiac- Regular rate and rhythm, no murmurs, rubs or gallops. CRT-D site with small amt bruising, small soft hematoma GI- soft, NT, ND, + BS Extremities- no clubbing or cyanosis. No edema  Telemetry    VP 80-90's with PVC's (personally reviewed)  Hospital Course    MOLLIE ROSSANO is a 68 y.o. female PMH of HFrEF (LVEF 25-30%), LBBB, and prior PE admitted for planned placement of CRT-D.  EF did not recover despite GDMT.  S/p BIV ICD.   Assessment & Plan    Chronic Systolic Heart Failure s/p CRT-D IVCD / LBBB NYHA II/III, LVEF 25-30% -s/p CRT-D, CS 20 ms > RV -continue diuresis, negative balance  -I/O's  -will review timing of restart of OAC with MD   Acute vs Chronic Hypoxic Respiratory Failure  Suspect some element of chronic hypoxia with volume, +/- component of sleep disordered breathing, post anesthesia & atelectasis.  Recent PNA. Less likely PE -appreciate TRH  -IS/ mobilize  -PT consult  -wean O2 for sats >90%   -SCD's -has outpatient pulmonary follow up arranged (pre-admit) -has refused PSG in past   Hopeful for discharge this afternoon pending O2 needs / PT eval.      For questions or updates, please contact [facility] Please consult [email or web address] for contact info under     Signed, Daphne Barrack, NP-C, AGACNP-BC [facility] - Electrophysiology  02/28/2024, 7:43 AM  I have seen, examined the patient, and reviewed the above assessment and plan.    Interval:  No acute overnight events. Patient reports feeling relatively well. No new or acute complaints.  Continues to have minimal oxygen  requirement.  General: Well developed, in no acute distress.  Neck: No JVD.  Cardiac: Normal rate, regular rhythm.  Left chest pacer pocket with bruising, soft without hematoma or bleeding. Resp: Normal work of breathing.  Ext: No edema.  Neuro: No gross focal deficits.  Psych: Normal affect.   Assessment and Plan:  #. Chronic systolic heat failure #. IVCD/LBBB #. S/p CRT-D - CXR with appropriate lead position - Bedside device interrogation with appropriate device function and stable lead parameters. We will shorten AV delays and pre-excite CS lead by 20ms.  - Continue home regimen.  -Tentatively planning to resume Xarelto  tomorrow.  Reassess pacer pocket daily.   #. Acute hypoxic respiratory failure: Intubated for procedure  yesterday. Low level O2 requirement since. Likely multifactorial - atelectasis, OSA, and mild pulmonary edema.  CTA PE protocol was negative. - S/p IV lasix  40mg  x 2 with robust urinary output.  Will pause on further diuresis for now.  Will hold Entresto  and carvedilol  this evening until patient can reequilibrated, likely over diuresed somewhat, correlating with low normal BP. - Incentive spirometry.  - Encouraged ambulation.  - Will target O2 sat 88 to 92% given likely obesity hypoventilation syndrome.  Fonda Kitty, MD 02/28/2024 9:29 PM

## 2024-02-28 NOTE — Progress Notes (Signed)
 PROGRESS NOTE   Patricia Fischer  FMW:[id number]    DOB: [birth year]    DOA: 02/26/2024  PCP: Leavy Mole, PA-C   I have briefly reviewed patients previous medical records in [facility].   Brief Hospital Course:  68 year old female with medical history significant for COPD reported due to secondhand smoke inhalation, HTN, chronic HFrEF, LBBB, prior PE (reports 27 clots in 2016) on long-term Xarelto  (on hold since 6/20 for procedure) admitted and underwent elective placement of CRT-D on 6/23, TRH were consulted for assistance on 6/24 due to hypoxia.   Assessment & Plan:   Acute on suspected chronic hypoxia Does not meet criteria for respiratory failure in the absence of dyspnea, sustained tachypnea. 6/24, on ambulation, desaturated to 82% on room air and placed on [place] oxygen  D-dimer positive, >20, followed up with CTA chest which is negative for PE but shows basal consolidation but in the absence of symptoms i.e. productive cough, dyspnea, fever, leukocytosis, low index of suspicion for pneumonia. Suspect that she has chronic hypoxia that is multifactorial related to COPD, possible OSA/OHS, some element of fluid overload, atelectasis, recurrent pneumonia (recent pneumonia a month prior) and acute bronchitis episodes and anesthesia for procedure. Diuresis as per primary team.  Incentive spirometry.  Home O2 evaluation and may qualify for home O2. Patient reports that she has an upcoming appointment with pulmonology on 7/28 and is advised to keep that.  Patient however refuses to have sleep study done.  COPD Per patient report, due to secondhand smoking Remotely did PFTs it appears.  Has an upcoming follow-up new appointment to see pulmonology on 7/28 No clinical bronchospasm Flutter valve  Suspected OSA/OHS Patient reports history of snoring. Never been tested and patient refuses to have sleep study done.  Chronic systolic CHF Management per primary service.  They gave her a  dose of IV Lasix  today.  This may help.  History of PE/DVT Patient on Xarelto  PTA which was held on 6/20 with plans to resume 6/27 CTA chest negative for PE.  Body mass index is 39.71 kg/m./Obesity Complicates care.  Outpatient follow-up.   DVT prophylaxis: SCDs Start: 02/26/24 1549     Code Status: Full Code:  Family Communication: None at bedside Disposition:  Status is: Observation The patient will require care spanning > 2 midnights and should be moved to inpatient because: If patient does not discharge today, care has already crossed 2 midnights and has received IV Lasix  yesterday and today and should meet for inpatient intensity.     Consultants:   TRH are consultants  Procedures:   As noted above  Subjective:  History mostly as noted above.  Eager to discharge home.  Not on home oxygen  PTA.  Reports pneumonia 3 days PTA.  Denies dyspnea or chest pain.  Objective:   Vitals:   02/28/24 0837 02/28/24 1144 02/28/24 1155 02/28/24 1159  BP:  (!) 85/59 (!) 88/62 91/62  Pulse:   87 86  Resp:  16    Temp:  98.1 F (36.7 C)    TempSrc:  Oral    SpO2: 96%  92% 93%  Weight:      Height:        General exam: Middle-age female, moderately built and obese lying comfortably propped up in bed without distress. Respiratory system: Diminished breath sounds in the bases but otherwise clear to auscultation.  No increased work of breathing. Cardiovascular system: S1 & S2 heard, RRR. No JVD, murmurs, rubs, gallops or clicks. No pedal edema.  Telemetry personally reviewed: V paced rhythm. Gastrointestinal system: Abdomen is nondistended, soft and nontender. No organomegaly or masses felt. Normal bowel sounds heard. Central nervous system: Alert and oriented. No focal neurological deficits. Extremities: Symmetric 5 x 5 power. Skin: No rashes, lesions or ulcers Psychiatry: Judgement and insight appear normal. Mood & affect appropriate.     Data Reviewed:   I have personally  reviewed following labs and imaging studies   CBC: No results for input(s): WBC, NEUTROABS, HGB, HCT, MCV, PLT in the last 168 hours.  Basic Metabolic Panel: Recent Labs  Lab 02/28/24 0437  NA 137  K 4.9  CL 96*  CO2 36*  GLUCOSE 112*  BUN 13  CREATININE 0.83  CALCIUM  9.3  MG 1.8    Liver Function Tests: No results for input(s): AST, ALT, ALKPHOS, BILITOT, PROT, ALBUMIN  in the last 168 hours.  CBG: No results for input(s): GLUCAP in the last 168 hours.  Microbiology Studies:  No results found for this or any previous visit (from the past 240 hours).  Radiology Studies:  CT Angio Chest Pulmonary Embolism (PE) W or WO Contrast Result Date: 02/28/2024 CLINICAL DATA:  Positive D-dimer, low/intermediate probability PE suspected, shortness of breath EXAM: CT ANGIOGRAPHY CHEST WITH CONTRAST TECHNIQUE: Multidetector CT imaging of the chest was performed using the standard protocol during bolus administration of intravenous contrast. Multiplanar CT image reconstructions and MIPs were obtained to evaluate the vascular anatomy. RADIATION DOSE REDUCTION: This exam was performed according to the departmental dose-optimization program which includes automated exposure control, adjustment of the mA and/or kV according to patient size and/or use of iterative reconstruction technique. CONTRAST:  75mL OMNIPAQUE  IOHEXOL  350 MG/ML SOLN COMPARISON:  10/27/2015 FINDINGS: Cardiovascular: Interval placement of left subclavian AICD. SVC remains patent. Heart size normal. No pericardial effusion. The RV is nondilated. Satisfactory opacification of pulmonary arteries noted, and there is no evidence of pulmonary emboli. Adequate contrast opacification of the thoracic aorta with no evidence of dissection, aneurysm, or stenosis. There is classic 3-vessel brachiocephalic arch anatomy without proximal stenosis. Minimal calcified plaque in the arch and descending thoracic aorta  Mediastinum/Nodes: No hematoma, mass, or adenopathy. Lungs/Pleura: No pleural effusion. No pneumothorax. Dependent consolidation posteriorly in both lungs, new since previous. Upper Abdomen: Cholecystectomy clips.  No acute findings. Musculoskeletal: Obesity. Cervical fixation hardware partially visualized. Vertebral endplate spurring at multiple levels in the lower thoracic spine. Review of the MIP images confirms the above findings. IMPRESSION: 1. Negative for acute PE or thoracic aortic dissection. 2. Dependent consolidation posteriorly in both lungs, new since previous. 3.  Aortic Atherosclerosis (ICD10-I70.0). Electronically Signed   By: JONETTA Faes M.D.   On: 02/28/2024 13:41   DG Chest 2 View Result Date: 02/27/2024 CLINICAL DATA:  Cardiac device in situ. EXAM: CHEST - 2 VIEW COMPARISON:  Jan 09, 2024 FINDINGS: Stable cardiomegaly. Interval placement of left-sided defibrillator with leads in grossly good position. No pneumothorax is noted. Hypoinflation of the lungs is noted with minimal bibasilar subsegmental atelectasis and possible small pleural effusions. Bony thorax is unremarkable. IMPRESSION: Interval placement of left-sided fibrillator with leads in grossly good position. Hypoinflation of the lungs with minimal bibasilar subsegmental atelectasis and possible small pleural effusions. Electronically Signed   By: Lynwood Landy Raddle M.D.   On: 02/27/2024 10:08    Scheduled Meds:    ascorbic acid  500 mg Oral Daily   budesonide -glycopyrrolate -formoterol   2 puff Inhalation BID   [START ON 02/29/2024] carvedilol   6.25 mg Oral BID   loratadine   10 mg Oral Daily   rosuvastatin   20 mg Oral Daily   [START ON 02/29/2024] sacubitril -valsartan   1 tablet Oral BID   spironolactone   25 mg Oral Daily   vitamin B-12  2,500 mcg Oral Daily    Continuous Infusions:     LOS: 0 days     Trenda Mar, MD,  FACP, [facility], [facility], [facility]   [facility]  Hospitalist & Physician Advisor [place]      To  contact the attending provider between 7A-7P or the covering provider during after hours 7P-7A, please log into the web site [email or web address] and access using universal [place] password for that web site. If you do not have the password, please call the hospital operator.  02/28/2024, 3:34 PM

## 2024-02-28 NOTE — Care Management Obs Status (Signed)
 MEDICARE OBSERVATION STATUS NOTIFICATION   Patient Details  Name: Patricia Fischer MRN: [id number] Date of Birth: 06/22/1956   Medicare Observation Status Notification Given:       Vonzell Arrie Sharps 02/28/2024, 9:00 AM

## 2024-02-28 NOTE — Evaluation (Signed)
 Physical Therapy Evaluation Patient Details Name: Patricia Fischer MRN: [id number] DOB: Apr 03, 1956 Today's Date: 02/28/2024  History of Present Illness  Pt is a 68 y/o F admitted on 02/26/24 after presenting for scheduled CRT-D implantation. Pt is being treated for hypoxia, presumed flash pulmonary edema. PMH: COPD, HTN, HFrEF, LBBB, prior PE, s/p CRT-D on 6/23  Clinical Impression  Pt seen for PT evaluation with pt agreeable to tx. Pt reports prior to admission she was living alone, driving, ambulatory without AD but plans to d/c to her son's house. On this date, pt is able to transfer sit>stand with supervision<>CGA, recliner>bed with CGA without AD. Gait limited as pt with c/o lightheadedness & pt unable to tolerate standing long enough to get a BP. Pt would benefit from ongoing PT treatment to assess gait when BP improves. MD & nurse notified of pt's BP during session. Pt able to recall post op LUE restrictions.  BP in RUE: Sitting in recliner: 89/54 mmHg MAP 65 Attempted standing BP but had to sit, 90/55 mmHg MAP 55 In bed: 88/62 mmHg MAP 72        If plan is discharge home, recommend the following: A little help with walking and/or transfers;A little help with bathing/dressing/bathroom;Assistance with cooking/housework;Assist for transportation;Help with stairs or ramp for entrance   Can travel by private vehicle        Equipment Recommendations None recommended by PT  Recommendations for Other Services       Functional Status Assessment Patient has had a recent decline in their functional status and demonstrates the ability to make significant improvements in function in a reasonable and predictable amount of time.     Precautions / Restrictions Precautions Precautions: Fall;ICD/Pacemaker Restrictions Weight Bearing Restrictions Per Provider Order: Yes      Mobility  Bed Mobility Overal bed mobility: Needs Assistance Bed Mobility: Sit to Supine       Sit to supine:  Supervision, HOB elevated (extra time to elevate BLE onto bed)        Transfers Overall transfer level: Needs assistance Equipment used: None Transfers: Sit to/from Stand, Bed to chair/wheelchair/BSC Sit to Stand: Supervision, Contact guard assist   Step pivot transfers: Contact guard assist (recliner>bed on L)            Ambulation/Gait                  Stairs            Wheelchair Mobility     Tilt Bed    Modified Rankin (Stroke Patients Only)       Balance Overall balance assessment: Needs assistance Sitting-balance support: Feet supported Sitting balance-Leahy Scale: Good     Standing balance support: During functional activity, No upper extremity supported Standing balance-Leahy Scale: Fair                               Pertinent Vitals/Pain Pain Assessment Pain Assessment: Faces Faces Pain Scale: Hurts even more Pain Location: L sided HA, incision site soreness Pain Descriptors / Indicators: Headache, Sore Pain Intervention(s): Monitored during session (made MD & nurse aware)    Home Living Family/patient expects to be discharged to:: Private residence Living Arrangements: Alone Available Help at Discharge: Family (plans to d/c to son's house (he lives next door), info below is for her son's house) Type of Home: House Home Access: Stairs to enter   [employer] of Steps: 1 threshold step  Home Layout: One level        Prior Function               Mobility Comments: driving, independent without AD, denies falls ADLs Comments: Independent with bathing, dressing, cooking, cleaning. Likes going out to eat with friends.     Extremity/Trunk Assessment   Upper Extremity Assessment Upper Extremity Assessment: Overall WFL for tasks assessed    Lower Extremity Assessment Lower Extremity Assessment: Overall WFL for tasks assessed    Cervical / Trunk Assessment Cervical / Trunk Assessment:  (pt reports  she feels incision site is getting bigger - nurse & MD aware)  Communication        Cognition Arousal: Alert Behavior During Therapy: WFL for tasks assessed/performed   PT - Cognitive impairments: No apparent impairments                         Following commands: Intact       Cueing Cueing Techniques: Verbal cues     General Comments General comments (skin integrity, edema, etc.): Pt on room air throughout session, SPO2 stable throughout.    Exercises     Assessment/Plan    PT Assessment Patient needs continued PT services  PT Problem List Decreased strength;Pain;Cardiopulmonary status limiting activity;Decreased range of motion;Decreased activity tolerance;Decreased knowledge of use of DME;Decreased balance;Decreased mobility       PT Treatment Interventions DME instruction;Balance training;Gait training;Neuromuscular re-education;Stair training;Functional mobility training;Therapeutic exercise;Therapeutic activities;Patient/family education    PT Goals (Current goals can be found in the Care Plan section)  Acute Rehab PT Goals Patient Stated Goal: get better PT Goal Formulation: With patient Time For Goal Achievement: 03/13/24 Potential to Achieve Goals: Good    Frequency Min 2X/week     Co-evaluation               AM-PAC PT 6 Clicks Mobility  Outcome Measure Help needed turning from your back to your side while in a flat bed without using bedrails?: None Help needed moving from lying on your back to sitting on the side of a flat bed without using bedrails?: A Little Help needed moving to and from a bed to a chair (including a wheelchair)?: A Little Help needed standing up from a chair using your arms (e.g., wheelchair or bedside chair)?: A Little Help needed to walk in hospital room?: A Little Help needed climbing 3-5 steps with a railing? : A Little 6 Click Score: 19    End of Session   Activity Tolerance: Treatment limited secondary  to medical complications (Comment) Patient left: in bed;with call bell/phone within reach;with bed alarm set   PT Visit Diagnosis: Muscle weakness (generalized) (M62.81);Other abnormalities of gait and mobility (R26.89);Difficulty in walking, not elsewhere classified (R26.2)    Time: 8561-8547 PT Time Calculation (min) (ACUTE ONLY): 14 min   Charges:   PT Evaluation $PT Eval Moderate Complexity: 1 Mod   PT General Charges $$ ACUTE PT VISIT: 1 Visit         Richerd Pinal, PT, DPT 02/28/24, 3:02 PM   Richerd CHRISTELLA Pinal 02/28/2024, 2:59 PM

## 2024-02-28 NOTE — Plan of Care (Signed)

## 2024-02-29 DIAGNOSIS — D696 Thrombocytopenia, unspecified: Secondary | ICD-10-CM | POA: Diagnosis not present

## 2024-02-29 DIAGNOSIS — I110 Hypertensive heart disease with heart failure: Secondary | ICD-10-CM | POA: Diagnosis not present

## 2024-02-29 DIAGNOSIS — R0902 Hypoxemia: Secondary | ICD-10-CM | POA: Diagnosis not present

## 2024-02-29 DIAGNOSIS — I471 Supraventricular tachycardia, unspecified: Secondary | ICD-10-CM

## 2024-02-29 DIAGNOSIS — I5022 Chronic systolic (congestive) heart failure: Secondary | ICD-10-CM | POA: Diagnosis not present

## 2024-02-29 LAB — CBC WITH DIFFERENTIAL/PLATELET
Abs Immature Granulocytes: 0.05 10*3/uL (ref 0.00–0.07)
Basophils Absolute: 0 10*3/uL (ref 0.0–0.1)
Basophils Relative: 0 %
Eosinophils Absolute: 0 10*3/uL (ref 0.0–0.5)
Eosinophils Relative: 0 %
HCT: 42.8 % (ref 36.0–46.0)
Hemoglobin: 14 g/dL (ref 12.0–15.0)
Immature Granulocytes: 0 %
Lymphocytes Relative: 19 %
Lymphs Abs: 2.3 10*3/uL (ref 0.7–4.0)
MCH: 30.1 pg (ref 26.0–34.0)
MCHC: 32.7 g/dL (ref 30.0–36.0)
MCV: 92 fL (ref 80.0–100.0)
Monocytes Absolute: 1.4 10*3/uL — ABNORMAL HIGH (ref 0.1–1.0)
Monocytes Relative: 12 %
Neutro Abs: 8.2 10*3/uL — ABNORMAL HIGH (ref 1.7–7.7)
Neutrophils Relative %: 69 %
Platelets: 89 10*3/uL — ABNORMAL LOW (ref 150–400)
RBC: 4.65 MIL/uL (ref 3.87–5.11)
RDW: 13.7 % (ref 11.5–15.5)
WBC: 12 10*3/uL — ABNORMAL HIGH (ref 4.0–10.5)
nRBC: 0 % (ref 0.0–0.2)

## 2024-02-29 LAB — CBC
HCT: 41 % (ref 36.0–46.0)
Hemoglobin: 13.5 g/dL (ref 12.0–15.0)
MCH: 30.4 pg (ref 26.0–34.0)
MCHC: 32.9 g/dL (ref 30.0–36.0)
MCV: 92.3 fL (ref 80.0–100.0)
Platelets: 81 10*3/uL — ABNORMAL LOW (ref 150–400)
RBC: 4.44 MIL/uL (ref 3.87–5.11)
RDW: 13.6 % (ref 11.5–15.5)
WBC: 11.5 10*3/uL — ABNORMAL HIGH (ref 4.0–10.5)
nRBC: 0 % (ref 0.0–0.2)

## 2024-02-29 LAB — BASIC METABOLIC PANEL WITH GFR
Anion gap: 8 (ref 5–15)
BUN: 22 mg/dL (ref 8–23)
CO2: 34 mmol/L — ABNORMAL HIGH (ref 22–32)
Calcium: 9.2 mg/dL (ref 8.9–10.3)
Chloride: 94 mmol/L — ABNORMAL LOW (ref 98–111)
Creatinine, Ser: 0.9 mg/dL (ref 0.44–1.00)
GFR, Estimated: >60 mL/min (ref >60)
Glucose, Bld: 100 mg/dL — ABNORMAL HIGH (ref 70–99)
Potassium: 4.2 mmol/L (ref 3.5–5.1)
Sodium: 136 mmol/L (ref 135–145)

## 2024-02-29 MED ORDER — METOPROLOL SUCCINATE ER 50 MG PO TB24
50.0000 mg | ORAL_TABLET | Freq: Two times a day (BID) | ORAL | Status: DC
  Administered 2024-02-29: 50 mg via ORAL
  Filled 2024-02-29: qty 1

## 2024-02-29 MED ORDER — METOPROLOL SUCCINATE ER 50 MG PO TB24: 50.0000 mg | ORAL_TABLET | Freq: Two times a day (BID) | ORAL | 6 refills | Status: DC

## 2024-02-29 NOTE — Progress Notes (Signed)
 PROGRESS NOTE   Patricia Fischer  FMW:[id number]    DOB: 30-Sep-1955    DOA: 02/26/2024  PCP: Leavy Mole, PA-C   I have briefly reviewed patients previous medical records in [facility].   Brief Hospital Course:  68 year old female with medical history significant for COPD reported due to secondhand smoke inhalation, HTN, chronic HFrEF, LBBB, prior PE (reports 27 clots in 2016) on long-term Xarelto  (on hold since 6/20 for procedure) admitted and underwent elective placement of CRT-D on 6/23, TRH were consulted for assistance on 6/24 due to hypoxia.  Patient has clinically improved, hypoxia resolved and plan is for discharge today by the primary team.   Assessment & Plan:   Acute on suspected chronic hypoxia Does not meet criteria for respiratory failure in the absence of dyspnea, sustained tachypnea. 6/24, on ambulation, desaturated to 82% on room air and placed on [place] oxygen  D-dimer positive, >20, followed up with CTA chest which is negative for PE but shows basal consolidation but in the absence of symptoms i.e. productive cough, dyspnea, fever, leukocytosis, low index of suspicion for pneumonia. Suspect that she has chronic hypoxia that is multifactorial related to COPD, possible OSA/OHS, some element of fluid overload, atelectasis, recurrent pneumonia (recent pneumonia a month prior) and acute bronchitis episodes and anesthesia for procedure. Diuresis as per primary team.  Incentive spirometry.  Home O2 evaluation of discharge-did not qualify for home oxygen . Patient reports that she has an upcoming appointment with pulmonology on 7/28 and is advised to keep that.  Patient however refuses to have sleep study done.  Had a long discussion with her today about and advised her to try the sleep study.  She was hesitant because her sister is on CPAP and sleeps worse than she did before starting CPAP.  Advised her that there are some newer devices out there which are much more comfortable  than in the prior years.  She seemed agreeable to get the CPAP done.  COPD Per patient report, due to secondhand smoking Remotely did PFTs it appears.  Has an upcoming follow-up new appointment to see pulmonology on 7/28 No clinical bronchospasm Flutter valve  Suspected OSA/OHS Patient reports history of snoring. Never been tested and patient refuses to have sleep study done.  See discussion above  Chronic systolic CHF Management per primary service.  They gave her a dose of the Lasix  on 6/25.  She then had hypotension with BP as low as 74/51 but monitored and rebounded back to normal.  Clinically euvolemic.  History of PE/DVT Patient on Xarelto  PTA which was held on 6/20 with plans to resume 6/26 as per cardiology note from yesterday CTA chest negative for PE.  Thrombocytopenia Her platelet count on 6/12 was 143, now down to 81.  Unclear etiology.  Suggest repeating 1 more time prior to discharge to ensure stability and then close outpatient follow-up.  Body mass index is 39.71 kg/m./Obesity Complicates care.  Outpatient follow-up.   DVT prophylaxis: SCDs Start: 02/26/24 1549     Code Status: Full Code:  Family Communication: None at bedside Disposition:  As per discussion with primary team, plans for discharge home today.     Consultants:   TRH are consultants  Procedures:   As noted above  Subjective:  Reports chronic DOE which has been unchanged since hospital admission.  Pacemaker pocket insertion pain is better.  No other complaints reported.  Objective:   Vitals:   02/29/24 0300 02/29/24 0336 02/29/24 0441 02/29/24 1016  BP:  133/80  (!) 117/52  Pulse:  94  (!) 107  Resp:  18    Temp:  98 F (36.7 C)    TempSrc:  Oral    SpO2: 92% 96% 96%   Weight:      Height:        General exam: Middle-age female, moderately built and obese up comfortably in reclining chair without distress. Respiratory system: Clear to auscultation.  No increased work of breathing.   On room air. Cardiovascular system: S1 & S2 heard, RRR. No JVD, murmurs, rubs, gallops or clicks. No pedal edema.  Telemetry personally reviewed: V paced rhythm. Gastrointestinal system: Abdomen is nondistended, soft and nontender. No organomegaly or masses felt. Normal bowel sounds heard. Central nervous system: Alert and oriented. No focal neurological deficits. Extremities: Symmetric 5 x 5 power. Skin: No rashes, lesions or ulcers Psychiatry: Judgement and insight appear normal. Mood & affect appropriate.     Data Reviewed:   I have personally reviewed following labs and imaging studies   CBC: Recent Labs  Lab 02/29/24 0456  WBC 11.5*  HGB 13.5  HCT 41.0  MCV 92.3  PLT 81*    Basic Metabolic Panel: Recent Labs  Lab 02/28/24 0437 02/29/24 0456  NA 137 136  K 4.9 4.2  CL 96* 94*  CO2 36* 34*  GLUCOSE 112* 100*  BUN 13 22  CREATININE 0.83 0.90  CALCIUM  9.3 9.2  MG 1.8  --     Liver Function Tests: No results for input(s): AST, ALT, ALKPHOS, BILITOT, PROT, ALBUMIN  in the last 168 hours.  CBG: No results for input(s): GLUCAP in the last 168 hours.  Microbiology Studies:  No results found for this or any previous visit (from the past 240 hours).  Radiology Studies:  CT Angio Chest Pulmonary Embolism (PE) W or WO Contrast Result Date: 02/28/2024 CLINICAL DATA:  Positive D-dimer, low/intermediate probability PE suspected, shortness of breath EXAM: CT ANGIOGRAPHY CHEST WITH CONTRAST TECHNIQUE: Multidetector CT imaging of the chest was performed using the standard protocol during bolus administration of intravenous contrast. Multiplanar CT image reconstructions and MIPs were obtained to evaluate the vascular anatomy. RADIATION DOSE REDUCTION: This exam was performed according to the departmental dose-optimization program which includes automated exposure control, adjustment of the mA and/or kV according to patient size and/or use of iterative reconstruction  technique. CONTRAST:  75mL OMNIPAQUE  IOHEXOL  350 MG/ML SOLN COMPARISON:  10/27/2015 FINDINGS: Cardiovascular: Interval placement of left subclavian AICD. SVC remains patent. Heart size normal. No pericardial effusion. The RV is nondilated. Satisfactory opacification of pulmonary arteries noted, and there is no evidence of pulmonary emboli. Adequate contrast opacification of the thoracic aorta with no evidence of dissection, aneurysm, or stenosis. There is classic 3-vessel brachiocephalic arch anatomy without proximal stenosis. Minimal calcified plaque in the arch and descending thoracic aorta Mediastinum/Nodes: No hematoma, mass, or adenopathy. Lungs/Pleura: No pleural effusion. No pneumothorax. Dependent consolidation posteriorly in both lungs, new since previous. Upper Abdomen: Cholecystectomy clips.  No acute findings. Musculoskeletal: Obesity. Cervical fixation hardware partially visualized. Vertebral endplate spurring at multiple levels in the lower thoracic spine. Review of the MIP images confirms the above findings. IMPRESSION: 1. Negative for acute PE or thoracic aortic dissection. 2. Dependent consolidation posteriorly in both lungs, new since previous. 3.  Aortic Atherosclerosis (ICD10-I70.0). Electronically Signed   By: JONETTA Faes M.D.   On: 02/28/2024 13:41    Scheduled Meds:    ascorbic acid  500 mg Oral Daily   budesonide -glycopyrrolate -formoterol   2 puff  Inhalation BID   loratadine   10 mg Oral Daily   metoprolol  succinate  50 mg Oral BID   rosuvastatin   20 mg Oral Daily   sacubitril -valsartan   1 tablet Oral BID   spironolactone   25 mg Oral Daily   vitamin B-12  2,500 mcg Oral Daily    Continuous Infusions:     LOS: 0 days     Trenda Mar, MD,  FACP, [facility], [facility], [facility]   [facility]  Hospitalist & Physician Advisor [place]      To contact the attending provider between 7A-7P or the covering provider during after hours 7P-7A, please log into the web site  [email or web address] and access using universal [place] password for that web site. If you do not have the password, please call the hospital operator.  02/29/2024, 10:33 AM

## 2024-02-29 NOTE — Progress Notes (Signed)
 DISCHARGE NOTE HOME Patricia Fischer to be discharged Home per MD order. Discussed prescriptions and follow up appointments with the patient. Prescriptions given to patient; medication list explained in detail. Patient verbalized understanding.  Skin clean, dry and intact without evidence of skin break down, no evidence of skin tears noted. IV catheter discontinued intact. Site without signs and symptoms of complications. Dressing and pressure applied. Pt denies pain at the site currently. No complaints noted.  Discharging with left incision where ICD placed. Patient free of other lines, drains, and wounds.   An After Visit Summary (AVS) was printed and given to the patient. Patient escorted via wheelchair, and discharged home via private auto.  [name], RN

## 2024-02-29 NOTE — Progress Notes (Signed)
 Physical Therapy Treatment Patient Details Name: Patricia Fischer MRN: [id number] DOB: [birth year] Today's Date: 02/29/2024   History of Present Illness Pt is a 68 y/o F admitted on 02/26/24 after presenting for scheduled CRT-D implantation. Pt is being treated for hypoxia, presumed flash pulmonary edema. PMH: COPD, HTN, HFrEF, LBBB, prior PE, s/p CRT-D on 6/23    PT Comments  Patient saturating 85% on RA supine in bed with HOB elevated on arrival. Stated she didn't feel like she could take a deep breath in this position. BP 111/68; transitioned to sit EOB denied dizziness and sats 95%. Ambulated 150 ft with supervision (has antalgic gait due to knee pain with no loss of balance) with sats >91% on room air. HR 112-129 with ambulation. Patient plans to stay at her son's upon discharge. From PT perspective, ok to discharge.    If plan is discharge home, recommend the following: A little help with bathing/dressing/bathroom;Assistance with cooking/housework;Assist for transportation;Help with stairs or ramp for entrance   Can travel by private vehicle        Equipment Recommendations  None recommended by PT    Recommendations for Other Services       Precautions / Restrictions Precautions Precautions: Fall;ICD/Pacemaker Recall of Precautions/Restrictions: Intact Restrictions Weight Bearing Restrictions Per Provider Order: Yes     Mobility  Bed Mobility Overal bed mobility: Needs Assistance Bed Mobility: Supine to Sit     Supine to sit: Min assist, Used rails     General bed mobility comments: Pt thinks she will sleep in son's recliner at home since she cannot use LUE to help her get OOB    Transfers Overall transfer level: Needs assistance Equipment used: None Transfers: Sit to/from Stand, Bed to chair/wheelchair/BSC Sit to Stand: Independent                Ambulation/Gait Ambulation/Gait assistance: Contact guard assist, Supervision Gait Distance (Feet): 150  Feet Assistive device: None Gait Pattern/deviations: Step-through pattern, Decreased stride length, Antalgic   Gait velocity interpretation: 1.31 - 2.62 ft/sec, indicative of limited community ambulator   General Gait Details: initially wanting to hold rail x 10 ft and then felt ok to walk without it. She has left knee pain resulting in antalgic gait.   Stairs             Wheelchair Mobility     Tilt Bed    Modified Rankin (Stroke Patients Only)       Balance Overall balance assessment: Needs assistance Sitting-balance support: Feet supported Sitting balance-Leahy Scale: Good     Standing balance support: During functional activity, No upper extremity supported Standing balance-Leahy Scale: Fair                              Hotel manager: No apparent difficulties  Cognition Arousal: Alert Behavior During Therapy: WFL for tasks assessed/performed   PT - Cognitive impairments: No apparent impairments                         Following commands: Intact      Cueing Cueing Techniques: Verbal cues  Exercises      General Comments General comments (skin integrity, edema, etc.): BP 111/68 (82); on RA sats 85% in bed; reports she feels she cannot take a deep breath the way she is positioned; upon sit EOB incr 95%, during gait >91%; HR 112-129; no dizziness throughout  Pertinent Vitals/Pain Pain Assessment Pain Assessment: No/denies pain    Home Living Family/patient expects to be discharged to:: Private residence Living Arrangements: Alone Available Help at Discharge: Family (plans to d/c to son's house (he lives next door), info below is for her son's house) Type of Home: House Home Access: Stairs to enter   [employer] of Steps: 1 threshold step   Home Layout: One level        Prior Function            PT Goals (current goals can now be found in the care plan section) Acute Rehab PT  Goals Patient Stated Goal: get better PT Goal Formulation: With patient Time For Goal Achievement: 03/13/24 Potential to Achieve Goals: Good Progress towards PT goals: Progressing toward goals    Frequency    Min 2X/week      PT Plan      Co-evaluation              AM-PAC PT 6 Clicks Mobility   Outcome Measure  Help needed turning from your back to your side while in a flat bed without using bedrails?: None Help needed moving from lying on your back to sitting on the side of a flat bed without using bedrails?: A Little Help needed moving to and from a bed to a chair (including a wheelchair)?: None Help needed standing up from a chair using your arms (e.g., wheelchair or bedside chair)?: None Help needed to walk in hospital room?: A Little Help needed climbing 3-5 steps with a railing? : A Little 6 Click Score: 21    End of Session Equipment Utilized During Treatment: Gait belt Activity Tolerance: Patient tolerated treatment well Patient left: with call bell/phone within reach;in chair;with nursing/sitter in room Nurse Communication: Mobility status;Other (comment) (ok for dc per PT perspective) PT Visit Diagnosis: Muscle weakness (generalized) (M62.81);Other abnormalities of gait and mobility (R26.89);Difficulty in walking, not elsewhere classified (R26.2)     Time: 9179-9155 PT Time Calculation (min) (ACUTE ONLY): 24 min  Charges:    $Gait Training: 23-37 mins PT General Charges $$ ACUTE PT VISIT: 1 Visit                      Macario RAMAN, PT Acute Rehabilitation Services  Office [phone]    Macario SHAUNNA Soja 02/29/2024, 8:58 AM

## 2024-02-29 NOTE — Plan of Care (Signed)

## 2024-02-29 NOTE — Discharge Instructions (Addendum)
 After Your ICD (Implantable Cardiac Defibrillator)   You have a Medtronic ICD  ACTIVITY Do not lift your arm above shoulder height for 1 week after your procedure. After 7 days, you may progress as below.  You should remove your sling 24 hours after your procedure, unless otherwise instructed by your provider.     Thursday March 07, 2024  Friday March 08, 2024 Saturday March 09, 2024 Sunday March 10, 2024   Do not lift, push, pull, or carry anything over 10 pounds with the affected arm until 6 weeks (Thursday April 11, 2024 ) after your procedure.   You may drive AFTER your wound check, unless you have been told otherwise by your provider.   Ask your healthcare provider when you can go back to work   INCISION/Dressing Ok to resume your Xarelto  tonight (02/29/24) on your normal schedule.    Monitor your defibrillator site for redness, swelling, and drainage. Call the device clinic at [phone] if you experience these symptoms or fever/chills.  If your incision is sealed with Steri-strips or staples, you may shower 7 days after your procedure or when told by your provider. Do not remove the steri-strips or let the shower hit directly on your site. You may wash around your site with soap and water.    If you were discharged in a sling, please do not wear this during the day more than 48 hours after your surgery unless otherwise instructed. This may increase the risk of stiffness and soreness in your shoulder.   Avoid lotions, ointments, or perfumes over your incision until it is well-healed.  You may use a hot tub or a pool AFTER your wound check appointment if the incision is completely closed.  Your ICD is designed to protect you from life threatening heart rhythms. Because of this, you may receive a shock.   1 shock with no symptoms:  Call the office during business hours. 1 shock with symptoms (chest pain, chest pressure, dizziness, lightheadedness, shortness of breath, overall  feeling unwell):  Call 911. If you experience 2 or more shocks in 24 hours:  Call 911. If you receive a shock, you should not drive for 6 months per the [place] DMV IF you receive appropriate therapy from your ICD.   ICD Alerts:  Some alerts are vibratory and others beep. These are NOT emergencies. Please call our office to let us  know. If this occurs at night or on weekends, it can wait until the next business day. Send a remote transmission.  If your device is capable of reading fluid status (for heart failure), you will be offered monthly monitoring to review this with you.   DEVICE MANAGEMENT Remote monitoring is used to monitor your ICD from home. This monitoring is scheduled every 91 days by our office. It allows us  to keep an eye on the functioning of your device to ensure it is working properly. You will routinely see your Electrophysiologist annually (more often if necessary).   You should receive your ID card for your new device in 4-8 weeks. Keep this card with you at all times once received. Consider wearing a medical alert bracelet or necklace.  Your ICD  may be MRI compatible. This will be discussed at your next office visit/wound check.  You should avoid contact with strong electric or magnetic fields.   Do not use amateur (ham) radio equipment or electric (arc) welding torches. MP3 player headphones with magnets should not be used. Some devices are safe to  use if held at least 12 inches (30 cm) from your defibrillator. These include power tools, lawn mowers, and speakers. If you are unsure if something is safe to use, ask your health care provider.  When using your cell phone, hold it to the ear that is on the opposite side from the defibrillator. Do not leave your cell phone in a pocket over the defibrillator.  You may safely use electric blankets, heating pads, computers, and microwave ovens.  Call the office right away if: You have chest pain. You feel more than one shock. You  feel more short of breath than you have felt before. You feel more light-headed than you have felt before. Your incision starts to open up.  This information is not intended to replace advice given to you by your health care provider. Make sure you discuss any questions you have with your health care provider.

## 2024-02-29 NOTE — Progress Notes (Signed)
 SATURATION QUALIFICATIONS: (This note is used to comply with regulatory documentation for home oxygen )  Patient Saturations on Room Air at Rest = 95%  Patient Saturations on Room Air while Ambulating = 91%  Patient Saturations on -- Liters of oxygen  while Ambulating = --%  Please briefly explain why patient needs home oxygen :  No need for O2 while ambulating.   Macario RAMAN, PT Acute Rehabilitation Services  Office [phone]

## 2024-03-01 ENCOUNTER — Telehealth: Payer: Self-pay

## 2024-03-01 NOTE — Telephone Encounter (Signed)
 Follow-up after same day discharge: Implant date: 02/26/2024 MD: Fonda Kitty Device: Medtronic ICD  Location: Left Chest   Wound check visit: 03/12/2024 90 day MD follow-up: 05/28/2024  Remote Transmission received:02/29/2024  Dressing/sling removed: dressing might be removed  Confirm OAC restart on: pt not on an OAC  Please continue to monitor your cardiac device site for redness, swelling, and drainage. Call the device clinic at [phone] if you experience these symptoms, fever/chills, or have questions about your device.   Remote monitoring is used to monitor your cardiac device from home. This monitoring is scheduled every 91 days by our office. It allows us  to keep an eye on the functioning of your device to ensure it is working properly.

## 2024-03-11 ENCOUNTER — Other Ambulatory Visit: Payer: Self-pay | Admitting: Cardiology

## 2024-03-12 ENCOUNTER — Ambulatory Visit: Attending: Cardiology

## 2024-03-12 VITALS — BP 112/67

## 2024-03-12 DIAGNOSIS — I5020 Unspecified systolic (congestive) heart failure: Secondary | ICD-10-CM

## 2024-03-12 DIAGNOSIS — I447 Left bundle-branch block, unspecified: Secondary | ICD-10-CM | POA: Diagnosis not present

## 2024-03-12 LAB — CUP PACEART INCLINIC DEVICE CHECK
Date Time Interrogation Session: 20250708134210
Implantable Lead Connection Status: 753985
Implantable Lead Connection Status: 753985
Implantable Lead Connection Status: 753985
Implantable Lead Implant Date: 20250623
Implantable Lead Implant Date: 20250623
Implantable Lead Implant Date: 20250623
Implantable Lead Location: 753858
Implantable Lead Location: 753859
Implantable Lead Location: 753860
Implantable Lead Model: 4598
Implantable Lead Model: 5076
Implantable Pulse Generator Implant Date: 20250623

## 2024-03-12 MED ORDER — CARVEDILOL 6.25 MG PO TABS: 3.1250 mg | ORAL_TABLET | Freq: Two times a day (BID) | ORAL | 3 refills | Status: AC

## 2024-03-12 NOTE — Patient Instructions (Addendum)
 After Your ICD (Implantable Cardiac Defibrillator)   STOP taking metoprolol  succinate Start taking carvedilol  6.25 mg- Take 1/2 tablet by mouth twice a day Keep a log of your blood pressures for 1 week After 1 week send Glendia Ferrier, PA the list of your blood pressures    Monitor your defibrillator site for redness, swelling, and drainage. Call the device clinic at [phone] if you experience these symptoms or fever/chills.  Your incision was closed with Steri-strips or staples:  You may shower 7 days after your procedure and wash your incision with soap and water. Avoid lotions, ointments, or perfumes over your incision until it is well-healed.  You may use a hot tub or a pool after your wound check appointment if the incision is completely closed.  Do not lift, push or pull greater than 10 pounds with the affected arm until 6 weeks after your procedure. There are no other restrictions in arm movement after your wound check appointment.  Your ICD is designed to protect you from life threatening heart rhythms. Because of this, you may receive a shock.   1 shock with no symptoms:  Call the office during business hours. 1 shock with symptoms (chest pain, chest pressure, dizziness, lightheadedness, shortness of breath, overall feeling unwell):  Call 911. If you experience 2 or more shocks in 24 hours:  Call 911. If you receive a shock, you should not drive.  [place] DMV - no driving for 6 months if you receive appropriate therapy from your ICD.   ICD Alerts:  Some alerts are vibratory and others beep. These are NOT emergencies. Please call our office to let us  know. If this occurs at night or on weekends, it can wait until the next business day. Send a remote transmission.  If your device is capable of reading fluid status (for heart failure), you will be offered monthly monitoring to review this with you.   Remote monitoring is used to monitor your ICD from home. This monitoring is  scheduled every 91 days by our office. It allows us  to keep an eye on the functioning of your device to ensure it is working properly. You will routinely see your Electrophysiologist annually (more often if necessary).

## 2024-03-12 NOTE — Progress Notes (Signed)
 Normal multi-chamber chamber ICD wound check. Wound well healed. Presenting rhythm: AS/BV 90 . Routine testing performed. Thresholds, sensing, and impedances consistent with implant measurements with 3.5V safety margin/auto capture until 3 month visit. No treated arrhythmias. Reviewed arm restrictions to continue for 6 weeks total post op. Reviewed shock plan.  Pt enrolled in remote follow-up.  During device check Pt became tearful and states her anxiety/PTSD has been increased since her discharge.  Pt's medication was changed during last hospitalization from carvedilol  to Toprol .  Discussed with Glendia Ferrier, PA.  Will have Pt discontinue Toprol  and restart carvedilol  6.25 mg at 1/2 tablet by mouth twice a day.  Will have Pt continue to monitor BP's and keep a log.  She will send this information to [facility] via MyChart in one week.    Pt's BIV pacing percentage 88.8%.  Will continue to follow.

## 2024-03-24 ENCOUNTER — Ambulatory Visit: Payer: Self-pay | Admitting: Cardiology

## 2024-04-02 ENCOUNTER — Ambulatory Visit: Admitting: Pulmonary Disease

## 2024-04-04 ENCOUNTER — Ambulatory Visit: Payer: Self-pay

## 2024-04-04 ENCOUNTER — Ambulatory Visit: Payer: Medicare Other

## 2024-04-04 DIAGNOSIS — Z0000 Encounter for general adult medical examination without abnormal findings: Secondary | ICD-10-CM

## 2024-04-04 NOTE — Telephone Encounter (Signed)
 FYI Only or Action Required?: Action required by provider: request for appointment.  Patient was last seen in primary care on 10/13/2021 by Bernardo Fend, DO.  Called Nurse Triage reporting Anxiety.  Symptoms began about a month ago.  Interventions attempted: Rest, hydration, or home remedies.  Symptoms are: gradually worsening.  Triage Disposition: See Physician Within 24 Hours  Patient/caregiver understands and will follow disposition?: YesCopied from CRM #[id number]. Topic: Clinical - Red Word Triage >> Apr 04, 2024  2:06 PM Santiya F wrote: Red Word that prompted transfer to Nurse Triage: Anxiety. Patient is calling in wanting to schedule an appointment. Patient says her anxiety is very bad. Patient is also crying off and on during the call. She had an AWV today, but says she needs to be seen. Offered an appointment with Dr. Glenard and patient declined. Reason for Disposition  Patient sounds very upset or troubled to the triager  Answer Assessment - Initial Assessment Questions 1. CONCERN: Did anything happen that prompted you to call today?      No anxiety today but I'm overwhelmed and crying 2. ANXIETY SYMPTOMS: Can you describe how you (your loved one; patient) have been feeling? (e.g., tense, restless, panicky, anxious, keyed up, overwhelmed, sense of impending doom).      Overwhelmed  3. ONSET: How long have you been feeling this way? (e.g., hours, days, weeks)     28-Apr-2021 when husband died but worse since surgery 02/27/24) 4. SEVERITY: How would you rate the level of anxiety? (e.g., 0 - 10; or mild, moderate, severe).     severe 5. FUNCTIONAL IMPAIRMENT: How have these feelings affected your ability to do daily activities? Have you had more difficulty than usual doing your normal daily activities? (e.g., getting better, same, worse; self-care, school, work, interactions)     yes 6. HISTORY: Have you felt this way before? Have you ever been diagnosed with an anxiety  problem in the past? (e.g., generalized anxiety disorder, panic attacks, PTSD). If Yes, ask: How was this problem treated? (e.g., medicines, counseling, etc.)     denies 7. RISK OF HARM - SUICIDAL IDEATION: Do you ever have thoughts of hurting or killing yourself? If Yes, ask:  Do you have these feelings now? Do you have a plan on how you would do this?     denies 8. TREATMENT:  What has been done so far to treat this anxiety? (e.g., medicines, relaxation strategies). What has helped?     Praying, relaxation strategies 9. THERAPIST: Do you have a counselor or therapist? If Yes, ask: What is their name?     Na  10. POTENTIAL TRIGGERS: Do you drink caffeinated beverages (e.g., coffee, colas, teas), and how much daily? Do you drink alcohol or use any drugs? Have you started any new medicines recently?       Not sure 11. PATIENT SUPPORT: Who is with you now? Who do you live with? Do you have family or friends who you can talk to?        friends 12. OTHER SYMPTOMS: Do you have any other symptoms? (e.g., feeling depressed, trouble concentrating, trouble sleeping, trouble breathing, palpitations or fast heartbeat, chest pain, sweating, nausea, or diarrhea)       Scared something is going to happen; when very upset hands will shake   Pt had CRT device placed 6/23 and the medication to treat condition causes anxiety. Pt has had several public instances where anxiety has taken over.  I feel like the walls the are  closing in or my chest is going to pop out of my body. Im not anxious now.Today is a good day. Pt is crying I don't know why I'm crying.I'm not going to hurt myself but I don't like this feeling. Pt denies chest pain/SOB. I just want help. I am upset because I don't know why I feel this way. Pt wanted to see Dr Bernardo. Soonest appt is 8/1.  Protocols used: Anxiety and Panic Attack-A-AH

## 2024-04-04 NOTE — Telephone Encounter (Signed)
 Please schedule her appointment

## 2024-04-04 NOTE — Telephone Encounter (Signed)
 Pt has appt for 04/05/24

## 2024-04-04 NOTE — Progress Notes (Signed)
 Subjective:   Patricia Fischer is a 68 y.o. who presents for a Medicare Wellness preventive visit.  As a reminder, Annual Wellness Visits don't include a physical exam, and some assessments may be limited, especially if this visit is performed virtually. We may recommend an in-person follow-up visit with your provider if needed.  Visit Complete: Virtual I connected with  Patricia Fischer on 04/04/24 by a audio enabled telemedicine application and verified that I am speaking with the correct person using two identifiers.  Patient Location: Home  Provider Location: Home Office  I discussed the limitations of evaluation and management by telemedicine. The patient expressed understanding and agreed to proceed.  Vital Signs: Because this visit was a virtual/telehealth visit, some criteria may be missing or patient reported. Any vitals not documented were not able to be obtained and vitals that have been documented are patient reported.  VideoDeclined- This patient declined Librarian, academic. Therefore the visit was completed with audio only.  Persons Participating in Visit: Patient.  AWV Questionnaire: No: Patient Medicare AWV questionnaire was not completed prior to this visit.  Cardiac Risk Factors include: advanced age (>44men, >1 women);dyslipidemia;hypertension;sedentary lifestyle;obesity (BMI >30kg/m2);smoking/ tobacco exposure     Objective:    Today's Vitals   04/04/24 1325  PainSc: 3    There is no height or weight on file to calculate BMI.     04/04/2024    1:41 PM 02/26/2024    4:07 PM 02/26/2024    8:17 AM 02/17/2024   11:14 AM 11/25/2022    3:38 PM 11/23/2021    4:11 PM 10/01/2021    6:29 PM  Advanced Directives  Does Patient Have a Medical Advance Directive? Yes  Yes Yes No No No  Type of Estate agent of [place];Living will Healthcare Power of [place];Living will Living will;Healthcare Power of [employer] Power of [place];Living will     Does patient want to make changes to medical advance directive? No - Patient declined No - Patient declined       Copy of Healthcare Power of Attorney in Chart? Yes - validated most recent copy scanned in chart (See row information)        Would patient like information on creating a medical advance directive?      No - Patient declined     Current Medications (verified) Outpatient Encounter Medications as of 04/04/2024  Medication Sig   albuterol  (VENTOLIN  HFA) 108 (90 Base) MCG/ACT inhaler TAKE 2 PUFFS BY MOUTH EVERY 6 HOURS AS NEEDED FOR WHEEZE OR SHORTNESS OF BREATH   carvedilol  (COREG ) 6.25 MG tablet Take 0.5 tablets (3.125 mg total) by mouth 2 (two) times daily.   cetirizine (ZYRTEC) 10 MG tablet Take 10 mg by mouth daily.   fluticasone  (FLONASE ) 50 MCG/ACT nasal spray SPRAY 2 SPRAYS INTO EACH NOSTRIL EVERY DAY   Fluticasone -Umeclidin-Vilant (TRELEGY ELLIPTA ) 100-62.5-25 MCG/ACT AEPB Inhale 1 puff into the lungs daily.   furosemide  (LASIX ) 40 MG tablet Take 40 mg by mouth daily as needed for fluid or edema.   gabapentin  (NEURONTIN ) 300 MG capsule Take 300 mg by mouth 3 (three) times daily as needed (nerve pain). (Patient taking differently: Take 600 mg by mouth 3 (three) times daily.)   methocarbamol  (ROBAXIN ) 500 MG tablet Take 500 mg by mouth every 8 (eight) hours as needed for muscle spasms.   rivaroxaban  (XARELTO ) 10 MG TABS tablet Take 1 tablet (10 mg total) by mouth daily.   rosuvastatin  (CRESTOR ) 20  MG tablet Take 1 tablet (20 mg total) by mouth daily.   sacubitril -valsartan  (ENTRESTO ) 24-26 MG Take 1 tablet by mouth 2 (two) times daily.   spironolactone  (ALDACTONE ) 25 MG tablet TAKE 1 TABLET (25 MG TOTAL) BY MOUTH DAILY.   tizanidine  (ZANAFLEX ) 2 MG capsule Take 2 mg by mouth 3 (three) times daily as needed for muscle spasms.   traMADol  (ULTRAM ) 50 MG tablet Take 50 mg by mouth every 4 (four) hours as needed for severe pain (pain score  7-10).   vitamin B-12 (CYANOCOBALAMIN ) 500 MCG tablet Take 2,500 mcg by mouth daily.   vitamin C  (ASCORBIC ACID ) 500 MG tablet Take 500 mg by mouth daily.   Facility-Administered Encounter Medications as of 04/04/2024  Medication   albuterol  (PROVENTIL ) (2.5 MG/3ML) 0.083% nebulizer solution 2.5 mg    Allergies (verified) Gadolinium derivatives, Levaquin  [levofloxacin  in d5w], Sulfa antibiotics, Vancomycin , Abilify  [aripiprazole ], Levofloxacin , Codeine, Oxycodone , and Penicillins   History: Past Medical History:  Diagnosis Date   Allergy    Anxiety    Arthritis    Asthma    Blood transfusion 1979   Chidbirth   Breast cancer, left breast (HCC) 2011   DCIS   Chronic systolic CHF w/ Improved LVF 05/05/2015   NICM // Dx 04/2015 - EF initially 25-30% with RV dysfunction // f/u echo 08/2015 technically difficult, EF 35-40%, anterior, anteroseptal, apical and inferoapical severe hypokinesis suggestive of LAD territory ischemia/infarct, grade 1 DD, mild MR, RV normal. // Echo 5/18: mild LVH, EF 30-35, Gr 1 DD // cMRI 12/18: EF 52    COPD (chronic obstructive pulmonary disease) (HCC)    Depression    DVT (deep venous thrombosis) (HCC) 04/2015   a. Dx 04/2015 - patient returned 05/2015 after noncompliance with Xarelto .   Environmental allergies    Essential hypertension    Family Hx of adverse reaction to anesthesia     My Mother would get deathly sick    Gastroesophageal reflux disease    Head injury 2011   Hemorrhoids    History of cardiac catheterization    Cath 10/2015: oLCx 50 (prob cath induced spasm), no sig CAD, EF 35-45   LBBB (left bundle branch block)    Migraine HAs 11/2014   Obesity    Orthostatic hypotension    Pneumonia 2012   Saddle pulmonary embolus (HCC) 04/2015   a. Dx 04/2015 - patient returned 05/2015 after noncompliance with Xarelto .   Shortness of breath dyspnea    Subscapularis (muscle) sprain 11/22/2013   Syncope    a. Recurrent syncope in 2016 felt 2/2  orthostasis in setting of PE.   Past Surgical History:  Procedure Laterality Date   ABDOMINAL HYSTERECTOMY  1986   ANAL FISSURE REPAIR     ANTERIOR CERVICAL DECOMP/DISCECTOMY FUSION  01/23/2012   Procedure: ANTERIOR CERVICAL DECOMPRESSION/DISCECTOMY FUSION 1 LEVEL;  Surgeon: Arley SHAUNNA Helling, MD;  Location: [facility];  Service: Neurosurgery;  Laterality: N/A;  Cervical five-six anterior cervical discectomy fusion with plating   APPENDECTOMY     BIV ICD INSERTION CRT-D N/A 02/26/2024   Procedure: BIV ICD INSERTION CRT-D;  Surgeon: Kennyth Chew, MD;  Location: [facility] INVASIVE CV LAB;  Service: Cardiovascular;  Laterality: N/A;   BREAST BIOPSY Left 06/2016   US  biopsy-PAPILLARY CYSTIC APOCRINE CHANGE    BREAST SURGERY Left 2011   Breast lumpectomy   CARDIAC CATHETERIZATION  2010   [facility]; pt states it was clear   CARDIAC CATHETERIZATION N/A 10/28/2015   Procedure: Right/Left Heart Cath  and Coronary Angiography;  Surgeon: Victory LELON Sharps, MD;  Location: [facility] INVAS[facility] CV LAB;  Service: Cardiovascular;  Laterality: N/A;   CARPAL TUNNEL RELEASE Bilateral    CESAREAN SECTION  1982, 1979   CHOLECYSTECTOMY     COLONOSCOPY  01/16/2009   Dr Dessa   KNEE CARTILAGE SURGERY Left    LEAD INSERTION N/A 02/26/2024   Procedure: LEAD INSERTION;  Surgeon: Kennyth Chew, MD;  Location: [facility] INVASIVE CV LAB;  Service: Cardiovascular;  Laterality: N/A;   SHOULDER ARTHROSCOPY WITH SUBACROMIAL DECOMPRESSION, ROTATOR CUFF REPAIR AND BICEP TENDON REPAIR Right 11/22/2013   Procedure: RIGHT SHOULDER ATHROSCOPY OPEN SUBSCAP REPAIR DELTA-PECTORAL ;  Surgeon: Elspeth JONELLE Her, MD;  Location: [facility] OR;  Service: Orthopedics;  Laterality: Right;   TARSAL TUNNEL RELEASE Left 1990   TONSILLECTOMY     TUBAL LIGATION     Family History  Problem Relation Age of Onset   Breast cancer Paternal Aunt    Breast cancer Mother 23   Migraines Mother    Cancer Mother    Hypertension Mother    Diabetes Mother    Rectal  cancer Father    Cancer Father    COPD Father    Alcohol abuse Father    Heart disease Paternal Grandmother    Stroke Paternal Grandmother    Heart attack Paternal Grandmother    Aneurysm Paternal Grandfather    COPD Paternal Grandfather    Breast cancer Cousin    Breast cancer Cousin    Breast cancer Cousin    Diabetes Sister    Hearing loss Son    Anesthesia problems Neg Hx    Social History   Socioeconomic History   Marital status: Widowed    Spouse name: Not on file   Number of children: 2   Years of education: 61   Highest education level: Associate degree: occupational, Scientist, product/process development, or vocational program  Occupational History   Occupation: retired  Tobacco Use   Smoking status: Never   Smokeless tobacco: Never  Vaping Use   Vaping status: Never Used  Substance and Sexual Activity   Alcohol use: Not Currently    Comment: occasional wine   Drug use: Never   Sexual activity: Not Currently    Birth control/protection: None  Other Topics Concern   Not on file  Social History Narrative   Not on file   Social Drivers of Health   Financial Resource Strain: Low Risk  (04/04/2024)   Overall Financial Resource Strain (CARDIA)    Difficulty of Paying Living Expenses: Not very hard  Food Insecurity: No Food Insecurity (04/04/2024)   Hunger Vital Sign    Worried About Running Out of Food in the Last Year: Never true    Ran Out of Food in the Last Year: Never true  Transportation Needs: No Transportation Needs (04/04/2024)   PRAPARE - Administrator, Civil Service (Medical): No    Lack of Transportation (Non-Medical): No  Physical Activity: Inactive (04/04/2024)   Exercise Vital Sign    Days of Exercise per Week: 0 days    Minutes of Exercise per Session: 0 min  Stress: No Stress Concern Present (04/04/2024)   [employer] of Occupational Health - Occupational Stress Questionnaire    Feeling of Stress: Only a little  Social Connections: Moderately  Integrated (04/04/2024)   Social Connection and Isolation Panel    Frequency of Communication with Friends and Family: More than three times a week    Frequency of Social Gatherings  with Friends and Family: More than three times a week    Attends Religious Services: More than 4 times per year    Active Member of Clubs or Organizations: Yes    Attends Banker Meetings: More than 4 times per year    Marital Status: Widowed    Tobacco Counseling Counseling given: Not Answered    Clinical Intake:  Pre-visit preparation completed: Yes  Pain : 0-10 Pain Score: 3  Pain Type: Chronic pain Pain Location: Knee Pain Orientation: Left Pain Descriptors / Indicators: Aching, Discomfort, Constant Pain Onset: More than a month ago Pain Frequency: Constant Pain Relieving Factors: shot  Pain Relieving Factors: shot  BMI - recorded: 39 Nutritional Status: BMI > 30  Obese Nutritional Risks: None Diabetes: No  Lab Results  Component Value Date   HGBA1C 6.4 (H) 06/26/2023   HGBA1C 5.6 07/07/2020     How often do you need to have someone help you when you read instructions, pamphlets, or other written materials from your doctor or pharmacy?: 1 - Never  Interpreter Needed?: No  Information entered by :: Patricia DAS, Patricia Fischer   Activities of Daily Living    04/04/2024    1:42 PM 02/26/2024    4:21 PM  In your present state of health, do you have any difficulty performing the following activities:  Hearing? 0 0  Vision? 0 0  Difficulty concentrating or making decisions? 0 0  Walking or climbing stairs? 1   Comment KNEE PAIN   Dressing or bathing? 0   Doing errands, shopping? 0   Preparing Food and eating ? N   Using the Toilet? N   In the past six months, have you accidently leaked urine? N   Do you have problems with loss of bowel control? N   Managing your Medications? N   Managing your Finances? N   Housekeeping or managing your Housekeeping? N     Patient Care  Team: Tapia, Leisa, PA-C as PCP - General (Family Medicine) Jeffrie Oneil BROCKS, MD as PCP - Cardiology (Cardiology) Kennyth Chew, MD as PCP - Electrophysiology (Cardiology) Claudene Victory ORN, MD (Inactive) as Consulting Physician (Cardiology) Bensimhon, Toribio SAUNDERS, MD as Consulting Physician (Cardiology) [facility], [facility]  I have updated your Care Teams any recent Medical Services you may have received from other providers in the past year.     Assessment:   This is a routine wellness examination for Patricia Fischer.  Hearing/Vision screen Hearing Screening - Comments:: NO AIDS Vision Screening - Comments:: READERS- [email or web address]   Goals Addressed             This Visit's Progress    DIET - EAT MORE FRUITS AND VEGETABLES         Depression Screen     04/04/2024    1:31 PM 01/03/2024   11:19 AM 06/26/2023    1:55 PM 03/16/2023   10:20 AM 02/06/2023    1:59 PM 11/25/2022    3:36 PM 11/15/2022    2:19 PM  PHQ 2/9 Scores  PHQ - 2 Score 0 0 0 4 0 0 0  PHQ- 9 Score 0 0 0 10 0 0 0    Fall Risk     04/04/2024    1:42 PM 01/03/2024   11:19 AM 06/26/2023    1:54 PM 03/16/2023   10:20 AM 02/06/2023    1:59 PM  Fall Risk   Falls in the past year? 0 0 0 0 1  Number  falls in past yr: 0 0 0 0 1  Injury with Fall? 0 0 0 0 0  Risk for fall due to : No Fall Risks  No Fall Risks No Fall Risks Impaired balance/gait  Follow up Falls evaluation completed;Falls prevention discussed Falls evaluation completed Falls prevention discussed;Education provided;Falls evaluation completed Falls prevention discussed;Education provided;Falls evaluation completed Falls prevention discussed;Education provided;Falls evaluation completed    MEDICARE RISK AT HOME:  Medicare Risk at Home Any stairs in or around the home?: Yes If so, are there any without handrails?: No Home free of loose throw rugs in walkways, pet beds, electrical cords, etc?: Yes Adequate lighting in your home to reduce risk of falls?: Yes Life  alert?: No Use of a cane, walker or w/c?: No Grab bars in the bathroom?: No Shower chair or bench in shower?: No Elevated toilet seat or a handicapped toilet?: No  TIMED UP AND GO:  Was the test performed?  No  Cognitive Function: 6CIT completed        04/04/2024    1:44 PM 11/25/2022    3:51 PM  6CIT Screen  What Year? 0 points 0 points  What month? 0 points 0 points  What time? 0 points 0 points  Count back from 20 0 points 0 points  Months in reverse 0 points 0 points  Repeat phrase 0 points 2 points  Total Score 0 points 2 points    Immunizations Immunization History  Administered Date(s) Administered   Fluad Trivalent(High Dose 65+) 06/26/2023   Influenza Inj Mdck Quad Pf 06/21/2019   Influenza,inj,Quad PF,6+ Mos 07/01/2015, 05/17/2016, 06/26/2017, 08/08/2018   PNEUMOCOCCAL CONJUGATE-20 02/16/2021   Pneumococcal Polysaccharide-23 07/13/2007   Td 09/05/2004   Tdap 05/17/2016    Screening Tests Health Maintenance  Topic Date Due   Zoster Vaccines- Shingrix (1 of 2) Never done   Colonoscopy  01/16/2014   INFLUENZA VACCINE  04/05/2024   MAMMOGRAM  07/04/2024   Medicare Annual Wellness (AWV)  04/04/2025   DTaP/Tdap/Td (3 - Td or Tdap) 05/17/2026   DEXA SCAN  04/15/2027   Pneumococcal Vaccine: 50+ Years  Completed   Hepatitis C Screening  Completed   Hepatitis B Vaccines  Aged Out   HPV VACCINES  Aged Out   Meningococcal B Vaccine  Aged Out   COVID-19 Vaccine  Discontinued    Health Maintenance  Health Maintenance Due  Topic Date Due   Zoster Vaccines- Shingrix (1 of 2) Never done   Colonoscopy  01/16/2014   Health Maintenance Items Addressed: DECLINES COLONOSCOPY; MAMMOGRAM UP TO DATE;UP TO DATE ON PNA, TDAP- WANTS NO COVID  Additional Screening:  Vision Screening: Recommended annual ophthalmology exams for early detection of glaucoma and other disorders of the eye. Would you like a referral to an eye doctor? No    Dental Screening: Recommended  annual dental exams for proper oral hygiene  Community Resource Referral / Chronic Care Management: CRR required this visit?  No   CCM required this visit?  No   Plan:    I have personally reviewed and noted the following in the patient's chart:   Medical and social history Use of alcohol, tobacco or illicit drugs  Current medications and supplements including opioid prescriptions. Patient is not currently taking opioid prescriptions. Functional ability and status Nutritional status Physical activity Advanced directives List of other physicians Hospitalizations, surgeries, and ER visits in previous 12 months Vitals Screenings to include cognitive, depression, and falls Referrals and appointments  In addition, I have reviewed  and discussed with patient certain preventive protocols, quality metrics, and best practice recommendations. A written personalized care plan for preventive services as well as general preventive health recommendations were provided to patient.   Patricia GORMAN Das, Patricia Fischer   [birth year]   After Visit Summary: (MyChart) Due to this being a telephonic visit, the after visit summary with patients personalized plan was offered to patient via MyChart   Notes: Nothing significant to report at this time.

## 2024-04-04 NOTE — Patient Instructions (Addendum)
 Ms. Patricia Fischer , Thank you for taking time out of your busy schedule to complete your Annual Wellness Visit with me. I enjoyed our conversation and look forward to speaking with you again next year. I, as well as your care team,  appreciate your ongoing commitment to your health goals. Please review the following plan we discussed and let me know if I can assist you in the future.   Follow up Visits: 04/10/25 @ 12:40 PM BY PHONE We will see or speak with you next year for your Next Medicare AWV with our clinical staff Have you seen your provider in the last 6 months (3 months if uncontrolled diabetes)? Yes  Clinician Recommendations:  Aim for 30 minutes of exercise or brisk walking, 6-8 glasses of water, and 5 servings of fruits and vegetables each day. TAKE CARE!      This is a list of the screenings recommended for you:  Health Maintenance  Topic Date Due   Zoster (Shingles) Vaccine (1 of 2) Never done   Colon Cancer Screening  01/16/2014   Flu Shot  04/05/2024   Mammogram  07/04/2024   Medicare Annual Wellness Visit  04/04/2025   DTaP/Tdap/Td vaccine (3 - Td or Tdap) 05/17/2026   DEXA scan (bone density measurement)  04/15/2027   Pneumococcal Vaccine for age over 8  Completed   Hepatitis C Screening  Completed   Hepatitis B Vaccine  Aged Out   HPV Vaccine  Aged Out   Meningitis B Vaccine  Aged Out   COVID-19 Vaccine  Discontinued    Advanced directives: (ACP Link)Information on Advanced Care Planning can be found at [place]  Secretary of [facility] Advance Health Care Directives Advance Health Care Directives. [email or web address]  Advance Care Planning is important because it:  [x]  Makes sure you receive the medical care that is consistent with your values, goals, and preferences  [x]  It provides guidance to your family and loved ones and reduces their decisional burden about whether or not they are making the right decisions based on your wishes.  Follow the link provided in your after  visit summary or read over the paperwork we have mailed to you to help you started getting your Advance Directives in place. If you need assistance in completing these, please reach out to us  so that we can help you!

## 2024-04-05 ENCOUNTER — Encounter: Payer: Self-pay | Admitting: Internal Medicine

## 2024-04-05 ENCOUNTER — Ambulatory Visit (INDEPENDENT_AMBULATORY_CARE_PROVIDER_SITE_OTHER): Admitting: Internal Medicine

## 2024-04-05 ENCOUNTER — Other Ambulatory Visit: Payer: Self-pay

## 2024-04-05 VITALS — BP 122/84 | HR 103 | Temp 98.8°F | Resp 16 | Ht 64.0 in | Wt 239.9 lb

## 2024-04-05 DIAGNOSIS — F419 Anxiety disorder, unspecified: Secondary | ICD-10-CM

## 2024-04-05 MED ORDER — FLUOXETINE HCL 10 MG PO CAPS: 10.0000 mg | ORAL_CAPSULE | Freq: Every day | ORAL | 1 refills | Status: DC

## 2024-04-05 MED ORDER — BUSPIRONE HCL 5 MG PO TABS: 5.0000 mg | ORAL_TABLET | Freq: Two times a day (BID) | ORAL | 1 refills | Status: DC | PRN
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
## 2024-04-09 ENCOUNTER — Telehealth: Payer: Self-pay | Admitting: Pulmonary Disease

## 2024-04-09 NOTE — Telephone Encounter (Signed)
 LVMTCB to schedule pulmonary consult.

## 2024-04-10 ENCOUNTER — Ambulatory Visit (INDEPENDENT_AMBULATORY_CARE_PROVIDER_SITE_OTHER)

## 2024-04-10 DIAGNOSIS — I447 Left bundle-branch block, unspecified: Secondary | ICD-10-CM | POA: Diagnosis not present

## 2024-04-10 LAB — CUP PACEART REMOTE DEVICE CHECK
Battery Remaining Longevity: 102 mo
Battery Voltage: 3.09 V
Brady Statistic AP VP Percent: 0.1 %
Brady Statistic AP VS Percent: 0.02 %
Brady Statistic AS VP Percent: 93.18 %
Brady Statistic AS VS Percent: 6.71 %
Brady Statistic RA Percent Paced: 0.15 %
Brady Statistic RV Percent Paced: 93.27 %
Date Time Interrogation Session: 20250806065632
HighPow Impedance: 80 Ohm
Implantable Lead Connection Status: 753985
Implantable Lead Connection Status: 753985
Implantable Lead Connection Status: 753985
Implantable Lead Implant Date: 20250623
Implantable Lead Implant Date: 20250623
Implantable Lead Implant Date: 20250623
Implantable Lead Location: 753858
Implantable Lead Location: 753859
Implantable Lead Location: 753860
Implantable Lead Model: 4598
Implantable Lead Model: 5076
Implantable Pulse Generator Implant Date: 20250623
Lead Channel Impedance Value: 1102 Ohm
Lead Channel Impedance Value: 1292 Ohm
Lead Channel Impedance Value: 1349 Ohm
Lead Channel Impedance Value: 1501 Ohm
Lead Channel Impedance Value: 380 Ohm
Lead Channel Impedance Value: 437 Ohm
Lead Channel Impedance Value: 456 Ohm
Lead Channel Impedance Value: 494 Ohm
Lead Channel Impedance Value: 551 Ohm
Lead Channel Impedance Value: 703 Ohm
Lead Channel Impedance Value: 874 Ohm
Lead Channel Impedance Value: 893 Ohm
Lead Channel Impedance Value: 931 Ohm
Lead Channel Pacing Threshold Amplitude: 0.5 V
Lead Channel Pacing Threshold Amplitude: 0.625 V
Lead Channel Pacing Threshold Amplitude: 1.75 V
Lead Channel Pacing Threshold Pulse Width: 0.4 ms
Lead Channel Pacing Threshold Pulse Width: 0.4 ms
Lead Channel Pacing Threshold Pulse Width: 0.4 ms
Lead Channel Sensing Intrinsic Amplitude: 21.1 mV
Lead Channel Sensing Intrinsic Amplitude: 4.3 mV
Lead Channel Setting Pacing Amplitude: 2.75 V
Lead Channel Setting Pacing Amplitude: 3.5 V
Lead Channel Setting Pacing Amplitude: 3.5 V
Lead Channel Setting Pacing Pulse Width: 0.4 ms
Lead Channel Setting Pacing Pulse Width: 0.4 ms
Lead Channel Setting Sensing Sensitivity: 0.3 mV
Zone Setting Status: 755011
Zone Setting Status: 755011
Zone Setting Status: 755011

## 2024-04-14 ENCOUNTER — Ambulatory Visit: Payer: Self-pay | Admitting: Cardiology

## 2024-04-28 ENCOUNTER — Other Ambulatory Visit: Payer: Self-pay | Admitting: Internal Medicine

## 2024-04-28 DIAGNOSIS — F419 Anxiety disorder, unspecified: Secondary | ICD-10-CM

## 2024-04-29 NOTE — Telephone Encounter (Signed)
 Requested Prescriptions  Refused Prescriptions Disp Refills   busPIRone  (BUSPAR ) 5 MG tablet [Pharmacy Med Name: BUSPIRONE  HCL 5 MG TABLET] 180 tablet 1    Sig: TAKE 1 TABLET (5 MG TOTAL) BY MOUTH TWICE A DAY AS NEEDED FOR ANXIETY     Psychiatry: Anxiolytics/Hypnotics - Non-controlled Passed - 04/29/2024  5:03 PM      Passed - Valid encounter within last 12 months    Recent Outpatient Visits           3 weeks ago Anxiety   [facility] Bernardo Fend, DO   3 months ago Chronic obstructive pulmonary disease with acute exacerbation [facility])   [facility] Leavy Mole, PA-C   3 months ago Lower respiratory infection   [facility] Leavy Mole, [place]       Future Appointments             In 2 weeks Leavy Mole, PA-C [facility], [facility]   In 1 month Leavy Mole, PA-C [facility], [facility]             FLUoxetine  (PROZAC ) 10 MG capsule [Pharmacy Med Name: FLUOXETINE  HCL 10 MG CAPSULE] 90 capsule 1    Sig: TAKE 1 CAPSULE BY MOUTH EVERY DAY     Psychiatry:  Antidepressants - SSRI Passed - 04/29/2024  5:03 PM      Passed - Completed PHQ-2 or PHQ-9 in the last 360 days      Passed - Valid encounter within last 6 months    Recent Outpatient Visits           3 weeks ago Anxiety   [facility] Bernardo Fend, DO   3 months ago Chronic obstructive pulmonary disease with acute exacerbation [facility])   [facility] Leavy Mole, PA-C   3 months ago Lower respiratory infection   [facility] Leavy Mole, PA-C       Future Appointments             In 2 weeks Leavy Mole, PA-C [facility], [facility]   In 1 month Leavy Mole, PA-C [facility], [facility]

## 2024-04-30 ENCOUNTER — Telehealth: Payer: Self-pay | Admitting: *Deleted

## 2024-04-30 NOTE — Telephone Encounter (Signed)
 Ask pt if her BP machine is old or could be inaccurate. Is it a wrist or arm cuff.  Is this a typical BP for her? Please confirm she is not having syncope, near syncope, dizziness, unusual fatigue. Glendia Ferrier, PA-C    04/30/2024 1:06 PM

## 2024-04-30 NOTE — Telephone Encounter (Signed)
 Patient states she is taking Carvedilol  6.25 mg daily (0.5 tab in the morning and 0.5 tab in the evening).  Not taking metoprolol .  She states she was supposed to send BP readings to [place] and forgot to do this, [place] the beach. She states her SBP has been between 72-92, DBP around 56, HR 56-99.  I asked her about her SBP in the 70's and she states she feels fine, no dizziness/fatigue. She states she will record her BP/HR readings when she is back from the beach after this week and will send those to [place] via MyChart.

## 2024-04-30 NOTE — Telephone Encounter (Signed)
 Please see MyChart message Glendia Ferrier, PA-C    04/30/2024 1:10 PM

## 2024-04-30 NOTE — Telephone Encounter (Signed)
 Call placed to pt regarding message sent by Glendia Ferrier, PA-C.  Need to clarify if pt is taking Metoprolol  or Carvedilol .

## 2024-04-30 NOTE — Telephone Encounter (Signed)
 Patient is returning call.

## 2024-05-07 ENCOUNTER — Ambulatory Visit: Admitting: Cardiology

## 2024-05-14 ENCOUNTER — Ambulatory Visit: Admitting: Cardiology

## 2024-05-17 ENCOUNTER — Encounter: Payer: Self-pay | Admitting: Family Medicine

## 2024-05-17 ENCOUNTER — Ambulatory Visit (INDEPENDENT_AMBULATORY_CARE_PROVIDER_SITE_OTHER): Admitting: Family Medicine

## 2024-05-17 VITALS — BP 122/70 | HR 92 | Resp 16 | Ht 64.0 in | Wt 225.0 lb

## 2024-05-17 DIAGNOSIS — G43711 Chronic migraine without aura, intractable, with status migrainosus: Secondary | ICD-10-CM | POA: Diagnosis not present

## 2024-05-17 DIAGNOSIS — F419 Anxiety disorder, unspecified: Secondary | ICD-10-CM

## 2024-05-17 MED ORDER — BUSPIRONE HCL 5 MG PO TABS: 5.0000 mg | ORAL_TABLET | Freq: Two times a day (BID) | ORAL | 1 refills | Status: DC | PRN

## 2024-05-17 MED ORDER — FLUOXETINE HCL 10 MG PO CAPS: 10.0000 mg | ORAL_CAPSULE | Freq: Every day | ORAL | 1 refills | Status: DC

## 2024-05-17 NOTE — Progress Notes (Unsigned)
 Patient ID: Patricia Fischer, female    DOB: 04-03-1956, 68 y.o.   MRN: [id number]  PCP: Leavy Mole, PA-C  Chief Complaint  Patient presents with  . Follow-up    6 weeks  . Anxiety    Subjective:   Patricia Fischer is a 68 y.o. female, presents to clinic with CC of the following:  HPI  Feeling better with diet lifestyle effort and feels less anxious     05/17/2024    2:48 PM 04/05/2024    3:39 PM 04/04/2024    1:31 PM  Depression screen PHQ 2/9  Decreased Interest 0 1 0  Down, Depressed, Hopeless 0 0 0  PHQ - 2 Score 0 1 0  Altered sleeping 0 3 0  Tired, decreased energy 0 2 0  Change in appetite 0  0  Feeling bad or failure about yourself  0 1 0  Trouble concentrating 0 0 0  Moving slowly or fidgety/restless 0 0 0  Suicidal thoughts 0 0 0  PHQ-9 Score 0 7 0  Difficult doing work/chores  Not difficult at all Not difficult at all      05/17/2024    2:48 PM 04/05/2024    3:46 PM 01/03/2024   11:20 AM 11/01/2022   11:58 AM  GAD 7 : Generalized Anxiety Score  Nervous, Anxious, on Edge 0 2 0 0  Control/stop worrying 0 2 0 0  Worry too much - different things 0 2 0 0  Trouble relaxing 0 2 0 0  Restless 0 1 0 0  Easily annoyed or irritable 0 1 0 0  Afraid - awful might happen 0 2 0 0  Total GAD 7 Score 0 12 0 0  Anxiety Difficulty  Not difficult at all Not difficult at all    She is not taking prozac  or buspar  right now Discussed the use of AI scribe software for clinical note transcription with the patient, who gave verbal consent to proceed.  History of Present Illness Patricia Fischer is a 68 year old female who presents with recurrent migraines.  Recurrent migraines - Experiencing recurrent migraines after a 15-year remission - Previous episodes required hospital visits and medication, possibly Topamax  - Current migraines are less severe than prior episodes but are worsening - Associated symptoms include visual disturbances such as seeing lines and dots,  and blurred vision - Symptoms prompt her to lie down for relief - Attempting to locate previous migraine medications for symptom management  Return of migraines - less severe than prior but she asks about getting topimax  - Blurred vision associated with migraines - Dry eye syndrome causing irritation and blurry vision - Double vision present in one eye  Recent fall and mobility issues - Sustained a fall at home around 2:30 AM while getting up to use the bathroom and get a drink - Sustained a leg laceration requiring a bandage - Difficulty getting up after the fall attributed to knee problems and age - Awaiting knee surgery, which will require general anesthesia  Mood and anxiety symptoms - History of anxiety and depression - Previously managed with Prozac  and Buspar  - No longer taking Prozac ; Buspar  available for as-needed use, especially during stressful periods such as holidays - Increased anxiety and loneliness during the holiday season, particularly after her husband's passing  General well-being - Denies feeling unwell, states overall good well-being despite above issues      Patient Active Problem List   Diagnosis Date Noted  .  Systolic heart failure (HCC) 02/27/2024  . Chronic systolic heart failure (HCC) 02/26/2024  . Positive colorectal cancer screening using Cologuard test 03/16/2023  . Pain of right shoulder joint on movement 08/04/2022  . Osteoarthritis of knee 03/17/2022  . Claustrophobia 02/09/2022  . Pain in joint of left knee 12/21/2021  . Derangement of left knee 12/21/2021  . Chronic obstructive pulmonary disease (HCC) 01/22/2020  . Stress incontinence 12/27/2019  . Hyperlipidemia 12/27/2019  . Muscle spasms of lower extremity 12/27/2019  . Class 3 severe obesity with body mass index (BMI) of 40.0 to 44.9 in adult 07/12/2017  . Allergic rhinitis 06/26/2017  . Insomnia 06/26/2017  . Hematuria 07/27/2016  . Degenerative joint disease (DJD) of hip  03/31/2016  . Hx of breast cancer 03/22/2016  . History of pulmonary embolism 10/27/2015  . HTN (hypertension) 10/07/2015  . Depression, major, recurrent, moderate (HCC) 06/11/2015  . Anemia 05/15/2015  . Do not resuscitate status 05/15/2015  . HFrEF (heart failure with reduced ejection fraction) (HCC) 05/05/2015  . Left leg DVT (HCC) 05/05/2015  . Pulmonary hypertension (HCC)   . Saddle pulmonary embolus (HCC)   . LBBB (left bundle branch block)-rate related  04/19/2015  . Chronic migraine without aura, with intractable migraine, so stated, with status migrainosus 11/04/2014      Current Outpatient Medications:  .  albuterol  (VENTOLIN  HFA) 108 (90 Base) MCG/ACT inhaler, TAKE 2 PUFFS BY MOUTH EVERY 6 HOURS AS NEEDED FOR WHEEZE OR SHORTNESS OF BREATH, Disp: 8.5 each, Rfl: 2 .  carvedilol  (COREG ) 6.25 MG tablet, Take 0.5 tablets (3.125 mg total) by mouth 2 (two) times daily., Disp: 90 tablet, Rfl: 3 .  cetirizine (ZYRTEC) 10 MG tablet, Take 10 mg by mouth daily., Disp: , Rfl:  .  fluticasone  (FLONASE ) 50 MCG/ACT nasal spray, SPRAY 2 SPRAYS INTO EACH NOSTRIL EVERY DAY, Disp: 48 mL, Rfl: 1 .  Fluticasone -Umeclidin-Vilant (TRELEGY ELLIPTA ) 100-62.5-25 MCG/ACT AEPB, Inhale 1 puff into the lungs daily., Disp: 60 each, Rfl: 5 .  furosemide  (LASIX ) 40 MG tablet, Take 40 mg by mouth daily as needed for fluid or edema., Disp: , Rfl:  .  gabapentin  (NEURONTIN ) 300 MG capsule, Take 300 mg by mouth 3 (three) times daily as needed (nerve pain)., Disp: , Rfl:  .  methocarbamol  (ROBAXIN ) 500 MG tablet, Take 500 mg by mouth every 8 (eight) hours as needed for muscle spasms., Disp: , Rfl:  .  rivaroxaban  (XARELTO ) 10 MG TABS tablet, Take 1 tablet (10 mg total) by mouth daily., Disp: 90 tablet, Rfl: 3 .  rosuvastatin  (CRESTOR ) 20 MG tablet, Take 1 tablet (20 mg total) by mouth daily., Disp: 90 tablet, Rfl: 3 .  sacubitril -valsartan  (ENTRESTO ) 24-26 MG, Take 1 tablet by mouth 2 (two) times daily., Disp: 180  tablet, Rfl: 3 .  spironolactone  (ALDACTONE ) 25 MG tablet, TAKE 1 TABLET (25 MG TOTAL) BY MOUTH DAILY., Disp: 90 tablet, Rfl: 3 .  tizanidine  (ZANAFLEX ) 2 MG capsule, Take 2 mg by mouth 3 (three) times daily as needed for muscle spasms., Disp: , Rfl:  .  traMADol  (ULTRAM ) 50 MG tablet, Take 50 mg by mouth every 4 (four) hours as needed for severe pain (pain score 7-10)., Disp: , Rfl:  .  vitamin B-12 (CYANOCOBALAMIN ) 500 MCG tablet, Take 2,500 mcg by mouth daily., Disp: , Rfl:  .  vitamin C  (ASCORBIC ACID ) 500 MG tablet, Take 500 mg by mouth daily., Disp: , Rfl:  .  busPIRone  (BUSPAR ) 5 MG tablet, Take 1 tablet (5 mg  total) by mouth 2 (two) times daily as needed (anxiety). (Patient not taking: Reported on 05/17/2024), Disp: 60 tablet, Rfl: 1 .  FLUoxetine  (PROZAC ) 10 MG capsule, Take 1 capsule (10 mg total) by mouth daily. (Patient not taking: Reported on 05/17/2024), Disp: 30 capsule, Rfl: 1  Current Facility-Administered Medications:  .  albuterol  (PROVENTIL ) (2.5 MG/3ML) 0.083% nebulizer solution 2.5 mg, 2.5 mg, Nebulization, Once,    Allergies  Allergen Reactions  . Gadolinium Derivatives Shortness Of Breath and Nausea And Vomiting  . Levaquin  [Levofloxacin  In D5w] Itching    Was giving with vancomycin  at same time - unsure which one pt had a reaction to  . Sulfa Antibiotics Swelling    Ears swelled up like [place]  . Vancomycin  Itching    Was giving at same time as Levaquin  - unsure which pt had reaction to  . Abilify  [Aripiprazole ] Other (See Comments)    Dry mouth with sores   . Levofloxacin  Itching  . Codeine Itching and Rash  . Oxycodone  Itching  . Penicillins Hives    Has patient had a PCN reaction causing immediate rash, facial/tongue/throat swelling, SOB or lightheadedness with hypotension: Yes Has patient had a PCN reaction causing severe rash involving mucus membranes or skin necrosis: No Has patient had a PCN reaction that required hospitalization No Has patient had a PCN  reaction occurring within the last 10 years: No If all of the above answers are NO, then may proceed with Cephalosporin use.     Social History   Tobacco Use  . Smoking status: Never  . Smokeless tobacco: Never  Vaping Use  . Vaping status: Never Used  Substance Use Topics  . Alcohol use: Not Currently    Comment: occasional wine  . Drug use: Never      Chart Review Today: I personally reviewed active problem list, medication list, allergies, family history, social history, health maintenance, notes from last encounter, lab results, imaging with the patient/caregiver today.   Review of Systems  Constitutional: Negative.   HENT: Negative.    Eyes: Negative.   Respiratory: Negative.    Cardiovascular: Negative.   Gastrointestinal: Negative.   Endocrine: Negative.   Genitourinary: Negative.   Musculoskeletal: Negative.   Skin: Negative.   Allergic/Immunologic: Negative.   Neurological: Negative.   Hematological: Negative.   Psychiatric/Behavioral: Negative.    All other systems reviewed and are negative.      Objective:   Vitals:   05/17/24 1438  BP: 122/70  Pulse: 92  Resp: 16  SpO2: 98%  Weight: 225 lb (102.1 kg)  Height: 5' 4 (1.626 m)    Body mass index is 38.62 kg/m.  Physical Exam Vitals and nursing note reviewed.  Constitutional:      General: She is not in acute distress.    Appearance: Normal appearance. She is well-developed. She is obese. She is not ill-appearing, toxic-appearing or diaphoretic.  HENT:     Head: Normocephalic and atraumatic.     Right Ear: External ear normal.     Left Ear: External ear normal.     Nose: Nose normal.  Eyes:     General: No scleral icterus.       Right eye: No discharge.        Left eye: No discharge.     Conjunctiva/sclera: Conjunctivae normal.  Neck:     Trachea: No tracheal deviation.  Cardiovascular:     Rate and Rhythm: Normal rate.  Pulmonary:     Effort: Pulmonary effort is  normal. No  respiratory distress.     Breath sounds: No stridor.  Skin:    General: Skin is warm and dry.     Findings: Bruising present. No rash.  Neurological:     Mental Status: She is alert.     Motor: No abnormal muscle tone.     Coordination: Coordination normal.     Gait: Gait normal.  Psychiatric:        Mood and Affect: Mood normal.        Behavior: Behavior normal.      Results for orders placed or performed in visit on 04/10/24  CUP PACEART REMOTE DEVICE CHECK   Collection Time: 04/10/24  6:56 AM  Result Value Ref Range   Date Time Interrogation Session [id number]    Pulse Generator Manufacturer MERM    Pulse Gen Model [id number] Cobalt XT HF Quad CRT-D MRI    Pulse Gen Serial Number [id number]    Clinic Name [facility]    Implantable Pulse Generator Type Cardiac Resynch Therapy Defibulator    Implantable Pulse Generator Implant Date 79749376    Implantable Lead Manufacturer MERM    Implantable Lead Model 4598 Attain Performa S MRI SureScan    Implantable Lead Serial Number [id number]    Implantable Lead Implant Date 79749376    Implantable Lead Location Detail 1 UNKNOWN    Implantable Lead Location [id number]    Implantable Lead Connection Status [id number]    Implantable Lead Manufacturer MERM    Implantable Lead Model 5076 CapSureFix Novus MRI SureScan    Implantable Lead Serial Number [id number]    Implantable Lead Implant Date 79749376    Implantable Lead Location Detail 1 APPENDAGE    Implantable Lead Location [id number]    Implantable Lead Connection Status [id number]    Implantable Lead Manufacturer [facility]    Implantable Lead Model 850-884-9899 Sprint Quattro Secure S MRI SureScan    Implantable Lead Serial Number [id number]    Implantable Lead Implant Date 79749376    Implantable Lead Location Detail 1 SEPTUM    Implantable Lead Location [id number]    Implantable Lead Connection Status [id number]    Lead Channel Setting Sensing Sensitivity 0.3 mV   Lead Channel Setting Pacing Amplitude  3.5 V   Lead Channel Setting Pacing Pulse Width 0.4 ms   Lead Channel Setting Pacing Amplitude 3.5 V   Lead Channel Setting Pacing Pulse Width 0.4 ms   Lead Channel Setting Pacing Amplitude 2.75 V   Lead Channel Setting Pacing Capture Mode Adaptive Capture    Zone Setting Status Active    Zone Setting Status Inactive    Zone Setting Status Inactive    Zone Setting Status 755011    Zone Setting Status 755011    Zone Setting Status [phone]    Lead Channel Impedance Value 437 ohm   Lead Channel Sensing Intrinsic Amplitude 4.3 mV   Lead Channel Pacing Threshold Amplitude 0.5 V   Lead Channel Pacing Threshold Pulse Width 0.4 ms   Lead Channel Impedance Value 380 ohm   Lead Channel Impedance Value 494 ohm   Lead Channel Sensing Intrinsic Amplitude 21.1 mV   Lead Channel Pacing Threshold Amplitude 0.625 V   Lead Channel Pacing Threshold Pulse Width 0.4 ms   HighPow Impedance 80 ohm   Lead Channel Impedance Value 551 ohm   Lead Channel Impedance Value 456 ohm   Lead Channel Impedance Value 874 ohm   Lead Channel Impedance Value 1,102 ohm   Lead Channel Impedance Value 931  ohm   Lead Channel Impedance Value 1,349 ohm   Lead Channel Impedance Value 1,292 ohm   Lead Channel Impedance Value 893 ohm   Lead Channel Impedance Value 703 ohm   Lead Channel Impedance Value 1,501 ohm   Lead Channel Pacing Threshold Amplitude 1.75 V   Lead Channel Pacing Threshold Pulse Width 0.4 ms   Battery Remaining Longevity 102 mo   Battery Voltage 3.09 V   Brady Statistic RA Percent Paced 0.15 %   Brady Statistic RV Percent Paced 93.27 %   Brady Statistic AP VP Percent 0.10 %   Brady Statistic AS VP Percent 93.18 %   Brady Statistic AP VS Percent 0.02 %   Brady Statistic AS VS Percent 6.71 %       Assessment & Plan:   1. Anxiety (Primary)  - busPIRone  (BUSPAR ) 5 MG tablet; Take 1-3 tablets (5-15 mg total) by mouth 2 (two) times daily as needed (anxiety).  Dispense: 60 tablet; Refill: 1 - FLUoxetine   (PROZAC ) 10 MG capsule; Take 1 capsule (10 mg total) by mouth daily.  Dispense: 90 capsule; Refill: 1      Michelene Cower, PA-C 05/17/24 2:53 PM

## 2024-05-19 ENCOUNTER — Encounter: Payer: Self-pay | Admitting: Family Medicine

## 2024-05-19 MED ORDER — TOPIRAMATE 25 MG PO TABS: 25.0000 mg | ORAL_TABLET | Freq: Every day | ORAL | 0 refills | Status: DC

## 2024-05-26 ENCOUNTER — Other Ambulatory Visit: Payer: Self-pay | Admitting: Cardiology

## 2024-05-27 NOTE — Progress Notes (Unsigned)
 Electrophysiology Clinic Note    Date:  05/28/2024  Patient ID:  Patricia Fischer, DOB October 05, 1955, MRN [id number] PCP:  Leavy Mole, PA-C  Cardiologist:  Oneil Parchment, MD   Cardiology APP:  Lelon Glendia DASEN, PA-C  Electrophysiologist:  Fonda Kitty, MD  Electrophysiology APP:  Chyanne Kohut, NP     Discussed the use of AI scribe software for clinical note transcription with the patient, who gave verbal consent to proceed.   Patient Profile    Chief Complaint: 90d CRT-D follow-up  History of Present Illness: Patricia Fischer is a 68 y.o. female with PMH notable for HFrEF, LBBB s/p CRT-D, PE; seen today for Fonda Kitty, MD for routine electrophysiology follow-up s/p BiV Defibrillator implant.  She is s/p CRT-D implant 02/2024 by Dr. Kitty.   On follow-up today, she has noticed less SOB with activity. She does continue to have fatigue, needs a nap almost everyday. She takes PRN lasix  once a week. She denies chest pain, chest pressure, palpitations. She has been making dietary changes to attempt to lose weight. She has dental work planned for soon, and asks whether she needs to take abx beforehand.  She is also planning knee surgery soon.  Her device implant site remains tender to palpitation, but otherwise does not bother her.     Arrhythmia/Device History MDT CRT-D, imp 02/2024, dx HFrEF, LBBB    ROS:  Please see the history of present illness. All other systems are reviewed and otherwise negative.    Physical Exam    VS:  BP 92/78 (BP Location: Left Arm, Patient Position: Sitting, Cuff Size: Normal)   Pulse 73   Ht 5' 3 (1.6 m)   Wt 225 lb 6.4 oz (102.2 kg)   SpO2 96%   BMI 39.93 kg/m  BMI: Body mass index is 39.93 kg/m.      Wt Readings from Last 3 Encounters:  05/28/24 225 lb 6.4 oz (102.2 kg)  05/17/24 225 lb (102.1 kg)  04/05/24 239 lb 14.4 oz (108.8 kg)     GEN- The patient is well appearing, alert and oriented x 3 today.   Lungs- diminished  throughout, Clear to ausculation bilaterally, normal work of breathing.  Heart- Regular rate and rhythm, no murmurs, rubs or gallops Extremities- 1+ peripheral edema, warm, dry Skin-  device pocket well-healed, no tethering   Device interrogation done today and reviewed by myself:  Battery 8.5 years Lead thresholds, impedence, sensing stable  Adjusted lead thresholds to chronic levels OptiVol elevated VP 95% Brief AF episodes, less than 1 minute in duration No ventricular arrhythmias   Studies Reviewed   Previous EP, cardiology notes.    EKG is ordered. Personal review of EKG from today shows:    EKG Interpretation Date/Time:  Tuesday May 28 2024 13:05:02 EDT Ventricular Rate:  73 PR Interval:  108 QRS Duration:  146 QT Interval:  450 QTC Calculation: 495 R Axis:   186  Text Interpretation: Atrial-sensed ventricular-paced rhythm Biventricular pacemaker detected Confirmed by Yarixa Lightcap [phone]) on 05/28/2024 1:10:58 PM    TTE, 11/23/2023  1. Septal-lateral dyssynchrony. Left ventricular ejection fraction, by estimation, is 25 to 30%. The left ventricle has severely decreased function. The left ventricle demonstrates regional wall motion abnormalities (see scoring diagram/findings for  description). Left ventricular diastolic parameters are consistent with Grade I diastolic dysfunction (impaired relaxation).   2. Right ventricular systolic function is normal. The right ventricular size is normal. There is normal pulmonary artery systolic pressure.  3. The mitral valve is normal in structure. Mild mitral valve regurgitation. No evidence of mitral stenosis.   4. The aortic valve is tricuspid. Aortic valve regurgitation is not visualized. No aortic stenosis is present.   5. The inferior vena cava is normal in size with greater than 50% respiratory variability, suggesting right atrial pressure of 3 mmHg.    Assessment and Plan     #) NICM #) LBBB s/p CRT-D Device  functioning well, see paceart for details VP 95% Adjusted thresholds to chronic leads Update TTE  #) HFrEF OptiVol elevated with slight increased lower extremity edema Recommend she increase lasix  to twice per week Continue 3.125mg  coreg  BID, 25mg  spiro, 24-26 entresto  Has follow-up with gen cards in ~2 months  #) subclinical AF Single episode less than 1 minute in duration Continue to monitor via device       Current medicines are reviewed at length with the patient today.   The patient does not have concerns regarding her medicines.  The following changes were made today:   INCREASE PRN lasix  to twice per week  Labs/ tests ordered today include:  Orders Placed This Encounter  Procedures   EKG 12-Lead   ECHOCARDIOGRAM COMPLETE     Disposition: Follow up with Dr. Kennyth or EP APP in 12 months, sooner if needed based on TTE results   Signed, Chantal Needle, NP  05/28/24  1:46 PM  Electrophysiology [facility]

## 2024-05-28 ENCOUNTER — Ambulatory Visit: Attending: Cardiology | Admitting: Cardiology

## 2024-05-28 VITALS — BP 92/78 | HR 73 | Ht 63.0 in | Wt 225.4 lb

## 2024-05-28 DIAGNOSIS — I428 Other cardiomyopathies: Secondary | ICD-10-CM

## 2024-05-28 DIAGNOSIS — I5020 Unspecified systolic (congestive) heart failure: Secondary | ICD-10-CM

## 2024-05-28 DIAGNOSIS — Z95810 Presence of automatic (implantable) cardiac defibrillator: Secondary | ICD-10-CM | POA: Diagnosis not present

## 2024-05-28 LAB — CUP PACEART INCLINIC DEVICE CHECK
Date Time Interrogation Session: 20250923135646
Implantable Lead Connection Status: 753985
Implantable Lead Connection Status: 753985
Implantable Lead Connection Status: 753985
Implantable Lead Implant Date: 20250623
Implantable Lead Implant Date: 20250623
Implantable Lead Implant Date: 20250623
Implantable Lead Location: 753858
Implantable Lead Location: 753859
Implantable Lead Location: 753860
Implantable Lead Model: 4598
Implantable Lead Model: 5076
Implantable Pulse Generator Implant Date: 20250623

## 2024-05-28 MED ORDER — FUROSEMIDE 40 MG PO TABS: 40.0000 mg | ORAL_TABLET | ORAL | 3 refills | Status: DC

## 2024-05-28 NOTE — Patient Instructions (Signed)
 Medication Instructions:  Your physician recommends the following medication changes.  INCREASE: Lasix  to 40 mg by mouth twice a week   *If you need a refill on your cardiac medications before your next appointment, please call your pharmacy*  Lab Work: No labs ordered today    Testing/Procedures: Your physician has requested that you have an echocardiogram. Echocardiography is a painless test that uses sound waves to create images of your heart. It provides your doctor with information about the size and shape of your heart and how well your heart's chambers and valves are working.   You may receive an ultrasound enhancing agent through an IV if needed to better visualize your heart during the echo. This procedure takes approximately one hour.  There are no restrictions for this procedure.  This will take place at [address] [facility] [address], [place] [address]  Please note: We ask at that you not bring children with you during ultrasound (echo/ vascular) testing. Due to room size and safety concerns, children are not allowed in the ultrasound rooms during exams. Our front office staff cannot provide observation of children in our lobby area while testing is being conducted. An adult accompanying a patient to their appointment will only be allowed in the ultrasound room at the discretion of the ultrasound technician under special circumstances. We apologize for any inconvenience.   Follow-Up: At [facility], you and your health needs are our priority.  As part of our continuing mission to provide you with exceptional heart care, our providers are all part of one team.  This team includes your primary Cardiologist (physician) and Advanced Practice Providers or APPs (Physician Assistants and Nurse Practitioners) who all work together to provide you with the care you need, when you need it.  Your next appointment:   1 year(s)  Provider:   Suzann Riddle, NP or  Dr. Kennyth

## 2024-06-01 ENCOUNTER — Ambulatory Visit: Payer: Self-pay | Admitting: Cardiology

## 2024-06-03 ENCOUNTER — Other Ambulatory Visit: Payer: Self-pay | Admitting: Family Medicine

## 2024-06-03 DIAGNOSIS — Z1231 Encounter for screening mammogram for malignant neoplasm of breast: Secondary | ICD-10-CM

## 2024-06-03 NOTE — Progress Notes (Signed)
 Remote ICD Transmission

## 2024-06-05 ENCOUNTER — Ambulatory Visit (INDEPENDENT_AMBULATORY_CARE_PROVIDER_SITE_OTHER): Admitting: Family Medicine

## 2024-06-05 ENCOUNTER — Encounter: Payer: Self-pay | Admitting: Family Medicine

## 2024-06-05 VITALS — BP 106/62 | HR 95 | Resp 16 | Ht 63.0 in | Wt 221.0 lb

## 2024-06-05 DIAGNOSIS — Z0000 Encounter for general adult medical examination without abnormal findings: Secondary | ICD-10-CM

## 2024-06-05 DIAGNOSIS — F419 Anxiety disorder, unspecified: Secondary | ICD-10-CM

## 2024-06-05 DIAGNOSIS — J441 Chronic obstructive pulmonary disease with (acute) exacerbation: Secondary | ICD-10-CM

## 2024-06-05 DIAGNOSIS — Z6839 Body mass index (BMI) 39.0-39.9, adult: Secondary | ICD-10-CM

## 2024-06-05 DIAGNOSIS — E66812 Obesity, class 2: Secondary | ICD-10-CM

## 2024-06-05 DIAGNOSIS — G43711 Chronic migraine without aura, intractable, with status migrainosus: Secondary | ICD-10-CM

## 2024-06-05 DIAGNOSIS — Z1211 Encounter for screening for malignant neoplasm of colon: Secondary | ICD-10-CM

## 2024-06-05 DIAGNOSIS — Z23 Encounter for immunization: Secondary | ICD-10-CM | POA: Diagnosis not present

## 2024-06-05 DIAGNOSIS — Z95811 Presence of heart assist device: Secondary | ICD-10-CM | POA: Insufficient documentation

## 2024-06-05 MED ORDER — LORAZEPAM 1 MG PO TABS: ORAL_TABLET | ORAL | 0 refills | Status: AC

## 2024-06-05 MED ORDER — TRELEGY ELLIPTA 100-62.5-25 MCG/ACT IN AEPB: 1.0000 | INHALATION_SPRAY | Freq: Every day | RESPIRATORY_TRACT | 11 refills | Status: AC

## 2024-06-05 MED ORDER — BUSPIRONE HCL 5 MG PO TABS: 5.0000 mg | ORAL_TABLET | Freq: Two times a day (BID) | ORAL | 5 refills | Status: AC | PRN

## 2024-06-05 NOTE — Patient Instructions (Addendum)
 Health Maintenance  Topic Date Due   Colon Cancer Screening  01/16/2014   Breast Cancer Screening  07/04/2024   Zoster (Shingles) Vaccine (1 of 2) 08/16/2024*   Medicare Annual Wellness Visit  04/04/2025   DEXA scan (bone density measurement)  04/14/2025   DTaP/Tdap/Td vaccine (3 - Td or Tdap) 05/17/2026   Pneumococcal Vaccine for age over 31  Completed   Flu Shot  Completed   Hepatitis C Screening  Completed   HPV Vaccine  Aged Out   Meningitis B Vaccine  Aged Out   COVID-19 Vaccine  Discontinued  *Topic was postponed. The date shown is not the original due date.     Preventive Care 35 Years and Older, Female Preventive care refers to lifestyle choices and visits with your health care provider that can promote health and wellness. Preventive care visits are also called wellness exams. What can I expect for my preventive care visit? Counseling Your health care provider may ask you questions about your: Medical history, including: Past medical problems. Family medical history. Pregnancy and menstrual history. History of falls. Current health, including: Memory and ability to understand (cognition). Emotional well-being. Home life and relationship well-being. Sexual activity and sexual health. Lifestyle, including: Alcohol, nicotine or tobacco, and drug use. Access to firearms. Diet, exercise, and sleep habits. Work and work Astronomer. Sunscreen use. Safety issues such as seatbelt and bike helmet use. Physical exam Your health care provider will check your: Height and weight. These may be used to calculate your BMI (body mass index). BMI is a measurement that tells if you are at a healthy weight. Waist circumference. This measures the distance around your waistline. This measurement also tells if you are at a healthy weight and may help predict your risk of certain diseases, such as type 2 diabetes and high blood pressure. Heart rate and blood pressure. Body  temperature. Skin for abnormal spots. What immunizations do I need?  Vaccines are usually given at various ages, according to a schedule. Your health care provider will recommend vaccines for you based on your age, medical history, and lifestyle or other factors, such as travel or where you work. What tests do I need? Screening Your health care provider may recommend screening tests for certain conditions. This may include: Lipid and cholesterol levels. Hepatitis C test. Hepatitis B test. HIV (human immunodeficiency virus) test. STI (sexually transmitted infection) testing, if you are at risk. Lung cancer screening. Colorectal cancer screening. Diabetes screening. This is done by checking your blood sugar (glucose) after you have not eaten for a while (fasting). Mammogram. Talk with your health care provider about how often you should have regular mammograms. BRCA-related cancer screening. This may be done if you have a family history of breast, ovarian, tubal, or peritoneal cancers. Bone density scan. This is done to screen for osteoporosis. Talk with your health care provider about your test results, treatment options, and if necessary, the need for more tests. Follow these instructions at home: Eating and drinking  Eat a diet that includes fresh fruits and vegetables, whole grains, lean protein, and low-fat dairy products. Limit your intake of foods with high amounts of sugar, saturated fats, and salt. Take vitamin and mineral supplements as recommended by your health care provider. Do not drink alcohol if your health care provider tells you not to drink. If you drink alcohol: Limit how much you have to 0-1 drink a day. Know how much alcohol is in your drink. In the [place] one drink  equals one 12 oz bottle of beer (355 mL), one 5 oz glass of wine (148 mL), or one 1 oz glass of hard liquor (44 mL). Lifestyle Brush your teeth every morning and night with fluoride toothpaste. Floss one  time each day. Exercise for at least 30 minutes 5 or more days each week. Do not use any products that contain nicotine or tobacco. These products include cigarettes, chewing tobacco, and vaping devices, such as e-cigarettes. If you need help quitting, ask your health care provider. Do not use drugs. If you are sexually active, practice safe sex. Use a condom or other form of protection in order to prevent STIs. Take aspirin  only as told by your health care provider. Make sure that you understand how much to take and what form to take. Work with your health care provider to find out whether it is safe and beneficial for you to take aspirin  daily. Ask your health care provider if you need to take a cholesterol-lowering medicine (statin). Find healthy ways to manage stress, such as: Meditation, yoga, or listening to music. Journaling. Talking to a trusted person. Spending time with friends and family. Minimize exposure to UV radiation to reduce your risk of skin cancer. Safety Always wear your seat belt while driving or riding in a vehicle. Do not drive: If you have been drinking alcohol. Do not ride with someone who has been drinking. When you are tired or distracted. While texting. If you have been using any mind-altering substances or drugs. Wear a helmet and other protective equipment during sports activities. If you have firearms in your house, make sure you follow all gun safety procedures. What's next? Visit your health care provider once a year for an annual wellness visit. Ask your health care provider how often you should have your eyes and teeth checked. Stay up to date on all vaccines. This information is not intended to replace advice given to you by your health care provider. Make sure you discuss any questions you have with your health care provider. Document Revised: 02/17/2021 Document Reviewed: 02/17/2021 [employer] Patient Education  2024 [employer].

## 2024-06-05 NOTE — Progress Notes (Signed)
 Patient: Patricia Fischer, Female    DOB: 10/09/1955, 68 y.o.   MRN: [id number] Leavy Mole, PA-C Visit Date: 06/05/2024  Today's Provider: Mole Leavy, PA-C   Chief Complaint  Patient presents with   Annual Exam   Subjective:   Annual physical exam:  Patricia Fischer is a 68 y.o. female who presents today for complete physical exam:  Exercise/Activity:   not active - left knee OA - and chronic health conditions limits exercise - discussed options (resistance training/bands, could try exercise in pool etc) Diet/nutrition:   working on diet/losing weight Sleep:   no concerns with sleep - but does note she can't turn off her brain and sometimes can't sleep when she's worried about something  Anxiety a little better with buspar  - tried it for a dentist appt Asks for Valium  before dental procedure - she uses valium  for MRI, we have done ativan  in 2023 for dental procedures - reviewed chart and PDMP- explained controlled substance, sedating, needs ride to dentist and home, only take 60 min before procedure - should be adequate, do not take with any other sedating medications or pain meds, she may also try higher dose of buspar  for minor dental OV/procedures  Two dental procedures 10/22 and other one more invasive   Concerns with med costs She's trying to loose weight - CHF/heart issues and med changes  Discussed the use of AI scribe software for clinical note transcription with the patient, who gave verbal consent to proceed.  History of Present Illness Patricia Fischer is a 68 year old female who presents for CPE  Discussed several other acute and chronic issues  Cardiac changes CHF, increased diuretic, swelling and weight decreased  HA's she did not start topimax yet  Dental procedure-related anxiety - Significant anxiety triggered by dental visits, especially during procedures such as fillings - Able to manage anxiety during a recent dental cleaning by focusing on  calming thoughts and using buspar  - but it was just a OV, not procedure - Anticipates requiring medication for upcoming dental fillings - Previous use of Valium  for MRI with beneficial effect  Generalized anxiety symptoms and pharmacologic management - Currently prescribed Buspar , taken as needed, with a dosage of one to three tablets up to two times daily She tried it for going to the dentist - it did help with anxiety - Previous use of Prozac , which was effective in managing anxiety during stressful periods such as holidays and anniversaries, refilled it a few weeks ago, she has not restarted it, but anticipates possible need for holidays with death of spouse - hasn't decided yet - No current use of cough syrup or pain medications     SDOH Screenings   Food Insecurity: No Food Insecurity (05/10/2024)  Housing: Unknown (05/10/2024)  Transportation Needs: No Transportation Needs (05/10/2024)  Utilities: Not At Risk (04/04/2024)  Alcohol Screen: Low Risk  (04/04/2024)  Depression (PHQ2-9): Low Risk  (06/05/2024)  Recent Concern: Depression (PHQ2-9) - Medium Risk (04/05/2024)  Financial Resource Strain: Low Risk  (05/10/2024)  Physical Activity: Inactive (05/10/2024)  Social Connections: Moderately Integrated (05/10/2024)  Stress: No Stress Concern Present (05/10/2024)  Tobacco Use: Low Risk  (06/05/2024)  Health Literacy: Adequate Health Literacy (04/04/2024)      USPSTF grade A and B recommendations - reviewed and addressed today  Depression:  Phq 9 completed today by patient, was reviewed by me with patient in the room PHQ score is neg, pt feels mood is good  06/05/2024    3:40 PM 05/17/2024    2:48 PM 04/05/2024    3:39 PM 04/04/2024    1:31 PM  PHQ 2/9 Scores  PHQ - 2 Score 0 0 1 0  PHQ- 9 Score 0 0 7 0      06/05/2024    3:40 PM 05/17/2024    2:48 PM 04/05/2024    3:39 PM 04/04/2024    1:31 PM 01/03/2024   11:19 AM  Depression screen PHQ 2/9  Decreased Interest 0 0 1 0 0  Down,  Depressed, Hopeless 0 0 0 0 0  PHQ - 2 Score 0 0 1 0 0  Altered sleeping 0 0 3 0 0  Tired, decreased energy 0 0 2 0 0  Change in appetite 0 0  0 0  Feeling bad or failure about yourself  0 0 1 0 0  Trouble concentrating 0 0 0 0 0  Moving slowly or fidgety/restless 0 0 0 0 0  Suicidal thoughts 0 0 0 0 0  PHQ-9 Score 0 0 7 0 0  Difficult doing work/chores Not difficult at all  Not difficult at all Not difficult at all Not difficult at all    Alcohol screening: Flowsheet Row Clinical Support from 04/04/2024 in [facility]  AUDIT-C Score 0    Immunizations and Health Maintenance: Health Maintenance  Topic Date Due   Colonoscopy  01/16/2014   Mammogram  07/04/2024   Zoster Vaccines- Shingrix (1 of 2) 08/16/2024 (Originally 10/27/1974)   Medicare Annual Wellness (AWV)  04/04/2025   DEXA SCAN  04/14/2025   DTaP/Tdap/Td (3 - Td or Tdap) 05/17/2026   Pneumococcal Vaccine: 50+ Years  Completed   Influenza Vaccine  Completed   Hepatitis C Screening  Completed   HPV VACCINES  Aged Out   Meningococcal B Vaccine  Aged Out   COVID-19 Vaccine  Discontinued     Hep C Screening: done previously  STD testing and prevention (HIV/chl/gon/syphilis):  see above, no additional testing desired by pt today  Intimate partner violence:  safe currently, living alone  Sexual History/Pain during Intercourse: Widowed  Menstrual History/LMP/Abnormal Bleeding: no AUB No LMP recorded. Patient has had a hysterectomy.  Incontinence Symptoms:    Breast cancer: ordered due - and scheduled Last Mammogram: *see HM list above  Cervical cancer screening: aged out  Osteoporosis:   Discussion on osteoporosis per age, including high calcium  and vitamin D supplementation, weight bearing exercises Pt is supplementing with daily calcium /Vit D. Done she will due next year-  Bone scan/dexa  Skin cancer:  Hx of skin CA -  NO Discussed atypical lesions   Colorectal cancer:    Colonoscopy is due = she declines    Discussed concerning signs and sx of CRC, pt denies   Lung cancer:   Low Dose CT Chest recommended if Age 15-80 years, 20 pack-year currently smoking OR have quit w/in 15years. Patient does not qualify.    Social History   Tobacco Use   Smoking status: Never   Smokeless tobacco: Never  Vaping Use   Vaping status: Never Used  Substance Use Topics   Alcohol use: Not Currently    Comment: occasional wine   Drug use: Never     Flowsheet Row Clinical Support from 04/04/2024 in [facility]  AUDIT-C Score 0    Family History  Problem Relation Age of Onset   Breast cancer Paternal Aunt    Breast cancer Mother 8  Migraines Mother    Cancer Mother    Hypertension Mother    Diabetes Mother    Rectal cancer Father    Cancer Father    COPD Father    Alcohol abuse Father    Heart disease Paternal Grandmother    Stroke Paternal Grandmother    Heart attack Paternal Grandmother    Aneurysm Paternal Grandfather    COPD Paternal Grandfather    Breast cancer Cousin    Breast cancer Cousin    Breast cancer Cousin    Diabetes Sister    Hearing loss Son    Anesthesia problems Neg Hx      Blood pressure/Hypertension: BP Readings from Last 3 Encounters:  06/05/24 106/62  05/28/24 92/78  05/17/24 122/70    Weight/Obesity: Wt Readings from Last 3 Encounters:  06/05/24 221 lb (100.2 kg)  05/28/24 225 lb 6.4 oz (102.2 kg)  05/17/24 225 lb (102.1 kg)   BMI Readings from Last 3 Encounters:  06/05/24 39.15 kg/m  05/28/24 39.93 kg/m  05/17/24 38.62 kg/m     Lipids:  Lab Results  Component Value Date   CHOL 100 10/06/2022   CHOL 190 03/15/2022   CHOL 193 09/30/2019   Lab Results  Component Value Date   HDL 49 10/06/2022   HDL 48 (L) 03/15/2022   HDL 53 09/30/2019   Lab Results  Component Value Date   LDLCALC 37 10/06/2022   LDLCALC 120 (H) 03/15/2022   LDLCALC 113 (H) 09/30/2019   Lab Results   Component Value Date   TRIG 59 10/06/2022   TRIG 116 03/15/2022   TRIG 152 (H) 09/30/2019   Lab Results  Component Value Date   CHOLHDL 2.0 10/06/2022   CHOLHDL 4.0 03/15/2022   CHOLHDL 3.6 09/30/2019   No results found for: LDLDIRECT Based on the results of lipid panel his/her cardiovascular risk factor ( using [place] Cohort )  in the next 10 years is: The ASCVD Risk score (Arnett DK, et al., 2019) failed to calculate for the following reasons:   The valid total cholesterol range is 130 to 320 mg/dL  Glucose:  Glucose  Date Value Ref Range Status  02/15/2024 191 (H) 70 - 99 mg/dL Final  97/75/7974 91 70 - 99 mg/dL Final   Glucose, Bld  Date Value Ref Range Status  02/29/2024 100 (H) 70 - 99 mg/dL Final    Comment:    Glucose reference range applies only to samples taken after fasting for at least 8 hours.  02/28/2024 112 (H) 70 - 99 mg/dL Final    Comment:    Glucose reference range applies only to samples taken after fasting for at least 8 hours.  06/26/2023 95 65 - 99 mg/dL Final    Comment:    .            Fasting reference interval .     Advanced Care Planning:  A voluntary discussion about advance care planning including the explanation and discussion of advance directives.   Discussed health care proxy and Living will, and the patient was able to identify a health care proxy as sons - ACP in chart Patient does have a living will at present time.   Social History       Social History   Socioeconomic History   Marital status: Widowed    Spouse name: Not on file   Number of children: 2   Years of education: 50   Highest education level: 12th grade  Occupational History  Occupation: retired  Tobacco Use   Smoking status: Never   Smokeless tobacco: Never  Vaping Use   Vaping status: Never Used  Substance and Sexual Activity   Alcohol use: Not Currently    Comment: occasional wine   Drug use: Never   Sexual activity: Not Currently    Birth  control/protection: None  Other Topics Concern   Not on file  Social History Narrative   Not on file   Social Drivers of Health   Financial Resource Strain: Low Risk  (05/10/2024)   Overall Financial Resource Strain (CARDIA)    Difficulty of Paying Living Expenses: Not hard at all  Food Insecurity: No Food Insecurity (05/10/2024)   Hunger Vital Sign    Worried About Running Out of Food in the Last Year: Never true    Ran Out of Food in the Last Year: Never true  Transportation Needs: No Transportation Needs (05/10/2024)   PRAPARE - Administrator, Civil Service (Medical): No    Lack of Transportation (Non-Medical): No  Physical Activity: Inactive (05/10/2024)   Exercise Vital Sign    Days of Exercise per Week: 0 days    Minutes of Exercise per Session: Not on file  Stress: No Stress Concern Present (05/10/2024)   [employer] of Occupational Health - Occupational Stress Questionnaire    Feeling of Stress: Only a little  Social Connections: Moderately Integrated (05/10/2024)   Social Connection and Isolation Panel    Frequency of Communication with Friends and Family: More than three times a week    Frequency of Social Gatherings with Friends and Family: More than three times a week    Attends Religious Services: More than 4 times per year    Active Member of [employer] or Organizations: Yes    Attends Banker Meetings: More than 4 times per year    Marital Status: Widowed    Family History        Family History  Problem Relation Age of Onset   Breast cancer Paternal Aunt    Breast cancer Mother 10   Migraines Mother    Cancer Mother    Hypertension Mother    Diabetes Mother    Rectal cancer Father    Cancer Father    COPD Father    Alcohol abuse Father    Heart disease Paternal Grandmother    Stroke Paternal Grandmother    Heart attack Paternal Grandmother    Aneurysm Paternal Grandfather    COPD Paternal Grandfather    Breast cancer Cousin     Breast cancer Cousin    Breast cancer Cousin    Diabetes Sister    Hearing loss Son    Anesthesia problems Neg Hx     Patient Active Problem List   Diagnosis Date Noted   Systolic heart failure (HCC) 02/27/2024   Chronic systolic heart failure (HCC) 02/26/2024   Positive colorectal cancer screening using Cologuard test 03/16/2023   Pain of right shoulder joint on movement 08/04/2022   Osteoarthritis of knee 03/17/2022   Claustrophobia 02/09/2022   Pain in joint of left knee 12/21/2021   Derangement of left knee 12/21/2021   Chronic obstructive pulmonary disease (HCC) 01/22/2020   Stress incontinence 12/27/2019   Hyperlipidemia 12/27/2019   Muscle spasms of lower extremity 12/27/2019   Class 3 severe obesity with body mass index (BMI) of 40.0 to 44.9 in adult [facility]) 07/12/2017   Allergic rhinitis 06/26/2017   Insomnia 06/26/2017   Hematuria 07/27/2016  Degenerative joint disease (DJD) of hip 03/31/2016   Hx of breast cancer 03/22/2016   History of pulmonary embolism 10/27/2015   HTN (hypertension) 10/07/2015   Depression, major, recurrent, moderate (HCC) 06/11/2015   Anemia 05/15/2015   Do not resuscitate status 05/15/2015   HFrEF (heart failure with reduced ejection fraction) (HCC) 05/05/2015   Left leg DVT (HCC) 05/05/2015   Pulmonary hypertension (HCC)    Saddle pulmonary embolus (HCC)    LBBB (left bundle branch block)-rate related  04/19/2015   Chronic migraine without aura, with intractable migraine, so stated, with status migrainosus 11/04/2014    Past Surgical History:  Procedure Laterality Date   ABDOMINAL HYSTERECTOMY  1986   ANAL FISSURE REPAIR     ANTERIOR CERVICAL DECOMP/DISCECTOMY FUSION  01/23/2012   Procedure: ANTERIOR CERVICAL DECOMPRESSION/DISCECTOMY FUSION 1 LEVEL;  Surgeon: Arley SHAUNNA Helling, MD;  Location: [facility]URO ORS;  Service: Neurosurgery;  Laterality: N/A;  Cervical five-six anterior cervical discectomy fusion with plating   APPENDECTOMY     BIV ICD  INSERTION CRT-D N/A 02/26/2024   Procedure: BIV ICD INSERTION CRT-D;  Surgeon: Kennyth Chew, MD;  Location: [facility] INVASIVE CV LAB;  Service: Cardiovascular;  Laterality: N/A;   BREAST BIOPSY Left 06/2016   US  biopsy-PAPILLARY CYSTIC APOCRINE CHANGE    BREAST SURGERY Left 2011   Breast lumpectomy   CARDIAC CATHETERIZATION  2010   [facility]; pt states it was clear   CARDIAC CATHETERIZATION N/A 10/28/2015   Procedure: Right/Left Heart Cath and Coronary Angiography;  Surgeon: Victory LELON Sharps, MD;  Location: [facility] INVASIVE CV LAB;  Service: Cardiovascular;  Laterality: N/A;   CARPAL TUNNEL RELEASE Bilateral    CESAREAN SECTION  1982, 1979   CHOLECYSTECTOMY     COLONOSCOPY  01/16/2009   Dr Dessa   KNEE CARTILAGE SURGERY Left    LEAD INSERTION N/A 02/26/2024   Procedure: LEAD INSERTION;  Surgeon: Kennyth Chew, MD;  Location: [facility] INVASIVE CV LAB;  Service: Cardiovascular;  Laterality: N/A;   SHOULDER ARTHROSCOPY WITH SUBACROMIAL DECOMPRESSION, ROTATOR CUFF REPAIR AND BICEP TENDON REPAIR Right 11/22/2013   Procedure: RIGHT SHOULDER ATHROSCOPY OPEN SUBSCAP REPAIR DELTA-PECTORAL ;  Surgeon: Elspeth JONELLE Her, MD;  Location: [facility] OR;  Service: Orthopedics;  Laterality: Right;   TARSAL TUNNEL RELEASE Left 1990   TONSILLECTOMY     TUBAL LIGATION       Current Outpatient Medications:    albuterol  (VENTOLIN  HFA) 108 (90 Base) MCG/ACT inhaler, TAKE 2 PUFFS BY MOUTH EVERY 6 HOURS AS NEEDED FOR WHEEZE OR SHORTNESS OF BREATH, Disp: 8.5 each, Rfl: 2   carvedilol  (COREG ) 6.25 MG tablet, Take 0.5 tablets (3.125 mg total) by mouth 2 (two) times daily., Disp: 90 tablet, Rfl: 3   cetirizine (ZYRTEC) 10 MG tablet, Take 10 mg by mouth daily., Disp: , Rfl:    FLUoxetine  (PROZAC ) 10 MG capsule, Take 1 capsule (10 mg total) by mouth daily., Disp: 90 capsule, Rfl: 1   fluticasone  (FLONASE ) 50 MCG/ACT nasal spray, SPRAY 2 SPRAYS INTO EACH NOSTRIL EVERY DAY, Disp: 48 mL, Rfl: 1   furosemide  (LASIX ) 40 MG tablet, Take  1 tablet (40 mg total) by mouth 2 (two) times a week., Disp: 24 tablet, Rfl: 3   gabapentin  (NEURONTIN ) 300 MG capsule, Take 300 mg by mouth 3 (three) times daily as needed (nerve pain)., Disp: , Rfl:    LORazepam  (ATIVAN ) 1 MG tablet, Take 1 tablet (1 mg total) by mouth once as needed for up to 1 dose for anxiety (for procedural  anxiety - take one hour prior to appt), Disp: 4 tablet, Rfl: 0   methocarbamol  (ROBAXIN ) 500 MG tablet, Take 500 mg by mouth every 8 (eight) hours as needed for muscle spasms., Disp: , Rfl:    rivaroxaban  (XARELTO ) 10 MG TABS tablet, Take 1 tablet (10 mg total) by mouth daily., Disp: 90 tablet, Rfl: 3   rosuvastatin  (CRESTOR ) 20 MG tablet, TAKE 1 TABLET BY MOUTH EVERY DAY, Disp: 90 tablet, Rfl: 3   sacubitril -valsartan  (ENTRESTO ) 24-26 MG, Take 1 tablet by mouth 2 (two) times daily., Disp: 180 tablet, Rfl: 3   spironolactone  (ALDACTONE ) 25 MG tablet, TAKE 1 TABLET (25 MG TOTAL) BY MOUTH DAILY., Disp: 90 tablet, Rfl: 3   tizanidine  (ZANAFLEX ) 2 MG capsule, Take 2 mg by mouth 3 (three) times daily as needed for muscle spasms., Disp: , Rfl:    traMADol  (ULTRAM ) 50 MG tablet, Take 50 mg by mouth every 4 (four) hours as needed for severe pain (pain score 7-10)., Disp: , Rfl:    vitamin B-12 (CYANOCOBALAMIN ) 500 MCG tablet, Take 2,500 mcg by mouth daily., Disp: , Rfl:    vitamin C  (ASCORBIC ACID ) 500 MG tablet, Take 500 mg by mouth daily., Disp: , Rfl:    busPIRone  (BUSPAR ) 5 MG tablet, Take 1-3 tablets (5-15 mg total) by mouth 2 (two) times daily as needed (anxiety)., Disp: 60 tablet, Rfl: 5   Fluticasone -Umeclidin-Vilant (TRELEGY ELLIPTA ) 100-62.5-25 MCG/ACT AEPB, Inhale 1 puff into the lungs daily., Disp: 60 each, Rfl: 11   topiramate  (TOPAMAX ) 25 MG tablet, Take 1 tablet (25 mg total) by mouth daily., Disp: 90 tablet, Rfl: 0  Current Facility-Administered Medications:    albuterol  (PROVENTIL ) (2.5 MG/3ML) 0.083% nebulizer solution 2.5 mg, 2.5 mg, Nebulization, Once,    Allergies  Allergen Reactions   Gadolinium Derivatives Shortness Of Breath and Nausea And Vomiting   Levaquin  [Levofloxacin  In D5w] Itching    Was giving with vancomycin  at same time - unsure which one pt had a reaction to   Sulfa Antibiotics Swelling    Ears swelled up like [place]   Vancomycin  Itching    Was giving at same time as Levaquin  - unsure which pt had reaction to   Abilify  Gervasius.Gibbon ] Other (See Comments)    Dry mouth with sores    Levofloxacin  Itching   Codeine Itching and Rash   Oxycodone  Itching   Penicillins Hives    Has patient had a PCN reaction causing immediate rash, facial/tongue/throat swelling, SOB or lightheadedness with hypotension: Yes Has patient had a PCN reaction causing severe rash involving mucus membranes or skin necrosis: No Has patient had a PCN reaction that required hospitalization No Has patient had a PCN reaction occurring within the last 10 years: No If all of the above answers are NO, then may proceed with Cephalosporin use.    Patient Care Team: Leavy Mole, PA-C as PCP - General (Family Medicine) Jeffrie Oneil BROCKS, MD as PCP - Cardiology (Cardiology) Kennyth Chew, MD as PCP - Electrophysiology (Clinical Cardiac Electrophysiology) Bensimhon, Toribio SAUNDERS, MD as Consulting Physician (Cardiology) [place], [facility] Riddle, Suzann, NP as Nurse Practitioner (Clinical Cardiac Electrophysiology) Lelon Glendia ONEIDA DEVONNA as Physician Assistant (Cardiology)   Chart Review: I personally reviewed active problem list, medication list, allergies, family history, social history, health maintenance, notes from last encounter, lab results, imaging with the patient/caregiver today.   Review of Systems  Constitutional: Negative.   HENT: Negative.    Eyes: Negative.   Respiratory: Negative.  Cardiovascular: Negative.   Gastrointestinal: Negative.   Endocrine: Negative.   Genitourinary: Negative.   Musculoskeletal: Negative.   Skin:  Negative.   Allergic/Immunologic: Negative.   Neurological: Negative.   Hematological: Negative.   Psychiatric/Behavioral: Negative.    All other systems reviewed and are negative.         Objective:   Vitals:  Vitals:   06/05/24 1545  BP: 106/62  Pulse: 95  Resp: 16  SpO2: 97%  Weight: 221 lb (100.2 kg)  Height: 5' 3 (1.6 m)    Body mass index is 39.15 kg/m.  Physical Exam Vitals and nursing note reviewed.  Constitutional:      General: She is not in acute distress.    Appearance: Normal appearance. She is well-developed. She is obese. She is not ill-appearing, toxic-appearing or diaphoretic.  HENT:     Head: Normocephalic and atraumatic.     Right Ear: Tympanic membrane, ear canal and external ear normal. There is no impacted cerumen.     Left Ear: Tympanic membrane, ear canal and external ear normal. There is no impacted cerumen.     Nose: Nose normal.     Mouth/Throat:     Mouth: Mucous membranes are moist.     Pharynx: Oropharynx is clear.  Eyes:     General: No scleral icterus.       Right eye: No discharge.        Left eye: No discharge.     Conjunctiva/sclera: Conjunctivae normal.  Neck:     Trachea: No tracheal deviation.  Cardiovascular:     Rate and Rhythm: Normal rate.     Pulses: Normal pulses.     Heart sounds: Normal heart sounds.  Pulmonary:     Effort: Pulmonary effort is normal. No respiratory distress.     Breath sounds: Normal breath sounds. No stridor.  Musculoskeletal:     Right lower leg: No edema.     Left lower leg: No edema.     Comments: Left knee chronically swollen, limited ROM  Lymphadenopathy:     Cervical: No cervical adenopathy.  Skin:    General: Skin is warm and dry.     Findings: No rash.  Neurological:     Mental Status: She is alert. Mental status is at baseline.     Motor: No abnormal muscle tone.     Coordination: Coordination normal.     Gait: Gait abnormal (antalgic gait).  Psychiatric:        Mood and  Affect: Mood normal.        Behavior: Behavior normal.       Fall Risk:    06/05/2024    3:40 PM 04/04/2024    1:42 PM 01/03/2024   11:19 AM 06/26/2023    1:54 PM 03/16/2023   10:20 AM  Fall Risk   Falls in the past year? 0 0 0 0 0  Number falls in past yr: 0 0 0 0 0  Injury with Fall? 0 0 0 0 0  Risk for fall due to : No Fall Risks No Fall Risks  No Fall Risks No Fall Risks  Follow up Falls evaluation completed Falls evaluation completed;Falls prevention discussed Falls evaluation completed Falls prevention discussed;Education provided;Falls evaluation completed Falls prevention discussed;Education provided;Falls evaluation completed    Functional Status Survey: Is the patient deaf or have difficulty hearing?: No Does the patient have difficulty seeing, even when wearing glasses/contacts?: No Does the patient have difficulty concentrating, remembering, or making decisions?: No  Does the patient have difficulty walking or climbing stairs?: No Does the patient have difficulty dressing or bathing?: No Does the patient have difficulty doing errands alone such as visiting a doctor's office or shopping?: No   Assessment & Plan:    CPE completed today  USPSTF grade A and B recommendations reviewed with patient; age-appropriate recommendations, preventive care, screening tests, etc discussed and encouraged; healthy living encouraged; see AVS for patient education given to patient  Discussed importance of 150 minutes of physical activity weekly, AHA exercise recommendations given to pt in AVS/handout  Discussed importance of healthy diet:  eating lean meats and proteins, avoiding trans fats and saturated fats, avoid simple sugars and excessive carbs in diet, eat 6 servings of fruit/vegetables daily and drink plenty of water and avoid sweet beverages.    Recommended pt to do annual eye exam and routine dental exams/cleanings  Depression, alcohol, fall screening completed as documented  above and per flowsheets  Advance Care planning information and packet discussed and offered today, encouraged pt to discuss with family members/spouse/partner/friends and complete Advanced directive packet and bring copy to office   Reviewed Health Maintenance: Health Maintenance  Topic Date Due   Colonoscopy  01/16/2014   Mammogram  07/04/2024   Zoster Vaccines- Shingrix (1 of 2) 08/16/2024 (Originally 10[birth year]27/1973)   Medicare Annual Wellness (AWV)  04/04/2025   DEXA SCAN  04/14/2025   DTaP/Tdap/Td (3 - Td or Tdap) 05/17/2026   Pneumococcal Vaccine: 50+ Years  Completed   Influenza Vaccine  Completed   Hepatitis C Screening  Completed   HPV VACCINES  Aged Out   Meningococcal B Vaccine  Aged Out   COVID-19 Vaccine  Discontinued    Immunizations: Immunization History  Administered Date(s) Administered   Fluad Trivalent(High Dose 65+) 06/26/2023   INFLUENZA, HIGH DOSE SEASONAL PF 06/05/2024   Influenza Inj Mdck Quad Pf 06/21/2019   Influenza,inj,Quad PF,6+ Mos 07/01/2015, 05/17/2016, 06/26/2017, 08/08/2018   PNEUMOCOCCAL CONJUGATE-20 02/16/2021   Pneumococcal Polysaccharide-23 07/13/2007   Td 09/05/2004   Tdap 05/17/2016   Vaccines:   Shingrix: DUE -  40-64 yo and ask insurance if covered when patient above 1 yo Pneumonia: UTD educated and discussed with patient. Flu: done today  educated and discussed with patient.     ICD-10-CM   1. Adult general medical exam  Z00.00 CBC with Differential/Platelet    Comprehensive metabolic panel with GFR    Lipid panel    Hemoglobin A1c    TSH    2. Need for influenza vaccination  Z23 Flu vaccine HIGH DOSE PF(Fluzone Trivalent)    3. Anxiety due to invasive procedure  F41.9 LORazepam  (ATIVAN ) 1 MG tablet   see HPI for details - ativan  in the past, PDMP checked, ativan  prescribed for procedures -see discussion in HPI    4. Chronic obstructive pulmonary disease with acute exacerbation (HCC)  J44.1 Fluticasone -Umeclidin-Vilant  (TRELEGY ELLIPTA ) 100-62.5-25 MCG/ACT AEPB   refill on trelegy    5. Anxiety  F41.9 busPIRone  (BUSPAR ) 5 MG tablet   continue coping skills and buspar  prn    6. Screening for malignant neoplasm of colon  Z12.11    discussed screening options other than colonoscopy - she gets ill with prep - refuses    7. Presence of heart assist device [facility])  Z95.811    per cardiology    8. Chronic migraine without aura, with intractable migraine, so stated, with status migrainosus  G43.711    mild HA, she did not start topimax yet, decision  is up to pt, lower dose than prior meds, discussed its daily/preventative to manage, needs 3 m f/up    9. Class 2 severe obesity with serious comorbidity and body mass index (BMI) of 39.0 to 39.9 in adult, unspecified obesity type  E66.812 CBC with Differential/Platelet   Z68.39 Comprehensive metabolic panel with GFR    Lipid panel    Hemoglobin A1c    TSH   losing weight, working on diet, comorbid conditions HLD, COPD, CHF, HTN, OA      Return in about 6 months (around 12/04/2024) for Routine follow-up (3 month f/up if she starts headache meds/topimax virtual) .     Michelene Cower, PA-C 06/05/24 5:36 PM  [facility]

## 2024-06-06 ENCOUNTER — Ambulatory Visit: Payer: Self-pay | Admitting: Family Medicine

## 2024-06-06 LAB — CBC WITH DIFFERENTIAL/PLATELET
Absolute Lymphocytes: 1466 {cells}/uL (ref 850–3900)
Absolute Monocytes: 745 {cells}/uL (ref 200–950)
Basophils Absolute: 32 {cells}/uL (ref 0–200)
Basophils Relative: 0.4 %
Eosinophils Absolute: 97 {cells}/uL (ref 15–500)
Eosinophils Relative: 1.2 %
HCT: 43.4 % (ref 35.0–45.0)
Hemoglobin: 14 g/dL (ref 11.7–15.5)
MCH: 30.2 pg (ref 27.0–33.0)
MCHC: 32.3 g/dL (ref 32.0–36.0)
MCV: 93.5 fL (ref 80.0–100.0)
MPV: 12.1 fL (ref 7.5–12.5)
Monocytes Relative: 9.2 %
Neutro Abs: 5759 {cells}/uL (ref 1500–7800)
Neutrophils Relative %: 71.1 %
Platelets: 121 Thousand/uL — ABNORMAL LOW (ref 140–400)
RBC: 4.64 Million/uL (ref 3.80–5.10)
RDW: 12.9 % (ref 11.0–15.0)
Total Lymphocyte: 18.1 %
WBC: 8.1 Thousand/uL (ref 3.8–10.8)

## 2024-06-06 LAB — LIPID PANEL
Cholesterol: 112 mg/dL (ref <200)
HDL: 48 mg/dL — ABNORMAL LOW (ref >=50)
LDL Cholesterol (Calc): 43 mg/dL
Non-HDL Cholesterol (Calc): 64 mg/dL (ref <130)
Total CHOL/HDL Ratio: 2.3 (calc) (ref <5.0)
Triglycerides: 130 mg/dL (ref <150)

## 2024-06-06 LAB — COMPREHENSIVE METABOLIC PANEL WITH GFR
AG Ratio: 2 (calc) (ref 1.0–2.5)
ALT: 13 U/L (ref 6–29)
AST: 20 U/L (ref 10–35)
Albumin: 4.1 g/dL (ref 3.6–5.1)
Alkaline phosphatase (APISO): 64 U/L (ref 37–153)
BUN/Creatinine Ratio: 16 (calc) (ref 6–22)
BUN: 21 mg/dL (ref 7–25)
CO2: 30 mmol/L (ref 20–32)
Calcium: 9.8 mg/dL (ref 8.6–10.4)
Chloride: 101 mmol/L (ref 98–110)
Creat: 1.34 mg/dL — ABNORMAL HIGH (ref 0.50–1.05)
Globulin: 2.1 g/dL (ref 1.9–3.7)
Glucose, Bld: 102 mg/dL — ABNORMAL HIGH (ref 65–99)
Potassium: 4.4 mmol/L (ref 3.5–5.3)
Sodium: 140 mmol/L (ref 135–146)
Total Bilirubin: 0.6 mg/dL (ref 0.2–1.2)
Total Protein: 6.2 g/dL (ref 6.1–8.1)
eGFR: 43 mL/min/1.73m2 — ABNORMAL LOW (ref >=60)

## 2024-06-06 LAB — HEMOGLOBIN A1C
Hgb A1c MFr Bld: 5.9 % — ABNORMAL HIGH (ref <5.7)
Mean Plasma Glucose: 123 mg/dL
eAG (mmol/L): 6.8 mmol/L

## 2024-06-06 LAB — TSH: TSH: 0.61 m[IU]/L (ref 0.40–4.50)

## 2024-07-03 ENCOUNTER — Ambulatory Visit: Attending: Cardiology

## 2024-07-03 DIAGNOSIS — I5020 Unspecified systolic (congestive) heart failure: Secondary | ICD-10-CM

## 2024-07-03 LAB — ECHOCARDIOGRAM COMPLETE
AR max vel: 2.31 cm2
AV Area VTI: 1.93 cm2
AV Area mean vel: 2.17 cm2
AV Mean grad: 5 mmHg
AV Peak grad: 9.1 mmHg
Ao pk vel: 1.51 m/s
Area-P 1/2: 3.82 cm2
S' Lateral: 3.78 cm

## 2024-07-05 ENCOUNTER — Ambulatory Visit
Admission: RE | Admit: 2024-07-05 | Discharge: 2024-07-05 | Disposition: A | Discharge status: Home / Self Care | Source: Ambulatory Visit | Attending: Family Medicine | Admitting: Family Medicine

## 2024-07-05 DIAGNOSIS — Z1231 Encounter for screening mammogram for malignant neoplasm of breast: Secondary | ICD-10-CM | POA: Diagnosis present

## 2024-07-10 ENCOUNTER — Ambulatory Visit

## 2024-07-10 DIAGNOSIS — I447 Left bundle-branch block, unspecified: Secondary | ICD-10-CM | POA: Diagnosis not present

## 2024-07-11 ENCOUNTER — Other Ambulatory Visit: Payer: Self-pay | Admitting: Cardiology

## 2024-07-11 ENCOUNTER — Ambulatory Visit: Payer: Self-pay | Admitting: Cardiology

## 2024-07-11 LAB — CUP PACEART REMOTE DEVICE CHECK
Battery Remaining Longevity: 91 mo
Battery Voltage: 3.01 V
Brady Statistic AP VP Percent: 0.39 %
Brady Statistic AP VS Percent: 0.01 %
Brady Statistic AS VP Percent: 95.5 %
Brady Statistic AS VS Percent: 4.09 %
Brady Statistic RA Percent Paced: 0.58 %
Brady Statistic RV Percent Paced: 95.89 %
Date Time Interrogation Session: 20251104235657
HighPow Impedance: 82 Ohm
Implantable Lead Connection Status: 753985
Implantable Lead Connection Status: 753985
Implantable Lead Connection Status: 753985
Implantable Lead Implant Date: 20250623
Implantable Lead Implant Date: 20250623
Implantable Lead Implant Date: 20250623
Implantable Lead Location: 753858
Implantable Lead Location: 753859
Implantable Lead Location: 753860
Implantable Lead Model: 4598
Implantable Lead Model: 5076
Implantable Pulse Generator Implant Date: 20250623
Lead Channel Impedance Value: 323 Ohm
Lead Channel Impedance Value: 418 Ohm
Lead Channel Impedance Value: 418 Ohm
Lead Channel Impedance Value: 418 Ohm
Lead Channel Impedance Value: 418 Ohm
Lead Channel Impedance Value: 494 Ohm
Lead Channel Impedance Value: 513 Ohm
Lead Channel Impedance Value: 570 Ohm
Lead Channel Impedance Value: 760 Ohm
Lead Channel Impedance Value: 817 Ohm
Lead Channel Impedance Value: 855 Ohm
Lead Channel Impedance Value: 893 Ohm
Lead Channel Impedance Value: 912 Ohm
Lead Channel Pacing Threshold Amplitude: 0.625 V
Lead Channel Pacing Threshold Amplitude: 0.75 V
Lead Channel Pacing Threshold Amplitude: 3.25 V
Lead Channel Pacing Threshold Pulse Width: 0.4 ms
Lead Channel Pacing Threshold Pulse Width: 0.4 ms
Lead Channel Pacing Threshold Pulse Width: 0.4 ms
Lead Channel Sensing Intrinsic Amplitude: 14.8 mV
Lead Channel Sensing Intrinsic Amplitude: 3.3 mV
Lead Channel Setting Pacing Amplitude: 1.5 V
Lead Channel Setting Pacing Amplitude: 2 V
Lead Channel Setting Pacing Amplitude: 3.75 V
Lead Channel Setting Pacing Pulse Width: 0.4 ms
Lead Channel Setting Pacing Pulse Width: 0.4 ms
Lead Channel Setting Sensing Sensitivity: 0.3 mV
Zone Setting Status: 755011
Zone Setting Status: 755011
Zone Setting Status: 755011

## 2024-07-12 NOTE — Progress Notes (Signed)
 Remote ICD Transmission

## 2024-07-19 ENCOUNTER — Other Ambulatory Visit: Payer: Self-pay

## 2024-07-19 DIAGNOSIS — I82592 Chronic embolism and thrombosis of other specified deep vein of left lower extremity: Secondary | ICD-10-CM

## 2024-07-19 MED ORDER — RIVAROXABAN 10 MG PO TABS: 10.0000 mg | ORAL_TABLET | Freq: Every day | ORAL | 3 refills | Status: AC

## 2024-07-23 NOTE — Assessment & Plan Note (Signed)
 EF improved from 25% to 35-40% post-CRT-D implantation.  She is NYHA class IIb-III. She notes shortness of breath the day prior to taking Lasix . She notes significant diuresis with Lasix  40 mg. We discussed trying to take Lasix  20 mg three times a week to see if she can tolerate this. She had a recent syncopal episode. This sounds c/w vasovagal syncope. She likely had orthostatic hypotension after standing. Her device was interrogated after this and she had no VT/VF episode or ATP delivered. Adjusting her diuretic may help alleviate drops in BP with standing. She notes her insurance may not cover Entresto  next year.  - Change furosemide  to 20 mg three times a week. - Continue carvedilol  3.125 mg oral twice daily  - Continue Entresto  24/26 mg oral twice daily  - Continue spironolactone  25 mg oral daily. - If she has insurance issues with Entresto , will consider switching to valsartan . - As far as knee surgery goes, she will be at at least moderate risk for any procedure regardless.  If she is stable at next clinic visit, we can likely clear her to proceed.

## 2024-07-23 NOTE — Progress Notes (Signed)
 OFFICE NOTE:    Date:  07/24/2024  ID:  Patricia Fischer, DOB 1955-11-27, MRN [id number] PCP: Leavy Mole, PA-C  [facility] Providers Cardiologist:  Oneil Parchment, MD Cardiology APP:  Lelon Glendia DASEN, PA-C  Electrophysiologist:  Fonda Kitty, MD  Electrophysiology APP:  Riddle, Suzann, NP        (HFrEF) heart failure with reduced ejection fraction  Non-ischemic cardiomyopathy Cath 10/28/15: oLCx 50 - prob spasm  Hx of intol of Entresto  (ortho hypotension) Intol of SGLT2i 2/2 UTIs Previously followed by Dr. Cherrie in [facility] Variable EF over the years: 25-40  cMRI 12/18: EF 52, no LGE CPET 08/10/2021: Main limitation ventilatory related to obesity and restrictive lung physiology; elevated VE/VCO2 suggest mild HF limitation Myoview  07/17/2023: Inferior and apical infarct with apical peri-infarct ischemia, EF 25, intermediate risk Monitor 12/2023: NSR w BBB.  NSVT x 7 beats; PVCs 2.1% (monitor 1) 5% (monitor 2)  TTE 07/03/24: EF 35-40, no RWMA, Gr 1 DD, NL RVSF, mild MR S/p CRT-D Hx of recurrent pulmonary embolism  On chronic anticoagulation - Rx w/ Rivaroxaban  Secondary pulmonary hypertension  Breast CA Hypertension  Left Bundle Branch Block Hx of DVT and pulmonary embolism in 2016 COPD        Discussed the use of AI scribe software for clinical note transcription with the patient, who gave verbal consent to proceed. History of Present Illness Patricia Fischer is a 68 year old female with heart failure with reduced ejection fraction who presents for a six-month follow-up.  She experiences persistent fatigue but notes some improvement in her breathing and ability to walk without significant dyspnea. She takes furosemide  40 mg twice a week, which effectively manages fluid retention. Approximately three to four weeks ago, she experienced a syncopal episode. She had gotten up and walked to the kitchen. She lost consciousness but did not injure herself. She has a  history of orthostatic symptoms, including dizziness upon standing. She denies recent chest pain. She has had falls due to her knee giving out, unrelated to her syncopal episode. She is considering knee surgery.      ROS-See HPI    Studies Reviewed:       LABS 06/05/24: K 4.4, SCr 1.34, eGFR 43, ALT 13, Hgb 14, PLT 121K, A1c 5.9 06/05/24: TC 112, Trig 130, HDL 48, LDL 43         Physical Exam:  VS:  BP 132/64   Pulse 80   Ht 5' 4 (1.626 m)   Wt 219 lb 6.4 oz (99.5 kg)   SpO2 94%   BMI 37.66 kg/m        Wt Readings from Last 3 Encounters:  07/24/24 219 lb 6.4 oz (99.5 kg)  06/05/24 221 lb (100.2 kg)  05/28/24 225 lb 6.4 oz (102.2 kg)    Constitutional:      Appearance: Healthy appearance. Not in distress.  Neck:     Vascular: No JVR. JVD normal.  Pulmonary:     Breath sounds: Normal breath sounds. No wheezing. No rales.  Cardiovascular:     Normal rate. Regular rhythm.     Murmurs: There is no murmur.  Edema:    Peripheral edema absent.  Abdominal:     Palpations: Abdomen is soft.       Assessment and Plan:    Assessment & Plan HFrEF (heart failure with reduced ejection fraction) (HCC) EF improved from 25% to 35-40% post-CRT-D implantation.  She is NYHA class IIb-III. She notes  shortness of breath the day prior to taking Lasix . She notes significant diuresis with Lasix  40 mg. We discussed trying to take Lasix  20 mg three times a week to see if she can tolerate this. She had a recent syncopal episode. This sounds c/w vasovagal syncope. She likely had orthostatic hypotension after standing. Her device was interrogated after this and she had no VT/VF episode or ATP delivered. Adjusting her diuretic may help alleviate drops in BP with standing. She notes her insurance may not cover Entresto  next year.  - Change furosemide  to 20 mg three times a week. - Continue carvedilol  3.125 mg oral twice daily  - Continue Entresto  24/26 mg oral twice daily  - Continue spironolactone   25 mg oral daily. - If she has insurance issues with Entresto , will consider switching to valsartan . - As far as knee surgery goes, she will be at at least moderate risk for any procedure regardless.  If she is stable at next clinic visit, we can likely clear her to proceed. History of pulmonary embolism On chronic anticoagulation with Xarelto  due to pulmonary embolism and DVT.   Essential hypertension BP controlled. See discussion regarding medications under HFrEF. Biventricular ICD (implantable cardioverter-defibrillator) in place She notes improved symptoms since biventricular pacemaker implantation.  Continue follow up with EP as planned.  Syncope and collapse Recent syncope episode likely due to vasovagal syncope and orthostatic hypotension.  A blood pressure was recorded at 99/45 mmHg around the time of the episode. No VT/VF or ATP detected on recent device interrogation.  She has a history of orthostatic dizziness and falls.  - Adjust Lasix  as outlined above. - I have advised her to use compression hose  - Monitor blood pressure and symptoms, consider reducing spironolactone  if hypotension          Dispo:  Return in about 6 months (around 01/21/2025) for Routine Follow Up, w/ Dr. Jeffrie.  Signed, Glendia Ferrier, PA-C

## 2024-07-23 NOTE — Assessment & Plan Note (Signed)
 On chronic anticoagulation with Xarelto  due to pulmonary embolism and DVT. ***

## 2024-07-24 ENCOUNTER — Ambulatory Visit: Attending: Physician Assistant | Admitting: Physician Assistant

## 2024-07-24 ENCOUNTER — Encounter: Payer: Self-pay | Admitting: Physician Assistant

## 2024-07-24 VITALS — BP 132/64 | HR 80 | Ht 64.0 in | Wt 219.4 lb

## 2024-07-24 DIAGNOSIS — I5020 Unspecified systolic (congestive) heart failure: Secondary | ICD-10-CM | POA: Diagnosis not present

## 2024-07-24 DIAGNOSIS — I10 Essential (primary) hypertension: Secondary | ICD-10-CM

## 2024-07-24 DIAGNOSIS — Z86711 Personal history of pulmonary embolism: Secondary | ICD-10-CM | POA: Diagnosis not present

## 2024-07-24 DIAGNOSIS — R55 Syncope and collapse: Secondary | ICD-10-CM

## 2024-07-24 DIAGNOSIS — Z95810 Presence of automatic (implantable) cardiac defibrillator: Secondary | ICD-10-CM

## 2024-07-24 MED ORDER — FUROSEMIDE 20 MG PO TABS: 20.0000 mg | ORAL_TABLET | ORAL | 3 refills | Status: AC

## 2024-07-24 MED ORDER — FUROSEMIDE 40 MG PO TABS: 20.0000 mg | ORAL_TABLET | ORAL | Status: DC

## 2024-07-24 NOTE — Patient Instructions (Addendum)
 Thank you for choosing [facility]!     Medication Instructions:  Take Lasix  20mg . Take one tablet 3 times a week.  Discuss with your insurance company about the coverage of Entresto    *If you need a refill on your cardiac medications before your next appointment, please call your pharmacy*   Lab Work: No labs were ordered during today's visit.  If you have labs (blood work) drawn today and your tests are completely normal, you will receive your results only by: MyChart Message (if you have MyChart) OR A paper copy in the mail If you have any lab test that is abnormal or we need to change your treatment, we will call you to review the results.   Testing/Procedures: No procedures were ordered during today's visit.   Your next appointment:   6 month(s)   Provider:   Oneil Parchment, MD     Follow-Up: At [facility], you and your health needs are our priority.  As part of our continuing mission to provide you with exceptional heart care, we have created designated Provider Care Teams.  These Care Teams include your primary Cardiologist (physician) and Advanced Practice Providers (APPs -  Physician Assistants and Nurse Practitioners) who all work together to provide you with the care you need, when you need it. We recommend signing up for the patient portal called MyChart.  Sign up information is provided on this After Visit Summary.  MyChart is used to connect with patients for Virtual Visits (Telemedicine).  Patients are able to view lab/test results, encounter notes, upcoming appointments, etc.  Non-urgent messages can be sent to your provider as well.   To learn more about what you can do with MyChart, go to [email or web address].

## 2024-08-15 ENCOUNTER — Ambulatory Visit
Admission: EM | Admit: 2024-08-15 | Discharge: 2024-08-15 | Disposition: A | Discharge status: Home / Self Care | Attending: Emergency Medicine | Admitting: Emergency Medicine

## 2024-08-15 ENCOUNTER — Encounter: Payer: Self-pay | Admitting: Emergency Medicine

## 2024-08-15 DIAGNOSIS — J441 Chronic obstructive pulmonary disease with (acute) exacerbation: Secondary | ICD-10-CM | POA: Diagnosis not present

## 2024-08-15 MED ORDER — HYDROCOD POLI-CHLORPHE POLI ER 10-8 MG/5ML PO SUER: 5.0000 mL | Freq: Two times a day (BID) | ORAL | 0 refills | Status: AC | PRN

## 2024-08-15 NOTE — ED Triage Notes (Signed)
 Patient reports cough  x 6 days. Patient states it started after a dental procedure 08-07-24. Patient denies sore throat at this time. Patient has used mucinex  with no relief.

## 2024-08-15 NOTE — Discharge Instructions (Signed)
 Do believe you are having a flareup of your COPD  Take azithromycin  as directed for bacterial coverage  You may use cough syrup twice daily as needed, be mindful can make you drowsy    You can take Tylenol  and/or Ibuprofen as needed for fever reduction and pain relief.   For cough: honey 1/2 to 1 teaspoon (you can dilute the honey in water or another fluid).  You can also use guaifenesin  and dextromethorphan for cough. You can use a humidifier for chest congestion and cough.  If you don't have a humidifier, you can sit in the bathroom with the hot shower running.      For sore throat: try warm salt water gargles, cepacol lozenges, throat spray, warm tea or water with lemon/honey, popsicles or ice, or OTC cold relief medicine for throat discomfort.   For congestion: take a daily anti-histamine like Zyrtec, Claritin , and a oral decongestant, such as pseudoephedrine .  You can also use Flonase  1-2 sprays in each nostril daily.   It is important to stay hydrated: drink plenty of fluids (water, gatorade/powerade/pedialyte, juices, or teas) to keep your throat moisturized and help further relieve irritation/discomfort.

## 2024-08-15 NOTE — ED Provider Notes (Signed)
 CAY RALPH PELT    CSN: [id number] Arrival date & time: 08/15/24  0910      History   Chief Complaint Chief Complaint  Patient presents with   Cough    HPI DEVEN AUDI is a 68 y.o. female.   Patient presents for evaluation of nasal congestion, sinus pain and pressure to the bilateral cheeks, sore throat, productive cough and constant headache present for 6 days.  Sore throat has resolved.  Associated fever peaking at 100.  Endorses symptoms began after dental procedure on 08/07/2024 which she has concerns in which her sinus was affected by this.  Has attempted use of warm compresses, tequila and inhalers and Mucinex .  History of COPD.     Past Medical History:  Diagnosis Date   Allergy    Anxiety    Arthritis    Asthma    Blood transfusion 1979   Chidbirth   Breast cancer, left breast (HCC) 2011   DCIS   Chronic systolic CHF w/ Improved LVF 05/05/2015   NICM // Dx 04/2015 - EF initially 25-30% with RV dysfunction // f/u echo 08/2015 technically difficult, EF 35-40%, anterior, anteroseptal, apical and inferoapical severe hypokinesis suggestive of LAD territory ischemia/infarct, grade 1 DD, mild MR, RV normal. // Echo 5/18: mild LVH, EF 30-35, Gr 1 DD // cMRI 12/18: EF 52    COPD (chronic obstructive pulmonary disease) (HCC)    Depression    DVT (deep venous thrombosis) (HCC) 04/2015   a. Dx 04/2015 - patient returned 05/2015 after noncompliance with Xarelto .   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 Dx 04/2015 - patient returned 05/2015 after noncompliance with Xarelto .   Shortness of breath dyspnea    Subscapularis (muscle) sprain 11/22/2013   Syncope    a. Recurrent syncope in 2016 felt 2/2 orthostasis in setting of PE.    Patient Active Problem List   Diagnosis Date Noted   Presence of heart assist device (HCC) 06/05/2024   Systolic heart failure (HCC) 02/27/2024   Chronic systolic heart failure (HCC) 02/26/2024   Positive colorectal cancer screening using Cologuard test 03/16/2023   Pain of right shoulder joint on movement 08/04/2022   Osteoarthritis of knee 03/17/2022   Claustrophobia 02/09/2022   Pain in joint of left knee 12/21/2021   Derangement of left knee 12/21/2021   Chronic obstructive pulmonary disease (HCC) 01/22/2020   Stress incontinence 12/27/2019   Hyperlipidemia 12/27/2019   Muscle spasms of lower extremity 12/27/2019   Class 2 severe obesity with serious comorbidity and body mass index (BMI) of 39.0 to 39.9 in adult 07/12/2017   Allergic rhinitis 06/26/2017   Insomnia 06/26/2017   Hematuria 07/27/2016   Degenerative joint disease (DJD) of hip 03/31/2016   Hx of breast cancer 03/22/2016   History of pulmonary embolism 10/27/2015   HTN (hypertension) 10/07/2015   Depression, major, recurrent, moderate (HCC) 06/11/2015   Anemia 05/15/2015  Do not resuscitate status 05/15/2015   HFrEF (heart failure with reduced ejection fraction) (HCC) 05/05/2015   Left leg DVT (HCC) 05/05/2015   Pulmonary hypertension (HCC)    Saddle pulmonary embolus (HCC)    LBBB (left bundle branch block)-rate related  04/19/2015   Chronic migraine without aura, with intractable migraine, so stated, with status migrainosus 11/04/2014    Past Surgical History:  Procedure Laterality Date   ABDOMINAL HYSTERECTOMY  1986   ANAL FISSURE REPAIR     ANTERIOR CERVICAL DECOMP/DISCECTOMY FUSION  01/23/2012   Procedure: ANTERIOR CERVICAL DECOMPRESSION/DISCECTOMY FUSION 1 LEVEL;  Surgeon:  Arley SHAUNNA Helling, MD;  Location: [facility];  Service: Neurosurgery;  Laterality: N/A;  Cervical five-six anterior cervical discectomy fusion with plating   APPENDECTOMY     BIV ICD INSERTION CRT-D N/A 02/26/2024   Procedure: BIV ICD INSERTION CRT-D;  Surgeon: Kennyth Chew, MD;  Location: [facility] INVASIVE CV LAB;  Service: Cardiovascular;  Laterality: N/A;   BREAST BIOPSY Left 06/2016   US  biopsy-PAPILLARY CYSTIC APOCRINE CHANGE    BREAST SURGERY Left 2011   Breast lumpectomy   CARDIAC CATHETERIZATION  2010   [facility]; pt states it was clear   CARDIAC CATHETERIZATION N/A 10/28/2015   Procedure: Right/Left Heart Cath and Coronary Angiography;  Surgeon: Victory LELON Sharps, MD;  Location: [facility] INVASIVE CV LAB;  Service: Cardiovascular;  Laterality: N/A;   CARPAL TUNNEL RELEASE Bilateral    CESAREAN SECTION  1982, 1979   CHOLECYSTECTOMY     COLONOSCOPY  01/16/2009   Dr Dessa   KNEE CARTILAGE SURGERY Left    LEAD INSERTION N/A 02/26/2024   Procedure: LEAD INSERTION;  Surgeon: Kennyth Chew, MD;  Location: [facility] INVASIVE CV LAB;  Service: Cardiovascular;  Laterality: N/A;   SHOULDER ARTHROSCOPY WITH SUBACROMIAL DECOMPRESSION, ROTATOR CUFF REPAIR AND BICEP TENDON REPAIR Right 11/22/2013   Procedure: RIGHT SHOULDER ATHROSCOPY OPEN SUBSCAP REPAIR DELTA-PECTORAL ;  Surgeon: Elspeth JONELLE Her, MD;  Location: [facility] OR;  Service: Orthopedics;  Laterality: Right;   TARSAL TUNNEL RELEASE Left 1990   TONSILLECTOMY     TUBAL LIGATION      OB History   No obstetric history on file.      Home Medications    Prior to Admission medications  Medication Sig Start Date End Date Taking? Authorizing Provider  albuterol  (VENTOLIN  HFA) 108 (90 Base) MCG/ACT inhaler TAKE 2 PUFFS BY MOUTH EVERY 6 HOURS AS NEEDED FOR WHEEZE OR SHORTNESS OF BREATH 12/25/23   Tapia, Leisa, PA-C  busPIRone  (BUSPAR ) 5 MG tablet Take 1-3 tablets (5-15 mg total) by mouth 2 (two) times daily as needed (anxiety). 06/05/24   Tapia, Leisa, PA-C   carvedilol  (COREG ) 6.25 MG tablet Take 0.5 tablets (3.125 mg total) by mouth 2 (two) times daily. 03/12/24   Lelon Hamilton T, PA-C  cetirizine (ZYRTEC) 10 MG tablet Take 10 mg by mouth daily.    [provider]  ENTRESTO  24-26 MG TAKE 1 TABLET BY MOUTH TWICE A DAY 07/12/24   Jeffrie Oneil BROCKS, MD  fluticasone  (FLONASE ) 50 MCG/ACT nasal spray SPRAY 2 SPRAYS INTO EACH NOSTRIL EVERY DAY 10/31/22   Tapia, Leisa, PA-C  Fluticasone -Umeclidin-Vilant (TRELEGY ELLIPTA ) 100-62.5-25 MCG/ACT AEPB Inhale 1 puff into the lungs daily. 06/05/24   Tapia, Leisa, PA-C  furosemide  (LASIX ) 20 MG tablet Take 1 tablet (20 mg total) by mouth 3 (three) times a week. 07/24/24 10/22/24  Lelon Hamilton T, PA-C  gabapentin  (NEURONTIN ) 300 MG capsule Take 300 mg by mouth 3 (three) times daily  as needed (nerve pain).    [provider]  Gabapentin , Once-Daily, 900 MG TABS Take by mouth.    [provider]  LORazepam  (ATIVAN ) 1 MG tablet Take 1 tablet (1 mg total) by mouth once as needed for up to 1 dose for anxiety (for procedural anxiety - take one hour prior to appt) 06/05/24   Tapia, Leisa, PA-C  methocarbamol  (ROBAXIN ) 500 MG tablet Take 500 mg by mouth every 8 (eight) hours as needed for muscle spasms. 01/26/24   [provider]  rivaroxaban  (XARELTO ) 10 MG TABS tablet Take 1 tablet (10 mg total) by mouth daily. 07/19/24   Jeffrie Oneil BROCKS, MD  rosuvastatin  (CRESTOR ) 20 MG tablet TAKE 1 TABLET BY MOUTH EVERY DAY 05/27/24   Lelon Hamilton T, PA-C  spironolactone  (ALDACTONE ) 25 MG tablet TAKE 1 TABLET (25 MG TOTAL) BY MOUTH DAILY. 03/13/24   Jeffrie Oneil BROCKS, MD  tizanidine  (ZANAFLEX ) 2 MG capsule Take 2 mg by mouth 3 (three) times daily as needed for muscle spasms.    [provider]  topiramate  (TOPAMAX ) 25 MG tablet Take 25 mg by mouth daily.    [provider]  traMADol  (ULTRAM ) 50 MG tablet Take 50 mg by mouth every 4 (four) hours as needed for severe pain (pain score 7-10). 02/15/22    [provider]  vitamin B-12 (CYANOCOBALAMIN ) 500 MCG tablet Take 2,500 mcg by mouth daily.    [provider]  vitamin C  (ASCORBIC ACID ) 500 MG tablet Take 500 mg by mouth daily.    [provider]    Family History Family History  Problem Relation Age of Onset   Breast cancer Paternal Aunt    Breast cancer Mother 27   Migraines Mother    Cancer Mother    Hypertension Mother    Diabetes Mother    Rectal cancer Father    Cancer Father    COPD Father    Alcohol abuse Father    Heart disease Paternal Grandmother    Stroke Paternal Grandmother    Heart attack Paternal Grandmother    Aneurysm Paternal Grandfather    COPD Paternal Grandfather    Breast cancer Cousin    Breast cancer Cousin    Breast cancer Cousin    Diabetes Sister    Hearing loss Son    Anesthesia problems Neg Hx     Social History Social History[1]   Allergies   Gadolinium derivatives, Levaquin  [levofloxacin  in d5w], Sulfa antibiotics, Vancomycin , Abilify  [aripiprazole ], Levofloxacin , Codeine, Oxycodone , and Penicillins   Review of Systems Review of Systems  Constitutional: Negative.   HENT:  Positive for congestion, sinus pressure, sinus pain and sore throat. Negative for dental problem, drooling, ear discharge, ear pain, facial swelling, hearing loss, mouth sores, nosebleeds, postnasal drip, rhinorrhea, sneezing, tinnitus, trouble swallowing and voice change.   Respiratory:  Positive for cough. Negative for apnea, choking, chest tightness, shortness of breath, wheezing and stridor.   Neurological:  Positive for headaches. Negative for dizziness, tremors, seizures, syncope, facial asymmetry, speech difficulty, weakness, light-headedness and numbness.     Physical Exam Triage Vital Signs ED Triage Vitals [08/15/24 1007]  Encounter Vitals Group     BP 135/82     Girls Systolic BP Percentile      Girls Diastolic BP Percentile      Boys Systolic BP Percentile      Boys  Diastolic BP Percentile      Pulse Rate (!) 102     Resp (!) 21  Temp 98.6 F (37 C)     Temp Source Oral     SpO2 97 %     Weight      Height      Head Circumference      Peak Flow      Pain Score 0     Pain Loc      Pain Education      Exclude from Growth Chart    No data found.  Updated Vital Signs BP 135/82 (BP Location: Left Arm)   Pulse (!) 102   Temp 98.6 F (37 C) (Oral)   Resp (!) 21   SpO2 97%   Visual Acuity Right Eye Distance:   Left Eye Distance:   Bilateral Distance:    Right Eye Near:   Left Eye Near:    Bilateral Near:     Physical Exam Constitutional:      Appearance: Normal appearance.  HENT:     Head: Normocephalic.     Right Ear: Tympanic membrane, ear canal and external ear normal.     Left Ear: Tympanic membrane, ear canal and external ear normal.     Nose: Congestion present.     Mouth/Throat:     Pharynx: No oropharyngeal exudate or posterior oropharyngeal erythema.  Eyes:     Extraocular Movements: Extraocular movements intact.  Cardiovascular:     Rate and Rhythm: Normal rate and regular rhythm.     Pulses: Normal pulses.     Heart sounds: Normal heart sounds.  Pulmonary:     Effort: Pulmonary effort is normal.     Breath sounds: Wheezing present.  Neurological:     Mental Status: She is alert and oriented to person, place, and time. Mental status is at baseline.      UC Treatments / Results  Labs (all labs ordered are listed, but only abnormal results are displayed) Labs Reviewed - No data to display  EKG   Radiology No results found.  Procedures Procedures (including critical care time)  Medications Ordered in UC Medications - No data to display  Initial Impression / Assessment and Plan / UC Course  I have reviewed the triage vital signs and the nursing notes.  Pertinent labs & imaging results that were available during my care of the patient were reviewed by me and considered in my medical decision making  (see chart for details).  COPD exacerbation  Vitals are stable, O2 saturation 97% on room air, wheezing heard to auscultation, patient in no signs of distress nontoxic-appearing however persistent cough witnessed, viral testing deferred due to timeline.  Prescribed azithromycin  and Tussionex, PDMP reviewed, low risk, patient endorses that she was told by cardiologist that she is currently unable to use steroids due to placement of defibrillator, advised continued use of inhalers and to notify her PCP and cardiologist if symptoms continue to persist for further evaluation and management Final Clinical Impressions(s) / UC Diagnoses   Final diagnoses:  None   Discharge Instructions   None    ED Prescriptions   None    PDMP not reviewed this encounter.     [1]  Social History Tobacco Use   Smoking status: Never   Smokeless tobacco: Never  Vaping Use   Vaping status: Never Used  Substance Use Topics   Alcohol use: Not Currently    Comment: occasional wine   Drug use: Never     Teresa Shelba SAUNDERS, NP 08/15/24 1108

## 2024-08-21 ENCOUNTER — Ambulatory Visit

## 2024-08-21 ENCOUNTER — Telehealth: Payer: Self-pay

## 2024-08-21 NOTE — Telephone Encounter (Signed)
 Spoke w/ patient regarding recent device alert for Bi-V pacing and HF diagnostics currently abnormal w/ upward trend.   Patient states she currently has Bronchitis and has not been taking her fluid pills because she is not able to continually get up and go to the bathroom. States she finished antibiotics on Monday and was not previously prescribed Prednisone  stating someone from Cardiology stated it was contraindicated. Unable to find anything from Cardiology stating prednisone  is contraindicated. Advised patient to F/U w/ PCP regarding prescription for Prednisone .   Patient aware and verbalized understanding to resume fluid pills as prescribed and F/U w/ PCP regarding Prednisone  prescription for Bronchitis.   Will continue to monitor and update accordingly.

## 2024-08-21 NOTE — Telephone Encounter (Signed)
 Follow up transmission received to reevaluate BIV pacing and fluid index.  BiV pacing 84.5%, HF diagnostics currently abnormal with upward trend.  Will contact Pt to evaluate for symptoms.  Pt with recent hospitalization for illness.

## 2024-09-06 ENCOUNTER — Ambulatory Visit: Admitting: Family Medicine

## 2024-10-09 ENCOUNTER — Encounter

## 2024-10-09 LAB — CUP PACEART REMOTE DEVICE CHECK
Battery Remaining Longevity: 103 mo
Battery Voltage: 3 V
Brady Statistic RV Percent Paced: 91.42 %
Date Time Interrogation Session: 20260204045729
HighPow Impedance: 73 Ohm
Implantable Lead Connection Status: 753985
Implantable Lead Connection Status: 753985
Implantable Lead Connection Status: 753985
Implantable Lead Implant Date: 20250623
Implantable Lead Implant Date: 20250623
Implantable Lead Implant Date: 20250623
Implantable Lead Location: 753858
Implantable Lead Location: 753859
Implantable Lead Location: 753860
Implantable Lead Model: 4598
Implantable Lead Model: 5076
Implantable Pulse Generator Implant Date: 20250623
Lead Channel Impedance Value: 1121 Ohm
Lead Channel Impedance Value: 1197 Ohm
Lead Channel Impedance Value: 1273 Ohm
Lead Channel Impedance Value: 380 Ohm
Lead Channel Impedance Value: 437 Ohm
Lead Channel Impedance Value: 437 Ohm
Lead Channel Impedance Value: 475 Ohm
Lead Channel Impedance Value: 532 Ohm
Lead Channel Impedance Value: 551 Ohm
Lead Channel Impedance Value: 665 Ohm
Lead Channel Impedance Value: 760 Ohm
Lead Channel Impedance Value: 817 Ohm
Lead Channel Impedance Value: 874 Ohm
Lead Channel Pacing Threshold Amplitude: 0.625 V
Lead Channel Pacing Threshold Amplitude: 0.625 V
Lead Channel Pacing Threshold Amplitude: 2.125 V
Lead Channel Pacing Threshold Pulse Width: 0.4 ms
Lead Channel Pacing Threshold Pulse Width: 0.4 ms
Lead Channel Pacing Threshold Pulse Width: 0.4 ms
Lead Channel Sensing Intrinsic Amplitude: 19.9 mV
Lead Channel Sensing Intrinsic Amplitude: 3.5 mV
Lead Channel Setting Pacing Amplitude: 1.5 V
Lead Channel Setting Pacing Amplitude: 2 V
Lead Channel Setting Pacing Amplitude: 2.75 V
Lead Channel Setting Pacing Pulse Width: 0.4 ms
Lead Channel Setting Pacing Pulse Width: 0.4 ms
Lead Channel Setting Sensing Sensitivity: 0.3 mV
Zone Setting Status: 755011
Zone Setting Status: 755011
Zone Setting Status: 755011

## 2024-11-26 ENCOUNTER — Encounter: Admitting: Internal Medicine

## 2024-12-04 ENCOUNTER — Ambulatory Visit: Admitting: Family Medicine

## 2025-01-08 ENCOUNTER — Encounter

## 2025-04-09 ENCOUNTER — Encounter

## 2025-04-10 ENCOUNTER — Ambulatory Visit

## 2025-07-09 ENCOUNTER — Encounter

## 2025-10-08 ENCOUNTER — Encounter

## 2026-01-07 ENCOUNTER — Encounter
